# Patient Record
Sex: Male | Born: 1950
Health system: Southern US, Community
[De-identification: ages and names within clinical notes are randomized; demographics above are authoritative.]

## PROBLEM LIST (undated history)

## (undated) DIAGNOSIS — E870 Hyperosmolality and hypernatremia: Secondary | ICD-10-CM

## (undated) DIAGNOSIS — N401 Enlarged prostate with lower urinary tract symptoms: Secondary | ICD-10-CM

## (undated) DIAGNOSIS — E876 Hypokalemia: Secondary | ICD-10-CM

## (undated) DIAGNOSIS — E785 Hyperlipidemia, unspecified: Secondary | ICD-10-CM

## (undated) DIAGNOSIS — N200 Calculus of kidney: Secondary | ICD-10-CM

## (undated) DIAGNOSIS — H3530 Unspecified macular degeneration: Secondary | ICD-10-CM

## (undated) DIAGNOSIS — Z87442 Personal history of urinary calculi: Secondary | ICD-10-CM

## (undated) DIAGNOSIS — Q644 Malformation of urachus: Secondary | ICD-10-CM

## (undated) DIAGNOSIS — B029 Zoster without complications: Secondary | ICD-10-CM

## (undated) DIAGNOSIS — N138 Other obstructive and reflux uropathy: Secondary | ICD-10-CM

## (undated) DIAGNOSIS — E43 Unspecified severe protein-calorie malnutrition: Secondary | ICD-10-CM

## (undated) DIAGNOSIS — F32A Depression, unspecified: Secondary | ICD-10-CM

## (undated) DIAGNOSIS — N1330 Unspecified hydronephrosis: Secondary | ICD-10-CM

## (undated) DIAGNOSIS — D696 Thrombocytopenia, unspecified: Secondary | ICD-10-CM

## (undated) DIAGNOSIS — G039 Meningitis, unspecified: Secondary | ICD-10-CM

## (undated) DIAGNOSIS — F429 Obsessive-compulsive disorder, unspecified: Secondary | ICD-10-CM

## (undated) DIAGNOSIS — E119 Type 2 diabetes mellitus without complications: Secondary | ICD-10-CM

## (undated) HISTORY — DX: Obsessive-compulsive disorder, unspecified: F42.9

## (undated) HISTORY — DX: Zoster without complications: B02.9

## (undated) HISTORY — DX: Calculus of kidney: N20.0

## (undated) HISTORY — DX: Meningitis, unspecified: G03.9

## (undated) HISTORY — PX: KIDNEY STONE SURGERY: SHX686

---

## 2004-01-11 ENCOUNTER — Other Ambulatory Visit: Payer: Self-pay

## 2013-12-01 ENCOUNTER — Observation Stay: Payer: Self-pay | Admitting: Internal Medicine

## 2013-12-01 LAB — BASIC METABOLIC PANEL
ANION GAP: 6 — AB (ref 7–16)
BUN: 16 mg/dL (ref 7–18)
CALCIUM: 8.8 mg/dL (ref 8.5–10.1)
Chloride: 106 mmol/L (ref 98–107)
Co2: 27 mmol/L (ref 21–32)
Creatinine: 1.17 mg/dL (ref 0.60–1.30)
EGFR (African American): >60
Glucose: 146 mg/dL — ABNORMAL HIGH (ref 65–99)
Osmolality: 281 (ref 275–301)
Potassium: 3.7 mmol/L (ref 3.5–5.1)
Sodium: 139 mmol/L (ref 136–145)

## 2013-12-01 LAB — CBC
HCT: 47.6 % (ref 40.0–52.0)
HGB: 16.7 g/dL (ref 13.0–18.0)
MCH: 32 pg (ref 26.0–34.0)
MCHC: 35.2 g/dL (ref 32.0–36.0)
MCV: 91 fL (ref 80–100)
PLATELETS: 138 10*3/uL — AB (ref 150–440)
RBC: 5.23 10*6/uL (ref 4.40–5.90)
RDW: 13.2 % (ref 11.5–14.5)
WBC: 7.9 10*3/uL (ref 3.8–10.6)
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
## 2015-08-04 ENCOUNTER — Ambulatory Visit: Payer: Self-pay | Admitting: Unknown Physician Specialty

## 2015-09-07 ENCOUNTER — Ambulatory Visit (INDEPENDENT_AMBULATORY_CARE_PROVIDER_SITE_OTHER): Payer: No Typology Code available for payment source | Admitting: Unknown Physician Specialty

## 2015-09-07 ENCOUNTER — Encounter: Payer: Self-pay | Admitting: Unknown Physician Specialty

## 2015-09-07 VITALS — BP 135/91 | HR 90 | Temp 97.3°F | Ht 69.4 in | Wt 206.8 lb

## 2015-09-07 DIAGNOSIS — G4700 Insomnia, unspecified: Secondary | ICD-10-CM | POA: Diagnosis not present

## 2015-09-07 MED ORDER — TRAZODONE HCL 50 MG PO TABS: 25.0000 mg | ORAL_TABLET | Freq: Every evening | ORAL | Status: DC | PRN

## 2015-09-07 NOTE — Assessment & Plan Note (Signed)
Pt states his biggest issue is lack of sleep.  Therefore, we will prescribe Trazadone 50 mg to start at 1/2 QHS and may increase to 50 mg.

## 2015-09-07 NOTE — Progress Notes (Signed)
BP 135/91 mmHg  Pulse 90  Temp(Src) 97.3 F (36.3 C)  Ht 5' 9.4" (1.763 m)  Wt 206 lb 12.8 oz (93.804 kg)  BMI 30.18 kg/m2  SpO2 96%   Subjective:    Patient ID: Dillon [name], male    DOB: May 13, 1951, 64 y.o.   MRN: [id number]  HPI: Dillon [name] is a 64 y.o. male  Chief Complaint  Patient presents with  . Establish Care   Pt is here as a "new patient" as he "let my time run out."  He is having some issues with depression and states he has OCD.  He is struggling with grief issues related to his wife recently passing.  Review of previous notes show that he was taking Citalopram daily 20 mgs.  He has taken Clonazepam in the past for anxiety attacks.  He has the occasional anxiety attack. He would like to be prescribed that for occasional use.    Depression screen PHQ 2/9 09/07/2015  Decreased Interest 3  Down, Depressed, Hopeless 2  PHQ - 2 Score 5  Altered sleeping 3  Tired, decreased energy 3  Change in appetite 0  Feeling bad or failure about yourself  2  Trouble concentrating 0  Moving slowly or fidgety/restless 0  Suicidal thoughts 0  PHQ-9 Score 13     Relevant past medical, surgical, family and social history reviewed and updated as indicated. Interim medical history since our last visit reviewed. Allergies and medications reviewed and updated.  Review of Systems  Constitutional: Negative.   HENT: Negative.   Eyes: Negative.   Respiratory: Negative.   Cardiovascular: Negative.   Gastrointestinal: Negative.   Endocrine: Negative.   Genitourinary: Negative.   Skin: Negative.   Allergic/Immunologic: Negative.   Neurological: Negative.   Hematological: Negative.   Psychiatric/Behavioral: Positive for sleep disturbance.    Per HPI unless specifically indicated above     Objective:    BP 135/91 mmHg  Pulse 90  Temp(Src) 97.3 F (36.3 C)  Ht 5' 9.4" (1.763 m)  Wt 206 lb 12.8 oz (93.804 kg)  BMI 30.18 kg/m2  SpO2 96%  Wt Readings from Last 3  Encounters:  09/07/15 206 lb 12.8 oz (93.804 kg)    Physical Exam  Constitutional: He is oriented to person, place, and time. He appears well-developed and well-nourished. No distress.  HENT:  Head: Normocephalic and atraumatic.  Eyes: Conjunctivae and lids are normal. Right eye exhibits no discharge. Left eye exhibits no discharge. No scleral icterus.  Cardiovascular: Normal rate, regular rhythm and normal heart sounds.   Pulmonary/Chest: Effort normal and breath sounds normal. No respiratory distress.  Abdominal: Normal appearance. He exhibits no distension. There is no splenomegaly or hepatomegaly. There is no tenderness.  Musculoskeletal: Normal range of motion.  Neurological: He is alert and oriented to person, place, and time.  Skin: Skin is intact. No rash noted. No pallor.  Psychiatric: He has a normal mood and affect. His behavior is normal. Judgment and thought content normal.  Nursing note and vitals reviewed.    Assessment & Plan:   Problem List Items Addressed This Visit      Unprioritized   Insomnia - Primary    Pt states his biggest issue is lack of sleep.  Therefore, we will prescribe Trazadone 50 mg to start at 1/2 QHS and may increase to 50 mg.          Refusing physical for now   Follow up plan: Return in about 3  months (around 12/08/2015).

## 2015-09-18 ENCOUNTER — Ambulatory Visit: Payer: No Typology Code available for payment source | Admitting: Unknown Physician Specialty

## 2015-10-23 DIAGNOSIS — N200 Calculus of kidney: Secondary | ICD-10-CM | POA: Insufficient documentation

## 2015-10-23 DIAGNOSIS — N23 Unspecified renal colic: Secondary | ICD-10-CM | POA: Insufficient documentation

## 2015-10-23 DIAGNOSIS — N201 Calculus of ureter: Secondary | ICD-10-CM | POA: Insufficient documentation

## 2015-10-23 DIAGNOSIS — Q644 Malformation of urachus: Secondary | ICD-10-CM | POA: Insufficient documentation

## 2015-10-23 DIAGNOSIS — R310 Gross hematuria: Secondary | ICD-10-CM | POA: Insufficient documentation

## 2015-10-25 DIAGNOSIS — N210 Calculus in bladder: Secondary | ICD-10-CM | POA: Insufficient documentation

## 2015-11-13 DIAGNOSIS — R600 Localized edema: Secondary | ICD-10-CM | POA: Insufficient documentation

## 2015-11-24 DIAGNOSIS — N401 Enlarged prostate with lower urinary tract symptoms: Secondary | ICD-10-CM

## 2015-11-24 DIAGNOSIS — N138 Other obstructive and reflux uropathy: Secondary | ICD-10-CM | POA: Insufficient documentation

## 2015-11-24 DIAGNOSIS — R6881 Early satiety: Secondary | ICD-10-CM | POA: Insufficient documentation

## 2015-11-25 ENCOUNTER — Telehealth: Payer: Self-pay | Admitting: Gastroenterology

## 2015-11-25 NOTE — Telephone Encounter (Signed)
Patient needs an appointment with Dr. Servando Snare for early satiety, bowel changes, notes under media. Patient has [employer] Blue value so let patient know he needs to contact insurance company to see if Dr. Servando Snare is under his plan

## 2015-11-29 ENCOUNTER — Emergency Department
Admission: EM | Admit: 2015-11-29 | Discharge: 2015-11-29 | Disposition: A | Discharge status: Home / Self Care | Payer: BLUE CROSS/BLUE SHIELD | Attending: Emergency Medicine | Admitting: Emergency Medicine

## 2015-11-29 ENCOUNTER — Encounter: Payer: Self-pay | Admitting: *Deleted

## 2015-11-29 DIAGNOSIS — R630 Anorexia: Secondary | ICD-10-CM | POA: Insufficient documentation

## 2015-11-29 DIAGNOSIS — R634 Abnormal weight loss: Secondary | ICD-10-CM | POA: Diagnosis not present

## 2015-11-29 DIAGNOSIS — F4320 Adjustment disorder, unspecified: Secondary | ICD-10-CM | POA: Diagnosis not present

## 2015-11-29 DIAGNOSIS — F329 Major depressive disorder, single episode, unspecified: Secondary | ICD-10-CM | POA: Diagnosis not present

## 2015-11-29 DIAGNOSIS — F410 Panic disorder [episodic paroxysmal anxiety] without agoraphobia: Secondary | ICD-10-CM | POA: Insufficient documentation

## 2015-11-29 DIAGNOSIS — F4329 Adjustment disorder with other symptoms: Secondary | ICD-10-CM

## 2015-11-29 DIAGNOSIS — F419 Anxiety disorder, unspecified: Secondary | ICD-10-CM | POA: Diagnosis present

## 2015-11-29 DIAGNOSIS — F4381 Prolonged grief disorder: Secondary | ICD-10-CM

## 2015-11-29 DIAGNOSIS — F429 Obsessive-compulsive disorder, unspecified: Secondary | ICD-10-CM | POA: Diagnosis not present

## 2015-11-29 DIAGNOSIS — F32A Depression, unspecified: Secondary | ICD-10-CM

## 2015-11-29 LAB — GLUCOSE, CAPILLARY: Glucose-Capillary: 110 mg/dL — ABNORMAL HIGH (ref 65–99)

## 2015-11-29 LAB — URINALYSIS COMPLETE WITH MICROSCOPIC (ARMC ONLY)
BILIRUBIN URINE: NEGATIVE
Bacteria, UA: NONE SEEN
GLUCOSE, UA: NEGATIVE mg/dL
LEUKOCYTES UA: NEGATIVE
NITRITE: NEGATIVE
PH: 7 (ref 5.0–8.0)
Protein, ur: NEGATIVE mg/dL
SPECIFIC GRAVITY, URINE: 1.005 (ref 1.005–1.030)
Squamous Epithelial / LPF: NONE SEEN

## 2015-11-29 LAB — COMPREHENSIVE METABOLIC PANEL
ALT: 29 U/L (ref 17–63)
ANION GAP: 10 (ref 5–15)
AST: 26 U/L (ref 15–41)
Albumin: 4.5 g/dL (ref 3.5–5.0)
Alkaline Phosphatase: 83 U/L (ref 38–126)
BILIRUBIN TOTAL: 1.9 mg/dL — AB (ref 0.3–1.2)
BUN: 12 mg/dL (ref 6–20)
CHLORIDE: 107 mmol/L (ref 101–111)
CO2: 24 mmol/L (ref 22–32)
Calcium: 9.9 mg/dL (ref 8.9–10.3)
Creatinine, Ser: 1.09 mg/dL (ref 0.61–1.24)
GFR calc Af Amer: >60 mL/min (ref >60)
Glucose, Bld: 129 mg/dL — ABNORMAL HIGH (ref 65–99)
POTASSIUM: 2.9 mmol/L — AB (ref 3.5–5.1)
Sodium: 141 mmol/L (ref 135–145)
TOTAL PROTEIN: 6.9 g/dL (ref 6.5–8.1)

## 2015-11-29 LAB — CBC
HEMATOCRIT: 49 % (ref 40.0–52.0)
HEMOGLOBIN: 17.3 g/dL (ref 13.0–18.0)
MCH: 31.1 pg (ref 26.0–34.0)
MCHC: 35.4 g/dL (ref 32.0–36.0)
MCV: 87.9 fL (ref 80.0–100.0)
PLATELETS: 174 10*3/uL (ref 150–440)
RBC: 5.58 MIL/uL (ref 4.40–5.90)
RDW: 13.5 % (ref 11.5–14.5)
WBC: 9.8 10*3/uL (ref 3.8–10.6)

## 2015-11-29 LAB — LIPASE, BLOOD: LIPASE: 23 U/L (ref 11–51)

## 2015-11-29 MED ORDER — POTASSIUM CHLORIDE CRYS ER 20 MEQ PO TBCR
40.0000 meq | EXTENDED_RELEASE_TABLET | Freq: Once | ORAL | Status: AC
  Administered 2015-11-29: 40 meq via ORAL

## 2015-11-29 MED ORDER — LORAZEPAM 1 MG PO TABS
1.0000 mg | ORAL_TABLET | Freq: Once | ORAL | Status: AC
  Administered 2015-11-29: 1 mg via ORAL
  Filled 2015-11-29: qty 1

## 2015-11-29 MED ORDER — LORAZEPAM 1 MG PO TABS: 1.0000 mg | ORAL_TABLET | Freq: Three times a day (TID) | ORAL | Status: DC | PRN

## 2015-11-29 MED ORDER — POTASSIUM CHLORIDE CRYS ER 20 MEQ PO TBCR
EXTENDED_RELEASE_TABLET | ORAL | Status: AC
  Administered 2015-11-29: 40 meq via ORAL
  Filled 2015-11-29: qty 2

## 2015-11-29 NOTE — ED Provider Notes (Signed)
[facility] Emergency Department Provider Note  ____________________________________________  Time seen: Approximately 7:30 AM  I have reviewed the triage vital signs and the nursing notes.   HISTORY  Chief Complaint Depression, Grief, Anxiety    HPI Dillon [name] is a 65 y.o. male with a history of generalized anxiety, OCD, and depression presenting with progressively worsening psychiatric symptoms since the death of his wife in 02-27-2023. Patient reports that over the last several months he has had decreased appetite with associated 40 pound weight loss, difficulty with sleep, severe panic attacks, increasing OCD symptoms.  He denies any SI, HI, or hallucinations. He states that he was previously stable on Klonopin, but his primary care physician has recently changed and his new primary care physician does not want to prescribe this medication for him.  After a long discussion, the patient denies any new medical complaints. He has recently been treated for renal stones and all of his symptoms have significantly improved or resolved.   Past Medical History  Diagnosis Date  . Meningitis     spinal    Patient Active Problem List   Diagnosis Date Noted  . Insomnia 09/07/2015    Past Surgical History  Procedure Laterality Date  . Kidney stone surgery      Current Outpatient Rx  Name  Route  Sig  Dispense  Refill  . LORazepam (ATIVAN) 1 MG tablet   Oral   Take 1 tablet (1 mg total) by mouth every 8 (eight) hours as needed for anxiety.   6 tablet   0   . traZODone (DESYREL) 50 MG tablet   Oral   Take 0.5-1 tablets (25-50 mg total) by mouth at bedtime as needed for sleep.   30 tablet   1     Allergies Review of patient's allergies indicates no known allergies.  Family History  Problem Relation Age of Onset  . Cancer Mother     bladder  . Emphysema Father   . Emphysema Sister   . COPD Sister   . Cancer Brother     prostate    Social  History Social History  Substance Use Topics  . Smoking status: Never Smoker   . Smokeless tobacco: Never Used  . Alcohol Use: No    Review of Systems Constitutional: No fever/chills. No lightheadedness or syncope. Positive weight loss. Positive sleep disturbance. Eyes: No visual changes. ENT: No sore throat. Cardiovascular: Denies chest pain, palpitations. Respiratory: Denies shortness of breath.  No cough. Gastrointestinal: No abdominal pain.  No nausea, no vomiting.  No diarrhea.  No constipation. Genitourinary: Negative for dysuria. Musculoskeletal: Negative for back pain. Skin: Negative for rash. Neurological: Negative for headaches, focal weakness or numbness. Psychiatric:Positive feelings of grief and sadness. Positive frequent panic attacks. Positive sleep disturbance. Positive anorexia. 10-point ROS otherwise negative.  ____________________________________________   PHYSICAL EXAM:  VITAL SIGNS: ED Triage Vitals  Enc Vitals Group     BP 11/29/15 0530 134/84 mmHg     Pulse Rate 11/29/15 0530 78     Resp --      Temp --      Temp src --      SpO2 11/29/15 0530 100 %     Weight --      Height --      Head Cir --      Peak Flow --      Pain Score 11/29/15 0459 3     Pain Loc --  Pain Edu? --      Excl. in GC? --     Constitutional: Patient is alert and oriented and answering questions appropriately. He is exhibiting good judgment.  Eyes: Conjunctivae are normal.  EOMI. no scleral icterus. Head: Atraumatic. Nose: No congestion/rhinnorhea. Mouth/Throat: Mucous membranes are moist.  Neck: No stridor.  Supple.   Cardiovascular: Normal rate, regular rhythm. No murmurs, rubs or gallops.  Respiratory: Normal respiratory effort.  No retractions. Lungs CTAB.  No wheezes, rales or ronchi. Gastrointestinal: Soft and nontender. No distention. No peritoneal signs. No CVA tenderness bilaterally. Musculoskeletal: No LE edema.  Neurologic:  Normal speech and language.  No gross focal neurologic deficits are appreciated.  Skin:  Skin is warm, dry and intact. No rash noted. Psychiatric: Mood is depressed and affect is anxious. The patient is exhibiting good insight into his symptoms and the cause of his symptoms, and has good judgment.  ____________________________________________   LABS (all labs ordered are listed, but only abnormal results are displayed)  Labs Reviewed  COMPREHENSIVE METABOLIC PANEL - Abnormal; Notable for the following:    Potassium 2.9 (*)    Glucose, Bld 129 (*)    Total Bilirubin 1.9 (*)    All other components within normal limits  URINALYSIS COMPLETEWITH MICROSCOPIC ([facility] ONLY) - Abnormal; Notable for the following:    Color, Urine YELLOW (*)    APPearance CLEAR (*)    Ketones, ur TRACE (*)    Hgb urine dipstick 3+ (*)    All other components within normal limits  GLUCOSE, CAPILLARY - Abnormal; Notable for the following:    Glucose-Capillary 110 (*)    All other components within normal limits  LIPASE, BLOOD  CBC  CBG MONITORING, ED   ____________________________________________  EKG  ED ECG REPORT I, Rockne Menghini, the attending physician, personally viewed and interpreted this ECG.   Date: 11/29/2015  EKG Time: 502  Rate: 95  Rhythm: normal sinus rhythm  Axis: Normal  Intervals:none  ST&T Change: No ST elevation. No ischemic changes.  ____________________________________________  RADIOLOGY  No results found.  ____________________________________________   PROCEDURES  Procedure(s) performed: None  Critical Care performed: No ____________________________________________   INITIAL IMPRESSION / ASSESSMENT AND PLAN / ED COURSE  Pertinent labs & imaging results that were available during my care of the patient were reviewed by me and considered in my medical decision making (see chart for details).  65 y.o. male with a long history of multiple psychiatric diagnoses who is currently  untreated and exhibiting signs and symptoms consistent with depression, anxiety, and OCD in the setting of the death of his wife 9 months ago. He has no new symptoms today, no symptoms that would warrant inpatient hospitalization for psychiatric care. It is imperative that he become reestablished with a primary care physician as well as a psychiatrist who can help him with his symptoms. He has good family support, and is accompanied by his daughter today who will continue to care for him. I have asked the psychiatric nurse to come speak with the patient about the local resources available to him in [place]. We had a long discussion about return precautions as well as follow-up instructions.  ____________________________________________  FINAL CLINICAL IMPRESSION(S) / ED DIAGNOSES  Final diagnoses:  Depression  Prolonged grief reaction  OCD (obsessive compulsive disorder)  Panic attacks      NEW MEDICATIONS STARTED DURING THIS VISIT:  Discharge Medication List as of 11/29/2015  8:05 AM    START taking these medications  Details  LORazepam (ATIVAN) 1 MG tablet Take 1 tablet (1 mg total) by mouth every 8 (eight) hours as needed for anxiety., Starting 11/29/2015, Until Mon 11/28/16, Print         Rockne Menghini, MD 11/29/15 1318

## 2015-11-29 NOTE — ED Notes (Signed)
Pt. States weakness below the waist.  Pt. States his anxiety is up.  Pt. Daughter confirms pt. Has had high levels of anxiety from the passing of his wife last year.  Pt. Had surgery last month to remove kidney stones from rt. Side.  Pt. And pt. Daughter state pt. Does not currently have PCP.  Pt. States he was taking anxiety medication last year but prescription expired.

## 2015-11-29 NOTE — ED Notes (Signed)
Pt. States he has had decreased appetite in the past year.  Pt. States he has lost about 40lbs in the last year.

## 2015-11-29 NOTE — ED Notes (Signed)
Norman MD at bedside 

## 2015-11-29 NOTE — Discharge Instructions (Signed)
Please make an appointment to establish psychiatric care as outlined in the emergency department. Please also make an appoint with your primary care physician to continue care for your illnesses.  Please return to the emergency department if you develop thoughts of hurting herself or hurting anyone else, hallucinations, or any other symptoms concerning to you.

## 2015-11-29 NOTE — ED Notes (Signed)
Report received from Matt RN  

## 2015-11-29 NOTE — ED Notes (Signed)
Pt presents w/ c/o ongoing weakness for several months and more recent c/o R flank pain, intermittent, x 3-4 hours prior to arrival. Pt's daughter reports pt w/ extremely high levels of anxiety since wife's recent death.

## 2015-11-29 NOTE — ED Notes (Signed)
Informed ED doctor of critical potassium result of 2.9.

## 2015-11-29 NOTE — BH Assessment (Signed)
Discussed patient with ER MD (Dr. Sharma Covert) and patient is able to discharge home when medically cleared. Patient was giving referral information and instructions on how to follow up with Outpatient Treatment (Contacting [employer], [employer] and [employer]) and [employer].   Patient denies SI/HI and AV/H.

## 2015-12-01 ENCOUNTER — Encounter: Payer: Self-pay | Admitting: Unknown Physician Specialty

## 2015-12-01 ENCOUNTER — Ambulatory Visit (INDEPENDENT_AMBULATORY_CARE_PROVIDER_SITE_OTHER): Payer: BLUE CROSS/BLUE SHIELD | Admitting: Unknown Physician Specialty

## 2015-12-01 VITALS — BP 114/78 | HR 62 | Temp 97.7°F | Ht 68.5 in | Wt 182.2 lb

## 2015-12-01 DIAGNOSIS — G4700 Insomnia, unspecified: Secondary | ICD-10-CM | POA: Diagnosis not present

## 2015-12-01 DIAGNOSIS — H9201 Otalgia, right ear: Secondary | ICD-10-CM

## 2015-12-01 DIAGNOSIS — F329 Major depressive disorder, single episode, unspecified: Secondary | ICD-10-CM

## 2015-12-01 DIAGNOSIS — E876 Hypokalemia: Secondary | ICD-10-CM | POA: Diagnosis not present

## 2015-12-01 DIAGNOSIS — F32A Depression, unspecified: Secondary | ICD-10-CM

## 2015-12-01 DIAGNOSIS — H9209 Otalgia, unspecified ear: Secondary | ICD-10-CM | POA: Insufficient documentation

## 2015-12-01 MED ORDER — MIRTAZAPINE 15 MG PO TABS: 15.0000 mg | ORAL_TABLET | Freq: Every day | ORAL | Status: DC

## 2015-12-01 MED ORDER — LORAZEPAM 0.5 MG PO TABS: 0.5000 mg | ORAL_TABLET | Freq: Two times a day (BID) | ORAL | Status: DC | PRN

## 2015-12-01 NOTE — Assessment & Plan Note (Signed)
Reviewed labs from 11/29/15 ER visit. Noted serum potassium at 2.9. Pt given dietary suggestions to increase potassium intake. Will re-check serum potassium at 2 week f/u.

## 2015-12-01 NOTE — Assessment & Plan Note (Signed)
No erythema, tenderness, or effusion noted. If symptoms persists, pt instructed to notify provider at 2 week f/u for re-evaluation.

## 2015-12-01 NOTE — Progress Notes (Signed)
BP 114/78 mmHg  Pulse 62  Temp(Src) 97.7 F (36.5 C)  Ht 5' 8.5" (1.74 m)  Wt 182 lb 3.2 oz (82.645 kg)  BMI 27.30 kg/m2  SpO2 99%   Subjective:    Patient ID: Dillon [name], male    DOB: [birth year], 65 y.o.   MRN: [id number]  HPI: [name] is a 65 y.o. male  Chief Complaint  Patient presents with  . Medication Refill    pt states he needs a refill on lorazepam  . Anxiety  . Ear Pain    pt states he wants to have his right ear looked at  Depression: pt reports he was seen in the ER on 11/29/15, for increased depression from death of wife in 02/19/23, medical complications with kidney stones and 30-40 lb weight loss. Pt reports feeling weak, no energy, and fatigued with loss of appetite. Pt states that he has been having "overwhelming loss and was not prepared". Pt c/o insomnia and states when he at home at night, he has fear of the "darkness". Pt states that he has spends the night pacing. Pt has been taking lorazepam 0.5 mg at night, which has helped him over the last two days. Pt was given a referral for psych f/u ([facility]); pt has not f/u at this time. Pt reports being sent home form work early, yesterday, due his loss of energy and fatigue. Pt denies SI at this time. ER notes reviewed  Depression screen [facility] 2/9 12/01/2015 09/07/2015  Decreased Interest 3 3  Down, Depressed, Hopeless 3 2  PHQ - 2 Score 6 5  Altered sleeping 3 3  Tired, decreased energy 3 3  Change in appetite 3 0  Feeling bad or failure about yourself  3 2  Trouble concentrating 2 0  Moving slowly or fidgety/restless 3 0  Suicidal thoughts 0 0  PHQ-9 Score 23 13   Ear pain:  Pt c/o a popping sensation in his right ear and concerned for ear congestion. Pt denies pain and fevers at home. Pt reports mild changes in hearing d/t congestion. But, denies hearing loss.   Relevant past medical, surgical, family and social history reviewed and updated as indicated. Interim medical history  since our last visit reviewed. Allergies and medications reviewed and updated.  Review of Systems  Constitutional: Positive for fever, activity change, appetite change and fatigue.  HENT: Positive for ear pain. Negative for congestion, ear discharge, hearing loss, rhinorrhea and sinus pressure.   Respiratory: Negative.   Cardiovascular: Negative.   Neurological: Negative.   Psychiatric/Behavioral: Positive for sleep disturbance and decreased concentration. Negative for suicidal ideas, behavioral problems, confusion and agitation. The patient is nervous/anxious.     Per HPI unless specifically indicated above     Objective:    BP 114/78 mmHg  Pulse 62  Temp(Src) 97.7 F (36.5 C)  Ht 5' 8.5" (1.74 m)  Wt 182 lb 3.2 oz (82.645 kg)  BMI 27.30 kg/m2  SpO2 99%  Wt Readings from Last 3 Encounters:  12/01/15 182 lb 3.2 oz (82.645 kg)  09/07/15 206 lb 12.8 oz (93.804 kg)    Physical Exam  Constitutional: He is oriented to person, place, and time. He appears well-developed and well-nourished. No distress.  HENT:  Head: Normocephalic and atraumatic.  Right Ear: Tympanic membrane and ear canal normal. No drainage or tenderness. Tympanic membrane is not erythematous. No decreased hearing is noted.  Left Ear: Tympanic membrane and ear canal normal. No drainage or  tenderness. Tympanic membrane is not erythematous. No decreased hearing is noted.  Eyes: Conjunctivae and lids are normal. Right eye exhibits no discharge. Left eye exhibits no discharge. No scleral icterus.  Neck: Normal range of motion. Neck supple. No JVD present. Carotid bruit is not present.  Cardiovascular: Normal rate, regular rhythm and normal heart sounds.   Pulmonary/Chest: Effort normal and breath sounds normal. No respiratory distress.  Abdominal: Normal appearance. There is no splenomegaly or hepatomegaly.  Musculoskeletal: Normal range of motion.  Neurological: He is alert and oriented to person, place, and time.   Skin: Skin is warm, dry and intact. No rash noted. No pallor.  Psychiatric: He has a normal mood and affect. His behavior is normal. Judgment and thought content normal.    Results for orders placed or performed during the hospital encounter of 11/29/15  Lipase, blood  Result Value Ref Range   Lipase 23 11 - 51 U/L  Comprehensive metabolic panel  Result Value Ref Range   Sodium 141 135 - 145 mmol/L   Potassium 2.9 (LL) 3.5 - 5.1 mmol/L   Chloride 107 101 - 111 mmol/L   CO2 24 22 - 32 mmol/L   Glucose, Bld 129 (H) 65 - 99 mg/dL   BUN 12 6 - 20 mg/dL   Creatinine, Ser 1.61 0.61 - 1.24 mg/dL   Calcium 9.9 8.9 - 09.6 mg/dL   Total Protein 6.9 6.5 - 8.1 g/dL   Albumin 4.5 3.5 - 5.0 g/dL   AST 26 15 - 41 U/L   ALT 29 17 - 63 U/L   Alkaline Phosphatase 83 38 - 126 U/L   Total Bilirubin 1.9 (H) 0.3 - 1.2 mg/dL   GFR calc non Af Amer >60 >60 mL/min   GFR calc Af Amer >60 >60 mL/min   Anion gap 10 5 - 15  CBC  Result Value Ref Range   WBC 9.8 3.8 - 10.6 K/uL   RBC 5.58 4.40 - 5.90 MIL/uL   Hemoglobin 17.3 13.0 - 18.0 g/dL   HCT 04.5 40.9 - 81.1 %   MCV 87.9 80.0 - 100.0 fL   MCH 31.1 26.0 - 34.0 pg   MCHC 35.4 32.0 - 36.0 g/dL   RDW 91.4 78.2 - 95.6 %   Platelets 174 150 - 440 K/uL  Urinalysis complete, with microscopic ([facility] only)  Result Value Ref Range   Color, Urine YELLOW (A) YELLOW   APPearance CLEAR (A) CLEAR   Glucose, UA NEGATIVE NEGATIVE mg/dL   Bilirubin Urine NEGATIVE NEGATIVE   Ketones, ur TRACE (A) NEGATIVE mg/dL   Specific Gravity, Urine 1.005 1.005 - 1.030   Hgb urine dipstick 3+ (A) NEGATIVE   pH 7.0 5.0 - 8.0   Protein, ur NEGATIVE NEGATIVE mg/dL   Nitrite NEGATIVE NEGATIVE   Leukocytes, UA NEGATIVE NEGATIVE   RBC / HPF 6-30 0 - 5 RBC/hpf   WBC, UA 0-5 0 - 5 WBC/hpf   Bacteria, UA NONE SEEN NONE SEEN   Squamous Epithelial / LPF NONE SEEN NONE SEEN   Mucous PRESENT   Glucose, capillary  Result Value Ref Range   Glucose-Capillary 110 (H) 65 - 99 mg/dL       Assessment & Plan:   Problem List Items Addressed This Visit      Unprioritized   Insomnia    Pt is taking Lorazepam at night, with noted improvement. Pt given Remeron for depression and anxiety, as well as insomnia. Pt instructed to f/u in 2 weeks for re-evaluation.  Depression - Primary    Reviewed ER noted from 11/29/15; pt taking Lorazepam. Pt has a PHQ 9 score of 23, today; pt denies SI at this time. Pt given Remeron for increased depression. F/u in 2 weeks for re-evaluation.       Relevant Medications   mirtazapine (REMERON) 15 MG tablet   LORazepam (ATIVAN) 0.5 MG tablet   Ear pain    No erythema, tenderness, or effusion noted. If symptoms persists, pt instructed to notify provider at 2 week f/u for re-evaluation.        Low serum potassium level    Reviewed labs from 11/29/15 ER visit. Noted serum potassium at 2.9. Pt given dietary suggestions to increase potassium intake. Will re-check serum potassium at 2 week f/u.          Written instructions given:  1. Please try to follow-up with [employer] for your depression and anxiety, as recommended.   2. Take REMERON 15 mg tablet by  mouth at bedtime. The medication will help you with your insomnia, depression, anxiety, and increase your appetite.   3. Take LORAZEPAM 0.5 mg tablet by mouth at twice daily, for anxiety. You can take one tablet in the morning and one tablet at night, as needed.   4. Try to increase your intake of food. Bananas and green leafy vegetables with help increase your potassium in your body. At your 2 week follow-up, we can recheck your potassium levels. If you lack an appetite, try drinking an ensure or boost.    5. We would like to see you in 2 weeks for a re-evaluation of your symptoms and how well you are tolerating your medications.   Follow up plan: Return in about 2 weeks (around 12/15/2015).

## 2015-12-01 NOTE — Patient Instructions (Addendum)
Dillon [name] below is written the instructions for medication schedule and depression plan of care.  1. Please try to follow-up with [employer] for your depression and anxiety, as recommended.   2. Take REMERON 15 mg tablet by  mouth at bedtime. The medication will help you with your insomnia, depression, anxiety, and increase your appetite.   3. Take LORAZEPAM 0.5 mg tablet by mouth at twice daily, for anxiety. You can take one tablet in the morning and one tablet at night, as needed. If you lack an appetite, try drinking an ensure or boost.    4. Try to increase your intake of food. Bananas and green leafy vegetables with help increase your potassium in your body. At your 2 week follow-up, we can recheck your potassium levels.  5. We would like to see you in 2 weeks for a re-evaluation of your symptoms and how well you are tolerating your medications.

## 2015-12-01 NOTE — Assessment & Plan Note (Signed)
Pt is taking Lorazepam at night, with noted improvement. Pt given Remeron for depression and anxiety, as well as insomnia. Pt instructed to f/u in 2 weeks for re-evaluation.

## 2015-12-01 NOTE — Assessment & Plan Note (Addendum)
Reviewed ER noted from 11/29/15; pt taking Lorazepam. Pt has a PHQ 9 score of 23, today; pt denies SI at this time. Pt given Remeron for increased depression. F/u in 2 weeks for re-evaluation.

## 2015-12-02 NOTE — Telephone Encounter (Signed)
Left voice message with patients daughter, Tresa Endo, to call [employer] to make sure they will cover Dr. Annabell Sabal services.

## 2015-12-08 ENCOUNTER — Ambulatory Visit: Payer: No Typology Code available for payment source | Admitting: Unknown Physician Specialty

## 2015-12-15 ENCOUNTER — Inpatient Hospital Stay
Admit: 2015-12-15 | Discharge: 2015-12-21 | DRG: 885 | Disposition: A | Discharge status: Home / Self Care | Payer: BLUE CROSS/BLUE SHIELD | Source: Intra-hospital | Attending: Psychiatry | Admitting: Psychiatry

## 2015-12-15 ENCOUNTER — Encounter: Payer: Self-pay | Admitting: Emergency Medicine

## 2015-12-15 ENCOUNTER — Emergency Department
Admission: EM | Admit: 2015-12-15 | Discharge: 2015-12-15 | Disposition: A | Discharge status: Other Acute Inpatient Hospital | Payer: BLUE CROSS/BLUE SHIELD | Attending: Emergency Medicine | Admitting: Emergency Medicine

## 2015-12-15 ENCOUNTER — Ambulatory Visit (INDEPENDENT_AMBULATORY_CARE_PROVIDER_SITE_OTHER): Payer: BLUE CROSS/BLUE SHIELD | Admitting: Unknown Physician Specialty

## 2015-12-15 ENCOUNTER — Encounter: Payer: Self-pay | Admitting: Unknown Physician Specialty

## 2015-12-15 VITALS — BP 114/84 | HR 94 | Temp 98.5°F | Ht 67.7 in | Wt 168.4 lb

## 2015-12-15 DIAGNOSIS — F329 Major depressive disorder, single episode, unspecified: Secondary | ICD-10-CM | POA: Insufficient documentation

## 2015-12-15 DIAGNOSIS — N400 Enlarged prostate without lower urinary tract symptoms: Secondary | ICD-10-CM | POA: Diagnosis present

## 2015-12-15 DIAGNOSIS — F332 Major depressive disorder, recurrent severe without psychotic features: Secondary | ICD-10-CM

## 2015-12-15 DIAGNOSIS — F4321 Adjustment disorder with depressed mood: Secondary | ICD-10-CM | POA: Diagnosis present

## 2015-12-15 DIAGNOSIS — Z79899 Other long term (current) drug therapy: Secondary | ICD-10-CM | POA: Insufficient documentation

## 2015-12-15 DIAGNOSIS — F429 Obsessive-compulsive disorder, unspecified: Secondary | ICD-10-CM | POA: Diagnosis present

## 2015-12-15 DIAGNOSIS — R634 Abnormal weight loss: Secondary | ICD-10-CM | POA: Diagnosis present

## 2015-12-15 DIAGNOSIS — G4700 Insomnia, unspecified: Secondary | ICD-10-CM | POA: Diagnosis present

## 2015-12-15 DIAGNOSIS — F32A Depression, unspecified: Secondary | ICD-10-CM

## 2015-12-15 DIAGNOSIS — Z8661 Personal history of infections of the central nervous system: Secondary | ICD-10-CM

## 2015-12-15 DIAGNOSIS — R45851 Suicidal ideations: Secondary | ICD-10-CM | POA: Diagnosis present

## 2015-12-15 DIAGNOSIS — N401 Enlarged prostate with lower urinary tract symptoms: Secondary | ICD-10-CM | POA: Diagnosis present

## 2015-12-15 DIAGNOSIS — F325 Major depressive disorder, single episode, in full remission: Secondary | ICD-10-CM

## 2015-12-15 DIAGNOSIS — R4789 Other speech disturbances: Secondary | ICD-10-CM | POA: Diagnosis not present

## 2015-12-15 DIAGNOSIS — R627 Adult failure to thrive: Secondary | ICD-10-CM | POA: Diagnosis present

## 2015-12-15 DIAGNOSIS — F322 Major depressive disorder, single episode, severe without psychotic features: Secondary | ICD-10-CM

## 2015-12-15 DIAGNOSIS — N138 Other obstructive and reflux uropathy: Secondary | ICD-10-CM | POA: Diagnosis present

## 2015-12-15 LAB — COMPREHENSIVE METABOLIC PANEL
ALBUMIN: 5 g/dL (ref 3.5–5.0)
ALK PHOS: 109 U/L (ref 38–126)
ALT: 33 U/L (ref 17–63)
ANION GAP: 13 (ref 5–15)
AST: 23 U/L (ref 15–41)
BILIRUBIN TOTAL: 2.1 mg/dL — AB (ref 0.3–1.2)
BUN: 24 mg/dL — AB (ref 6–20)
CO2: 24 mmol/L (ref 22–32)
Calcium: 10.9 mg/dL — ABNORMAL HIGH (ref 8.9–10.3)
Chloride: 104 mmol/L (ref 101–111)
Creatinine, Ser: 1.56 mg/dL — ABNORMAL HIGH (ref 0.61–1.24)
GFR calc Af Amer: 53 mL/min — ABNORMAL LOW (ref >60)
GFR calc non Af Amer: 46 mL/min — ABNORMAL LOW (ref >60)
GLUCOSE: 111 mg/dL — AB (ref 65–99)
POTASSIUM: 4.9 mmol/L (ref 3.5–5.1)
SODIUM: 141 mmol/L (ref 135–145)
Total Protein: 7.6 g/dL (ref 6.5–8.1)

## 2015-12-15 LAB — URINE DRUG SCREEN, QUALITATIVE (ARMC ONLY)
AMPHETAMINES, UR SCREEN: NOT DETECTED
Barbiturates, Ur Screen: NOT DETECTED
Benzodiazepine, Ur Scrn: NOT DETECTED
COCAINE METABOLITE, UR ~~LOC~~: NOT DETECTED
Cannabinoid 50 Ng, Ur ~~LOC~~: NOT DETECTED
MDMA (ECSTASY) UR SCREEN: NOT DETECTED
METHADONE SCREEN, URINE: NOT DETECTED
Opiate, Ur Screen: NOT DETECTED
Phencyclidine (PCP) Ur S: NOT DETECTED
TRICYCLIC, UR SCREEN: NOT DETECTED

## 2015-12-15 LAB — CBC
HEMATOCRIT: 53.3 % — AB (ref 40.0–52.0)
HEMOGLOBIN: 19 g/dL — AB (ref 13.0–18.0)
MCH: 31.2 pg (ref 26.0–34.0)
MCHC: 35.7 g/dL (ref 32.0–36.0)
MCV: 87.6 fL (ref 80.0–100.0)
PLATELETS: 195 10*3/uL (ref 150–440)
RBC: 6.09 MIL/uL — ABNORMAL HIGH (ref 4.40–5.90)
RDW: 13.3 % (ref 11.5–14.5)
WBC: 10.2 10*3/uL (ref 3.8–10.6)

## 2015-12-15 LAB — ETHANOL: Alcohol, Ethyl (B): <5 mg/dL (ref <5)

## 2015-12-15 LAB — SALICYLATE LEVEL: Salicylate Lvl: <4 mg/dL (ref 2.8–30.0)

## 2015-12-15 LAB — ACETAMINOPHEN LEVEL

## 2015-12-15 MED ORDER — MECLIZINE HCL 25 MG PO TABS
ORAL_TABLET | ORAL | Status: AC
  Administered 2015-12-15: 25 mg via ORAL
  Filled 2015-12-15: qty 1

## 2015-12-15 MED ORDER — FLUOXETINE HCL 20 MG PO CAPS
20.0000 mg | ORAL_CAPSULE | Freq: Every day | ORAL | Status: DC
  Administered 2015-12-16: 20 mg via ORAL
  Filled 2015-12-15: qty 1

## 2015-12-15 MED ORDER — LORAZEPAM 1 MG PO TABS
1.0000 mg | ORAL_TABLET | Freq: Once | ORAL | Status: AC
  Administered 2015-12-15: 1 mg via ORAL
  Filled 2015-12-15: qty 1

## 2015-12-15 MED ORDER — MECLIZINE HCL 25 MG PO TABS
25.0000 mg | ORAL_TABLET | Freq: Once | ORAL | Status: AC
  Administered 2015-12-15: 25 mg via ORAL

## 2015-12-15 MED ORDER — ACETAMINOPHEN 325 MG PO TABS: 650.0000 mg | ORAL_TABLET | Freq: Four times a day (QID) | ORAL | Status: DC | PRN

## 2015-12-15 MED ORDER — FLUOXETINE HCL 20 MG PO CAPS: 20.0000 mg | ORAL_CAPSULE | Freq: Every day | ORAL | Status: DC

## 2015-12-15 MED ORDER — TAMSULOSIN HCL 0.4 MG PO CAPS
0.4000 mg | ORAL_CAPSULE | Freq: Every day | ORAL | Status: DC
  Administered 2015-12-15: 0.4 mg via ORAL
  Filled 2015-12-15: qty 1

## 2015-12-15 MED ORDER — MIRTAZAPINE 15 MG PO TABS
15.0000 mg | ORAL_TABLET | Freq: Once | ORAL | Status: DC
  Filled 2015-12-15: qty 1

## 2015-12-15 MED ORDER — TAMSULOSIN HCL 0.4 MG PO CAPS
0.4000 mg | ORAL_CAPSULE | Freq: Every day | ORAL | Status: DC
  Administered 2015-12-16 – 2015-12-20 (×5): 0.4 mg via ORAL
  Filled 2015-12-15 (×5): qty 1

## 2015-12-15 MED ORDER — MAGNESIUM HYDROXIDE 400 MG/5ML PO SUSP: 30.0000 mL | Freq: Every day | ORAL | Status: DC | PRN

## 2015-12-15 MED ORDER — ALUM & MAG HYDROXIDE-SIMETH 200-200-20 MG/5ML PO SUSP: 30.0000 mL | ORAL | Status: DC | PRN

## 2015-12-15 NOTE — ED Notes (Addendum)
Pt. Noted in room. No complaints or concerns voiced. No distress or abnormal behavior noted. Will continue to monitor with security cameras. Q 15 minute rounds continue. 

## 2015-12-15 NOTE — ED Notes (Signed)
Pt is awake sitting on side of bed this evening. Pt mood is sad/anxious and his affect is depressed. Pt complains of constantly being fearful with periods of severe anxiety. Pt is expressing hopelessness and feels all alone in the world since his wife died last 2023-03-15. Writer discussed plan of care including staff resources, inpatient care and after care planning. Pt thinking is somewhat concrete but acknowledges that we are doing all we can to assist him during this crisis. He is open to inpatient admission. Pt endorses passive SI but is able contract for safety at this time.  Evening snack provided and 15 minute checks are ongoing for safety.

## 2015-12-15 NOTE — ED Provider Notes (Signed)
[facility] Emergency Department Provider Note  ____________________________________________  Time seen: Approximately 01-Mar-1229 PM  I have reviewed the triage vital signs and the nursing notes.   HISTORY  Chief Complaint Depression    HPI Dillon [name] is a 65 y.o. male with a history of excessive compulsive disorder who is presenting today with depression from his primary care doctor's office. His wife died this past 03/02/2023 and ever since then he has been feeling tired with a poor appetite and feeling bad about himself. He has trouble constantly on things. No thoughts of suicide or homicide. Sent in by his primary care doctor for further evaluation. Also with dizziness when he gets up to stand. No dizziness while resting.   Past Medical History  Diagnosis Date  . Meningitis     spinal  . OCD (obsessive compulsive disorder)   . Kidney stones     Patient Active Problem List   Diagnosis Date Noted  . Severe depression 12/15/2015  . Severe recurrent major depression without psychotic features (HCC) 12/15/2015  . Grief 12/15/2015  . Prostate enlargement 12/15/2015  . Weight loss 12/15/2015  . Depression 12/01/2015  . Ear pain 12/01/2015  . Low serum potassium level 12/01/2015  . Benign prostatic hyperplasia with urinary obstruction 11/24/2015  . Diastasis recti 11/24/2015  . Early satiety 11/24/2015  . Incomplete bladder emptying 11/24/2015  . Edema leg 11/13/2015  . Bladder retention 11/10/2015  . Bladder calculi 10/25/2015  . Frank hematuria 10/23/2015  . Calculus of kidney 10/23/2015  . Renal colic 10/23/2015  . 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 Calculi, ureter 10/23/2015  . Insomnia 09/07/2015    Past Surgical History  Procedure Laterality Date  . Kidney stone surgery      Current Outpatient Rx  Name  Route  Sig  Dispense  Refill  . LORazepam (ATIVAN) 0.5 MG tablet   Oral   Take 1 tablet (0.5 mg total) by mouth 2 (two)  times daily as needed for anxiety.   30 tablet   1   . LORazepam (ATIVAN) 1 MG tablet   Oral   Take 1 tablet (1 mg total) by mouth every 8 (eight) hours as needed for anxiety.   6 tablet   0   . mirtazapine (REMERON) 15 MG tablet   Oral   Take 1 tablet (15 mg total) by mouth at bedtime.   30 tablet   1   . tamsulosin (FLOMAX) 0.4 MG CAPS capsule   Oral   Take 0.4 mg by mouth 2 (two) times daily.           Allergies Codeine  Family History  Problem Relation Age of Onset  . Cancer Mother     bladder  . Emphysema Father   . Emphysema Sister   . COPD Sister   . Cancer Brother     prostate    Social History Social History  Substance Use Topics  . Smoking status: Never Smoker   . Smokeless tobacco: Never Used  . Alcohol Use: No    Review of Systems Constitutional: No fever/chills Eyes: No visual changes. ENT: No sore throat. Cardiovascular: Denies chest pain. Respiratory: Denies shortness of breath. Gastrointestinal: No abdominal pain.  No nausea, no vomiting.  No diarrhea.  No constipation. Genitourinary: Negative for dysuria. Musculoskeletal: Negative for back pain. Skin: Negative for rash. Neurological: Negative for headaches, focal weakness or numbness.  10-point ROS otherwise negative.  ____________________________________________   PHYSICAL EXAM:  VITAL SIGNS:  ED Triage Vitals  Enc Vitals Group     BP 12/15/15 0951 126/93 mmHg     Pulse Rate 12/15/15 0951 85     Resp 12/15/15 0951 20     Temp --      Temp src --      SpO2 12/15/15 0951 99 %     Weight 12/15/15 0951 180 lb (81.647 kg)     Height 12/15/15 0951  (1.753 m)     Head Cir --      Peak Flow --      Pain Score --      Pain Loc --      Pain Edu? --      Excl. in GC? --     Constitutional: Alert and oriented. Depressed affect. Disheveled. Slow to respond. Eyes: Conjunctivae are normal. PERRL. EOMI. Head: Atraumatic. Nose: No congestion/rhinnorhea. Mouth/Throat: Mucous  membranes are moist.  Oropharynx non-erythematous. Neck: No stridor.   Cardiovascular: Normal rate, regular rhythm. Grossly normal heart sounds.  Good peripheral circulation. Respiratory: Normal respiratory effort.  No retractions. Lungs CTAB. Gastrointestinal: Soft and nontender. No distention. No abdominal bruits. No CVA tenderness. Musculoskeletal: No lower extremity tenderness nor edema.  No joint effusions. Neurologic:  Normal speech and language. No gross focal neurologic deficits are appreciated. No gait instability. Skin:  Skin is warm, dry and intact. No rash noted. Psychiatric: Depressed affect. Barely speaks to me when I ask him questions. Nods head yes and no.   ____________________________________________   LABS (all labs ordered are listed, but only abnormal results are displayed)  Labs Reviewed  COMPREHENSIVE METABOLIC PANEL - Abnormal; Notable for the following:    Glucose, Bld 111 (*)    BUN 24 (*)    Creatinine, Ser 1.56 (*)    Calcium 10.9 (*)    Total Bilirubin 2.1 (*)    GFR calc non Af Amer 46 (*)    GFR calc Af Amer 53 (*)    All other components within normal limits  ACETAMINOPHEN LEVEL - Abnormal; Notable for the following:    Acetaminophen (Tylenol), Serum <10 (*)    All other components within normal limits  CBC - Abnormal; Notable for the following:    RBC 6.09 (*)    Hemoglobin 19.0 (*)    HCT 53.3 (*)    All other components within normal limits  ETHANOL  SALICYLATE LEVEL  URINE DRUG SCREEN, QUALITATIVE ([facility] ONLY)   ____________________________________________  EKG   ____________________________________________  RADIOLOGY   ____________________________________________   PROCEDURES    ____________________________________________   INITIAL IMPRESSION / ASSESSMENT AND PLAN / ED COURSE  Pertinent labs & imaging results that were available during my care of the patient were reviewed by me and considered in my medical decision making  (see chart for details).  Patient with signs of severe depression. Psychiatry to see. ____________________________________________   FINAL CLINICAL IMPRESSION(S) / ED DIAGNOSES  Depression.    Myrna Blazer, MD 12/15/15 [phone]

## 2015-12-15 NOTE — Consult Note (Signed)
[facility] Face-to-Face Psychiatry Consult   Reason for Consult:  Consult for this 65 year old man brought voluntarily to the emergency room because of severe depression Referring Physician:  [place] Patient Identification: Dillon [name] MRN:  [id number] Principal Diagnosis: Severe recurrent major depression without psychotic features [facility]) Diagnosis:   Patient Active Problem List   Diagnosis Date Noted  . Severe depression [F32.9] 12/15/2015  . Severe recurrent major depression without psychotic features ([place]) [F33.2] 12/15/2015  . Grief [F43.21] 12/15/2015  . Prostate enlargement [N40.0] 12/15/2015  . Weight loss [R63.4] 12/15/2015  . OCD (obsessive compulsive disorder) [F42.9] 12/15/2015  . Depression [F32.9] 12/01/2015  . Ear pain [H92.09] 12/01/2015  . Low serum potassium level [E87.6] 12/01/2015  . Benign prostatic hyperplasia with urinary obstruction [N40.1] 11/24/2015  . Diastasis recti [M62.08] 11/24/2015  . Early satiety [R68.81] 11/24/2015  . Incomplete bladder emptying [R33.9] 11/24/2015  . Edema leg [R60.0] 11/13/2015  . Bladder retention [R33.9] 11/10/2015  . Bladder calculi [N21.0] 10/25/2015  . Frank hematuria [R31.0] 10/23/2015  . Calculus of kidney [N20.0] 10/23/2015  . Renal colic [N47] 09/62/8366  . 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 Calculi, ureter [N20.1] 10/23/2015  . Insomnia [G47.00] 09/07/2015    Total Time spent with patient: 1 hour  Subjective:   ABDULLOH [name] is a 65 y.o. male patient admitted with "I'm not feeling good at all".  HPI:  Patient interviewed. Chart reviewed. Referral note from nurse practitioner reviewed as well as current labs and older chart. 65 year old man reports that he is feeling bad pretty much all of the time. His mood feels sad and down. He has no energy no motivation and no interest in doing anything. He sits around his house apparently completely idle. His sleep patterns are chaotic with poor sleep at night  and fatigue during the day. He has not been eating well and has lost weight. Patient denies that he's having thoughts of killing himself but says that he has had thoughts at times about wanting to give up. He has been prescribed some medicine by his outpatient biter, a nurse practitioner, but has not been compliant with treatment and has not gone to see a psychiatrist as she recommended. Patient says that he is crying frequently. Feels lonely and hopeless. All of this dates to when his wife died about a year ago but it is getting progressively worse. His adult children check on him regularly and help him to get food but are getting more worried about him. The patient has given up driving completely because he is afraid of not being able to do it. He says that he has a history of OCD and he is constantly worried about everything. He does report having had at least one episode of visual hallucination seeing snakes inside the house. Patient is not drinking and not abusing any drugs.  Social history: Patient is a widower. Wife died about a year ago. He is retired and not working. Stays around the house doing almost nothing. Has a son and a daughter who check in on him. Daughter brought him here to the hospital.  Medical history: Patient has a history of Harrington Challenger stated enlargement takes Flomax. Otherwise denies any known ongoing medical problems. No history of coronary artery disease diabetes or high blood pressure.  Substance abuse history: Patient denies that he drinks alcohol or abuses any other drugs denies any past history of drug or alcohol abuse.  Past Psychiatric History: Patient has had no prior inpatient treatment. He says  he was diagnosed with obsessive-compulsive disorder by primary care doctors in the past and was treated with medicine but he can't remember what he used to take. Apparently he has recently been prescribed mirtazapine 15 mg at night by his primary care nurse practitioner and also some  Ativan but he says he is only partially compliant with it. He denies that he's ever try to kill himself denies any history of violence.  Risk to Self: Is patient at risk for suicide?: No, but patient needs Medical Clearance Risk to Others:   Prior Inpatient Therapy:   Prior Outpatient Therapy:    Past Medical History:  Past Medical History  Diagnosis Date  . Meningitis     spinal  . OCD (obsessive compulsive disorder)   . Kidney stones     Past Surgical History  Procedure Laterality Date  . Kidney stone surgery     Family History:  Family History  Problem Relation Age of Onset  . Cancer Mother     bladder  . Emphysema Father   . Emphysema Sister   . COPD Sister   . Cancer Brother     prostate   Family Psychiatric  History: Patient reports that his father had mental health problems with nerves as well but he couldn't give [place] any more details than that. He was not aware of a family history of suicide. Social History:  History  Alcohol Use No     History  Drug Use No    Social History   Social History  . Marital Status: Widowed    Spouse Name: N/A  . Number of Children: N/A  . Years of Education: N/A   Social History Main Topics  . Smoking status: Never Smoker   . Smokeless tobacco: Never Used  . Alcohol Use: No  . Drug Use: No  . Sexual Activity: Not Asked   Other Topics Concern  . None   Social History Narrative   Additional Social History:    Allergies:   Allergies  Allergen Reactions  . Codeine Other (See Comments)    Unsure of reaction    Labs:  Results for orders placed or performed during the hospital encounter of 12/15/15 (from the past 48 hour(s))  Comprehensive metabolic panel     Status: Abnormal   Collection Time: 12/15/15  9:52 AM  Result Value Ref Range   Sodium 141 135 - 145 mmol/L   Potassium 4.9 3.5 - 5.1 mmol/L   Chloride 104 101 - 111 mmol/L   CO2 24 22 - 32 mmol/L   Glucose, Bld 111 (H) 65 - 99 mg/dL   BUN 24 (H) 6 - 20  mg/dL   Creatinine, Ser 1.56 (H) 0.61 - 1.24 mg/dL   Calcium 10.9 (H) 8.9 - 10.3 mg/dL   Total Protein 7.6 6.5 - 8.1 g/dL   Albumin 5.0 3.5 - 5.0 g/dL   AST 23 15 - 41 U/L   ALT 33 17 - 63 U/L   Alkaline Phosphatase 109 38 - 126 U/L   Total Bilirubin 2.1 (H) 0.3 - 1.2 mg/dL   GFR calc non Af Amer 46 (L) >60 mL/min   GFR calc Af Amer 53 (L) >60 mL/min    Comment: (NOTE) The eGFR has been calculated using the CKD EPI equation. This calculation has not been validated in all clinical situations. eGFR's persistently <60 mL/min signify possible Chronic Kidney Disease.    Anion gap 13 5 - 15  Ethanol (ETOH)  Status: None   Collection Time: 12/15/15  9:52 AM  Result Value Ref Range   Alcohol, Ethyl (B) <5 <5 mg/dL    Comment:        LOWEST DETECTABLE LIMIT FOR SERUM ALCOHOL IS 5 mg/dL FOR MEDICAL PURPOSES ONLY   Salicylate level     Status: None   Collection Time: 12/15/15  9:52 AM  Result Value Ref Range   Salicylate Lvl <8.3 2.8 - 30.0 mg/dL  Acetaminophen level     Status: Abnormal   Collection Time: 12/15/15  9:52 AM  Result Value Ref Range   Acetaminophen (Tylenol), Serum <10 (L) 10 - 30 ug/mL    Comment:        THERAPEUTIC CONCENTRATIONS VARY SIGNIFICANTLY. A RANGE OF 10-30 ug/mL MAY BE AN EFFECTIVE CONCENTRATION FOR MANY PATIENTS. HOWEVER, SOME ARE BEST TREATED AT CONCENTRATIONS OUTSIDE THIS RANGE. ACETAMINOPHEN CONCENTRATIONS >150 ug/mL AT 4 HOURS AFTER INGESTION AND >50 ug/mL AT 12 HOURS AFTER INGESTION ARE OFTEN ASSOCIATED WITH TOXIC REACTIONS.   CBC     Status: Abnormal   Collection Time: 12/15/15  9:52 AM  Result Value Ref Range   WBC 10.2 3.8 - 10.6 K/uL   RBC 6.09 (H) 4.40 - 5.90 MIL/uL   Hemoglobin 19.0 (H) 13.0 - 18.0 g/dL    Comment: RESULT REPEATED AND VERIFIED   HCT 53.3 (H) 40.0 - 52.0 %   MCV 87.6 80.0 - 100.0 fL   MCH 31.2 26.0 - 34.0 pg   MCHC 35.7 32.0 - 36.0 g/dL   RDW 13.3 11.5 - 14.5 %   Platelets 195 150 - 440 K/uL  Urine Drug  Screen, Qualitative ([facility] only)     Status: None   Collection Time: 12/15/15  9:52 AM  Result Value Ref Range   Tricyclic, Ur Screen NONE DETECTED NONE DETECTED   Amphetamines, Ur Screen NONE DETECTED NONE DETECTED   MDMA (Ecstasy)Ur Screen NONE DETECTED NONE DETECTED   Cocaine Metabolite,Ur [place] NONE DETECTED NONE DETECTED   Opiate, Ur Screen NONE DETECTED NONE DETECTED   Phencyclidine (PCP) Ur S NONE DETECTED NONE DETECTED   Cannabinoid 50 Ng, Ur  NONE DETECTED NONE DETECTED   Barbiturates, Ur Screen NONE DETECTED NONE DETECTED   Benzodiazepine, Ur Scrn NONE DETECTED NONE DETECTED   Methadone Scn, Ur NONE DETECTED NONE DETECTED    Comment: (NOTE) 382  Tricyclics, urine               Cutoff 1000 ng/mL 200  Amphetamines, urine             Cutoff 1000 ng/mL 300  MDMA (Ecstasy), urine           Cutoff 500 ng/mL 400  Cocaine Metabolite, urine       Cutoff 300 ng/mL 500  Opiate, urine                   Cutoff 300 ng/mL 600  Phencyclidine (PCP), urine      Cutoff 25 ng/mL 700  Cannabinoid, urine              Cutoff 50 ng/mL 800  Barbiturates, urine             Cutoff 200 ng/mL 900  Benzodiazepine, urine           Cutoff 200 ng/mL 1000 Methadone, urine                Cutoff 300 ng/mL 1100 1200 The urine drug screen provides only a preliminary,  unconfirmed 1300 analytical test result and should not be used for non-medical 1400 purposes. Clinical consideration and professional judgment should 1500 be applied to any positive drug screen result due to possible 1600 interfering substances. A more specific alternate chemical method 1700 must be used in order to obtain a confirmed analytical result.  1800 Gas chromato graphy / mass spectrometry (GC/MS) is the preferred 1900 confirmatory method.     Current Facility-Administered Medications  Medication Dose Route Frequency Provider Last Rate Last Dose  . [START ON 12/16/2015] FLUoxetine (PROZAC) capsule 20 mg  20 mg Oral Daily John T Clapacs,  MD      . tamsulosin (FLOMAX) capsule 0.4 mg  0.4 mg Oral QPC supper Gonzella Lex, MD       Current Outpatient Prescriptions  Medication Sig Dispense Refill  . LORazepam (ATIVAN) 0.5 MG tablet Take 1 tablet (0.5 mg total) by mouth 2 (two) times daily as needed for anxiety. 30 tablet 1  . LORazepam (ATIVAN) 1 MG tablet Take 1 tablet (1 mg total) by mouth every 8 (eight) hours as needed for anxiety. 6 tablet 0  . mirtazapine (REMERON) 15 MG tablet Take 1 tablet (15 mg total) by mouth at bedtime. 30 tablet 1  . tamsulosin (FLOMAX) 0.4 MG CAPS capsule Take 0.4 mg by mouth 2 (two) times daily.      Musculoskeletal: Strength & Muscle Tone: within normal limits Gait & Station: normal Patient leans: N/A  Psychiatric Specialty Exam: Review of Systems  Constitutional: Positive for malaise/fatigue.  HENT: Negative.   Eyes: Negative.   Respiratory: Negative.   Cardiovascular: Negative.   Gastrointestinal: Negative.   Musculoskeletal: Negative.   Skin: Negative.   Neurological: Negative.   Psychiatric/Behavioral: Positive for depression, hallucinations and memory loss. Negative for suicidal ideas and substance abuse. The patient is nervous/anxious and has insomnia.     Blood pressure 126/93, pulse 85, resp. rate 20, height 5' 9"  (1.753 m), weight 81.647 kg (180 lb), SpO2 99 %.Body mass index is 26.57 kg/(m^2).  General Appearance: Casual  Eye Contact::  Minimal  Speech:  Slow and Slurred  Volume:  Decreased  Mood:  Depressed  Affect:  Depressed  Thought Process:  Linear  Orientation:  Full (Time, Place, and Person)  Thought Content:  Negative  Suicidal Thoughts:  Yes.  without intent/plan  Homicidal Thoughts:  No  Memory:  Immediate;   Good Recent;   Poor Remote;   Fair  Judgement:  Fair  Insight:  Fair  Psychomotor Activity:  Decreased  Concentration:  Fair  Recall:  [employer] of Knowledge:Fair  Language: Fair  Akathisia:  No  Handed:  Right  AIMS (if indicated):      Assets:  Communication Skills Desire for Improvement Financial Resources/Insurance Housing Physical Health Resilience Social Support  ADL's:  Intact  Cognition: WNL  Sleep:      Treatment Plan Summary: Daily contact with patient to assess and evaluate symptoms and progress in treatment, Medication management and Plan Patient with a severe major depression. He had been given instructions for treatment by his outpatient provider but has not been compliant. Although the patient is not threatening suicide he is clearly doing a bad job taking care of himself. Not eating and losing weight. Clearly overwhelmed. Very anxious. Very tearful. Not likely to improve with simple outpatient treatment. I think the wisest thing would be to admit him to the hospital. Patient does have significant risk factors for potential suicide if not improving with his illness.  I am going to go ahead and start him on fluoxetine both for depression and OCD. Continue precautions because of concern about safety. Continue Flomax for history of prostate enlargement. Patient is agreeable to the plan.  Disposition: Recommend psychiatric Inpatient admission when medically cleared. Supportive therapy provided about ongoing stressors.  Alethia Berthold, MD 12/15/2015 12:58 PM

## 2015-12-15 NOTE — ED Notes (Signed)
Pt brought over from main ed, for adm to bhu for depression pt is to be adm as a vol pt to beh med. He had very poor hygine and was assisted to take a shower he is very cooperative but very flat and depressed and tearful, he denies any si/hi and is in no acute distress does c/o of abd cramping because he ate and he says he has not eaten well lately.

## 2015-12-15 NOTE — ED Notes (Signed)
Waiting for room assignment 

## 2015-12-15 NOTE — ED Notes (Signed)
Pt ate poorly for supper

## 2015-12-15 NOTE — ED Notes (Signed)
Pt sent here by PCP for depression and difficulty sleeping. Pt's daughter reports pt's wife died last 03/09/23 and pt has been declining since then.

## 2015-12-15 NOTE — ED Notes (Signed)
Pt walked to ED BHU by tech, Pam.

## 2015-12-15 NOTE — ED Notes (Signed)
Pt. Alert and oriented, warm and dry, in no distress. Pt. Denies SI, HI, and AVH. Pt. Encouraged to let nursing staff know of any concerns or needs.,ttok a shower earlier

## 2015-12-15 NOTE — Progress Notes (Signed)
Remeron 15 mg administered to patient from [facility]. However, I had already discharged patient for readmition to [facility] and am unable to chart against MAR since it now appears to be "read only."

## 2015-12-15 NOTE — BH Assessment (Signed)
Tele Assessment Note   Dillon [name] is an 65 y.o. male. Pt presents voluntarily to [facility] ED. He is cooperative and oriented x 4. Pt says that his wife of 40 yrs died last 15-Feb-2023. He reports he has experienced depressive symptoms since that time. Pt endorses poor appetite (30 lbs lost), insomnia, tearfulness, hopelessness, fatigue and loss of interest in usual activities. Pt endorses severe anxiety. He says he was dx with OCD years ago. Pt reports he often engages in repetitive actions like driving across town to check if he locked his door and frequently checking stovetops. Pt denies SI and HI. No delusions noted. Pt says that he once experienced VH as he saw snakes inside his house. His affect is sad and anxious. He says, "I'm afraid all the time." He states, "It is like I don't have anybody. My children are around me, but it is like I'm by myself." Pt denies SI but states he thought about giving up at times. He reports his two children are supportive and check on him frequently. He reports he didn't finish high school. 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 He reports he is thinking of quitting driving as driving scares him. Pt denies hx of substance use or abuse. Pt reports he thinks his father may have had a mental illness but that father was never formally dx.   Diagnosis: OCD Major Depressive Disorder, Single Episode, Severe without Psychotic Features  Past Medical History:  Past Medical History  Diagnosis Date  . Meningitis     spinal  . OCD (obsessive compulsive disorder)   . Kidney stones     Past Surgical History  Procedure Laterality Date  . Kidney stone surgery      Family History:  Family History  Problem Relation Age of Onset  . Cancer Mother     bladder  . Emphysema Father   . Emphysema Sister   . COPD Sister   . Cancer Brother     prostate    Social History:  reports that he has never smoked. He has never used smokeless  tobacco. He reports that he does not drink alcohol or use illicit drugs.  Additional Social History:  Alcohol / Drug Use Pain Medications: pt denies abuse - see PTA meds list Prescriptions: pt denies abuse - see PTA meds list Over the Counter: pt denies abuse - see PTA meds list History of alcohol / drug use?: No history of alcohol / drug abuse Longest period of sobriety (when/how long): n/a  CIWA: CIWA-Ar BP: (!) 126/93 mmHg Pulse Rate: 85 COWS:    PATIENT STRENGTHS: (choose at least two) Ability for insight Average or above average intelligence Capable of independent living Communication skills Supportive family/friends  Allergies:  Allergies  Allergen Reactions  . Codeine Other (See Comments)    Unsure of reaction    Home Medications:  (Not in a hospital admission)  OB/GYN Status:  No LMP for male patient.  General Assessment Data Location of Assessment: [facility] ED TTS Assessment: In system Is this a Tele or Face-to-Face Assessment?: Tele Assessment Is this an Initial Assessment or a Re-assessment for this encounter?: Initial Assessment Marital status: Widowed Is patient pregnant?: No Pregnancy Status: No Living Arrangements: Alone Can pt return to current living arrangement?: Yes Admission Status: Voluntary Is patient capable of signing voluntary admission?: Yes Referral Source: Self/Family/Friend (BIB daughter) Insurance type: blue cross     Crisis Care Plan Living Arrangements:  Alone Name of Psychiatrist: none Name of Therapist: none  Education Status Is patient currently in school?: No Highest grade of school patient has completed: 11  Risk to self with the past 6 months Suicidal Ideation: No Has patient been a risk to self within the past 6 months prior to admission? : No Suicidal Intent: No Has patient had any suicidal intent within the past 6 months prior to admission? : No Is patient at risk for suicide?: No Suicidal Plan?: No Has patient had  any suicidal plan within the past 6 months prior to admission? : No Access to Means:  (n/a) What has been your use of drugs/alcohol within the last 12 months?: pt denies use Previous Attempts/Gestures: No How many times?: 0 Other Self Harm Risks: none Triggers for Past Attempts:  (n/a) Intentional Self Injurious Behavior: None Family Suicide History: No Recent stressful life event(s): Loss (Comment) (wife of 40 yrs died last 2023/03/13) Persecutory voices/beliefs?: No Depression: Yes Depression Symptoms: Insomnia, Tearfulness, Despondent, Fatigue, Loss of interest in usual pleasures (poor appeitite) Substance abuse history and/or treatment for substance abuse?: No Suicide prevention information given to non-admitted patients: Not applicable  Risk to Others within the past 6 months Homicidal Ideation: No Does patient have any lifetime risk of violence toward others beyond the six months prior to admission? : No Thoughts of Harm to Others: No Current Homicidal Intent: No Current Homicidal Plan: No Access to Homicidal Means: No Identified Victim: none History of harm to others?: No Assessment of Violence: None Noted Violent Behavior Description: pt denies hx violence Does patient have access to weapons?: Yes (Comment) (pt owns several knives) Criminal Charges Pending?: No Does patient have a court date: No Is patient on probation?: No  Psychosis Hallucinations: Visual (pt reports one VH of seeing snakes inside the house) Delusions: None noted  Mental Status Report Appearance/Hygiene: Unremarkable, In scrubs Eye Contact: Good Motor Activity: Freedom of movement Speech: Logical/coherent, Slow Level of Consciousness: Quiet/awake Mood: Anxious, Depressed, Sad, Anhedonia Affect: Appropriate to circumstance, Anxious, Sad, Depressed Anxiety Level: Severe Thought Processes: Relevant, Coherent Judgement: Unimpaired Orientation: Person, Place, Time, Situation Obsessive Compulsive  Thoughts/Behaviors: None  Cognitive Functioning Concentration: Normal Memory: Remote Intact, Recent Intact IQ: Average Insight: Fair Impulse Control: Fair Appetite: Poor Weight Loss: 30 Sleep: Decreased Total Hours of Sleep: 5 Vegetative Symptoms: None  ADLScreening [facility] Assessment Services) Patient's cognitive ability adequate to safely complete daily activities?: Yes Patient able to express need for assistance with ADLs?: Yes Independently performs ADLs?: Yes (appropriate for developmental age)  Prior Inpatient Therapy Prior Inpatient Therapy: No Prior Therapy Dates: na Prior Therapy Facilty/Provider(s): na Reason for Treatment: na  Prior Outpatient Therapy Prior Outpatient Therapy: Yes Does patient have an ACCT team?: No Does patient have [facility] services? : No Does patient have P4[facility] services?: No  ADL Screening (condition at time of admission) Patient's cognitive ability adequate to safely complete daily activities?: Yes Is the patient deaf or have difficulty hearing?: No Does the patient have difficulty seeing, even when wearing glasses/contacts?: No Does the patient have difficulty concentrating, remembering, or making decisions?: Yes Patient able to express need for assistance with ADLs?: Yes Does the patient have difficulty dressing or bathing?: No Independently performs ADLs?: Yes (appropriate for developmental age) Does the patient have difficulty walking or climbing stairs?: No Weakness of Legs: None Weakness of Arms/Hands: None  Home Assistive Devices/Equipment Home Assistive Devices/Equipment: None    Abuse/Neglect Assessment (Assessment to be complete while patient is alone) Physical Abuse: Denies Verbal  Abuse: Denies Sexual Abuse: Denies Exploitation of patient/patient's resources: Denies Self-Neglect: Denies     Merchant navy officer (For Healthcare) Does patient have an advance directive?: No Would patient like information on creating an advanced  directive?: No - patient declined information    Additional Information 1:1 In Past 12 Months?: No CIRT Risk: No Elopement Risk: No Does patient have medical clearance?: Yes     Disposition:  Disposition Initial Assessment Completed for this Encounter: Yes Disposition of Patient: Inpatient treatment program Type of inpatient treatment program: Adult (dr clapacs recommends inpatient treatment when pt med [facility])  Calyb Mcquarrie P 12/15/2015 1:51 PM

## 2015-12-15 NOTE — Assessment & Plan Note (Signed)
Patient not actively suicidal according to his daughter, but significant weight loss and listless appearance is a threat to himself.  He agrees to go with his daughter to the ER for his significant depression.

## 2015-12-15 NOTE — ED Notes (Addendum)
Pt tearful in room; reports room is spinning. MD notified, verbal orders for  PO meclizine given. Pt only ate 25% of lunch tray.

## 2015-12-15 NOTE — Progress Notes (Signed)
BP 114/84 mmHg  Pulse 94  Temp(Src) 98.5 F (36.9 C)  Ht 5' 7.7" (1.72 m)  Wt 168 lb 6.4 oz (76.386 kg)  BMI 25.82 kg/m2  SpO2 98%   Subjective:    Patient ID: Dillon [name], male    DOB: 06-15-1951, 65 y.o.   MRN: [id number]  HPI: Dillon [name] is a 65 y.o. male  Chief Complaint  Patient presents with  . Depression   Pt is here to follow up of his depression.  He is not doing well and haven't gone to [place] or called him.  He is here with his daughter who doesn't feel like he is suicidal or a danger to himself.  He has lost a lot of weight and is continuing to lose weight down another 14 pounds from 2 weeks ago.  He did take the Remeron perscirbed which helped him sleep.    Pt wrote a ? About thoughts of hurting self.  Daughter states he is "absolutely not" suicidal.    Depression screen [facility] 2/9 12/15/2015 12/01/2015 09/07/2015  Decreased Interest Down, Depressed, Hopeless PHQ - 2 Score Altered sleeping - 3 3  Tired, decreased energy Change in appetite 3 3 0  Feeling bad or failure about yourself  Trouble concentrating - 2 0  Moving slowly or fidgety/restless 3 3 0  Suicidal thoughts (No Data) 0 0  PHQ-9 Score - 23 13                                                                                                                                                                                                                    Relevant past medical, surgical, family and social history reviewed and updated as indicated. Interim medical history since our last visit reviewed. Allergies and medications reviewed and updated.  Review of Systems  Per HPI unless specifically indicated above     Objective:    BP 114/84 mmHg  Pulse 94  Temp(Src) 98.5 F (36.9 C)  Ht 5' 7.7" (1.72 m)  Wt 168 lb 6.4 oz (76.386 kg)  BMI 25.82 kg/m2  SpO2 98%  Wt Readings from Last 3 Encounters:  12/15/15 168 lb 6.4 oz (76.386 kg)  12/01/15 182 lb 3.2  oz (82.645 kg)  09/07/15 206 lb 12.8 oz (93.804 kg)    Physical Exam  Constitutional: He is oriented to person, place, and  time. He appears lethargic. He is cooperative. No distress.  HENT:  Head: Normocephalic and atraumatic.  Eyes: Conjunctivae and lids are normal. Right eye exhibits no discharge. Left eye exhibits no discharge. No scleral icterus.  Cardiovascular: Normal rate.   Pulmonary/Chest: Effort normal.  Abdominal: Normal appearance. There is no splenomegaly or hepatomegaly.  Musculoskeletal: Normal range of motion.  Neurological: He is oriented to person, place, and time. He appears lethargic.  Skin: Skin is intact. No rash noted. No pallor.  Psychiatric: He has a normal mood and affect. His behavior is normal. Judgment and thought content normal.    Results for orders placed or performed during the hospital encounter of 11/29/15  Lipase, blood  Result Value Ref Range   Lipase 23 11 - 51 U/L  Comprehensive metabolic panel  Result Value Ref Range   Sodium 141 135 - 145 mmol/L   Potassium 2.9 (LL) 3.5 - 5.1 mmol/L   Chloride 107 101 - 111 mmol/L   CO2 24 22 - 32 mmol/L   Glucose, Bld 129 (H) 65 - 99 mg/dL   BUN 12 6 - 20 mg/dL   Creatinine, Ser 1.61 0.61 - 1.24 mg/dL   Calcium 9.9 8.9 - 09.6 mg/dL   Total Protein 6.9 6.5 - 8.1 g/dL   Albumin 4.5 3.5 - 5.0 g/dL   AST 26 15 - 41 U/L   ALT 29 17 - 63 U/L   Alkaline Phosphatase 83 38 - 126 U/L   Total Bilirubin 1.9 (H) 0.3 - 1.2 mg/dL   GFR calc non Af Amer >60 >60 mL/min   GFR calc Af Amer >60 >60 mL/min   Anion gap 10 5 - 15  CBC  Result Value Ref Range   WBC 9.8 3.8 - 10.6 K/uL   RBC 5.58 4.40 - 5.90 MIL/uL   Hemoglobin 17.3 13.0 - 18.0 g/dL   HCT 04.5 40.9 - 81.1 %   MCV 87.9 80.0 - 100.0 fL   MCH 31.1 26.0 - 34.0 pg   MCHC 35.4 32.0 - 36.0 g/dL   RDW 91.4 78.2 - 95.6 %   Platelets 174 150 - 440 K/uL  Urinalysis complete, with microscopic ([facility] only)  Result Value Ref Range   Color, Urine YELLOW (A)  YELLOW   APPearance CLEAR (A) CLEAR   Glucose, UA NEGATIVE NEGATIVE mg/dL   Bilirubin Urine NEGATIVE NEGATIVE   Ketones, ur TRACE (A) NEGATIVE mg/dL   Specific Gravity, Urine 1.005 1.005 - 1.030   Hgb urine dipstick 3+ (A) NEGATIVE   pH 7.0 5.0 - 8.0   Protein, ur NEGATIVE NEGATIVE mg/dL   Nitrite NEGATIVE NEGATIVE   Leukocytes, UA NEGATIVE NEGATIVE   RBC / HPF 6-30 0 - 5 RBC/hpf   WBC, UA 0-5 0 - 5 WBC/hpf   Bacteria, UA NONE SEEN NONE SEEN   Squamous Epithelial / LPF NONE SEEN NONE SEEN   Mucous PRESENT   Glucose, capillary  Result Value Ref Range   Glucose-Capillary 110 (H) 65 - 99 mg/dL      Assessment & Plan:   Problem List Items Addressed This Visit      Unprioritized   Severe depression - Primary    Patient not actively suicidal according to his daughter, but significant weight loss and listless appearance is a threat to himself.  He agrees to go with his daughter to the ER for his significant depression.            Follow up plan: Return for In the  ER.  Report called to triage.

## 2015-12-16 DIAGNOSIS — F332 Major depressive disorder, recurrent severe without psychotic features: Principal | ICD-10-CM

## 2015-12-16 MED ORDER — MIRTAZAPINE 15 MG PO TABS
15.0000 mg | ORAL_TABLET | Freq: Every day | ORAL | Status: DC
  Administered 2015-12-16 – 2015-12-20 (×5): 15 mg via ORAL
  Filled 2015-12-16 (×6): qty 1

## 2015-12-16 MED ORDER — MEGESTROL ACETATE 400 MG/10ML PO SUSP
400.0000 mg | Freq: Every day | ORAL | Status: DC
  Administered 2015-12-16 – 2015-12-21 (×6): 400 mg via ORAL
  Filled 2015-12-16 (×9): qty 10

## 2015-12-16 MED ORDER — TRAZODONE HCL 100 MG PO TABS
100.0000 mg | ORAL_TABLET | Freq: Every day | ORAL | Status: DC
  Administered 2015-12-16 – 2015-12-18 (×3): 100 mg via ORAL
  Filled 2015-12-16 (×3): qty 1

## 2015-12-16 MED ORDER — FLUVOXAMINE MALEATE 50 MG PO TABS
50.0000 mg | ORAL_TABLET | Freq: Every day | ORAL | Status: DC
  Administered 2015-12-16: 50 mg via ORAL
  Filled 2015-12-16: qty 1

## 2015-12-16 MED ORDER — MEGESTROL ACETATE 40 MG PO TABS
40.0000 mg | ORAL_TABLET | Freq: Every day | ORAL | Status: DC
  Administered 2015-12-16: 40 mg via ORAL

## 2015-12-16 MED ORDER — ENSURE ENLIVE PO LIQD
237.0000 mL | Freq: Three times a day (TID) | ORAL | Status: DC
  Administered 2015-12-16 – 2015-12-21 (×13): 237 mL via ORAL

## 2015-12-16 NOTE — [facility] Suicide Risk Assessment (Signed)
[facility] Admission Suicide Risk Assessment   Nursing information obtained from:  Patient Demographic factors:  Male, Divorced or widowed, Living alone Current Mental Status:  NA (Denies) Loss Factors:  Loss of significant relationship, Financial problems / change in socioeconomic status Historical Factors:  NA Risk Reduction Factors:  Religious beliefs about death, Positive social support  Total Time spent with patient: 1 hour Principal Problem: Severe recurrent major depression without psychotic features (HCC) Diagnosis:   Patient Active Problem List   Diagnosis Date Noted  . Severe recurrent major depression without psychotic features (HCC) [F33.2] 12/15/2015  . Grief [F43.21] 12/15/2015  . Weight loss [R63.4] 12/15/2015  . OCD (obsessive compulsive disorder) [F42.9] 12/15/2015  . Benign prostatic hyperplasia with urinary obstruction [N40.1] 11/24/2015  . 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  Continued Clinical Symptoms:  Alcohol Use Disorder Identification Test Final Score (AUDIT): 0 The "Alcohol Use Disorders Identification Test", Guidelines for Use in Primary Care, Second Edition.  World Science writer [facility]). Score between 0-7:  no or low risk or alcohol related problems. Score between 8-15:  moderate risk of alcohol related problems. Score between 16-19:  high risk of alcohol related problems. Score 20 or above:  warrants further diagnostic evaluation for alcohol dependence and treatment.   CLINICAL FACTORS:   Severe Anxiety and/or Agitation Depression:   Hopelessness Insomnia Severe   Musculoskeletal: Strength & Muscle Tone: within normal limits Gait & Station: normal Patient leans: N/A  Psychiatric Specialty Exam: Review of Systems  Constitutional: Positive for weight loss.  Psychiatric/Behavioral: Positive for depression and suicidal ideas. The patient is nervous/anxious and has insomnia.   All  other systems reviewed and are negative.   Blood pressure 117/82, pulse 88, temperature 97.9 F (36.6 C), temperature source Oral, resp. rate 18, height  (1.753 m), weight 74.844 kg (165 lb), SpO2 99 %.Body mass index is 24.36 kg/(m^2).  General Appearance: Casual  Eye Contact::  Fair  Speech:  Clear and Coherent  Volume:  Decreased  Mood:  Depressed, Hopeless and Worthless  Affect:  Appropriate  Thought Process:  Goal Directed  Orientation:  Full (Time, Place, and Person)  Thought Content:  WDL  Suicidal Thoughts:  Yes.  with intent/plan  Homicidal Thoughts:  No  Memory:  Immediate;   Fair Recent;   Fair Remote;   Fair  Judgement:  Fair  Insight:  Shallow  Psychomotor Activity:  Decreased  Concentration:  Fair  Recall:  [employer] of Knowledge:Fair  Language: Fair  Akathisia:  No  Handed:  Right  AIMS (if indicated):     Assets:  Communication Skills Desire for Improvement Financial Resources/Insurance Housing Physical Health Resilience Social Support  Sleep:  Number of Hours: 4.25  Cognition: WNL  ADL's:  Intact    COGNITIVE FEATURES THAT CONTRIBUTE TO RISK:  None    SUICIDE RISK:   Moderate:  Frequent suicidal ideation with limited intensity, and duration, some specificity in terms of plans, no associated intent, good self-control, limited dysphoria/symptomatology, some risk factors present, and identifiable protective factors, including available and accessible social support.  PLAN OF CARE: Hospital admission, medication management, discharge planning.  Mr. Sposito is a 65 year old male with a history of anxiety and depression admitted for worsening of his symptoms, severe weight loss, and insomnia in the context of recent major loss.  1. Suicidal ideation. The patient is able to contract for safety in the hospital.   2. Mood and anxiety. We'll start Luvox  for depression and OCD type of symptoms. We will discontinue Ativan.  3. Insomnia. We'll offer  trazodone.  4. BPH. Will continue Flomax.  5. Disposition. The patient will be discharged home. He needs a follow-up appointment with a psychiatrist and therapist. He will go to [facility].  I certify that inpatient services furnished can reasonably be expected to improve the patient's condition.   Kristine Linea, MD 12/16/2015, 11:16 AM

## 2015-12-16 NOTE — [facility] Group Notes (Signed)
[facility] LCSW Group Therapy  12/16/2015 3:07 PM  Type of Therapy:  Group Therapy  Participation Level:  Active  Participation Quality:  Appropriate and Attentive  Affect:  Depressed and Tearful  Cognitive:  Alert, Appropriate and Oriented  Insight:  Improving  Engagement in Therapy:  Engaged  Modes of Intervention:  Discussion, Socialization and Support  Summary of Progress/Problems: Patient attended and participated in group appropriately introducing himself and sharing his 'super power' is "being a strong mind and level headed but lost my wife and found that I'm not strong". Patient has a very negative idea of himself but was able to receive feedback from other group members how he has encouraged them in their hardship but patient reports he feels that he has no purpose anymore even with grandchildren.   Lulu Riding, MSW, LCSWA 12/16/2015, 3:07 PM

## 2015-12-16 NOTE — [facility] Group Notes (Signed)
[facility] Group Notes:  (Nursing/MHT/Case Management/Adjunct)  Date:  12/16/2015  Time:  1:40 PM  Type of Therapy:  Group Therapy  Participation Level:  Did Not Attend  Participation Quality:   Summary of Progress/Problems:  Dillon [name] 12/16/2015, 1:40 PM

## 2015-12-16 NOTE — H&P (Signed)
Psychiatric Admission Assessment Adult  Patient Identification: Dillon [name] MRN:  [id number] Date of Evaluation:  12/16/2015 Chief Complaint:  depression Principal Diagnosis: Severe recurrent major depression without psychotic features [facility]) Diagnosis:   Patient Active Problem List   Diagnosis Date Noted  . Severe recurrent major depression without psychotic features ([place]) [F33.2] 12/15/2015  . Grief [F43.21] 12/15/2015  . Weight loss [R63.4] 12/15/2015  . OCD (obsessive compulsive disorder) [F42.9] 12/15/2015  . Benign prostatic hyperplasia with urinary obstruction [N40.1] 11/24/2015  . 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 Dillon [name] is a 65 year old male with a history of depression and anxiety.  Chief complaint. "I don't think I can handle it any longer."  History of present illness. Information was obtained from the patient and the chart. The patient has a long history of anxiety with OCD type of symptoms. He receives treatment for his symptoms periodically from his primary care provider. He became severely depressed and anxious after his wife passed away in 02/25/2015. He lost 35 pounds since. He reports poor sleep, decreased appetite, anhedonia, hopelessness worthlessness, poor energy and lack of interest, social isolation crying spells, worsening of his anxiety including OCD symptoms and suicidal ideation. The patient has been trying to deal with it the best he could but has been struggling recently. He go grief counseling and it was somewhat helpful. He has been getting Remeron and Ativan from his primary care provider with some relief. He did not see psychiatrist in spite of PCP recommendation. His OCD symptoms include excessive worries checking and cleaning. He sometimes has thoughts of not wanting to press on. He denies psychotic symptoms. He denies symptoms suggestive of bipolar disorder. He does not use drugs or  alcohol.  Past psychiatric history. He has never been hospitalized. There were no suicide attempts. He has diagnoses of OCD. He does not remember the names of medications that have been tried.  Family psychiatric history. Father with mental illness mostly anxiety and depression.  Social history. He retired in March of last year. His wife passed away in 02/25/2023. He lives by himself. He has 2 adult children and grandchildren nearby but the patient has very little interest in interacting with his family even though he loves them dearly. He experiences some financial difficulties. He is poorly mostly because he does not cook to save the money on [employer]chand[employer], retail. He has to my private insurance. It causes financial strain.  Total Time spent with patient: 1 hour  Past Psychiatric History: Depression and anxiety.  Is the patient at risk to self? Yes.    Has the patient been a risk to self in the past 6 months? No.  Has the patient been a risk to self within the distant past? No.  Is the patient a risk to others? No.  Has the patient been a risk to others in the past 6 months? No.  Has the patient been a risk to others within the distant past? No.   Prior Inpatient Therapy:   Prior Outpatient Therapy:    Alcohol Screening: 1. How often do you have a drink containing alcohol?: Never 9. Have you or someone else been injured as a result of your drinking?: No 10. Has a relative or friend or a doctor or another health worker been concerned about your drinking or suggested you cut down?: No Alcohol Use Disorder Identification Test Final Score (AUDIT): 0 Brief Intervention: AUDIT score less than 7 or less-screening  does not suggest unhealthy drinking-brief intervention not indicated Substance Abuse History in the last 12 months:  No. Consequences of Substance Abuse: NA Previous Psychotropic Medications: Yes. Psychological Evaluations: No  Past Medical History:  Past Medical History  Diagnosis Date   . Meningitis     spinal  . OCD (obsessive compulsive disorder)   . Kidney stones     Past Surgical History  Procedure Laterality Date  . Kidney stone surgery     Family History:  Family History  Problem Relation Age of Onset  . Cancer Mother     bladder  . Emphysema Father   . Emphysema Sister   . COPD Sister   . Cancer Brother     prostate   Family Psychiatric  History: Father with mental illness. Tobacco Screening: @FLOW ([phone])::1)@ Social History:  History  Alcohol Use No     History  Drug Use No    Additional Social History:                           Allergies:   Allergies  Allergen Reactions  . Codeine Other (See Comments)    Unsure of reaction   Lab Results:  Results for orders placed or performed during the hospital encounter of 12/15/15 (from the past 48 hour(s))  Comprehensive metabolic panel     Status: Abnormal   Collection Time: 12/15/15  9:52 AM  Result Value Ref Range   Sodium 141 135 - 145 mmol/L   Potassium 4.9 3.5 - 5.1 mmol/L   Chloride 104 101 - 111 mmol/L   CO2 24 22 - 32 mmol/L   Glucose, Bld 111 (H) 65 - 99 mg/dL   BUN 24 (H) 6 - 20 mg/dL   Creatinine, Ser 1.56 (H) 0.61 - 1.24 mg/dL   Calcium 10.9 (H) 8.9 - 10.3 mg/dL   Total Protein 7.6 6.5 - 8.1 g/dL   Albumin 5.0 3.5 - 5.0 g/dL   AST 23 15 - 41 U/L   ALT 33 17 - 63 U/L   Alkaline Phosphatase 109 38 - 126 U/L   Total Bilirubin 2.1 (H) 0.3 - 1.2 mg/dL   GFR calc non Af Amer 46 (L) >60 mL/min   GFR calc Af Amer 53 (L) >60 mL/min    Comment: (NOTE) The eGFR has been calculated using the CKD EPI equation. This calculation has not been validated in all clinical situations. eGFR's persistently <60 mL/min signify possible Chronic Kidney Disease.    Anion gap 13 5 - 15  Ethanol (ETOH)     Status: None   Collection Time: 12/15/15  9:52 AM  Result Value Ref Range   Alcohol, Ethyl (B) <5 <5 mg/dL    Comment:        LOWEST DETECTABLE LIMIT FOR SERUM ALCOHOL IS 5  mg/dL FOR MEDICAL PURPOSES ONLY   Salicylate level     Status: None   Collection Time: 12/15/15  9:52 AM  Result Value Ref Range   Salicylate Lvl <6.3 2.8 - 30.0 mg/dL  Acetaminophen level     Status: Abnormal   Collection Time: 12/15/15  9:52 AM  Result Value Ref Range   Acetaminophen (Tylenol), Serum <10 (L) 10 - 30 ug/mL    Comment:        THERAPEUTIC CONCENTRATIONS VARY SIGNIFICANTLY. A RANGE OF 10-30 ug/mL MAY BE AN EFFECTIVE CONCENTRATION FOR MANY PATIENTS. HOWEVER, SOME ARE BEST TREATED AT CONCENTRATIONS OUTSIDE THIS RANGE. ACETAMINOPHEN CONCENTRATIONS >150 ug/mL AT  4 HOURS AFTER INGESTION AND >50 ug/mL AT 12 HOURS AFTER INGESTION ARE OFTEN ASSOCIATED WITH TOXIC REACTIONS.   CBC     Status: Abnormal   Collection Time: 12/15/15  9:52 AM  Result Value Ref Range   WBC 10.2 3.8 - 10.6 K/uL   RBC 6.09 (H) 4.40 - 5.90 MIL/uL   Hemoglobin 19.0 (H) 13.0 - 18.0 g/dL    Comment: RESULT REPEATED AND VERIFIED   HCT 53.3 (H) 40.0 - 52.0 %   MCV 87.6 80.0 - 100.0 fL   MCH 31.2 26.0 - 34.0 pg   MCHC 35.7 32.0 - 36.0 g/dL   RDW 13.3 11.5 - 14.5 %   Platelets 195 150 - 440 K/uL  Urine Drug Screen, Qualitative ([facility] only)     Status: None   Collection Time: 12/15/15  9:52 AM  Result Value Ref Range   Tricyclic, Ur Screen NONE DETECTED NONE DETECTED   Amphetamines, Ur Screen NONE DETECTED NONE DETECTED   MDMA (Ecstasy)Ur Screen NONE DETECTED NONE DETECTED   Cocaine Metabolite,Ur Colo NONE DETECTED NONE DETECTED   Opiate, Ur Screen NONE DETECTED NONE DETECTED   Phencyclidine (PCP) Ur S NONE DETECTED NONE DETECTED   Cannabinoid 50 Ng, Ur Thayer NONE DETECTED NONE DETECTED   Barbiturates, Ur Screen NONE DETECTED NONE DETECTED   Benzodiazepine, Ur Scrn NONE DETECTED NONE DETECTED   Methadone Scn, Ur NONE DETECTED NONE DETECTED    Comment: (NOTE) 366  Tricyclics, urine               Cutoff 1000 ng/mL 200  Amphetamines, urine             Cutoff 1000 ng/mL 300  MDMA (Ecstasy), urine            Cutoff 500 ng/mL 400  Cocaine Metabolite, urine       Cutoff 300 ng/mL 500  Opiate, urine                   Cutoff 300 ng/mL 600  Phencyclidine (PCP), urine      Cutoff 25 ng/mL 700  Cannabinoid, urine              Cutoff 50 ng/mL 800  Barbiturates, urine             Cutoff 200 ng/mL 900  Benzodiazepine, urine           Cutoff 200 ng/mL 1000 Methadone, urine                Cutoff 300 ng/mL 1100 1200 The urine drug screen provides only a preliminary, unconfirmed 1300 analytical test result and should not be used for non-medical 1400 purposes. Clinical consideration and professional judgment should 1500 be applied to any positive drug screen result due to possible 1600 interfering substances. A more specific alternate chemical method 1700 must be used in order to obtain a confirmed analytical result.  1800 Gas chromato graphy / mass spectrometry (GC/MS) is the preferred 1900 confirmatory method.     Blood Alcohol level:  Lab Results  Component Value Date   ETH <5 44/12/4740    Metabolic Disorder Labs:  No results found for: HGBA1C, MPG No results found for: PROLACTIN No results found for: CHOL, TRIG, HDL, CHOLHDL, VLDL, LDLCALC  Current Medications: Current Facility-Administered Medications  Medication Dose Route Frequency Provider Last Rate Last Dose  . acetaminophen (TYLENOL) tablet 650 mg  650 mg Oral Q6H PRN Gonzella Lex, MD      . alum &  mag hydroxide-simeth (MAALOX/MYLANTA) 200-200-20 MG/5ML suspension 30 mL  30 mL Oral Q4H PRN Gonzella Lex, MD      . fluvoxaMINE (LUVOX) tablet 50 mg  50 mg Oral QHS Chukwudi Ewen B Karmine Kauer, MD      . magnesium hydroxide (MILK OF MAGNESIA) suspension 30 mL  30 mL Oral Daily PRN Gonzella Lex, MD      . tamsulosin (FLOMAX) capsule 0.4 mg  0.4 mg Oral QPC supper Gonzella Lex, MD      . traZODone (DESYREL) tablet 100 mg  100 mg Oral QHS Cecily Lawhorne B Tristyn Pharris, MD       PTA Medications: Prescriptions prior to admission  Medication  Sig Dispense Refill Last Dose  . LORazepam (ATIVAN) 0.5 MG tablet Take 1 tablet (0.5 mg total) by mouth 2 (two) times daily as needed for anxiety. 30 tablet 1 PRN  . LORazepam (ATIVAN) 1 MG tablet Take 1 tablet (1 mg total) by mouth every 8 (eight) hours as needed for anxiety. 6 tablet 0 PRN  . mirtazapine (REMERON) 15 MG tablet Take 1 tablet (15 mg total) by mouth at bedtime. 30 tablet 1 Unknown  . tamsulosin (FLOMAX) 0.4 MG CAPS capsule Take 0.4 mg by mouth 2 (two) times daily.   Unknown    Musculoskeletal: Strength & Muscle Tone: within normal limits Gait & Station: normal Patient leans: N/A  Psychiatric Specialty Exam: Physical exam performed in the emergency room and agree with the findings. Physical Exam  Nursing note and vitals reviewed.   Review of Systems  Constitutional: Positive for weight loss.  Psychiatric/Behavioral: Positive for depression. The patient is nervous/anxious and has insomnia.     Blood pressure 117/82, pulse 88, temperature 97.9 F (36.6 C), temperature source Oral, resp. rate 18, height 5' 9"  (1.753 m), weight 74.844 kg (165 lb), SpO2 99 %.Body mass index is 24.36 kg/(m^2).  See SRA.                                                  Sleep:  Number of Hours: 4.25     Treatment Plan Summary: Daily contact with patient to assess and evaluate symptoms and progress in treatment and Medication management   Mr. Hipple is a 65 year old male with a history of anxiety and depression admitted for worsening of his symptoms, severe weight loss, and insomnia in the context of recent major loss.  1. Suicidal ideation. The patient is able to contract for safety in the hospital.   2. Mood and anxiety. We'll start Luvox for depression and OCD type of symptoms. We will discontinue Ativan.  3. Insomnia. We'll offer trazodone.  4. BPH. Will continue Flomax.  5. Disposition. The patient will be discharged home. He needs a follow-up appointment with  a psychiatrist and therapist. He will go to [facility]. Oh okay all K is Observation Level/Precautions:  15 minute checks  Laboratory:  CBC Chemistry Profile UDS UA  Psychotherapy:    Medications:    Consultations:    Discharge Concerns:    Estimated LOS:  Other:     I certify that inpatient services furnished can reasonably be expected to improve the patient's condition.    Orson Slick, MD 3/1/201711:22 AM
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
## 2015-12-16 NOTE — Plan of Care (Signed)
Problem: Alteration in mood; excessive anxiety as evidenced by: Goal: STG-Pt will report an absence of self-harm thoughts/actions (Patient will report an absence of self-harm thoughts or actions)  Outcome: Progressing Pt has reported and absence of self-harm thoughts and actions

## 2015-12-16 NOTE — Progress Notes (Signed)
Recreation Therapy Notes  INPATIENT RECREATION THERAPY ASSESSMENT  Patient Details Name: Dillon [name] MRN: [id number] DOB: [birth year] Today's Date: 12/30/15  Patient Stressors: Death, Other (Comment) (Wife died a year ago; anxiety, OCD, everything he thinks about worries him)  Coping Skills:   Isolate, Avoidance  Personal Challenges: Anger, Concentration, Decision-Making, Problem-Solving, Self-Esteem/Confidence, Social Interaction, Stress Management, Trusting Others  Leisure Interests (2+):   (Nothing anymore)  Awareness of Community Resources:  No  Community Resources:     Current Use:    If no, Barriers?:    Patient Strengths:  "Not really anymore"  Patient Identified Areas of Improvement:  Wishes he could snap his fingers and make it all better  Current Recreation Participation:  Nothing  Patient Goal for Hospitalization:  To get out  [place] of Residence:  [place] of Residence:  [place]   Current SI (including self-harm):  No  Current HI:  No  Consent to Intern Participation: N/A   Jacquelynn Cree, LRT/CTRS 2015-12-30, 1:53 PM

## 2015-12-16 NOTE — Tx Team (Signed)
Initial Interdisciplinary Treatment Plan   PATIENT STRESSORS: Financial difficulties Loss of Wife in April   PATIENT STRENGTHS: Ability for insight Average or above average intelligence Capable of independent living General fund of knowledge Supportive family/friends   PROBLEM LIST: Problem List/Patient Goals Date to be addressed Date deferred Reason deferred Estimated date of resolution  Depression 12/16/2015     Anxiety 12/16/2015     "Improve mood" 12/16/2015     "have more energy" 12/16/2015                                    DISCHARGE CRITERIA:  Improved stabilization in mood, thinking, and/or behavior Motivation to continue treatment in a less acute level of care  PRELIMINARY DISCHARGE PLAN: Attend aftercare/continuing care group Outpatient therapy  PATIENT/FAMIILY INVOLVEMENT: This treatment plan has been presented to and reviewed with the patient, Dillon [name].  The patient and family have been given the opportunity to ask questions and make suggestions.  Franky Macho B Anirudh Baiz 12/16/2015, 1:52 AM

## 2015-12-16 NOTE — Progress Notes (Signed)
D: Pt received from [facility]. Pt has no skins issues observed and no contraband was found on pt. Patient alert and oriented x4. Patient denies SI/HI/AVH. Pt affect is anxious, sad, depressed, and blunted. Pt rated anxiety 0/10 and depression 0/10 "right now", but said that it fluctuates quite heavily. Pt indicated that he went to see his doctor today, and she advised he come to the hospital due to excessive weight loss and not having followed-up with a psychiatrist. Pt endorsed a weight loss of approximately 30lb since December to loss of appetite. Pt unable to determine if loss of appetite is primarily due to previous kidney stones or mental health. Pt endorses feeling worthless, lonely, anxious and depressed much of the day as a consequence of his wife's death in 03-01-15. Pt endorses spending much of the day in bed, having a poor appetite, having no interest in activities, not sleeping well, and having very low energy. 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   A: Performed skin and contraband check with Nadeen Landau. Reviewed admission material with pt. Orient pt to unit. Educated pt on unit policies. Offered active listening and support. Provided therapeutic communication. Offered sandwich tray R: Pt pleasant and cooperative.No medication to give this shift. Pt ate sandwich tray/ Will continue Q15 min. checks. Safety maintained.

## 2015-12-16 NOTE — BHH Group Notes (Signed)
[facility] LCSW Aftercare Discharge Planning Group Note   12/16/2015 3:13 PM  Participation Quality:  Patient attended group and introduced himself sharing his SMART goal is to "feel more better, I'm feeling some better". Patient reports he lost his wife and is having a difficult time grieving his loss.   Mood/Affect:  Depressed  Depression Rating:  2  Anxiety Rating:  3  Thoughts of Suicide:  Negative Will you contract for safety?   Yes  Current AVH:  No  Plan for Discharge/Comments:  Patient has a provider and can discharge home  Transportation Means: patient has transportation available at discharge  Supports: family and a provider  Ernestine Mcmurray, Benita Stabile, MSW, LCSWA

## 2015-12-16 NOTE — Progress Notes (Signed)
Recreation Therapy Notes  Date: 03.01.17 Time: 3:00 pm Location: Community Room  Group Topic: Self-esteem  Goal Area(s) Addresses:  Patients will write at least one positive trait about themselves. Patients will verbalize benefit of having a healthy self-esteem.  Behavioral Response: Did not attend  Intervention: I Am  Activity: Patients were given a worksheet with the letter I on it and instructed to write as many positive traits about themselves inside the letter I.  Education: LRT educated patients on ways they can increase their self-esteem.  Education Outcome: Patient did not attend group.  Clinical Observations/Feedback: Patient did not attend group.  Jacquelynn Cree, LRT/CTRS 12/16/2015 3:55 PM

## 2015-12-17 MED ORDER — FLUVOXAMINE MALEATE 50 MG PO TABS
100.0000 mg | ORAL_TABLET | Freq: Every day | ORAL | Status: DC
  Administered 2015-12-17: 100 mg via ORAL
  Filled 2015-12-17: qty 2

## 2015-12-17 NOTE — Progress Notes (Addendum)
[facility] MD Progress Note  12/17/2015 2:35 PM MYRLE DUES  MRN:  [id number]  Subjective:  Mr. Bunton feels depressed, anxious, and rather hopeless. He slept better last night. He seems to tolerate Luvox well, we'll increase the dose. He is now receiving Ensure. He denies any somatic complaints. He started participating in programming.   Principal Problem: Severe recurrent major depression without psychotic features (HCC) Diagnosis:   Patient Active Problem List   Diagnosis Date Noted  . Severe recurrent major depression without psychotic features (HCC) [F33.2] 12/15/2015  . Grief [F43.21] 12/15/2015  . Weight loss [R63.4] 12/15/2015  . OCD (obsessive compulsive disorder) [F42.9] 12/15/2015  . Benign prostatic hyperplasia with urinary obstruction [N40.1] 11/24/2015  . 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  Past Medical History:  Past Medical History  Diagnosis Date  . Meningitis     spinal  . OCD (obsessive compulsive disorder)   . Kidney stones     Past Surgical History  Procedure Laterality Date  . Kidney stone surgery     Family History:  Family History  Problem Relation Age of Onset  . Cancer Mother     bladder  . Emphysema Father   . Emphysema Sister   . COPD Sister   . Cancer Brother     prostate   Family Psychiatric  History: See H&P. Social History:  History  Alcohol Use No     History  Drug Use No    Social History   Social History  . Marital Status: Widowed    Spouse Name: N/A  . Number of Children: N/A  . Years of Education: N/A   Social History Main Topics  . Smoking status: Never Smoker   . Smokeless tobacco: Never Used  . Alcohol Use: No  . Drug Use: No  . Sexual Activity: Not Asked   Other Topics Concern  . None   Social History Narrative   Additional Social History:                         Sleep: Good  Appetite:   Poor  Current Medications: Current Facility-Administered Medications  Medication Dose Route Frequency Provider Last Rate Last Dose  . acetaminophen (TYLENOL) tablet 650 mg  650 mg Oral Q6H PRN [name], MD      . alum & mag hydroxide-simeth (MAALOX/MYLANTA) 200-200-20 MG/5ML suspension 30 mL  30 mL Oral Q4H PRN [name], MD      . feeding supplement (ENSURE ENLIVE) (ENSURE ENLIVE) liquid 237 mL  237 mL Oral TID BM Jolanta B Pucilowska, MD   237 mL at 12/17/15 1000  . fluvoxaMINE (LUVOX) tablet 100 mg  100 mg Oral QHS Jolanta B Pucilowska, MD      . magnesium hydroxide (MILK OF MAGNESIA) suspension 30 mL  30 mL Oral Daily PRN [name], MD      . megestrol (MEGACE) 400 MG/10ML suspension 400 mg  400 mg Oral Daily Jolanta B Pucilowska, MD   400 mg at 12/17/15 0959  . mirtazapine (REMERON) tablet 15 mg  15 mg Oral QHS Shari Prows, MD   15 mg at 12/16/15 2132  . tamsulosin (FLOMAX) capsule 0.4 mg  0.4 mg Oral QPC supper [name], MD   0.4 mg at 12/16/15 1756  . traZODone (DESYREL) tablet 100 mg  100 mg Oral QHS Jolanta  Holley Raring, MD   100 mg at 12/16/15 2132    Lab Results: No results found for this or any previous visit (from the past 48 hour(s)).  Blood Alcohol level:  Lab Results  Component Value Date   ETH <5 12/15/2015    Physical Findings: AIMS: Facial and Oral Movements Muscles of Facial Expression: None, normal Lips and Perioral Area: None, normal Jaw: None, normal Tongue: None, normal,Extremity Movements Upper (arms, wrists, hands, fingers): None, normal Lower (legs, knees, ankles, toes): None, normal, Trunk Movements Neck, shoulders, hips: None, normal, Overall Severity Severity of abnormal movements (highest score from questions above): None, normal Incapacitation due to abnormal movements: None, normal Patient's awareness of abnormal movements (rate only patient's report): No Awareness, Dental Status Current problems with teeth and/or  dentures?: No Does patient usually wear dentures?: No  CIWA:  CIWA-Ar Total: 0 COWS:  COWS Total Score: 2  Musculoskeletal: Strength & Muscle Tone: within normal limits Gait & Station: normal Patient leans: N/A  Psychiatric Specialty Exam: Review of Systems  Constitutional: Positive for weight loss.  Psychiatric/Behavioral: The patient is nervous/anxious.   All other systems reviewed and are negative.   Blood pressure 114/86, pulse 80, temperature 97 F (36.1 C), temperature source Oral, resp. rate 20, height  (1.753 m), weight 74.844 kg (165 lb), SpO2 99 %.Body mass index is 24.36 kg/(m^2).  General Appearance: Casual  Eye Contact::  Good  Speech:  Clear and Coherent  Volume:  Normal  Mood:  Depressed and Hopeless  Affect:  Congruent  Thought Process:  Goal Directed  Orientation:  Full (Time, Place, and Person)  Thought Content:  WDL  Suicidal Thoughts:  Yes.  with intent/plan  Homicidal Thoughts:  No  Memory:  Immediate;   Fair Recent;   Fair Remote;   Fair  Judgement:  Fair  Insight:  Fair  Psychomotor Activity:  Normal  Concentration:  Fair  Recall:  [employer] of Knowledge:Fair  Language: Fair  Akathisia:  No  Handed:  Right  AIMS (if indicated):     Assets:  Communication Skills Desire for Improvement Financial Resources/Insurance Housing Physical Health Resilience Social Support  ADL's:  Intact  Cognition: WNL  Sleep:  Number of Hours: 8   Treatment Plan Summary: Daily contact with patient to assess and evaluate symptoms and progress in treatment and Medication management   Mr. Tuckey is a 65 year old male with a history of anxiety and depression admitted for worsening of his symptoms, severe weight loss, and insomnia in the context of recent major loss.  1. Suicidal ideation. The patient is able to contract for safety in the hospital.   2. Mood and anxiety. We increased Luvox to 100 mg for depression and OCD type of symptoms. We discontinued  Ativan due to effects on cognition in elderly.  3. Insomnia. We offered trazodone.  4. BPH. We continue Flomax.  5. Failure to thrive. We started Megace and ensure.   6. Disposition. The patient will be discharged home. He needs a follow-up appointment with a psychiatrist and therapist. He will go to [facility].   Kristine Linea, MD 12/17/2015, 2:35 PM

## 2015-12-17 NOTE — BHH Group Notes (Signed)
[facility] LCSW Group Therapy  12/17/2015 12:55 PM  Type of Therapy:  Group Therapy  Participation Level:  Minimal  Participation Quality:  Appropriate and Attentive  Affect:  Depressed  Cognitive:  Alert, Appropriate and Oriented  Insight:  Engaged  Engagement in Therapy:  Improving  Modes of Intervention:  Socialization and Support  Summary of Progress/Problems: Patient attended group and participated minimally introducing himself and sharing his self-care activity is "fishing and being around family". Patient shared that his emotions became out of balance due to losing his wife and is still trying to find something to help him with balance in life.    Lulu Riding, MSW, LCSWA 12/17/2015, 12:55 PM

## 2015-12-17 NOTE — Tx Team (Signed)
Interdisciplinary Treatment Plan Update (Adult)         Date: 12/17/2015   Time Reviewed: 9:30 AM   Progress in Treatment: Improving Attending groups: Yes  Participating in groups: Yes  Taking medication as prescribed: Yes  Tolerating medication: Yes  Family/Significant other contact made: No, CSW still assessing for appropriate contacts Patient understands diagnosis: Yes  Discussing patient identified problems/goals with staff: Yes  Medical problems stabilized or resolved: Yes  Denies suicidal/homicidal ideation: Yes  Issues/concerns per patient self-inventory: Yes  Other:   New problem(s) identified: N/A   Discharge Plan or Barriers: Pt plans to return to [place] to live with his family and follow up with [facility] for outpatient mental health services, medication management and therapy and with [facility] for his medical hospital follow up    Reason for Continuation of Hospitalization:   Depression   Anxiety   Medication Stabilization   Comments: N/A   Estimated length of stay: 3-5 days     Pt is a 65 year old male admitted for SI and worsening symptoms of depression.  History of present illness. Information was obtained from the patient and the chart. The patient has a long history of anxiety with OCD type of symptoms. He receives treatment for his symptoms periodically from his primary care provider. He became severely depressed and anxious after his wife passed away in Feb 15, 2015. He lost 35 pounds since. He reports poor sleep, decreased appetite, anhedonia, hopelessness worthlessness, poor energy and lack of interest, social isolation crying spells, worsening of his anxiety including OCD symptoms and suicidal ideation. The patient has been trying to deal with it the best he could but has been struggling recently. He go grief counseling and it was somewhat helpful. He has been getting Remeron and Ativan from his primary care provider with some relief. He did not  see psychiatrist in spite of PCP recommendation. His OCD symptoms include excessive worries checking and cleaning. He sometimes has thoughts of not wanting to press on. He denies psychotic symptoms. He denies symptoms suggestive of bipolar disorder. He does not use drugs or alcohol.  Past psychiatric history. He has never been hospitalized. There were no suicide attempts. He has diagnoses of OCD. He does not remember the names of medications that have been tried.  Family psychiatric history. Father with mental illness mostly anxiety and depression.  Social history. He retired in March of last year. His wife passed away in 2023/02/15. He lives by himself. He has 2 adult children and grandchildren nearby but the patient has very little interest in interacting with his family even though he loves them dearly. He experiences some financial difficulties. He is poorly mostly because he does not cook to save the money on Merchandiser, retail. He has to my private insurance. It causes financial strain.  Patient lives in [place].  Patient will benefit from crisis stabilization, medication evaluation, group therapy, and psycho education in addition to case management for discharge planning.  Patient and CSW reviewed pt's identified goals and treatment plan. Pt verbalized understanding and agreed to treatment plan.  Patient is a 65 year old male with a history of depression and anxiety.  Pt's chief complaint. "I don't think I can handle it any longer."     Review of initial/current patient goals per problem list:  1. Goal(s): Patient will participate in aftercare plan   Met: Yes  Target date: 3-5 days post admission date   As evidenced by: Patient will participate within aftercare  plan AEB aftercare provider and housing plan at discharge being identified.   2/3: Pt plans to return to [place] to live with his family and follow up with [facility] for outpatient mental health services, medication management and therapy and with  [place] for his medical hospital follow up    2. Goal (s): Patient will exhibit decreased depressive symptoms and suicidal ideations.   Met: No  Target date: 3-5 days post admission date   As evidenced by: Patient will utilize self-rating of depression at 3 or below and demonstrate decreased signs of depression or be deemed stable for discharge by MD.   2/3: Goal progressing.    3. Goal(s): Patient will demonstrate decreased signs and symptoms of anxiety.   Met: No  Target date: 3-5 days post admission date   As evidenced by: Patient will utilize self-rating of anxiety at 3 or below and demonstrated decreased signs of anxiety, or be deemed stable for discharge by MD   2/3: Goal progressing.    4. Goal(s): Patient will demonstrate decreased signs of withdrawal due to substance abuse   Met: Yes  Target date: 3-5 days post admission date   As evidenced by: Patient will produce a CIWA/COWS score of 0, have stable vitals signs, and no symptoms of withdrawal  2/28: Goal progressing. 3/2: Patient produced a CIWA/COWS score of 0, has stable vitals signs, and no symptoms of withdrawal      5. Goal (s): Patient will demonstrate decreased signs of mania  * Met: No * Target date: 3-5 days post admission date  * As evidenced by: Patient demonstrate decreased signs of mania AEB decreased mood instability and demonstration of stable mood   2/3: Goal progressing.     Attendees:  Patient:  Family:  Physician: Dr. Bary Leriche, MD 12/17/2015 9:30 AM  Nursing: , RN  12/17/2015 9:30 AM  Clinical Social Worker: Marylou Flesher, [place]  12/17/2015 9:30 AM  Nursing: Polly Cobia RN 12/17/2015 9:30 AM  Nursing: Nicanor Bake, RN 12/17/2015 9:30 AM  Nursing: , RN     12/17/2015 9:30 AM  Clinical Social Worker: [place], [place] 12/17/2015 9:30 AM  Clinical Social Worker: Carmell Austria, LCSWA 12/17/2015 9:30 AM

## 2015-12-17 NOTE — Plan of Care (Signed)
Problem: Alteration in mood Goal: LTG-Pt's behavior demonstrates decreased signs of depression (Patient's behavior demonstrates decreased signs of depression to the point the patient is safe to return home and continue treatment in an outpatient setting)  Outcome: Not Progressing Affect flat.  States "I know I'm sad and I don't know what to do without my wife"

## 2015-12-17 NOTE — Progress Notes (Signed)
D: Patient stated slept fair last night .Stated appetite is fairand energy level  Is normal. Stated concentration is good . Stated on Depression scale 3, hopeless 3 and anxiety 3 .( low 0-10 high) Denies suicidal  homicidal ideations  .  No auditory hallucinations  No pain concerns . Appropriate ADL'S. Interacting with peers and staff.  A: Encourage patient participation with unit programming . Instruction  Given on  Medication , verbalize understanding. R: Voice no other concerns. Staff continue to monitor

## 2015-12-17 NOTE — Plan of Care (Signed)
Problem: [facility] Participation in Recreation Therapeutic Interventions Goal: STG-Patient will demonstrate improved self esteem by identif STG: Self-Esteem - Within 4 treatment sessions, patient will verbalize at least 5 positive affirmation statements in each of 2 treatment sessions to increase self-esteem post d/c.  Outcome: Progressing Treatment Session 1; Completed 1 out of 2: At approximately 12:15 pm, LRT met with patient in consultation room. Patient verbalized 5 positive affirmation statements. Patient became tearful and reported it felt "alright". LRT encouraged patient to continue saying positive affirmation statements. Intervention Used: I Am statements  Leonette Monarch, LRT/CTRS 03.02.17 2:26 pm Goal: STG-Other Recreation Therapy Goal (Specify) STG: Stress Management - Within 4 treatment sessions, patient will verbalize understanding of the stress management techniques in each of 2 treatment sessions to increase stress management skills post d/c.  Outcome: Progressing Treatment Session 1; Completed 1 out of 2: At approximately 12:15 pm, LRT met with patient in consultation room. LRT educated and provided patient with handouts on stress management techniques. Patient verbalized understanding. LRT encouraged patient to read over and practice the stress management techniques. Intervention Used: Stress Management handouts  Leonette Monarch, LRT/CTRS 03.02.17 2:27 pm

## 2015-12-17 NOTE — [facility] Group Notes (Signed)
[facility] Group Notes:  (Nursing/MHT/Case Management/Adjunct)  Date:  12/17/2015  Time:  8:47 AM  Type of Therapy:  Community Meeting   Participation Level:  Active  Participation Quality:  Appropriate, Attentive and Sharing  Affect:  Depressed and Flat  Cognitive:  Alert, Appropriate and Oriented  Insight:  Good  Engagement in Group:  Engaged  Modes of Intervention:  Discussion and Education  Summary of Progress/Problems:  Dillon [name] Dillon [name] 12/17/2015, 8:47 AM

## 2015-12-17 NOTE — Progress Notes (Signed)
D: Pt in room upon approach. Affect noted to be brighter than yesterday evening. Denies SI/AVH. States that "Today was a better more peaceful day" Appropriate interaction with staff and peers. A: Encouragement and support provided. Q15 minute checks maintained for safety. Medications given as prescribed. R: Pt remains safe on unit. Attended evening group. Compliant with medications. Voiced no additional concerns. Will continue to monitor.

## 2015-12-17 NOTE — Progress Notes (Signed)
D: Pt in room upon approach. Denies SI/AVH. Isolative but appropriate interaction with staff. Denies pain. A: Medications given as prescribed. Q15 minute checks maintained for safety. Encouragement and support provided. R: Pt remains safe on unit. Voices no additional concerns. Will continue to monitor.

## 2015-12-17 NOTE — [facility] Group Notes (Signed)
[facility] Group Notes:  (Nursing/MHT/Case Management/Adjunct)  Date:  12/17/2015  Time:  1:55 PM  Type of Therapy:  Psychoeducational Skills  Participation Level:  Minimal  Participation Quality:  Attentive  Affect:  Flat  Cognitive:  Appropriate  Insight:  Improving  Engagement in Group:  Limited  Modes of Intervention:  Discussion and Education  Summary of Progress/Problems:  Marquette Old 12/17/2015, 1:55 PM

## 2015-12-17 NOTE — Progress Notes (Signed)
Recreation Therapy Notes  Date: 03.02.17 Time: 3:00 pm Location: Community Room  Group Topic: Leisure Education  Goal Area(s) Addresses:  Patient will write down one healthy leisure activity. Patient will verbalize benefit of using leisure as a Associate Professor.  Behavioral Response: Attentive  Intervention: Leisure Time  Activity: Patients were instructed to write one healthy leisure activity. Patients were given a Leisure Time Clock worksheet and instructed to fill it out. Patients were asked to give 10 positive emotions. Patients connected the leisure activities to the positive emotions.  Education: LRT educated patients on what they need to participate in leisure.  Education Outcome: In group clarification offered  Clinical Observations/Feedback: Patient wrote healthy leisure activity. Patient completed Leisure Time Clock worksheet. Patient did not verbalize a positive emotion. Patient did not connect the healthy leisure activities to the positive emotions. Patient did not contribute to group discussion.  Jacquelynn Cree, LRT/CTRS 12/17/2015 4:33 PM

## 2015-12-18 MED ORDER — FLUVOXAMINE MALEATE 50 MG PO TABS
150.0000 mg | ORAL_TABLET | Freq: Every day | ORAL | Status: DC
  Administered 2015-12-18 – 2015-12-20 (×3): 150 mg via ORAL
  Filled 2015-12-18 (×4): qty 3

## 2015-12-18 NOTE — [facility] Group Notes (Signed)
[facility] LCSW Group Therapy  12/18/2015 2:49 PM  Type of Therapy:  Group Therapy  Participation Level:  Active  Participation Quality:  Appropriate and Attentive  Affect:  Appropriate  Cognitive:  Alert, Appropriate and Oriented  Insight:  Improving  Engagement in Therapy:  Engaged  Modes of Intervention:  Discussion, Education, Socialization and Support  Summary of Progress/Problems: Patient attended group and was attentive throughout group session as a video was shown "Happy" by [employer] and [employer] and by [employer] Happy. Patient shared that happiness to him was spending time with family.   Lulu RidingIngle, Adalyne Lovick T, MSW, LCSWA 12/18/2015, 2:49 PM

## 2015-12-18 NOTE — Progress Notes (Signed)
[facility] MD Progress Note  12/18/2015 12:26 PM Dillon [name]  MRN:  [id number]  Subjective: Dillon [name] is a 65 year old male with a history of OCD admitted for worsening of anxiety, depression, failure to thrive and suicidal ideation in the context of major loss. His wife of 40 some years passed away in 03/16/2015. He lives by himself but has supportive family. He was treated with Remeron and Ativan by his PCP. We added Luvox for OCD and trazodone for sleep.  Dillon [name] still complains of depression and severe anxiety. Now he worries about the cost of medications and the fact that he never feels hungry. He is on Megace here and get insure along with his meals. He seems to be adequately. She denies somatic symptoms. He tolerates medications well. He participates well in groups  Principal Problem: Severe recurrent major depression without psychotic features (HCC) Diagnosis:   Patient Active Problem List   Diagnosis Date Noted  . Severe recurrent major depression without psychotic features (HCC) [F33.2] 12/15/2015  . Grief [F43.21] 12/15/2015  . Weight loss [R63.4] 12/15/2015  . OCD (obsessive compulsive disorder) [F42.9] 12/15/2015  . Benign prostatic hyperplasia with urinary obstruction [N40.1] 11/24/2015  . 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  Past Medical History:  Past Medical History  Diagnosis Date  . Meningitis     spinal  . OCD (obsessive compulsive disorder)   . Kidney stones     Past Surgical History  Procedure Laterality Date  . Kidney stone surgery     Family History:  Family History  Problem Relation Age of Onset  . Cancer Mother     bladder  . Emphysema Father   . Emphysema Sister   . COPD Sister   . Cancer Brother     prostate   Family Psychiatric  History: None reported. Social History:  History  Alcohol Use No     History  Drug Use No    Social History    Social History  . Marital Status: Widowed    Spouse Name: N/A  . Number of Children: N/A  . Years of Education: N/A   Social History Main Topics  . Smoking status: Never Smoker   . Smokeless tobacco: Never Used  . Alcohol Use: No  . Drug Use: No  . Sexual Activity: Not Asked   Other Topics Concern  . None   Social History Narrative   Additional Social History:                         Sleep: Fair  Appetite:  Poor  Current Medications: Current Facility-Administered Medications  Medication Dose Route Frequency Provider Last Rate Last Dose  . acetaminophen (TYLENOL) tablet 650 mg  650 mg Oral Q6H PRN [name], MD      . alum & mag hydroxide-simeth (MAALOX/MYLANTA) 200-200-20 MG/5ML suspension 30 mL  30 mL Oral Q4H PRN [name], MD      . feeding supplement (ENSURE ENLIVE) (ENSURE ENLIVE) liquid 237 mL  237 mL Oral TID BM Rorey Bisson B Kayren Holck, MD   237 mL at 12/18/15 1000  . fluvoxaMINE (LUVOX) tablet 100 mg  100 mg Oral QHS Shari Prows, MD   100 mg at 12/17/15 2142/03/15  . magnesium hydroxide (MILK OF MAGNESIA) suspension 30 mL  30 mL Oral Daily PRN [name], MD      .  megestrol (MEGACE) 400 MG/10ML suspension 400 mg  400 mg Oral Daily Shari Prows, MD   400 mg at 12/18/15 0922  . mirtazapine (REMERON) tablet 15 mg  15 mg Oral QHS Shari Prows, MD   15 mg at 12/17/15 2143  . tamsulosin (FLOMAX) capsule 0.4 mg  0.4 mg Oral QPC supper [name], MD   0.4 mg at 12/17/15 1651  . traZODone (DESYREL) tablet 100 mg  100 mg Oral QHS Shari Prows, MD   100 mg at 12/17/15 2143    Lab Results: No results found for this or any previous visit (from the past 48 hour(s)).  Blood Alcohol level:  Lab Results  Component Value Date   ETH <5 12/15/2015    Physical Findings: AIMS: Facial and Oral Movements Muscles of Facial Expression: None, normal Lips and Perioral Area: None, normal Jaw: None, normal Tongue: None,  normal,Extremity Movements Upper (arms, wrists, hands, fingers): None, normal Lower (legs, knees, ankles, toes): None, normal, Trunk Movements Neck, shoulders, hips: None, normal, Overall Severity Severity of abnormal movements (highest score from questions above): None, normal Incapacitation due to abnormal movements: None, normal Patient's awareness of abnormal movements (rate only patient's report): No Awareness, Dental Status Current problems with teeth and/or dentures?: No Does patient usually wear dentures?: No  CIWA:  CIWA-Ar Total: 0 COWS:  COWS Total Score: 2  Musculoskeletal: Strength & Muscle Tone: within normal limits Gait & Station: normal Patient leans: N/A  Psychiatric Specialty Exam: Review of Systems  Constitutional: Positive for weight loss.  Psychiatric/Behavioral: The patient is nervous/anxious.   All other systems reviewed and are negative.   Blood pressure 107/86, pulse 61, temperature 98 F (36.7 C), temperature source Oral, resp. rate 18, height  (1.753 m), weight 74.844 kg (165 lb), SpO2 99 %.Body mass index is 24.36 kg/(m^2).  General Appearance: Casual  Eye Contact::  Good  Speech:  Clear and Coherent  Volume:  Normal  Mood:  Depressed, Hopeless and Worthless  Affect:  Blunt  Thought Process:  Goal Directed  Orientation:  Full (Time, Place, and Person)  Thought Content:  WDL  Suicidal Thoughts:  Yes.  with intent/plan  Homicidal Thoughts:  No  Memory:  Immediate;   Fair Recent;   Fair Remote;   Fair  Judgement:  Fair  Insight:  Fair  Psychomotor Activity:  Normal  Concentration:  Fair  Recall:  [employer] of Knowledge:Fair  Language: Fair  Akathisia:  No  Handed:  Right  AIMS (if indicated):     Assets:  Communication Skills Desire for Improvement Financial Resources/Insurance Housing Physical Health Resilience Social Support  ADL's:  Intact  Cognition: WNL  Sleep:  Number of Hours: 6.45   Treatment Plan Summary: Daily  contact with patient to assess and evaluate symptoms and progress in treatment and Medication management   Dillon [name] is a 65 year old male with a history of anxiety and depression admitted for worsening of his symptoms, severe weight loss, and insomnia in the context of recent major loss.  1. Suicidal ideation. The patient is able to contract for safety in the hospital.   2. Mood and anxiety. We increase Luvox to 150 mg for depression and OCD type of symptoms. We discontinued Ativan due to effects on cognition in elderly.  3. Insomnia. We offered trazodone.  4. BPH. We continue Flomax.  5. Failure to thrive. We started Megace and Ensure. He complains of an early satiety. We'll check weight on Monday.  6. Disposition. The patient will be discharged home. He will follow-up with [facility].  Kristine LineaJolanta Waldron Gerry, MD 12/18/2015, 12:26 PM

## 2015-12-18 NOTE — Progress Notes (Signed)
Recreation Therapy Notes  Date: 03.03.17 Time: 3:00 pm Location: Craft Room  Group Topic: Problem solving, Teamwork, Communication  Goal Area(s) Addresses:  Patient will effectively work with peer towards shared goal. Patient will identify skills used to make activity successful. Patient will identify benefit of use group skills effectively post d/c.  Behavioral Response: Attentive, Interactive  Intervention: [employer] Tower  Activity: Patients were given 15 pipe cleaners and instructed to build a free standing tower. After approximately 3-5 minutes, patients were told to put their dominant hand behind their back. After approximately 3-5 minutes, patients were told to stop talking to each other.   Education: LRT educated patients on healthy support systems.  Education Outcome: In group clarification offered   Clinical Observations/Feedback: Patient worked with team to build tower until a team member ruined the tower. Patient asked to leave to get his clothes. Patient left group at approximately 3:25 pm. Patient returned to group at approximately 3:46 pm. Patient did not contribute to group discussion.  Jacquelynn CreeGreene,Quanesha Klimaszewski M, LRT/CTRS 12/18/2015 4:23 PM

## 2015-12-18 NOTE — [facility] Group Notes (Signed)
[facility] Group Notes:  (Nursing/MHT/Case Management/Adjunct)  Date:  12/18/2015  Time:  1:21 AM  Type of Therapy:  Group Therapy  Participation Level:  Active  Participation Quality:  Appropriate  Affect:  Flat  Cognitive:  Appropriate  Insight:  Appropriate  Engagement in Group:  Engaged  Modes of Intervention:  n/a  Summary of Progress/Problems:  Dillon [name] 12/18/2015, 1:21 AM

## 2015-12-18 NOTE — Progress Notes (Signed)
D: Dillon [name] denied SI/HI/AVH and pain. He interacted appropriate with this Clinical research associatewriter and with a Consulting civil engineerstudent. On his self-inventory sheet, he rated his depression, hopelessness, and anxiety all a two. He reported good sleep, fair appetite, low energy, and good concentration. His only complaint to this writer was that his appetite was not what he hoped it could be. A: Meds given as ordered. Q15 safety checks maintained. Support/encouragement offered. R: Pt remains free from harm and continues with treatment. Will continue to monitor for needs/safety.

## 2015-12-18 NOTE — Progress Notes (Signed)
Patient ID: Dillon PriestRobert H Collymore, male   DOB: 1950-10-19, 65 y.o.   MRN: [id number] CSW spoke to the pt's daughter Anibal HendersonKelley Weckwerth at ph: and pt's daughter stated that the pt had been slowly declining mentally suffering from extreme depression since last April, but that he has never said he was suicidal in the past and before he was admitted.

## 2015-12-18 NOTE — [facility] Suicide Risk Assessment (Signed)
[facility] INPATIENT:  Family/Significant Other Suicide Prevention Education  Suicide Prevention Education:  Education Completed; with the pt's daughter Fenton FoyKelly Vollrath at ph: [phone],  (name of family member/significant other) has been identified by the patient as the family member/significant other with whom the patient will be residing, and identified as the person(s) who will aid the patient in the event of a mental health crisis (suicidal ideations/suicide attempt).  With written consent from the patient, the family member/significant other has been provided the following suicide prevention education, prior to the and/or following the discharge of the patient.  CSW completed SPE with the pt.  The suicide prevention education provided includes the following:  Suicide risk factors  Suicide prevention and interventions  National Suicide Hotline telephone number  [facility] assessment telephone number  [facility] Emergency Assistance 911  [facility] and/or Residential Mobile Crisis Unit telephone number  Request made of family/significant other to:  Remove weapons (e.g., guns, rifles, knives), all items previously/currently identified as safety concern.    Remove drugs/medications (over-the-counter, prescriptions, illicit drugs), all items previously/currently identified as a safety concern.  The family member/significant other verbalizes understanding of the suicide prevention education information provided.  The family member/significant other agrees to remove the items of safety concern listed above.  Dorothe PeaJonathan F Tnia Anglada 12/18/2015, 3:01 PM

## 2015-12-18 NOTE — [facility] Group Notes (Signed)
[facility]   12/18/2015 3:41 PM  Participation Quality:  Patient attended and introduced himself sharing his SMART goal is to "concentrate on getting better, push myself forward, and stay positive".   Mood/Affect:  Appropriate  Depression Rating:  4  Anxiety Rating:  4  Thoughts of Suicide:  No Will you contract for safety?   NA  Current AVH:  No  Plan for Discharge/Comments:  Can discharge home and plans to attend [facility] for follow up  Transportation Means: has family to transport at discharge  Supports: has family , identified outpatient follow up and has a place to live.   Lulu RidingIngle, Etter Royall T, MSW, LCSWA

## 2015-12-18 NOTE — Plan of Care (Signed)
Problem: [facility] Participation in Recreation Therapeutic Interventions Goal: STG-Patient will demonstrate improved self esteem by identif STG: Self-Esteem - Within 4 treatment sessions, patient will verbalize at least 5 positive affirmation statements in each of 2 treatment sessions to increase self-esteem post d/c.  Outcome: Completed/Met Date Met:  12/18/15 Treatment Session 2; Completed 2 out of 2: At approximately 12:45 pm, LRT met with patient in patient room. Patient verbalized 5 positive affirmation statements. Patient reported, "It's starting to come around more." LRT encouraged patient to continue saying positive affirmation statements. Intervention Used: I Am statements  Leonette Monarch, LRT/CTRS 03.03.17 1:50 pm Goal: STG-Other Recreation Therapy Goal (Specify) STG: Stress Management - Within 4 treatment sessions, patient will verbalize understanding of the stress management techniques in each of 2 treatment sessions to increase stress management skills post d/c.  Outcome: Completed/Met Date Met:  12/18/15 Treatment Session 2; Completed 2 out of 2: At approximately 12:45 pm, LRT met with patient in patient room. Patient verbalized understanding and requested for enlarged print of the techniques. Patient encouraged patient to use the techniques and will provided patient with enlarged print handouts. Intervention Used: Stress Management handouts  Leonette Monarch, LRT/CTRS 03.03.17 1:51 pm

## 2015-12-18 NOTE — [facility] Group Notes (Signed)
[facility] Group Notes:  (Nursing/MHT/Case Management/Adjunct)  Date:  12/18/2015  Time:  2:48 PM  Type of Therapy:  Psychoeducational Skills  Participation Level:  Minimal  Participation Quality:  Attentive and Supportive  Affect:  Flat  Cognitive:  Appropriate  Insight:  Appropriate  Engagement in Group:  Limited  Modes of Intervention:  Discussion and Education  Summary of Progress/Problems:  Dillon [name] 12/18/2015, 2:48 PM

## 2015-12-18 NOTE — Plan of Care (Signed)
Problem: Ineffective individual coping Goal: STG: Patient will remain free from self harm Outcome: Progressing Pt denies SI and remains free from self harm.     

## 2015-12-18 NOTE — Progress Notes (Signed)
Initial Nutrition Assessment    INTERVENTION:   Meals and Snacks: Cater to patient preferences, agree with regular diet, may benefit from double portions or snacks between meals Medical Food Supplement Therapy: continue Ensure TID between meals Nutrition related medication management: pt may benefit from addition of MVI  NUTRITION DIAGNOSIS:   Unintentional weight loss related to social / environmental circumstances, poor appetite as evidenced by percent weight loss.  GOAL:   Patient will meet greater than or equal to 90% of their needs  MONITOR:    (Energy Intake, Anthropometrics)  REASON FOR ASSESSMENT:   Malnutrition Screening Tool    ASSESSMENT:    Pt admitted with severe recurrent major depression without psychotic features  Past Medical History  Diagnosis Date  . Meningitis     spinal  . OCD (obsessive compulsive disorder)   . Kidney stones      Diet Order:  Diet regular Room service appropriate?: Yes; Fluid consistency:: Thin   Energy Intake: recorded po intake 76% of meals on average   Recent Labs Lab 12/15/15 0952  NA 141  K 4.9  CL 104  CO2 24  BUN 24*  CREATININE 1.56*  CALCIUM 10.9*  GLUCOSE 111*    Glucose Profile: No results for input(s): GLUCAP in the last 72 hours. Protein Profile:   Recent Labs Lab 12/15/15 0952  ALBUMIN 5.0   Meds: megace, remeron  Height:   Ht Readings from Last 1 Encounters:  12/15/15 5\' 9"  (1.753 m)    Weight: 18.4% wt loss since in 3-4 months per weight encounters  Wt Readings from Last 1 Encounters:  12/15/15 165 lb (74.844 kg)   Wt Readings from Last 10 Encounters:  12/15/15 165 lb (74.844 kg)  12/15/15 180 lb (81.647 kg)  12/15/15 168 lb 6.4 oz (76.386 kg)  12/01/15 182 lb 3.2 oz (82.645 kg)  09/07/15 206 lb 12.8 oz (93.804 kg)   BMI:  Body mass index is 24.36 kg/(m^2).  LOW Care Level  Romelle StarcherCate Maziyah Vessel MS, [place]RD, LDN [phone] Pager  [phone] Weekend/On-Call Pager

## 2015-12-19 MED ORDER — TRAZODONE HCL 50 MG PO TABS
50.0000 mg | ORAL_TABLET | Freq: Every day | ORAL | Status: DC
Start: 2015-12-19 — End: 2015-12-21
  Administered 2015-12-19 – 2015-12-20 (×2): 50 mg via ORAL
  Filled 2015-12-19 (×3): qty 1

## 2015-12-19 NOTE — Plan of Care (Signed)
Problem: Ineffective individual coping Goal: LTG: Patient will report a decrease in negative feelings Outcome: Progressing Pt reported depression 4/10 and indicated that he feel's like he's getting better

## 2015-12-19 NOTE — Progress Notes (Signed)
D: Observed pt in room. Patient alert and oriented x4. Patient denies SI/HI/AVH. Pt affect is depressed. When asked about mood pt stated "it's pretty good...getting better." Pt rated depression 4/10 and anxiety 4/10. Pt told Clinical research associatewriter he has been eating at least 60% of his meals. Pt indicated that he was going to group and learning "to strive to have a better outlook." Pt had no other complaints. A: Offered active listening and support. Provided therapeutic communication. Administered scheduled medications.  R: Pt pleasant and cooperative. Pt medication compliant. Will continue Q15 min. checks. Safety maintained.

## 2015-12-19 NOTE — Progress Notes (Signed)
Pt has been pleasant and  Cooperative . Pt denies SI and A/V hallucinations.. Pt has  been attending all unit activities. Pt's mood and affect has been depressed.

## 2015-12-19 NOTE — [facility] Group Notes (Signed)
[facility] LCSW Group Therapy  12/19/2015 2:24 PM  Type of Therapy: Group Therapy  Participation Level: Minimal  Participation Quality: Attentive  Affect: Flat  Cognitive: Alert  Insight: Limited  Engagement in Therapy: Limited  Modes of Intervention: Discussion, Education, Socialization and Support  Summary of Progress/Problems: Mindfulness: Patient discussed mindfulness and relaxing techniques and why they are beneficial. Pt discussed ways to incorporate mindfulness in their lives. Pt practiced a mindfulness techique and discussed how it made them feel. Pt attended group and stayed the entire time. He sat quietly and participated in activity.   Lakendra Helling L Kelin Nixon MSW, LCSWA  12/19/2015, 2:24 PM  

## 2015-12-19 NOTE — Progress Notes (Signed)
[facility]  12/19/2015 6:00 PM Dillon PriestRobert H [name]  MRN:  [id number]  Subjective: [name] is a 65 year old male with a history of OCD admitted for worsening of anxiety, depression, failure to thrive and suicidal ideation in the context of major loss. He reported that his wife died last year due to kidney disease. He has been feeling depressed but denied having any suicidal ideations. He reported that he was started on Remeron by his primary care physician and was recently started on Luvox. He stated that he does not feel motivated. He feels that he is becoming very tired on the trazodone and we discussed about decreasing the dose and he agreed with the plan.    [name] still complains of depression and severe anxiety. Now he worries about the cost of medications and the fact that he never feels hungry. He is on Megace here and get insure along with his meals. He seems to be adequately.He denies somatic symptoms. He tolerates medications well. He participates well in groups  Principal Problem: Severe recurrent major depression without psychotic features (HCC) Diagnosis:   Patient Active Problem List   Diagnosis Date Noted  . Severe recurrent major depression without psychotic features (HCC) [F33.2] 12/15/2015  . Grief [F43.21] 12/15/2015  . Weight loss [R63.4] 12/15/2015  . OCD (obsessive compulsive disorder) [F42.9] 12/15/2015  . Benign prostatic hyperplasia with urinary obstruction [N40.1] 11/24/2015  . 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  Past Medical History:  Past Medical History  Diagnosis Date  . Meningitis     spinal  . OCD (obsessive compulsive disorder)   . Kidney stones     Past Surgical History  Procedure Laterality Date  . Kidney stone surgery     Family History:  Family History  Problem Relation Age of Onset  . Cancer Mother     bladder  . Emphysema  Father   . Emphysema Sister   . COPD Sister   . Cancer Brother     prostate   Family Psychiatric  History: None reported. Social History:  History  Alcohol Use No     History  Drug Use No    Social History   Social History  . Marital Status: Widowed    Spouse Name: N/A  . Number of Children: N/A  . Years of Education: N/A   Social History Main Topics  . Smoking status: Never Smoker   . Smokeless tobacco: Never Used  . Alcohol Use: No  . Drug Use: No  . Sexual Activity: Not Asked   Other Topics Concern  . None   Social History Narrative   Additional Social History:                         Sleep: Fair  Appetite:  Poor  Current Medications: Current Facility-Administered Medications  Medication Dose Route Frequency Provider Last Rate Last Dose  . acetaminophen (TYLENOL) tablet 650 mg  650 mg Oral Q6H PRN [name], MD      . alum & mag hydroxide-simeth (MAALOX/MYLANTA) 200-200-20 MG/5ML suspension 30 mL  30 mL Oral Q4H PRN [name], MD      . feeding supplement (ENSURE ENLIVE) (ENSURE ENLIVE) liquid 237 mL  237 mL Oral TID BM Jolanta B Pucilowska, MD   237 mL at 12/19/15 1713  . fluvoxaMINE (LUVOX) tablet 150 mg  150 mg  Oral QHS Shari Prows, MD   150 mg at 12/18/15 2155  . magnesium hydroxide (MILK OF MAGNESIA) suspension 30 mL  30 mL Oral Daily PRN [name], MD      . megestrol (MEGACE) 400 MG/10ML suspension 400 mg  400 mg Oral Daily Jolanta B Pucilowska, MD   400 mg at 12/19/15 0841  . mirtazapine (REMERON) tablet 15 mg  15 mg Oral QHS Shari Prows, MD   15 mg at 12/18/15 2155  . tamsulosin (FLOMAX) capsule 0.4 mg  0.4 mg Oral QPC supper [name], MD   0.4 mg at 12/19/15 1714  . traZODone (DESYREL) tablet 50 mg  50 mg Oral QHS Brandy Hale, MD        Lab Results: No results found for this or any previous visit (from the past 48 hour(s)).  Blood Alcohol level:  Lab Results  Component Value Date   ETH <5  12/15/2015    Physical Findings: AIMS: Facial and Oral Movements Muscles of Facial Expression: None, normal Lips and Perioral Area: None, normal Jaw: None, normal Tongue: None, normal,Extremity Movements Upper (arms, wrists, hands, fingers): None, normal Lower (legs, knees, ankles, toes): None, normal, Trunk Movements Neck, shoulders, hips: None, normal, Overall Severity Severity of abnormal movements (highest score from questions above): None, normal Incapacitation due to abnormal movements: None, normal Patient's awareness of abnormal movements (rate only patient's report): No Awareness, Dental Status Current problems with teeth and/or dentures?: No Does patient usually wear dentures?: No  CIWA:  CIWA-Ar Total: 0 COWS:  COWS Total Score: 2  Musculoskeletal: Strength & Muscle Tone: within normal limits Gait & Station: normal Patient leans: N/A  Psychiatric Specialty Exam: Review of Systems  Constitutional: Positive for weight loss.  Psychiatric/Behavioral: The patient is nervous/anxious.   All other systems reviewed and are negative.   Blood pressure 146/83, pulse 69, temperature 97.8 F (36.6 C), temperature source Oral, resp. rate 18, height  (1.753 m), weight 165 lb (74.844 kg), SpO2 99 %.Body mass index is 24.36 kg/(m^2).  General Appearance: Casual  Eye Contact::  Good  Speech:  Clear and Coherent  Volume:  Normal  Mood:  Depressed, Hopeless and Worthless  Affect:  Blunt  Thought Process:  Goal Directed  Orientation:  Full (Time, Place, and Person)  Thought Content:  WDL  Suicidal Thoughts:  Yes.  with intent/plan  Homicidal Thoughts:  No  Memory:  Immediate;   Fair Recent;   Fair Remote;   Fair  Judgement:  Fair  Insight:  Fair  Psychomotor Activity:  Normal  Concentration:  Fair  Recall:  [employer] of Knowledge:Fair  Language: Fair  Akathisia:  No  Handed:  Right  AIMS (if indicated):     Assets:  Communication Skills Desire for  Improvement Financial Resources/Insurance Housing Physical Health Resilience Social Support  ADL's:  Intact  Cognition: WNL  Sleep:  Number of Hours: 5   Treatment Plan Summary: Daily contact with patient to assess and evaluate symptoms and progress in treatment and Medication management   Mr. Revolorio is a 65 year old male with a history of anxiety and depression admitted for worsening of his symptoms, severe weight loss, and insomnia in the context of recent major loss.  1. Suicidal ideation. The patient is able to contract for safety in the hospital.   2. Mood and anxiety. We increase Luvox to 150 mg for depression and OCD type of symptoms.   3. Insomnia. We offered trazodone.  4.  BPH. We continue Flomax.  5. Failure to thrive. We started Megace and Ensure. He complains of an early satiety. We'll check weight on Monday.  6. Disposition. The patient will be discharged home. He will follow-up with [facility].  Brandy Hale, MD 12/19/2015, 6:00 PM

## 2015-12-20 NOTE — Plan of Care (Signed)
Problem: Consults Goal: [facility] General Treatment Patient Education Outcome: Progressing Actively participating in plan of care.      

## 2015-12-20 NOTE — Progress Notes (Signed)
Patient pleasant during shift. Patient sad and tearful when speaking of deceased wife. Patient states that anxiety and depression is getting better. Patient is medication compliant. Patient denies SI/HI/AVH. Safety maintained. Will continue to monitor.

## 2015-12-20 NOTE — [facility] Group Notes (Signed)
[facility] Group Notes:  (Nursing/MHT/Case Management/Adjunct)  Date:  12/20/2015  Time:  9:16 AM  Type of Therapy:  Community Meeting   Participation Level:  Did Not Attend  Dillon [name] 12/20/2015, 9:16 AM 

## 2015-12-20 NOTE — Progress Notes (Addendum)
Patient with sad affect, cooperative behavior with meals, meds and plan of care. No SI/HI at this time. Minimal interaction with peers. Verbalizes needs appropriately with staff. Writer gives patient clothes that night shift searched. No distress, safety maintained. Patient quiet in room, rests during free time. Drinks Supplemental shakes as ordered and safety maintained.

## 2015-12-20 NOTE — [facility] Group Notes (Signed)
[facility] LCSW Group Therapy  12/20/2015 3:07 PM  Type of Therapy:  Group Therapy  Participation Level:  Minimal  Participation Quality:  Attentive  Affect:  Flat  Cognitive:  Alert  Insight:  Limited  Engagement in Therapy:  Limited  Modes of Intervention:  Discussion, Education, Socialization and Support  Summary of Progress/Problems: Self esteem: Patients discussed self esteem and how it impacts them. They discussed what aspects in their lives has influenced their self esteem. They were challenged to identify changes that are needed in order to improve self esteem. Pt attended group and stayed the entire time. He sat quietly and listened to other group members.    Dillon [name] L Dillon [name] MSW, LCSWA  12/20/2015, 3:07 PM  

## 2015-12-20 NOTE — Progress Notes (Signed)
[facility] MD Progress Note  12/20/2015 3:35 PM Dillon [name]  MRN:  [id number]  Subjective: Dillon [name] is a 65 year old male with a history of depression ,OCD admitted for worsening of anxiety,failure to thrive and suicidal ideation in the context of major loss. He reported that he has started improving on the medications. He was noted to be sitting in his room on was reading the book. He reported that her medications are helping him. He is feeling better. He currently denied having any perceptual disturbances. He denied having any suicidal ideations today. He reported that the medications are  helping him. His wife died last year due to kidney disease. He has been feeling depressed but denied having any suicidal ideations.    .He denies somatic symptoms. He tolerates medications well. He participates well in groups  Principal Problem: Severe recurrent major depression without psychotic features (HCC) Diagnosis:   Patient Active Problem List   Diagnosis Date Noted  . Severe recurrent major depression without psychotic features (HCC) [F33.2] 12/15/2015  . Grief [F43.21] 12/15/2015  . Weight loss [R63.4] 12/15/2015  . OCD (obsessive compulsive disorder) [F42.9] 12/15/2015  . Benign prostatic hyperplasia with urinary obstruction [N40.1] 11/24/2015  . 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  Past Medical History:  Past Medical History  Diagnosis Date  . Meningitis     spinal  . OCD (obsessive compulsive disorder)   . Kidney stones     Past Surgical History  Procedure Laterality Date  . Kidney stone surgery     Family History:  Family History  Problem Relation Age of Onset  . Cancer Mother     bladder  . Emphysema Father   . Emphysema Sister   . COPD Sister   . Cancer Brother     prostate   Family Psychiatric  History: None reported. Social History:  History  Alcohol Use No      History  Drug Use No    Social History   Social History  . Marital Status: Widowed    Spouse Name: N/A  . Number of Children: N/A  . Years of Education: N/A   Social History Main Topics  . Smoking status: Never Smoker   . Smokeless tobacco: Never Used  . Alcohol Use: No  . Drug Use: No  . Sexual Activity: Not Asked   Other Topics Concern  . None   Social History Narrative   Additional Social History:                         Sleep: Fair  Appetite:  Poor  Current Medications: Current Facility-Administered Medications  Medication Dose Route Frequency Provider Last Rate Last Dose  . acetaminophen (TYLENOL) tablet 650 mg  650 mg Oral Q6H PRN [name], MD      . alum & mag hydroxide-simeth (MAALOX/MYLANTA) 200-200-20 MG/5ML suspension 30 mL  30 mL Oral Q4H PRN [name], MD      . feeding supplement (ENSURE ENLIVE) (ENSURE ENLIVE) liquid 237 mL  237 mL Oral TID BM Jolanta B Pucilowska, MD   237 mL at 12/20/15 1400  . fluvoxaMINE (LUVOX) tablet 150 mg  150 mg Oral QHS Shari Prows, MD   150 mg at 12/19/15 2213  . magnesium hydroxide (MILK OF MAGNESIA) suspension 30 mL  30 mL Oral Daily PRN [name], MD      .  megestrol (MEGACE) 400 MG/10ML suspension 400 mg  400 mg Oral Daily Jolanta B Pucilowska, MD   400 mg at 12/20/15 0913  . mirtazapine (REMERON) tablet 15 mg  15 mg Oral QHS Shari ProwsJolanta B Pucilowska, MD   15 mg at 12/19/15 2213  . tamsulosin (FLOMAX) capsule 0.4 mg  0.4 mg Oral QPC supper [name], MD   0.4 mg at 12/19/15 1714  . traZODone (DESYREL) tablet 50 mg  50 mg Oral QHS Brandy HaleUzma Sanjiv Castorena, MD   50 mg at 12/19/15 2213    Lab Results: No results found for this or any previous visit (from the past 48 hour(s)).  Blood Alcohol level:  Lab Results  Component Value Date   ETH <5 12/15/2015    Physical Findings: AIMS: Facial and Oral Movements Muscles of Facial Expression: None, normal Lips and Perioral Area: None, normal Jaw:  None, normal Tongue: None, normal,Extremity Movements Upper (arms, wrists, hands, fingers): None, normal Lower (legs, knees, ankles, toes): None, normal, Trunk Movements Neck, shoulders, hips: None, normal, Overall Severity Severity of abnormal movements (highest score from questions above): None, normal Incapacitation due to abnormal movements: None, normal Patient's awareness of abnormal movements (rate only patient's report): No Awareness, Dental Status Current problems with teeth and/or dentures?: No Does patient usually wear dentures?: No  CIWA:  CIWA-Ar Total: 0 COWS:  COWS Total Score: 2  Musculoskeletal: Strength & Muscle Tone: within normal limits Gait & Station: normal Patient leans: N/A  Psychiatric Specialty Exam: Review of Systems  Constitutional: Positive for weight loss.  Psychiatric/Behavioral: Positive for depression. The patient is nervous/anxious.   All other systems reviewed and are negative.   Blood pressure 122/72, pulse 66, temperature 97.7 F (36.5 C), temperature source Oral, resp. rate 18, height 5\' 9"  (1.753 m), weight 165 lb (74.844 kg), SpO2 99 %.Body mass index is 24.36 kg/(m^2).  General Appearance: Casual  Eye Contact::  Good  Speech:  Clear and Coherent  Volume:  Normal  Mood:  Depressed  Affect:  Blunt  Thought Process:  Goal Directed  Orientation:  Full (Time, Place, and Person)  Thought Content:  WDL  Suicidal Thoughts:  Yes.  with intent/plan  Homicidal Thoughts:  No  Memory:  Immediate;   Fair Recent;   Fair Remote;   Fair  Judgement:  Fair  Insight:  Fair  Psychomotor Activity:  Normal  Concentration:  Fair  Recall:  [employer]  Fund of Knowledge:Fair  Language: Fair  Akathisia:  No  Handed:  Right  AIMS (if indicated):     Assets:  Communication Skills Desire for Improvement Financial Resources/Insurance Housing Physical Health Resilience Social Support  ADL's:  Intact  Cognition: WNL  Sleep:  Number of Hours: 6    Treatment Plan Summary: Daily contact with patient to assess and evaluate symptoms and progress in treatment and Medication management   Dillon [name] is a 65 year old male with a history of anxiety and depression admitted for worsening of his symptoms, severe weight loss, and insomnia in the context of recent major loss.  1. Suicidal ideation. The patient is able to contract for safety in the hospital.   2. Mood and anxiety. We increase Luvox to 150 mg for depression and OCD type of symptoms.   3. Insomnia. Trazodone  was decreased to 50 mg and he is tolerating it well.  4. BPH. We continue Flomax.  5. Failure to thrive. We started Megace and Ensure. He complains of an early satiety. We'll check weight on Monday.  6. Disposition. The patient will be discharged home. He will follow-up with [facility].  Brandy Hale, MD 12/20/2015, 3:35 PM

## 2015-12-21 MED ORDER — TAMSULOSIN HCL 0.4 MG PO CAPS: 0.4000 mg | ORAL_CAPSULE | Freq: Two times a day (BID) | ORAL | Status: DC

## 2015-12-21 MED ORDER — FLUVOXAMINE MALEATE 100 MG PO TABS: 200.0000 mg | ORAL_TABLET | Freq: Every day | ORAL | Status: DC

## 2015-12-21 MED ORDER — FLUVOXAMINE MALEATE 50 MG PO TABS: 200.0000 mg | ORAL_TABLET | Freq: Every day | ORAL | Status: DC

## 2015-12-21 MED ORDER — TRAZODONE HCL 50 MG PO TABS: 50.0000 mg | ORAL_TABLET | Freq: Every day | ORAL | Status: DC

## 2015-12-21 NOTE — [facility] Group Notes (Signed)
[facility] LCSW Group Therapy   12/21/2015 1 pm Type of Therapy: Group Therapy   Participation Level: Active   Participation Quality: Attentive, Sharing and Supportive   Affect: Depressed and Flat   Cognitive: Alert and Oriented   Insight: Developing/Improving and Engaged   Engagement in Therapy: Developing/Improving and Engaged   Modes of Intervention: Clarification, Confrontation, Discussion, Education, Exploration,  Limit-setting, Orientation, Problem-solving, Rapport Building, Dance movement psychotherapisteality Testing, Socialization and Support   Summary of Progress/Problems: Pt identified obstacles faced currently and processed barriers involved in overcoming these obstacles. Pt identified steps necessary for overcoming these obstacles and explored motivation (internal and external) for facing these difficulties head on. Pt further identified one area of concern in their lives and chose a goal to focus on for today. Pt shared during group when prompted by the clinician about his plans to use self-care to overcome his obstacles. Pt listed his obstacles are his hospitalization due to grief, the loss of his wife and his inability to cope with his grief.  Pt did actively listened to and showed interest in the sharing of others, as evidenced by verbal affirmations during group sharing.  Pt shared that self-care was something he does not practice enough and shared that he looked forward to minimizing his obstacles by attending group counseling and sharing with others his pain.  Pt shared he is focused on his goal of getting better and dealing with grief in a more effective manner.  Dorothe PeaJonathan F. Angus Amini, LCSWA, LCAS  /17

## 2015-12-21 NOTE — Progress Notes (Signed)
D: Pt denies SI/HI/AVH. Pt is pleasant and cooperative. Patient's affect is flat and sad, pt is isolative, not interacting with peers, minimal interaction with staff, he appears less anxious, and guarded with information.   A: Pt was offered support and encouragement. Pt was given scheduled medications. Pt was encouraged to attend groups. Q 15 minute checks were done for safety.  R:Pt did not attend evening group.  Pt is complaint with medication. Pt has no complaints.Pt receptive to treatment and safety maintained on unit.

## 2015-12-21 NOTE — [facility] Group Notes (Signed)
[facility] Group Notes:  (Nursing/MHT/Case Management/Adjunct)  Date:  12/21/2015  Time:  1:57 AM  Type of Therapy:  Group Therapy  Participation Level:  Active  Participation Quality:  Attentive  Affect:  Flat  Cognitive:  Appropriate  Insight:  Improving and Limited  Engagement in Group:  Developing/Improving  Modes of Intervention:  n/a  Summary of Progress/Problems:  Dillon [name] 12/21/2015, 1:57 AM

## 2015-12-21 NOTE — [facility] Suicide Risk Assessment (Signed)
[facility]   Principal Problem: Severe recurrent major depression without psychotic features [facility](HCC) Discharge Diagnoses:  Patient Active Problem List   Diagnosis Date Noted  . Severe recurrent major depression without psychotic features (HCC) [F33.2] 12/15/2015  . Grief [F43.21] 12/15/2015  . Weight loss [R63.4] 12/15/2015  . OCD (obsessive compulsive disorder) [F42.9] 12/15/2015  . Benign prostatic hyperplasia with urinary obstruction [N40.1] 11/24/2015  . 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   Blood pressure 147/92, pulse 71, temperature 97.8 F (36.6 C), temperature source Oral, resp. rate 18, height 5\' 9"  (1.753 m), weight 74.844 kg (165 lb), SpO2 99 %.Body mass index is 24.36 kg/(m^2).  General Appearance: Casual  Eye Contact::  Good  Speech:  Clear and Coherent409  Volume:  Normal  Mood:  Anxious  Affect:  Appropriate  Thought Process:  Goal Directed  Orientation:  Full (Time, Place, and Person)  Thought Content:  WDL  Suicidal Thoughts:  No  Homicidal Thoughts:  No  Memory:  Immediate;   Fair Recent;   Fair Remote;   Fair  Judgement:  Fair  Insight:  Fair  Psychomotor Activity:  Normal  Concentration:  Fair  Recall:  [employer]Fair  Fund of Knowledge:Fair  Language: Fair  Akathisia:  No  Handed:  Right  AIMS (if indicated):     Assets:  Communication Skills Desire for Improvement Financial Resources/Insurance Housing Physical Health Resilience Social Support  Sleep:  Number of Hours: 8  Cognition: WNL  ADL's:  Intact   Mental Status Per Nursing Assessment::   On Admission:  NA (Denies)  Demographic Factors:  Male, Divorced or widowed, Caucasian and Living alone  Loss  Factors: Loss of significant relationship and Financial problems/change in socioeconomic status  Historical Factors: NA  Risk Reduction Factors:   Sense of responsibility to family and Positive social support  Continued Clinical Symptoms:  Depression:   Severe Obsessive-Compulsive Disorder  Cognitive Features That Contribute To Risk:  None    Suicide Risk:  Minimal: No identifiable suicidal ideation.  Patients presenting with no risk factors but with morbid ruminations; may be classified as minimal risk based on the severity of the depressive symptoms  Follow-up Information    Follow up with [facility].   Why:  Please arrive to the walk-in clinic between the hours of 8am-2:30pm for an assessment for medication management and therapy.  Arrive as early as possible for prompt service.  Please call Unk PintoHarvey Bryant at [phone] for questions and assistance.   Contact information:   [address] [place] [address] Ph: [phone] Fax: [phone]      Follow up with [facility].   Why:  Please arrive for your hospital follow up appointment with Gabriel Cirriheryl Wicker Friday March 10th at 2:30pm   Contact information:   [address] [place], [place] [address] Phone: [phone] Fax: [phone]                     [facility]   Why:  Please arrive to the center between the hours of 8am-5pm sign up for activities, day trips and social events.  You will have a flyer mailed to you that illustrates what is offered  Contact information:   [address] [place], [place] [address] Phone: [phone]       Plan Of Care/Follow-up recommendations:  Activity:  As tolerated. Diet:  Low sodium heart healthy. Other:  Keep follow-up appointments.  Kristine Linea, MD 12/21/2015, 11:13 AM

## 2015-12-21 NOTE — [facility] Group Notes (Signed)
[facility] Dis[facility] Planning Group Note   12/21/2015 9:15 AM  ?  Participation Quality: Alert, Appropriate and Oriented   Mood/Affect: Depressed and Flat   Depression Rating: 2  Anxiety Rating: 2  Thoughts of Suicide: Pt denies SI/HI   Will you contract for safety? Yes   Current AVH: Pt denies   Plan for Discharge/Comments: Pt attended discharge planning group and actively participated in group. CSW provided pt with today's workbook. Pt shared with the group he looks forward to spending time with family and with his extended family and that he enjoys the support of all during his current crisis.  Pt shared he intends to focus more on self-care, as well as his family relationships upon discharge.  Transportation Means: Pt reports access to transportation   Supports: Pt lists his supports as his son, daughter and grandchildren    Dorothe PeaJonathan F. Emmamarie Kluender, MSW, LCSWA, LCAS

## 2015-12-21 NOTE — Progress Notes (Signed)
Patient denies SI/HI, denies A/V hallucinations. Patient verbalizes understanding of discharge instructions, follow up care and prescriptions. Patient given all belongings from  locker. Patient escorted out by staff, transported by family. 

## 2015-12-21 NOTE — [facility] Group Notes (Signed)
[facility] Group Notes:  (Nursing/MHT/Case Management/Adjunct)  Date:  12/21/2015  Time:  12:30 PM  Type of Therapy:  Group Therapy  Participation Level:  Minimal  Participation Quality:  Attentive  Affect:  Resistant  Cognitive:  Appropriate  Insight:  Limited  Engagement in Group:  Supportive  Modes of Intervention:  Support  Summary of Progress/Problems:  Dillon [name] 12/21/2015, 12:30 PM

## 2015-12-21 NOTE — Plan of Care (Signed)
Problem: Alteration in mood; excessive anxiety as evidenced by: Goal: LTG-Patient's behavior demonstrates decreased anxiety (Patient's behavior demonstrates anxiety and he/she is utilizing learned coping skills to deal with anxiety-producing situations)  Outcome: Progressing Patient denies anxiety and appears less anxious.

## 2015-12-21 NOTE — Discharge Summary (Signed)
Physician Discharge Summary Note  Patient:  Dillon PriestRobert H [name] is an 65 y.o., male MRN:  [id number] DOB:  [birth year] Patient phone:  [phone] (home)  Patient address:   [address] [place] [address],  Total Time spent with patient: 30 minutes  Date of Admission:  12/15/2015 Date of Discharge: 12/21/2015  Reason for Admission:  Suicidal ideation.  Identifying data. Dillon [name] is a 65 year old male with a history of depression and anxiety.  Chief complaint. "I don't think I can handle it any longer."  History of present illness. Information was obtained from the patient and the chart. The patient has a long history of anxiety with OCD type of symptoms. He receives treatment for his symptoms periodically from his primary care provider. He became severely depressed and anxious after his wife passed away in April 2016. He lost 35 pounds since. He reports poor sleep, decreased appetite, anhedonia, hopelessness worthlessness, poor energy and lack of interest, social isolation crying spells, worsening of his anxiety including OCD symptoms and suicidal ideation. The patient has been trying to deal with it the best he could but has been struggling recently. He go grief counseling and it was somewhat helpful. He has been getting Remeron and Ativan from his primary care provider with some relief. He did not see psychiatrist in spite of PCP recommendation. His OCD symptoms include excessive worries checking and cleaning. He sometimes has thoughts of not wanting to press on. He denies psychotic symptoms. He denies symptoms suggestive of bipolar disorder. He does not use drugs or alcohol.  Past psychiatric history. He has never been hospitalized. There were no suicide attempts. He has diagnoses of OCD. He does not remember the names of medications that have been tried.  Family psychiatric history. Father with mental illness mostly anxiety and depression.  Social history. He retired in March of last year.  His wife passed away in April. He lives by himself. He has 2 adult children and grandchildren nearby but the patient has very little interest in interacting with his family even though he loves them dearly. He experiences some financial difficulties. He is poorly mostly because he does not cook to save the money on Technical brewerelectrical bill. He has to my private insurance. It causes financial strain.  Principal Problem: Severe recurrent major depression without psychotic features [facility](HCC) Discharge Diagnoses: Patient Active Problem List   Diagnosis Date Noted  . Severe recurrent major depression without psychotic features (HCC) [F33.2] 12/15/2015  . Grief [F43.21] 12/15/2015  . Weight loss [R63.4] 12/15/2015  . OCD (obsessive compulsive disorder) [F42.9] 12/15/2015  . Benign prostatic hyperplasia with urinary obstruction [N40.1] 11/24/2015  . 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  Past Medical History:  Past Medical History  Diagnosis Date  . Meningitis     spinal  . OCD (obsessive compulsive disorder)   . Kidney stones     Past Surgical History  Procedure Laterality Date  . Kidney stone surgery     Family History:  Family History  Problem Relation Age of Onset  . Cancer Mother     bladder  . Emphysema Father   . Emphysema Sister   . COPD Sister   . Cancer Brother     prostate   Family Psychiatric  History: Anxiety and depression. Social History:  History  Alcohol Use No     History  Drug Use No    Social History   Social History  . Marital  Status: Widowed    Spouse Name: N/A  . Number of Children: N/A  . Years of Education: N/A   Social History Main Topics  . Smoking status: Never Smoker   . Smokeless tobacco: Never Used  . Alcohol Use: No  . Drug Use: No  . Sexual Activity: Not Asked   Other Topics Concern  . None   Social History Narrative    Hospital Course:    [name] is a 65 year old  male with a history of anxiety and depression admitted for worsening of his symptoms, severe weight loss, and insomnia in the context of recent major loss.  1. Suicidal ideation. This has resolved. The patient is able to contract for safety. He is forward thinking and more optimistic about the future.   2. Mood and anxiety. We discontinued Remeron and started Luvox for depression and OCD type of symptoms.   3. Insomnia. He responded well to trazodone.   4. BPH. We continued Flomax.  5. Failure to thrive. We offered Megace and Ensure. He complains of an early satiety.  6. Disposition. The patient was discharged home. He will [facility]-up with [facility]. Will [facility] activities participation and grief counseling.   Physical Findings: AIMS: Facial and Oral Movements Muscles of Facial Expression: None, normal Lips and Perioral Area: None, normal Jaw: None, normal Tongue: None, normal,Extremity Movements Upper (arms, wrists, hands, fingers): None, normal Lower (legs, knees, ankles, toes): None, normal, Trunk Movements Neck, shoulders, hips: None, normal, Overall Severity Severity of abnormal movements (highest score from questions above): None, normal Incapacitation due to abnormal movements: None, normal Patient's awareness of abnormal movements (rate only patient's report): No Awareness, Dental Status Current problems with teeth and/or dentures?: No Does patient usually wear dentures?: No  CIWA:  CIWA-Ar Total: 0 COWS:  COWS Total Score: 2  Musculoskeletal: Strength & Muscle Tone: within normal limits Gait & Station: normal Patient leans: N/A  Psychiatric Specialty Exam: Review of Systems  All other systems reviewed and are negative.   Blood pressure 147/92, pulse 71, temperature 97.8 F (36.6 C), temperature source Oral, resp. rate 18, height  (1.753 m), weight 74.844 kg (165 lb), SpO2 99 %.Body mass index is 24.36 kg/(m^2).  See SRA.                                                   Sleep:  Number of Hours: 8   Have you used any form of tobacco in the last 30 days? (Cigarettes, Smokeless Tobacco, Cigars, and/or Pipes): No  Has this patient used any form of tobacco in the last 30 days? (Cigarettes, Smokeless Tobacco, Cigars, and/or Pipes) Yes, No  Blood Alcohol level:  Lab Results  Component Value Date   ETH <5 12/15/2015    Metabolic Disorder Labs:  No results found for: HGBA1C, MPG No results found for: PROLACTIN No results found for: CHOL, TRIG, HDL, CHOLHDL, VLDL, LDLCALC  See Psychiatric Specialty Exam and Suicide Risk Assessment completed by Attending Physician prior to discharge.  Discharge destination:  Home  Is patient on multiple antipsychotic therapies at discharge:  No   Has Patient had three or more failed trials of antipsychotic monotherapy by history:  No  Recommended Plan for Multiple Antipsychotic Therapies: NA  Discharge Instructions    Diet - low sodium heart healthy    Complete by:  As directed      Increase activity slowly    Complete by:  As directed             Medication List    STOP taking these medications        LORazepam 0.5 MG tablet  Commonly known as:  ATIVAN     LORazepam 1 MG tablet  Commonly known as:  ATIVAN     mirtazapine 15 MG tablet  Commonly known as:  REMERON      TAKE these medications      Indication   fluvoxaMINE 100 MG tablet  Commonly known as:  LUVOX  Take 2 tablets (200 mg total) by mouth at bedtime.   Indication:  Obsessive Compulsive Disorder     tamsulosin 0.4 MG Caps capsule  Commonly known as:  FLOMAX  Take 1 capsule (0.4 mg total) by mouth 2 (two) times daily.   Indication:  Enlarged Prostate     traZODone 50 MG tablet  Commonly known as:  DESYREL  Take 1 tablet (50 mg total) by mouth at bedtime.   Indication:  Trouble Sleeping           Follow-up Information    Follow up with [facility].   Why:  Please arrive to the  walk-in clinic between the hours of 8am-2:30pm for an assessment for medication management and therapy.  Arrive as early as possible for prompt service.  Please call Unk Pinto at [phone] for questions and assistance.   Contact information:   [address] [place] [address] Ph: [phone] Fax: [phone]      Follow up with [facility].   Why:  Please arrive for your hospital follow up appointment with Gabriel Cirri Friday March 10th at 2:30pm   Contact information:   [address] [place], [place] [address] Phone: [phone] Fax: [phone]                     Follow up with [facility].   Why:  Please arrive to the center between the hours of 8am-5pm sign up for activities, day trips and social events.  You will have a flyer mailed to you that illustrates what is offered   Contact information:   [address] [place], [place] [address] Phone: [phone]       Follow-up recommendations:  Activity:  As tolerated. Diet:  Low sodium heart healthy. Other:  Keep follow-up appointments.  Comments:    Signed: Kristine Linea, MD 12/21/2015, 11:16 AM

## 2015-12-22 NOTE — Progress Notes (Signed)
Recreation Therapy Notes  INPATIENT RECREATION TR PLAN  Patient Details Name: LUISMANUEL CORMAN MRN: [id number] DOB: 29-Mar-1951 Today's Date: 12/22/2015  Rec Therapy Plan Is patient appropriate for Therapeutic Recreation?: Yes Treatment times per week: At least once a week TR Treatment/Interventions: 1:1 session, Group participation (Comment) (Appropriate participation in daily recreation therapy tx)  Discharge Criteria Pt will be discharged from therapy if:: Treatment goals are met, Discharged Treatment plan/goals/alternatives discussed and agreed upon by:: Patient/family  Discharge Summary Short term goals set: See Care Plan Short term goals met: Complete Progress toward goals comments: One-to-one attended Which groups?: Leisure education, Social skills One-to-one attended: Self-esteem, stress management Reason goals not met: N/A Therapeutic equipment acquired: None Reason patient discharged from therapy: Discharge from hospital Pt/family agrees with progress & goals achieved: Yes Date patient discharged from therapy: 12/22/15   Leonette Monarch, LRT/CTRS 12/22/2015, 11:29 AM

## 2015-12-25 ENCOUNTER — Ambulatory Visit (INDEPENDENT_AMBULATORY_CARE_PROVIDER_SITE_OTHER): Payer: BLUE CROSS/BLUE SHIELD | Admitting: Unknown Physician Specialty

## 2015-12-25 ENCOUNTER — Encounter: Payer: Self-pay | Admitting: Unknown Physician Specialty

## 2015-12-25 VITALS — BP 139/94 | HR 114 | Temp 97.4°F | Ht 67.5 in | Wt 166.0 lb

## 2015-12-25 DIAGNOSIS — F332 Major depressive disorder, recurrent severe without psychotic features: Secondary | ICD-10-CM

## 2015-12-25 NOTE — Progress Notes (Signed)
BP 139/94 mmHg  Pulse 114  Temp(Src) 97.4 F (36.3 C)  Ht 5' 7.5" (1.715 m)  Wt 166 lb (75.297 kg)  BMI 25.60 kg/m2  SpO2 96%   Subjective:    Patient ID: Dillon [name], male    DOB: November 17, 1950, 65 y.o.   MRN: [id number]  HPI: Dillon [name] is a 65 y.o. male  Chief Complaint  Patient presents with  . Hospitalization Follow-up    He was admitted for severe depression. They prescribed him Trazadone and Luvox, but he said they were too strong for him, so he stopped them,   Pt was in the hospital to help manage his depression.  Pt stopped taking his medications because it is "too strong."  Pt is planning on going to [facility] for care but his counselor was sick and states he is to go over on Monday.  He was taking Luvox of 200 mg and Trazadone QHS.  Though upon review it is unclear how he was taking his medication or if he is willing to take his medication.  He has no suicidal ideation at this time.     Relevant past medical, surgical, family and social history reviewed and updated as indicated. Interim medical history since our last visit reviewed. Allergies and medications reviewed and updated.  Review of Systems  Per HPI unless specifically indicated above     Objective:    BP 139/94 mmHg  Pulse 114  Temp(Src) 97.4 F (36.3 C)  Ht 5' 7.5" (1.715 m)  Wt 166 lb (75.297 kg)  BMI 25.60 kg/m2  SpO2 96%  Wt Readings from Last 3 Encounters:  12/25/15 166 lb (75.297 kg)  12/15/15 165 lb (74.844 kg)  12/15/15 180 lb (81.647 kg)    Physical Exam  Constitutional: He is oriented to person, place, and time. He appears well-developed and well-nourished. No distress.  Poor hygeine noted  HENT:  Head: Normocephalic and atraumatic.  Eyes: Conjunctivae and lids are normal. Right eye exhibits no discharge. Left eye exhibits no discharge. No scleral icterus.  Cardiovascular: Normal rate.   Pulmonary/Chest: Effort normal.  Abdominal: Normal appearance. There is no splenomegaly or  hepatomegaly.  Musculoskeletal: Normal range of motion.  Neurological: He is alert and oriented to person, place, and time.  Skin: Skin is intact. No rash noted. No pallor.  Psychiatric: He has a normal mood and affect. His behavior is normal. Judgment and thought content normal.    Results for orders placed or performed during the hospital encounter of 12/15/15  Comprehensive metabolic panel  Result Value Ref Range   Sodium 141 135 - 145 mmol/L   Potassium 4.9 3.5 - 5.1 mmol/L   Chloride 104 101 - 111 mmol/L   CO2 24 22 - 32 mmol/L   Glucose, Bld 111 (H) 65 - 99 mg/dL   BUN 24 (H) 6 - 20 mg/dL   Creatinine, Ser 4.091.56 (H) 0.61 - 1.24 mg/dL   Calcium 81.110.9 (H) 8.9 - 10.3 mg/dL   Total Protein 7.6 6.5 - 8.1 g/dL   Albumin 5.0 3.5 - 5.0 g/dL   AST 23 15 - 41 U/L   ALT 33 17 - 63 U/L   Alkaline Phosphatase 109 38 - 126 U/L   Total Bilirubin 2.1 (H) 0.3 - 1.2 mg/dL   GFR calc non Af Amer 46 (L) >60 mL/min   GFR calc Af Amer 53 (L) >60 mL/min   Anion gap 13 5 - 15  Ethanol (ETOH)  Result Value Ref  Range   Alcohol, Ethyl (B) <5 <5 mg/dL  Salicylate level  Result Value Ref Range   Salicylate Lvl <4.0 2.8 - 30.0 mg/dL  Acetaminophen level  Result Value Ref Range   Acetaminophen (Tylenol), Serum <10 (L) 10 - 30 ug/mL  CBC  Result Value Ref Range   WBC 10.2 3.8 - 10.6 K/uL   RBC 6.09 (H) 4.40 - 5.90 MIL/uL   Hemoglobin 19.0 (H) 13.0 - 18.0 g/dL   HCT 16.1 (H) 09.6 - 04.5 %   MCV 87.6 80.0 - 100.0 fL   MCH 31.2 26.0 - 34.0 pg   MCHC 35.7 32.0 - 36.0 g/dL   RDW 40.9 81.1 - 91.4 %   Platelets 195 150 - 440 K/uL  Urine Drug Screen, Qualitative ([facility] only)  Result Value Ref Range   Tricyclic, Ur Screen NONE DETECTED NONE DETECTED   Amphetamines, Ur Screen NONE DETECTED NONE DETECTED   MDMA (Ecstasy)Ur Screen NONE DETECTED NONE DETECTED   Cocaine Metabolite,Ur No Name NONE DETECTED NONE DETECTED   Opiate, Ur Screen NONE DETECTED NONE DETECTED   Phencyclidine (PCP) Ur S NONE DETECTED  NONE DETECTED   Cannabinoid 50 Ng, Ur [place] NONE DETECTED NONE DETECTED   Barbiturates, Ur Screen NONE DETECTED NONE DETECTED   Benzodiazepine, Ur Scrn NONE DETECTED NONE DETECTED   Methadone Scn, Ur NONE DETECTED NONE DETECTED      Assessment & Plan:   Problem List Items Addressed This Visit      Unprioritized   Severe recurrent major depression without psychotic features (HCC) - Primary    Called his daughter to discuss patient.  She states there are a lot of family resources and much of the family will be available and checking on him.  Encouraged pt to take 100 mg Luvox instead of  and 1/2 of Trazadone.  Take Trazadone at 8p.  Written instructions given.  Discussed with daughter to bring to the ER if a danger to himself.          Number and address again given for [facility].  Pt will be driven to [facility] on Monday  Follow up plan: Return for f/u at [facility].

## 2015-12-25 NOTE — Assessment & Plan Note (Addendum)
Called his daughter to discuss patient.  She states there are a lot of family resources and much of the family will be available and checking on him.  Encouraged pt to take 100 mg Luvox instead of 200mg  and 1/2 of Trazadone.  Take Trazadone at 8p.  Written instructions given.  Discussed with daughter to bring to the ER if a danger to himself.

## 2015-12-25 NOTE — Patient Instructions (Addendum)
Follow up with [facility].    Why: Please arrive to the walk-in clinic between the hours of 8am-2:30pm for an assessment for medication management and therapy. Arrive as early as possible for prompt service. Please call Unk PintoHarvey Bryant at [phone] for questions and assistance.   Contact information:   [address] [place] [address] Ph: [phone] Fax: [phone]       Take 100 mg of Luvox (1 pill rather than 2) and 1/2 of Trazadone.  50 mg for 25 mg.  Take Trazadone at 8p at night and Luvox in the AM

## 2016-01-04 ENCOUNTER — Emergency Department
Admission: EM | Admit: 2016-01-04 | Discharge: 2016-01-04 | Disposition: A | Discharge status: Other Acute Inpatient Hospital | Payer: BLUE CROSS/BLUE SHIELD | Attending: Emergency Medicine | Admitting: Emergency Medicine

## 2016-01-04 ENCOUNTER — Encounter: Payer: Self-pay | Admitting: Psychiatry

## 2016-01-04 ENCOUNTER — Inpatient Hospital Stay
Admission: EM | Admit: 2016-01-04 | Discharge: 2016-01-29 | DRG: 885 | Disposition: A | Discharge status: Home / Self Care | Payer: BLUE CROSS/BLUE SHIELD | Source: Intra-hospital | Attending: Psychiatry | Admitting: Psychiatry

## 2016-01-04 ENCOUNTER — Encounter: Payer: Self-pay | Admitting: Emergency Medicine

## 2016-01-04 ENCOUNTER — Ambulatory Visit: Payer: No Typology Code available for payment source | Admitting: Gastroenterology

## 2016-01-04 DIAGNOSIS — F332 Major depressive disorder, recurrent severe without psychotic features: Secondary | ICD-10-CM

## 2016-01-04 DIAGNOSIS — Z9889 Other specified postprocedural states: Secondary | ICD-10-CM

## 2016-01-04 DIAGNOSIS — Z6821 Body mass index (BMI) 21.0-21.9, adult: Secondary | ICD-10-CM | POA: Diagnosis not present

## 2016-01-04 DIAGNOSIS — F32A Depression, unspecified: Secondary | ICD-10-CM

## 2016-01-04 DIAGNOSIS — Z8042 Family history of malignant neoplasm of prostate: Secondary | ICD-10-CM | POA: Diagnosis not present

## 2016-01-04 DIAGNOSIS — R45851 Suicidal ideations: Secondary | ICD-10-CM

## 2016-01-04 DIAGNOSIS — N401 Enlarged prostate with lower urinary tract symptoms: Secondary | ICD-10-CM | POA: Diagnosis present

## 2016-01-04 DIAGNOSIS — Z79899 Other long term (current) drug therapy: Secondary | ICD-10-CM | POA: Insufficient documentation

## 2016-01-04 DIAGNOSIS — F429 Obsessive-compulsive disorder, unspecified: Secondary | ICD-10-CM | POA: Diagnosis present

## 2016-01-04 DIAGNOSIS — F4321 Adjustment disorder with depressed mood: Secondary | ICD-10-CM | POA: Diagnosis present

## 2016-01-04 DIAGNOSIS — R634 Abnormal weight loss: Secondary | ICD-10-CM | POA: Diagnosis present

## 2016-01-04 DIAGNOSIS — F325 Major depressive disorder, single episode, in full remission: Secondary | ICD-10-CM | POA: Diagnosis present

## 2016-01-04 DIAGNOSIS — Z8661 Personal history of infections of the central nervous system: Secondary | ICD-10-CM | POA: Diagnosis not present

## 2016-01-04 DIAGNOSIS — F329 Major depressive disorder, single episode, unspecified: Secondary | ICD-10-CM | POA: Diagnosis not present

## 2016-01-04 DIAGNOSIS — R200 Anesthesia of skin: Secondary | ICD-10-CM

## 2016-01-04 DIAGNOSIS — N138 Other obstructive and reflux uropathy: Secondary | ICD-10-CM | POA: Diagnosis present

## 2016-01-04 DIAGNOSIS — R630 Anorexia: Secondary | ICD-10-CM | POA: Diagnosis not present

## 2016-01-04 DIAGNOSIS — R109 Unspecified abdominal pain: Secondary | ICD-10-CM

## 2016-01-04 DIAGNOSIS — G3184 Mild cognitive impairment, so stated: Secondary | ICD-10-CM | POA: Diagnosis present

## 2016-01-04 DIAGNOSIS — Z886 Allergy status to analgesic agent status: Secondary | ICD-10-CM | POA: Diagnosis not present

## 2016-01-04 DIAGNOSIS — Z8052 Family history of malignant neoplasm of bladder: Secondary | ICD-10-CM | POA: Diagnosis not present

## 2016-01-04 DIAGNOSIS — E43 Unspecified severe protein-calorie malnutrition: Secondary | ICD-10-CM | POA: Diagnosis present

## 2016-01-04 DIAGNOSIS — Z825 Family history of asthma and other chronic lower respiratory diseases: Secondary | ICD-10-CM | POA: Diagnosis not present

## 2016-01-04 DIAGNOSIS — Z87442 Personal history of urinary calculi: Secondary | ICD-10-CM | POA: Diagnosis not present

## 2016-01-04 DIAGNOSIS — T1491 Suicide attempt: Secondary | ICD-10-CM | POA: Diagnosis present

## 2016-01-04 DIAGNOSIS — K5900 Constipation, unspecified: Secondary | ICD-10-CM | POA: Diagnosis present

## 2016-01-04 DIAGNOSIS — W19XXXA Unspecified fall, initial encounter: Secondary | ICD-10-CM

## 2016-01-04 LAB — COMPREHENSIVE METABOLIC PANEL
ALBUMIN: 4.5 g/dL (ref 3.5–5.0)
ALT: 44 U/L (ref 17–63)
AST: 38 U/L (ref 15–41)
Alkaline Phosphatase: 78 U/L (ref 38–126)
Anion gap: 9 (ref 5–15)
BUN: 25 mg/dL — AB (ref 6–20)
CHLORIDE: 107 mmol/L (ref 101–111)
CO2: 20 mmol/L — AB (ref 22–32)
CREATININE: 1.06 mg/dL (ref 0.61–1.24)
Calcium: 10.2 mg/dL (ref 8.9–10.3)
GFR calc non Af Amer: >60 mL/min (ref >60)
Glucose, Bld: 92 mg/dL (ref 65–99)
Potassium: 3.6 mmol/L (ref 3.5–5.1)
SODIUM: 136 mmol/L (ref 135–145)
Total Bilirubin: 2 mg/dL — ABNORMAL HIGH (ref 0.3–1.2)
Total Protein: 6.9 g/dL (ref 6.5–8.1)

## 2016-01-04 LAB — ACETAMINOPHEN LEVEL: Acetaminophen (Tylenol), Serum: <10 ug/mL — ABNORMAL LOW (ref 10–30)

## 2016-01-04 LAB — CBC
HCT: 53.4 % — ABNORMAL HIGH (ref 40.0–52.0)
HEMOGLOBIN: 18.6 g/dL — AB (ref 13.0–18.0)
MCH: 30.9 pg (ref 26.0–34.0)
MCHC: 34.8 g/dL (ref 32.0–36.0)
MCV: 88.7 fL (ref 80.0–100.0)
Platelets: 192 10*3/uL (ref 150–440)
RBC: 6.02 MIL/uL — AB (ref 4.40–5.90)
RDW: 13.1 % (ref 11.5–14.5)
WBC: 11.2 10*3/uL — ABNORMAL HIGH (ref 3.8–10.6)

## 2016-01-04 LAB — URINE DRUG SCREEN, QUALITATIVE (ARMC ONLY)
Amphetamines, Ur Screen: NOT DETECTED
Barbiturates, Ur Screen: NOT DETECTED
Benzodiazepine, Ur Scrn: NOT DETECTED
CANNABINOID 50 NG, UR ~~LOC~~: NOT DETECTED
COCAINE METABOLITE, UR ~~LOC~~: NOT DETECTED
MDMA (ECSTASY) UR SCREEN: NOT DETECTED
Methadone Scn, Ur: NOT DETECTED
Opiate, Ur Screen: NOT DETECTED
Phencyclidine (PCP) Ur S: NOT DETECTED
TRICYCLIC, UR SCREEN: NOT DETECTED

## 2016-01-04 LAB — SALICYLATE LEVEL

## 2016-01-04 LAB — ETHANOL: Alcohol, Ethyl (B): <5 mg/dL (ref <5)

## 2016-01-04 MED ORDER — MAGNESIUM HYDROXIDE 400 MG/5ML PO SUSP
30.0000 mL | Freq: Every day | ORAL | Status: DC | PRN
  Administered 2016-01-07: 30 mL via ORAL

## 2016-01-04 MED ORDER — ENSURE ENLIVE PO LIQD
237.0000 mL | Freq: Three times a day (TID) | ORAL | Status: DC
  Administered 2016-01-05 – 2016-01-06 (×4): 237 mL via ORAL
  Administered 2016-01-07 (×2): 50 mL via ORAL
  Administered 2016-01-07 – 2016-01-29 (×52): 237 mL via ORAL

## 2016-01-04 MED ORDER — RISPERIDONE 1 MG PO TABS
1.0000 mg | ORAL_TABLET | Freq: Every day | ORAL | Status: DC
Start: 2016-01-04 — End: 2016-01-05
  Administered 2016-01-04: 1 mg via ORAL
  Filled 2016-01-04: qty 1

## 2016-01-04 MED ORDER — TAMSULOSIN HCL 0.4 MG PO CAPS
0.4000 mg | ORAL_CAPSULE | Freq: Every day | ORAL | Status: DC
  Administered 2016-01-05 – 2016-01-28 (×24): 0.4 mg via ORAL
  Filled 2016-01-04 (×24): qty 1

## 2016-01-04 MED ORDER — TRAZODONE HCL 50 MG PO TABS: 50.0000 mg | ORAL_TABLET | Freq: Every evening | ORAL | Status: DC | PRN

## 2016-01-04 MED ORDER — FLUVOXAMINE MALEATE 50 MG PO TABS
100.0000 mg | ORAL_TABLET | Freq: Every day | ORAL | Status: DC
  Administered 2016-01-04 – 2016-01-28 (×25): 100 mg via ORAL
  Filled 2016-01-04 (×25): qty 2

## 2016-01-04 MED ORDER — FLUVOXAMINE MALEATE 50 MG PO TABS: 200.0000 mg | ORAL_TABLET | Freq: Every day | ORAL | Status: DC

## 2016-01-04 MED ORDER — ACETAMINOPHEN 325 MG PO TABS
650.0000 mg | ORAL_TABLET | Freq: Four times a day (QID) | ORAL | Status: DC | PRN
  Filled 2016-01-04: qty 2

## 2016-01-04 MED ORDER — TAMSULOSIN HCL 0.4 MG PO CAPS
0.4000 mg | ORAL_CAPSULE | Freq: Every day | ORAL | Status: DC
  Administered 2016-01-04: 0.4 mg via ORAL
  Filled 2016-01-04: qty 1

## 2016-01-04 MED ORDER — FLUVOXAMINE MALEATE 50 MG PO TABS
200.0000 mg | ORAL_TABLET | Freq: Every day | ORAL | Status: DC
Start: 2016-01-04 — End: 2016-01-04
  Filled 2016-01-04: qty 2

## 2016-01-04 MED ORDER — ALUM & MAG HYDROXIDE-SIMETH 200-200-20 MG/5ML PO SUSP: 30.0000 mL | ORAL | Status: DC | PRN

## 2016-01-04 NOTE — ED Provider Notes (Signed)
[facility] Emergency Department Provider Note  Time seen: 6:34 PM  I have reviewed the triage vital signs and the nursing notes.   HISTORY  Chief Complaint Suicide Attempt    HPI Dillon [name] is a 65 y.o. male with a past medical history of OCD, depression, presents the emergency department depression not wanting to eat or drink. According to the patient, his wife passed away nearly one year ago.He states increased depression since then. He is not eating or drinking for the past week. He denies any active SI or HI but states he just doesn't feel like eating or drinking due to the depression. Denies any medical complaints.     Past Medical History  Diagnosis Date  . Meningitis     spinal  . OCD (obsessive compulsive disorder)   . Kidney stones     Patient Active Problem List   Diagnosis Date Noted  . Suicidal ideation 01/04/2016  . Severe recurrent major depression without psychotic features (HCC) 12/15/2015  . Grief 12/15/2015  . Weight loss 12/15/2015  . OCD (obsessive compulsive disorder) 12/15/2015  . Benign prostatic hyperplasia with urinary obstruction 11/24/2015  . Anomalies of urachus, congenital 10/23/2015    Past Surgical History  Procedure Laterality Date  . Kidney stone surgery      Current Outpatient Rx  Name  Route  Sig  Dispense  Refill  . tamsulosin (FLOMAX) 0.4 MG CAPS capsule   Oral   Take 1 capsule (0.4 mg total) by mouth 2 (two) times daily.   30 capsule   0     Allergies Codeine  Family History  Problem Relation Age of Onset  . Cancer Mother     bladder  . Emphysema Father   . Emphysema Sister   . COPD Sister   . Cancer Brother     prostate    Social History Social History  Substance Use Topics  . Smoking status: Never Smoker   . Smokeless tobacco: Never Used  . Alcohol Use: No    Review of Systems Constitutional: Negative for fever. Cardiovascular: Negative for chest pain. Respiratory:  Negative for shortness of breath. Gastrointestinal: Negative for abdominal pain Musculoskeletal: Negative for back pain. Neurological: Negative for headache 10-point ROS otherwise negative.  ____________________________________________   PHYSICAL EXAM:  VITAL SIGNS: ED Triage Vitals  Enc Vitals Group     BP 01/04/16 1653 134/101 mmHg     Pulse Rate 01/04/16 1653 112     Resp 01/04/16 1653 16     Temp 01/04/16 1653 97.5 F (36.4 C)     Temp Source 01/04/16 1653 Oral     SpO2 01/04/16 1653 98 %     Weight 01/04/16 1653 165 lb (74.844 kg)     Height 01/04/16 1653 5\' 9"  (1.753 m)     Head Cir --      Peak Flow --      Pain Score --      Pain Loc --      Pain Edu? --      Excl. in GC? --     Constitutional: Alert and oriented. Soft-spoken, rarely makes eye contact. Eyes: Normal exam ENT   Head: Normocephalic and atraumatic.   Mouth/Throat: Mucous membranes are moist. Cardiovascular: Normal rate, regular rhythm. No murmur Respiratory: Normal respiratory effort without tachypnea nor retractions. Breath sounds are clear Gastrointestinal: Soft and nontender. No distention.  Musculoskeletal: Nontender with normal range of motion in all extremities.  Neurologic:  Normal speech  and language. No gross focal neurologic deficits Skin:  Skin is warm, dry and intact.  Psychiatric: Flat affect, denies active SI or HI.  ____________________________________________    INITIAL IMPRESSION / ASSESSMENT AND PLAN / ED COURSE  Pertinent labs & imaging results that were available during my care of the patient were reviewed by me and considered in my medical decision making (see chart for details).  Patient presents the emergency department for depression. Patient's wife passed away proximal one year ago and he states his depression has been worsening ever since. Patient denies any active SI or HI.  Labs are largely within normal limits, besides signs of dehydration. We will have  psychiatry see the patient.  Psychiatry was seen and evaluated the patient, they believe the patient needs further treatment and will be admitted to their service.  ____________________________________________   FINAL CLINICAL IMPRESSION(S) / ED DIAGNOSES  Depression   Minna Antis, MD 01/04/16 [phone]

## 2016-01-04 NOTE — ED Notes (Signed)
Pt has 384.00 cash that was given to security to lock up.  Pt witnessed count of money.

## 2016-01-04 NOTE — Consult Note (Signed)
[facility] Consult   Reason for Consult:  Consult for this 65 year old man with a history of OCD and depression who presents to the emergency room Referring Physician:  Paduchowski Patient Identification: Dillon [name] MRN:  [id number] Principal Diagnosis: Severe recurrent major depression without psychotic features [facility]) Diagnosis:   Patient Active Problem List   Diagnosis Date Noted  . Suicidal ideation [R45.851] 01/04/2016  . Severe recurrent major depression without psychotic features ([place]) [F33.2] 12/15/2015  . Grief [F43.21] 12/15/2015  . Weight loss [R63.4] 12/15/2015  . OCD (obsessive compulsive disorder) [F42.9] 12/15/2015  . Benign prostatic hyperplasia with urinary obstruction [N40.1] 11/24/2015  . 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 male patient admitted with "I'm not doing well".  HPI:  65 year old man with a history of depression and OCD who was just discharged from the hospital about 2 weeks ago. He returns to the hospital tonight partially at his own wish and partially at the encouragement of his adult children. Patient tells me that since he left the hospital things of gone right back to how they were before possibly even worse. He has not been eating or drinking normally at all. He sleeps very little at night. He says mostly he is just staring at the wall or pacing back and forth. He is obsessively worrying about things all day long. He has not taken any of the medicine he was prescribed since leaving the hospital. Doesn't have any good reason for that. He has not tried to kill himself but has had thoughts about how he would be better off dead. Asked about hallucinations he says he hears a high-pitched noise but not anything he can understand. Has not been drinking alcohol or using drugs. Has not gone to the doctor or done anything to take care of himself  really. Has continued to lose weight.  Social history: Lives by himself since his wife died. Ever since then he has gone straight down hill. His adult children look in on him regularly and try to keep him fed but he is not cooperative.  Medical history: He has an enlarged prostate. Otherwise in pretty good health.  Substance abuse history: Denies any alcohol or drug use  Past Psychiatric History: Patient has had previous psychiatric hospitalization just this year. Diagnosis of severe major depression recurrent. He denies suicide attempts but has had intrusive suicidal ideation in the past. History of OCD symptoms. As noted above he has not been cooperative with outpatient treatment recently.  Risk to Self: Is patient at risk for suicide?: Yes Risk to Others:   Prior Inpatient Therapy:   Prior Outpatient Therapy:    Past Medical History:  Past Medical History  Diagnosis Date  . Meningitis     spinal  . OCD (obsessive compulsive disorder)   . Kidney stones     Past Surgical History  Procedure Laterality Date  . Kidney stone surgery     Family History:  Family History  Problem Relation Age of Onset  . Cancer Mother     bladder  . Emphysema Father   . Emphysema Sister   . COPD Sister   . Cancer Brother     prostate   Family Psychiatric  History: Positive family history for anxiety and depression Social History:  History  Alcohol Use No     History  Drug Use No    Social  History   Social History  . Marital Status: Widowed    Spouse Name: N/A  . Number of Children: N/A  . Years of Education: N/A   Social History Main Topics  . Smoking status: Never Smoker   . Smokeless tobacco: Never Used  . Alcohol Use: No  . Drug Use: No  . Sexual Activity: Not Asked   Other Topics Concern  . None   Social History Narrative   Additional Social History:    Allergies:   Allergies  Allergen Reactions  . Codeine Other (See Comments)    Unsure of reaction    Labs:   Results for orders placed or performed during the hospital encounter of 01/04/16 (from the past 48 hour(s))  Comprehensive metabolic panel     Status: Abnormal   Collection Time: 01/04/16  4:58 PM  Result Value Ref Range   Sodium 136 135 - 145 mmol/L   Potassium 3.6 3.5 - 5.1 mmol/L   Chloride 107 101 - 111 mmol/L   CO2 20 (L) 22 - 32 mmol/L   Glucose, Bld 92 65 - 99 mg/dL   BUN 25 (H) 6 - 20 mg/dL   Creatinine, Ser 1.06 0.61 - 1.24 mg/dL   Calcium 10.2 8.9 - 10.3 mg/dL   Total Protein 6.9 6.5 - 8.1 g/dL   Albumin 4.5 3.5 - 5.0 g/dL   AST 38 15 - 41 U/L   ALT 44 17 - 63 U/L   Alkaline Phosphatase 78 38 - 126 U/L   Total Bilirubin 2.0 (H) 0.3 - 1.2 mg/dL   GFR calc non Af Amer >60 >60 mL/min   GFR calc Af Amer >60 >60 mL/min    Comment: (NOTE) The eGFR has been calculated using the CKD EPI equation. This calculation has not been validated in all clinical situations. eGFR's persistently <60 mL/min signify possible Chronic Kidney Disease.    Anion gap 9 5 - 15  Ethanol (ETOH)     Status: None   Collection Time: 01/04/16  4:58 PM  Result Value Ref Range   Alcohol, Ethyl (B) <5 <5 mg/dL    Comment:        LOWEST DETECTABLE LIMIT FOR SERUM ALCOHOL IS 5 mg/dL FOR MEDICAL PURPOSES ONLY   Salicylate level     Status: None   Collection Time: 01/04/16  4:58 PM  Result Value Ref Range   Salicylate Lvl <0.6 2.8 - 30.0 mg/dL  Acetaminophen level     Status: Abnormal   Collection Time: 01/04/16  4:58 PM  Result Value Ref Range   Acetaminophen (Tylenol), Serum <10 (L) 10 - 30 ug/mL    Comment:        THERAPEUTIC CONCENTRATIONS VARY SIGNIFICANTLY. A RANGE OF 10-30 ug/mL MAY BE AN EFFECTIVE CONCENTRATION FOR MANY PATIENTS. HOWEVER, SOME ARE BEST TREATED AT CONCENTRATIONS OUTSIDE THIS RANGE. ACETAMINOPHEN CONCENTRATIONS >150 ug/mL AT 4 HOURS AFTER INGESTION AND >50 ug/mL AT 12 HOURS AFTER INGESTION ARE OFTEN ASSOCIATED WITH TOXIC REACTIONS.   CBC     Status: Abnormal    Collection Time: 01/04/16  4:58 PM  Result Value Ref Range   WBC 11.2 (H) 3.8 - 10.6 K/uL   RBC 6.02 (H) 4.40 - 5.90 MIL/uL   Hemoglobin 18.6 (H) 13.0 - 18.0 g/dL   HCT 53.4 (H) 40.0 - 52.0 %   MCV 88.7 80.0 - 100.0 fL   MCH 30.9 26.0 - 34.0 pg   MCHC 34.8 32.0 - 36.0 g/dL   RDW 13.1 11.5 -  14.5 %   Platelets 192 150 - 440 K/uL  Urine Drug Screen, Qualitative ([facility] only)     Status: None   Collection Time: 01/04/16  4:58 PM  Result Value Ref Range   Tricyclic, Ur Screen NONE DETECTED NONE DETECTED   Amphetamines, Ur Screen NONE DETECTED NONE DETECTED   MDMA (Ecstasy)Ur Screen NONE DETECTED NONE DETECTED   Cocaine Metabolite,Ur [place] NONE DETECTED NONE DETECTED   Opiate, Ur Screen NONE DETECTED NONE DETECTED   Phencyclidine (PCP) Ur S NONE DETECTED NONE DETECTED   Cannabinoid 50 Ng, Ur [place] NONE DETECTED NONE DETECTED   Barbiturates, Ur Screen NONE DETECTED NONE DETECTED   Benzodiazepine, Ur Scrn NONE DETECTED NONE DETECTED   Methadone Scn, Ur NONE DETECTED NONE DETECTED    Comment: (NOTE) 998  Tricyclics, urine               Cutoff 1000 ng/mL 200  Amphetamines, urine             Cutoff 1000 ng/mL 300  MDMA (Ecstasy), urine           Cutoff 500 ng/mL 400  Cocaine Metabolite, urine       Cutoff 300 ng/mL 500  Opiate, urine                   Cutoff 300 ng/mL 600  Phencyclidine (PCP), urine      Cutoff 25 ng/mL 700  Cannabinoid, urine              Cutoff 50 ng/mL 800  Barbiturates, urine             Cutoff 200 ng/mL 900  Benzodiazepine, urine           Cutoff 200 ng/mL 1000 Methadone, urine                Cutoff 300 ng/mL 1100 1200 The urine drug screen provides only a preliminary, unconfirmed 1300 analytical test result and should not be used for non-medical 1400 purposes. Clinical consideration and professional judgment should 1500 be applied to any positive drug screen result due to possible 1600 interfering substances. A more specific alternate chemical method 1700 must be used in  order to obtain a confirmed analytical result.  1800 Gas chromato graphy / mass spectrometry (GC/MS) is the preferred 1900 confirmatory method.     Current Facility-Administered Medications  Medication Dose Route Frequency Provider Last Rate Last Dose  . fluvoxaMINE (LUVOX) tablet 200 mg  200 mg Oral QHS Gonzella Lex, MD      . tamsulosin (FLOMAX) capsule 0.4 mg  0.4 mg Oral QPC supper Gonzella Lex, MD      . traZODone (DESYREL) tablet 50 mg  50 mg Oral QHS PRN Gonzella Lex, MD       Current Outpatient Prescriptions  Medication Sig Dispense Refill  . tamsulosin (FLOMAX) 0.4 MG CAPS capsule Take 1 capsule (0.4 mg total) by mouth 2 (two) times daily. 30 capsule 0    Musculoskeletal: Strength & Muscle Tone: decreased and atrophy Gait & Station: unsteady Patient leans: N/A  Psychiatric Specialty Exam: Review of Systems  Constitutional: Negative.   HENT: Negative.   Eyes: Negative.   Respiratory: Negative.   Cardiovascular: Negative.   Gastrointestinal: Negative.   Musculoskeletal: Negative.   Skin: Negative.   Neurological: Negative.   Psychiatric/Behavioral: Positive for depression and suicidal ideas. Negative for hallucinations, memory loss and substance abuse. The patient is nervous/anxious and has insomnia.  Blood pressure 134/101, pulse 112, temperature 97.5 F (36.4 C), temperature source Oral, resp. rate 16, height 5' 9" (1.753 m), weight 74.844 kg (165 lb), SpO2 98 %.Body mass index is 24.36 kg/(m^2).  General Appearance: Disheveled  Eye Sport and exercise psychologist::  Fair  Speech:  Slow and Slurred  Volume:  Decreased  Mood:  Anxious and Depressed  Affect:  Constricted  Thought Process:  Goal Directed  Orientation:  Full (Time, Place, and Person)  Thought Content:  Obsessions and Rumination  Suicidal Thoughts:  Yes.  without intent/plan  Homicidal Thoughts:  No  Memory:  Immediate;   Good Recent;   Fair Remote;   Fair  Judgement:  Fair  Insight:  Fair  Psychomotor  Activity:  Decreased  Concentration:  Fair  Recall:  [employer] of Knowledge:Fair  Language: Fair  Akathisia:  No  Handed:  Right  AIMS (if indicated):     Assets:  Desire for Improvement Housing Resilience Social Support  ADL's:  Impaired  Cognition: WNL  Sleep:      Treatment Plan Summary: Daily contact with patient to assess and evaluate symptoms and progress in treatment, Medication management and Plan Patient was severe major depression bordering on the psychotic and OCD who has not been taking care of himself and continues to decline physically. He will be admitted to the psychiatry ward. Continue the Luvox that was prescribed last hospital treatment as well as when necessary trazodone. I have already brought up the subject of ECT with this patient. If I were not going to be going on vacation next week I would be strongly lobbying for initiating ECT. If he is still sick when I get back I will try to check up on him. Otherwise continue current medication trial. Patient is agreeable to admission to the psychiatry ward.  Disposition: Recommend psychiatric Inpatient admission when medically cleared. Supportive therapy provided about ongoing stressors.  Alethia Berthold, MD 01/04/2016 6:33 PM

## 2016-01-04 NOTE — ED Notes (Signed)
Pt states he came in by ems from home.  Pt reports that he is depressed.  Pt states he is not eating, sleeping, or taking meds .  Pt was in hospital recently for similar sx.  Pt lives alone.  Pt denies SI or HI.  Pt denies drug or etoh use.   Pt alert and calm.

## 2016-01-04 NOTE — ED Notes (Signed)
Dr clapacs in with pt now.  

## 2016-01-04 NOTE — Progress Notes (Signed)
Pt admitted to unit without incident. Pt denies AVH / SI / HI at this time.  Skin assessment was completed and no contraband was found.  Pt has no tattoos, no skin tears, but does have small abrasion on left shin.  Pt scores pain at a zero out of ten.  Pt belongings were locked up.  Pt was cooperative in answering questions, stated that "I'm here because I worry all the time, and I've lost 50-60 lbs in the last year. I haven't eaten in 4 days, and don't have an apatite."

## 2016-01-04 NOTE — BH Assessment (Signed)
Assessment Note  Dillon [name] is an 65 y.o. male. Dillon [name] reports that he has not been feeling good, and was struggling with depression.  He contacted EMS.  He reports that he has lost a lot of weight recently from not eating,  He reports that he is not sleeping much, he states that he worries all the time. He shared that he has been pretty anxious lately. He denied auditory or visual hallucinations.  He denied suicidal and homicidal ideation or intent.  He denied the use of alcohol or drugs.    Diagnosis: Major Depression  Past Medical History:  Past Medical History  Diagnosis Date  . Meningitis     spinal  . OCD (obsessive compulsive disorder)   . Kidney stones     Past Surgical History  Procedure Laterality Date  . Kidney stone surgery      Family History:  Family History  Problem Relation Age of Onset  . Cancer Mother     bladder  . Emphysema Father   . Emphysema Sister   . COPD Sister   . Cancer Brother     prostate    Social History:  reports that he has never smoked. He has never used smokeless tobacco. He reports that he does not drink alcohol or use illicit drugs.  Additional Social History:  Alcohol / Drug Use History of alcohol / drug use?: No history of alcohol / drug abuse  CIWA: CIWA-Ar BP: (!) 145/92 mmHg Pulse Rate: (!) 108 COWS:    Allergies:  Allergies  Allergen Reactions  . Codeine Other (See Comments)    Unsure of reaction    Home Medications:  Medications Prior to Admission  Medication Sig Dispense Refill  . tamsulosin (FLOMAX) 0.4 MG CAPS capsule Take 1 capsule (0.4 mg total) by mouth 2 (two) times daily. 30 capsule 0    OB/GYN Status:  No LMP for male patient.  General Assessment Data [facility] Assessment: In system Is this a Tele or Face-to-Face Assessment?: Face-to-Face Is this an Initial Assessment or a Re-assessment for this encounter?: Initial Assessment Marital status: [place]owed [place]  n[place]a Is patient pregnant?: No Pregnancy Status: No Living Arrangements: Alone Can pt return to current living arrangement?: Yes Admission Status: Voluntary Is patient capable of signing voluntary admission?: Yes Referral Source: Self/Family/Friend Insurance type: Scientist, research (physical sciences)BCBS  Medical Screening Exam [facility] ONLY) Medical Exam completed: Yes  Crisis Care Plan Living Arrangements: Alone Legal Guardian: Other: (Self) Name of Psychiatrist: Denied Name of Therapist: Denied  Education Status Is patient currently in school?: No Current Grade: n/a Highest grade of school patient has completed: 11th grade Name of school: [employer] Contact person: n/a  Risk to self with the past 6 months Suicidal Ideation: No Has patient been a risk to self within the past 6 months prior to admission? : No Suicidal Intent: No Has patient had any suicidal intent within the past 6 months prior to admission? : No Is patient at risk for suicide?: No Suicidal Plan?: No Has patient had any suicidal plan within the past 6 months prior to admission? : No Access to Means: No What has been your use of drugs/alcohol within the last 12 months?: denied Previous Attempts/Gestures: No How many times?: 0 Other Self Harm Risks: denied Triggers for Past Attempts: None known Intentional Self Injurious Behavior: None Family Suicide History: No Recent stressful life event(s): Loss (Comment) (Wife passed away about a year ago) Persecutory voices/beliefs?: No  Depression: Yes Depression Symptoms: Despondent, Insomnia, Loss of interest in usual pleasures Substance abuse history and/or treatment for substance abuse?: No Suicide prevention information given to non-admitted patients: Not applicable  Risk to Others within the past 6 months Homicidal Ideation: No Does patient have any lifetime risk of violence toward others beyond the six months prior to admission? : No Thoughts of Harm to Others: No Current  Homicidal Intent: No Current Homicidal Plan: No Access to Homicidal Means: No Identified Victim: Denied History of harm to others?: No Assessment of Violence: None Noted Violent Behavior Description: Denied Does patient have access to weapons?: No Criminal Charges Pending?: No Does patient have a court date: No Is patient on probation?: No  Psychosis Hallucinations: None noted Delusions: None noted  Mental Status Report Appearance/Hygiene: In scrubs, Unremarkable Eye Contact: Fair Motor Activity: Unremarkable Speech: Logical/coherent Level of Consciousness: Alert Mood: Depressed Affect: Flat Anxiety Level: None Thought Processes: Coherent Judgement: Unimpaired Orientation: Person, Place, Time, Situation Obsessive Compulsive Thoughts/Behaviors: None  Cognitive Functioning Concentration: Fair Memory: Recent Intact IQ: Average Insight: Fair Impulse Control: Fair Appetite: Poor Sleep: Decreased Vegetative Symptoms: None  ADLScreening [facility] Assessment Services) Patient's cognitive ability adequate to safely complete daily activities?: Yes Patient able to express need for assistance with ADLs?: Yes Independently performs ADLs?: Yes (appropriate for developmental age)  Prior Inpatient Therapy Prior Inpatient Therapy: Yes Prior Therapy Dates: March 2017 Prior Therapy Facilty/Provider(s): [facility] Reason for Treatment: Depression  Prior Outpatient Therapy Prior Outpatient Therapy: No Prior Therapy Dates: Scheduled to be see Prior Therapy Facilty/Provider(s): n/a Reason for Treatment: n/a Does patient have an ACCT team?: No Does patient have Intensive In-House Services?  : No Does patient have [facility] services? : No Does patient have P4CC services?: No  ADL Screening (condition at time of admission) Patient's cognitive ability adequate to safely complete daily activities?: Yes Is the patient deaf or have difficulty hearing?: No Does the patient have difficulty seeing,  even when wearing glasses/contacts?: No Does the patient have difficulty concentrating, remembering, or making decisions?: Yes Patient able to express need for assistance with ADLs?: Yes Does the patient have difficulty dressing or bathing?: No Independently performs ADLs?: Yes (appropriate for developmental age) Does the patient have difficulty walking or climbing stairs?: Yes ("balance is off real bad") Weakness of Legs: Both Weakness of Arms/Hands: Both  Home Assistive Devices/Equipment Home Assistive Devices/Equipment: None  Therapy Consults (therapy consults require a physician order) PT Evaluation Needed: Yes (Comment) (pt states losing so much weight has made it hard to balance and lift items) OT Evalulation Needed: Yes (Comment) (pt states since losing weight he has trouble balancing and picking up items) SLP Evaluation Needed: No Abuse/Neglect Assessment (Assessment to be complete while patient is alone) Physical Abuse: Denies Verbal Abuse: Denies Sexual Abuse: Denies Exploitation of patient/patient's resources: Denies Self-Neglect: Denies Values / Beliefs Cultural Requests During Hospitalization: None Spiritual Requests During Hospitalization: None Consults Spiritual Care Consult Needed: No Social Work Consult Needed: No Merchant navy officer (For Healthcare) Does patient have an advance directive?: No Would patient like information on creating an advanced directive?: No - patient declined information Nutrition Screen- [facility] Adult/[facility]/AP Patient's home diet: Regular Has the patient recently lost weight without trying?: Yes, 34 lbs. or greater (50-60 lbs in the last year) Has the patient been eating poorly because of a decreased appetite?: Yes Malnutrition Screening Tool Score: 5  Additional Information 1:1 In Past 12 Months?: No CIRT Risk: No Elopement Risk: No Does patient have medical clearance?: Yes  Disposition:  Disposition Initial Assessment Completed for  this Encounter: Yes Disposition of Patient: Inpatient treatment program Type of inpatient treatment program: Adult  On Site Evaluation by:   Reviewed with Physician:    Justice Deeds 01/04/2016 11:49 PM

## 2016-01-04 NOTE — ED Notes (Signed)
Per EMS report, patient is here for voluntary commitment for depression. Patient told EMS he just didn't want to go on anymore. Patient was seen here for same complaint and was prescribed meds and is non-compliant with taking them. Patient is depressed after wife died. Family told EMS that they bring in food for him and he refuses to eat it.

## 2016-01-04 NOTE — ED Notes (Signed)
Pt reports suicidal thoughts started about a week ago, hasn't eaten much and states he just does not care anymore.  No plan for suicide at this time and denies any HI.

## 2016-01-04 NOTE — H&P (Addendum)
Psychiatric Admission Assessment Adult  Patient Identification: IMER FOXWORTH MRN:  [id number] Date of Evaluation:  01/04/2016 Chief Complaint:  Depression Principal Diagnosis: Severe recurrent major depression without psychotic features [facility]) Diagnosis:   Patient Active Problem List   Diagnosis Date Noted  . Suicidal ideation [R45.851] 01/04/2016  . Severe recurrent major depression without psychotic features ([place]) [F33.2] 12/15/2015  . Grief [F43.21] 12/15/2015  . Weight loss [R63.4] 12/15/2015  . OCD (obsessive compulsive disorder) [F42.9] 12/15/2015  . Benign prostatic hyperplasia with urinary obstruction [N40.1] 11/24/2015  . 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 Mr. Buffin is a 65 year old male with a history of depression and anxiety.  Chief complaint. "I just don't care."  History of present illness. Information was obtained from the patient and the chart. The patient has a long history of anxiety with OCD type of symptoms that went mostly untreated. He became severely depressed and anxious after his wife passed away in 06-Mar-2015. He lost 35 pounds initially. He was hospitalized at [facility] in March and discharged 10 days ago. He has not been taking any medications at home, he has not been eating or drinking fluids. He did not follow-up with his appointments, grief counseling or senior center. He lost 16 pounds in the past 10 days. He lost interest in food and no longer feels hungry. He also has abdominal discomfort. He does not eat because he is afraid it will making sick. He reports poor sleep, decreased appetite, anhedonia, hopelessness worthlessness, poor energy and lack of interest, social isolation, crying spells, worsening of anxiety, and suicidal ideation. The patient has been trying to deal with it the best he could but has been struggling recently. He denies psychotic symptoms. He denies  symptoms suggestive of bipolar disorder. He does not use drugs or alcohol.  Past psychiatric history. He was hospitalized once recently He was tried on Remeron, Luvox and Trazodone. There were no suicide attempts. He has diagnoses of OCD and major depression.   Family psychiatric history. Father with mental illness mostly anxiety and depression.  Social history. He retired in March of last year. His wife passed away in 06-Mar-2023. He lives by himself. He has 2 adult children and grandchildren nearby but the patient has very little interest in interacting with his family even though he loves them dearly. He experiences some financial difficulties. He has not been eating even though his daughter brings him food everyday.  Total Time spent with patient: 1 hour  Past Psychiatric History: Depression, anxiety.  Is the patient at risk to self? Yes.    Has the patient been a risk to self in the past 6 months? Yes.    Has the patient been a risk to self within the distant past? No.  Is the patient a risk to others? No.  Has the patient been a risk to others in the past 6 months? No.  Has the patient been a risk to others within the distant past? No.   Prior Inpatient Therapy:   Prior Outpatient Therapy:    Alcohol Screening:   Substance Abuse History in the last 12 months:  No. Consequences of Substance Abuse: NA Previous Psychotropic Medications: Yes  Psychological Evaluations: Yes  Past Medical History:  Past Medical History  Diagnosis Date  . Meningitis     spinal  . OCD (obsessive compulsive disorder)   . Kidney stones     Past Surgical History  Procedure Laterality Date  . Kidney stone surgery     Family History:  Family History  Problem Relation Age of Onset  . Cancer Mother     bladder  . Emphysema Father   . Emphysema Sister   . COPD Sister   . Cancer Brother     prostate   Family Psychiatric  History: Depression and anxiety. Tobacco Screening:  _0 ([phone])::1)@ Social History:  History  Alcohol Use No     History  Drug Use No    Additional Social History:                           Allergies:   Allergies  Allergen Reactions  . Codeine Other (See Comments)    Unsure of reaction   Lab Results:  Results for orders placed or performed during the hospital encounter of 01/04/16 (from the past 48 hour(s))  Comprehensive metabolic panel     Status: Abnormal   Collection Time: 01/04/16  4:58 PM  Result Value Ref Range   Sodium 136 135 - 145 mmol/L   Potassium 3.6 3.5 - 5.1 mmol/L   Chloride 107 101 - 111 mmol/L   CO2 20 (L) 22 - 32 mmol/L   Glucose, Bld 92 65 - 99 mg/dL   BUN 25 (H) 6 - 20 mg/dL   Creatinine, Ser 1.06 0.61 - 1.24 mg/dL   Calcium 10.2 8.9 - 10.3 mg/dL   Total Protein 6.9 6.5 - 8.1 g/dL   Albumin 4.5 3.5 - 5.0 g/dL   AST 38 15 - 41 U/L   ALT 44 17 - 63 U/L   Alkaline Phosphatase 78 38 - 126 U/L   Total Bilirubin 2.0 (H) 0.3 - 1.2 mg/dL   GFR calc non Af Amer >60 >60 mL/min   GFR calc Af Amer >60 >60 mL/min    Comment: (NOTE) The eGFR has been calculated using the CKD EPI equation. This calculation has not been validated in all clinical situations. eGFR's persistently <60 mL/min signify possible Chronic Kidney Disease.    Anion gap 9 5 - 15  Ethanol (ETOH)     Status: None   Collection Time: 01/04/16  4:58 PM  Result Value Ref Range   Alcohol, Ethyl (B) <5 <5 mg/dL    Comment:        LOWEST DETECTABLE LIMIT FOR SERUM ALCOHOL IS 5 mg/dL FOR MEDICAL PURPOSES ONLY   Salicylate level     Status: None   Collection Time: 01/04/16  4:58 PM  Result Value Ref Range   Salicylate Lvl <6.3 2.8 - 30.0 mg/dL  Acetaminophen level     Status: Abnormal   Collection Time: 01/04/16  4:58 PM  Result Value Ref Range   Acetaminophen (Tylenol), Serum <10 (L) 10 - 30 ug/mL    Comment:        THERAPEUTIC CONCENTRATIONS VARY SIGNIFICANTLY. A RANGE OF 10-30 ug/mL MAY BE AN EFFECTIVE CONCENTRATION  FOR MANY PATIENTS. HOWEVER, SOME ARE BEST TREATED AT CONCENTRATIONS OUTSIDE THIS RANGE. ACETAMINOPHEN CONCENTRATIONS >150 ug/mL AT 4 HOURS AFTER INGESTION AND >50 ug/mL AT 12 HOURS AFTER INGESTION ARE OFTEN ASSOCIATED WITH TOXIC REACTIONS.   CBC     Status: Abnormal   Collection Time: 01/04/16  4:58 PM  Result Value Ref Range   WBC 11.2 (H) 3.8 - 10.6 K/uL   RBC 6.02 (H) 4.40 - 5.90 MIL/uL   Hemoglobin 18.6 (H) 13.0 - 18.0 g/dL   HCT 53.4 (H) 40.0 -  52.0 %   MCV 88.7 80.0 - 100.0 fL   MCH 30.9 26.0 - 34.0 pg   MCHC 34.8 32.0 - 36.0 g/dL   RDW 13.1 11.5 - 14.5 %   Platelets 192 150 - 440 K/uL  Urine Drug Screen, Qualitative ([place] only)     Status: None   Collection Time: 01/04/16  4:58 PM  Result Value Ref Range   Tricyclic, Ur Screen NONE DETECTED NONE DETECTED   Amphetamines, Ur Screen NONE DETECTED NONE DETECTED   MDMA (Ecstasy)Ur Screen NONE DETECTED NONE DETECTED   Cocaine Metabolite,Ur [place] NONE DETECTED NONE DETECTED   Opiate, Ur Screen NONE DETECTED NONE DETECTED   Phencyclidine (PCP) Ur S NONE DETECTED NONE DETECTED   Cannabinoid 50 Ng, Ur [place] NONE DETECTED NONE DETECTED   Barbiturates, Ur Screen NONE DETECTED NONE DETECTED   Benzodiazepine, Ur Scrn NONE DETECTED NONE DETECTED   Methadone Scn, Ur NONE DETECTED NONE DETECTED    Comment: (NOTE) 761  Tricyclics, urine               Cutoff 1000 ng/mL 200  Amphetamines, urine             Cutoff 1000 ng/mL 300  MDMA (Ecstasy), urine           Cutoff 500 ng/mL 400  Cocaine Metabolite, urine       Cutoff 300 ng/mL 500  Opiate, urine                   Cutoff 300 ng/mL 600  Phencyclidine (PCP), urine      Cutoff 25 ng/mL 700  Cannabinoid, urine              Cutoff 50 ng/mL 800  Barbiturates, urine             Cutoff 200 ng/mL 900  Benzodiazepine, urine           Cutoff 200 ng/mL 1000 Methadone, urine                Cutoff 300 ng/mL 1100 1200 The urine drug screen provides only a preliminary, unconfirmed 1300 analytical  test result and should not be used for non-medical 1400 purposes. Clinical consideration and professional judgment should 1500 be applied to any positive drug screen result due to possible 1600 interfering substances. A more specific alternate chemical method 1700 must be used in order to obtain a confirmed analytical result.  1800 Gas chromato graphy / mass spectrometry (GC/MS) is the preferred 1900 confirmatory method.     Blood Alcohol level:  Lab Results  Component Value Date   ETH <5 01/04/2016   ETH <5 95/06/3266    Metabolic Disorder Labs:  No results found for: HGBA1C, MPG No results found for: PROLACTIN No results found for: CHOL, TRIG, HDL, CHOLHDL, VLDL, LDLCALC  Current Medications: Current Facility-Administered Medications  Medication Dose Route Frequency Provider Last Rate Last Dose  . acetaminophen (TYLENOL) tablet 650 mg  650 mg Oral Q6H PRN Gonzella Lex, MD      . alum & mag hydroxide-simeth (MAALOX/MYLANTA) 200-200-20 MG/5ML suspension 30 mL  30 mL Oral Q4H PRN Gonzella Lex, MD      . feeding supplement (ENSURE ENLIVE) (ENSURE ENLIVE) liquid 237 mL  237 mL Oral TID BM Basilia Stuckert B Teal Bontrager, MD      . fluvoxaMINE (LUVOX) tablet 100 mg  100 mg Oral QHS Adryana Mogensen B Zeki Bedrosian, MD      . magnesium hydroxide (MILK OF MAGNESIA) suspension  30 mL  30 mL Oral Daily PRN Gonzella Lex, MD      . risperiDONE (RISPERDAL) tablet 1 mg  1 mg Oral QHS Marin Wisner B Dashonda Bonneau, MD      . Derrill Memo ON 01/05/2016] tamsulosin (FLOMAX) capsule 0.4 mg  0.4 mg Oral QPC supper Gonzella Lex, MD      . traZODone (DESYREL) tablet 50 mg  50 mg Oral QHS PRN Gonzella Lex, MD       PTA Medications: Prescriptions prior to admission  Medication Sig Dispense Refill Last Dose  . tamsulosin (FLOMAX) 0.4 MG CAPS capsule Take 1 capsule (0.4 mg total) by mouth 2 (two) times daily. 30 capsule 0 Taking    Musculoskeletal: Strength & Muscle Tone: within normal limits Gait & Station: normal Patient  leans: N/A  Psychiatric Specialty Exam: I reviewed physical exam performed in the emergency room and agree with her findings. Physical Exam  Nursing note and vitals reviewed.   Review of Systems  Constitutional: Positive for weight loss and malaise/fatigue.  Gastrointestinal: Positive for abdominal pain.  Neurological: Positive for weakness.  Psychiatric/Behavioral: Positive for depression.  All other systems reviewed and are negative.   Blood pressure 145/92, pulse 108, temperature 98 F (36.7 C), temperature source Oral, resp. rate 18, height 5' 9" (1.753 m), weight 68.493 kg (151 lb), SpO2 100 %.Body mass index is 22.29 kg/(m^2).  See SRA.                                                       Treatment Plan Summary: Daily contact with patient to assess and evaluate symptoms and progress in treatment and Medication management   Mr. Maltos is a 65 year old male with a history of anxiety and depression admitted for worsening of depression, malaise and severe weight loss in the context of recent major loss.  1. Suicidal ideation. The patient is able to contract for safety in the hospital.    2. Mood and anxiety. We restarted Luvox for depression and OCD type of symptoms. We added Zyprexa for augmentation.  3. Insomnia. He responded well to trazodone in the past.  4. BPH. We continued Flomax.  5. Failure to thrive. We offered Megace and Ensure. He used to complain of early satiety. He now lost appetite altogether and complains of abdominal discomfort. We will ask medicine for advise..   6. Cognitive decline. We will order psychological testing.   7. Metabolic syndrome monitoring. Lipid profile and TSH are normal. Hemoglobin A1c and prolactin are pending.  8. R/O Vitamin B12 deficiency. Folic acid is normal vitamin B12 pending.  9. Deconditioning. PT consult is appreciated.   10. Disposition. The patient was discharged home. He will follow-up with [facility].    Observation Level/Precautions:  15 minute checks  Laboratory:  CBC Chemistry Profile Folic Acid UDS UA Vitamin B-12  Psychotherapy:    Medications:    Consultations:    Discharge Concerns:    Estimated LOS:  Other:     I certify that inpatient services furnished can reasonably be expected to improve the patient's condition.    Orson Slick, MD 3/20/20178:42 PM

## 2016-01-05 ENCOUNTER — Encounter: Payer: Self-pay | Admitting: Radiology

## 2016-01-05 ENCOUNTER — Inpatient Hospital Stay: Payer: BLUE CROSS/BLUE SHIELD

## 2016-01-05 LAB — LIPID PANEL
CHOL/HDL RATIO: 3.3 ratio
CHOLESTEROL: 140 mg/dL (ref 0–200)
HDL: 43 mg/dL (ref >40)
LDL CALC: 79 mg/dL (ref 0–99)
Triglycerides: 90 mg/dL (ref <150)
VLDL: 18 mg/dL (ref 0–40)

## 2016-01-05 LAB — FOLATE: Folate: 10 ng/mL (ref >5.9)

## 2016-01-05 LAB — HEMOGLOBIN A1C: Hgb A1c MFr Bld: 5 % (ref 4.0–6.0)

## 2016-01-05 LAB — TSH: TSH: 1.17 u[IU]/mL (ref 0.350–4.500)

## 2016-01-05 LAB — GLUCOSE, CAPILLARY: Glucose-Capillary: 117 mg/dL — ABNORMAL HIGH (ref 65–99)

## 2016-01-05 LAB — VITAMIN B12: VITAMIN B 12: 517 pg/mL (ref 180–914)

## 2016-01-05 MED ORDER — OLANZAPINE 5 MG PO TBDP
5.0000 mg | ORAL_TABLET | Freq: Every day | ORAL | Status: DC
  Administered 2016-01-05 – 2016-01-06 (×2): 5 mg via ORAL
  Filled 2016-01-05 (×2): qty 1

## 2016-01-05 MED ORDER — MEGESTROL ACETATE 400 MG/10ML PO SUSP
400.0000 mg | Freq: Two times a day (BID) | ORAL | Status: DC
  Administered 2016-01-05 – 2016-01-29 (×49): 400 mg via ORAL
  Filled 2016-01-05 (×49): qty 10

## 2016-01-05 MED ORDER — IOHEXOL 300 MG/ML  SOLN
75.0000 mL | Freq: Once | INTRAMUSCULAR | Status: AC | PRN
  Administered 2016-01-05: 75 mL via INTRAVENOUS

## 2016-01-05 MED ORDER — IOHEXOL 240 MG/ML SOLN
25.0000 mL | INTRAMUSCULAR | Status: AC
  Administered 2016-01-05 (×2): 25 mL via ORAL

## 2016-01-05 NOTE — Progress Notes (Signed)
Recreation Therapy Notes  Date: 03.21.17 Time: 3:10 pm Location: Craft Room  Group Topic: Goal Setting  Goal Area(s) Addresses:  Patient will write down at least one goal. Patient will write down at least one obstacle.  Behavioral Response: Did not attend  Intervention: Recovery Goal Chart  Activity: Patients were instructed to make a Recovery Goal Chart including goals, obstacles, the date the started working on their goal, and the date they achieved their goal.  Education: LRT educated patients on healthy ways to celebrate reaching their goals.  Education Outcome: Patient did not attend group.  Clinical Observations/Feedback: Patient did not attend group.  Jacquelynn CreeGreene,Kiandre Spagnolo M, LRT/CTRS 01/05/2016 4:11 PM

## 2016-01-05 NOTE — Progress Notes (Signed)
Pt became faint when he came up to get vitals taken. BP 96/73 pulse ox 100% pulse 120. Pt stated he hasn't eaten in 4 days and feels like he would throw up anything he tried to eat. A mostly uneaten sandwich was bedside, pt had refused Ensure earlier. Paged Dr Demetrius CharityP to let her know.

## 2016-01-05 NOTE — Progress Notes (Signed)
MD orders psychological consult, PT consult and internal medicine consult and all consults notified. [facility] Graciela HusbandsKlein (on call 5 pm to 8 am )

## 2016-01-05 NOTE — Progress Notes (Signed)
Dillon [name] from cat scan phoned to arrange cat scan. Patient ate small amount of dinner. Dillon [name] states cat scan will wait for few hours for stomach to empty.

## 2016-01-05 NOTE — Progress Notes (Signed)
Recreation Therapy Notes  At approximately 9:30 am, LRT attempted assessment. Patient refused and requested for LRT to come back tomorrow.  Jacquelynn CreeGreene,Jayanth Szczesniak M, LRT/CTRS 01/05/2016 1:48 PM

## 2016-01-05 NOTE — Tx Team (Signed)
Interdisciplinary Treatment Plan Update (Adult)  Date:  01/05/2016 Time Reviewed:  4:50 PM  Progress in Treatment: Attending groups: No. Participating in groups:  No. Taking medication as prescribed:  Yes. Tolerating medication:  Yes. Family/Significant othe contact made:  Yes, individual(s) contacted:  Daughter, Clent Ridges Patient understands diagnosis:  Yes. Discussing patient identified problems/goals with staff:  Yes. Medical problems stabilized or resolved:  Yes. Denies suicidal/homicidal ideation: Yes. Issues/concerns per patient self-inventory:  Yes. Other:  New problem(s) identified: Yes, Pt has minimal oral intake which is concerning with significant weight loss.  Discharge Plan or Barriers: Home and follow up med management and therapy with [facility] in [place]  Reason for Continuation of Hospitalization: Anxiety Delusions  Depression Other; describe poor oral intake and weight loss  Comments:The patient has a long history of anxiety with OCD type of symptoms that went mostly untreated. He became severely depressed and anxious after his wife passed away in 13-Feb-2015. He lost 35 pounds initially. He was hospitalized at [facility] in March and discharged 10 days ago. He has not been taking any medications at home, he has not been eating or drinking fluids. He did not follow-up with his appointments, grief counseling or senior center. He lost 16 pounds in the past 10 days. He lost interest in food and no longer feels hungry. He also has abdominal discomfort. He does not eat because he is afraid it will making sick. He reports poor sleep, decreased appetite, anhedonia, hopelessness worthlessness, poor energy and lack of interest, social isolation, crying spells, worsening of anxiety, and suicidal ideation. The patient has been trying to deal with it the best he could but has been struggling recently. He denies psychotic symptoms. He denies symptoms suggestive of  bipolar disorder. He does not use drugs or alcohol  Estimated length of stay: 7 days   New goal(s):  Review of initial/current patient goals per problem list:   1.  Goal(s):Pt will participate in aftercare plan.  Met:  No  Target date:discharge  As evidenced CY:ELYH for housing and psychiatric follow up being identified  2.  Goal (s):Pt will exhibit decreased depressive symptoms and suicidal ideation.  Met:  No  Target date:Discharge  As evidenced by:Pt will utilize self-rating of depression at 3 or below and demonstrate decreased signs of depression OR be deemed stable by MD.   Attendees: Patient:   3/21/20174:50 PM  Family:   3/21/20174:50 PM  Physician:  Orson Slick 3/21/20174:50 PM  Nursing:   Delsa Sale, RN 3/21/20174:50 PM  Case Manager:   3/21/20174:50 PM  Counselor:  Dossie Arbour, LCSW 3/21/20174:50 PM  Other:  Everitt Amber, [place] 3/21/20174:50 PM  Other:   3/21/20174:50 PM  Other:   3/21/20174:50 PM  Other:  3/21/20174:50 PM  Other:  3/21/20174:50 PM  Other:  3/21/20174:50 PM  Other:  3/21/20174:50 PM  Other:  3/21/20174:50 PM  Other:  3/21/20174:50 PM  Other:   3/21/20174:50 PM   Scribe for Treatment Team:   August Saucer, 01/05/2016, 4:50 PM, MSW, LCSW

## 2016-01-05 NOTE — [facility] Suicide Risk Assessment (Signed)
[facility] Admission Suicide Risk Assessment   Nursing information obtained from:  Patient Demographic factors:  Male, Divorced or widowed, Living alone Current Mental Status:  NA (denies) Loss Factors:  Financial problems / change in socioeconomic status Historical Factors:  NA Risk Reduction Factors:  Positive social support, Religious beliefs about death  Total Time spent with patient: 1 hour Principal Problem: Severe recurrent major depression without psychotic features ([facility]) Diagnosis:   Patient Active Problem List   Diagnosis Date Noted  . Suicidal ideation [R45.851] 01/04/2016  . Severe recurrent major depression without psychotic features ([facility]) [F33.2] 12/15/2015  . Grief [F43.21] 12/15/2015  . Weight loss [R63.4] 12/15/2015  . OCD (obsessive compulsive disorder) [F42.9] 12/15/2015  . Benign prostatic hyperplasia with urinary obstruction [N40.1] 11/24/2015  . 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  Continued Clinical Symptoms:  Alcohol Use Disorder Identification Test Final Score (AUDIT): 0 The "Alcohol Use Disorders Identification Test", Guidelines for Use in Primary Care, Second Edition.  World Science writer [facility]). Score between 0-7:  no or low risk or alcohol related problems. Score between 8-15:  moderate risk of alcohol related problems. Score between 16-19:  high risk of alcohol related problems. Score 20 or above:  warrants further diagnostic evaluation for alcohol dependence and treatment.   CLINICAL FACTORS:   Severe Anxiety and/or Agitation Depression:   Anhedonia Hopelessness Severe   Musculoskeletal: Strength & Muscle Tone: within normal limits Gait & Station: normal Patient leans: N/A  Psychiatric Specialty Exam: Review of Systems  Constitutional: Positive for malaise/fatigue.  Gastrointestinal: Positive for abdominal pain.  Neurological: Positive for weakness.   Psychiatric/Behavioral: Positive for depression.  All other systems reviewed and are negative.   Blood pressure 96/73, pulse 120, temperature 98.8 F (37.1 C), temperature source Oral, resp. rate 16, height  (1.753 m), weight 68.493 kg (151 lb), SpO2 100 %.Body mass index is 22.29 kg/(m^2).  General Appearance: Fairly Groomed  Patent attorney::  Minimal  Speech:  Clear and Coherent  Volume:  Decreased  Mood:  Depressed, Hopeless and Worthless  Affect:  Blunt  Thought Process:  Goal Directed  Orientation:  Full (Time, Place, and Person)  Thought Content:  Delusions and Paranoid Ideation  Suicidal Thoughts:  Yes.  with intent/plan  Homicidal Thoughts:  No  Memory:  Immediate;   Fair Recent;   Fair Remote;   Fair  Judgement:  Poor  Insight:  Shallow  Psychomotor Activity:  Decreased  Concentration:  Fair  Recall:  [employer] of Knowledge:Fair  Language: Fair  Akathisia:  No  Handed:  Right  AIMS (if indicated):     Assets:  Communication Skills Desire for Improvement Financial Resources/Insurance Housing Social Support  Sleep:  Number of Hours: 7.15  Cognition: WNL  ADL's:  Intact    COGNITIVE FEATURES THAT CONTRIBUTE TO RISK:  None    SUICIDE RISK:   Moderate:  Frequent suicidal ideation with limited intensity, and duration, some specificity in terms of plans, no associated intent, good self-control, limited dysphoria/symptomatology, some risk factors present, and identifiable protective factors, including available and accessible social support.  PLAN OF CARE: Hospital admission, medication management, discharge planning.  Mr. Metzgar is a 65 year old male with a history of anxiety and depression admitted for worsening of depression, malaise and severe weight loss in the context of recent major loss.  1. Suicidal ideation. The patient is able to contract for safety in the hospital.    2. Mood and anxiety.  We restarted Luvox for depression and OCD type of symptoms.  We added Zyprexa for augmentation.  3. Insomnia. He responded well to trazodone in the past.  4. BPH. We continued Flomax.  5. Failure to thrive. We offered Megace and Ensure. He used to complain of early satiety. He now lost appetite altogether and complains of abdominal discomfort. We will ask medicine for advise..   6. Cognitive decline. We will order psychological testing.   7. Metabolic syndrome monitoring. Lipid profile and TSH are normal. Hemoglobin A1c and prolactin are pending.  8. R/O Vitamin B12 deficiency. Folic acid is normal vitamin B12 pending.  9. Disposition. The patient was discharged home. He will follow-up with [facility].    I certify that inpatient services furnished can reasonably be expected to improve the patient's condition.   Kristine LineaJolanta Amyla Heffner, MD 01/05/2016, 1:07 PM

## 2016-01-05 NOTE — Progress Notes (Signed)
Initial Nutrition Assessment   INTERVENTION:   Meals and Snacks: Cater to patient preferences on Regular diet order, pt may benefit from double portions and/or snacks between meals Medical Food Supplement Therapy: agree with Ensure Enlive po TID, each supplement provides 350 kcal and 20 grams of protein   NUTRITION DIAGNOSIS:   Unintentional weight loss related to social / environmental circumstances, poor appetite as evidenced by percent weight loss.  GOAL:   Patient will meet greater than or equal to 90% of their needs  MONITOR:   PO intake, Supplement acceptance, Labs, Weight trends, I & O's  REASON FOR ASSESSMENT:   Malnutrition Screening Tool    ASSESSMENT:    Pt admitted with depression without psyhcotic features.  Past Medical History  Diagnosis Date  . Meningitis     spinal  . OCD (obsessive compulsive disorder)   . Kidney stones      Diet Order:  Diet regular Room service appropriate?: Yes; Fluid consistency:: Thin    Current Nutrition: Recorded po intake 0% of breakfast this am and refused am Ensure. Per Nsg pt refusing meal trays as pt reports he would vomit if he tried.  Food/Nutrition-Related History: Per Nsg chart, pt reports he hasn't eaten in 4 days.Per  Chart review, pt with recent admission at the beginning of the month. At that time, pt eating 76% of meals (documented 12/18/2015).   Scheduled Medications:  . feeding supplement (ENSURE ENLIVE)  237 mL Oral TID BM  . fluvoxaMINE  100 mg Oral QHS  . risperiDONE  1 mg Oral QHS  . tamsulosin  0.4 mg Oral QPC supper     Electrolyte/Renal Profile and Glucose Profile:   Recent Labs Lab 01/04/16 1658  NA 136  K 3.6  CL 107  CO2 20*  BUN 25*  CREATININE 1.06  CALCIUM 10.2  GLUCOSE 92   Protein Profile:  Recent Labs Lab 01/04/16 1658  ALBUMIN 4.5    Gastrointestinal Profile: Last BM:   Nutrition-Focused Physical Exam Findings:  Unable to complete Nutrition-Focused physical exam at  this time.    Weight Change: Per MD note pt endorses 50-60lbs weight loss in one year. RD also notes per MD note pt reported 35lbs weight loss since April 2016 when wife passed away.  (27% weight loss in 5 months per [facility] weight encounters)   Height:   Ht Readings from Last 1 Encounters:  01/04/16 5\' 9"  (1.753 m)    Weight:   Wt Readings from Last 1 Encounters:  01/04/16 151 lb (68.493 kg)   Wt Readings from Last 10 Encounters:  01/04/16 151 lb (68.493 kg)  01/04/16 165 lb (74.844 kg)  12/25/15 166 lb (75.297 kg)  12/15/15 165 lb (74.844 kg)  12/15/15 180 lb (81.647 kg)  12/15/15 168 lb 6.4 oz (76.386 kg)  12/01/15 182 lb 3.2 oz (82.645 kg)  09/07/15 206 lb 12.8 oz (93.804 kg)    BMI:  Body mass index is 22.29 kg/(m^2).  Estimated Nutritional Needs:   Kcal:  1715-2055kcals  Protein:  55-68g protein (0.8-1.0g/kg)  Fluid:  1700-22400mL of fluid  EDUCATION NEEDS:   No education needs identified at this time   MODERATE Care Level  Leda QuailAllyson Denny Lave, RD, LDN Pager [phone] Weekend/On-Call Pager [phone]

## 2016-01-05 NOTE — BHH Group Notes (Signed)
[facility] LCSW Group Therapy   01/05/2016 11am  Type of Therapy: Group Therapy   Participation Level: Did Not Attend. Patient invited to participate but declined.    Karisma Meiser F. Griselda Tosh, MSW, LCSWA, LCAS   

## 2016-01-05 NOTE — Consult Note (Addendum)
[facility] [place] at [facility]   PATIENT NAME: Dillon [name]    MR#:  [id number]  DATE OF BIRTH:  1951/07/31  DATE OF ADMISSION:  01/04/2016  PRIMARY CARE PHYSICIAN: Dillon Cirriheryl Wicker, NP   REQUESTING/REFERRING PHYSICIAN: Dr. Kristine LineaJolanta [name]  CHIEF COMPLAINT:  No chief complaint on file.   HISTORY OF PRESENT ILLNESS:  Dillon [name]  is a 65 y.o. male with a known history of renal stones, depression, obsessive-compulsive disorder has been admitted to behavioral medicine for major depression following his wife's death last April. Since then he has had decreased appetite and hasn't been eating or drinking fluids. He did not follow up with his appointments for grief counseling. He had lost weight initially. But over the last 10 days. He has lost almost 16 pounds. So medical consult was requested to see if there is any other medical reason for her weight loss other than depression. Patient denies any cough, hemoptysis. No history of smoking. Hasn't had a colonoscopy. Has occasional constipation but denies any blood in the stool. Has some vague abdominal discomfort, worse in the right upper quadrant region. He had some urinary issues with hesitancy and was started on Flomax in the past. Denies any active issues at this time. Denies any hematuria. No history of any eating disorders.  PAST MEDICAL HISTORY:   Past Medical History  Diagnosis Date  . Meningitis     spinal  . OCD (obsessive compulsive disorder)   . Kidney stones     PAST SURGICAL HISTOIRY:   Past Surgical History  Procedure Laterality Date  . Kidney stone surgery      SOCIAL HISTORY:   Social History  Substance Use Topics  . Smoking status: Never Smoker   . Smokeless tobacco: Never Used  . Alcohol Use: No    FAMILY HISTORY:   Family History  Problem Relation Age of Onset  . Cancer Mother     bladder  . Emphysema Father   . Emphysema Sister   . COPD Sister   . Cancer Brother    prostate    DRUG ALLERGIES:   Allergies  Allergen Reactions  . Codeine Other (See Comments)    Unsure of reaction    REVIEW OF SYSTEMS:   Review of Systems  Constitutional: Positive for weight loss and malaise/fatigue. Negative for fever and chills.       Decreased appetite  HENT: Negative for ear discharge, ear pain, hearing loss, nosebleeds and tinnitus.   Eyes: Negative for blurred vision, double vision and photophobia.  Respiratory: Negative for cough, hemoptysis, shortness of breath and wheezing.   Cardiovascular: Negative for chest pain, palpitations, orthopnea and leg swelling.  Gastrointestinal: Positive for constipation. Negative for heartburn, nausea, vomiting, abdominal pain, diarrhea and melena.  Genitourinary: Negative for dysuria, urgency, frequency and hematuria.  Musculoskeletal: Negative for myalgias, back pain and neck pain.  Skin: Negative for rash.  Neurological: Negative for dizziness, tingling, sensory change, speech change, focal weakness and headaches.  Endo/Heme/Allergies: Does not bruise/bleed easily.  Psychiatric/Behavioral: Positive for depression. The patient is not nervous/anxious.     MEDICATIONS AT HOME:   Prior to Admission medications   Medication Sig Start Date End Date Taking? Authorizing Provider  tamsulosin (FLOMAX) 0.4 MG CAPS capsule Take 1 capsule (0.4 mg total) by mouth 2 (two) times daily. 12/21/15   Shari ProwsJolanta B Pucilowska, MD      VITAL SIGNS:  Blood pressure 96/73, pulse 120, temperature 98.8 F (37.1 C), temperature source Oral, resp.  rate 16, height  (1.753 m), weight 67.586 kg (149 lb), SpO2 100 %.  PHYSICAL EXAMINATION:   Physical Exam  GENERAL:  65 y.o.-year-old patient lying in the bed with no acute distress. Proper eye contact maintained. EYES: Pupils equal, round, reactive to light and accommodation. No scleral icterus. Extraocular muscles intact.  HEENT: Head atraumatic, normocephalic. Oropharynx and nasopharynx  clear. Dry mucous membranes noted NECK:  Supple, no jugular venous distention. No thyroid enlargement, no tenderness.  LUNGS: Normal breath sounds bilaterally, no wheezing, rales,rhonchi or crepitation. No use of accessory muscles of respiration. Decreased bibasilar breath sounds CARDIOVASCULAR: S1, S2 normal. No murmurs, rubs, or gallops.  ABDOMEN: Soft, discomfort in the right upper quadrant on palpation, no organomegaly. No guarding or rigidity., nondistended. Bowel sounds present. No organomegaly or mass.  EXTREMITIES: No pedal edema, cyanosis, or clubbing.  NEUROLOGIC: Cranial nerves II through XII are intact. Muscle strength 5/5 in all extremities. Sensation intact. Gait not checked.  PSYCHIATRIC: The patient is alert and oriented x 3.  SKIN: No obvious rash, lesion, or ulcer.   LABORATORY PANEL:   CBC  Recent Labs Lab 01/04/16 1658  WBC 11.2*  HGB 18.6*  HCT 53.4*  PLT 192   ------------------------------------------------------------------------------------------------------------------  Chemistries   Recent Labs Lab 01/04/16 1658  NA 136  K 3.6  CL 107  CO2 20*  GLUCOSE 92  BUN 25*  CREATININE 1.06  CALCIUM 10.2  AST 38  ALT 44  ALKPHOS 78  BILITOT 2.0*   ------------------------------------------------------------------------------------------------------------------  Cardiac Enzymes No results for input(s): TROPONINI in the last 168 hours. ------------------------------------------------------------------------------------------------------------------  RADIOLOGY:  No results found.  EKG:   Orders placed or performed during the hospital encounter of 11/29/15  . ED EKG  . ED EKG  . EKG 12-Lead  . EKG 12-Lead  . EKG    IMPRESSION AND PLAN:   Dillon [name]  is a 65 y.o. male with a known history of renal stones, depression, obsessive-compulsive disorder has been admitted to behavioral medicine for major depression. Medical consult requested for  ongoing weight loss.  #1 Anorexia with weight loss-could be related to underlying depression. Patient denies any other symptoms that would point towards a particular organ system. -Will need screening colonoscopy as outpatient. -Due to abdominal discomfort and constipation, ordered a CT of abdomen today. Continue Megace. -Encouraged intake of more fluids by mouth. -If CT abdomen is negative, further workup can be done as outpatient - TSH within normal limits  #2 major depression and OCD- not suicidal - mgmt per psych. - on Luvox, Trazodone and zyprexa Had tried remeron in the past without much use.  #3 BPH- continue Flomax  #4 DVT Prophylaxis- patient is ambulatory   All the records are reviewed and case discussed with Consulting provider. Management plans discussed with the patient, family and they are in agreement.  CODE STATUS: Full Code  TOTAL TIME TAKING CARE OF THIS PATIENT: 50 minutes.    Enid Baas M.D on 01/05/2016 at 7:07 PM  Between 7am to 6pm - Pager - [phone]  After 6pm go to [email or web address] - password [email or web address] [facility]  [place] [facility]  Office  [phone]  CC: Primary care Physician: Dillon Cirri, NP

## 2016-01-05 NOTE — Progress Notes (Signed)
Patient with depressed affect, quiet speech, little eye contact. Patient with minimal interaction with peers, does attend outside therapy group for a little while then asks to return to room. Patient tired, with poor appetite and poor po intake. MD and writer assess and attempt to discuss plan of care with patient. Family calls and social worker aware family would like plan of care update. No SI/HI. Consults ordered and consultants notified. Safety maintained.

## 2016-01-05 NOTE — Tx Team (Signed)
Initial Interdisciplinary Treatment Plan   PATIENT STRESSORS: Financial difficulties Health problems   PATIENT STRENGTHS: Active sense of humor Capable of independent living   PROBLEM LIST: Problem List/Patient Goals Date to be addressed Date deferred Reason deferred Estimated date of resolution  Depression "I have been depressed and have lost weight." 01/04/16     Medication Compliance "I don't know my meds and I need to get back taking them." 01/04/16                                                DISCHARGE CRITERIA:  Ability to meet basic life and health needs Improved stabilization in mood, thinking, and/or behavior  PRELIMINARY DISCHARGE PLAN: Return to previous living arrangement Return to previous work or school arrangements  PATIENT/FAMIILY INVOLVEMENT: This treatment plan has been presented to and reviewed with the patient, Dillon PriestRobert H [name].  The patient and family have been given the opportunity to ask questions and make suggestions.  [name] 01/05/2016, 2:18 AM

## 2016-01-06 LAB — PROLACTIN: Prolactin: 29.7 ng/mL — ABNORMAL HIGH (ref 4.0–15.2)

## 2016-01-06 NOTE — [facility] Group Notes (Signed)
[facility] LCSW Aftercare Discharge Planning Group Note  01/06/2016 9:30 AM  Participation Quality: Did Not Attend. Patient invited to participate but declined.   Jyll Tomaro F. Zamyra Allensworth, MSW, LCSWA, LCAS   

## 2016-01-06 NOTE — Plan of Care (Signed)
Problem: [facility] Patient Education Outcome: Progressing Cooperative with meds, took Megace, drinking nutritional shakes and ate some lunch. Remains depressed.

## 2016-01-06 NOTE — Progress Notes (Signed)
Recreation Therapy Notes  Date: 03.22.17 Time: 1:00 pm Location: Craft Room  Group Topic: Self-esteem  Goal Area(s) Addresses:  Patient will write at least one positive trait about self. Patient will verbalize benefit of having a healthy self-esteem.  Behavioral Response: Did not attend  Intervention: I Am  Activity: Patients were given a worksheet with the letter I on it and instructed to write as many positive traits about themselves inside the letter I.  Education: LRT educated patients on ways they can increase their self-esteem.  Education Outcome: Patient did not attend group.   Clinical Observations/Feedback: Patient did not attend group.  Tashonna Descoteaux M, LRT/CTRS 01/06/2016 2:56 PM 

## 2016-01-06 NOTE — [facility] Group Notes (Signed)
[facility] Group Notes:  (Nursing/MHT/Case Management/Adjunct)  Date:  01/06/2016  Time:  1:13 AM  Type of Therapy:  Group Therapy  Participation Level:  Did Not Attend  P Summary of Progress/Problems:  Veva Holesshley Imani Doralyn Kirkes 01/06/2016, 1:13 AM

## 2016-01-06 NOTE — [facility] Group Notes (Signed)
[facility] Group Notes:  (Nursing/MHT/Case Management/Adjunct)  Date:  01/06/2016  Time:  9:29 PM  Type of Therapy:  Evening Wrap-up Group  Participation Level:  Did Not Attend  Participation Quality:  N/A  Affect:  N/A  Cognitive:  N/A  Insight:  None  Engagement in Group:  Did Not Attend  Modes of Intervention:  Discussion  Summary of Progress/Problems:  Tomasita MorrowChelsea Nanta Kirston Luty 01/06/2016, 9:29 PM

## 2016-01-06 NOTE — BHH Group Notes (Signed)
[facility] LCSW Group Therapy   01/06/2016 1pm  Type of Therapy: Group Therapy   Participation Level: Did Not Attend. Patient invited to participate but declined.    Lakynn Halvorsen F. Whitnie Deleon, MSW, LCSWA, LCAS   

## 2016-01-06 NOTE — Evaluation (Signed)
Physical Therapy Evaluation Patient Details Name: Dillon [name] MRN: [id number] DOB: 03-28-1951 Today's Date: 01/06/2016   History of Present Illness  The patient has a long history of anxiety with OCD type of symptoms that went mostly untreated. He became severely depressed and anxious after his wife passed away in 02-14-2015. He lost 35 pounds initially. He was hospitalized at [facility] in March and discharged 10 days ago. He has not been taking any medications at home, he has not been eating or drinking fluids. He did not follow-up with his appointments, grief counseling or senior center. He lost 16 pounds in the past 10 days. He lost interest in food and no longer feels hungry. He also has abdominal discomfort. He does not eat because he is afraid it will making sick. He reports poor sleep, decreased appetite, anhedonia, hopelessness worthlessness, poor energy and lack of interest, social isolation, crying spells, worsening of anxiety, and suicidal ideation. The patient has been trying to deal with it the best he could but has been struggling recently. He denies psychotic symptoms. He denies symptoms suggestive of bipolar disorder. He does not use drugs or alcohol. Pt was admitted to behavioral medicine unit at [facility] for depressive episode with suicidal ideation. Pt is pleasant upon arrival with physical therapist although feels generally weak and low on energy. He reports full independence with ADLs/IADLs prior to arrival. He typically does not ambulate with an assistive device and denies any falls over the last 12 months.   Clinical Impression  Pt agrees to work with therapist but is exceedingly weak due to poor po intake. Pt appears severely dehydrated with poor skin turgor as seen with tenting of skin on dorsal aspect of hands. Pt independent with bed mobility however with sit to stand he becomes very lightheaded and dizzy complaining of presyncopal symptoms. Orthostatic vitals  obtained and while BP remains completely unchanged from sit to stand (103/75), HR increases from 103 to 140. HR remains elevated at 140 for at least 3 minutes while standing. Pt unsafe to ambulate at this time due to concerns for elevated HR and risk for syncope due to dehydration. Pt with good muscle bulk and appears to likely have good baseline strength but clearly weak on this date due to poor caloric and fluid intake. Recommend HH PT for conditioning, balance, and safety but it will not be of benefit unless pt improves po intake. Pt will benefit from skilled PT services to address deficits in strength, balance, and mobility in order to return to full function at home.     Follow Up Recommendations Home health PT;Other (comment) (For strengthening/balance if pt able to improve po intake)    Equipment Recommendations  Rolling walker with 5" wheels    Recommendations for Other Services       Precautions / Restrictions Precautions Precautions: Fall Restrictions Weight Bearing Restrictions: No      Mobility  Bed Mobility Overal bed mobility: Independent             General bed mobility comments: Pt demonstrates good speed and sequencing when transitioning from sidelying to sitting  Transfers Overall transfer level: Needs assistance Equipment used: Rolling walker (2 wheeled) Transfers: Sit to/from Stand Sit to Stand: Min guard         General transfer comment: Pt able to perform sit to stand from low bed without assistance but is clearly labored while performing. Pt reports feeling lightheaded and dizzy in standing. Stands with crouched posture due  to generalized weakness. Vitals obtained and while BP does not change from sitting to standing HR increases from 103 bpm to 140 bpm.   Ambulation/Gait             General Gait Details: Deferred at this time for safety. Pt with elevated HR with position changes and presyncopal complaints. Overall appears very weak from poor po  fluid and calorie intake  Stairs            Wheelchair Mobility    Modified Rankin (Stroke Patients Only)       Balance Overall balance assessment: Needs assistance Sitting-balance support: No upper extremity supported Sitting balance-Leahy Scale: Good     Standing balance support: No upper extremity supported Standing balance-Leahy Scale: Fair Standing balance comment: Pt with bilateral LE weakness in standing with crouched posture. No overt buckling of LE but pt with poor tolerance to standing.                             Pertinent Vitals/Pain Pain Assessment: No/denies pain    Home Living Family/patient expects to be discharged to:: Private residence Living Arrangements: Alone Available Help at Discharge: Family;Available PRN/intermittently (Brother lives next door, has son and daughter) Type of Home: House Home Access: Stairs to enter Entrance Stairs-Rails: Can reach both Entrance Stairs-Number of Steps: 4 Home Layout: One level Home Equipment: Wheelchair - manual (No walker or cane, no shower chair or grab bars, no BSC)      Prior Function Level of Independence: Independent         Comments: Pt reports full independence with ADLs/IADLs. Chart indicates that family was assisting with bringing meals to patient     Hand Dominance   Dominant Hand: Left    Extremity/Trunk Assessment   Upper Extremity Assessment: Generalized weakness (Poor effort with MMT)           Lower Extremity Assessment: Generalized weakness (Poor effort with MMT)         Communication   Communication: No difficulties  Cognition Arousal/Alertness: Awake/alert Behavior During Therapy: WFL for tasks assessed/performed Overall Cognitive Status: Within Functional Limits for tasks assessed                      General Comments      Exercises        Assessment/Plan    PT Assessment Patient needs continued PT services  PT Diagnosis Difficulty  walking;Generalized weakness   PT Problem List Decreased strength;Decreased balance;Decreased mobility;Decreased safety awareness  PT Treatment Interventions DME instruction;Gait training;Stair training;Therapeutic activities;Functional mobility training;Therapeutic exercise;Balance training;Neuromuscular re-education;Cognitive remediation;Patient/family education   PT Goals (Current goals can be found in the Care Plan section) Acute Rehab PT Goals Patient Stated Goal: None provided by patient PT Goal Formulation: Patient unable to participate in goal setting    Frequency Min 2X/week   Barriers to discharge Decreased caregiver support Pt lives alone    Co-evaluation               End of Session Equipment Utilized During Treatment: Gait belt Activity Tolerance: Treatment limited secondary to medical complications (Comment) Patient left: in bed;with bed alarm set;with call bell/phone within reach Nurse Communication: Mobility status;Other (comment) (Signs of dehydration, unsafe to ambulate)    Functional Assessment Tool Used: clinical judgement Functional Limitation: Mobility: Walking and moving around Mobility: Walking and Moving Around Current Status ([id number]): At least 40 percent but less than 60 percent impaired,  limited or restricted Mobility: Walking and Moving Around Goal Status [phone]): At least 1 percent but less than 20 percent impaired, limited or restricted    Time: 0935-0950 PT Time Calculation (min) (ACUTE ONLY): 15 min   Charges:   PT Evaluation $PT Eval Low Complexity: 1 Procedure     PT G Codes:   PT G-Codes **NOT FOR INPATIENT CLASS** Functional Assessment Tool Used: clinical judgement Functional Limitation: Mobility: Walking and moving around Mobility: Walking and Moving Around Current Status ([id number]): At least 40 percent but less than 60 percent impaired, limited or restricted Mobility: Walking and Moving Around Goal Status [phone]): At least 1 percent but  less than 20 percent impaired, limited or restricted   Lynnea Maizes PT, DPT   Huprich,Jason 01/06/2016, 10:29 AM

## 2016-01-06 NOTE — Plan of Care (Signed)
Problem: Alteration in mood Goal: STG-Patient reports thoughts of self-harm to staff Outcome: Progressing Patient denies SI at this time.      

## 2016-01-06 NOTE — Progress Notes (Signed)
D: Patient appears very flat and depressed. He has not been ambulating due to weakness and has been using the walker. Patient went to CT with not issues. He denies SI/HI/AVH. Denies pain.  A: Medication given with education. Encouragement provided. Patient provided urinal. Bed alarm placed on patient.  R: Patient was compliant with medication. He has remained calm and cooperative. Safety maintained with 15 min checks.

## 2016-01-06 NOTE — Consult Note (Signed)
  Psychological Assessment   Name: Dillon SimmondsRobert [name] Age: [id number] Date of Evaluation: 01-05-16 Test(s) Administered: Dillon DienerBender Gestalt  Trail Making Test [employer]  Reason for Referral: Dillon [name] was referred for a psychological assessment by his physician, Dillon LineaJolanta Pucilowska, MD. He was admitted to Behavioral Medicine for the treatment of depressive symptoms. He was discharged from this unit about ten days ago. At that time his presentation was one of depression, anxiety and OCK-like symptoms. He did not keep any follow-up appointments or take prescribed medications. He has lost 16 lbs. in the last 10 days. He is not eating, has problems sleeping and feels that he would be better off dead. His wife died about a year ago and his condition began to deteriorate after her death. Please see the history and physical for additional background information. An assessment of cognitive functioning was requested.  Validity: Dillon [name] was pleasant and cooperative with the testing process. He attempted all tasks requested of him. He appeared not to put much effort into his performance although he stated he was doing the best he could. This is consistent with his reason for hospitalization, "I just don't care." The present evaluation is considered a valid indication of current functioning.  Trail Making Test - Dillon [name] completed Part A in 61 seconds. The expected time to completion is 27-39 seconds. Dillon [name]'s performance suggests moderate to severe cognitive impairment.  Dillon [name] did not complete Part B. He was unable to verbalize the task and made little effort to define his problem and continue. He merely stopped working when he was unsure how to proceed.  Bender Gestalt - Dillon [name] completed the task in less than 15 minutes. He obtained 4 errors: Simplification, Retrogression, Perseveration and Closure Difficulty. His performance suggests possible cognitive impairment   Diagnostic Impression: Unspecified  Neurocognitive Disorder

## 2016-01-06 NOTE — Progress Notes (Signed)
Patient with depressed affect, withdrawn behavior. Quiet speech, fair eye contact. Minimal interaction with peers. Verbalizes needs appropriately when Clinical research associatewriter initiate interaction. Ambulates with slow and steady gait to dayroom for lunch. Po fluids encouraged and drinks 1/2 bottle (x2 of shake). MD into assess and evaluate. PT into assess and evaluate. Denies SI/HI a this time. Remains fall request with alarm on.

## 2016-01-06 NOTE — Progress Notes (Signed)
[facility] MD Progress Note  01/06/2016 1:32 PM Dillon [name]  MRN:  [id number]  Subjective:  Ms. Bauernfeind still feels depressed, suicidal and despondent. He was assessed by medicine consultant yesterday to rule out any physical reasons for his weight loss and malaise. Medicine consult is greatly appreciated. Abdominal CT scan was positive for kidney and gallbladder stones as well as diverticulosis but no diverticulitis. The patient appears physically weak. He is drinking Ensure and ate a little bit of breakfast and lunch. We encourage fluid intake. Physical therapist was not able to exercise with the patient as he was orthostatic possibly due to dehydration. Medicine consultant did not find it necessary yesterday to offer IV fluids. At the patient accepts medications but feels that they are "too strong". He slept last night for the first time in 10 days. He does not participate in programming. Dr. Delaney Meigs in the emergency room started a conversation about ECT treatment but the patient refuses. We have his family coming today to discuss treatment options.  Principal Problem: Severe recurrent major depression without psychotic features (HCC) Diagnosis:   Patient Active Problem List   Diagnosis Date Noted  . Suicidal ideation [R45.851] 01/04/2016  . Severe recurrent major depression without psychotic features (HCC) [F33.2] 12/15/2015  . Grief [F43.21] 12/15/2015  . Weight loss [R63.4] 12/15/2015  . OCD (obsessive compulsive disorder) [F42.9] 12/15/2015  . Benign prostatic hyperplasia with urinary obstruction [N40.1] 11/24/2015  . 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  Past Medical History:  Past Medical History  Diagnosis Date  . Meningitis     spinal  . OCD (obsessive compulsive disorder)   . Kidney stones     Past Surgical History  Procedure Laterality Date  . Kidney stone surgery      Family History:  Family History  Problem Relation Age of Onset  . Cancer Mother     bladder  . Emphysema Father   . Emphysema Sister   . COPD Sister   . Cancer Brother     prostate   Family Psychiatric  History: See H&P. Social History:  History  Alcohol Use No     History  Drug Use No    Social History   Social History  . Marital Status: Widowed    Spouse Name: N/A  . Number of Children: N/A  . Years of Education: N/A   Social History Main Topics  . Smoking status: Never Smoker   . Smokeless tobacco: Never Used  . Alcohol Use: No  . Drug Use: No  . Sexual Activity: Not Currently   Other Topics Concern  . None   Social History Narrative   Additional Social History:    History of alcohol / drug use?: No history of alcohol / drug abuse                    Sleep: Fair  Appetite:  Poor  Current Medications: Current Facility-Administered Medications  Medication Dose Route Frequency Provider Last Rate Last Dose  . acetaminophen (TYLENOL) tablet 650 mg  650 mg Oral Q6H PRN [name], MD      . alum & mag hydroxide-simeth (MAALOX/MYLANTA) 200-200-20 MG/5ML suspension 30 mL  30 mL Oral Q4H PRN [name], MD      . feeding supplement (ENSURE ENLIVE) (ENSURE ENLIVE) liquid 237 mL  237 mL Oral TID BM Shari Prows, MD   237  mL at 01/06/16 1001  . fluvoxaMINE (LUVOX) tablet 100 mg  100 mg Oral QHS Shari Prows, MD   100 mg at 01/05/16 2201  . magnesium hydroxide (MILK OF MAGNESIA) suspension 30 mL  30 mL Oral Daily PRN [name], MD      . megestrol (MEGACE) 400 MG/10ML suspension 400 mg  400 mg Oral BID Shari Prows, MD   400 mg at 01/06/16 0956  . OLANZapine zydis (ZYPREXA) disintegrating tablet 5 mg  5 mg Oral QHS Evonda Enge B Aloni Chuang, MD   5 mg at 01/05/16 2201  . tamsulosin (FLOMAX) capsule 0.4 mg  0.4 mg Oral QPC supper [name], MD   0.4 mg at 01/05/16 1715  . traZODone (DESYREL) tablet 50 mg  50 mg Oral QHS  PRN [name], MD        Lab Results:  Results for orders placed or performed during the hospital encounter of 01/04/16 (from the past 48 hour(s))  Glucose, capillary     Status: Abnormal   Collection Time: 01/05/16  7:03 AM  Result Value Ref Range   Glucose-Capillary 117 (H) 65 - 99 mg/dL  Lipid panel     Status: None   Collection Time: 01/05/16  7:08 AM  Result Value Ref Range   Cholesterol 140 0 - 200 mg/dL   Triglycerides 90 <161 mg/dL   HDL 43 >09 mg/dL   Total CHOL/HDL Ratio 3.3 RATIO   VLDL 18 0 - 40 mg/dL   LDL Cholesterol 79 0 - 99 mg/dL    Comment:        Total Cholesterol/HDL:CHD Risk Coronary Heart Disease Risk Table                     Men   Women  1/2 Average Risk   3.4   3.3  Average Risk       5.0   4.4  2 X Average Risk   9.6   7.1  3 X Average Risk  23.4   11.0        Use the calculated Patient Ratio above and the CHD Risk Table to determine the patient's CHD Risk.        ATP III CLASSIFICATION (LDL):  <100     mg/dL   Optimal  604-540  mg/dL   Near or Above                    Optimal  130-159  mg/dL   Borderline  981-191  mg/dL   High  >478     mg/dL   Very High   Hemoglobin A1c     Status: None   Collection Time: 01/05/16  7:08 AM  Result Value Ref Range   Hgb A1c MFr Bld 5.0 4.0 - 6.0 %  TSH     Status: None   Collection Time: 01/05/16  7:08 AM  Result Value Ref Range   TSH 1.170 0.350 - 4.500 uIU/mL  Prolactin     Status: Abnormal   Collection Time: 01/05/16  7:08 AM  Result Value Ref Range   Prolactin 29.7 (H) 4.0 - 15.2 ng/mL    Comment: (NOTE) Performed At: [facility] [address] [place], [place] [id number] Mila Homer MD MV:[id number]   Vitamin B12     Status: None   Collection Time: 01/05/16  7:08 AM  Result Value Ref Range   Vitamin B-12 517 180 - 914 pg/mL  Comment: (NOTE) This assay is not validated for testing neonatal or myeloproliferative syndrome specimens for Vitamin B12 levels. Performed at  [facility]   Folate     Status: None   Collection Time: 01/05/16  7:08 AM  Result Value Ref Range   Folate 10.0 >5.9 ng/mL    Blood Alcohol level:  Lab Results  Component Value Date   ETH <5 01/04/2016   ETH <5 12/15/2015    Physical Findings: AIMS: Facial and Oral Movements Muscles of Facial Expression: None, normal Lips and Perioral Area: None, normal Jaw: None, normal Tongue: None, normal,Extremity Movements Upper (arms, wrists, hands, fingers): None, normal Lower (legs, knees, ankles, toes): None, normal, Trunk Movements Neck, shoulders, hips: None, normal, Overall Severity Severity of abnormal movements (highest score from questions above): None, normal Incapacitation due to abnormal movements: None, normal Patient's awareness of abnormal movements (rate only patient's report): No Awareness, Dental Status Current problems with teeth and/or dentures?: Yes Does patient usually wear dentures?: No  CIWA:    COWS:     Musculoskeletal: Strength & Muscle Tone: decreased Gait & Station: unsteady Patient leans: N/A  Psychiatric Specialty Exam: Review of Systems  Constitutional: Positive for weight loss and malaise/fatigue.  Gastrointestinal: Positive for abdominal pain.  Neurological: Positive for weakness.  Psychiatric/Behavioral: Positive for depression and suicidal ideas.  All other systems reviewed and are negative.   Blood pressure 106/75, pulse 78, temperature 97.7 F (36.5 C), temperature source Oral, resp. rate 16, height 5\' 9"  (1.753 m), weight 67.586 kg (149 lb), SpO2 100 %.Body mass index is 21.99 kg/(m^2).  General Appearance: Disheveled  Eye Contact::  Minimal  Speech:  Clear and Coherent  Volume:  Decreased  Mood:  Depressed, Hopeless and Worthless  Affect:  Blunt  Thought Process:  Goal Directed  Orientation:  Full (Time, Place, and Person)  Thought Content:  WDL  Suicidal Thoughts:  Yes.  with intent/plan  Homicidal Thoughts:  No  Memory:   Immediate;   Fair Recent;   Fair Remote;   Fair  Judgement:  Poor  Insight:  Lacking  Psychomotor Activity:  Decreased  Concentration:  Poor  Recall:  Poor  Fund of Knowledge:Fair  Language: Fair  Akathisia:  No  Handed:  Right  AIMS (if indicated):     Assets:  Communication Skills Desire for Improvement Financial Resources/Insurance Housing Resilience Social Support  ADL's:  Intact  Cognition: WNL  Sleep:  Number of Hours: 7   Treatment Plan Summary: Daily contact with patient to assess and evaluate symptoms and progress in treatment and Medication management   [name] is a 65 year old male with a history of anxiety and depression admitted for worsening of depression, malaise and severe weight loss in the context of recent major loss.  1. Suicidal ideation. The patient is able to contract for safety in the hospital.   2. Mood and anxiety. We restarted Luvox for depression and OCD type of symptoms. We added Zyprexa for augmentation.  3. Insomnia. He responded well to trazodone.   4. BPH. We continued Flomax.  5. Failure to thrive. We offered Megace and Ensure. Medicine input is greatly appreciated.  6. Cognitive decline. Psychological testing indicates unspecified neurocognitive disorder. Please see psychological consultation.  7. Metabolic syndrome monitoring. Lipid profile, TSH, and Hemoglobin A1c are normal. Prolactin 29.5.  8. R/O Vitamin B12 deficiency. Folic acid and vitamin B12 are normal.  9. Deconditioning. PT consult is appreciated.   10. Disposition. The patient was discharged home. He will  follow-up with [facility].   Kristine Linea, MD 01/06/2016, 1:32 PM

## 2016-01-06 NOTE — [facility] Group Notes (Signed)
[facility] Group Notes:  (Nursing/MHT/Case Management/Adjunct)  Date:  01/06/2016  Time:  2:15 PM  Type of Therapy:  Psychoeducational Skills  Participation Level:  Did Not Attend   Lynelle SmokeCara Travis [facility] 01/06/2016, 2:15 PM

## 2016-01-06 NOTE — Progress Notes (Signed)
Recreation Therapy Notes  INPATIENT RECREATION THERAPY ASSESSMENT  Patient Details Name: Dillon [name] MRN: [id number] DOB: [birth year] Today's Date: 01/06/2016  Patient Stressors: Other (Comment) (Worries about everything)  Coping Skills:   Isolate, Avoidance, Music, Other (Comment) (Meditate focus)  Personal Challenges: Concentration, Decision-Making, Problem-Solving, Self-Esteem/Confidence, Social Interaction, Stress Management, Time Management  Leisure Interests (2+):  Individual - TV, Individual - Other (Comment) (Spend time with family)  Awareness of Community Resources:  Yes  Community Resources:  Park  Current Use: No  If no, Barriers?: Other (Comment) (Hasn't thought about it)  Patient Strengths:  Honest, wants the best for everyone  Patient Identified Areas of Improvement:  "I'm good"  Current Recreation Participation:  Nothing  Patient Goal for Hospitalization:  "I don't set goals no more"  [place]idence[place]  County of Residence:  [place]   Current SI (including self-harm):  No  Current HI:  No  Consent to Intern Participation: N/A  Patient refused one-to-one treatment sessions. Due to patient's refusal, LRT will not develop a Recreational Therapy Care Plan. If patient's status changes, LRT will develop a Recreational Therapy Care Plan.  Jacquelynn CreeGreene,Charli Halle M, LRT/CTRS 01/06/2016, 3:55 PM

## 2016-01-07 LAB — CBC
HCT: 51.2 % (ref 40.0–52.0)
Hemoglobin: 18.4 g/dL — ABNORMAL HIGH (ref 13.0–18.0)
MCH: 31.7 pg (ref 26.0–34.0)
MCHC: 35.9 g/dL (ref 32.0–36.0)
MCV: 88.2 fL (ref 80.0–100.0)
PLATELETS: 153 10*3/uL (ref 150–440)
RBC: 5.81 MIL/uL (ref 4.40–5.90)
RDW: 13.2 % (ref 11.5–14.5)
WBC: 10.1 10*3/uL (ref 3.8–10.6)

## 2016-01-07 MED ORDER — OLANZAPINE 5 MG PO TBDP
10.0000 mg | ORAL_TABLET | Freq: Every day | ORAL | Status: DC
Start: 2016-01-07 — End: 2016-01-17
  Administered 2016-01-07 – 2016-01-16 (×10): 10 mg via ORAL
  Filled 2016-01-07 (×10): qty 2

## 2016-01-07 NOTE — Progress Notes (Signed)
Patient with depressed affect, quiet speech, withdrawn behavior in bed. Minimal interaction with peers. Ambulates with walker and slow and steady gait. Patient eats 100% of am meal. Takes am megace. No SI/HI/SH at this time. Social worker reports family meeting last night and family may obtain guardianship for for treatment. PO fluids encouraged and safety maintained.

## 2016-01-07 NOTE — Progress Notes (Signed)
[facility]  01/07/2016 1:29 PM Dillon [name]  MRN:  [id number]  Subjective:  Dillon [name] is a 65 year old male with a history of depression and obsessive-compulsive disorder. Since his wife passed away in May 2016, the patient has been increasingly depressed and unable to function.. This is his second hospitalization this month for psychotic depression with paranoia, extremely poor sleep, decreased appetite with 40 pound weight loss, suicidal ideation and inability to care for himself. Following his first hospitalization at the beginning of March, he has not taken any medications or follow our recommendation to obtain grief counseling, go to day program, and see mental health professionals. His family has been trying to take care of the patient but he refuses any help including food. The patient continues to refuse food or drink. He is utterly hopeless and helpless. He continues to be paranoid. He takes medications with much encouragement but shows no improvement so far. Psychological testing indicates mild cognitive impairment. The patient was consulted by medicine service. Any major physical problems were ruled out. We believe that the patient would benefit from ECT treatment but he lacks the capacity to consent. His family wants to step in. They would like to ask the court for temporary guardianship so the patient may obtain life saving ECT treatment.   Dillon [name] remains depressed, suicidal and despondent. He ate 10% of his lunch and dinner yesterday. He has been drinking half a can Ensure. His bottle of Gattorade has been untouched since yesterday. He has not been getting out of bed except for meals. There is no group participation. He is not conversational. I spoke again to Dillon [name] about ECT treatment but he does not show any understanding. I spoke with [name] to make him aware that this patient may need ECT is the family obtains temporary guardianship. The patient feels weak and fatigued.  There are no other somatic complaints  Principal Problem: Severe recurrent major depression without psychotic features (HCC) Diagnosis:   Patient Active Problem List   Diagnosis Date Noted  . Suicidal ideation [R45.851] 01/04/2016  . Severe recurrent major depression without psychotic features (HCC) [F33.2] 12/15/2015  . Grief [F43.21] 12/15/2015  . Weight loss [R63.4] 12/15/2015  . OCD (obsessive compulsive disorder) [F42.9] 12/15/2015  . Benign prostatic hyperplasia with urinary obstruction [N40.1] 11/24/2015  . 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 Meningitis     spinal  . OCD (obsessive compulsive disorder)   . Kidney stones     Past Surgical History  Procedure Laterality Date  . Kidney stone surgery     Family History:  Family History  Problem Relation Age of Onset  . Cancer Mother     bladder  . Emphysema Father   . Emphysema Sister   . COPD Sister   . Cancer Brother     prostate   Family Psychiatric  History: See H&P. Social History:  History  Alcohol Use No     History  Drug Use No    Social History   Social History  . Marital Status: Widowed    Spouse Name: N/A  . Number of Children: N/A  . Years of Education: N/A   Social History Main Topics  . Smoking status: Never Smoker   . Smokeless tobacco: Never Used  . Alcohol Use: No  . Drug  Use: No  . Sexual Activity: Not Currently   Other Topics Concern  . None   Social History Narrative   Additional Social History:    History of alcohol / drug use?: No history of alcohol / drug abuse                    Sleep: Poor  Appetite:  Poor  Current Medications: Current Facility-Administered Medications  Medication Dose Route Frequency Provider Last Rate Last Dose  . acetaminophen (TYLENOL) tablet 650 mg   650 mg Oral Q6H PRN [name], MD      . alum & mag hydroxide-simeth (MAALOX/MYLANTA) 200-200-20 MG/5ML suspension 30 mL  30 mL Oral Q4H PRN [name], MD      . feeding supplement (ENSURE ENLIVE) (ENSURE ENLIVE) liquid 237 mL  237 mL Oral TID BM Love Chowning B Gaetano Romberger, MD   50 mL at 01/07/16 0855  . fluvoxaMINE (LUVOX) tablet 100 mg  100 mg Oral QHS Neveah Bang B Jasper Hanf, MD   100 mg at 01/06/16 2200  . magnesium hydroxide (MILK OF MAGNESIA) suspension 30 mL  30 mL Oral Daily PRN [name], MD      . megestrol (MEGACE) 400 MG/10ML suspension 400 mg  400 mg Oral BID Shari Prows, MD   400 mg at 01/07/16 0855  . OLANZapine zydis (ZYPREXA) disintegrating tablet 5 mg  5 mg Oral QHS Alcides Nutting B Kasidi Shanker, MD   5 mg at 01/06/16 2100  . tamsulosin (FLOMAX) capsule 0.4 mg  0.4 mg Oral QPC supper [name], MD   0.4 mg at 01/06/16 1705  . traZODone (DESYREL) tablet 50 mg  50 mg Oral QHS PRN [name], MD        Lab Results:  Results for orders placed or performed during the hospital encounter of 01/04/16 (from the past 48 hour(s))  CBC     Status: Abnormal   Collection Time: 01/07/16  6:34 AM  Result Value Ref Range   WBC 10.1 3.8 - 10.6 K/uL   RBC 5.81 4.40 - 5.90 MIL/uL   Hemoglobin 18.4 (H) 13.0 - 18.0 g/dL    Comment: RESULT REPEATED AND VERIFIED   HCT 51.2 40.0 - 52.0 %   MCV 88.2 80.0 - 100.0 fL   MCH 31.7 26.0 - 34.0 pg   MCHC 35.9 32.0 - 36.0 g/dL   RDW 16.1 09.6 - 04.5 %   Platelets 153 150 - 440 K/uL    Blood Alcohol level:  Lab Results  Component Value Date   ETH <5 01/04/2016   ETH <5 12/15/2015    Physical Findings: AIMS: Facial and Oral Movements Muscles of Facial Expression: None, normal Lips and Perioral Area: None, normal Jaw: None, normal Tongue: None, normal,Extremity Movements Upper (arms, wrists, hands, fingers): None, normal Lower (legs, knees, ankles, toes): None, normal, Trunk Movements Neck, shoulders, hips: None, normal,  Overall Severity Severity of abnormal movements (highest score from questions above): None, normal Incapacitation due to abnormal movements: None, normal Patient's awareness of abnormal movements (rate only patient's report): No Awareness, Dental Status Current problems with teeth and/or dentures?: Yes Does patient usually wear dentures?: No  CIWA:    COWS:     Musculoskeletal: Strength & Muscle Tone: within normal limits Gait & Station: normal Patient leans: N/A  Psychiatric Specialty Exam: Review of Systems  Constitutional: Positive for malaise/fatigue.  Neurological: Positive for weakness.  Psychiatric/Behavioral: Positive for depression and suicidal ideas.  All other systems  reviewed and are negative.   Blood pressure 112/83, pulse 120, temperature 98.5 F (36.9 C), temperature source Oral, resp. rate 18, height  (1.753 m), weight 67.586 kg (149 lb), SpO2 100 %.Body mass index is 21.99 kg/(m^2).  General Appearance: Disheveled  Eye Contact::  Minimal  Speech:  Slow and Paucity of speech.  Volume:  Normal  Mood:  Depressed, Hopeless and Worthless  Affect:  Blunt  Thought Process:  Poverty of speech.  Orientation:  Other:  To person and place only.  Thought Content:  Delusions and Paranoid Ideation  Suicidal Thoughts:  Yes.  with intent/plan  Homicidal Thoughts:  No  Memory:  Immediate;   Poor Recent;   Poor Remote;   Poor  Judgement:  Poor  Insight:  Lacking  Psychomotor Activity:  Decreased  Concentration:  Poor  Recall:  Poor  Fund of Knowledge:Poor  Language: Poor  Akathisia:  No  Handed:  Right  AIMS (if indicated):     Assets:  Communication Skills Desire for Improvement Financial Resources/Insurance Housing Physical Health Resilience Social Support  ADL's:  Intact  Cognition: WNL  Sleep:  Number of Hours: 8.5   Treatment Plan Summary: Daily contact with patient to assess and evaluate symptoms and progress in treatment and Medication management    Dillon [name] is a 65 year old male with a history of anxiety and depression admitted for worsening of depression, malaise and severe weight loss in the context of recent major loss.  1. Suicidal ideation. The patient is still suicidal but unable to plan or execute any action. He is on 15 min checks.    2. Mood/anxietyand psychosis. We restarted Luvox for depression and OCD and started Zyprexa for psychosis. I will increase Zyprexa to 10 mg nightly.  3. Insomnia. He responded well to trazodone.   4. BPH. We continued Flomax.  5. Failure to thrive. We offered Megace and Ensure. Medicine input is greatly appreciated. CT scan was unremarkable.  6. Cognitive decline. Psychological testing indicates unspecified neurocognitive disorder. Please see psychological consultation.  7. Metabolic syndrome monitoring. Lipid profile, TSH, and Hemoglobin A1c are normal. Prolactin 29.5.  8. R/O Vitamin B12 deficiency. Folic acid and vitamin B12 are normal.  9. Deconditioning. PT consult is appreciated.   10. ECT. The patient does not have capacity to consent. The family is in the process of obtaining temporary guardianship. Dr. Toni Amend is aware of this patient's needs.  11. Disposition. The patient will be discharged with family. He will follow-up with [facility].    Kristine Linea, MD 01/07/2016, 1:29 PM

## 2016-01-07 NOTE — [facility] Group Notes (Signed)
[facility] LCSW Group Therapy   01/07/2016 11am  Type of Therapy: Group Therapy   Participation Level: Did Not Attend. Patient invited to participate but declined.    Swayze Kozuch F. Emari Demmer, MSW, LCSWA, LCAS   

## 2016-01-07 NOTE — Progress Notes (Signed)
D: Pt denies SI/HI/AVH. Pt is pleasant and cooperative, affect is flat and sad, isolates to the room. Pt stayed in the room throughout the shift, he appears less anxious and he is not interacting with peers and staff.   A: Pt was offered support and encouragement. Pt was offered scheduled medications. Pt was encouraged to attend groups. Q 15 minute checks were done for safety.  R: Pt did not attend group. Pt refused medication. Pt has no complaints.Pt receptive to treatment and safety maintained on unit.

## 2016-01-07 NOTE — Progress Notes (Signed)
Patient ID: Dillon PriestRobert H [name], male   DOB: [birth year], 65 y.o.   MRN: [id number]  CSW facilitaed family meeting with pt's daughter and son. They expressed fear of their father passing away due to not eating and drinking. They report he paces constantly at home, refused to eat and drink and expresses paranoid thoughts. CSW discussed guardianship and the process of obtaining it. The family is supportive of guardianship and the psychiatrist recommended treatment. CSW will continue to follow up with family and psychiatrist.   Dillon [name] MSW, [facility]WA  01/07/2016 3:10 PM
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
## 2016-01-07 NOTE — Progress Notes (Signed)
[facility] - [place] at [facility]   PATIENT NAME: Dillon [name]    MR#:  [id number]  DATE OF BIRTH:  [birth year]  SUBJECTIVE:  CHIEF COMPLAINT:  For weight loss.  REVIEW OF SYSTEMS:  CONSTITUTIONAL: No fever, fatigue or weakness. Have weight loss in last few months EYES: No blurred or double vision.  EARS, NOSE, AND THROAT: No tinnitus or ear pain.  RESPIRATORY: No cough, shortness of breath, wheezing or hemoptysis.  CARDIOVASCULAR: No chest pain, orthopnea, edema.  GASTROINTESTINAL: No nausea, vomiting, diarrhea or abdominal pain.  GENITOURINARY: No dysuria, hematuria.  ENDOCRINE: No polyuria, nocturia,  HEMATOLOGY: No anemia, easy bruising or bleeding SKIN: No rash or lesion. MUSCULOSKELETAL: No joint pain or arthritis.   NEUROLOGIC: No tingling, numbness, weakness.  PSYCHIATRY: No anxiety , positive for depression.  Not eating good for last few months.  ROS  DRUG ALLERGIES:   Allergies  Allergen Reactions  . Codeine Other (See Comments)    Unsure of reaction    VITALS:  Blood pressure 112/83, pulse 120, temperature 98.5 F (36.9 C), temperature source Oral, resp. rate 18, height  (1.753 m), weight 67.586 kg (149 lb), SpO2 100 %.  PHYSICAL EXAMINATION:  GENERAL:  65 y.o.-year-old patient lying in the bed with no acute distress.  EYES: Pupils equal, round, reactive to light and accommodation. No scleral icterus. Extraocular muscles intact.  HEENT: Head atraumatic, normocephalic. Oropharynx and nasopharynx clear.  NECK:  Supple, no jugular venous distention. No thyroid enlargement, no tenderness.  LUNGS: Normal breath sounds bilaterally, no wheezing, rales,rhonchi or crepitation. No use of accessory muscles of respiration.  CARDIOVASCULAR: S1, S2 normal. No murmurs, rubs, or gallops.  ABDOMEN: Soft, nontender, nondistended. Bowel sounds present. No organomegaly or mass.  EXTREMITIES: No pedal edema, cyanosis, or clubbing.  NEUROLOGIC:  Cranial nerves II through XII are intact. Muscle strength 5/5 in all extremities. Sensation intact. Gait not checked.  PSYCHIATRIC: The patient is alert and oriented x 3.  SKIN: No obvious rash, lesion, or ulcer. Appears very dry.  Physical Exam LABORATORY PANEL:   CBC  Recent Labs Lab 01/07/16 0634  WBC 10.1  HGB 18.4*  HCT 51.2  PLT 153   ------------------------------------------------------------------------------------------------------------------  Chemistries   Recent Labs Lab 01/04/16 1658  NA 136  K 3.6  CL 107  CO2 20*  GLUCOSE 92  BUN 25*  CREATININE 1.06  CALCIUM 10.2  AST 38  ALT 44  ALKPHOS 78  BILITOT 2.0*   ------------------------------------------------------------------------------------------------------------------  Cardiac Enzymes No results for input(s): TROPONINI in the last 168 hours. ------------------------------------------------------------------------------------------------------------------  RADIOLOGY:  Ct Abdomen Pelvis W Contrast  01/05/2016  CLINICAL DATA:  Weight loss and loss of appetite over the past 2 weeks. Generalized abdominal discomfort. EXAM: CT ABDOMEN AND PELVIS WITH CONTRAST TECHNIQUE: Multidetector CT imaging of the abdomen and pelvis was performed using the standard protocol following bolus administration of intravenous contrast. CONTRAST:  75mL OMNIPAQUE IOHEXOL 300 MG/ML  SOLN COMPARISON:  None available. FINDINGS: Lower chest: The lung bases are clear of acute process. No pleural effusion or pulmonary lesions. The heart is normal in size. No pericardial effusion. The distal esophagus and aorta are unremarkable. Hepatobiliary: Small low-attenuation lesion in segment 5 is likely a benign cyst. No worrisome hepatic lesions or intrahepatic biliary dilatation. The gallbladder demonstrates gallstones but no acute inflammation. No common bile duct dilatation. Pancreas: No mass, inflammation or ductal dilatation. Spleen: Normal  size.  No focal lesions. Adrenals/Urinary Tract: The adrenal glands are normal. The right  kidney is small with cortical thinning and scarring. No ureteral dilatation. There is a small calcification noted at the UVJ which could be a non obstructing calculus. The left kidney is normal.  Small cysts are noted. Stomach/Bowel: The stomach, duodenum, small bowel and colon are unremarkable. No inflammatory changes, mass lesions or obstructive findings. Descending and Sigmoid diverticulosis without findings for acute diverticulitis. The terminal ileum is normal. The appendix is normal. Vascular/Lymphatic: The aorta is normal in caliber. Minimal scattered atherosclerotic calcifications. The branch vessels are patent. The major venous structures are patent. No mesenteric or retroperitoneal mass or adenopathy. Other: The bladder, prostate gland and seminal vesicles are unremarkable. Mild prostate gland enlargement. Musculoskeletal: No significant bony findings. IMPRESSION: 1. Small calculus or calculi in the distal right ureter/UVJ without obstructive findings. 2. Small right kidney. 3. Cholelithiasis. 4. Descending and sigmoid diverticulosis without findings for acute diverticulitis. Electronically Signed   By: Rudie MeyerP.  Gallerani M.D.   On: 01/05/2016 22:01    ASSESSMENT AND PLAN:   Principal Problem:   Severe recurrent major depression without psychotic features (HCC) Active Problems:   Benign prostatic hyperplasia with urinary obstruction   Grief   Weight loss   OCD (obsessive compulsive disorder)   Suicidal ideation  #1 Anorexia with weight loss-could be related to underlying depression. Patient denies any other symptoms that would point towards a particular organ system. -Due to abdominal discomfort and constipation, ordered a CT of abdomen which showed nonobstructing distal ureter stone, gallbladder stone and presence of diverticuli but no diverticulitis. Continue Megace. -Encouraged intake of more fluids by  mouth. Agree with adding dietary supplements like ensure. - further workup can be done as outpatient - TSH within normal limits - need screening colonoscopy as outpatient, not urgent.  #2 major depression and OCD- not suicidal - mgmt per psych. - on Luvox, Trazodone and zyprexa Had tried remeron in the past without much use.  #3 BPH- continue Flomax  #4 DVT Prophylaxis- patient is ambulatory  # 5 high hemoglobin   This could be secondary to his severe dehydration.   I encourage to check it after a few days of proper nutrition.   His hemoglobin in the past was in normal range.   If after few days off code nutrition and oral hydration we find hemoglobin is still more than  17, he may need hematology consult, medical consult might not be that much helpful in that situation.   All the records are reviewed and case discussed with Care Management/Social Workerr. Management plans discussed with the patient, family and they are in agreement.  CODE STATUS: Full.  TOTAL TIME TAKING CARE OF THIS PATIENT: 35 minutes.   As there are no active medical issues, I will sign off currently. Please call us if further need arise.  POSSIBLE D/C IN ? DAYS, DEPENDING ON CLINICAL CONDITION.   Altamese DillingVACHHANI, Seiji Wiswell M.D on 01/07/2016   Between 7am to 6pm - Pager - [phone]  After 6pm go to [email or web address] - password [email or web address]  [facility]io [facility]  Office  [phone]  CC: Primary care physician; Gabriel Cirriheryl Wicker, NP  Note: This dictation was prepared with Dragon dictation along with smaller phrase technology. Any transcriptional errors that result from this process are unintentional.

## 2016-01-07 NOTE — Plan of Care (Signed)
Problem: Consults Goal: Depression Patient Education See Patient Education Module for education specifics.  Outcome: Not Progressing Remains depressed, encouragement and monitoring needed for po fluids, meals, and nutritional shakes.

## 2016-01-07 NOTE — Progress Notes (Signed)
[facility]  01/07/2016 10:53 PM Dillon [name]  MRN:  [id number]  Subjective:    Dillon [name] is a 65 year old male with a history of depression and obsessive-compulsive disorder. Since his wife passed away in May 2016, the patient has been increasingly depressed and unable to function.. This is his second hospitalization this month for psychotic depression with paranoia, extremely poor sleep, decreased appetite with 40 pound weight loss, suicidal ideation and inability to care for himself. Following his first hospitalization at the beginning of March, he has not taken any medications or follow our recommendation to obtain grief counseling, go to day program, and see mental health professionals. His family has been trying to take care of the patient but he refuses any help including food. The patient continues to refuse food or drink. He is utterly hopeless and helpless. He continues to be paranoid. He takes medications with much encouragement but shows no improvement so far. Psychological testing indicates mild cognitive impairment. The patient was consulted by medicine service. Any major physical problems were ruled out. We believe that the patient would benefit from ECT treatment but he lacks the capacity to consent. His family wants to step in. They would like to ask the court for temporary guardianship so the patient may obtain life saving ECT treatment. We restarted Luvox for depression OCD and Zyprexa for psychosis.  Dillon [name] is still very depressed, hopeless, and despondent. He is likely psychotic but denies any auditory or visual hallucinations or paranoia. He stays in bed most of the time. He hardly moves during the interview. He leaves his room for meals only but his appetite remains poor. He eats with much encouragement only. His fluid intake is also poor even though we tried to encourage fluids. He almost finished his doctor or a bottle that he's been drinking since yesterday. He feels  apathetic hopeless and he doesn't care whether he lives or dies. We recommended ECT treatment but the patient refuses. [name] will talk to him again today. He accepts medication without enthusiasm. There are no somatic complaints except for feeling weak and tired.  Principal Problem: Severe recurrent major depression without psychotic features (HCC) Diagnosis:   Patient Active Problem List   Diagnosis Date Noted  . Suicidal ideation [R45.851] 01/04/2016  . Severe recurrent major depression without psychotic features (HCC) [F33.2] 12/15/2015  . Grief [F43.21] 12/15/2015  . Weight loss [R63.4] 12/15/2015  . OCD (obsessive compulsive disorder) [F42.9] 12/15/2015  . Benign prostatic hyperplasia with urinary obstruction [N40.1] 11/24/2015  . 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  Past Medical History:  Past Medical History  Diagnosis Date  . Meningitis     spinal  . OCD (obsessive compulsive disorder)   . Kidney stones     Past Surgical History  Procedure Laterality Date  . Kidney stone surgery     Family History:  Family History  Problem Relation Age of Onset  . Cancer Mother     bladder  . Emphysema Father   . Emphysema Sister   . COPD Sister   . Cancer Brother     prostate   Family Psychiatric  History: See H&P. Social History:  History  Alcohol Use No     History  Drug Use No    Social History   Social History  . Marital Status: Widowed    Spouse Name: N/A  . Number of  Children: N/A  . Years of Education: N/A   Social History Main Topics  . Smoking status: Never Smoker   . Smokeless tobacco: Never Used  . Alcohol Use: No  . Drug Use: No  . Sexual Activity: Not Currently   Other Topics Concern  . None   Social History Narrative   Additional Social History:    History of alcohol / drug use?: No history of alcohol / drug abuse                     Sleep: Poor  Appetite:  Poor  Current Medications: Current Facility-Administered Medications  Medication Dose Route Frequency Provider Last Rate Last Dose  . acetaminophen (TYLENOL) tablet 650 mg  650 mg Oral Q6H PRN [name], MD      . alum & mag hydroxide-simeth (MAALOX/MYLANTA) 200-200-20 MG/5ML suspension 30 mL  30 mL Oral Q4H PRN [name], MD      . feeding supplement (ENSURE ENLIVE) (ENSURE ENLIVE) liquid 237 mL  237 mL Oral TID BM Karla Pavone B Salvador Bigbee, MD   237 mL at 01/07/16 1959  . fluvoxaMINE (LUVOX) tablet 100 mg  100 mg Oral QHS Shari Prows, MD   100 mg at 01/07/16 2205  . magnesium hydroxide (MILK OF MAGNESIA) suspension 30 mL  30 mL Oral Daily PRN [name], MD   30 mL at 01/07/16 1458  . megestrol (MEGACE) 400 MG/10ML suspension 400 mg  400 mg Oral BID Shari Prows, MD   400 mg at 01/07/16 2205  . OLANZapine zydis (ZYPREXA) disintegrating tablet 10 mg  10 mg Oral QHS Shari Prows, MD   10 mg at 01/07/16 2205  . tamsulosin (FLOMAX) capsule 0.4 mg  0.4 mg Oral QPC supper [name], MD   0.4 mg at 01/07/16 1650  . traZODone (DESYREL) tablet 50 mg  50 mg Oral QHS PRN [name], MD        Lab Results:  Results for orders placed or performed during the hospital encounter of 01/04/16 (from the past 48 hour(s))  CBC     Status: Abnormal   Collection Time: 01/07/16  6:34 AM  Result Value Ref Range   WBC 10.1 3.8 - 10.6 K/uL   RBC 5.81 4.40 - 5.90 MIL/uL   Hemoglobin 18.4 (H) 13.0 - 18.0 g/dL    Comment: RESULT REPEATED AND VERIFIED   HCT 51.2 40.0 - 52.0 %   MCV 88.2 80.0 - 100.0 fL   MCH 31.7 26.0 - 34.0 pg   MCHC 35.9 32.0 - 36.0 g/dL   RDW 16.1 09.6 - 04.5 %   Platelets 153 150 - 440 K/uL    Blood Alcohol level:  Lab Results  Component Value Date   ETH <5 01/04/2016   ETH <5 12/15/2015    Physical Findings: AIMS: Facial and Oral Movements Muscles of Facial Expression: None, normal Lips and  Perioral Area: None, normal Jaw: None, normal Tongue: None, normal,Extremity Movements Upper (arms, wrists, hands, fingers): None, normal Lower (legs, knees, ankles, toes): None, normal, Trunk Movements Neck, shoulders, hips: None, normal, Overall Severity Severity of abnormal movements (highest score from questions above): None, normal Incapacitation due to abnormal movements: None, normal Patient's awareness of abnormal movements (rate only patient's report): No Awareness, Dental Status Current problems with teeth and/or dentures?: Yes Does patient usually wear dentures?: No  CIWA:    COWS:     Musculoskeletal: Strength & Muscle Tone: decreased  Gait & Station: unsteady Patient leans: N/A  Psychiatric Specialty Exam: Review of Systems  Constitutional: Positive for weight loss.  Neurological: Positive for weakness.  Psychiatric/Behavioral: Positive for depression and suicidal ideas.  All other systems reviewed and are negative.   Blood pressure 125/78, pulse 115, temperature 98.2 F (36.8 C), temperature source Oral, resp. rate 20, height  (1.753 m), weight 67.586 kg (149 lb), SpO2 100 %.Body mass index is 21.99 kg/(m^2).  General Appearance: Fairly Groomed  Patent attorney::  Minimal  Speech:  Garbled  Volume:  Decreased  Mood:  Anxious and Depressed  Affect:  Blunt  Thought Process:  Goal Directed  Orientation:  Full (Time, Place, and Person)  Thought Content:  Delusions and Paranoid Ideation  Suicidal Thoughts:  Yes.  with intent/plan  Homicidal Thoughts:  No  Memory:  Immediate;   Fair Recent;   Fair Remote;   Fair  Judgement:  Impaired  Insight:  Lacking  Psychomotor Activity:  Decreased  Concentration:  Fair  Recall:  [employer] of Knowledge:Fair  Language: Fair  Akathisia:  No  Handed:  Right  AIMS (if indicated):     Assets:  Architect Housing Physical Health Resilience Social Support  ADL's:  Intact   Cognition: WNL  Sleep:  Number of Hours: 8.5   Treatment Plan Summary: Daily contact with patient to assess and evaluate symptoms and progress in treatment and Medication management   Mr. Kantner is a 65 year old male with a history of anxiety and depression admitted for worsening of depression, malaise and severe weight loss in the context of recent major loss.  1. Suicidal ideation. The patient is still suicidal but unable to plan or execute any action. He is on 15 min checks.    2. Mood/anxietyand psychosis. We restarted Luvox for depression and OCD and started Zyprexa for psychosis.   3. Insomnia. He responded well to trazodone.   4. BPH. We continued Flomax.  5. Failure to thrive. We offered Megace and Ensure. Medicine input is greatly appreciated. Abdomen CT scan was unremarkable.  6. Cognitive decline. Psychological testing indicates unspecified neurocognitive disorder. Please see psychological consultation.  7. Metabolic syndrome monitoring. Lipid profile, TSH, and Hemoglobin A1c are normal. Prolactin 29.5.  8. R/O Vitamin B12 deficiency. Folic acid and vitamin B12 are normal.  9. Deconditioning. PT consult is appreciated.   10. ECT. The patient does not have capacity to consent. The family is in the process of obtaining temporary guardianship. Dr. Toni Amend is aware of this patient's needs.  11. Disposition. The patient will be discharged to home with family. He will follow-up with [facility].     Kristine Linea, MD 01/07/2016, 10:53 PM

## 2016-01-07 NOTE — Plan of Care (Signed)
Problem: Alteration in mood Goal: LTG-Patient reports reduction in suicidal thoughts (Patient reports reduction in suicidal thoughts and is able to verbalize a safety plan for whenever patient is feeling suicidal)  Outcome: Progressing Patient denies SI.      

## 2016-01-07 NOTE — Progress Notes (Signed)
Recreation Therapy Notes  Date: 03.23.17 Time: 3:00 pm Location: Craft Room  Group Topic: Leisure Education  Goal Area(s) Addresses:  Patient will identify activities for each letter of the alphabet. Patient will verbalize ability to integrate positive leisure into life post d/c. Patient will verbalize ability to use leisure as a Associate Professorcoping skill.  Behavioral Response: Did not attend  Intervention: Leisure Alphabet  Activity: Patients were given a Leisure Information systems managerAlphabet worksheet and instructed to write a healthy leisure activity for each letter of the alphabet.  Education: LRT educated patients on what they need to participate in leisure.  Education Outcome: Patient did not attend group.  Clinical Observations/Feedback: Patient did not attend group.  Jacquelynn CreeGreene,Daxten Kovalenko M, LRT/CTRS 01/07/2016 4:14 PM

## 2016-01-07 NOTE — Progress Notes (Signed)
Patient reports feeling tired prior to dinner. Patient supervised on way to dining room and after dinner. Patient appetite slightly improved. Toileted and returns to bed. Safety maintained.

## 2016-01-07 NOTE — [facility] Group Notes (Signed)
[facility] Group Notes:  (Nursing/MHT/Case Management/Adjunct)  Date:  01/07/2016  Time:  1:51 PM  Type of Therapy:  Group Therapy  Participation Level:  Did Not Attend  Dillon [name] De'Chelle Latesia Norrington 01/07/2016, 1:51 PM

## 2016-01-07 NOTE — Tx Team (Signed)
Interdisciplinary Treatment Plan Update (Adult)  Date:  01/07/2016 Time Reviewed:  3:10 PM  Progress in Treatment: Attending groups: No. Participating in groups:  No. Taking medication as prescribed:  Yes. Tolerating medication:  Yes. Family/Significant othe contact made:  Yes, individual(s) contacted:  daughter and son  Patient understands diagnosis:  No. and As evidenced by:  limited insight  Discussing patient identified problems/goals with staff:  Yes. Medical problems stabilized or resolved:  Yes. Denies suicidal/homicidal ideation: Yes. Issues/concerns per patient self-inventory:  Yes. Other:  New problem(s) identified: No, Describe:  NA  Discharge Plan or Barriers: Pt plans to return home and follow up with outpatient.    Reason for Continuation of Hospitalization: Anxiety Depression Medication stabilization Suicidal ideation Other; describe Paranoia, Refusing to eat and drink  Comments: [name] is a 65 year old male with a history of depression and obsessive-compulsive disorder. Since his wife passed away in 04-07-15, the patient has been increasingly depressed and unable to function.. This is his second hospitalization this month for psychotic depression with paranoia, extremely poor sleep, decreased appetite with 40 pound weight loss, suicidal ideation and inability to care for himself. Following his first hospitalization at the beginning of March, he has not taken any medications or follow our recommendation to obtain grief counseling, go to day program, and see mental health professionals. His family has been trying to take care of the patient but he refuses any help including food. The patient continues to refuse food or drink. He is utterly hopeless and helpless. He continues to be paranoid. He takes medications with much encouragement but shows no improvement so far. Psychological testing indicates mild cognitive impairment. The patient was consulted by medicine service.  Any major physical problems were ruled out. We believe that the patient would benefit from ECT treatment but he lacks the capacity to consent. His family wants to step in. They would like to ask the court for temporary guardianship so the patient may obtain life saving ECT treatment.  [name] remains depressed, suicidal and despondent. He ate 10% of his lunch and dinner yesterday. He has been drinking half a can Ensure. His bottle of Gattorade has been untouched since yesterday. He has not been getting out of bed except for meals. There is no group participation. He is not conversational. I spoke again to Mr. Dolinger about ECT treatment but he does not show any understanding. I spoke with Dr. Weber Cooks to make him aware that this patient may need ECT is the family obtains temporary guardianship. The patient feels weak and fatigued. There are no other somatic complaints   Estimated length of stay: 7 days   New goal(s): NA  Review of initial/current patient goals per problem list:   1.  Goal(s): Patient will participate in aftercare plan * Met:  * Target date: at discharge * As evidenced by: Patient will participate within aftercare plan AEB aftercare provider and housing plan at discharge being identified.   2.  Goal (s): Patient will exhibit decreased depressive symptoms and suicidal ideations. * Met:  *  Target date: at discharge * As evidenced by: Patient will utilize self rating of depression at 3 or below and demonstrate decreased signs of depression or be deemed stable for discharge by MD.   3.  Goal(s): Patient will demonstrate decreased signs and symptoms of anxiety. * Met:  * Target date: at discharge * As evidenced by: Patient will utilize self rating of anxiety at 3 or below and demonstrated decreased signs of  anxiety, or be deemed stable for discharge by MD  4.  Goal (s): Patient will demonstrate decreased symptoms of psychosis. * Met: No  *  Target date: at discharge * As  evidenced by: Patient will not endorse signs of psychosis or be deemed stable for discharge by MD.   Attendees: Patient:  Dillon [name] 3/23/20173:10 PM  Family:   3/23/20173:10 PM  Physician:   Dr. Bary Leriche  3/23/20173:10 PM  Nursing:   Carolynn Sayers, RN  3/23/20173:10 PM  Case Manager:   3/23/20173:10 PM  Counselor:   3/23/20173:10 PM  Other:  [place]  3/23/20173:10 PM  Other:  Everitt Amber, [place]  3/23/20173:10 PM  Other:   3/23/20173:10 PM  Other:  3/23/20173:10 PM  Other:  3/23/20173:10 PM  Other:  3/23/20173:10 PM  Other:  3/23/20173:10 PM  Other:  3/23/20173:10 PM  Other:  3/23/20173:10 PM  Other:   3/23/20173:10 PM   Scribe for Treatment Team:   Wray Kearns, MSW, [place]  01/07/2016, 3:10 PM

## 2016-01-08 NOTE — [facility] Group Notes (Signed)
[facility] LCSW Group Therapy   01/08/2016 1pm  Type of Therapy: Group Therapy   Participation Level: Did Not Attend. Patient invited to participate but declined.    Deronte Solis F. Marquarius Lofton, MSW, LCSWA, LCAS   

## 2016-01-08 NOTE — Consult Note (Signed)
  ECT: ECT consult for this 65 year old man with severe major depression. Patient interviewed. Chart reviewed. Spoke with Dr. Demetrius CharityP and Child psychotherapistsocial worker. I had been told that the patient was not in favor of ECT but that the family had expressed interest.  Full history elsewhere. 65 year old man with severe major depression possibly with some psychotic features with active suicidal ideation poor self-care melancholic features. In these termsA good candidate for ECT.  Spoke with the patient today and presented him briefly with a recommendation about electroconvulsive therapy. Asked him if he would be willing to consider treatment. His response was that he didn't know if he would still need it in a week but that if he did he would be willing to at least consider it.  I will be away from the hospital all of next week and will not be coming back until Monday, April 3. The ECT staff are also going to be out of the office all of next week. The earliest we would be able to potentially start ECT would be Wednesday, April 5.  I have discussed the case with the ECT staff. I will follow-up with the patient as soon as I am back from vacation to see whether treatment would still be needed and appropriate and reassess his willingness at that point.

## 2016-01-08 NOTE — Progress Notes (Signed)
D: Patient still appears sad and isolates to room. He denies SI/HI/AVH at this time. He denies pain. He does not attend groups. When encouraged to go and be around peers and he refused.  A: Medication was given with education. Encouragement was provided. Patient was provided a water pitcher to help with dehydration.  R: Patient was compliant with medication. She has remained calm and cooperative. Safety maintained with 15 min checks.

## 2016-01-08 NOTE — [facility] Group Notes (Signed)
[facility] LCSW Aftercare Discharge Planning Group Note  01/08/2016 9:30 AM  Participation Quality: Did Not Attend. Patient invited to participate but declined.   Tunisia Landgrebe F. Keyah Blizard, MSW, LCSWA, LCAS   

## 2016-01-08 NOTE — [facility] Group Notes (Signed)
[facility] Group Notes:  (Nursing/MHT/Case Management/Adjunct)  Date:  01/08/2016  Time:  11:50 AM  Type of Therapy:  Movement Therapy  Participation Level:  Did Not Attend  Dillon [name] 01/08/2016, 11:50 AM

## 2016-01-08 NOTE — Progress Notes (Signed)
D:  Per pt self inventory pt reports sleeping fair, appetite poor, energy level low, ability to pay attention poor, rates depression at a 3 out of 10, hopelessness at a 4 out of 10, anxiety at a 4 out of 10, denies SI/HI/AVH, goal today: "drink plenty of fluids and ensure"     A:  Emotional support provided, Encouraged pt to continue with treatment plan and attend all group activities, q15 min checks maintained for safety.  R:  Pt is not going to groups, pleasant and otherwise cooperative with staff and other patients on the unit, isolates in room.

## 2016-01-09 DIAGNOSIS — R45851 Suicidal ideations: Secondary | ICD-10-CM

## 2016-01-09 DIAGNOSIS — F332 Major depressive disorder, recurrent severe without psychotic features: Principal | ICD-10-CM

## 2016-01-09 NOTE — Plan of Care (Signed)
Problem: Alteration in mood Goal: LTG-Pt's behavior demonstrates decreased signs of depression (Patient's behavior demonstrates decreased signs of depression to the point the patient is safe to return home and continue treatment in an outpatient setting)  Outcome: Not Progressing Pt affect is still depressed. pt is isolative to room. Pt nutritional intake is poor.

## 2016-01-09 NOTE — [facility] Suicide Risk Assessment (Signed)
[facility] INPATIENT:  Family/Significant Other Suicide Prevention Education  Suicide Prevention Education:  Education Completed; Dillon FoyKelly [name] at ph: [phone]; has been identified by the patient as the family member/significant other with whom the patient will be residing, and identified as the person(s) who will aid the patient in the event of a mental health crisis (suicidal ideations/suicide attempt).  With written consent from the patient, the family member/significant other has been provided the following suicide prevention education, prior to the and/or following the discharge of the patient.  The suicide prevention education provided includes the following:  Suicide risk factors  Suicide prevention and interventions  National Suicide Hotline telephone number  [facility] assessment telephone number  [facility] Assistance 911  [facility] and/or [facility] Mobile [facility] telephone number  Request made of family/significant other to:  Remove weapons (e.g., guns, rifles, knives), all items previously/currently identified as safety concern.    Remove drugs/medications (over-the-counter, prescriptions, illicit drugs), all items previously/currently identified as a safety concern.  The family member/significant other verbalizes understanding of the suicide prevention education information provided.  The family member/significant other agrees to remove the items of safety concern listed above.  Dillon [name] MSW, LCSWA  01/09/2016, 11:07 AM

## 2016-01-09 NOTE — Progress Notes (Signed)
Pt has been pleasant and cooperative. Pt continues to be seclusive to his room. Pt continues to eat very little even with lots of encouragement. Pt denies SI and A/V hallucinations. Pt did not attend any unit activities. Pt's mood and affect has been depressed.

## 2016-01-09 NOTE — Progress Notes (Signed)
Patient ID: Dillon [name], male   DOB: December 31, 1950, 65 y.o.   MRN: [id number] [facility] MD Progress Note  01/09/2016 9:50 AM Dillon [name]  MRN:  [id number]  Subjective:    Dillon [name] is a 65 year old male with a history of depression and obsessive-compulsive disorder. Patient was admitted with the severe malnutrition and hopelessness and helplessness. Patient had a 40 pound weight loss which led to this admission. Patient has made very minimal improvement in his depression and continues to need food supplements such as Ensure for his nutrition. Patient has not been able to participate in groups or engage with staff. He has been started on fluoxetine 100 mg and Zyprexa 10 mg which he takes with some encouragement. He has been presenting with delusional thinking and paranoid thinking.  Patient was seen this morning in his room. This is the first visit for this patient with this clinician. He was lying on his side and was cooperative with the a few questions. Stated that he slept okay but wanted to continue to sleep. He reported that he ate some breakfast this morning. However patient  continued to lie on his side and did not make any eye contact with this clinician. He denied any suicidal thoughts. He denied any  Hallucinations. However per the Child psychotherapist. Patient presents well for a few questions and after he converses further a few minutes he has some delusional thinking. Social worker intends to contact his family members to discuss his outpatient mental health care. He is compliant with his medications and denies any side effects.   Principal Problem: Severe recurrent major depression without psychotic features (HCC) Diagnosis:   Patient Active Problem List   Diagnosis Date Noted  . Suicidal ideation [R45.851] 01/04/2016  . Severe recurrent major depression without psychotic features (HCC) [F33.2] 12/15/2015  . Grief [F43.21] 12/15/2015  . Weight loss [R63.4] 12/15/2015  . OCD (obsessive compulsive  disorder) [F42.9] 12/15/2015  . Benign prostatic hyperplasia with urinary obstruction [N40.1] 11/24/2015  . 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  Past Medical History:  Past Medical History  Diagnosis Date  . Meningitis     spinal  . OCD (obsessive compulsive disorder)   . Kidney stones     Past Surgical History  Procedure Laterality Date  . Kidney stone surgery     Family History:  Family History  Problem Relation Age of Onset  . Cancer Mother     bladder  . Emphysema Father   . Emphysema Sister   . COPD Sister   . Cancer Brother     prostate   Family Psychiatric  History: See H&P. Social History:  History  Alcohol Use No     History  Drug Use No    Social History   Social History  . Marital Status: Widowed    Spouse Name: N/A  . Number of Children: N/A  . Years of Education: N/A   Social History Main Topics  . Smoking status: Never Smoker   . Smokeless tobacco: Never Used  . Alcohol Use: No  . Drug Use: No  . Sexual Activity: Not Currently   Other Topics Concern  . None   Social History Narrative   Additional Social History:    History of alcohol / drug use?: No history of alcohol / drug abuse  Sleep: Poor  Appetite:  Poor  Current Medications: Current Facility-Administered Medications  Medication Dose Route Frequency Provider Last Rate Last Dose  . acetaminophen (TYLENOL) tablet 650 mg  650 mg Oral Q6H PRN [name], MD      . alum & mag hydroxide-simeth (MAALOX/MYLANTA) 200-200-20 MG/5ML suspension 30 mL  30 mL Oral Q4H PRN [name], MD      . feeding supplement (ENSURE ENLIVE) (ENSURE ENLIVE) liquid 237 mL  237 mL Oral TID BM Jolanta B Pucilowska, MD   237 mL at 01/09/16 0831  . fluvoxaMINE (LUVOX) tablet 100 mg  100 mg Oral QHS Shari Prows, MD   100 mg at 01/08/16 2127  .  magnesium hydroxide (MILK OF MAGNESIA) suspension 30 mL  30 mL Oral Daily PRN [name], MD   30 mL at 01/07/16 1458  . megestrol (MEGACE) 400 MG/10ML suspension 400 mg  400 mg Oral BID Shari Prows, MD   400 mg at 01/09/16 0832  . OLANZapine zydis (ZYPREXA) disintegrating tablet 10 mg  10 mg Oral QHS Shari Prows, MD   10 mg at 01/08/16 2127  . tamsulosin (FLOMAX) capsule 0.4 mg  0.4 mg Oral QPC supper [name], MD   0.4 mg at 01/08/16 1717  . traZODone (DESYREL) tablet 50 mg  50 mg Oral QHS PRN [name], MD        Lab Results:  No results found for this or any previous visit (from the past 48 hour(s)).  Blood Alcohol level:  Lab Results  Component Value Date   ETH <5 01/04/2016   ETH <5 12/15/2015    Physical Findings: AIMS: Facial and Oral Movements Muscles of Facial Expression: None, normal Lips and Perioral Area: None, normal Jaw: None, normal Tongue: None, normal,Extremity Movements Upper (arms, wrists, hands, fingers): None, normal Lower (legs, knees, ankles, toes): None, normal, Trunk Movements Neck, shoulders, hips: None, normal, Overall Severity Severity of abnormal movements (highest score from questions above): None, normal Incapacitation due to abnormal movements: None, normal Patient's awareness of abnormal movements (rate only patient's report): No Awareness, Dental Status Current problems with teeth and/or dentures?: Yes Does patient usually wear dentures?: No  CIWA:    COWS:     Musculoskeletal: Strength & Muscle Tone: decreased Gait & Station: unsteady Patient leans: N/A  Psychiatric Specialty Exam: Review of Systems  Constitutional: Positive for weight loss.  Neurological: Positive for weakness.  Psychiatric/Behavioral: Positive for depression and suicidal ideas.  All other systems reviewed and are negative.   Blood pressure 104/68, pulse 117, temperature 97.8 F (36.6 C), temperature source Oral, resp. rate 20, height   (1.753 m), weight 149 lb (67.586 kg), SpO2 100 %.Body mass index is 21.99 kg/(m^2).  General Appearance: Fairly Groomed  Patent attorney::  Minimal  Speech:  Garbled  Volume:  Decreased  Mood:  Minimal improvement  Affect:  Blunt  Thought Process:  Goal Directed  Orientation:  Full (Time, Place, and Person)  Thought Content:  Delusions and Paranoid Ideation  Suicidal Thoughts:  Yes.  with intent/plan  Homicidal Thoughts:  No  Memory:  Immediate;   Fair Recent;   Fair Remote;   Fair  Judgement:  Impaired  Insight:  Lacking  Psychomotor Activity:  Decreased  Concentration:  Fair  Recall:  [employer] of Knowledge:Fair  Language: Fair  Akathisia:  No  Handed:  Right  AIMS (if indicated):     Assets:  Psychologist, counselling  Resources/Insurance Housing Physical Health Resilience Social Support  ADL's:  Intact  Cognition: WNL  Sleep:  Number of Hours: 8   Treatment Plan Summary: Daily contact with patient to assess and evaluate symptoms and progress in treatment and Medication management   Dillon [name] is a 65 year old male with a history of anxiety and depression admitted for worsening of depression, malaise and severe weight loss in the context of recent major loss.  1. Suicidal ideation. The patient is still suicidal but unable to plan or execute any action. He is on 15 min checks.    2. Mood/anxietyand psychosis.Continue Luvox for depression and OCD and Zyprexa for psychosis.   3. Insomnia. He responded well to trazodone.   4. BPH. We continued Flomax.  5. Failure to thrive. We offered Megace and Ensure. Medicine input is greatly appreciated. Abdomen CT scan was unremarkable.  6. Cognitive decline. Psychological testing indicates unspecified neurocognitive disorder. Please see psychological consultation.  7. Metabolic syndrome monitoring. Lipid profile, TSH, and Hemoglobin A1c are normal. Prolactin 29.5.  8. R/O Vitamin B12 deficiency. Folic acid and vitamin  B12 are normal.  9. Deconditioning. PT consult is appreciated.   10. ECT. The patient does not have capacity to consent. The family is in the process of obtaining temporary guardianship. [name] is aware of this patient's needs.  11. Disposition. The patient will be discharged to home with family. He will follow-up with [facility].   Social worker has spoken with a family member Tresa EndoKelly who will be taking care of the patient's mental health needs. Social worker went over suicide precautions with this family member in detail.    Dillon [name], Dillon Sigmund, MD 01/09/2016, 9:50 AM

## 2016-01-09 NOTE — [facility] Group Notes (Signed)
[facility] Group Notes:  (Nursing/MHT/Case Management/Adjunct)  Date:  01/09/2016  Time:  2:32 AM  Type of Therapy:  Group Therapy  Participation Level:  Did Not Attend    Chattie Greeson Joy Kel Senn 01/09/2016, 2:32 AM

## 2016-01-09 NOTE — Progress Notes (Signed)
D: Observed pt in room lying down. Patient alert and oriented x4. Patient denies SI/HI/AVH. Pt affect is depressed. Pt indicated his mood is "pretty good." Pt discussed "weight loss" as main reason for admission, and identified "phobias and OCD" as reason pt was not eating well at home. Pt rated depression 2/10 and anxiety 2/10. Pt c/o of general weakness. Pt isolative to room. Pt forwards little.  A: Offered active listening and support. Provided therapeutic communication. Administered scheduled medications. Strongly encouraged pt to drink prescribed ensure, eat snack, and continue liquid intake. Educated pt on using call light for help ambulating. R: Pt drink one ensure and snack after writer had to enter room multiple times throughout night to encourage pt. Pt endorsed not feeling safe ambulating by self, and agreed to use call light when pt needs to ambulate. Pt not attending group. Pt pleasant and cooperative. Pt medication compliant. Will continue Q15 min. checks. Safety maintained.

## 2016-01-09 NOTE — [facility] Group Notes (Signed)
[facility] LCSW Group Therapy  01/09/2016 3:49 PM  Type of Therapy:  Group Therapy  Participation Level:  Did Not Attend  Modes of Intervention:  Discussion, Education, Socialization and Support  Summary of Progress/Problems:Pt will identify unhealthy thoughts and how they impact their emotions and behavior. Pt will be encouraged to discuss these thoughts, emotions and behaviors with the group.   Rashidah [place] L Collins Kerby MSW, LCSWA  01/09/2016, 3:49 PM   

## 2016-01-10 NOTE — [facility] Group Notes (Signed)
[facility] Group Notes:  (Nursing/MHT/Case Management/Adjunct)  Date:  01/10/2016  Time:  1:36 AM  Type of Therapy:  Group Therapy  Participation Level:  Did Not Attend    Summary of Progress/Problems:  Dillon [name] 01/10/2016, 1:36 AM

## 2016-01-10 NOTE — Progress Notes (Signed)
D: Patient denies SI/HI/AVH. Patient affect and mood are depressed.  Patient reports low energy.  His appetite has improved and staff has observed him eating the majority of his meal.  Patient is isolative and remained in his room throughout the shift.  Patient did not attend groups. Patient visible on the milieu. No distress noted. A: Support and encouragement offered. Scheduled medications given to pt. Q 15 min checks continued for patient safety. R: Patient receptive. Patient remains safe on the unit.

## 2016-01-10 NOTE — Progress Notes (Signed)
Patient ID: ISAMU TRAMMEL, male   DOB: 1951-04-14, 65 y.o.   MRN: [id number] [facility] MD Progress Note  01/10/2016 11:57 AM ZACKARIAH VANDERPOL  MRN:  [id number]  Subjective:    Mr. Bradish is a 65 year old male with a history of depression and obsessive-compulsive disorder. Patient was admitted with the severe malnutrition and hopelessness and helplessness. Patient had a 40 pound weight loss which led to this admission. Patient has made very minimal improvement in his depression and continues to need food supplements such as Ensure for his nutrition. Patient has not been able to participate in groups or engage with staff. He has been started on fluoxetine 100 mg and Zyprexa 10 mg which he takes with some encouragement. He has been presenting with delusional thinking and paranoid thinking.  Patient was seen this morning in his room. Patient was observed to be sleeping. He did not rouse with the calling his name. Allowed the patient to sleep and checked with staff about his status. Per staff patient did take his medications this morning. He also ate all his breakfast. Appears that he is slowly improving.  Per staff, "Observed pt in room lying down. Patient alert and oriented x4. Patient denies SI/HI/AVH. Pt affect is depressed. Pt indicated his mood is "pretty good...the same as it's been" Pt not attending groups. Pt asked for snack this evening. Pt rated depression 3/10 and anxiety 3/10. Pt c/o of general weakness. Pt isolative to room. Pt forwards little. Pt indicated that his appetite is improving. Pt fluid intake is improving. " Blood pressure continues to run a little low at 100/55 mmHg.  Principal Problem: Severe recurrent major depression without psychotic features (HCC) Diagnosis:   Patient Active Problem List   Diagnosis Date Noted  . Suicidal ideation [R45.851] 01/04/2016  . Severe recurrent major depression without psychotic features (HCC) [F33.2] 12/15/2015  . Grief [F43.21] 12/15/2015  . Weight  loss [R63.4] 12/15/2015  . OCD (obsessive compulsive disorder) [F42.9] 12/15/2015  . Benign prostatic hyperplasia with urinary obstruction [N40.1] 11/24/2015  . 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  Past Medical History:  Past Medical History  Diagnosis Date  . Meningitis     spinal  . OCD (obsessive compulsive disorder)   . Kidney stones     Past Surgical History  Procedure Laterality Date  . Kidney stone surgery     Family History:  Family History  Problem Relation Age of Onset  . Cancer Mother     bladder  . Emphysema Father   . Emphysema Sister   . COPD Sister   . Cancer Brother     prostate   Family Psychiatric  History: See H&P. Social History:  History  Alcohol Use No     History  Drug Use No    Social History   Social History  . Marital Status: Widowed    Spouse Name: N/A  . Number of Children: N/A  . Years of Education: N/A   Social History Main Topics  . Smoking status: Never Smoker   . Smokeless tobacco: Never Used  . Alcohol Use: No  . Drug Use: No  . Sexual Activity: Not Currently   Other Topics Concern  . None   Social History Narrative   Additional Social History:    History of alcohol / drug use?: No history of alcohol / drug abuse  Sleep: Poor  Appetite:  Poor  Current Medications: Current Facility-Administered Medications  Medication Dose Route Frequency Provider Last Rate Last Dose  . acetaminophen (TYLENOL) tablet 650 mg  650 mg Oral Q6H PRN [name], MD      . alum & mag hydroxide-simeth (MAALOX/MYLANTA) 200-200-20 MG/5ML suspension 30 mL  30 mL Oral Q4H PRN [name], MD      . feeding supplement (ENSURE ENLIVE) (ENSURE ENLIVE) liquid 237 mL  237 mL Oral TID BM Jolanta B Pucilowska, MD   237 mL at 01/09/16 2242  . fluvoxaMINE (LUVOX) tablet 100 mg  100 mg Oral QHS Jolanta  B Pucilowska, MD   100 mg at 01/09/16 2230  . magnesium hydroxide (MILK OF MAGNESIA) suspension 30 mL  30 mL Oral Daily PRN [name], MD   30 mL at 01/07/16 1458  . megestrol (MEGACE) 400 MG/10ML suspension 400 mg  400 mg Oral BID Shari Prows, MD   400 mg at 01/10/16 0912  . OLANZapine zydis (ZYPREXA) disintegrating tablet 10 mg  10 mg Oral QHS Jolanta B Pucilowska, MD   10 mg at 01/09/16 2230  . tamsulosin (FLOMAX) capsule 0.4 mg  0.4 mg Oral QPC supper [name], MD   0.4 mg at 01/09/16 1702  . traZODone (DESYREL) tablet 50 mg  50 mg Oral QHS PRN [name], MD        Lab Results:  No results found for this or any previous visit (from the past 48 hour(s)).  Blood Alcohol level:  Lab Results  Component Value Date   ETH <5 01/04/2016   ETH <5 12/15/2015    Physical Findings: AIMS: Facial and Oral Movements Muscles of Facial Expression: None, normal Lips and Perioral Area: None, normal Jaw: None, normal Tongue: None, normal,Extremity Movements Upper (arms, wrists, hands, fingers): None, normal Lower (legs, knees, ankles, toes): None, normal, Trunk Movements Neck, shoulders, hips: None, normal, Overall Severity Severity of abnormal movements (highest score from questions above): None, normal Incapacitation due to abnormal movements: None, normal Patient's awareness of abnormal movements (rate only patient's report): No Awareness, Dental Status Current problems with teeth and/or dentures?: Yes Does patient usually wear dentures?: No  CIWA:    COWS:     Musculoskeletal: Strength & Muscle Tone: decreased Gait & Station: unsteady Patient leans: N/A  Psychiatric Specialty Exam: Review of Systems  Constitutional: Positive for weight loss.  Neurological: Positive for weakness.  Psychiatric/Behavioral: Positive for depression and suicidal ideas.  All other systems reviewed and are negative.   Blood pressure 100/55, pulse 68, temperature 98 F (36.7 C),  temperature source Oral, resp. rate 20, height  (1.753 m), weight 149 lb (67.586 kg), SpO2 100 %.Body mass index is 21.99 kg/(m^2).  General Appearance: Fairly Groomed  Patent attorney::  Minimal  Speech:  Garbled  Volume:  Decreased  Mood:  Minimal improvement  Affect:  Blunt  Thought Process:  Goal Directed  Orientation:  Full (Time, Place, and Person)  Thought Content:  Delusions and Paranoid Ideation  Suicidal Thoughts:  Yes.  with intent/plan  Homicidal Thoughts:  No  Memory:  Immediate;   Fair Recent;   Fair Remote;   Fair  Judgement:  Impaired  Insight:  Lacking  Psychomotor Activity:  Decreased  Concentration:  Fair  Recall:  [employer] of Knowledge:Fair  Language: Fair  Akathisia:  No  Handed:  Right  AIMS (if indicated):     Assets:  Psychologist, counselling  Resources/Insurance Housing Physical Health Resilience Social Support  ADL's:  Intact  Cognition: WNL  Sleep:  Number of Hours: 8.5   Treatment Plan Summary: Daily contact with patient to assess and evaluate symptoms and progress in treatment and Medication management   [name] is a 65 year old male with a history of anxiety and depression admitted for worsening of depression, malaise and severe weight loss in the context of recent major loss.  1. Suicidal ideation. The patient is still suicidal but unable to plan or execute any action. He is on 15 min checks.    2. Mood/anxietyand psychosis.Continue Luvox for depression and OCD and Zyprexa for psychosis.   3. Insomnia. He responded well to trazodone.   4. BPH. We continued Flomax.  5. Failure to thrive. We offered Megace and Ensure. Medicine input is greatly appreciated. Abdomen CT scan was unremarkable.  6. Cognitive decline. Psychological testing indicates unspecified neurocognitive disorder. Please see psychological consultation.  7. Metabolic syndrome monitoring. Lipid profile, TSH, and Hemoglobin A1c are normal. Prolactin 29.5.  8.  R/O Vitamin B12 deficiency. Folic acid and vitamin B12 are normal.  9. Deconditioning. PT consult is appreciated.   10. ECT. The patient does not have capacity to consent. The family is in the process of obtaining temporary guardianship. [name] is aware of this patient's needs.  11. Disposition. The patient will be discharged to home with family. He will follow-up with [facility].   Social worker has spoken with a family member Tresa EndoKelly who will be taking care of the patient's mental health needs. Social worker went over suicide precautions with this family member in detail.    Patrick NorthAVI, Ozzie Remmers, MD 01/10/2016, 11:57 AM

## 2016-01-10 NOTE — Progress Notes (Signed)
D: Observed pt in room lying down. Patient alert and oriented x4. Patient denies SI/HI/AVH. Pt affect is depressed. Pt indicated his mood is "pretty good...the same as it's been" Pt not attending groups. Pt asked for snack this evening. Pt rated depression 3/10 and anxiety 3/10. Pt c/o of general weakness. Pt isolative to room. Pt forwards little. Pt indicated that his appetite is improving. Pt fluid intake is improving. A: Offered active listening and support. Provided therapeutic communication. Administered scheduled medications. Strongly encouraged pt to drink prescribed ensure, eat snack, and continue liquid intake. Educated pt on using call light for help ambulating. Encouraged pt to attend groups. R: Pt need much less encouragement to drink ensure and eat snack. Pt agreed to use call light when pt needs to ambulate. Pt stated "I would rather not" in regards to attending group, but with encouragement, agreed to attend at least one morning group. Pt pleasant and cooperative. Pt medication compliant. Will continue Q15 min. checks. Safety maintained.

## 2016-01-10 NOTE — [facility] Group Notes (Signed)
[facility] LCSW Group Therapy  01/10/2016 2:08 PM  Type of Therapy:  Group Therapy  Participation Level:  Did Not Attend  Modes of Intervention:  Activity, Discussion, Education, Socialization and Support  Summary of Progress/Problems: Balance in life: Patients will discuss the concept of balance and how it looks and feels to be unbalanced. Pt will identify areas in their life that is unbalanced and ways to become more balanced.    Jessaca Philippi L Monique Gift MSW, LCSWA  01/10/2016, 2:08 PM  

## 2016-01-10 NOTE — Plan of Care (Signed)
Problem: Alteration in mood Goal: STG-Patient reports thoughts of self-harm to staff Outcome: Progressing Pt denies SI

## 2016-01-11 ENCOUNTER — Inpatient Hospital Stay: Payer: BLUE CROSS/BLUE SHIELD

## 2016-01-11 NOTE — [facility] Group Notes (Signed)
[facility] LCSW Group Therapy   01/11/2016 1pm  Type of Therapy: Group Therapy   Participation Level: Did Not Attend. Patient invited to participate but declined.    Dillon [name] F. Dell Briner, MSW, LCSWA, LCAS   

## 2016-01-11 NOTE — Progress Notes (Signed)
D: Pt denies SI/HI/AVH. Pt is pleasant and cooperative, affect is flat and sad denies pian or discomfort. Pt is not interacting with peers; isolates to the room,  he appears less anxious, he drank evening nutrition shake .  A: Pt was offered support and encouragement. Pt was given scheduled medications. Pt was encouraged to attend groups. Q 15 minute checks were done for safety.  R:Pt did not attends group. Pt is taking medication. Pt has no complaints.Pt receptive to treatment and safety maintained on unit.

## 2016-01-11 NOTE — Progress Notes (Signed)
   01/11/16 1657  What Happened  Was fall witnessed? No

## 2016-01-11 NOTE — Plan of Care (Signed)
Problem: Alteration in mood Goal: LTG-Patient reports reduction in suicidal thoughts (Patient reports reduction in suicidal thoughts and is able to verbalize a safety plan for whenever patient is feeling suicidal)  Outcome: Progressing Patient denies SI/HI.      

## 2016-01-11 NOTE — Progress Notes (Signed)
[facility] MD Progress Note  01/11/2016 3:36 PM SHANTA DORVIL  MRN:  [id number]  Subjective:  Mr. Gaudin reports slight improvement. He currently ate his breakfast this morning and has been drinking liquids more. He is still slumped in bed, not responding to my presence, answering questions with monosyllables. He does not participate in programming. He has no somatic complaints. He accepts and tolerates medications well.  Principal Problem: Severe recurrent major depression without psychotic features (HCC) Diagnosis:   Patient Active Problem List   Diagnosis Date Noted  . Suicidal ideation [R45.851] 01/04/2016  . Severe recurrent major depression without psychotic features (HCC) [F33.2] 12/15/2015  . Grief [F43.21] 12/15/2015  . Weight loss [R63.4] 12/15/2015  . OCD (obsessive compulsive disorder) [F42.9] 12/15/2015  . Benign prostatic hyperplasia with urinary obstruction [N40.1] 11/24/2015  . 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  Past Medical History:  Past Medical History  Diagnosis Date  . Meningitis     spinal  . OCD (obsessive compulsive disorder)   . Kidney stones     Past Surgical History  Procedure Laterality Date  . Kidney stone surgery     Family History:  Family History  Problem Relation Age of Onset  . Cancer Mother     bladder  . Emphysema Father   . Emphysema Sister   . COPD Sister   . Cancer Brother     prostate   Family Psychiatric  History: See H&P Social History:  History  Alcohol Use No     History  Drug Use No    Social History   Social History  . Marital Status: Widowed    Spouse Name: N/A  . Number of Children: N/A  . Years of Education: N/A   Social History Main Topics  . Smoking status: Never Smoker   . Smokeless tobacco: Never Used  . Alcohol Use: No  . Drug Use: No  . Sexual Activity: Not Currently   Other Topics Concern  .  None   Social History Narrative   Additional Social History:    History of alcohol / drug use?: No history of alcohol / drug abuse                    Sleep: Fair  Appetite:  Poor  Current Medications: Current Facility-Administered Medications  Medication Dose Route Frequency Provider Last Rate Last Dose  . acetaminophen (TYLENOL) tablet 650 mg  650 mg Oral Q6H PRN [name], MD      . alum & mag hydroxide-simeth (MAALOX/MYLANTA) 200-200-20 MG/5ML suspension 30 mL  30 mL Oral Q4H PRN [name], MD      . feeding supplement (ENSURE ENLIVE) (ENSURE ENLIVE) liquid 237 mL  237 mL Oral TID BM Ellayna Hilligoss B Emryn Flanery, MD   237 mL at 01/11/16 1400  . fluvoxaMINE (LUVOX) tablet 100 mg  100 mg Oral QHS Shari Prows, MD   100 mg at 01/10/16 2205  . magnesium hydroxide (MILK OF MAGNESIA) suspension 30 mL  30 mL Oral Daily PRN [name], MD   30 mL at 01/07/16 1458  . megestrol (MEGACE) 400 MG/10ML suspension 400 mg  400 mg Oral BID Shari Prows, MD   400 mg at 01/11/16 0910  . OLANZapine zydis (ZYPREXA) disintegrating tablet 10 mg  10 mg Oral QHS Shari Prows, MD   10 mg at 01/10/16 2205  .  tamsulosin (FLOMAX) capsule 0.4 mg  0.4 mg Oral QPC supper [name], MD   0.4 mg at 01/10/16 1838  . traZODone (DESYREL) tablet 50 mg  50 mg Oral QHS PRN [name], MD        Lab Results: No results found for this or any previous visit (from the past 48 hour(s)).  Blood Alcohol level:  Lab Results  Component Value Date   ETH <5 01/04/2016   ETH <5 12/15/2015    Physical Findings: AIMS: Facial and Oral Movements Muscles of Facial Expression: None, normal Lips and Perioral Area: None, normal Jaw: None, normal Tongue: None, normal,Extremity Movements Upper (arms, wrists, hands, fingers): None, normal Lower (legs, knees, ankles, toes): None, normal, Trunk Movements Neck, shoulders, hips: None, normal, Overall Severity Severity of abnormal  movements (highest score from questions above): None, normal Incapacitation due to abnormal movements: None, normal Patient's awareness of abnormal movements (rate only patient's report): No Awareness, Dental Status Current problems with teeth and/or dentures?: Yes Does patient usually wear dentures?: No  CIWA:    COWS:     Musculoskeletal: Strength & Muscle Tone: within normal limits Gait & Station: normal Patient leans: N/A  Psychiatric Specialty Exam: Review of Systems  Constitutional: Positive for malaise/fatigue.  Neurological: Positive for weakness.  Psychiatric/Behavioral: Positive for depression and suicidal ideas.  All other systems reviewed and are negative.   Blood pressure 98/65, pulse 76, temperature 97.7 F (36.5 C), temperature source Oral, resp. rate 20, height  (1.753 m), weight 67.586 kg (149 lb), SpO2 100 %.Body mass index is 21.99 kg/(m^2).  General Appearance: Disheveled  Eye Contact::  Minimal  Speech:  Slow  Volume:  Decreased  Mood:  Anxious and Depressed  Affect:  Blunt  Thought Process:  Goal Directed  Orientation:  Full (Time, Place, and Person)  Thought Content:  Delusions and Paranoid Ideation  Suicidal Thoughts:  Yes.  with intent/plan  Homicidal Thoughts:  No  Memory:  Immediate;   Fair Recent;   Fair Remote;   Fair  Judgement:  Fair  Insight:  Lacking  Psychomotor Activity:  Decreased  Concentration:  Fair  Recall:  [employer] of Knowledge:Fair  Language: Fair  Akathisia:  No  Handed:  Right  AIMS (if indicated):     Assets:  Communication Skills Desire for Improvement Financial Resources/Insurance Housing Physical Health Resilience Social Support  ADL's:  Intact  Cognition: WNL  Sleep:  Number of Hours: 8.75   Treatment Plan Summary: Daily contact with patient to assess and evaluate symptoms and progress in treatment and Medication management   Mr. Covault is a 65 year old male with a history of anxiety and depression  admitted for worsening of depression, malaise and severe weight loss in the context of recent major loss.  1. Suicidal ideation. The patient is still suicidal but unable to plan or execute any action. He is on 15 min checks.    2. Mood/anxietyand psychosis.Continue Luvox for depression and OCD and Zyprexa for psychosis.   3. Insomnia. He responded well to trazodone.   4. BPH. We continued Flomax.  5. Failure to thrive. We offered Megace and Ensure. Medicine input is greatly appreciated. Abdomen CT scan was unremarkable.  6. Cognitive decline. Psychological testing indicates unspecified neurocognitive disorder. Please see psychological consultation.  7. Metabolic syndrome monitoring. Lipid profile, TSH, and Hemoglobin A1c are normal. Prolactin 29.5.  8. R/O Vitamin B12 deficiency. Folic acid and vitamin B12 are normal.  9. Deconditioning. PT consult is  appreciated.   10. ECT. The patient does not have capacity to consent. The family is in the process of obtaining temporary guardianship. [name] is aware of this patient's needs.  11. Disposition. The patient will be discharged to home with family. He will follow-up with [facility].   Kristine LineaJolanta Glee Lashomb, MD 01/11/2016, 3:36 PM

## 2016-01-11 NOTE — [facility] Group Notes (Signed)
[facility] Group Notes:  (Nursing/MHT/Case Management/Adjunct)  Date:  01/11/2016  Time:  4:12 AM  Type of Therapy:  Group Therapy  Participation Level:  Did Not Attend    Summary of Progress/Problems:  Dillon [name] 01/11/2016, 4:12 AM

## 2016-01-11 NOTE — [facility] Group Notes (Signed)
[facility] Group Notes:  (Nursing/MHT/Case Management/Adjunct)  Date:  01/11/2016  Time:  3:09 PM  Type of Therapy:  Psychoeducational Skills  Participation Level:  Did Not Attend   Lynelle SmokeCara Travis Eff[facility] HospitalMadoni 01/11/2016, 3:09 PM

## 2016-01-11 NOTE — Plan of Care (Signed)
Problem: Alteration in mood Goal: LTG-Pt's behavior demonstrates decreased signs of depression (Patient's behavior demonstrates decreased signs of depression to the point the patient is safe to return home and continue treatment in an outpatient setting)  Outcome: Not Progressing Still depressed. Flat affect noted.

## 2016-01-11 NOTE — Progress Notes (Signed)
Recreation Therapy Notes  Date: 03.27.17 Time: 3:00 pm Location: Craft Room  Group Topic: Self-expression  Goal Area(s) Addresses:  Patient will write at least one emotion they are experiencing. Patient will verbalize benefit of using art as a means of self-expression.  Behavioral Response: Did not attend  Intervention: Bottled Up  Activity: Patients were instructed to draw a bottle the way they viewed themselves. LRT provided examples. Patients were instructed to write the emotions they were feeling inside the bottle.  Education: LRT educated patients on different forms of self-expression.  Education Outcome: Patient did not attend group.  Clinical Observations/Feedback: Patient did not attend group.   Jacquelynn CreeGreene,Darly Massi M, LRT/CTRS 01/11/2016 3:49 PM

## 2016-01-11 NOTE — Plan of Care (Signed)
Problem: Ineffective individual coping Goal: STG: Pt will be able to identify effective and ineffective STG: Pt will be able to identify effective and ineffective coping patterns  Outcome: Not Progressing Patient remains depressed, isolative to room and has a flat affect.

## 2016-01-11 NOTE — [facility] Group Notes (Signed)
[facility] LCSW Aftercare Discharge Planning Group Note  01/11/2016 9:30 AM  Participation Quality: Did Not Attend. Patient invited to participate but declined.   Joan Avetisyan F. Sultana Tierney, MSW, LCSWA, LCAS   

## 2016-01-11 NOTE — Progress Notes (Signed)
Nutrition Follow-up  INTERVENTION:   Meals and Snacks: Cater to patient preferences Medical Food Supplement Therapy: continue Ensure supplement, added Magic Cup BID  NUTRITION DIAGNOSIS:   Unintentional weight loss related to social / environmental circumstances, poor appetite as evidenced by percent weight loss.  GOAL:   Patient will meet greater than or equal to 90% of their needs  MONITOR:   PO intake, Supplement acceptance, Labs, Weight trends, I & O's  REASON FOR ASSESSMENT:   Malnutrition Screening Tool    ASSESSMENT:     Diet Order:  Diet regular Room service appropriate?: Yes; Fluid consistency:: Thin   Energy Intake: recorded po intake 59% on average, drinking some Ensure  Meds: megace  Height:   Ht Readings from Last 1 Encounters:  01/04/16 5\' 9"  (1.753 m)    Weight:   Wt Readings from Last 1 Encounters:  01/05/16 149 lb (67.586 kg)    Filed Weights   01/04/16 1900 01/05/16 1500  Weight: 151 lb (68.493 kg) 149 lb (67.586 kg)    BMI:  Body mass index is 21.99 kg/(m^2).  Estimated Nutritional Needs:   Kcal:  1715-2055kcals  Protein:  55-68g protein (0.8-1.0g/kg)  Fluid:  1700-223500mL of fluid  EDUCATION NEEDS:   No education needs identified at this time  LOW Care Level  Romelle Starcherate Jeanie Mccard MS, RD, LDN [phone] Pager  [phone] Weekend/On-Call Pager

## 2016-01-11 NOTE — Progress Notes (Signed)
   01/11/16 1657  What Happened  Was fall witnessed? No  Was patient injured? Yes (small laceration under under left eye)  Patient found other (Comment) (sitting on bed)  Found by No one-pt stated they fell  Stated prior activity ambulating-unassisted  Follow Up  MD notified Dr. Jennet MaduroPucilowska  Time MD notified [phone]  Family notified Yes-comment (daughter Tresa EndoKelly)  Time family notified 1726  Additional tests Yes-comment (CT scan of head)  Simple treatment Ice  Adult Fall Risk Assessment  Risk Factor Category (scoring not indicated) Fall has occurred during this admission (document High fall risk)  Patient's Fall Risk High Fall Risk (>13 points)  Adult Fall Risk Interventions  Required Bundle Interventions *See Row Information* High fall risk - low, moderate, and high requirements implemented  Additional Interventions Fall risk signage;Room near nurses station;Specialty bed:  Low bed;Other (Comment) (bed alarm)  Fall with Injury Screening  Risk For Fall Injury- See Row Information  Nurse judgement  Intervention(s) for 2 or more risk criteria identified Low Bed  Vitals  BP 113/81 mmHg  BP Location Left Arm  BP Method Automatic  Patient Position (if appropriate) Sitting  Pulse Rate 94  Pulse Rate Source Dinamap  Oxygen Therapy  SpO2 100 %  O2 Device Room Air

## 2016-01-11 NOTE — Progress Notes (Signed)
Patient maintains a flat depressed affect. Was isolative to his room but was however encouraged to go to the dayroom for his meals, which he did via wheelchair. He also showered with much encouragement. Did not attend any groups. Remains a fall risk, No Falls reported.

## 2016-01-12 NOTE — Plan of Care (Signed)
Problem: Ineffective individual coping Goal: LTG: Patient will report a decrease in negative feelings Outcome: Not Progressing Isolative, not wanting to come out of room , no programing .

## 2016-01-12 NOTE — [facility] Group Notes (Signed)
[facility] Group Notes:  (Nursing/MHT/Case Management/Adjunct)  Date:  01/12/2016  Time:  1:53 PM  Type of Therapy:  Psychoeducational Skills  Participation Level:  Did Not Attend  Dillon [name] 01/12/2016, 1:53 PM

## 2016-01-12 NOTE — Progress Notes (Addendum)
[facility] MD Progress Note  01/12/2016 11:23 AM Dillon [name]  MRN:  [id number]  Subjective:  Dillon [name] still feels depressed, despondent, and suicidal. He fell yesterday on his way to the bathroom while using a walker. Head CT scan and knee x-ray were unremarkable. Today his blood pressure is low and his orthostatic. PT was unable to work with the patient today. He himself reports feeling better but it hardly shows. He is slumped in bed, interested, unable to participate in the interview in a meaningful way. He takes medications as prescribed and tolerating them well. It is unclear at this point if the family is pursuing temporary guardianship to administer ECT treatment that may be life saving.  Principal Problem: Severe recurrent major depression without psychotic features (HCC) Diagnosis:   Patient Active Problem List   Diagnosis Date Noted  . Suicidal ideation [R45.851] 01/04/2016  . Severe recurrent major depression without psychotic features (HCC) [F33.2] 12/15/2015  . Grief [F43.21] 12/15/2015  . Weight loss [R63.4] 12/15/2015  . OCD (obsessive compulsive disorder) [F42.9] 12/15/2015  . Benign prostatic hyperplasia with urinary obstruction [N40.1] 11/24/2015  . 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  Past Medical History:  Past Medical History  Diagnosis Date  . Meningitis     spinal  . OCD (obsessive compulsive disorder)   . Kidney stones     Past Surgical History  Procedure Laterality Date  . Kidney stone surgery     Family History:  Family History  Problem Relation Age of Onset  . Cancer Mother     bladder  . Emphysema Father   . Emphysema Sister   . COPD Sister   . Cancer Brother     prostate   Family Psychiatric  History: See H&P. Social History:  History  Alcohol Use No     History  Drug Use No    Social History   Social History  . Marital  Status: Widowed    Spouse Name: N/A  . Number of Children: N/A  . Years of Education: N/A   Social History Main Topics  . Smoking status: Never Smoker   . Smokeless tobacco: Never Used  . Alcohol Use: No  . Drug Use: No  . Sexual Activity: Not Currently   Other Topics Concern  . None   Social History Narrative   Additional Social History:    History of alcohol / drug use?: No history of alcohol / drug abuse                    Sleep: Fair  Appetite:  Fair  Current Medications: Current Facility-Administered Medications  Medication Dose Route Frequency Provider Last Rate Last Dose  . acetaminophen (TYLENOL) tablet 650 mg  650 mg Oral Q6H PRN [name], MD      . alum & mag hydroxide-simeth (MAALOX/MYLANTA) 200-200-20 MG/5ML suspension 30 mL  30 mL Oral Q4H PRN [name], MD      . feeding supplement (ENSURE ENLIVE) (ENSURE ENLIVE) liquid 237 mL  237 mL Oral TID BM Hashem Goynes B Chiquitta Matty, MD   237 mL at 01/12/16 0931  . fluvoxaMINE (LUVOX) tablet 100 mg  100 mg Oral QHS Shari Prows, MD   100 mg at 01/11/16 2109  . magnesium hydroxide (MILK OF MAGNESIA) suspension 30 mL  30 mL Oral Daily PRN [name], MD   30 mL at  01/07/16 1458  . megestrol (MEGACE) 400 MG/10ML suspension 400 mg  400 mg Oral BID Shari Prows, MD   400 mg at 01/12/16 0931  . OLANZapine zydis (ZYPREXA) disintegrating tablet 10 mg  10 mg Oral QHS Shari Prows, MD   10 mg at 01/11/16 2109  . tamsulosin (FLOMAX) capsule 0.4 mg  0.4 mg Oral QPC supper [name], MD   0.4 mg at 01/11/16 1711  . traZODone (DESYREL) tablet 50 mg  50 mg Oral QHS PRN [name], MD        Lab Results: No results found for this or any previous visit (from the past 48 hour(s)).  Blood Alcohol level:  Lab Results  Component Value Date   ETH <5 01/04/2016   ETH <5 12/15/2015    Physical Findings: AIMS: Facial and Oral Movements Muscles of Facial Expression: None, normal Lips and  Perioral Area: None, normal Jaw: None, normal Tongue: None, normal,Extremity Movements Upper (arms, wrists, hands, fingers): None, normal Lower (legs, knees, ankles, toes): None, normal, Trunk Movements Neck, shoulders, hips: None, normal, Overall Severity Severity of abnormal movements (highest score from questions above): None, normal Incapacitation due to abnormal movements: None, normal Patient's awareness of abnormal movements (rate only patient's report): No Awareness, Dental Status Current problems with teeth and/or dentures?: Yes Does patient usually wear dentures?: No  CIWA:    COWS:     Musculoskeletal: Strength & Muscle Tone: decreased Gait & Station: unsteady Patient leans: N/A  Psychiatric Specialty Exam: Review of Systems  Constitutional: Positive for weight loss and malaise/fatigue.  Musculoskeletal: Positive for falls.  Neurological: Positive for weakness.  Psychiatric/Behavioral: Positive for depression and suicidal ideas. The patient is nervous/anxious.   All other systems reviewed and are negative.   Blood pressure 117/63, pulse 76, temperature 97.7 F (36.5 C), temperature source Oral, resp. rate 20, height  (1.753 m), weight 67.586 kg (149 lb), SpO2 100 %.Body mass index is 21.99 kg/(m^2).  General Appearance: Disheveled  Eye Contact::  Minimal  Speech:  Slow  Volume:  Decreased  Mood:  Depressed, Hopeless and Worthless  Affect:  Blunt  Thought Process:  Goal Directed  Orientation:  Full (Time, Place, and Person)  Thought Content:  Delusions and Paranoid Ideation  Suicidal Thoughts:  Yes.  with intent/plan  Homicidal Thoughts:  No  Memory:  Immediate;   Fair Recent;   Fair Remote;   Fair  Judgement:  Impaired  Insight:  Lacking  Psychomotor Activity:  Decreased  Concentration:  Fair  Recall:  [employer] of Knowledge:Fair  Language: Fair  Akathisia:  No  Handed:  Right  AIMS (if indicated):     Assets:  Medical laboratory scientific officer Housing Social Support  ADL's:  Intact  Cognition: WNL  Sleep:  Number of Hours: 8.5   Treatment Plan Summary: Daily contact with patient to assess and evaluate symptoms and progress in treatment and Medication management   Dillon [name] is a 65 year old male with a history of anxiety and depression admitted for worsening of depression, malaise and severe weight loss in the context of recent major loss.  1. Suicidal ideation. The patient is still suicidal but unable to plan or execute any action. He is on 15 min checks.    2. Mood/anxietyand psychosis.Continue Luvox for depression and OCD and Zyprexa for psychosis.   3. Insomnia. He responded well to trazodone.   4. BPH. We continued Flomax.  5. Failure to thrive. We offered Megace  and Ensure. Medicine input is greatly appreciated. Abdomen CT scan was unremarkable.  6. Cognitive decline. Psychological testing indicates unspecified neurocognitive disorder. Please see psychological consultation.  7. Metabolic syndrome monitoring. Lipid profile, TSH, and Hemoglobin A1c are normal. Prolactin 29.5.  8. R/O Vitamin B12 deficiency. Folic acid and vitamin B12 are normal.  9. Deconditioning. PT consult is appreciated.   10. ECT. The patient does not have capacity to consent. The family is in the process of obtaining temporary guardianship. [name] is aware of this patient's needs.  11. Fall. Head CT scan and left knee x-ray were negative.   12. Disposition. The patient will be discharged to home with family. He will follow-up with [facility].   Kristine LineaJolanta Seven [place], MD 01/12/2016, 11:23 AM

## 2016-01-12 NOTE — Progress Notes (Signed)
Physical Therapy Treatment Patient Details Name: RANDEE UPCHURCH MRN: [id number] DOB: [birth year] Today's Date: 01/12/2016    History of Present Illness The patient has a long history of anxiety with OCD type of symptoms that went mostly untreated. He became severely depressed and anxious after his wife passed away in 2015-01-26. He lost 35 pounds initially. He was hospitalized at [facility] in March and discharged 10 days ago. He has not been taking any medications at home, he has not been eating or drinking fluids. He did not follow-up with his appointments, grief counseling or senior center. He lost 16 pounds in the past 10 days. He lost interest in food and no longer feels hungry. He also has abdominal discomfort. He does not eat because he is afraid it will making sick. He reports poor sleep, decreased appetite, anhedonia, hopelessness worthlessness, poor energy and lack of interest, social isolation, crying spells, worsening of anxiety, and suicidal ideation. The patient has been trying to deal with it the best he could but has been struggling recently. He denies psychotic symptoms. He denies symptoms suggestive of bipolar disorder. He does not use drugs or alcohol. Pt was admitted to behavioral medicine unit at [facility] for depressive episode with suicidal ideation. Pt is pleasant upon arrival with physical therapist although feels generally weak and low on energy. He reports full independence with ADLs/IADLs prior to arrival. He typically does not ambulate with an assistive device and denies any falls over the last 12 months.     PT Comments    Pt is making gradual progress towards goals, however still continues to be limited secondary to poor PO intake and vitals. Orthostatic BP performed with seated BP at 111/73 and standing at 81/49. RN notified. Pt is not safe for further gait training at this time as he becomes dizzy. Pt noted fall yesterday (3.27), however no injury. Pt  reports he was ambulating without assistance during fall. Pt educated on safety with in room mobility and cued to lock WC brakes prior to transfer. Good endurance with there-ex this date. HEP given to patient for continued use. Pt with improvement in functional mobility, only requiring HHA at this time.  Follow Up Recommendations  Home health PT;Other (comment)     Equipment Recommendations  Rolling walker with 5" wheels    Recommendations for Other Services       Precautions / Restrictions Precautions Precautions: Fall Restrictions Weight Bearing Restrictions: No    Mobility  Bed Mobility Overal bed mobility: Independent             General bed mobility comments: Pt demonstrates good speed and sequencing when transitioning from sidelying to sitting  Transfers Overall transfer level: Needs assistance Equipment used: None Transfers: Sit to/from Stand Sit to Stand: Min guard         General transfer comment: safe technique with transfers. Transfer performed from low bed. Once standing, pt able to stand with no LOB and no HHA  Ambulation/Gait Ambulation/Gait assistance: Min guard Ambulation Distance (Feet): 3 Feet Assistive device: None Gait Pattern/deviations: Step-to pattern     General Gait Details: slow step to gait pattern performed with safe technique from bed->WC. Further gait not performed secondary to decreased BP. Pt complains of dizziness with all mobility   Administrator mobility: Yes Wheelchair propulsion: Both upper extremities Distance: 100 Wheelchair Assistance Details (indicate cue type and reason): Pt able  to self propel WC with B UE, slow technique. Pt fatigues with increased distance. Cues given for turns. Pt fatigues with increased distance  Modified Rankin (Stroke Patients Only)       Balance                                    Cognition Arousal/Alertness:  Awake/alert Behavior During Therapy: WFL for tasks assessed/performed Overall Cognitive Status: Within Functional Limits for tasks assessed                      Exercises Other Exercises Other Exercises: Supine ther-ex performed on B LE including SLRs, ankle pumps. SAQ, and hip abd/add. All ther-ex performed x 15 reps with cga and correct technique.    General Comments        Pertinent Vitals/Pain Pain Assessment: Faces Faces Pain Scale: Hurts little more Pain Location: groin Pain Descriptors / Indicators: Aching Pain Intervention(s): Limited activity within patient's tolerance    Home Living                      Prior Function            PT Goals (current goals can now be found in the care plan section) Acute Rehab PT Goals Patient Stated Goal: None provided by patient PT Goal Formulation: Patient unable to participate in goal setting Progress towards PT goals: Progressing toward goals    Frequency  Min 2X/week    PT Plan Current plan remains appropriate    Co-evaluation             End of Session Equipment Utilized During Treatment: Gait belt Activity Tolerance: Patient limited by fatigue Patient left: in bed     Time: [id number] PT Time Calculation (min) (ACUTE ONLY): 23 min  Charges:  $Therapeutic Exercise: 8-22 mins $Wheel Chair Management: 8-22 mins                    G Codes:      Paysley Poplar 01/12/2016, 12:54 PM Elizabeth PalauStephanie Johari Bennetts, PT, DPT [phone]

## 2016-01-12 NOTE — Plan of Care (Signed)
Problem: Ineffective individual coping Goal: LTG: Patient will report a decrease in negative feelings Outcome: Progressing Pt rates depression as a 3 out of 10. Goal: STG: Patient will remain free from self harm Outcome: Progressing Pt remains free from harm.

## 2016-01-12 NOTE — Progress Notes (Signed)
Recreation Therapy Notes  Date: 03.28.17 Time: 3:00 pm Location: Craft Room  Group Topic: Goal Setting  Goal Area(s) Addresses:  Patient will write at least one goal. Patient will write at least one obstacle.  Behavioral Response: Did not attend  Intervention: Recovery Goal Chart  Activity: Patients were instructed to make a Recovery Goal Chart including goals, obstacles, the date they started working on their goals, and the date they achieved their goals.  Education: LRT educated patients on healthy ways to celebrate reaching their goals.  Education Outcome: Patient did not attend group.  Clinical Observations/Feedback: Patient did not attend group.  Jacquelynn CreeGreene,Lindsy Cerullo M, LRT/CTRS 01/12/2016 4:22 PM

## 2016-01-12 NOTE — Progress Notes (Signed)
D: Pt remains isolative in his room this evening. He rates his depression as a 3 out of 10 at this time. Denies SI/HI/AVH. Denies pain. Pt has a wheelchair at his bedside, but remains in his bed throughout the evening. Pt has a bed alarm. A: Emotional support and encouragement provided. Pt educated on fall prevention and encouraged to use call bell before getting up out of the bed. Vital signs taken per post-fall protocol. Pt encouraged to maintain fluid intake. Medications administered with education. q15 minute safety checks maintained. R: Pt remains free from harm. Will continue to monitor.

## 2016-01-12 NOTE — Progress Notes (Signed)
D: Patient stated slept good last night .Stated appetite is good and energy level  Is normal. Stated concentration is good . Stated on Depression scale , hopeless and anxiety .( low 0-10 high) Denies suicidal  homicidal ideations  .  No auditory hallucinations  No pain concerns . Appropriate ADL'S. Interacting with peers and staff. No  Group participation with unit programing Instructed on ECT. A: Encourage patient participation with unit programming . Instruction  Given on  Medication , verbalize understanding. R: Voice no other concerns. Staff continue to monitor

## 2016-01-12 NOTE — [facility] Group Notes (Signed)
[facility] LCSW Group Therapy   01/12/2016 11am  Type of Therapy: Group Therapy   Participation Level: Did Not Attend. Patient invited to participate but declined.    Finian Helvey F. Urbano Milhouse, MSW, LCSWA, LCAS   

## 2016-01-13 ENCOUNTER — Inpatient Hospital Stay: Payer: BLUE CROSS/BLUE SHIELD

## 2016-01-13 LAB — BASIC METABOLIC PANEL
Anion gap: 5 (ref 5–15)
BUN: 24 mg/dL — ABNORMAL HIGH (ref 6–20)
CO2: 25 mmol/L (ref 22–32)
Calcium: 10.1 mg/dL (ref 8.9–10.3)
Chloride: 109 mmol/L (ref 101–111)
Creatinine, Ser: 1.26 mg/dL — ABNORMAL HIGH (ref 0.61–1.24)
GFR calc Af Amer: >60 mL/min (ref >60)
GFR calc non Af Amer: 59 mL/min — ABNORMAL LOW (ref >60)
Glucose, Bld: 140 mg/dL — ABNORMAL HIGH (ref 65–99)
Potassium: 4.2 mmol/L (ref 3.5–5.1)
Sodium: 139 mmol/L (ref 135–145)

## 2016-01-13 LAB — CBC
HCT: 43.5 % (ref 40.0–52.0)
Hemoglobin: 15.4 g/dL (ref 13.0–18.0)
MCH: 31.4 pg (ref 26.0–34.0)
MCHC: 35.5 g/dL (ref 32.0–36.0)
MCV: 88.4 fL (ref 80.0–100.0)
Platelets: 158 10*3/uL (ref 150–440)
RBC: 4.91 MIL/uL (ref 4.40–5.90)
RDW: 13 % (ref 11.5–14.5)
WBC: 9.5 10*3/uL (ref 3.8–10.6)

## 2016-01-13 NOTE — Progress Notes (Signed)
[facility] MD Progress Note  01/13/2016 1:50 PM Dillon [name]  MRN:  [id number]  Subjective:  Dillon [name] appears slightly better today. He was able to sit up in bed during the interview. The used to father came that he was unable to straighten up when his doctors came as he is a sudden gentleman. He was able to have a sensible conversation of ECT treatment with me today. He initially was adamantly against that but it was at the time but he could not comprehend our plan. His children are in great support of treatment. They have been visiting daily and talking to the patient about it. Dr.Clapacs is out of town until Monday. The Dr. Toni Amend as well as his staff is aware of this patient's needs. I will order an EKG lab work and chest x-ray in preparation for treatment. The patient has been eating better but he hardly gets out of bed and has not been able to work effectively with PT. He is still indifferent but denied feeling actively suicidal in the hospital. He feels anxious but reports improvement in psychotic symptoms, he does not feel paranoid as much.  Principal Problem: Severe recurrent major depression without psychotic features (HCC) Diagnosis:   Patient Active Problem List   Diagnosis Date Noted  . Suicidal ideation [R45.851] 01/04/2016  . Severe recurrent major depression without psychotic features (HCC) [F33.2] 12/15/2015  . Grief [F43.21] 12/15/2015  . Weight loss [R63.4] 12/15/2015  . OCD (obsessive compulsive disorder) [F42.9] 12/15/2015  . Benign prostatic hyperplasia with urinary obstruction [N40.1] 11/24/2015  . 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  Past Medical History:  Past Medical History  Diagnosis Date  . Meningitis     spinal  . OCD (obsessive compulsive disorder)   . Kidney stones     Past Surgical History  Procedure Laterality Date  . Kidney stone  surgery     Family History:  Family History  Problem Relation Age of Onset  . Cancer Mother     bladder  . Emphysema Father   . Emphysema Sister   . COPD Sister   . Cancer Brother     prostate   Family Psychiatric  History: None reported.  Social History:  History  Alcohol Use No     History  Drug Use No    Social History   Social History  . Marital Status: Widowed    Spouse Name: N/A  . Number of Children: N/A  . Years of Education: N/A   Social History Main Topics  . Smoking status: Never Smoker   . Smokeless tobacco: Never Used  . Alcohol Use: No  . Drug Use: No  . Sexual Activity: Not Currently   Other Topics Concern  . None   Social History Narrative   Additional Social History:    History of alcohol / drug use?: No history of alcohol / drug abuse                    Sleep: Poor  Appetite:  Poor  Current Medications: Current Facility-Administered Medications  Medication Dose Route Frequency Provider Last Rate Last Dose  . acetaminophen (TYLENOL) tablet 650 mg  650 mg Oral Q6H PRN [name], MD      . alum & mag hydroxide-simeth (MAALOX/MYLANTA) 200-200-20 MG/5ML suspension 30 mL  30 mL Oral Q4H PRN [name], MD      .  feeding supplement (ENSURE ENLIVE) (ENSURE ENLIVE) liquid 237 mL  237 mL Oral TID BM Myrth Dahan B Kami Kube, MD   237 mL at 01/13/16 1024  . fluvoxaMINE (LUVOX) tablet 100 mg  100 mg Oral QHS Shari Prows, MD   100 mg at 01/12/16 2113  . magnesium hydroxide (MILK OF MAGNESIA) suspension 30 mL  30 mL Oral Daily PRN [name], MD   30 mL at 01/07/16 1458  . megestrol (MEGACE) 400 MG/10ML suspension 400 mg  400 mg Oral BID Shari Prows, MD   400 mg at 01/13/16 0956  . OLANZapine zydis (ZYPREXA) disintegrating tablet 10 mg  10 mg Oral QHS Shari Prows, MD   10 mg at 01/12/16 2113  . tamsulosin (FLOMAX) capsule 0.4 mg  0.4 mg Oral QPC supper [name], MD   0.4 mg at 01/12/16 1739  . traZODone  (DESYREL) tablet 50 mg  50 mg Oral QHS PRN [name], MD        Lab Results: No results found for this or any previous visit (from the past 48 hour(s)).  Blood Alcohol level:  Lab Results  Component Value Date   ETH <5 01/04/2016   ETH <5 12/15/2015    Physical Findings: AIMS: Facial and Oral Movements Muscles of Facial Expression: None, normal Lips and Perioral Area: None, normal Jaw: None, normal Tongue: None, normal,Extremity Movements Upper (arms, wrists, hands, fingers): None, normal Lower (legs, knees, ankles, toes): None, normal, Trunk Movements Neck, shoulders, hips: None, normal, Overall Severity Severity of abnormal movements (highest score from questions above): None, normal Incapacitation due to abnormal movements: None, normal Patient's awareness of abnormal movements (rate only patient's report): No Awareness, Dental Status Current problems with teeth and/or dentures?: Yes Does patient usually wear dentures?: No  CIWA:    COWS:     Musculoskeletal: Strength & Muscle Tone: within normal limits Gait & Station: normal Patient leans: N/A  Psychiatric Specialty Exam: Review of Systems  Constitutional: Positive for fever and weight loss.  Neurological: Positive for weakness.  All other systems reviewed and are negative.   Blood pressure 96/47, pulse 60, temperature 97.7 F (36.5 C), temperature source Oral, resp. rate 20, height  (1.753 m), weight 67.586 kg (149 lb), SpO2 99 %.Body mass index is 21.99 kg/(m^2).  General Appearance: Disheveled  Eye Contact::  Minimal  Speech:  Clear and Coherent  Volume:  Decreased  Mood:  Anxious, Depressed, Hopeless and Worthless  Affect:  Blunt  Thought Process:  Goal Directed  Orientation:  Full (Time, Place, and Person)  Thought Content:  Delusions and Paranoid Ideation  Suicidal Thoughts:  Yes.  without intent/plan  Homicidal Thoughts:  No  Memory:  Immediate;   Fair Recent;   Fair Remote;   Fair   Judgement:  Poor  Insight:  Shallow  Psychomotor Activity:  Psychomotor Retardation  Concentration:  Poor  Recall:  Poor  Fund of Knowledge:Poor  Language: Poor  Akathisia:  No  Handed:  Right  AIMS (if indicated):     Assets:  Manufacturing systems engineer Housing Physical Health Resilience Social Support  ADL's:  Intact  Cognition: WNL  Sleep:  Number of Hours: 9   Treatment Plan Summary: Daily contact with patient to assess and evaluate symptoms and progress in treatment and Medication management   Dillon [name] is a 66 year old male with a history of anxiety and depression admitted for worsening of depression, malaise and severe weight loss in the context of recent major  loss.  1. Suicidal ideation. The patient is still suicidal but unable to plan or execute any action. He is on 15 min checks.    2. Mood/anxietyand psychosis.Continue Luvox for depression and OCD and Zyprexa for psychosis.   3. Insomnia. He responded well to trazodone.   4. BPH. We continued Flomax.  5. Failure to thrive. We offered Megace and Ensure. Medicine input is greatly appreciated. Abdomen CT scan was unremarkable.  6. Cognitive decline. Psychological testing indicates unspecified neurocognitive disorder. Please see psychological consultation.  7. Metabolic syndrome monitoring. Lipid profile, TSH, and Hemoglobin A1c are normal. Prolactin 29.5.  8. R/O Vitamin B12 deficiency. Folic acid and vitamin B12 are normal.  9. Deconditioning. PT consult is appreciated.   10. ECT. The patient does not have capacity to consent. The family is in the process of obtaining temporary guardianship. [name] is aware of this patient's needs. I will order necessary lab work.  11. Fall. Head CT scan and left knee x-ray were negative.   12. Disposition. The patient will be discharged to home with family. He will follow-up with [facility].    Kristine LineaJolanta Peggie Hornak, MD 01/13/2016, 1:50 PM

## 2016-01-13 NOTE — Progress Notes (Signed)
Recreation Therapy Notes  Date: 03.29.17 Time: 3:00 pm Location: Craft Room  Group Topic: Self-esteem  Goal Area(s) Addresses:  Patient will be able to identify benefit of self-esteem. Patient will be able to identify ways to increase self-esteem.  Behavioral Response: Did not attend  Intervention: Self-Portrait  Activity: Patients were instructed to draw their self-portrait of how they felt. Afterwards, patients were instructed to write their name and positive trait about themselves. They passed their papers around and wrote positive traits about their peers. Patient read what their peers wrote and drew their self-portrait of how they felt after reading the positive traits.  Education: LRT educated patients on ways they can increase their self-esteem.  Education Outcome: Patient did not attend group.   Clinical Observations/Feedback: Patient did not attend group.  Vivek Grealish M, LRT/CTRS 01/13/2016 3:43 PM 

## 2016-01-13 NOTE — Tx Team (Signed)
Interdisciplinary Treatment Plan Update (Adult)  Date:  01/13/2016 Time Reviewed:  2:36 PM  Progress in Treatment: Attending groups: No. Participating in groups: No. Taking medication as prescribed: Yes. Tolerating medication: Yes. Family/Significant othe contact made: Yes, individual(s) contacted: daughter and son  Patient understands diagnosis: No. and As evidenced by: limited insight  Discussing patient identified problems/goals with staff: Yes. Medical problems stabilized or resolved: Yes. Denies suicidal/homicidal ideation: Yes. Issues/concerns per patient self-inventory: Yes. Other:  New problem(s) identified: No, Describe: NA  Discharge Plan or Barriers: Pt plans to return home and follow up with outpatient.   Reason for Continuation of Hospitalization: Anxiety Depression Medication stabilization Suicidal ideation Other; describe still not eating and drinking appropriately.  Comments: Dillon [name] appears slightly better today. He was able to sit up in bed during the interview. The used to father came that he was unable to straighten up when his doctors came as he is a  gentleman. He was able to have a sensible conversation of ECT treatment with me today. He initially was adamantly against that but it was at the time but he could not comprehend our plan. His children are in great support of treatment. They have been visiting daily and talking to the patient about it. Dr.Clapacs is out of town until Monday. The Dr. Weber Cooks as well as his staff is aware of this patient's needs. I will order an EKG lab work and chest x-ray in preparation for treatment. The patient has been eating better but he hardly gets out of bed and has not been able to work effectively with PT. He is still indifferent but denied feeling actively suicidal in the hospital. He feels anxious but reports improvement in psychotic symptoms, he does not feel paranoid as much.   Estimated length of stay: 7  days   New goal(s): NA  Review of initial/current patient goals per problem list:  1. Goal(s): Patient will participate in aftercare plan  Met:No  Target date: at discharge  As evidenced by: Patient will participate within aftercare plan AEB aftercare provider and housing plan at discharge being identified. 01/13/16-Goal in Progress 2. Goal (s): Patient will exhibit decreased depressive symptoms and suicidal ideations.  Met:No  Target date: at discharge  As evidenced by: Patient will utilize self rating of depression at 3 or below and demonstrate decreased signs of depression or be deemed stable for discharge by MD. 01/13/16-Goal in Progress, eating and drinking more often, still very isolated and inactive on unit 3. Goal(s): Patient will demonstrate decreased signs and symptoms of anxiety.  Met:No  Target date: at discharge  As evidenced by: Patient will utilize self rating of anxiety at 3 or below and demonstrated decreased signs of anxiety, or be deemed stable for discharge by MD 01/13/16-Goal in Progress 4. Goal (s): Patient will demonstrate decreased symptoms of psychosis.  Met:No  Target date: at discharge  As evidenced by: Patient will not endorse signs of psychosis or be deemed stable for discharge by MD 01/13/16-Goal in Progress Attendees: Patient:   3/29/20172:36 PM  Family:   3/29/20172:36 PM  Physician:  Orson Slick 3/29/20172:36 PM  Nursing:   Polly Cobia, RN 3/29/20172:36 PM  Case Manager:   3/29/20172:36 PM  Counselor:  Dossie Arbour, LCSW 3/29/20172:36 PM  Other:  Everitt Amber, [place] 3/29/20172:36 PM  Other:   3/29/20172:36 PM  Other:   3/29/20172:36 PM  Other:  3/29/20172:36 PM  Other:  3/29/20172:36 PM  Other:  3/29/20172:36 PM  Other:  3/29/20172:36 PM  Other:  3/29/20172:36 PM  Other:  3/29/20172:36 PM  Other:   3/29/20172:36 PM   Scribe for Treatment Team:   August Saucer, 01/13/2016, 2:36 PM, MSW, LCSW

## 2016-01-13 NOTE — Progress Notes (Deleted)
Medications administered as ordered by the physician, medications Therapeutic Effects, SEs and Adverse effects discussed, questions encouraged; no PRN given, 15 minute checks maintained for safety, clinical and moral support provided, patient encouraged to continue to express feelings and demonstrate safe care. Patient remain free from harm, will continue to monitor.    

## 2016-01-13 NOTE — [facility] Group Notes (Signed)
[facility] Group Notes:  (Nursing/MHT/Case Management/Adjunct)  Date:  01/13/2016  Time:  9:27 PM  Type of Therapy:  Evening Wrap-up Group  Participation Level:  Did Not Attend  Participation Quality:  N/A  Affect:  N/A  Cognitive:  N/A  Insight:  None  Engagement in Group:  N/A  Modes of Intervention:  N/A  Summary of Progress/Problems:  Dillon MorrowChelsea Nanta [name] 01/13/2016, 9:27 PM

## 2016-01-13 NOTE — Progress Notes (Signed)
Patient able to use call bell for bathroom assist, minimal assist provided.

## 2016-01-13 NOTE — Progress Notes (Signed)
Bed alarm re-attached back on patient, understanding of yellow armband and body alarm verbalized; patient was reminded to call for help before getting out of bed, "I fell yesterday, I will wear the body alarm .Marland Kitchen..." Patient requested for additional snacks and fresh ice water, appetite getting better, continues on Megace suspension 400 mg twice a day; poor appearance with poor hygine and needs encouragement to bathe. Denied SI/HI/SIB, denied AV/H, remains closer to the nurses' station. Will continue with POC.

## 2016-01-13 NOTE — Progress Notes (Signed)
D: Patient remains odorous   With interaction .Marland Kitchen.Limited interaction with staff and peers . Appetite improved this shift . Instructed  Patient  On exercise  From  Physical therapy . No unit programming / group participation . {Periods of napping during shift  Denies suicidal.  Chest  X;ray performed portable .  Remains depressed  With flat affect. A: Encourage patient participation with unit programming . Instruction  Given on  Medication , verbalize understanding. R: Voice no other concerns. Staff continue to monitor

## 2016-01-13 NOTE — Plan of Care (Signed)
Problem: Ineffective individual coping Goal: STG: Patient will remain free from self harm Outcome: Progressing Medications administered as ordered by the physician, medications Therapeutic Effects, SEs and Adverse effects discussed, questions encouraged; no PRN given, 15 minute checks maintained for safety, clinical and moral support provided, patient encouraged to continue to express feelings and demonstrate safe care. Patient remain free from harm, will continue to monitor.         

## 2016-01-13 NOTE — [facility] Group Notes (Signed)
[facility] LCSW Aftercare Discharge Planning Group Note  01/13/2016 9:30 AM  Participation Quality: Did Not Attend. Patient invited to participate but declined.   Odin Mariani F. Stevenson Windmiller, MSW, LCSWA, LCAS   

## 2016-01-13 NOTE — [facility] Group Notes (Signed)
[facility] Group Notes:  (Nursing/MHT/Case Management/Adjunct)  Date:  01/13/2016  Time:  12:59 PM  Type of Therapy:  Group Therapy  Participation Level:  Did Not Attend  Participation Quality: Summary of Progress/Problems:  Dillon NeerJackie L Chantille Navarrete 01/13/2016, 12:59 PM

## 2016-01-13 NOTE — Plan of Care (Signed)
Problem: Alteration in mood Goal: LTG-Patient reports reduction in suicidal thoughts (Patient reports reduction in suicidal thoughts and is able to verbalize a safety plan for whenever patient is feeling suicidal)  Outcome: Progressing Denies suicidal intent.

## 2016-01-13 NOTE — Progress Notes (Signed)
Awake in bed, denied pain, denied SI/HI, denied AV/H, reminded to continue to use call bell for bathroom assist, will continue the exercises as assigned.

## 2016-01-14 NOTE — Tx Team (Signed)
Interdisciplinary Treatment Plan Update (Adult)  Date:  01/14/2016 Time Reviewed:  1:48 PM  Progress in Treatment: Attending groups: No. Participating in groups:  No. Taking medication as prescribed:  Yes. Tolerating medication:  Yes. Family/Significant othe contact made:  Yes, individual(s) contacted:  daughter and son Patient understands diagnosis:  No. and As evidenced by:  limited insight Discussing patient identified problems/goals with staff:  Yes. Medical problems stabilized or resolved:  Yes. Denies suicidal/homicidal ideation: Yes. Issues/concerns per patient self-inventory:  Yes. Other:  New problem(s) identified: No, Describe:  NA  Discharge Plan or Barriers: Pt plans to return home and follow up with outpatient.    Reason for Continuation of Hospitalization: Depression Medication stabilization Suicidal ideation  Comments:Dillon [name] appears slightly better today. He was able to sit up in bed during the interview.  He was able to have a sensible conversation of ECT treatment with me today. He initially was adamantly against that but it was at the time but he could not comprehend our plan. His children are in great support of treatment. They have been visiting daily and talking to the patient about it. Dr.Clapacs is out of town until Monday. The Dr. Weber Cooks as well as his staff is aware of this patient's needs. I will order an EKG lab work and chest x-ray in preparation for treatment. The patient has been eating better but he hardly gets out of bed and has not been able to work effectively with PT. He is still indifferent but denied feeling actively suicidal in the hospital. He feels anxious but reports improvement in psychotic symptoms, he does not feel paranoid as much.   Estimated length of stay: 7 days   New goal(s): NA  Review of initial/current patient goals per problem list:   1.  Goal(s): Patient will participate in aftercare plan * Met: No * Target date: at  discharge * As evidenced by: Patient will participate within aftercare plan AEB aftercare provider and housing plan at discharge being identified.   2.  Goal (s): Patient will exhibit decreased depressive symptoms and suicidal ideations. * Met: No *  Target date: at discharge * As evidenced by: Patient will utilize self rating of depression at 3 or below and demonstrate decreased signs of depression or be deemed stable for discharge by MD.   3.  Goal(s): Patient will demonstrate decreased signs and symptoms of anxiety. * Met: No * Target date: at discharge * As evidenced by: Patient will utilize self rating of anxiety at 3 or below and demonstrated decreased signs of anxiety, or be deemed stable for discharge by MD *  4.  Goal (s): Patient will demonstrate decreased symptoms of psychosis. * Met: No  *  Target date: at discharge * As evidenced by: Patient will not endorse signs of psychosis or be deemed stable for discharge by MD.   Attendees: Patient:  Dillon [name] 3/30/20171:48 PM  Family:   3/30/20171:48 PM  Physician:   Dr. Pucilowska  3/30/20171:48 PM  Nursing:   Meredith Mody, RN  3/30/20171:48 PM  Case Manager:   3/30/20171:48 PM  Counselor:   3/30/20171:48 PM  Other: Everitt Amber, [place]  3/30/20171:48 PM  Other:  [place] 3/30/20171:48 PM  Other:   3/30/20171:48 PM  Other:  3/30/20171:48 PM  Other:  3/30/20171:48 PM  Other:  3/30/20171:48 PM  Other:  3/30/20171:48 PM  Other:  3/30/20171:48 PM  Other:  3/30/20171:48 PM  Other:   3/30/20171:48 PM   Scribe for Treatment Team:  Wray Kearns, MSW, LCSWA  01/14/2016, 1:48 PM

## 2016-01-14 NOTE — Plan of Care (Signed)
Problem: Ineffective individual coping Goal: STG: Patient will remain free from self harm Outcome: Progressing Medications administered as ordered by the physician, medications Therapeutic Effects, SEs and Adverse effects discussed, questions encouraged; no PRN given, 15 minute checks maintained for safety, room closer to the nurses' station for close observation, clinical and moral support provided, patient encouraged to continue to express feelings and demonstrate safe care. Patient remain free from harm, will continue to monitor.

## 2016-01-14 NOTE — Progress Notes (Signed)
D:Patient continue to use wheelchair as means of traveling about unit .Patient stated slept good last night .Stated appetite is fair and energy level  Isfairl. Stated concentration is fair . Stated on Depression scale 3 , hopeless 3 and anxiety 3 .( low 0-10 high) Denies suicidal  homicidal ideations  .  No auditory hallucinations  No pain concerns Limited  ADL'S. Interacting with peers and staff. Aware of ECT Treatment on Monday A: Encourage patient participation with unit programming . Instruction  Given on  Medication , verbalize understanding. R: Voice no other concerns. Staff continue to monitor

## 2016-01-14 NOTE — Progress Notes (Signed)
[facility] MD Progress Note  01/14/2016 3:26 PM Dillon [name]  MRN:  [id number]  Subjective:  Dillon [name] still feels very depressed. He is confined to his bed. He has not been able or willing to work with PT. He's been eating more and there are snacks in his room that are opened now. He is still "behind" on fluid intake but has been doing his best. He now is interested in getting ECT. He is able to have a sensible discussion about it and is ready to sign his consent. This will have to wait for Dr. Toni Amend to come back from vacation. This can be done on Monday. Chest x-ray EKG and labs were done yesterday.  Principal Problem: Severe recurrent major depression without psychotic features (HCC) Diagnosis:   Patient Active Problem List   Diagnosis Date Noted  . Suicidal ideation [R45.851] 01/04/2016  . Severe recurrent major depression without psychotic features (HCC) [F33.2] 12/15/2015  . Grief [F43.21] 12/15/2015  . Weight loss [R63.4] 12/15/2015  . OCD (obsessive compulsive disorder) [F42.9] 12/15/2015  . Benign prostatic hyperplasia with urinary obstruction [N40.1] 11/24/2015  . 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  Past Medical History:  Past Medical History  Diagnosis Date  . Meningitis     spinal  . OCD (obsessive compulsive disorder)   . Kidney stones     Past Surgical History  Procedure Laterality Date  . Kidney stone surgery     Family History:  Family History  Problem Relation Age of Onset  . Cancer Mother     bladder  . Emphysema Father   . Emphysema Sister   . COPD Sister   . Cancer Brother     prostate   Family Psychiatric  History: See H&P. Social History:  History  Alcohol Use No     History  Drug Use No    Social History   Social History  . Marital Status: Widowed    Spouse Name: N/A  . Number of Children: N/A  . Years of Education: N/A    Social History Main Topics  . Smoking status: Never Smoker   . Smokeless tobacco: Never Used  . Alcohol Use: No  . Drug Use: No  . Sexual Activity: Not Currently   Other Topics Concern  . None   Social History Narrative   Additional Social History:    History of alcohol / drug use?: No history of alcohol / drug abuse                    Sleep: Fair  Appetite:  Poor  Current Medications: Current Facility-Administered Medications  Medication Dose Route Frequency Provider Last Rate Last Dose  . acetaminophen (TYLENOL) tablet 650 mg  650 mg Oral Q6H PRN [name], MD      . alum & mag hydroxide-simeth (MAALOX/MYLANTA) 200-200-20 MG/5ML suspension 30 mL  30 mL Oral Q4H PRN [name], MD      . feeding supplement (ENSURE ENLIVE) (ENSURE ENLIVE) liquid 237 mL  237 mL Oral TID BM Honestii Marton B Ankith Edmonston, MD   237 mL at 01/14/16 1335  . fluvoxaMINE (LUVOX) tablet 100 mg  100 mg Oral QHS Shari Prows, MD   100 mg at 01/13/16 2138  . magnesium hydroxide (MILK OF MAGNESIA) suspension 30 mL  30 mL Oral Daily PRN [name], MD   30 mL at 01/07/16  1458  . megestrol (MEGACE) 400 MG/10ML suspension 400 mg  400 mg Oral BID Clovis Fredrickson, MD   400 mg at 01/14/16 0852  . OLANZapine zydis (ZYPREXA) disintegrating tablet 10 mg  10 mg Oral QHS Clovis Fredrickson, MD   10 mg at 01/13/16 2138  . tamsulosin (FLOMAX) capsule 0.4 mg  0.4 mg Oral QPC supper Gonzella Lex, MD   0.4 mg at 01/13/16 1845  . traZODone (DESYREL) tablet 50 mg  50 mg Oral QHS PRN Gonzella Lex, MD        Lab Results:  Results for orders placed or performed during the hospital encounter of 01/04/16 (from the past 48 hour(s))  Basic metabolic panel     Status: Abnormal   Collection Time: 01/13/16  2:21 PM  Result Value Ref Range   Sodium 139 135 - 145 mmol/L   Potassium 4.2 3.5 - 5.1 mmol/L   Chloride 109 101 - 111 mmol/L   CO2 25 22 - 32 mmol/L   Glucose, Bld 140 (H) 65 - 99 mg/dL    BUN 24 (H) 6 - 20 mg/dL   Creatinine, Ser 1.26 (H) 0.61 - 1.24 mg/dL   Calcium 10.1 8.9 - 10.3 mg/dL   GFR calc non Af Amer 59 (L) >60 mL/min   GFR calc Af Amer >60 >60 mL/min    Comment: (NOTE) The eGFR has been calculated using the CKD EPI equation. This calculation has not been validated in all clinical situations. eGFR's persistently <60 mL/min signify possible Chronic Kidney Disease.    Anion gap 5 5 - 15  CBC     Status: None   Collection Time: 01/13/16  2:21 PM  Result Value Ref Range   WBC 9.5 3.8 - 10.6 K/uL   RBC 4.91 4.40 - 5.90 MIL/uL   Hemoglobin 15.4 13.0 - 18.0 g/dL   HCT 43.5 40.0 - 52.0 %   MCV 88.4 80.0 - 100.0 fL   MCH 31.4 26.0 - 34.0 pg   MCHC 35.5 32.0 - 36.0 g/dL   RDW 13.0 11.5 - 14.5 %   Platelets 158 150 - 440 K/uL    Blood Alcohol level:  Lab Results  Component Value Date   ETH <5 01/04/2016   ETH <5 12/15/2015    Physical Findings: AIMS: Facial and Oral Movements Muscles of Facial Expression: None, normal Lips and Perioral Area: None, normal Jaw: None, normal Tongue: None, normal,Extremity Movements Upper (arms, wrists, hands, fingers): None, normal Lower (legs, knees, ankles, toes): None, normal, Trunk Movements Neck, shoulders, hips: None, normal, Overall Severity Severity of abnormal movements (highest score from questions above): None, normal Incapacitation due to abnormal movements: None, normal Patient's awareness of abnormal movements (rate only patient's report): No Awareness, Dental Status Current problems with teeth and/or dentures?: Yes Does patient usually wear dentures?: No  CIWA:    COWS:     Musculoskeletal: Strength & Muscle Tone: decreased Gait & Station: unsteady Patient leans: N/A  Psychiatric Specialty Exam: Review of Systems  Constitutional: Positive for malaise/fatigue.  Neurological: Positive for weakness.  All other systems reviewed and are negative.   Blood pressure 118/77, pulse 61, temperature 98.3 F  (36.8 C), temperature source Oral, resp. rate 20, height _0  (1.753 m), weight 67.586 kg (149 lb), SpO2 99 %.Body mass index is 21.99 kg/(m^2).  General Appearance: Fairly Groomed  Engineer, water::  Minimal  Speech:  Slow  Volume:  Decreased  Mood:  Depressed, Hopeless and Worthless  Affect:  Blunt  Thought Process:  Goal Directed  Orientation:  Full (Time, Place, and Person)  Thought Content:  Delusions and Paranoid Ideation  Suicidal Thoughts:  Yes.  with intent/plan  Homicidal Thoughts:  No  Memory:  Immediate;   Fair Recent;   Fair Remote;   Fair  Judgement:  Fair  Insight:  Fair  Psychomotor Activity:  Psychomotor Retardation  Concentration:  Fair  Recall:  [employer] [place]  Language: Fair  Akathisia:  No  Handed:  Right  AIMS (if indicated):     Assets:  Communication Skills Desire for Improvement Financial Resources/Insurance Housing Resilience Social Support  ADL's:  Intact  Cognition: WNL  Sleep:  Number of Hours: 8.45   Treatment Plan Summary: Daily contact with patient to assess and evaluate symptoms and progress in treatment and Medication management   Dillon [name] is a 65 year old male with a history of anxiety and depression admitted for worsening of depression, malaise and severe weight loss in the context of recent major loss.  1. Suicidal ideation. The patient is still suicidal but unable to plan or execute any action. He is on 15 min checks.    2. Mood/anxietyand psychosis.Continue Luvox for depression and OCD and Zyprexa for psychosis.   3. Insomnia. He responded well to trazodone.   4. BPH. We continued Flomax.  5. Failure to thrive. We offered Megace and Ensure. Medicine input is greatly appreciated. Abdomen CT scan was unremarkable.  6. Cognitive decline. Psychological testing indicates unspecified neurocognitive disorder. Please see psychological consultation.  7. Metabolic syndrome monitoring. Lipid profile, TSH, and Hemoglobin A1c  are normal. Prolactin 29.5.  8. R/O Vitamin B12 deficiency. Folic acid and vitamin B12 are normal.  9. Deconditioning. PT consult is appreciated.   10. ECT. The patient does not have capacity to consent. The family is in the process of obtaining temporary guardianship. Dr. Weber Cooks is aware of this patient's needs. Chest X-ray is unremarkable.   18. Fall. Head CT scan and left knee x-ray were negative.   12. Disposition. The patient will be discharged to home with family. He will follow-up with [facility].    Orson Slick, MD 01/14/2016, 3:26 PM

## 2016-01-14 NOTE — Plan of Care (Signed)
Problem: Ineffective individual coping Goal: STG: Patient will remain free from self harm Outcome: Progressing Unmotivated to engage in rehab exercise during this shift, 15 minute checks maintained for safety, patient was reminded to use call bell to prompt for assist; clinical and moral support provided, patient encouraged to continue to express feelings and demonstrate safe care. Patient remain free from harm, will continue to monitor.

## 2016-01-14 NOTE — [facility] Group Notes (Signed)
[facility] Group Notes:  (Nursing/MHT/Case Management/Adjunct)  Date:  01/14/2016  Time:  2:39 PM  Type of Therapy:  Psychoeducational Skills  Participation Level:  Did Not Attend  Twanna Hymanda C Sydne Krahl 01/14/2016, 2:39 PM

## 2016-01-14 NOTE — Progress Notes (Signed)
Recreation Therapy Notes  Date: 03.30.17 Time: 3:00 pm Location: Craft Room  Group Topic: Leisure Education  Goal Area(s) Addresses:  Patient will identify activities for each letter of the alphabet. Patient will verbalize ability to integrate leisure into life post d/c. Patient will verbalize ability to use leisure as a Associate Professorcoping skill.  Behavioral Response: Did not attend  Intervention: Leisure Alphabet  Activity: Patients were given a leisure alphabet and were instructed to list a healthy leisure activity for each letter of the alphabet.  Education: LRT educated group on how they can integrate leisure in their schedule.  Education Outcome: Patient did not attend group.  Clinical Observations/Feedback: Patient did not attend group.  Jacquelynn CreeGreene,Meshilem Machuca M, LRT/CTRS 01/14/2016 4:15 PM

## 2016-01-14 NOTE — Plan of Care (Signed)
Problem: Ineffective individual coping Goal: STG: Pt will be able to identify effective and ineffective STG: Pt will be able to identify effective and ineffective coping patterns  Outcome: Not Progressing No group participation, encourage leg exercise

## 2016-01-15 NOTE — [facility] Group Notes (Signed)
[facility] LCSW Aftercare Discharge Planning Group Note  01/15/2016 9:30 AM  Participation Quality: Did Not Attend. Patient invited to participate but declined.   Dillon [name], MSW, LCSWA, LCAS   

## 2016-01-15 NOTE — Plan of Care (Signed)
Problem: Alteration in mood Goal: LTG-Pt's behavior demonstrates decreased signs of depression (Patient's behavior demonstrates decreased signs of depression to the point the patient is safe to return home and continue treatment in an outpatient setting)  Outcome: Not Progressing Patient maintains a flat depressed affect. He stays isolated to his room. Requires lots of encouragement.

## 2016-01-15 NOTE — Progress Notes (Signed)
Patient states that his mood is good. He got out of bed for meals only. Encouraged patient to attend groups, he said he'd try but laid in bed. Refilled his water pitcher and encouraged patient to stay hydrated.  Denies SI, AH and VH. Maintains a flat, depressed affect. Continues to utilize a wheelchair for mobility. No falls noted.

## 2016-01-15 NOTE — Progress Notes (Signed)
Recreation Therapy Notes  Date: 03.31.17 Time: 3:00 pm Location: Craft Room  Group Topic: Communication, Problem Solving, Teamwork  Goal Area(s) Addresses:  Patient will effectively work with peer towards shared goal. Patient will identify skills used to make activity successful. Patient will identify benefit of using group skills effectively post d/c.  Behavioral Response: Did not attend  Intervention: [employer] Tower  Activity: Patients were given 15 pipe cleaners and were instructed to build a free standing tower out of all 15 pipe cleaners. After approximately 3-5 minutes, patients were instructed to put their dominant hand behind their back. After another 3-5 minutes, patients were instructed to stop talking to their group.  Education: LRT educated patients on healthy support systems.  Education Outcome: Patient did not attend group.   Clinical Observations/Feedback: Patient did not attend group.  Jacquelynn CreeGreene,Faizan Geraci M, LRT/CTRS 01/15/2016 4:25 PM

## 2016-01-15 NOTE — Progress Notes (Signed)
[facility] MD Progress Note  01/15/2016 5:31 AM Dillon [name]  MRN:  [id number]  Subjective:  Mr. Cybulski is a 65 year old male with a history of OCD anxiety who was admitted to the hospital twice recently for severe, psychotic depression after his wife passed away. He's been in the hospital for almost 2 weeks and has been improving very slowly. We plan to do ECT beginning Monday. Psychosis, poor appetite and psychomotor retardation with the major problems as he refused to eat or drink or work with physical therapy. His appetite is slightly better. He still uses a wheelchair as there is a history of fall in the hospital.  Mr. Pavlik reports progress today. He explained that he is able to eat and drink more. He is still slumped in bed, prostrated, hardly able to answer questions. But he is better groomed and casually dressed today. He still sad and doesn't to live or die.  Principal Problem: Severe recurrent major depression without psychotic features ([place]) Diagnosis:   Patient Active Problem List   Diagnosis Date Noted  . Suicidal ideation [R45.851] 01/04/2016  . Severe recurrent major depression without psychotic features ([place]) [F33.2] 12/15/2015  . Grief [F43.21] 12/15/2015  . Weight loss [R63.4] 12/15/2015  . OCD (obsessive compulsive disorder) [F42.9] 12/15/2015  . Benign prostatic hyperplasia with urinary obstruction [N40.1] 11/24/2015  . 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  Past Medical History:  Past Medical History  Diagnosis Date  . Meningitis     spinal  . OCD (obsessive compulsive disorder)   . Kidney stones     Past Surgical History  Procedure Laterality Date  . Kidney stone surgery     Family History:  Family History  Problem Relation Age of Onset  . Cancer Mother     bladder  . Emphysema Father   . Emphysema Sister   . COPD Sister   . Cancer Brother      prostate   Family Psychiatric  History: none reported. Social History:  History  Alcohol Use No     History  Drug Use No    Social History   Social History  . Marital Status: Widowed    Spouse Name: N/A  . Number of Children: N/A  . Years of Education: N/A   Social History Main Topics  . Smoking status: Never Smoker   . Smokeless tobacco: Never Used  . Alcohol Use: No  . Drug Use: No  . Sexual Activity: Not Currently   Other Topics Concern  . None   Social History Narrative   Additional Social History:    History of alcohol / drug use?: No history of alcohol / drug abuse                    Sleep: Fair  Appetite:  Poor  Current Medications: Current Facility-Administered Medications  Medication Dose Route Frequency Provider Last Rate Last Dose  . acetaminophen (TYLENOL) tablet 650 mg  650 mg Oral Q6H PRN Gonzella Lex, MD      . alum & mag hydroxide-simeth (MAALOX/MYLANTA) 200-200-20 MG/5ML suspension 30 mL  30 mL Oral Q4H PRN Gonzella Lex, MD      . feeding supplement (ENSURE ENLIVE) (ENSURE ENLIVE) liquid 237 mL  237 mL Oral TID BM Jolanta B Pucilowska, MD   237 mL at 01/14/16 2000  . fluvoxaMINE (LUVOX) tablet 100 mg  100 mg Oral  QHS Clovis Fredrickson, MD   100 mg at 01/14/16 2120  . magnesium hydroxide (MILK OF MAGNESIA) suspension 30 mL  30 mL Oral Daily PRN Gonzella Lex, MD   30 mL at 01/07/16 1458  . megestrol (MEGACE) 400 MG/10ML suspension 400 mg  400 mg Oral BID Clovis Fredrickson, MD   400 mg at 01/14/16 2120  . OLANZapine zydis (ZYPREXA) disintegrating tablet 10 mg  10 mg Oral QHS Clovis Fredrickson, MD   10 mg at 01/14/16 2120  . tamsulosin (FLOMAX) capsule 0.4 mg  0.4 mg Oral QPC supper Gonzella Lex, MD   0.4 mg at 01/14/16 1917  . traZODone (DESYREL) tablet 50 mg  50 mg Oral QHS PRN Gonzella Lex, MD        Lab Results:  Results for orders placed or performed during the hospital encounter of 01/04/16 (from the past 48  hour(s))  Basic metabolic panel     Status: Abnormal   Collection Time: 01/13/16  2:21 PM  Result Value Ref Range   Sodium 139 135 - 145 mmol/L   Potassium 4.2 3.5 - 5.1 mmol/L   Chloride 109 101 - 111 mmol/L   CO2 25 22 - 32 mmol/L   Glucose, Bld 140 (H) 65 - 99 mg/dL   BUN 24 (H) 6 - 20 mg/dL   Creatinine, Ser 1.26 (H) 0.61 - 1.24 mg/dL   Calcium 10.1 8.9 - 10.3 mg/dL   GFR calc non Af Amer 59 (L) >60 mL/min   GFR calc Af Amer >60 >60 mL/min    Comment: (NOTE) The eGFR has been calculated using the CKD EPI equation. This calculation has not been validated in all clinical situations. eGFR's persistently <60 mL/min signify possible Chronic Kidney Disease.    Anion gap 5 5 - 15  CBC     Status: None   Collection Time: 01/13/16  2:21 PM  Result Value Ref Range   WBC 9.5 3.8 - 10.6 K/uL   RBC 4.91 4.40 - 5.90 MIL/uL   Hemoglobin 15.4 13.0 - 18.0 g/dL   HCT 43.5 40.0 - 52.0 %   MCV 88.4 80.0 - 100.0 fL   MCH 31.4 26.0 - 34.0 pg   MCHC 35.5 32.0 - 36.0 g/dL   RDW 13.0 11.5 - 14.5 %   Platelets 158 150 - 440 K/uL    Blood Alcohol level:  Lab Results  Component Value Date   ETH <5 01/04/2016   ETH <5 12/15/2015    Physical Findings: AIMS: Facial and Oral Movements Muscles of Facial Expression: None, normal Lips and Perioral Area: None, normal Jaw: None, normal Tongue: None, normal,Extremity Movements Upper (arms, wrists, hands, fingers): None, normal Lower (legs, knees, ankles, toes): None, normal, Trunk Movements Neck, shoulders, hips: None, normal, Overall Severity Severity of abnormal movements (highest score from questions above): None, normal Incapacitation due to abnormal movements: None, normal Patient's awareness of abnormal movements (rate only patient's report): No Awareness, Dental Status Current problems with teeth and/or dentures?: Yes Does patient usually wear dentures?: No  CIWA:    COWS:     Musculoskeletal: Strength & Muscle Tone: decreased Gait  & Station: unsteady Patient leans: N/A  Psychiatric Specialty Exam: Review of Systems  Constitutional: Positive for malaise/fatigue.  Psychiatric/Behavioral: Positive for depression and suicidal ideas.  All other systems reviewed and are negative.   Blood pressure 118/77, pulse 61, temperature 98.3 F (36.8 C), temperature source Oral, resp. rate 20, height 5' 9"  (1.753 m),  weight 67.586 kg (149 lb), SpO2 99 %.Body mass index is 21.99 kg/(m^2).  General Appearance: Fairly Groomed  Engineer, water::  Minimal  Speech:  Slow  Volume:  Decreased  Mood:  Depressed, Hopeless and Worthless  Affect:  Blunt  Thought Process:  Goal Directed  Orientation:  Full (Time, Place, and Person)  Thought Content:  Delusions and Paranoid Ideation  Suicidal Thoughts:  No  Homicidal Thoughts:  No  Memory:  Immediate;   Fair Recent;   Fair Remote;   Fair  Judgement:  Fair  Insight:  Fair  Psychomotor Activity:  Psychomotor Retardation  Concentration:  Fair  Recall:  [employer] [place]  Language: Fair  Akathisia:  No  Handed:  Right  AIMS (if indicated):     Assets:  Communication Skills Desire for Improvement Financial Resources/Insurance Housing Resilience Social Support  ADL's:  Intact  Cognition: WNL  Sleep:  Number of Hours: 8.45   Treatment Plan Summary: Daily contact with patient to assess and evaluate symptoms and progress in treatment and Medication management   Mr. Molner is a 65 year old male with a history of anxiety and depression admitted for worsening of depression, malaise and severe weight loss in the context of recent major loss.  1. Suicidal ideation. The patient is passively suicidal unable to function. He is on 15 min checks.    2. Mood/anxietyand psychosis.Continue Luvox for depression and OCD and Zyprexa for psychosis.   3. Insomnia. He responded well to trazodone.   4. BPH. We continued Flomax.  5. Failure to thrive. We offered Megace and Ensure.  Medicine input is greatly appreciated. Abdomen CT scan was unremarkable.  6. Cognitive decline. Psychological testing indicates unspecified neurocognitive disorder. Please see psychological consultation.  7. Metabolic syndrome monitoring. Lipid profile, TSH, and Hemoglobin A1c are normal. Prolactin 29.5.  8. R/O Vitamin B12 deficiency. Folic acid and vitamin B12 are normal.  9. Deconditioning. PT help is appreciated.   10. ECT. The patient now agrees to ECT treatment. Dr. Weber Cooks is aware of this patient's needs. Chest X-ray is unremarkable. EKG pending.  67. Fall. Head CT scan and left knee x-ray were negative.   12. Disposition. The patient will be discharged to home with family. He will follow-up with [facility].    Orson Slick, MD 01/15/2016, 5:31 AM

## 2016-01-15 NOTE — Progress Notes (Signed)
Patient had snack and bedtime medications in his room, body alarm for falls re-attached to shirt, urinal noted near the bed and at reach; patient was reminded to use call bell to prompt for bathroom assist. Denied pain, denied SI/SIB/HI, denied AV/H. Anticipated NPO on Sunday night for ECT on Monday morning discussed; will continue with POC.

## 2016-01-15 NOTE — [facility] Group Notes (Signed)
[facility] LCSW Group Therapy   01/15/2016 1pm  Type of Therapy: Group Therapy   Participation Level: Did Not Attend. Patient invited to participate but declined.    Danean Marner F. Lenisha Lacap, MSW, LCSWA, LCAS   

## 2016-01-15 NOTE — Progress Notes (Signed)
Physical Therapy Treatment Patient Details Name: Dillon [name] MRN: [id number] DOB: 08/10/1951 Today's Date: 01/15/2016    History of Present Illness The patient has a long history of anxiety with OCD type of symptoms that went mostly untreated. He became severely depressed and anxious after his wife passed away in April 2016. He lost 35 pounds initially. He was hospitalized at [facility] in March and discharged 10 days ago. He has not been taking any medications at home, he has not been eating or drinking fluids. He did not follow-up with his appointments, grief counseling or senior center. He lost 16 pounds in the past 10 days. He lost interest in food and no longer feels hungry. He also has abdominal discomfort. He does not eat because he is afraid it will making sick. He reports poor sleep, decreased appetite, anhedonia, hopelessness worthlessness, poor energy and lack of interest, social isolation, crying spells, worsening of anxiety, and suicidal ideation. The patient has been trying to deal with it the best he could but has been struggling recently. He denies psychotic symptoms. He denies symptoms suggestive of bipolar disorder. He does not use drugs or alcohol. Pt was admitted to behavioral medicine unit at [facility] for depressive episode with suicidal ideation. Pt is pleasant upon arrival with physical therapist although feels generally weak and low on energy. He reports full independence with ADLs/IADLs prior to arrival. He typically does not ambulate with an assistive device and denies any falls over the last 12 months.     PT Comments    Pt is progressing towards goals. PT session limited by patient's fatigue. Pt able to perform bed mobility to EOB independently. Pt able to transfer from sit-to-stand from EOB with CGA for safety. Pt able to ambulate with hand-held assist down hallway approx. 180 ft. Followed behind pt with WC t/o ambulation. Pt no longer orthostatic (BP  116/79 standing per RN), therefore able to better perform mobility. Pt very fatigued after ambulation. Pt performed sitting and supine there-ex with no assist. Pt demonstrated some compensation w/ther-ex as he became fatigued. Pt initially uninterested in performing therapy but able to convince him to participate. Pt still demonstrates deficits in strength, ROM, mobility, and balance.  Pt would benefit from further skilled therapy to address deficits; recommend pt receive home health PT after discharge from acute hospitalization.   Follow Up Recommendations  Home health PT     Equipment Recommendations       Recommendations for Other Services       Precautions / Restrictions Precautions Precautions: Fall Restrictions Weight Bearing Restrictions: No    Mobility  Bed Mobility Overal bed mobility: Independent             General bed mobility comments: Pt demonstrates good speed and sequencing when transitioning from sidelying to sitting  Transfers Overall transfer level: Modified independent Equipment used: None Transfers: Sit to/from Stand Sit to Stand: Min guard         General transfer comment: Pt able to perfrom sit to stand from EOB with CGA for safety. Pt demonstrates improved ability to stand compared to last visit.   Ambulation/Gait Ambulation/Gait assistance: Min guard Ambulation Distance (Feet): 180 Feet Assistive device: None Gait Pattern/deviations: Step-through pattern     General Gait Details: Pt able to ambulate using hand held assist. Pt demonstrated slow gait speed with step-through pattern. Pts trunk flexed slighly during ambulation. Pt stated felt a little dizzy but fine to walk. Followed behind with WC for safety.  Pt expresed feeling very fatigued after ambulation.    Stairs            Wheelchair Mobility    Modified Rankin (Stroke Patients Only)       Balance                                    Cognition  Arousal/Alertness: Awake/alert Behavior During Therapy: WFL for tasks assessed/performed Overall Cognitive Status: Within Functional Limits for tasks assessed                      Exercises Other Exercises Other Exercises: Pt performed seated ther-ex on B LE including marching and LAQ with no assist. Pt demonstrated compensation with posterior trunk lean with R LAQ. Pt performed SLR in supine on B LE w/o assist. All ther-ex performed x10 reps.      General Comments        Pertinent Vitals/Pain Pain Assessment: Faces Faces Pain Scale: Hurts little more Pain Intervention(s): Limited activity within patient's tolerance    Home Living                      Prior Function            PT Goals (current goals can now be found in the care plan section) Acute Rehab PT Goals PT Goal Formulation: Patient unable to participate in goal setting Progress towards PT goals: Progressing toward goals    Frequency  Min 2X/week    PT Plan Current plan remains appropriate    Co-evaluation             End of Session Equipment Utilized During Treatment: Gait belt Activity Tolerance: Patient limited by fatigue Patient left: in bed     Time: 9629-5284 PT Time Calculation (min) (ACUTE ONLY): 13 min  Charges:                       G Codes:      Dorita Fray Jan 20, 2016, 9:45 AM M. Hettie Holstein, SPT

## 2016-01-16 LAB — BASIC METABOLIC PANEL WITH GFR
Anion gap: 8 (ref 5–15)
BUN: 30 mg/dL — ABNORMAL HIGH (ref 6–20)
CO2: 22 mmol/L (ref 22–32)
Calcium: 9.5 mg/dL (ref 8.9–10.3)
Chloride: 109 mmol/L (ref 101–111)
Creatinine, Ser: 1.24 mg/dL (ref 0.61–1.24)
GFR calc Af Amer: >60 mL/min
GFR calc non Af Amer: >60 mL/min
Glucose, Bld: 186 mg/dL — ABNORMAL HIGH (ref 65–99)
Potassium: 4.1 mmol/L (ref 3.5–5.1)
Sodium: 139 mmol/L (ref 135–145)

## 2016-01-16 NOTE — Plan of Care (Signed)
Problem: Alteration in mood Goal: LTG-Patient reports reduction in suicidal thoughts (Patient reports reduction in suicidal thoughts and is able to verbalize a safety plan for whenever patient is feeling suicidal)  Outcome: Progressing Pt denies SI     

## 2016-01-16 NOTE — Progress Notes (Signed)
D:  Patient is alert and oriented on the unit this shift.  Patient did not attend groups today.  Patient denies suicidal ideation, homicidal ideation, auditory or visual hallucinations at the present time.  Patient goes to the dayroom for his meals this shift. A:  Scheduled medications are administered to patient as per MD orders.  Emotional support and encouragement are provided.  Patient is maintained on q.15 minute safety checks.  Patient is informed to notify staff with questions or concerns. R:  No adverse medication reactions are noted.  Patient is cooperative with medication administration and treatment plan today.  Patient is receptive, calm and cooperative on the unit at this time.  Patient does not interact with others on the unit this shift.  Patient contracts for safety at this time.  Patient remains safe at this time.

## 2016-01-16 NOTE — [facility] Group Notes (Signed)
[facility] Group Notes:  (Nursing/MHT/Case Management/Adjunct)  Date:  01/16/2016  Time:  9:18 AM  Type of Therapy:  Community   Participation Level:  Did Not Attend  Kynlei Piontek De'Chelle Leani Myron 01/16/2016, 9:18 AM

## 2016-01-16 NOTE — [facility] Group Notes (Signed)
[facility] LCSW Group Therapy  01/16/2016 3:10 PM  Type of Therapy:  Group Therapy  Participation Level:  Did Not Attend  Modes of Intervention:  Discussion, Education, Socialization and Support  Summary of Progress/Problems: Pt will identify unhealthy thoughts and how they impact their emotions and behavior. Pt will be encouraged to discuss these thoughts, emotions and behaviors with the group.   Dillon [name] L Lorrin Nawrot MSW, LCSWA  01/16/2016, 3:10 PM  

## 2016-01-16 NOTE — Plan of Care (Signed)
Problem: Alteration in mood Goal: STG-Patient is able to discuss feelings and issues (Patient is able to discuss feelings and issues leading to depression)  Outcome: Not Progressing Patient is not willing to discuss his thoughts and feelings in depth currently

## 2016-01-16 NOTE — Progress Notes (Addendum)
[facility] MD Progress Note  01/16/2016 3:16 PM Dillon [name]  MRN:  [id number]  Subjective:    Dillon [name] is still using a wheelchair secondary to feeling lightheaded and weak. He slept fairly well last night and appetite has been slowly increasing. There has been only mild improvement in terms of his mood. He does report depressive symptoms since his wife passed one year ago and says that grief share was not significantly helpful. He has not had any individual therapy or grief counseling other than grief share. He denies any current active or passive suicidal thoughts or psychotic symptoms. The patient has been compliant with medications and no behavioral disturbances. He has not been attending groups and has been fairly isolative. He does still have some lightheadedness and dizziness upon standing. Blood pressure and vital signs have been stable. We'll check orthostatics. He denies any physical adverse side effects associated with the medications. Supportive psychotherapy provided and times spent discussing the stages of grief and times spent in grief counseling. He was encouraged to retry grief share.  Past psychiatric history.  He was hospitalized once recently at [facility] inpatient psychiat[facility]. He was tried on Remeron, Luvox and Trazodone. There were no suicide attempts. He has diagnoses of OCD and major depression. The patient denies that he is followed by a psychiatrist as an outpatient.  Family psychiatric history. Dillon [name] with mental illness mostly anxiety and depression.  Social history. He retired in 01-17-15.Marland Kitchen His wife passed away in 2023-02-17. He lives by himself. He has 2 adult children and grandchildren nearby but the patient has very little interest in interacting with his family even though he loves them dearly. He experiences some financial difficulties. He has not been eating even though his daughter brings him food everyday.  Legal history:  The patient denies  any prior arrest or incarcerations.    Principal Problem: Severe recurrent major depression without psychotic features ([place]) Diagnosis:   Patient Active Problem List   Diagnosis Date Noted  . Suicidal ideation [R45.851] 01/04/2016  . Severe recurrent major depression without psychotic features ([place]) [F33.2] 12/15/2015  . Grief [F43.21] 12/15/2015  . Weight loss [R63.4] 12/15/2015  . OCD (obsessive compulsive disorder) [F42.9] 12/15/2015  . Benign prostatic hyperplasia with urinary obstruction [N40.1] 11/24/2015  . 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  Past Medical History:  Past Medical History  Diagnosis Date  . Meningitis     spinal  . OCD (obsessive compulsive disorder)   . Kidney stones     Past Surgical History  Procedure Laterality Date  . Kidney stone surgery     Family History:  Family History  Problem Relation Age of Onset  . Cancer Mother     bladder  . Emphysema Dillon [name]   . Emphysema Sister   . COPD Sister   . Cancer Brother     prostate    Social History:  History  Alcohol Use No     History  Drug Use No    Social History   Social History  . Marital Status: Widowed    Spouse Name: N/A  . Number of Children: N/A  . Years of Education: N/A   Social History Main Topics  . Smoking status: Never Smoker   . Smokeless tobacco: Never Used  . Alcohol Use: No  . Drug Use: No  . Sexual Activity: Not Currently   Other  Topics Concern  . None   Social History Narrative   Additional Social History:    History of alcohol / drug use?: No history of alcohol / drug abuse                  Sleep: Fair  Appetite:  Improved  Current Medications: Current Facility-Administered Medications  Medication Dose Route Frequency Provider Last Rate Last Dose  . acetaminophen (TYLENOL) tablet 650 mg  650 mg Oral Q6H PRN Gonzella Lex,  MD      . alum & mag hydroxide-simeth (MAALOX/MYLANTA) 200-200-20 MG/5ML suspension 30 mL  30 mL Oral Q4H PRN Gonzella Lex, MD      . feeding supplement (ENSURE ENLIVE) (ENSURE ENLIVE) liquid 237 mL  237 mL Oral TID BM Quinlan Vollmer B Renna Kilmer, MD   237 mL at 01/16/16 1508  . fluvoxaMINE (LUVOX) tablet 100 mg  100 mg Oral QHS Tyashia Morrisette B Kyley Solow, MD   100 mg at 01/15/16 2254  . magnesium hydroxide (MILK OF MAGNESIA) suspension 30 mL  30 mL Oral Daily PRN Gonzella Lex, MD   30 mL at 01/07/16 1458  . megestrol (MEGACE) 400 MG/10ML suspension 400 mg  400 mg Oral BID Clovis Fredrickson, MD   400 mg at 01/16/16 0907  . OLANZapine zydis (ZYPREXA) disintegrating tablet 10 mg  10 mg Oral QHS Clovis Fredrickson, MD   10 mg at 01/15/16 2253  . tamsulosin (FLOMAX) capsule 0.4 mg  0.4 mg Oral QPC supper Gonzella Lex, MD   0.4 mg at 01/15/16 1738  . traZODone (DESYREL) tablet 50 mg  50 mg Oral QHS PRN Gonzella Lex, MD        Lab Results:  Results for orders placed or performed during the hospital encounter of 01/04/16 (from the past 48 hour(s))  Basic metabolic panel     Status: Abnormal   Collection Time: 01/16/16  2:30 PM  Result Value Ref Range   Sodium 139 135 - 145 mmol/L   Potassium 4.1 3.5 - 5.1 mmol/L   Chloride 109 101 - 111 mmol/L   CO2 22 22 - 32 mmol/L   Glucose, Bld 186 (H) 65 - 99 mg/dL   BUN 30 (H) 6 - 20 mg/dL   Creatinine, Ser 1.24 0.61 - 1.24 mg/dL   Calcium 9.5 8.9 - 10.3 mg/dL   GFR calc non Af Amer >60 >60 mL/min   GFR calc Af Amer >60 >60 mL/min    Comment: (NOTE) The eGFR has been calculated using the CKD EPI equation. This calculation has not been validated in all clinical situations. eGFR's persistently <60 mL/min signify possible Chronic Kidney Disease.    Anion gap 8 5 - 15    Blood Alcohol level:  Lab Results  Component Value Date   ETH <5 01/04/2016   ETH <5 12/15/2015    Physical Findings: AIMS: Facial and Oral Movements Muscles of Facial  Expression: None, normal Lips and Perioral Area: None, normal Jaw: None, normal Tongue: None, normal,Extremity Movements Upper (arms, wrists, hands, fingers): None, normal Lower (legs, knees, ankles, toes): None, normal, Trunk Movements Neck, shoulders, hips: None, normal, Overall Severity Severity of abnormal movements (highest score from questions above): None, normal Incapacitation due to abnormal movements: None, normal Patient's awareness of abnormal movements (rate only patient's report): No Awareness, Dental Status Current problems with teeth and/or dentures?: Yes Does patient usually wear dentures?: No   Musculoskeletal: Strength & Muscle Tone: decreased Gait & Station: unsteady Patient  leans: N/A  Psychiatric Specialty Exam: Review of Systems  Constitutional: Positive for weight loss and malaise/fatigue. Negative for fever, chills and diaphoresis.  HENT: Negative for ear discharge, ear pain, hearing loss, nosebleeds and tinnitus.        Lightheadedness and dizziness  Eyes: Negative for blurred vision, double vision, photophobia and pain.  Respiratory: Negative for cough, hemoptysis, sputum production, shortness of breath and wheezing.   Cardiovascular: Negative for chest pain, palpitations, orthopnea and leg swelling.  Gastrointestinal: Negative.  Negative for heartburn, nausea, vomiting, abdominal pain, diarrhea, constipation and blood in stool.  Genitourinary: Negative.  Negative for dysuria, urgency and frequency.  Musculoskeletal: Positive for falls. Negative for myalgias, back pain, joint pain and neck pain.  Skin: Negative for itching and rash.  Neurological: Positive for dizziness and weakness. Negative for tingling, tremors, sensory change, speech change, focal weakness, seizures, loss of consciousness and headaches.  Endo/Heme/Allergies: Negative.  Negative for environmental allergies. Does not bruise/bleed easily.  Psychiatric/Behavioral: Positive for depression and  suicidal ideas.  All other systems reviewed and are negative.   Blood pressure 119/83, pulse 83, temperature 97.9 F (36.6 C), temperature source Oral, resp. rate 20, height 5' 9"  (1.753 m), weight 67.586 kg (149 lb), SpO2 99 %.Body mass index is 21.99 kg/(m^2).  General Appearance: Fairly Groomed  Engineer, water::  Minimal  Speech:  Slow  Volume:  Decreased  Mood:  Depressed, Hopeless and Worthless  Affect:  Blunt  Thought Process:  Goal Directed  Orientation:  Full (Time, Place, and Person)  Thought Content:  Delusions and Paranoid Ideation  Suicidal Thoughts:  No  Homicidal Thoughts:  No  Memory:  Immediate;   Fair Recent;   Fair Remote;   Fair  Judgement:  Fair  Insight:  Fair  Psychomotor Activity:  Psychomotor Retardation  Concentration:  Fair  Recall:  [employer] [place]  Language: Fair  Akathisia:  No  Handed:  Right  AIMS (if indicated):     Assets:  Communication Skills Desire for Improvement Financial Resources/Insurance Housing Resilience Social Support  ADL's:  Intact  Cognition: WNL  Sleep:  Number of Hours: 8.25   Treatment Plan Summary: Daily contact with patient to assess and evaluate symptoms and progress in treatment and Medication management   Mr. Perales is a 65 year old male with a history of anxiety and depression admitted for worsening of depression, malaise and severe weight loss in the context of recent major loss.  1. Major Depression with possible Psychosis: The patient is a longer having any suicidal thoughts. He is however still severely depressed. Appetite is slowly improving. We'll plan to continue on Luvox 100 mg by mouth daily for depression as well as Zyprexa 10 mg by mouth nightly as an adjunct. Consider changing Zyprexa to Abilify given orthostatic hypotension. He is on 15 min checks.    2. Insomnia. He responded well to trazodone.   3. BPH. We continued Flomax.  4. Failure to thrive. We offered Megace and Ensure. Medicine  input is greatly appreciated. Abdomen CT scan was unremarkable. The patient does have orthostatic hypotension. We'll push by mouth fluids and continue to monitor vital signs.  5. Cognitive decline. Psychological testing indicates unspecified neurocognitive disorder. Please see psychological consultation.  6. Metabolic syndrome monitoring. Lipid profile, TSH, and Hemoglobin A1c are normal. Prolactin 29.5.  7. R/O Vitamin B12 deficiency. Folic acid and vitamin B12 are normal.  8. Deconditioning. PT help is appreciated.   9. ECT. The patient now agrees to ECT treatment.  Dr. Weber Cooks is aware of this patient's needs. Chest X-ray is unremarkable. EKG pending.  41. Fall. Head CT scan and left knee x-ray were negative.   11. Disposition. The patient will be discharged to home with family. He will follow-up with [facility].   I certify that the services received since the previous certification/recertification were and continue to be medically necessary as the treatment provided can be reasonably expected to improve the patient's condition; the medical record documents that the services furnished were intensive treatment services or their equivalent services, and this patient continues to need, on a daily basis, active treatment furnished directly by or requiring the supervision of inpatient psychiatric personnel.     Jay Schlichter, MD 01/16/2016, 3:16 PM

## 2016-01-16 NOTE — Progress Notes (Signed)
D: Observed pt in room. Patient alert and oriented x4. Patient denies SI/HI/AVH. Pt affect is flat. Pt isolated to room all evening. When asked about progress on the unit  Pt stated "I've been getting more sleep...appetite is better." Pt also endorsed "trying to be more careful after the fall." Pt rated depression 4/10 and anxiety 4/10, and stated "seems like it's better." Pt interacts minimally and forwards little.  A: Offered active listening and support. Provided therapeutic communication. Administered scheduled medications. Encouraged pt to attend group and discussed the benefits of leaving room and socializing more.  R: Pt stated "I'll give it thought" in regards to attending group, but was very noncommittal. Pt pleasant and cooperative. Pt asked for snack and drank whole ensure. Pt medication compliant. Will continue Q15 min. checks. Safety maintained.

## 2016-01-16 NOTE — Plan of Care (Signed)
Problem: Alteration in mood Goal: LTG-Pt's behavior demonstrates decreased signs of depression (Patient's behavior demonstrates decreased signs of depression to the point the patient is safe to return home and continue treatment in an outpatient setting)  Outcome: Progressing Patient is slightly brighter and conversational currently

## 2016-01-17 MED ORDER — OLANZAPINE 5 MG PO TBDP
5.0000 mg | ORAL_TABLET | Freq: Every day | ORAL | Status: DC
  Administered 2016-01-17 – 2016-01-28 (×12): 5 mg via ORAL
  Filled 2016-01-17 (×12): qty 1

## 2016-01-17 NOTE — Plan of Care (Signed)
Problem: Ineffective individual coping Goal: STG: Patient will remain free from self harm Outcome: Progressing Pt has remained free from self harm     

## 2016-01-17 NOTE — [facility] Group Notes (Signed)
[facility] LCSW Group Therapy  01/17/2016 3:17 PM  Type of Therapy:  Group Therapy  Participation Level:  Did Not Attend  Modes of Intervention:  Discussion, Education, Socialization and Support  Summary of Progress/Problems: Self esteem: Patients discussed self esteem and how it impacts them. They discussed what aspects in their lives has influenced their self esteem. They were challenged to identify changes that are needed in order to improve self esteem.    Roisin Mones L Bruno Leach MSW, LCSWA  01/17/2016, 3:17 PM  

## 2016-01-17 NOTE — Progress Notes (Signed)
D: Observed pt in room. Patient alert and oriented x4. Patient denies SI/HI/AVH. Pt affect is flat. Pt isolated to room all evening, except to come out to eat snack in the day room. When asked about progress pt stated "I'm tickled that my eating's picked up." Pt rated depression 4/10 and anxiety 4/10." Pt interacts minimally and forwards little. Pt reports no changes since the previous night. Pt orthostatic BP was low standing, pt reported feeling slightly light headed. Pt felt better after sitting down A: Offered active listening and support. Provided therapeutic communication. Administered scheduled medications. Encouraged pt to attend group and discussed the benefits of leaving room and socializing more. Encouraged pt to increase fluid intake. R: Pt stated "maybe tomorrow" in regards to attending group, but was very noncommittal. Pt pleasant and cooperative. Pt asked for snack and drank whole ensure. Pt medication compliant. Will continue Q15 min. checks. Safety maintained.

## 2016-01-17 NOTE — Progress Notes (Addendum)
Patient orthostatic vital signs performed as per protocol with results recorded and MD aware. Med changes considered. Safety maintained. Remains on falls protocol, supervision with transfer to and from wheelchair to maintain safety. Patient remains with sad affect, quiet speech, fair eye contact. No SI/HI at this time. Minimal interaction with peers. Verbalizes needs appropriately to staff. Attends outside therapy group for fresh air and sunshine. NPO for ECT tomorrow.

## 2016-01-17 NOTE — [facility] Group Notes (Signed)
[facility] Group Notes:  (Nursing/MHT/Case Management/Adjunct)  Date:  01/17/2016  Time:  2:57 AM  Type of Therapy:  Group Therapy  Participation Level:  Did Not Attend    Summary of Progress/Problems:  Dillon [name] 01/17/2016, 2:57 AM

## 2016-01-17 NOTE — Plan of Care (Signed)
Problem: Consults Goal: Depression Patient Education See Patient Education Module for education specifics.  Outcome: Progressing Remains with sad affect, increased appetite noted, shakes monitored and recorded and encouraged to attend outside therapy group.

## 2016-01-17 NOTE — Progress Notes (Signed)
[facility] MD Progress Note  01/17/2016 11:31 AM Madelaine Etienne  MRN:  [id number]  Subjective:    The patient reports that he is slowly starting to feel a little better in terms of his mood and appetite has definitely increased. He is art he looking forward to lunch even though it is 11:30 AM. The patient says he has not had any recent problems with suicidal thoughts or psychotic symptoms. He does still have high levels of anxiety and says he worries about "everything". He says he specifically worries about something going wrong or something bad happening. He feels a sense of dread and doom. He denies any overt paranoid thoughts however. The patient has been fairly isolative to his room other than for meals. He still is using a wheelchair secondary to difficulty with ambulation and has significant psychomotor retardation. The patient denies any problems with insomnia and slept fairly well last night. Other than the lightheadedness, he denies any other side effects associated with psychotropic medications.  Past psychiatric history.  He was hospitalized once recently at [facility] inpatient psychiatry. He was tried on Remeron, Luvox and Trazodone. There were no suicide attempts. He has diagnoses of OCD and major depression. The patient denies that he is followed by a psychiatrist as an outpatient.  Family psychiatric history. Father with mental illness mostly anxiety and depression.  Social history. He retired in 01-18-2015.Marland Kitchen His wife passed away in Feb 18, 2023. He lives by himself. He has 2 adult children and grandchildren nearby but the patient has very little interest in interacting with his family even though he loves them dearly. He experiences some financial difficulties. He has not been eating even though his daughter brings him food everyday.  Legal history:  The patient denies any prior arrest or incarcerations.    Principal Problem: Severe recurrent major depression without  psychotic features ([place]) Diagnosis:   Patient Active Problem List   Diagnosis Date Noted  . Suicidal ideation [R45.851] 01/04/2016  . Severe recurrent major depression without psychotic features ([place]) [F33.2] 12/15/2015  . Grief [F43.21] 12/15/2015  . Weight loss [R63.4] 12/15/2015  . OCD (obsessive compulsive disorder) [F42.9] 12/15/2015  . Benign prostatic hyperplasia with urinary obstruction [N40.1] 11/24/2015  . 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  Past Medical History:  Past Medical History  Diagnosis Date  . Meningitis     spinal  . OCD (obsessive compulsive disorder)   . Kidney stones     Past Surgical History  Procedure Laterality Date  . Kidney stone surgery     Family History:  Family History  Problem Relation Age of Onset  . Cancer Mother     bladder  . Emphysema Father   . Emphysema Sister   . COPD Sister   . Cancer Brother     prostate    Social History:  History  Alcohol Use No     History  Drug Use No    Social History   Social History  . Marital Status: Widowed    Spouse Name: N/A  . Number of Children: N/A  . Years of Education: N/A   Social History Main Topics  . Smoking status: Never Smoker   . Smokeless tobacco: Never Used  . Alcohol Use: No  . Drug Use: No  . Sexual Activity: Not Currently   Other Topics Concern  . None   Social History Narrative  Additional Social History:    History of alcohol / drug use?: No history of alcohol / drug abuse                  Sleep: Fair  Appetite:  Improved  Current Medications: Current Facility-Administered Medications  Medication Dose Route Frequency Provider Last Rate Last Dose  . acetaminophen (TYLENOL) tablet 650 mg  650 mg Oral Q6H PRN Gonzella Lex, MD      . alum & mag hydroxide-simeth (MAALOX/MYLANTA) 200-200-20 MG/5ML suspension 30 mL  30 mL Oral  Q4H PRN Gonzella Lex, MD      . feeding supplement (ENSURE ENLIVE) (ENSURE ENLIVE) liquid 237 mL  237 mL Oral TID BM Jolanta B Pucilowska, MD   237 mL at 01/17/16 1031  . fluvoxaMINE (LUVOX) tablet 100 mg  100 mg Oral QHS Jolanta B Pucilowska, MD   100 mg at 01/16/16 2244  . magnesium hydroxide (MILK OF MAGNESIA) suspension 30 mL  30 mL Oral Daily PRN Gonzella Lex, MD   30 mL at 01/07/16 1458  . megestrol (MEGACE) 400 MG/10ML suspension 400 mg  400 mg Oral BID Clovis Fredrickson, MD   400 mg at 01/17/16 0922  . OLANZapine zydis (ZYPREXA) disintegrating tablet 5 mg  5 mg Oral QHS Chauncey Mann, MD      . tamsulosin [facility]) capsule 0.4 mg  0.4 mg Oral QPC supper Gonzella Lex, MD   0.4 mg at 01/16/16 1714  . traZODone (DESYREL) tablet 50 mg  50 mg Oral QHS PRN Gonzella Lex, MD        Lab Results:  Results for orders placed or performed during the hospital encounter of 01/04/16 (from the past 48 hour(s))  Basic metabolic panel     Status: Abnormal   Collection Time: 01/16/16  2:30 PM  Result Value Ref Range   Sodium 139 135 - 145 mmol/L   Potassium 4.1 3.5 - 5.1 mmol/L   Chloride 109 101 - 111 mmol/L   CO2 22 22 - 32 mmol/L   Glucose, Bld 186 (H) 65 - 99 mg/dL   BUN 30 (H) 6 - 20 mg/dL   Creatinine, Ser 1.24 0.61 - 1.24 mg/dL   Calcium 9.5 8.9 - 10.3 mg/dL   GFR calc non Af Amer >60 >60 mL/min   GFR calc Af Amer >60 >60 mL/min    Comment: (NOTE) The eGFR has been calculated using the CKD EPI equation. This calculation has not been validated in all clinical situations. eGFR's persistently <60 mL/min signify possible Chronic Kidney Disease.    Anion gap 8 5 - 15    Blood Alcohol level:  Lab Results  Component Value Date   ETH <5 01/04/2016   ETH <5 12/15/2015    Physical Findings: AIMS: Facial and Oral Movements Muscles of Facial Expression: None, normal Lips and Perioral Area: None, normal Jaw: None, normal Tongue: None, normal,Extremity Movements Upper (arms,  wrists, hands, fingers): None, normal Lower (legs, knees, ankles, toes): None, normal, Trunk Movements Neck, shoulders, hips: None, normal, Overall Severity Severity of abnormal movements (highest score from questions above): None, normal Incapacitation due to abnormal movements: None, normal Patient's awareness of abnormal movements (rate only patient's report): No Awareness, Dental Status Current problems with teeth and/or dentures?: Yes Does patient usually wear dentures?: No   Musculoskeletal: Strength & Muscle Tone: decreased Gait & Station: unsteady Patient leans: N/A  Psychiatric Specialty Exam: Review of Systems  Constitutional: Positive for weight loss  and malaise/fatigue. Negative for fever, chills and diaphoresis.  HENT: Negative for ear discharge, ear pain, hearing loss, nosebleeds and tinnitus.        Lightheadedness and dizziness  Eyes: Negative for blurred vision, double vision, photophobia and pain.  Respiratory: Negative for cough, hemoptysis, sputum production, shortness of breath and wheezing.   Cardiovascular: Negative for chest pain, palpitations, orthopnea and leg swelling.  Gastrointestinal: Negative.  Negative for heartburn, nausea, vomiting, abdominal pain, diarrhea, constipation and blood in stool.  Genitourinary: Negative.  Negative for dysuria, urgency and frequency.  Musculoskeletal: Positive for falls. Negative for myalgias, back pain, joint pain and neck pain.  Skin: Negative for itching and rash.  Neurological: Positive for dizziness and weakness. Negative for tingling, tremors, sensory change, speech change, focal weakness, seizures, loss of consciousness and headaches.  Endo/Heme/Allergies: Negative.  Negative for environmental allergies. Does not bruise/bleed easily.  Psychiatric/Behavioral: Positive for depression and suicidal ideas.  All other systems reviewed and are negative.   Blood pressure 119/83, pulse 83, temperature 97.9 F (36.6 C),  temperature source Oral, resp. rate 20, height 5' 9"  (1.753 m), weight 67.586 kg (149 lb), SpO2 99 %.Body mass index is 21.99 kg/(m^2).  General Appearance: Fairly Groomed  Engineer, water::  Minimal  Speech:  Slow  Volume:  Decreased  Mood:  Depressed but a little better  Affect:  Blunt  Thought Process:  Goal Directed  Orientation:  Full (Time, Place, and Person)  Thought Content:  Delusions and Paranoid Ideation  Suicidal Thoughts:  No  Homicidal Thoughts:  No  Memory:  Immediate;   Fair Recent;   Fair Remote;   Fair  Judgement:  Fair  Insight:  Fair  Psychomotor Activity:  Psychomotor Retardation  Concentration:  Fair  Recall:  [employer] of Knowledge:Fair  Language: Fair  Akathisia:  No  Handed:  Right  AIMS (if indicated):     Assets:  Communication Skills Desire for Improvement Financial Resources/Insurance Housing Resilience Social Support  ADL's:  Intact  Cognition: WNL  Sleep:  Number of Hours: 5.75   Treatment Plan Summary: Daily contact with patient to assess and evaluate symptoms and progress in treatment and Medication management   Mr. Chilson is a 65 year old male with a history of anxiety and depression admitted for worsening of depression, malaise and severe weight loss in the context of recent major loss.  1. Major Depression with possible Psychosis: The patient is a longer having any suicidal thoughts. He is however still severely depressed. Appetite is slowly improving. We'll plan to continue on Luvox 100 mg by mouth daily for depression. Due to orthostatic blood pressures, will decrease Zyprexa to 55m po nightly and consider switching Zyprexa  to Abilify given orthostatic hypotension and possibly anticholinergic effects. He is not dehydrated per bloodwork. He is on 15 min checks.    2. Insomnia. He responded well to trazodone.   3. BPH. We continued Flomax.  4. Failure to thrive. We offered Megace and Ensure. Medicine input is greatly appreciated. Abdomen  CT scan was unremarkable. The patient does have orthostatic hypotension. We'll push by mouth fluids and continue to monitor vital signs.  5. Cognitive decline. Psychological testing indicates unspecified neurocognitive disorder. Please see psychological consultation.  6. Metabolic syndrome monitoring. Lipid profile, TSH, and Hemoglobin A1c are normal. Prolactin 29.5.  7. R/O Vitamin B12 deficiency. Folic acid and vitamin B12 are normal.  8. Deconditioning. PT help is appreciated.   9. ECT. The patient now agrees to ECT treatment. Dr. CWeber Cooks  is aware of this patient's needs. Chest X-ray is unremarkable. EKG pending.  81. Fall. Head CT scan and left knee x-ray were negative.   11. Disposition. The patient will be discharged to home with family. He will follow-up with [facility]   Jay Schlichter, MD 01/17/2016, 11:31 AM

## 2016-01-17 NOTE — [facility] Group Notes (Signed)
[facility] Group Notes:  (Nursing/MHT/Case Management/Adjunct)  Date:  01/17/2016  Time:  5:49 PM  Type of Therapy:  Psychoeducational Skills  Participation Level:  Did Not Attend   Lynelle SmokeCara Travis [facility] CenterMadoni 01/17/2016, 5:49 PM

## 2016-01-18 NOTE — Progress Notes (Signed)
Patient states that he feels okay. Noted with a flat, depressed affect. He was out of room for meals and he actually attended one group(coloring and music). He said that the music made him relax. He nevertheless remained isolative to room and he refused further prompts to attend the rest of the groups. Provided patient with fresh ice water but he did not touch it despite much encouragement from writer to drink up. He drank his Ensures. No Falls event noted. Continues to utilize a wheelchair to get around. Denied SI,AH/VH and agreed to verbalize feelings to nursing staff.

## 2016-01-18 NOTE — Progress Notes (Signed)
[name]  01/18/2016 5:07 PM Dillon PriestRobert H [name]  MRN:  [id number]  Subjective:  [name] reports feeling better but it is hard to appreciate any improvement. He gets out of bed for meals only. He reports that her appetite but has not been drinking fluids adequately and is orthostatic. [name] lower Zyprexa from 10 to 5 mg yesterday. [name] is aware of this patient's needs and hopefully he will start ECT on Wednesday.  Principal Problem: Severe recurrent major depression without psychotic features ([facility]) Diagnosis:   Patient Active Problem List   Diagnosis Date Noted  . Suicidal ideation [R45.851] 01/04/2016  . Severe recurrent major depression without psychotic features ([facility]) [F33.2] 12/15/2015  . Grief [F43.21] 12/15/2015  . Weight loss [R63.4] 12/15/2015  . OCD (obsessive compulsive disorder) [F42.9] 12/15/2015  . Benign prostatic hyperplasia with urinary obstruction [N40.1] 11/24/2015  . 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 Meningitis     spinal  . OCD (obsessive compulsive disorder)   . Kidney stones     Past Surgical History  Procedure Laterality Date  . Kidney stone surgery     Family History:  Family History  Problem Relation Age of Onset  . Cancer Mother     bladder  . Emphysema Father   . Emphysema Sister   . COPD Sister   . Cancer Brother     prostate   Family Psychiatric  Historysee H&P.  Social History:  History  Alcohol Use No     History  Drug Use No    Social History   Social History  . Marital Status: Widowed    Spouse Name: Dillon [name]  . Number of Children: Dillon [name]  . Years of Education: Dillon [name]   Social History Main Topics  . Smoking status: Never Smoker   . Smokeless tobacco: Never Used  . Alcohol Use: No  . Drug Use: No  . Sexual Activity: Not Currently    Other Topics Concern  . None   Social History Narrative   Additional Social History:    History of alcohol / drug use?: No history of alcohol / drug abuse                    Sleep: Fair  Appetite:  Poor  Current Medications: Current Facility-Administered Medications  Medication Dose Route Frequency Provider Last Rate Last Dose  . acetaminophen (TYLENOL) tablet 650 mg  650 mg Oral Q6H PRN Dillon AmelJohn T Clapacs, MD      . alum & mag hydroxide-simeth (MAALOX/MYLANTA) 200-200-20 MG/5ML suspension 30 mL  30 mL Oral Q4H PRN Dillon AmelJohn T Clapacs, MD      . feeding supplement (ENSURE ENLIVE) (ENSURE ENLIVE) liquid 237 mL  237 mL Oral TID BM Dillon Passon B [name], MD   237 mL at 01/18/16 1505  . fluvoxaMINE (LUVOX) tablet 100 mg  100 mg Oral QHS Dillon ProwsJolanta B Sharan Mcenaney, MD   100 mg at 01/17/16 2214  . magnesium hydroxide (MILK OF MAGNESIA) suspension 30 mL  30 mL Oral Daily PRN Dillon AmelJohn T Clapacs, MD   30 mL at 01/07/16 1458  . megestrol (MEGACE) 400 MG/10ML suspension 400 mg  400 mg Oral BID Dillon ProwsJolanta B Cope Marte, MD   400 mg at 01/18/16 0918  . OLANZapine zydis (ZYPREXA) disintegrating tablet 5 mg  5 mg Oral QHS Dillon K  Maryruth Bun, MD   5 mg at 01/17/16 2214  . tamsulosin (FLOMAX) capsule 0.4 mg  0.4 mg Oral QPC supper Dillon Amel, MD   0.4 mg at 01/17/16 1616  . traZODone (DESYREL) tablet 50 mg  50 mg Oral QHS PRN Dillon Amel, MD        Lab Results: No results found for this or any previous visit (from the past 48 hour(s)).  Blood Alcohol level:  Lab Results  Component Value Date   ETH <5 01/04/2016   ETH <5 12/15/2015    Physical Findings: AIMS: Facial and Oral Movements Muscles of Facial Expression: None, normal Lips and Perioral Area: None, normal Jaw: None, normal Tongue: None, normal,Extremity Movements Upper (arms, wrists, hands, fingers): None, normal Lower (legs, knees, ankles, toes): None, normal, Trunk Movements Neck, shoulders, hips: None, normal, Overall Severity Severity  of abnormal movements (highest score from questions above): None, normal Incapacitation due to abnormal movements: None, normal Patient's awareness of abnormal movements (rate only patient's report): No Awareness, Dental Status Current problems with teeth and/or dentures?: Yes Does patient usually wear dentures?: No  CIWA:    COWS:     Musculoskeletal: Strength & Muscle Tone: within normal limits Gait & Station: normal Patient leans: Dillon [name]  Psychiatric Specialty Exam: Review of Systems  Constitutional: Positive for malaise/fatigue.  All other systems reviewed and are negative.   Blood pressure 109/64, pulse 63, temperature 97.8 F (36.6 C), temperature source Oral, resp. rate 20, height  (1.753 m), weight 67.586 kg (149 lb), SpO2 99 %.Body mass index is 21.99 kg/(m^2).  General Appearance: Fairly Groomed  Patent attorney::  Minimal  Speech:  Slow  Volume:  Decreased  Mood:  Depressed, Hopeless and Worthless  Affect:  Blunt  Thought Process:  Goal Directed  Orientation:  Full (Time, Place, and Person)  Thought Content:  Delusions and Paranoid Ideation  Suicidal Thoughts:  Yes.  with intent/plan  Homicidal Thoughts:  No  Memory:  Immediate;   Fair Recent;   Fair Remote;   Fair  Judgement:  Fair  Insight:  Fair  Psychomotor Activity:  Psychomotor Retardation  Concentration:  Fair  Recall:  [employer] of Knowledge:Fair  Language: Fair  Akathisia:  No  Handed:  Right  AIMS (if indicated):     Assets:  Communication Skills Desire for Improvement Financial Resources/Insurance Housing Physical Health Resilience Social Support  ADL's:  Intact  Cognition: WNL  Sleep:  Number of Hours: 6.5   Treatment Plan Summary: Daily contact with patient to assess and evaluate symptoms and progress in treatment and Medication management   Dillon [name] is a 65 year old male with a history of anxiety and depression admitted for worsening of depression, malaise and severe weight loss in  the context of recent major loss.  1. Suicidal ideation. The patient is passively suicidal unable to function. He is on 15 min checks.    2. Mood/anxietyand psychosis.Continue Luvox for depression and OCD and Zyprexa for psychosis. This was lowered to 5 mg yesterday due to hypertonia.  3. Insomnia. He responded well to trazodone.   4. BPH. We continued Flomax.  5. Failure to thrive. We offered Megace and Ensure. Medicine input is greatly appreciated. Abdomen CT scan was unremarkable.  6. Cognitive decline. Psychological testing indicates unspecified neurocognitive disorder. Please see psychological consultation.  7. Metabolic syndrome monitoring. Lipid profile, TSH, and Hemoglobin A1c are normal. Prolactin 29.5.  8. R/O Vitamin B12 deficiency. Folic acid and vitamin B12 are normal.  9. Deconditioning. PT help is appreciated.   10. ECT. The patient now agrees to ECT treatment. Dr. Toni Amend is aware of this patient's needs. Chest X-ray and EKG are unremarkable.   11. Fall. Head CT scan and left knee x-ray were negative.   12. Disposition. The patient will be discharged to home with family. He will follow-up with [facility].   Kristine Linea, MD 01/18/2016, 5:07 PM

## 2016-01-18 NOTE — Progress Notes (Signed)
Recreation Therapy Notes  Date: 04.03.17 Time: 9:55 am Location: Craft Room  Group Topic: Coping Skills  Goal Area(s) Addresses:  Patient will verbalize one positive emotion experienced in group. Patient will participate in healthy coping skill.  Behavioral Response: Attentive, Interactive  Intervention: Coloring  Activity: Patients were given coloring sheets and instructed to color. While they were coloring, LRT asked patients to think about the emotions they were experiencing and what they were thinking about.  Education: LRT educated patient on healthy coping skills.  Education Outcome: Acknowledges education/In group clarification offered  Clinical Observations/Feedback: Patient colored coloring sheet. Patient contributed to group discussion by stating what emotion he felt while he was coloring.   Jacquelynn CreeGreene,Claron Rosencrans M, LRT/CTRS 01/18/2016 4:33 PM

## 2016-01-18 NOTE — Progress Notes (Signed)
D: Patient appears sad. He brightens up some on approach. He stays to himself in room. He denies SI/HI/AVH. He also denies pain.  A: Medication given with education. Encouragement provided.   R: Patient has been compliant with medication. He has remained calm and cooperative. Safety maintained with 15 min checks.

## 2016-01-18 NOTE — Plan of Care (Signed)
Problem: Alteration in mood Goal: LTG-Patient reports reduction in suicidal thoughts (Patient reports reduction in suicidal thoughts and is able to verbalize a safety plan for whenever patient is feeling suicidal)  Outcome: Progressing Patient denies SI and contracts for safety.

## 2016-01-19 ENCOUNTER — Other Ambulatory Visit: Payer: Self-pay | Admitting: Psychiatry

## 2016-01-19 NOTE — [facility] Group Notes (Signed)
[facility] LCSW Group Therapy  01/19/2016 4:44 PM  Type of Therapy:  Group Therapy  Participation Level:  Minimal  Participation Quality:  Resistant  Affect:  Depressed and Flat  Cognitive:  Appropriate  Insight:  Improving  Engagement in Therapy:  Limited  Modes of Intervention:  Activity, Clarification, Problem-solving, Socialization and Support  Summary of Progress/Problems:Pt paticipated in group activity Wheel of Fortune activity and participated in group processing around his feelings about diagnosis.  Glennon MacLaws, Lashanda Storlie P, MSW, LCSW 01/19/2016, 4:44 PM

## 2016-01-19 NOTE — Progress Notes (Signed)
Recreation Therapy Notes  Date: 04.04.17 Time: 9:35 am Location: Craft Room  Group Topic: Self-expression  Goal Area(s) Addresses:  Patient will identify one color per emotion listed on wheel. Patient will verbalize one benefit of using art as a means of self-expression. Patient will verbalize one emotion experienced during session. Patient will be educated on other forms of self-expression.  Behavioral Response: Attentive, Interactive  Intervention: Emotion Wheel  Activity: Patients were given an emotion wheel and instructed to pick a color for each emotion listed on the wheel.  Education: LRT educated patients on different forms on self-expression.  Education Outcome: Acknowledges education/In group clarification offered  Clinical Observations/Feedback: Patient completed activity by picking a color for each emotion. Patient contributed to group discussion by stating what colors he picked for certain emotions.   Jacquelynn CreeGreene,Scotlynn Noyes M, LRT/CTRS 01/19/2016 12:14 PM

## 2016-01-19 NOTE — Progress Notes (Signed)
[facility] MD Progress Note  01/19/2016 2:46 PM Dillon [name]  MRN:  [id number]  Subjective:  Dillon [name] appears slightly better today. He attempted to participate in the morning groups. He is still fatigued, frustrated, hopeless, and very depressed. We talk about ECT again today. The patient will likely start ECT treatment tomorrow. He does not appear psychotic any longer. He accepts medications and tolerates them well. His food intake is slightly better but he still has difficulties drinking enough fluids, his blood pressure is low and his orthostatic. He uses a wheelchair to ambulate around the unit.   Principal Problem: Severe recurrent major depression without psychotic features (HCC) Diagnosis:   Patient Active Problem List   Diagnosis Date Noted  . Suicidal ideation [R45.851] 01/04/2016  . Severe recurrent major depression without psychotic features (HCC) [F33.2] 12/15/2015  . Grief [F43.21] 12/15/2015  . Weight loss [R63.4] 12/15/2015  . OCD (obsessive compulsive disorder) [F42.9] 12/15/2015  . Benign prostatic hyperplasia with urinary obstruction [N40.1] 11/24/2015  . 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  Past Medical History:  Past Medical History  Diagnosis Date  . Meningitis     spinal  . OCD (obsessive compulsive disorder)   . Kidney stones     Past Surgical History  Procedure Laterality Date  . Kidney stone surgery     Family History:  Family History  Problem Relation Age of Onset  . Cancer Mother     bladder  . Emphysema Father   . Emphysema Sister   . COPD Sister   . Cancer Brother     prostate   Family Psychiatric  History: None reported.  Social History:  History  Alcohol Use No     History  Drug Use No    Social History   Social History  . Marital Status: Widowed    Spouse Name: N/A  . Number of Children: N/A  . Years of Education: N/A    Social History Main Topics  . Smoking status: Never Smoker   . Smokeless tobacco: Never Used  . Alcohol Use: No  . Drug Use: No  . Sexual Activity: Not Currently   Other Topics Concern  . None   Social History Narrative   Additional Social History:    History of alcohol / drug use?: No history of alcohol / drug abuse                    Sleep: Fair  Appetite:  Poor  Current Medications: Current Facility-Administered Medications  Medication Dose Route Frequency Provider Last Rate Last Dose  . acetaminophen (TYLENOL) tablet 650 mg  650 mg Oral Q6H PRN [name], MD      . alum & mag hydroxide-simeth (MAALOX/MYLANTA) 200-200-20 MG/5ML suspension 30 mL  30 mL Oral Q4H PRN [name], MD      . feeding supplement (ENSURE ENLIVE) (ENSURE ENLIVE) liquid 237 mL  237 mL Oral TID BM Jolanta B Pucilowska, MD   237 mL at 01/19/16 1343  . fluvoxaMINE (LUVOX) tablet 100 mg  100 mg Oral QHS Shari Prows, MD   100 mg at 01/18/16 2149  . magnesium hydroxide (MILK OF MAGNESIA) suspension 30 mL  30 mL Oral Daily PRN [name], MD   30 mL at 01/07/16 1458  . megestrol (MEGACE) 400 MG/10ML suspension 400 mg  400 mg Oral BID Jolanta B Pucilowska,  MD   400 mg at 01/19/16 0826  . OLANZapine zydis (ZYPREXA) disintegrating tablet 5 mg  5 mg Oral QHS Darliss Ridgel, MD   5 mg at 01/18/16 2149  . tamsulosin (FLOMAX) capsule 0.4 mg  0.4 mg Oral QPC supper [name], MD   0.4 mg at 01/18/16 1743  . traZODone (DESYREL) tablet 50 mg  50 mg Oral QHS PRN [name], MD        Lab Results: No results found for this or any previous visit (from the past 48 hour(s)).  Blood Alcohol level:  Lab Results  Component Value Date   ETH <5 01/04/2016   ETH <5 12/15/2015    Physical Findings: AIMS: Facial and Oral Movements Muscles of Facial Expression: None, normal Lips and Perioral Area: None, normal Jaw: None, normal Tongue: None, normal,Extremity Movements Upper (arms,  wrists, hands, fingers): None, normal Lower (legs, knees, ankles, toes): None, normal, Trunk Movements Neck, shoulders, hips: None, normal, Overall Severity Severity of abnormal movements (highest score from questions above): None, normal Incapacitation due to abnormal movements: None, normal Patient's awareness of abnormal movements (rate only patient's report): No Awareness, Dental Status Current problems with teeth and/or dentures?: Yes Does patient usually wear dentures?: No  CIWA:    COWS:     Musculoskeletal: Strength & Muscle Tone: decreased Gait & Station: unsteady Patient leans: N/A  Psychiatric Specialty Exam: Review of Systems  Constitutional: Positive for malaise/fatigue.  Psychiatric/Behavioral: Positive for depression.  All other systems reviewed and are negative.   Blood pressure 109/64, pulse 63, temperature 97.7 F (36.5 C), temperature source Oral, resp. rate 20, height  (1.753 m), weight 67.586 kg (149 lb), SpO2 99 %.Body mass index is 21.99 kg/(m^2).  General Appearance: Fairly Groomed  Patent attorney::  Fair  Speech:  Slow  Volume:  Decreased  Mood:  Anxious, Depressed, Hopeless and Worthless  Affect:  Blunt  Thought Process:  Goal Directed  Orientation:  Full (Time, Place, and Person)  Thought Content:  WDL  Suicidal Thoughts:  Yes.  with intent/plan  Homicidal Thoughts:  No  Memory:  Immediate;   Fair Recent;   Fair Remote;   Fair  Judgement:  Poor  Insight:  Shallow  Psychomotor Activity:  Psychomotor Retardation  Concentration:  Fair  Recall:  Fair  Fund of Knowledge:Fair  Language: Fair  Akathisia:  No  Handed:  Right  AIMS (if indicated):     Assets:  Communication Skills Desire for Improvement Financial Resources/Insurance Housing Physical Health Resilience Social Support  ADL's:  Intact  Cognition: WNL  Sleep:  Number of Hours: 6.5   Treatment Plan Summary: Daily contact with patient to assess and evaluate symptoms and  progress in treatment and Medication management   Dillon [name] is a 65 year old male with a history of anxiety and depression admitted for worsening of depression, malaise and severe weight loss in the context of recent major loss.  1. Suicidal ideation. The patient is passively suicidal unable to function. He is on 15 min checks.    2. Mood/anxietyand psychosis.Continue Luvox for depression and OCD and Zyprexa for psychosis. This was lowered to 5 mg yesterday due to hypertonia.  3. Insomnia. He responded well to trazodone.   4. BPH. We continued Flomax.  5. Failure to thrive. We offered Megace and Ensure. Medicine input is greatly appreciated. Abdomen CT scan was unremarkable.  6. Cognitive decline. Psychological testing indicates unspecified neurocognitive disorder. Please see psychological consultation.  7. Metabolic syndrome  monitoring. Lipid profile, TSH, and Hemoglobin A1c are normal. Prolactin 29.5.  8. R/O Vitamin B12 deficiency. Folic acid and vitamin B12 are normal.  9. Deconditioning. PT help is appreciated.   10. ECT. The patient now agrees to ECT treatment. [name] is aware of this patient's needs. Chest X-ray and EKG are unremarkable.   11. Fall. Head CT scan and left knee x-ray were negative.   12. Disposition. The patient will be discharged to home with family. He will follow-up with [facility].    Kristine LineaJolanta Pucilowska, MD 01/19/2016, 2:46 PM

## 2016-01-19 NOTE — Plan of Care (Signed)
Problem: Ineffective individual coping Goal: LTG: Patient will report a decrease in negative feelings Outcome: Progressing Accepting  ECT process.

## 2016-01-19 NOTE — Progress Notes (Signed)
Nutrition Follow-up     INTERVENTION:   1) Encourage fluids and po intake, encourage Ensure  NUTRITION DIAGNOSIS:   Unintentional weight loss related to social / environmental circumstances, poor appetite as evidenced by percent weight loss.  GOAL:   Patient will meet greater than or equal to 90% of their needs  MONITOR:   PO intake, Supplement acceptance, Labs, Weight trends, I & O's  REASON FOR ASSESSMENT:   Malnutrition Screening Tool    ASSESSMENT:    Pt remains depressed, plan to begin ECT on Wednesday, po intake appears to be improving  Diet Order:  Diet regular Room service appropriate?: Yes; Fluid consistency:: Thin   Energy Intake: recent recorded po intake 80-100% of all meal trays, taking some Ensure as well   Recent Labs Lab 01/13/16 1421 01/16/16 1430  NA 139 139  K 4.2 4.1  CL 109 109  CO2 25 22  BUN 24* 30*  CREATININE 1.26* 1.24  CALCIUM 10.1 9.5  GLUCOSE 140* 186*    Meds: megace  Height:   Ht Readings from Last 1 Encounters:  01/04/16 5\' 9"  (1.753 m)    Weight: no new weight  Wt Readings from Last 1 Encounters:  01/05/16 149 lb (67.586 kg)   Filed Weights   01/04/16 1900 01/05/16 1500  Weight: 151 lb (68.493 kg) 149 lb (67.586 kg)    BMI:  Body mass index is 21.99 kg/(m^2).  Estimated Nutritional Needs:   Kcal:  1715-2055kcals  Protein:  55-68g protein (0.8-1.0g/kg)  Fluid:  1700-223000mL of fluid  EDUCATION NEEDS:   No education needs identified at this time  Romelle StarcherCate Arianna Delsanto MS, RD, LDN [phone] Pager  [phone] Weekend/On-Call Pager

## 2016-01-19 NOTE — Consult Note (Signed)
  Psychiatry: ECT consult. Follow-up for 65 year old man with severe recurrent depression. Spoke with Dr. Demetrius CharityP yesterday and today. Patient has improved somewhat but is still severely depressed. Still not eating or drinking enough. I interviewed the patient today and he says he feels like he is doing a little better. He denies that he's having any suicidal ideation. Denies he's having hallucinations. He shows reasonably good insight and understands that he needs to be eating or drinking more. He also is still weak and having difficulty walking but says that he is feeling overall stronger. Patient's eye contact was better affect was still blunted but improved speech was normal tone. No evidence of current psychotic thinking. Denies suicidal ideation.  Patient with recurrent severe depression including serious suicide attempts. Looking a little better than he was when he first came in. She had been recommended for ECT previously but had on my last encounter with him been unwilling to sign. On reevaluation today the patient appears to understand his illness and understands the nature and rationale for treatment and does have capacity to make decisions.  I described rationale for ECT and the procedure and risks and benefits. Patient asked appropriate questions. He ultimately stated that he would agree to a trial of ECT. We will plan to begin right unilateral treatment starting tomorrow. Orders will be placed. ECT team is aware. Did have an abnormal EKG although to me it did not seem like a problem that should contraindicate treatment. I'm going to order a repeat one tonight to see if it is still the same as it was a few days ago.  Diagnosis major depression severe recurrent with psychotic features now improving.

## 2016-01-19 NOTE — Progress Notes (Signed)
Physical Therapy Treatment Patient Details Name: Dillon [name] MRN: [id number] DOB: June 06, 1951 Today's Date: 01/19/2016    History of Present Illness The patient has a long history of anxiety with OCD type of symptoms that went mostly untreated. He became severely depressed and anxious after his wife passed away in 03/05/15. He lost 35 pounds initially. He was hospitalized at [facility] in March and discharged 10 days ago. He has not been taking any medications at home, he has not been eating or drinking fluids. He did not follow-up with his appointments, grief counseling or senior center. He lost 16 pounds in the past 10 days. He lost interest in food and no longer feels hungry. He also has abdominal discomfort. He does not eat because he is afraid it will making sick. He reports poor sleep, decreased appetite, anhedonia, hopelessness worthlessness, poor energy and lack of interest, social isolation, crying spells, worsening of anxiety, and suicidal ideation. The patient has been trying to deal with it the best he could but has been struggling recently. He denies psychotic symptoms. He denies symptoms suggestive of bipolar disorder. He does not use drugs or alcohol. Pt was admitted to behavioral medicine unit at [facility] for depressive episode with suicidal ideation. Pt is pleasant upon arrival with physical therapist although feels generally weak and low on energy. He reports full independence with ADLs/IADLs prior to arrival. He typically does not ambulate with an assistive device and denies any falls over the last 12 months.     PT Comments    Pt has met PT goals. Pt able to perform bed mobility independently. Pt able to perform sit to stand transfer from EOB independently w/CGA for safety. Pt able to perform supine and seated there-ex independently. Pt provided exercise handout and expressed understanding. Pt motivated to participate in PT and stated will perform there-ex on own.  Session limited today by pts orthostatic hypotension (81/45) with standing. Pt expressed no feelings of dizziness during session. Pt demonstrates significant improvements in strength and mobility. No PT follow up needed after discharge from acute hospitalization. Will DC current orders.  Follow Up Recommendations  No PT follow up     Equipment Recommendations       Recommendations for Other Services       Precautions / Restrictions Precautions Precautions: Fall Restrictions Weight Bearing Restrictions: No    Mobility  Bed Mobility Overal bed mobility: Independent             General bed mobility comments: Pt demonstrates good speed and sequencing when performing bed mobility.  Transfers Overall transfer level: Independent Equipment used: None Transfers: Sit to/from Stand Sit to Stand: Min guard         General transfer comment: Pt able to perform sit to stand from EOB independently w/ min guard for safety. Transfer ability significantly improved compared to previous visits.  Ambulation/Gait Ambulation/Gait assistance: Min guard;Independent Ambulation Distance (Feet): 3 Feet Assistive device: None Gait Pattern/deviations: Step-through pattern     General Gait Details: Pt able to ambulate from EOB to wheelchair w/ CGA for safety. Ambulation limited due to pts orthostatic hypotension.    Stairs            Wheelchair Mobility    Modified Rankin (Stroke Patients Only)       Balance  Cognition Arousal/Alertness: Awake/alert Behavior During Therapy: WFL for tasks assessed/performed Overall Cognitive Status: Within Functional Limits for tasks assessed                      Exercises Other Exercises Other Exercises: Pt performed supine ther-ex on B LE including SLR, SAQ, hip abd, heel slides, and hip add w/pillow with no assist. Pt performed seated ther-ex in WC including marching and LAQ. Pt  provided cues on proper form. All ther-ex performed x10 reps, except LAQ performed x15. Pt expressed understanding of HEP handout provided. Pt expressed exercises felt good to do.      General Comments        Pertinent Vitals/Pain Pain Assessment: No/denies pain    Home Living                      Prior Function            PT Goals (current goals can now be found in the care plan section) Acute Rehab PT Goals Patient Stated Goal: None provided by patient Progress towards PT goals: Goals met/education completed, patient discharged from PT    Frequency  Min 2X/week    PT Plan Current plan remains appropriate    Co-evaluation             End of Session Equipment Utilized During Treatment: Gait belt Activity Tolerance: Patient tolerated treatment well Patient left: in bed     Time: 9728-2060 PT Time Calculation (min) (ACUTE ONLY): 18 min  Charges:                       G Codes:      Sherral Hammers 2016-01-24, 4:49 PM  M. Barnett Abu, SPT

## 2016-01-19 NOTE — Progress Notes (Signed)
D: Patient denies SI/HI/AVH.  Patient affect and mood are depressed.  Patient did not attend evening group. Patient remained in his room throughout the shift.  Patient reports improvement with his appetite. No distress noted. A: Support and encouragement offered. Scheduled medications given to pt. Q 15 min checks continued for patient safety. R: Patient receptive. Patient remains safe on the unit.

## 2016-01-19 NOTE — [facility] Group Notes (Signed)
[facility]  01/18/2016 4:45 PM  Type of Therapy:  Group Therapy  Participation Level:  Did Not Attend  Glennon MacLaws, Zigmond Trela P MSW, LCSW 01/19/2016, 4:45 PM

## 2016-01-19 NOTE — Progress Notes (Signed)
D: Patient seen by [name]  And is aware of   ECT on Wednesday. Patient participatory with   Unit programing  this am shift . Appetite good . No ADL's   Limited interaction with peers. Seen by physical therapy this shift .Affect cheerful  .on approach .D: Patient stated slept fair last night . and energy level fair . Stated concentration improving . Stated on Depression scale 2 , hopeless  2 and anxiety 0 .( low 0-10 high) Denies suicidal  homicidal ideations  .  No auditory hallucinations  No pain concerns . A: Encourage patient participation with unit programming . Instruction  Given on  Medication , verbalize understanding. R: Voice no other concerns. Staff continue to monitor

## 2016-01-20 ENCOUNTER — Inpatient Hospital Stay: Payer: BLUE CROSS/BLUE SHIELD | Admitting: Certified Registered"

## 2016-01-20 ENCOUNTER — Encounter: Payer: Self-pay | Admitting: Anesthesiology

## 2016-01-20 ENCOUNTER — Inpatient Hospital Stay: Payer: BLUE CROSS/BLUE SHIELD

## 2016-01-20 MED ORDER — SUCCINYLCHOLINE CHLORIDE 20 MG/ML IJ SOLN
INTRAMUSCULAR | Status: DC | PRN
  Administered 2016-01-20: 80 mg via INTRAVENOUS

## 2016-01-20 MED ORDER — METHOHEXITAL SODIUM 100 MG/10ML IV SOSY
PREFILLED_SYRINGE | INTRAVENOUS | Status: DC | PRN
  Administered 2016-01-20: 70 mg via INTRAVENOUS

## 2016-01-20 MED ORDER — SODIUM CHLORIDE 0.9 % IV SOLN
INTRAVENOUS | Status: DC | PRN
  Administered 2016-01-20: 13:00:00 via INTRAVENOUS

## 2016-01-20 MED ORDER — SODIUM CHLORIDE 0.9 % IV SOLN
250.0000 mL | Freq: Once | INTRAVENOUS | Status: AC
  Administered 2016-01-20: 500 mL via INTRAVENOUS

## 2016-01-20 MED ORDER — LABETALOL HCL 5 MG/ML IV SOLN
INTRAVENOUS | Status: DC | PRN
  Administered 2016-01-20: 20 mg via INTRAVENOUS

## 2016-01-20 NOTE — Tx Team (Signed)
Interdisciplinary Treatment Plan Update (Adult)  Date:  01/20/2016 Time Reviewed:  3:51 PM  Progress in Treatment: Attending groups: No. Participating in groups:  No. Taking medication as prescribed:  Yes. Tolerating medication:  Yes. Family/Significant othe contact made:  Yes, individual(s) contacted:  daughter and son Patient understands diagnosis:  No. and As evidenced by:  limited insight Discussing patient identified problems/goals with staff:  Yes. Medical problems stabilized or resolved:  Yes. Denies suicidal/homicidal ideation: Yes. Issues/concerns per patient self-inventory:  Yes. Other:  New problem(s) identified: No, Describe:  NA  Discharge Plan or Barriers: Pt plans to return home and follow up with outpatient.    Reason for Continuation of Hospitalization: Depression Medication stabilization Suicidal ideation  Comments:Patient awaiting ECT consult from Dr. Weber Cooks due to continued symptoms.   Estimated length of stay: 7 days   New goal(s): NA  Review of initial/current patient goals per problem list:   1.  Goal(s): Patient will participate in aftercare plan * Met: No * Target date: at discharge * As evidenced by: Patient will participate within aftercare plan AEB aftercare provider and housing plan at discharge being identified. 01/19/16: has a place to go but needs follow up arranged by discharge   2.  Goal (s): Patient will exhibit decreased depressive symptoms and suicidal ideations. * Met: No *  Target date: at discharge * As evidenced by: Patient will utilize self rating of depression at 3 or below and demonstrate decreased signs of depression or be deemed stable for discharge by MD. 01/19/16: Patient remains SI and awaiting ECT consult   3.  Goal(s): Patient will demonstrate decreased signs and symptoms of anxiety. * Met: No * Target date: at discharge * As evidenced by: Patient will utilize self rating of anxiety at 3 or below and demonstrated decreased  signs of anxiety, or be deemed stable for discharge by MD 01/19/16: Patient will continue to be monitored for anxiety sypmptoms.   4.  Goal (s): Patient will demonstrate decreased symptoms of psychosis. * Met: No  *  Target date: at discharge * As evidenced by: Patient will not endorse signs of psychosis or be deemed stable for discharge by MD.  01/19/16: Patient continues to take meds and monitored for med adjustments.   Attendees: Physician:   Orson Slick, MD 4/5/20173:51 PM  Nursing:   Polly Cobia, RN  4/5/20173:51 PM  Other: Everitt Amber, [place]  4/5/20173:51 PM  Other:  Fransisca Connors 4/5/20173:51 PM  Other:  Garald Braver, Psych D. 4/5/20173:51 PM  Other:  4/5/20173:51 PM  Other:  4/5/20173:51 PM  Other:  4/5/20173:51 PM  Other:  4/5/20173:51 PM  Other:  4/5/20173:51 PM  Other:  4/5/20173:51 PM  Other:   4/5/20173:51 PM   Scribe for Treatment Team:   Keene Breath, MSW, LCSW  01/20/2016, 3:51 PM

## 2016-01-20 NOTE — Procedures (Signed)
ECT SERVICES Physician's Interval Evaluation & Treatment Note  Patient Identification: Dillon PriestRobert H [name] MRN:  [id number] Date of Evaluation:  01/20/2016 TX #: 1  MADRS: 29  MMSE: 30  P.E. Findings:  Lungs and heart clear. Gaining a little bit of weight. Still weak with some difficulty ambulating  Psychiatric Interval Note:  Depressed denies active suicidal ideation no current psychosis  Subjective:  Patient is a 65 y.o. male seen for evaluation for Electroconvulsive Therapy. No specific new complaint  Treatment Summary:   [x]   Right Unilateral             []  Bilateral   % Energy : 0.3 ms 60%   Impedance: 1170 ohms  Seizure Energy Index: 8575 V squared  Postictal Suppression Index: 94%  Seizure Concordance Index: 96%  Medications  Pre Shock: Brevital 70 mg succinylcholine 80 mg  Post Shock: None  Seizure Duration: 19 seconds by EMG, 42 seconds by EEG   Comments: Follow-up Friday   Lungs:  [x]   Clear to auscultation               []  Other:   Heart:    [x]   Regular rhythm             []  irregular rhythm    [x]   Previous H&P reviewed, patient examined and there are NO CHANGES                 []   Previous H&P reviewed, patient examined and there are changes noted.   Mordecai RasmussenJohn Jamarquis Crull, MD 4/5/20171:02 PM

## 2016-01-20 NOTE — Progress Notes (Signed)
D: Observed pt on day room in wheelchair. Patient alert and oriented x4. Patient denies SI/HI/AVH. Pt affect is flat. Pt has been much more active this shift and out of room. Pt went to group, came to the med room, and went to snack. Pt usually will stay in room all night.Pt stated "I'm making an effort." Pt rated depression 4/10 and anxiety 3/10. Pt stated "I'm feeling more calm, can focus more, and and trying to have a better attitude." Pt had no other complaints A: Offered active listening and support. Provided therapeutic communication. Administered scheduled medications. Encouraged pt to continue being more active on the unit and to keep attending more groups. R: Pt pleasant and cooperative. Pt medication compliant. Will continue Q15 min. checks. Safety maintained.

## 2016-01-20 NOTE — Plan of Care (Signed)
Problem: Ineffective individual coping Goal: STG-Increase in ability to manage activities of daily living Outcome: Progressing Pt more active on the unit. Managing daily activities better.

## 2016-01-20 NOTE — Anesthesia Preprocedure Evaluation (Signed)
Anesthesia Evaluation  Patient identified by MRN, date of birth, ID band Patient awake    Reviewed: Allergy & Precautions, H&P , NPO status , Patient's Chart, lab work & pertinent test results, reviewed documented beta blocker date and time   History of Anesthesia Complications (+) PROLONGED EMERGENCE and history of anesthetic complications  Airway Mallampati: III  TM Distance: >3 FB Neck ROM: full  Mouth opening: Limited Mouth Opening  Dental no notable dental hx. (+) Poor Dentition, Missing, Chipped   Pulmonary neg pulmonary ROS,    Pulmonary exam normal breath sounds clear to auscultation       Cardiovascular Exercise Tolerance: Good negative cardio ROS Normal cardiovascular exam Rhythm:regular Rate:Normal     Neuro/Psych PSYCHIATRIC DISORDERS (Depression and OCD) negative neurological ROS     GI/Hepatic Neg liver ROS, GERD  ,  Endo/Other  negative endocrine ROS  Renal/GU Renal disease (kidney stones)  negative genitourinary   Musculoskeletal   Abdominal   Peds  Hematology negative hematology ROS (+)   Anesthesia Other Findings Past Medical History:   Meningitis                                                     Comment:spinal   OCD (obsessive compulsive disorder)                          Kidney stones                                                Reproductive/Obstetrics negative OB ROS                             Anesthesia Physical  Anesthesia Plan  ASA: III  Anesthesia Plan: General   Post-op Pain Management:    Induction:   Airway Management Planned:   Additional Equipment:   Intra-op Plan:   Post-operative Plan:   Informed Consent: I have reviewed the patients History and Physical, chart, labs and discussed the procedure including the risks, benefits and alternatives for the proposed anesthesia with the patient or authorized representative who has indicated  his/her understanding and acceptance.   Dental Advisory Given  Plan Discussed with: Anesthesiologist, CRNA and Surgeon  Anesthesia Plan Comments:         Anesthesia Quick Evaluation  

## 2016-01-20 NOTE — Progress Notes (Signed)
Patient returned from treatment  Procedure . Appetite good at lunch. Return to room following  Meal, Interacting with peers and staff D: Patient stated slept good last night .Stated appetite is good and energy level  Is normal. Stated concentration is fair.  Denies suicidal  homicidal ideations  .  No auditory hallucinations  No pain concerns .NO Appropriate ADL'S.  A: Encourage patient participation with unit programming . Instruction  Given on  Medication , verbalize understanding. R: Voice no other concerns. Staff continue to monitor Stated on Depression scale , hopeless and anxiety .( low

## 2016-01-20 NOTE — [facility] Group Notes (Signed)
[facility] LCSW Aftercare Discharge Planning Group Note   01/20/2016 11:18 AM  Participation Quality: Did not attend.   Dracen Reigle L Rossi Silvestro MSW, LCSWA    

## 2016-01-20 NOTE — Plan of Care (Signed)
Problem: Ineffective individual coping Goal: STG: Pt will be able to identify effective and ineffective STG: Pt will be able to identify effective and ineffective coping patterns  Outcome: Progressing              Attending treatment procedure

## 2016-01-20 NOTE — H&P (Signed)
Dillon PriestRobert H [name] is an 65 y.o. male.   Chief Complaint: Patient with major depression recurrent severe currently in the hospital because of her return of depressive episode. ECT for treatment of depression HPI: See note above. He is improving and no longer actively suicidal and is starting to eat better  Past Medical History  Diagnosis Date  . Meningitis     spinal  . OCD (obsessive compulsive disorder)   . Kidney stones     Past Surgical History  Procedure Laterality Date  . Kidney stone surgery      Family History  Problem Relation Age of Onset  . Cancer Mother     bladder  . Emphysema Father   . Emphysema Sister   . COPD Sister   . Cancer Brother     prostate   Social History:  reports that he has never smoked. He has never used smokeless tobacco. He reports that he does not drink alcohol or use illicit drugs.  Allergies:  Allergies  Allergen Reactions  . Codeine Other (See Comments)    Unsure of reaction    Medications Prior to Admission  Medication Sig Dispense Refill  . tamsulosin (FLOMAX) 0.4 MG CAPS capsule Take 1 capsule (0.4 mg total) by mouth 2 (two) times daily. 30 capsule 0    No results found for this or any previous visit (from the past 48 hour(s)). No results found.  Review of Systems  Constitutional: Negative.   HENT: Negative.   Eyes: Negative.   Respiratory: Negative.   Cardiovascular: Negative.   Gastrointestinal: Negative.   Musculoskeletal: Negative.   Skin: Negative.   Neurological: Negative.   Psychiatric/Behavioral: Positive for depression and memory loss. Negative for suicidal ideas, hallucinations and substance abuse. The patient has insomnia. The patient is not nervous/anxious.     Blood pressure 144/77, pulse 51, temperature 97.7 F (36.5 C), temperature source Oral, resp. rate 18, height 5\' 9"  (1.753 m), weight 72.576 kg (160 lb), SpO2 98 %. Physical Exam  Nursing note and vitals reviewed. Constitutional: He appears  well-developed and well-nourished.  HENT:  Head: Normocephalic and atraumatic.  Eyes: Conjunctivae are normal. Pupils are equal, round, and reactive to light.  Neck: Normal range of motion.  Cardiovascular: Normal rate, regular rhythm and normal heart sounds.   Respiratory: Effort normal and breath sounds normal. No respiratory distress.  GI: Soft.  Musculoskeletal: Normal range of motion.  Neurological: He is alert.  Skin: Skin is warm and dry.  Psychiatric: Judgment and thought content normal. His affect is blunt. His speech is delayed. He is slowed. Cognition and memory are normal.     Assessment/Plan Follow-up scheduled for Friday and continue 3 times a week scheduling  Dillon RasmussenJohn Lucius Wise, MD 01/20/2016, 1:00 PM

## 2016-01-20 NOTE — Transfer of Care (Signed)
Immediate Anesthesia Transfer of Care Note  Patient: Dillon [name]  Procedure(s) Performed: * No procedures listed *  Patient Location: PACU  Anesthesia Type:General  Level of Consciousness: awake, alert , oriented and patient cooperative  Airway & Oxygen Therapy: Patient Spontanous Breathing and Patient connected to face mask oxygen  Post-op Assessment: Report given to RN, Post -op Vital signs reviewed and stable and Patient moving all extremities X 4  Post vital signs: Reviewed and stable  Last Vitals:  Filed Vitals:   01/19/16 0700 01/20/16 0822  BP:  144/77  Pulse:  51  Temp: 36.5 C   Resp:  18    Complications: No apparent anesthesia complications

## 2016-01-20 NOTE — Progress Notes (Signed)
[facility]  01/20/2016 5:55 PM Richardo PriestRobert H Smyser  MRN:  [id number]  Subjective: Overall he does appear improved from when he first came into the hospital and is making more of an effort to participate. He is eating better. Still however has a flat dysphoric affect gives up easily and is frequently very frustrated. Patient had agreed to ECT and had his first right unilateral ECT treatment today which went off without complication. Principal Problem: Severe recurrent major depression without psychotic features (HCC) Diagnosis:   Patient Active Problem List   Diagnosis Date Noted  . Suicidal ideation [R45.851] 01/04/2016  . Severe recurrent major depression without psychotic features (HCC) [F33.2] 12/15/2015  . Grief [F43.21] 12/15/2015  . Weight loss [R63.4] 12/15/2015  . OCD (obsessive compulsive disorder) [F42.9] 12/15/2015  . Benign prostatic hyperplasia with urinary obstruction [N40.1] 11/24/2015  . 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  Past Medical History:  Past Medical History  Diagnosis Date  . Meningitis     spinal  . OCD (obsessive compulsive disorder)   . Kidney stones     Past Surgical History  Procedure Laterality Date  . Kidney stone surgery     Family History:  Family History  Problem Relation Age of Onset  . Cancer Mother     bladder  . Emphysema Father   . Emphysema Sister   . COPD Sister   . Cancer Brother     prostate   Family Psychiatric  History: None reported.  Social History:  History  Alcohol Use No     History  Drug Use No    Social History   Social History  . Marital Status: Widowed    Spouse Name: N/A  . Number of Children: N/A  . Years of Education: N/A   Social History Main Topics  . Smoking status: Never Smoker   . Smokeless tobacco: Never Used  . Alcohol Use: No  . Drug Use: No  . Sexual Activity: Not  Currently   Other Topics Concern  . None   Social History Narrative   Additional Social History:    History of alcohol / drug use?: No history of alcohol / drug abuse                    Sleep: Fair  Appetite:  Poor  Current Medications: Current Facility-Administered Medications  Medication Dose Route Frequency Provider Last Rate Last Dose  . acetaminophen (TYLENOL) tablet 650 mg  650 mg Oral Q6H PRN [name], MD      . alum & mag hydroxide-simeth (MAALOX/MYLANTA) 200-200-20 MG/5ML suspension 30 mL  30 mL Oral Q4H PRN [name], MD      . feeding supplement (ENSURE ENLIVE) (ENSURE ENLIVE) liquid 237 mL  237 mL Oral TID BM Jolanta B Pucilowska, MD   237 mL at 01/19/16 1343  . fluvoxaMINE (LUVOX) tablet 100 mg  100 mg Oral QHS Jolanta B Pucilowska, MD   100 mg at 01/19/16 2240  . magnesium hydroxide (MILK OF MAGNESIA) suspension 30 mL  30 mL Oral Daily PRN [name], MD   30 mL at 01/07/16 1458  . megestrol (MEGACE) 400 MG/10ML suspension 400 mg  400 mg Oral BID Shari ProwsJolanta B Pucilowska, MD   400 mg at 01/20/16 1735  . OLANZapine zydis (ZYPREXA) disintegrating tablet 5 mg  5 mg Oral QHS Darliss RidgelAarti K Kapur,  MD   5 mg at 01/19/16 2240  . tamsulosin (FLOMAX) capsule 0.4 mg  0.4 mg Oral QPC supper [name], MD   0.4 mg at 01/20/16 1735  . traZODone (DESYREL) tablet 50 mg  50 mg Oral QHS PRN [name], MD        Lab Results: No results found for this or any previous visit (from the past 48 hour(s)).  Blood Alcohol level:  Lab Results  Component Value Date   ETH <5 01/04/2016   ETH <5 12/15/2015    Physical Findings: AIMS: Facial and Oral Movements Muscles of Facial Expression: None, normal Lips and Perioral Area: None, normal Jaw: None, normal Tongue: None, normal,Extremity Movements Upper (arms, wrists, hands, fingers): None, normal Lower (legs, knees, ankles, toes): None, normal, Trunk Movements Neck, shoulders, hips: None, normal, Overall  Severity Severity of abnormal movements (highest score from questions above): None, normal Incapacitation due to abnormal movements: None, normal Patient's awareness of abnormal movements (rate only patient's report): No Awareness, Dental Status Current problems with teeth and/or dentures?: Yes Does patient usually wear dentures?: No  CIWA:    COWS:     Musculoskeletal: Strength & Muscle Tone: decreased Gait & Station: unsteady Patient leans: N/A  Psychiatric Specialty Exam: Review of Systems  Constitutional: Positive for malaise/fatigue.  Psychiatric/Behavioral: Positive for depression.  All other systems reviewed and are negative.   Blood pressure 105/62, pulse 68, temperature 98 F (36.7 C), temperature source Oral, resp. rate 18, height  (1.753 m), weight 72.576 kg (160 lb), SpO2 99 %.Body mass index is 23.62 kg/(m^2).  General Appearance: Fairly Groomed  Patent attorney::  Fair  Speech:  Slow  Volume:  Decreased  Mood:  Anxious, Depressed, Hopeless and Worthless  Affect:  Blunt  Thought Process:  Goal Directed  Orientation:  Full (Time, Place, and Person)  Thought Content:  WDL  Suicidal Thoughts:  Yes.  with intent/plan  Homicidal Thoughts:  No  Memory:  Immediate;   Fair Recent;   Fair Remote;   Fair  Judgement:  Poor  Insight:  Shallow  Psychomotor Activity:  Psychomotor Retardation  Concentration:  Fair  Recall:  [employer] of Knowledge:Fair  Language: Fair  Akathisia:  No  Handed:  Right  AIMS (if indicated):     Assets:  Communication Skills Desire for Improvement Financial Resources/Insurance Housing Physical Health Resilience Social Support  ADL's:  Intact  Cognition: WNL  Sleep:  Number of Hours: 7   Treatment Plan Summary: Daily contact with patient to assess and evaluate symptoms and progress in treatment and Medication management   Mr. Carrick is a 65 year old male with a history of anxiety and depression admitted for worsening of  depression, malaise and severe weight loss in the context of recent major loss.  1. Suicidal ideation. The patient is passively suicidal unable to function. He is on 15 min checks.    2. Mood/anxietyand psychosis.Continue Luvox for depression and OCD and Zyprexa for psychosis. This was lowered to 5 mg yesterday due to hypertonia.  3. Insomnia. He responded well to trazodone.   4. BPH. We continued Flomax.  5. Failure to thrive. We offered Megace and Ensure. Medicine input is greatly appreciated. Abdomen CT scan was unremarkable.  6. Cognitive decline. Psychological testing indicates unspecified neurocognitive disorder. Please see psychological consultation.  7. Metabolic syndrome monitoring. Lipid profile, TSH, and Hemoglobin A1c are normal. Prolactin 29.5.  8. R/O Vitamin B12 deficiency. Folic acid and vitamin B12 are normal.  9. Deconditioning. PT help is appreciated.   10. ECT.continue 3 times a week ECT next treatment scheduled for Wednesday. Reviewed labs and EKG. No contraindication to continuing. We'll monitor daily. No change to his medicine for now for the depression which seems to be potentially helping. Patient's blood pressures staying on the low side but he is mostly staying in a wheelchair. e.   11. Fall. Head CT scan and left knee x-ray were negative.   12. Disposition. The patient will be discharged to home with family. He will follow-up with [facility].    [name], MD 01/20/2016, 5:55 PM

## 2016-01-20 NOTE — Progress Notes (Signed)
Pt port accessed with 20 G needle per Debbie S.

## 2016-01-20 NOTE — Progress Notes (Signed)
D: Patient aware of ECT  Treatment this am shift . Patient is NPO , Affect cheerful on approach  Noted more intergenic with peer and staff  . A: Patient left for treatment  At 0800 R: Receptive to  intervention

## 2016-01-20 NOTE — [facility] Group Notes (Signed)
[facility] Group Notes:  (Nursing/MHT/Case Management/Adjunct)  Date:  01/20/2016  Time:  3:59 PM  Type of Therapy:  Psychoeducational Skills  Participation Level:  Did Not Attend    Dillon Farberamela M Moses [name] 01/20/2016, 3:59 PM

## 2016-01-20 NOTE — [facility] Group Notes (Signed)
[facility] LCSW Group Therapy  01/20/2016 2:46 PM  Type of Therapy:  Group Therapy  Participation Level:  Did Not Attend  Modes of Intervention:  Activity, Discussion, Education, Socialization and Support  Summary of Progress/Problems: Emotional Regulation: Patients will identify both negative and positive emotions. They will discuss emotions they have difficulty regulating and how they impact their lives. Patients will be asked to identify healthy coping skills to combat unhealthy reactions to negative emotions.     Dillon [name] Dillon [name] MSW, LCSWA  01/20/2016, 2:46 PM   

## 2016-01-20 NOTE — Progress Notes (Signed)
Recreation Therapy Notes  Date: 04.05.17 Time: 9:30 am Location: Craft Room  Group Topic: Self-esteem  Goal Area(s) Addresses:  Patient will write at least one positive trait about self. Patient will verbalize benefit of having healthy self-esteem.  Behavioral Response: Did not attend  Intervention: I Am  Activity: Patients were given a worksheet with the letter I on it and instructed to write as many positive traits inside the letter as they could.  Education: LRT educated patients on ways they can increase their self-esteem.  Education Outcome: Patient did not attend group.  Clinical Observations/Feedback: Patient did not attend group.  Jacquelynn CreeGreene,Alyvia Derk M, LRT/CTRS 01/20/2016 10:04 AM

## 2016-01-20 NOTE — [facility] Group Notes (Signed)
[facility] Group Notes:  (Nursing/MHT/Case Management/Adjunct)  Date:  01/20/2016  Time:  6:22 AM  Type of Therapy:  Group Therapy  Participation Level:  Did Not Attend    Dillon [name] 01/20/2016, 6:22 AM

## 2016-01-21 ENCOUNTER — Other Ambulatory Visit: Payer: Self-pay | Admitting: Psychiatry

## 2016-01-21 NOTE — [facility] Group Notes (Signed)
[facility] Group Notes:  (Nursing/MHT/Case Management/Adjunct)  Date:  01/21/2016  Time:  9:08 PM  Type of Therapy:  Evening Wrap-up Group  Participation Level:  Did Not Attend  Participation Quality:  N/A  Affect:  N/A  Cognitive:  N/A  Insight:  None  Engagement in Group:  N/A  Modes of Intervention:  Discussion  Summary of Progress/Problems:  Tomasita MorrowChelsea Nanta Keidy Thurgood 01/21/2016, 9:08 PM

## 2016-01-21 NOTE — Progress Notes (Signed)
Recreation Therapy Notes  Date: 04.06.17 Time: 9:30 am Location: Craft Room  Group Topic: Leisure Education  Goal Area(s) Addresses:  Patient will identify activities for each letter of the alphabet.  Patient will verbalize one emotion felt while participating in leisure activities.  Behavioral Response: Attentive, Interactive  Intervention: Leisure Alphabet  Activity: Patients were given a leisure Information systems manageralphabet worksheet. LRT and patients went through worksheet together naming healthy leisure activities for each letter of the alphabet.  Education: LRT educated patients on using leisure as a Associate Professorcoping skill.  Education Outcome: Acknowledges education/In group clarification offered  Clinical Observations/Feedback: Patient completed activity by writing down leisure activities. Patient contributed to group by stating some leisure activities he wrote. Patient contributed to group discussion by stating what kind of emotions he felt when he participates in leisure activities and what he is thinking about when he is participating in leisure activities.  Jacquelynn CreeGreene,Munachimso Rigdon M, LRT/CTRS 01/21/2016 10:12 AM

## 2016-01-21 NOTE — [facility] Group Notes (Signed)
[facility] Group Notes:  (Nursing/MHT/Case Management/Adjunct)  Date:  01/21/2016  Time:  3:50 PM  Type of Therapy:  Movement Therapy  Participation Level:  Minimal  Participation Quality:  Attentive  Affect:  Appropriate  Cognitive:  Appropriate  Insight:  Appropriate  Engagement in Group:  Limited  Modes of Intervention:  Activity and Discussion  Summary of Progress/Problems:  Dillon [name] Dillon [name] Dillon [name] 01/21/2016, 3:50 PM

## 2016-01-21 NOTE — [facility] Group Notes (Signed)
[facility] LCSW Group Therapy  01/21/2016 3:47 PM  Type of Therapy:  Group Therapy  Participation Level:  Minimal  Participation Quality:  Attentive   Affect:  Depressed   Cognitive:  Alert   Insight:  Improving   Engagement in Therapy:  Improving   Modes of Intervention:  Discussion, Education, Socialization and Support  Summary of Progress/Problems: Balance in life: Patients will discuss the concept of balance and how it looks and feels to be unbalanced. Pt will identify areas in their life that is unbalanced and ways to become more balanced. Pt attended grop and stayed the entire time. Pt sat quietly and listened to other group members share.     Daisy Floroandace L Donavin Audino MSW, LCSWA  01/21/2016, 3:47 PM

## 2016-01-21 NOTE — Progress Notes (Signed)
D: Patient stated sleptfairlast night .Stated appetite is fair and energy level improving  . Stated concentration is good . Stated on Depression scale  2, hopeless 2 and anxiety 2 .( low 0-10 high) Denies suicidal  homicidal ideations  .  No auditory hallucinations  No pain concerns . Appropriate ADL'S. Interacting with peers and staff. Voice of being positive with his attitude .  And applying himself .  Aware of  ECT tomorrow .   A: Encourage patient participation with unit programming . Instruction  Given on  Medication , verbalize understanding. R: Voice no other concerns. Staff continue to monitor

## 2016-01-21 NOTE — Progress Notes (Signed)
D: Pt denies SI/HI/AVH. Pt is pleasant and cooperative. Patient's affect is flat and sad, he appears less anxious, but,  is not interacting with peers/  A: Pt was offered support and encouragement. Pt was given scheduled medications. Pt was encouraged to attend groups. Q 15 minute checks were done for safety.  R:Pt attends groups and interacts well with peers and staff. Pt is taking medication. Pt has no complaints.Pt receptive to treatment and safety maintained on unit.

## 2016-01-21 NOTE — Tx Team (Signed)
Interdisciplinary Treatment Plan Update (Adult)  Date:  01/21/2016 Time Reviewed:  4:02 PM  Progress in Treatment: Attending groups: Yes. Participating in groups:  Yes. Taking medication as prescribed:  Yes. Tolerating medication:  Yes. Family/Significant othe contact made:  Yes, individual(s) contacted:  daughter and son  Patient understands diagnosis:  Yes. Discussing patient identified problems/goals with staff:  Yes. Medical problems stabilized or resolved:  Yes. Denies suicidal/homicidal ideation: Yes. Issues/concerns per patient self-inventory:  Yes. Other:  New problem(s) identified: No, Describe:  NA  Discharge Plan or Barriers:  Reason for Continuation of Hospitalization: Anxiety Medication stabilization Suicidal ideation Other; describe ECT   Comments: Overall he does appear improved from when he first came into the hospital and is making more of an effort to participate. He is eating better. Still however has a flat dysphoric affect gives up easily and is frequently very frustrated. Patient had agreed to ECT and had his first right unilateral ECT treatment today which went off without complication  Estimated length of stay: 10-14 days   New goal(s): NA  Review of initial/current patient goals per problem list:   1.  Goal(s): Patient will participate in aftercare plan * Met:  * Target date: at discharge * As evidenced by: Patient will participate within aftercare plan AEB aftercare provider and housing plan at discharge being identified.   2.  Goal (s): Patient will exhibit decreased depressive symptoms and suicidal ideations. * Met:  *  Target date: at discharge * As evidenced by: Patient will utilize self rating of depression at 3 or below and demonstrate decreased signs of depression or be deemed stable for discharge by MD.   3.  Goal(s): Patient will demonstrate decreased signs and symptoms of anxiety. * Met:  * Target date: at discharge * As evidenced by:  Patient will utilize self rating of anxiety at 3 or below and demonstrated decreased signs of anxiety, or be deemed stable for discharge by MD  4.  Goal (s): Patient will demonstrate decreased symptoms of psychosis. * Met: No  *  Target date: at discharge * As evidenced by: Patient will not endorse signs of psychosis or be deemed stable for discharge by MD.   Attendees: Patient:  Dillon [name] 4/6/20174:02 PM  Family:   4/6/20174:02 PM  Physician:  Dr. Weber Cooks  4/6/20174:02 PM  Nursing:   Meredith Mody, RN  4/6/20174:02 PM  Case Manager:   4/6/20174:02 PM  Counselor:   4/6/20174:02 PM  Other:  Wray Kearns, [place] 4/6/20174:02 PM  Other:  Everitt Amber, [place]  4/6/20174:02 PM  Other:   4/6/20174:02 PM  Other:  4/6/20174:02 PM  Other:  4/6/20174:02 PM  Other:  4/6/20174:02 PM  Other:  4/6/20174:02 PM  Other:  4/6/20174:02 PM  Other:  4/6/20174:02 PM  Other:   4/6/20174:02 PM   Scribe for Treatment Team:   Wray Kearns, MSW, [place]  01/21/2016, 4:02 PM

## 2016-01-21 NOTE — [facility] Group Notes (Signed)
[facility] Group Notes:  (Nursing/MHT/Case Management/Adjunct)  Date:  01/21/2016  Time:  4:53 AM  Type of Therapy:  Group Therapy  Participation Level:  Active  Participation Quality:  Appropriate  Affect:  Appropriate  Cognitive:  Appropriate  Insight:  Good and Improving  Engagement in Group:  Developing/Improving  Modes of Intervention:  n/a  Summary of Progress/Problems:  Dillon [name] 01/21/2016, 4:53 AM

## 2016-01-21 NOTE — Anesthesia Postprocedure Evaluation (Signed)
Anesthesia Post Note  Patient: Richardo PriestRobert H Kosman  Procedure(s) Performed: * No procedures listed *  Patient location during evaluation: PACU Anesthesia Type: General Level of consciousness: awake and alert Pain management: pain level controlled Vital Signs Assessment: post-procedure vital signs reviewed and stable Respiratory status: spontaneous breathing, nonlabored ventilation, respiratory function stable and patient connected to nasal cannula oxygen Cardiovascular status: blood pressure returned to baseline and stable Postop Assessment: no signs of nausea or vomiting Anesthetic complications: no    Last Vitals:  Filed Vitals:   01/20/16 1700 01/21/16 0656  BP: 105/62 112/68  Pulse: 68 63  Temp: 36.7 C 36.7 C  Resp: 18 18    Last Pain:  Filed Vitals:   01/21/16 1024  PainSc: 0-No pain                 Lenard SimmerAndrew Hobson Lax

## 2016-01-21 NOTE — Progress Notes (Signed)
[facility] MD Progress Note  01/21/2016 4:32 PM Dillon [name]  MRN:  [id number]  Subjective: Follow-up for patient with severe depression now starting ECT in addition to medication. First ECT treatment yesterday was tolerated well. Patient is not aware of any confusion or pain or problems after the treatment. Today he says he is continuing to feel better. He is eating very well. Strength is improving a little bit. He denies feeling depressed and denies any suicidal thoughts. Stable Principal Problem: Severe recurrent major depression without psychotic features (HCC) Diagnosis:   Patient Active Problem List   Diagnosis Date Noted  . Suicidal ideation [R45.851] 01/04/2016  . Severe recurrent major depression without psychotic features (HCC) [F33.2] 12/15/2015  . Grief [F43.21] 12/15/2015  . Weight loss [R63.4] 12/15/2015  . OCD (obsessive compulsive disorder) [F42.9] 12/15/2015  . Benign prostatic hyperplasia with urinary obstruction [N40.1] 11/24/2015  . 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  Past Medical History:  Past Medical History  Diagnosis Date  . Meningitis     spinal  . OCD (obsessive compulsive disorder)   . Kidney stones     Past Surgical History  Procedure Laterality Date  . Kidney stone surgery     Family History:  Family History  Problem Relation Age of Onset  . Cancer Mother     bladder  . Emphysema Father   . Emphysema Sister   . COPD Sister   . Cancer Brother     prostate   Family Psychiatric  History: None reported.  Social History:  History  Alcohol Use No     History  Drug Use No    Social History   Social History  . Marital Status: Widowed    Spouse Name: N/A  . Number of Children: N/A  . Years of Education: N/A   Social History Main Topics  . Smoking status: Never Smoker   . Smokeless tobacco: Never Used  . Alcohol Use: No  .  Drug Use: No  . Sexual Activity: Not Currently   Other Topics Concern  . None   Social History Narrative   Additional Social History:    History of alcohol / drug use?: No history of alcohol / drug abuse                    Sleep: Fair  Appetite:  Poor  Current Medications: Current Facility-Administered Medications  Medication Dose Route Frequency Provider Last Rate Last Dose  . acetaminophen (TYLENOL) tablet 650 mg  650 mg Oral Q6H PRN [name], MD      . alum & mag hydroxide-simeth (MAALOX/MYLANTA) 200-200-20 MG/5ML suspension 30 mL  30 mL Oral Q4H PRN [name], MD      . feeding supplement (ENSURE ENLIVE) (ENSURE ENLIVE) liquid 237 mL  237 mL Oral TID BM Jolanta B Pucilowska, MD   237 mL at 01/21/16 1623  . fluvoxaMINE (LUVOX) tablet 100 mg  100 mg Oral QHS Shari Prows, MD   100 mg at 01/20/16 2152  . magnesium hydroxide (MILK OF MAGNESIA) suspension 30 mL  30 mL Oral Daily PRN [name], MD   30 mL at 01/07/16 1458  . megestrol (MEGACE) 400 MG/10ML suspension 400 mg  400 mg Oral BID Shari Prows, MD   400 mg at 01/21/16 1307  . OLANZapine zydis (ZYPREXA) disintegrating tablet 5 mg  5 mg Oral  QHS Darliss Ridgel, MD   5 mg at 01/20/16 2152  . tamsulosin (FLOMAX) capsule 0.4 mg  0.4 mg Oral QPC supper [name], MD   0.4 mg at 01/20/16 1735  . traZODone (DESYREL) tablet 50 mg  50 mg Oral QHS PRN [name], MD        Lab Results: No results found for this or any previous visit (from the past 48 hour(s)).  Blood Alcohol level:  Lab Results  Component Value Date   ETH <5 01/04/2016   ETH <5 12/15/2015    Physical Findings: AIMS: Facial and Oral Movements Muscles of Facial Expression: None, normal Lips and Perioral Area: None, normal Jaw: None, normal Tongue: None, normal,Extremity Movements Upper (arms, wrists, hands, fingers): None, normal Lower (legs, knees, ankles, toes): None, normal, Trunk Movements Neck, shoulders,  hips: None, normal, Overall Severity Severity of abnormal movements (highest score from questions above): None, normal Incapacitation due to abnormal movements: None, normal Patient's awareness of abnormal movements (rate only patient's report): No Awareness, Dental Status Current problems with teeth and/or dentures?: Yes Does patient usually wear dentures?: No  CIWA:    COWS:     Musculoskeletal: Strength & Muscle Tone: decreased Gait & Station: unsteady Patient leans: N/A  Psychiatric Specialty Exam: Review of Systems  Constitutional: Positive for malaise/fatigue.  Psychiatric/Behavioral: Positive for depression.  All other systems reviewed and are negative.   Blood pressure 112/68, pulse 63, temperature 98 F (36.7 C), temperature source Oral, resp. rate 18, height  (1.753 m), weight 72.576 kg (160 lb), SpO2 99 %.Body mass index is 23.62 kg/(m^2).  General Appearance: Fairly Groomed  Patent attorney::  Fair  Speech:  Slow  Volume:  Decreased  Mood:  Anxious, Depressed, Hopeless and Worthless  Affect:  Blunt  Thought Process:  Goal Directed  Orientation:  Full (Time, Place, and Person)  Thought Content:  WDL  Suicidal Thoughts:  Yes.  with intent/plan  Homicidal Thoughts:  No  Memory:  Immediate;   Fair Recent;   Fair Remote;   Fair  Judgement:  Poor  Insight:  Shallow  Psychomotor Activity:  Psychomotor Retardation  Concentration:  Fair  Recall:  Fair  Fund of Knowledge:Fair  Language: Fair  Akathisia:  No  Handed:  Right  AIMS (if indicated):     Assets:  Communication Skills Desire for Improvement Financial Resources/Insurance Housing Physical Health Resilience Social Support  ADL's:  Intact  Cognition: WNL  Sleep:  Number of Hours: 7.15   Treatment Plan Summary: Daily contact with patient to assess and evaluate symptoms and progress in treatment and Medication management   Dillon [name] is a 65 year old male with a history of anxiety and depression  admitted for worsening of depression, malaise and severe weight loss in the context of recent major loss.  1. Suicidal ideation. The patient is passively suicidal unable to function. He is on 15 min checks.    2. Mood/anxietyand psychosis.Continue Luvox for depression and OCD and Zyprexa for psychosis. This was lowered to 5 mg yesterday due to hypertonia.  3. Insomnia. He responded well to trazodone.   4. BPH. We continued Flomax.  5. Failure to thrive. We offered Megace and Ensure. Medicine input is greatly appreciated. Abdomen CT scan was unremarkable.  6. Cognitive decline. Psychological testing indicates unspecified neurocognitive disorder. Please see psychological consultation.  7. Metabolic syndrome monitoring. Lipid profile, TSH, and Hemoglobin A1c are normal. Prolactin 29.5.  8. R/O Vitamin B12 deficiency. Folic acid and vitamin  B12 are normal.  9. Deconditioning. PT help is appreciated.   10. ECT.continue 3 times a week ECT next treatment scheduled for Wednesday. Reviewed labs and EKG. No contraindication to continuing. We'll monitor daily. No change to his medicine for now for the depression which seems to be potentially helping. Patient's blood pressures staying on the low side but he is mostly staying in a wheelchair. e.   11. Fall. Head CT scan and left knee x-ray were negative.   12. Disposition. The patient will be discharged to home with family. He will follow-up with [facility].   Patient has neck ECT treatment scheduled for tomorrow morning. Reviewed plan with him. No change to another medicine. Continue to try to have physical therapy work with him improving his strength. Possibly looking at a length of stay of 7-10 days or so. We will hope that we can make some arrangements for maintenance ECT once he is ready to leave the hospital. Patient agreeable to plan.   Mordecai RasmussenJohn Lotus Gover, MD 01/21/2016, 4:32 PM

## 2016-01-22 ENCOUNTER — Inpatient Hospital Stay: Payer: BLUE CROSS/BLUE SHIELD

## 2016-01-22 ENCOUNTER — Encounter: Payer: Self-pay | Admitting: *Deleted

## 2016-01-22 ENCOUNTER — Other Ambulatory Visit: Payer: Self-pay | Admitting: Psychiatry

## 2016-01-22 ENCOUNTER — Inpatient Hospital Stay: Payer: BLUE CROSS/BLUE SHIELD | Admitting: Anesthesiology

## 2016-01-22 MED ORDER — METHOHEXITAL SODIUM 100 MG/10ML IV SOSY
PREFILLED_SYRINGE | INTRAVENOUS | Status: DC | PRN
  Administered 2016-01-22: 70 mg via INTRAVENOUS

## 2016-01-22 MED ORDER — SODIUM CHLORIDE 0.9 % IV SOLN
INTRAVENOUS | Status: DC | PRN
  Administered 2016-01-22: 12:00:00 via INTRAVENOUS

## 2016-01-22 MED ORDER — SUCCINYLCHOLINE CHLORIDE 20 MG/ML IJ SOLN
INTRAMUSCULAR | Status: DC | PRN
  Administered 2016-01-22: 80 mg via INTRAVENOUS

## 2016-01-22 MED ORDER — SODIUM CHLORIDE 0.9 % IV SOLN
250.0000 mL | Freq: Once | INTRAVENOUS | Status: AC
  Administered 2016-01-22: 500 mL via INTRAVENOUS

## 2016-01-22 NOTE — Procedures (Signed)
ECT SERVICES Physician's Interval Evaluation & Treatment Note  Patient Identification: Dillon PriestRobert H [name] MRN:  [id number] Date of Evaluation:  01/22/2016 TX #: [id number]  MADRS:   MMSE:   P.E. Findings:  No change to physical exam vital signs stable.  Psychiatric Interval Note:  Mood still depressed denies suicidal ideation affect still blunted  Subjective:  Patient is a 65 y.o. male seen for evaluation for Electroconvulsive Therapy. No specific new complaint  Treatment Summary:   [x]   Right Unilateral             []  Bilateral   % Energy : 0.3 ms 60%   Impedance: 1490 ohms  Seizure Energy Index: 4542 V squared  Postictal Suppression Index: 86%  Seizure Concordance Index: 95%  Medications  Pre Shock: Brevital 70 mg, succinylcholine 80 mg  Post Shock:    Seizure Duration: 29 seconds by EMG, 52 seconds by EEG   Comments: Follow-up Monday and Wednesday next week.   Lungs:  [x]   Clear to auscultation               []  Other:   Heart:    [x]   Regular rhythm             []  irregular rhythm    [x]   Previous H&P reviewed, patient examined and there are NO CHANGES                 []   Previous H&P reviewed, patient examined and there are changes noted.   Dillon RasmussenJohn Johanna Matto, MD 4/7/201711:45 AM

## 2016-01-22 NOTE — Plan of Care (Signed)
Problem: Alteration in mood Goal: LTG-Patient reports reduction in suicidal thoughts (Patient reports reduction in suicidal thoughts and is able to verbalize a safety plan for whenever patient is feeling suicidal)  Outcome: Progressing Patient denies suicidal ideation currently

## 2016-01-22 NOTE — Progress Notes (Signed)
D:  Patient is alert and oriented on the unit this shift.  Patient attended and minimally participated in groups today.  Patient denies suicidal ideation, homicidal ideation, auditory or visual hallucinations at the present time.  Patient underwent ECT today.  Patient ate his lunch and attended afternoon group after returning to the unit. A:  Scheduled medications are administered to patient as per MD orders.  Emotional support and encouragement are provided.  Patient is maintained on q.15 minute safety checks.  Patient is informed to notify staff with questions or concerns. R:  No adverse medication reactions are noted.  Patient is cooperative with medication administration and treatment plan today.  Patient is receptive, calm and cooperative on the unit at this time.  Patient interacts minimally with others on the unit this shift.  Patient contracts for safety at this time.  Patient remains safe at this time.

## 2016-01-22 NOTE — Transfer of Care (Signed)
Immediate Anesthesia Transfer of Care Note  Patient: Dillon [name]  Procedure(s) Performed: ect  Patient Location: PACU  Anesthesia Type:General  Level of Consciousness: awake  Airway & Oxygen Therapy: Patient Spontanous Breathing and Patient connected to face mask oxygen  Post-op Assessment: Report given to RN  Post vital signs: Reviewed  Last Vitals:  Filed Vitals:   01/22/16 0923 01/22/16 1206  BP: 134/81 133/97  Pulse: 49 77  Temp: 36.4 C 36.8 C  Resp: 16 17    Complications: No apparent anesthesia complications

## 2016-01-22 NOTE — Anesthesia Postprocedure Evaluation (Signed)
Anesthesia Post Note  Patient: Dillon PriestRobert H [name]  Procedure(s) Performed: * No procedures listed *  Patient location during evaluation: PACU Anesthesia Type: General Level of consciousness: awake and alert Pain management: pain level controlled Vital Signs Assessment: post-procedure vital signs reviewed and stable Respiratory status: spontaneous breathing and respiratory function stable Cardiovascular status: stable Anesthetic complications: no    Last Vitals:  Filed Vitals:   01/22/16 1226 01/22/16 1236  BP: 121/89 115/85  Pulse: 79 73  Temp:    Resp: 20 20    Last Pain:  Filed Vitals:   01/22/16 1237  PainSc: 0-No pain                 KEPHART,WILLIAM K

## 2016-01-22 NOTE — Progress Notes (Signed)
Recreation Therapy Notes  Date: 04.07.17 Time: 9:30 am Location: Craft Room  Group Topic: Coping Skills  Goal Area(s) Addresses:  Patient will effectively use art as a Associate Professorcoping skill. Patient will be able to identify one emotion experienced during group session. Patient will identify use of art as a coping skill.  Behavioral Response: Did not attend  Intervention: Two Faces of Me  Activity: Patients were given a blank face worksheet and instructed to draw a line down the middle. On one side, patients were instructed to draw or write how they felt when they were admitted to the hospital and draw or write how they want to feel on the other side of the face.  Education: LRT educated patients on healthy coping skills.  Education Outcome: Patient did not attend group.  Clinical Observations/Feedback: Patient did not attend group.  Jacquelynn CreeGreene,Hasset Chaviano M, LRT/CTRS 01/22/2016 12:25 PM

## 2016-01-22 NOTE — H&P (Signed)
Dillon PriestRobert H [name] is an 65 y.o. male.   Chief Complaint: Patient continues to have some depression decreased energy decreased motivation. Denies suicidal ideation. HPI: Severe recurrent major depression currently receiving ECT because of failure of prior medication treatment  Past Medical History  Diagnosis Date  . Meningitis     spinal  . OCD (obsessive compulsive disorder)   . Kidney stones     Past Surgical History  Procedure Laterality Date  . Kidney stone surgery      Family History  Problem Relation Age of Onset  . Cancer Mother     bladder  . Emphysema Father   . Emphysema Sister   . COPD Sister   . Cancer Brother     prostate   Social History:  reports that he has never smoked. He has never used smokeless tobacco. He reports that he does not drink alcohol or use illicit drugs.  Allergies:  Allergies  Allergen Reactions  . Codeine Other (See Comments)    Unsure of reaction    Medications Prior to Admission  Medication Sig Dispense Refill  . tamsulosin (FLOMAX) 0.4 MG CAPS capsule Take 1 capsule (0.4 mg total) by mouth 2 (two) times daily. 30 capsule 0    No results found for this or any previous visit (from the past 48 hour(s)). No results found.  Review of Systems  Constitutional: Negative.   HENT: Negative.   Eyes: Negative.   Respiratory: Negative.   Cardiovascular: Negative.   Gastrointestinal: Negative.   Musculoskeletal: Negative.   Skin: Negative.   Neurological: Negative.   Psychiatric/Behavioral: Positive for depression. Negative for suicidal ideas, hallucinations, memory loss and substance abuse. The patient is not nervous/anxious and does not have insomnia.     Blood pressure 134/81, pulse 49, temperature 97.5 F (36.4 C), temperature source Oral, resp. rate 16, height 5\' 9"  (1.753 m), weight 73.029 kg (161 lb), SpO2 100 %. Physical Exam  Nursing note and vitals reviewed. Constitutional: He appears well-developed and well-nourished.  HENT:   Head: Normocephalic and atraumatic.  Eyes: Conjunctivae are normal. Pupils are equal, round, and reactive to light.  Neck: Normal range of motion.  Cardiovascular: Normal rate, regular rhythm and normal heart sounds.   Respiratory: Effort normal and breath sounds normal. No respiratory distress.  GI: Soft.  Musculoskeletal: Normal range of motion.  Neurological: He is alert.  Skin: Skin is warm and dry.  Psychiatric: Judgment and thought content normal. His speech is delayed. He is slowed. Cognition and memory are normal. He exhibits a depressed mood.     Assessment/Plan Treatment scheduled for Monday and Wednesday next week as well continue current treatment plan no other medicine changes at this time.  Mordecai RasmussenJohn Clapacs, MD 01/22/2016, 11:43 AM

## 2016-01-22 NOTE — Progress Notes (Signed)
[facility] MD Progress Note  01/22/2016 7:02 PM Dillon [name]  MRN:  [id number]  Subjective: Follow-up Friday the seventh. Patient had second ECT treatment today. Tolerated well. He reports that he thinks he is feeling a little bit better. He denies feeling depressed and denies suicidal ideation. He is eating well. Although when I saw him this evening he was in bed he says that he has been trying to get up and go to some groups. He still doesn't have much that he can articulate about what he does at home but certainly doesn't seem nearly as negative. Blood pressure appears to be more stable. His strength is improved and he looks like he is able to stand up without dizziness much more easily. Principal Problem: Severe recurrent major depression without psychotic features (HCC) Diagnosis:   Patient Active Problem List   Diagnosis Date Noted  . Suicidal ideation [R45.851] 01/04/2016  . Severe recurrent major depression without psychotic features (HCC) [F33.2] 12/15/2015  . Grief [F43.21] 12/15/2015  . Weight loss [R63.4] 12/15/2015  . OCD (obsessive compulsive disorder) [F42.9] 12/15/2015  . Benign prostatic hyperplasia with urinary obstruction [N40.1] 11/24/2015  . 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  Past Medical History:  Past Medical History  Diagnosis Date  . Meningitis     spinal  . OCD (obsessive compulsive disorder)   . Kidney stones     Past Surgical History  Procedure Laterality Date  . Kidney stone surgery     Family History:  Family History  Problem Relation Age of Onset  . Cancer Mother     bladder  . Emphysema Father   . Emphysema Sister   . COPD Sister   . Cancer Brother     prostate   Family Psychiatric  History: None reported.  Social History:  History  Alcohol Use No     History  Drug Use No    Social History   Social History  . Marital  Status: Widowed    Spouse Name: N/A  . Number of Children: N/A  . Years of Education: N/A   Social History Main Topics  . Smoking status: Never Smoker   . Smokeless tobacco: Never Used  . Alcohol Use: No  . Drug Use: No  . Sexual Activity: Not Currently   Other Topics Concern  . None   Social History Narrative   Additional Social History:    History of alcohol / drug use?: No history of alcohol / drug abuse                    Sleep: Fair  Appetite:  Poor  Current Medications: Current Facility-Administered Medications  Medication Dose Route Frequency Provider Last Rate Last Dose  . acetaminophen (TYLENOL) tablet 650 mg  650 mg Oral Q6H PRN [name], MD      . alum & mag hydroxide-simeth (MAALOX/MYLANTA) 200-200-20 MG/5ML suspension 30 mL  30 mL Oral Q4H PRN [name], MD      . feeding supplement (ENSURE ENLIVE) (ENSURE ENLIVE) liquid 237 mL  237 mL Oral TID BM Jolanta B Pucilowska, MD   237 mL at 01/22/16 1311  . fluvoxaMINE (LUVOX) tablet 100 mg  100 mg Oral QHS Jolanta B Pucilowska, MD   100 mg at 01/21/16 2249  . magnesium hydroxide (MILK OF MAGNESIA) suspension 30 mL  30 mL Oral Daily PRN John T Clapacs,  MD   30 mL at 01/07/16 1458  . megestrol (MEGACE) 400 MG/10ML suspension 400 mg  400 mg Oral BID Jolanta B Pucilowska, MD   400 mg at 01/22/16 1308  . OLANZapine zydis (ZYPREXA) disintegrating tablet 5 mg  5 mg Oral QHS Darliss RidgelAarti K Kapur, MD   5 mg at 01/21/16 2250  . tamsulosin (FLOMAX) capsule 0.4 mg  0.4 mg Oral QPC supper [name], MD   0.4 mg at 01/22/16 1723  . traZODone (DESYREL) tablet 50 mg  50 mg Oral QHS PRN [name], MD        Lab Results: No results found for this or any previous visit (from the past 48 hour(s)).  Blood Alcohol level:  Lab Results  Component Value Date   ETH <5 01/04/2016   ETH <5 12/15/2015    Physical Findings: AIMS: Facial and Oral Movements Muscles of Facial Expression: None, normal Lips and Perioral  Area: None, normal Jaw: None, normal Tongue: None, normal,Extremity Movements Upper (arms, wrists, hands, fingers): None, normal Lower (legs, knees, ankles, toes): None, normal, Trunk Movements Neck, shoulders, hips: None, normal, Overall Severity Severity of abnormal movements (highest score from questions above): None, normal Incapacitation due to abnormal movements: None, normal Patient's awareness of abnormal movements (rate only patient's report): No Awareness, Dental Status Current problems with teeth and/or dentures?: Yes Does patient usually wear dentures?: No  CIWA:    COWS:     Musculoskeletal: Strength & Muscle Tone: decreased Gait & Station: unsteady Patient leans: N/A  Psychiatric Specialty Exam: Review of Systems  Constitutional: Positive for malaise/fatigue.  Psychiatric/Behavioral: Positive for depression.  All other systems reviewed and are negative.   Blood pressure 115/85, pulse 73, temperature 98.2 F (36.8 C), temperature source Temporal, resp. rate 20, height 5\' 9"  (1.753 m), weight 73.029 kg (161 lb), SpO2 96 %.Body mass index is 23.76 kg/(m^2).  General Appearance: Fairly Groomed  Patent attorneyye Contact::  Fair  Speech:  Slow  Volume:  Decreased  Mood:  Anxious, Depressed, Hopeless and Worthless  Affect:  Blunt  Thought Process:  Goal Directed  Orientation:  Full (Time, Place, and Person)  Thought Content:  WDL  Suicidal Thoughts:  Yes.  with intent/plan  Homicidal Thoughts:  No  Memory:  Immediate;   Fair Recent;   Fair Remote;   Fair  Judgement:  Poor  Insight:  Shallow  Psychomotor Activity:  Psychomotor Retardation  Concentration:  Fair  Recall:  Fair  Fund of Knowledge:Fair  Language: Fair  Akathisia:  No  Handed:  Right  AIMS (if indicated):     Assets:  Communication Skills Desire for Improvement Financial Resources/Insurance Housing Physical Health Resilience Social Support  ADL's:  Intact  Cognition: WNL  Sleep:  Number of Hours: 7.15    Treatment Plan Summary: Daily contact with patient to assess and evaluate symptoms and progress in treatment and Medication management   Dillon [name] is a 65 year old male with a history of anxiety and depression admitted for worsening of depression, malaise and severe weight loss in the context of recent major loss.  1. Suicidal ideation. The patient is passively suicidal unable to function. He is on 15 min checks.    2. Mood/anxietyand psychosis.Continue Luvox for depression and OCD and Zyprexa for psychosis. This was lowered to 5 mg yesterday due to hypertonia.  3. Insomnia. He responded well to trazodone.   4. BPH. We continued Flomax.  5. Failure to thrive. We offered Megace and Ensure. Medicine input is  greatly appreciated. Abdomen CT scan was unremarkable.  6. Cognitive decline. Psychological testing indicates unspecified neurocognitive disorder. Please see psychological consultation.  7. Metabolic syndrome monitoring. Lipid profile, TSH, and Hemoglobin A1c are normal. Prolactin 29.5.  8. R/O Vitamin B12 deficiency. Folic acid and vitamin B12 are normal.  9. Deconditioning. PT help is appreciated.   10. ECT.continue 3 times a week ECT next treatment scheduled for Wednesday. Reviewed labs and EKG. No contraindication to continuing. We'll monitor daily. No change to his medicine for now for the depression which seems to be potentially helping. Patient's blood pressures staying on the low side but he is mostly staying in a wheelchair. e.   11. Fall. Head CT scan and left knee x-ray were negative.   12. Disposition. The patient will be discharged to home with family. He will follow-up with [facility].   ECT today and tolerated well. Anticipate a follow-up treatment Monday and Wednesday next week. No change to medicine for today. Encourage patient to continue attending groups and getting out of his room. Case reviewed with social work and treatment team. Possible length of stay still in the  range of a week or so. [name], MD 01/22/2016, 7:02 PM

## 2016-01-22 NOTE — Anesthesia Preprocedure Evaluation (Signed)
Anesthesia Evaluation  Patient identified by MRN, date of birth, ID band Patient awake    Reviewed: Allergy & Precautions, H&P , NPO status , Patient's Chart, lab work & pertinent test results, reviewed documented beta blocker date and time   History of Anesthesia Complications (+) PROLONGED EMERGENCE and history of anesthetic complications  Airway Mallampati: III  TM Distance: >3 FB Neck ROM: full  Mouth opening: Limited Mouth Opening  Dental no notable dental hx. (+) Poor Dentition, Missing, Chipped   Pulmonary neg pulmonary ROS,    Pulmonary exam normal breath sounds clear to auscultation       Cardiovascular Exercise Tolerance: Good negative cardio ROS Normal cardiovascular exam Rhythm:regular Rate:Normal     Neuro/Psych PSYCHIATRIC DISORDERS (Depression and OCD) negative neurological ROS     GI/Hepatic Neg liver ROS, GERD  ,  Endo/Other  negative endocrine ROS  Renal/GU Renal disease (kidney stones)  negative genitourinary   Musculoskeletal   Abdominal   Peds  Hematology negative hematology ROS (+)   Anesthesia Other Findings Past Medical History:   Meningitis                                                     Comment:spinal   OCD (obsessive compulsive disorder)                          Kidney stones                                                Reproductive/Obstetrics negative OB ROS                             Anesthesia Physical  Anesthesia Plan  ASA: III  Anesthesia Plan: General   Post-op Pain Management:    Induction:   Airway Management Planned:   Additional Equipment:   Intra-op Plan:   Post-operative Plan:   Informed Consent: I have reviewed the patients History and Physical, chart, labs and discussed the procedure including the risks, benefits and alternatives for the proposed anesthesia with the patient or authorized representative who has indicated  his/her understanding and acceptance.   Dental Advisory Given  Plan Discussed with: Anesthesiologist, CRNA and Surgeon  Anesthesia Plan Comments:         Anesthesia Quick Evaluation

## 2016-01-22 NOTE — Progress Notes (Signed)
[facility] MD Progress Note  01/22/2016 7:42 PM MARCELLIUS [name]  MRN:  [id number]  Subjective:  Dillon [name] feels more energetic today. He has no complaints about ECT treatment, no headache. He considers it a pleasant experience and is very optimistic about the results. He reports much better appetite. He has been drinking fluids adequately. His mood is improving, affect is brighter, he no longer is suicidal but still times when he "doesn't care. His family has been very supportive. He accepts medications and tolerates them well. He still uses a wheelchair. He participates in programming.  Principal Problem: Severe recurrent major depression without psychotic features (HCC) Diagnosis:   Patient Active Problem List   Diagnosis Date Noted  . Suicidal ideation [R45.851] 01/04/2016  . Severe recurrent major depression without psychotic features (HCC) [F33.2] 12/15/2015  . Grief [F43.21] 12/15/2015  . Weight loss [R63.4] 12/15/2015  . OCD (obsessive compulsive disorder) [F42.9] 12/15/2015  . Benign prostatic hyperplasia with urinary obstruction [N40.1] 11/24/2015  . 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  Past Medical History:  Past Medical History  Diagnosis Date  . Meningitis     spinal  . OCD (obsessive compulsive disorder)   . Kidney stones     Past Surgical History  Procedure Laterality Date  . Kidney stone surgery     Family History:  Family History  Problem Relation Age of Onset  . Cancer Mother     bladder  . Emphysema Father   . Emphysema Sister   . COPD Sister   . Cancer Brother     prostate   Family Psychiatric  History: None reported. Social History:  History  Alcohol Use No     History  Drug Use No    Social History   Social History  . Marital Status: Widowed    Spouse Name: N/A  . Number of Children: N/A  . Years of Education: N/A   Social  History Main Topics  . Smoking status: Never Smoker   . Smokeless tobacco: Never Used  . Alcohol Use: No  . Drug Use: No  . Sexual Activity: Not Currently   Other Topics Concern  . None   Social History Narrative   Additional Social History:    History of alcohol / drug use?: No history of alcohol / drug abuse                    Sleep: Fair  Appetite:  Fair  Current Medications: Current Facility-Administered Medications  Medication Dose Route Frequency Provider Last Rate Last Dose  . acetaminophen (TYLENOL) tablet 650 mg  650 mg Oral Q6H PRN [name], MD      . alum & mag hydroxide-simeth (MAALOX/MYLANTA) 200-200-20 MG/5ML suspension 30 mL  30 mL Oral Q4H PRN [name], MD      . feeding supplement (ENSURE ENLIVE) (ENSURE ENLIVE) liquid 237 mL  237 mL Oral TID BM Rozanne Heumann B Zailyn Rowser, MD   237 mL at 01/22/16 1311  . fluvoxaMINE (LUVOX) tablet 100 mg  100 mg Oral QHS Nyssa Sayegh B Christin Mccreedy, MD   100 mg at 01/21/16 2249  . magnesium hydroxide (MILK OF MAGNESIA) suspension 30 mL  30 mL Oral Daily PRN [name], MD   30 mL at 01/07/16 1458  . megestrol (MEGACE) 400 MG/10ML suspension 400 mg  400 mg Oral BID Shari Prows, MD   400  mg at 01/22/16 1308  . OLANZapine zydis (ZYPREXA) disintegrating tablet 5 mg  5 mg Oral QHS Darliss RidgelAarti K Kapur, MD   5 mg at 01/21/16 2250  . tamsulosin (FLOMAX) capsule 0.4 mg  0.4 mg Oral QPC supper [name], MD   0.4 mg at 01/22/16 1723  . traZODone (DESYREL) tablet 50 mg  50 mg Oral QHS PRN [name], MD        Lab Results: No results found for this or any previous visit (from the past 48 hour(s)).  Blood Alcohol level:  Lab Results  Component Value Date   ETH <5 01/04/2016   ETH <5 12/15/2015    Physical Findings: AIMS: Facial and Oral Movements Muscles of Facial Expression: None, normal Lips and Perioral Area: None, normal Jaw: None, normal Tongue: None, normal,Extremity Movements Upper (arms, wrists,  hands, fingers): None, normal Lower (legs, knees, ankles, toes): None, normal, Trunk Movements Neck, shoulders, hips: None, normal, Overall Severity Severity of abnormal movements (highest score from questions above): None, normal Incapacitation due to abnormal movements: None, normal Patient's awareness of abnormal movements (rate only patient's report): No Awareness, Dental Status Current problems with teeth and/or dentures?: Yes Does patient usually wear dentures?: No  CIWA:    COWS:     Musculoskeletal: Strength & Muscle Tone: decreased Gait & Station: unsteady Patient leans: N/A  Psychiatric Specialty Exam: Review of Systems  Psychiatric/Behavioral: Positive for depression and suicidal ideas. The patient is nervous/anxious.   All other systems reviewed and are negative.   Blood pressure 115/85, pulse 73, temperature 98.2 F (36.8 C), temperature source Temporal, resp. rate 20, height 5\' 9"  (1.753 m), weight 73.029 kg (161 lb), SpO2 96 %.Body mass index is 23.76 kg/(m^2).  General Appearance: Casual  Eye Contact::  Fair  Speech:  Clear and Coherent  Volume:  Decreased  Mood:  Anxious and Depressed  Affect:  Blunt  Thought Process:  Goal Directed  Orientation:  Full (Time, Place, and Person)  Thought Content:  Delusions and Paranoid Ideation  Suicidal Thoughts:  Yes.  with intent/plan  Homicidal Thoughts:  No  Memory:  Immediate;   Fair Recent;   Fair Remote;   Fair  Judgement:  Fair  Insight:  Shallow  Psychomotor Activity:  Decreased  Concentration:  Fair  Recall:  FiservFair  Fund of Knowledge:Fair  Language: Fair  Akathisia:  No  Handed:  Right  AIMS (if indicated):     Assets:  Communication Skills Desire for Improvement Financial Resources/Insurance Housing Physical Health Resilience Social Support Transportation  ADL's:  Intact  Cognition: WNL  Sleep:  Number of Hours: 7.15   Treatment Plan Summary: Daily contact with patient to assess and evaluate  symptoms and progress in treatment and Medication management   Dillon [name] is a 65 year old male with a history of anxiety and depression admitted for worsening of depression, malaise and severe weight loss in the context of recent major loss.  1. Suicidal ideation. The patient is passively suicidal unable to function. He is on 15 min checks.    2. Mood/anxietyand psychosis.Continue Luvox for depression and OCD and Zyprexa for psychosis. This was lowered to 5 mg yesterday due to hypertonia.  3. Insomnia. He responded well to trazodone.   4. BPH. We continued Flomax.  5. Failure to thrive. We offered Megace and Ensure. Medicine input is greatly appreciated. Abdomen CT scan was unremarkable.  6. Cognitive decline. Psychological testing indicates unspecified neurocognitive disorder. Please see psychological consultation.  7. Metabolic  syndrome monitoring. Lipid profile, TSH, and Hemoglobin A1c are normal. Prolactin 29.5.  8. R/O Vitamin B12 deficiency. Folic acid and vitamin B12 are normal.  9. Deconditioning. PT help is appreciated.   10. ECT.continue 3 times a week ECT next treatment scheduled for Wednesday. Reviewed labs and EKG. No contraindication to continuing. We'll monitor daily. No change to his medicine for now for the depression which seems to be potentially helping. Patient's blood pressures staying on the low side but he is mostly staying in a wheelchair.    11. Fall. Head CT scan and left knee x-ray were negative.   12. Disposition. The patient will be discharged to home with family. He will follow-up with [facility].   No change to medicine for 01/23/2016.  Kristine Linea, MD 01/22/2016, 7:42 PM

## 2016-01-22 NOTE — Plan of Care (Signed)
Problem: Alteration in mood Goal: LTG-Pt's behavior demonstrates decreased signs of depression (Patient's behavior demonstrates decreased signs of depression to the point the patient is safe to return home and continue treatment in an outpatient setting)  Outcome: Progressing Patient reports his mood is slightly better today than it was yesterday

## 2016-01-22 NOTE — [facility] Group Notes (Signed)
[facility] Group Notes:  (Nursing/MHT/Case Management/Adjunct)  Date:  01/22/2016  Time:  3:57 PM  Type of Therapy:  Group Therapy  Participation Level:  Active  Participation Quality:  Appropriate  Affect:  Appropriate  Cognitive:  Appropriate  Insight:  Good  Engagement in Group:  Supportive  Modes of Intervention:  Support  Summary of Progress/Problems:  Mayra NeerJackie L Jordany Russett 01/22/2016, 3:57 PM

## 2016-01-22 NOTE — Plan of Care (Signed)
Problem: Alteration in mood Goal: LTG-Patient reports reduction in suicidal thoughts (Patient reports reduction in suicidal thoughts and is able to verbalize a safety plan for whenever patient is feeling suicidal)  Outcome: Progressing Patient denies SI.      

## 2016-01-22 NOTE — Progress Notes (Signed)
Pt denies SI/HI/AVH. Pt is pleasant and cooperative. Patient's affect is flat and sad, he appears less anxious, but, is not interacting with peers/  A: Pt was offered support and encouragement. Pt was given scheduled medications. Pt was encouraged to attend groups. Q 15 minute checks were done for safety.  R:Pt attends groups and interacts well with peers and staff. Pt is taking medication. Pt has no complaints.Pt receptive to treatment and safety maintained on unit.

## 2016-01-23 NOTE — Progress Notes (Signed)
D: Observed pt in dayroom. Patient alert and oriented x4. Patient denies SI/HI/AVH. Pt affect is flat. When asked about ECT pt stated "I think it's helping." Pt is going to groups, in the dayroom more, and eating snack and full ensures. Pt rated depression 2/10 and anxiety 2/10. Pt talked for while about his family and his past. A: Offered active listening and support. Provided therapeutic communication. Administered scheduled medications. Encouraged pt to share thoughts and feelings with staff. Encouraged pt to continue being active on the unit. R: Pt pleasant and cooperative. Pt medication compliant. Will continue Q15 min. checks. Safety maintained.

## 2016-01-23 NOTE — Plan of Care (Signed)
Problem: Alteration in mood Goal: LTG-Pt's behavior demonstrates decreased signs of depression (Patient's behavior demonstrates decreased signs of depression to the point the patient is safe to return home and continue treatment in an outpatient setting)  Outcome: Progressing Pt more active on the unit, pt more talkative and expressive.

## 2016-01-23 NOTE — Progress Notes (Signed)
Patient with depressed affect, cooperative behavior with meals, meds and plan of care. Uses wheelchair to go to dayroom for meals, then returns to bed to rest. Minimal interaction with peers. Encouraged to attend therapy groups to learn coping skills to manage grief rt loss of wife. No SI/HI at this time. Safety maintained.

## 2016-01-23 NOTE — [facility] Group Notes (Signed)
[facility] LCSW Group Therapy  01/23/2016 1:16 PM  Type of Therapy:  Group Therapy  Participation Level:  Active  Participation Quality:  Attentive  Affect:  Depressed  Cognitive:  Alert  Insight:  Improving  Engagement in Therapy:  Improving  Modes of Intervention:  Discussion, Education, Socialization and Support  Summary of Progress/Problems: Self esteem: Patients discussed self esteem and how it impacts them. They discussed what aspects in their lives has influenced their self esteem. They were challenged to identify changes that are needed in order to improve self esteem.  Delrick attended group and stayed the entire time. He discussed finding people who are positive and who are uplifting.   Reginald Mangels L Dallys Nowakowski MSW, LCSWA  01/23/2016, 1:16 PM

## 2016-01-23 NOTE — Plan of Care (Signed)
Problem: Consults Goal: Depression Patient Education See Patient Education Module for education specifics.  Outcome: Progressing Patient cooperative with meds and ECT plan of care.

## 2016-01-23 NOTE — [facility] Group Notes (Signed)
[facility] Group Notes:  (Nursing/MHT/Case Management/Adjunct)  Date:  01/23/2016  Time:  10:38 AM  Type of Therapy:  goal setting   Participation Level:  Did Not Attend  Dillon [name] 01/23/2016, 10:38 AM 

## 2016-01-24 NOTE — Plan of Care (Signed)
Problem: Alteration in mood Goal: LTG-Pt's behavior demonstrates decreased signs of depression (Patient's behavior demonstrates decreased signs of depression to the point the patient is safe to return home and continue treatment in an outpatient setting)  Outcome: Not Progressing Patient with no SI/HI but does remain with low interest and low energy. Is thinking about maybe taking a shower today.

## 2016-01-24 NOTE — Progress Notes (Signed)
[facility] MD Progress Note  01/24/2016 6:17 AM [name]  MRN:  [id number]  Subjective:  Dillon [name] believes that things are looking up a little bit and is more optimistic about the future. He is looking forward to complete his index series. His children visited and they have been very supportive. In spite of reported improvement, the patient is in bed most of the time. He does get up for meals. Sleep and appetite are fair. He accepts medications and tolerates them well.  Principal Problem: Severe recurrent major depression without psychotic features (HCC) Diagnosis:   Patient Active Problem List   Diagnosis Date Noted  . Suicidal ideation [R45.851] 01/04/2016  . Severe recurrent major depression without psychotic features (HCC) [F33.2] 12/15/2015  . Grief [F43.21] 12/15/2015  . Weight loss [R63.4] 12/15/2015  . OCD (obsessive compulsive disorder) [F42.9] 12/15/2015  . Benign prostatic hyperplasia with urinary obstruction [N40.1] 11/24/2015  . 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  Past Medical History:  Past Medical History  Diagnosis Date  . Meningitis     spinal  . OCD (obsessive compulsive disorder)   . Kidney stones     Past Surgical History  Procedure Laterality Date  . Kidney stone surgery     Family History:  Family History  Problem Relation Age of Onset  . Cancer Mother     bladder  . Emphysema Father   . Emphysema Sister   . COPD Sister   . Cancer Brother     prostate   Family Psychiatric  History: None reported. Social History:  History  Alcohol Use No     History  Drug Use No    Social History   Social History  . Marital Status: Widowed    Spouse Name: N/A  . Number of Children: N/A  . Years of Education: N/A   Social History Main Topics  . Smoking status: Never Smoker   . Smokeless tobacco: Never Used  . Alcohol Use: No  . Drug  Use: No  . Sexual Activity: Not Currently   Other Topics Concern  . None   Social History Narrative   Additional Social History:    History of alcohol / drug use?: No history of alcohol / drug abuse                    Sleep: Fair  Appetite:  Fair  Current Medications: Current Facility-Administered Medications  Medication Dose Route Frequency Provider Last Rate Last Dose  . acetaminophen (TYLENOL) tablet 650 mg  650 mg Oral Q6H PRN [name], MD      . alum & mag hydroxide-simeth (MAALOX/MYLANTA) 200-200-20 MG/5ML suspension 30 mL  30 mL Oral Q4H PRN [name], MD      . feeding supplement (ENSURE ENLIVE) (ENSURE ENLIVE) liquid 237 mL  237 mL Oral TID BM Jolanta B Pucilowska, MD   237 mL at 01/23/16 2107  . fluvoxaMINE (LUVOX) tablet 100 mg  100 mg Oral QHS Shari Prows, MD   100 mg at 01/23/16 2108  . magnesium hydroxide (MILK OF MAGNESIA) suspension 30 mL  30 mL Oral Daily PRN [name], MD   30 mL at 01/07/16 1458  . megestrol (MEGACE) 400 MG/10ML suspension 400 mg  400 mg Oral BID Shari Prows, MD   400 mg at 01/23/16 2108  . OLANZapine zydis (ZYPREXA) disintegrating tablet 5  mg  5 mg Oral QHS Darliss RidgelAarti K Kapur, MD   5 mg at 01/23/16 2108  . tamsulosin (FLOMAX) capsule 0.4 mg  0.4 mg Oral QPC supper [name], MD   0.4 mg at 01/23/16 1632  . traZODone (DESYREL) tablet 50 mg  50 mg Oral QHS PRN [name], MD        Lab Results: No results found for this or any previous visit (from the past 48 hour(s)).  Blood Alcohol level:  Lab Results  Component Value Date   ETH <5 01/04/2016   ETH <5 12/15/2015    Physical Findings: AIMS: Facial and Oral Movements Muscles of Facial Expression: None, normal Lips and Perioral Area: None, normal Jaw: None, normal Tongue: None, normal,Extremity Movements Upper (arms, wrists, hands, fingers): None, normal Lower (legs, knees, ankles, toes): None, normal, Trunk Movements Neck, shoulders, hips:  None, normal, Overall Severity Severity of abnormal movements (highest score from questions above): None, normal Incapacitation due to abnormal movements: None, normal Patient's awareness of abnormal movements (rate only patient's report): No Awareness, Dental Status Current problems with teeth and/or dentures?: Yes Does patient usually wear dentures?: No  CIWA:    COWS:     Musculoskeletal: Strength & Muscle Tone: within normal limits Gait & Station: normal Patient leans: N/A  Psychiatric Specialty Exam: Review of Systems  Psychiatric/Behavioral: Positive for depression. The patient is nervous/anxious.   All other systems reviewed and are negative.   Blood pressure 127/69, pulse 67, temperature 98.2 F (36.8 C), temperature source Oral, resp. rate 20, height 5\' 9"  (1.753 m), weight 73.029 kg (161 lb), SpO2 96 %.Body mass index is 23.76 kg/(m^2).  General Appearance: Casual  Eye Contact::  Minimal  Speech:  Clear and Coherent  Volume:  Normal  Mood:  Depressed  Affect:  Blunt  Thought Process:  Goal Directed  Orientation:  Full (Time, Place, and Person)  Thought Content:  WDL  Suicidal Thoughts:  No  Homicidal Thoughts:  No  Memory:  Immediate;   Fair Recent;   Fair Remote;   Fair  Judgement:  Poor  Insight:  Shallow  Psychomotor Activity:  Psychomotor Retardation  Concentration:  Fair  Recall:  Fair  Fund of Knowledge:Fair  Language: Fair  Akathisia:  No  Handed:  Right  AIMS (if indicated):     Assets:  Communication Skills Desire for Improvement Financial Resources/Insurance Housing Physical Health Resilience Social Support  ADL's:  Intact  Cognition: WNL  Sleep:  Number of Hours: 7.25   Treatment Plan Summary: Daily contact with patient to assess and evaluate symptoms and progress in treatment and Medication management   Dillon [name] is a 65 year old male with a history of anxiety and depression admitted for worsening of depression, malaise and severe  weight loss in the context of recent major loss.  1. Suicidal ideation. The patient is passively suicidal unable to function. He is on 15 min checks.    2. Mood/anxietyand psychosis.Continue Luvox for depression and OCD and Zyprexa for psychosis. This was lowered to 5 mg yesterday due to hypertonia.  3. Insomnia. He responded well to trazodone.   4. BPH. We continued Flomax.  5. Failure to thrive. We offered Megace and Ensure. Medicine input is greatly appreciated. Abdomen CT scan was unremarkable.  6. Cognitive decline. Psychological testing indicates unspecified neurocognitive disorder. Please see psychological consultation.  7. Metabolic syndrome monitoring. Lipid profile, TSH, and Hemoglobin A1c are normal. Prolactin 29.5.  8. R/O Vitamin B12 deficiency. Folic acid and  vitamin B12 are normal.  9. Deconditioning. PT help is appreciated.   10. ECT.continue 3 times a week ECT next treatment scheduled for Wednesday. Reviewed labs and EKG. No contraindication to continuing. We'll monitor daily. No change to his medicine for now for the depression which seems to be potentially helping. Patient's blood pressures staying on the low side but he is mostly staying in a wheelchair.   11. Fall. Head CT scan and left knee x-ray were negative.   12. Disposition. The patient will be discharged to home with family. He will follow-up with [facility].   No change to medicine for 01/24/2016.   Kristine Linea, MD 01/24/2016, 6:17 AM

## 2016-01-24 NOTE — [facility] Group Notes (Signed)
[facility] Group Notes:  (Nursing/MHT/Case Management/Adjunct)  Date:  01/24/2016  Time:  8:58 AM  Type of Therapy:  Goal setting   Participation Level:  Did Not Attend  Dillon [name] 01/24/2016, 8:58 AM

## 2016-01-24 NOTE — Progress Notes (Signed)
Patient with depressed affect, withdrawn behavior. Denies SI/HI, but remains with low interest, low energy. Patient encouraged to shower today and/or wash clothes. Patient states he is thinking about it. Does eat meals in dayroom. Remains fall risk, using wheelchair to transfer to dayroom. Nutritional shakes encouraged and tolerated well. Safety maintained.

## 2016-01-24 NOTE — [facility] Group Notes (Signed)
[facility] LCSW Group Therapy  01/24/2016 4:24 PM   Type of Therapy:  Group Therapy  Participation Level:  Did Not Attend  Modes of Intervention:  Discussion, Education, Socialization and Support  Summary of Progress/Problems:. Todays topic: Grudges  Patients will be encouraged to discuss their thoughts, feelings, and behaviors as to why one holds on to grudges and reasons why people have grudges. Patients will process the impact of grudges on their daily lives and identify thoughts and feelings related to holding grudges. Patients will identify feelings and thoughts related to what life would look like without grudges.   Kaylob Wallen L Sherryll Skoczylas MSW, LCSWA  01/24/2016, 4:25 PM     

## 2016-01-24 NOTE — Progress Notes (Signed)
Patient isolates to room. Patient states that he does not want to be a bother to anyone. Patient compliant with medications. Patient asked for sandwich and was provided with sandwich tray. Patient stated that his appetite is increasing. Safety maintained during shift.

## 2016-01-25 ENCOUNTER — Inpatient Hospital Stay: Payer: BLUE CROSS/BLUE SHIELD

## 2016-01-25 ENCOUNTER — Encounter: Payer: Self-pay | Admitting: *Deleted

## 2016-01-25 ENCOUNTER — Inpatient Hospital Stay: Payer: BLUE CROSS/BLUE SHIELD | Admitting: Anesthesiology

## 2016-01-25 MED ORDER — METHOHEXITAL SODIUM 100 MG/10ML IV SOSY
PREFILLED_SYRINGE | INTRAVENOUS | Status: DC | PRN
  Administered 2016-01-25: 70 mg via INTRAVENOUS

## 2016-01-25 MED ORDER — SODIUM CHLORIDE 0.9 % IV SOLN
INTRAVENOUS | Status: DC | PRN
  Administered 2016-01-25: 12:00:00 via INTRAVENOUS

## 2016-01-25 MED ORDER — SODIUM CHLORIDE 0.9 % IV SOLN
250.0000 mL | Freq: Once | INTRAVENOUS | Status: AC
  Administered 2016-01-25: 500 mL via INTRAVENOUS

## 2016-01-25 MED ORDER — SUCCINYLCHOLINE CHLORIDE 20 MG/ML IJ SOLN
INTRAMUSCULAR | Status: DC | PRN
  Administered 2016-01-25: 80 mg via INTRAVENOUS

## 2016-01-25 MED ORDER — ESMOLOL HCL 100 MG/10ML IV SOLN
INTRAVENOUS | Status: DC | PRN
  Administered 2016-01-25: 40 mg via INTRAVENOUS

## 2016-01-25 MED ORDER — DOCUSATE SODIUM 100 MG PO CAPS
200.0000 mg | ORAL_CAPSULE | Freq: Two times a day (BID) | ORAL | Status: DC
  Administered 2016-01-25 – 2016-01-29 (×8): 200 mg via ORAL
  Filled 2016-01-25 (×9): qty 2

## 2016-01-25 MED ORDER — MAGNESIUM CITRATE PO SOLN
1.0000 | Freq: Once | ORAL | Status: DC
  Filled 2016-01-25: qty 296

## 2016-01-25 NOTE — [facility] Group Notes (Signed)
[facility] Group Notes:  (Nursing/MHT/Case Management/Adjunct)  Date:  01/25/2016  Time:  9:47 PM  Type of Therapy:  Evening Wrap-up Group  Participation Level:  Did Not Attend  Participation Quality:  N/A  Affect:  N/A  Cognitive:  N/A  Insight:  None  Engagement in Group:  N/A  Modes of Intervention:  Discussion  Summary of Progress/Problems:  Tomasita MorrowChelsea Nanta Cherika Jessie 01/25/2016, 9:47 PM

## 2016-01-25 NOTE — Progress Notes (Signed)
D: Observed pt in room sleeping. Patient alert and oriented x4. Patient denies SI/HI/AVH. Pt affect is flat., but brightens at times. Pt stated his mood is "ok...my eating is picking up." Pt did not attend wrap-up group, but affect still appears to be improving. Pt talked again with writer about family at length, pt mentioned that he has been ruminating less on his wife's death. Pt had no complaints. A: Offered active listening and support. Provided therapeutic communication. Administered scheduled medications.  R: Pt pleasant and cooperative. Pt medication compliant. Will continue Q15 min. checks. Safety maintained.

## 2016-01-25 NOTE — [facility] Group Notes (Signed)
[facility] LCSW Group Therapy  01/25/2016 4:00 PM  Type of Therapy:  Group Therapy  Participation Level:  Did Not Attend MAY HAVE BEEN AT ECT DURING GROUP TIME  Helaine Yackel, Cleda DaubSara P, MSW, LCSW 01/25/2016, 4:00 PM

## 2016-01-25 NOTE — Progress Notes (Signed)
[facility] MD Progress Note  01/25/2016 7:16 PM Dillon [name]  MRN:  [id number]  Subjective: ECT once again tolerated well today. He feels like he is having a lot of constipation today however. That's his chief complaint. Mood feels "in the middle". Denies suicidal thoughts.  Principal Problem: Severe recurrent major depression without psychotic features (HCC) Diagnosis:   Patient Active Problem List   Diagnosis Date Noted  . Suicidal ideation [R45.851] 01/04/2016  . Severe recurrent major depression without psychotic features (HCC) [F33.2] 12/15/2015  . Grief [F43.21] 12/15/2015  . Weight loss [R63.4] 12/15/2015  . OCD (obsessive compulsive disorder) [F42.9] 12/15/2015  . Benign prostatic hyperplasia with urinary obstruction [N40.1] 11/24/2015  . 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  Past Medical History:  Past Medical History  Diagnosis Date  . Meningitis     spinal  . OCD (obsessive compulsive disorder)   . Kidney stones     Past Surgical History  Procedure Laterality Date  . Kidney stone surgery     Family History:  Family History  Problem Relation Age of Onset  . Cancer Mother     bladder  . Emphysema Father   . Emphysema Sister   . COPD Sister   . Cancer Brother     prostate   Family Psychiatric  History: None reported. Social History:  History  Alcohol Use No     History  Drug Use No    Social History   Social History  . Marital Status: Widowed    Spouse Name: N/A  . Number of Children: N/A  . Years of Education: N/A   Social History Main Topics  . Smoking status: Never Smoker   . Smokeless tobacco: Never Used  . Alcohol Use: No  . Drug Use: No  . Sexual Activity: Not Currently   Other Topics Concern  . None   Social History Narrative   Additional Social History:    History of alcohol / drug use?: No history of alcohol / drug  abuse                    Sleep: Fair  Appetite:  Fair  Current Medications: Current Facility-Administered Medications  Medication Dose Route Frequency Provider Last Rate Last Dose  . acetaminophen (TYLENOL) tablet 650 mg  650 mg Oral Q6H PRN [name], MD      . alum & mag hydroxide-simeth (MAALOX/MYLANTA) 200-200-20 MG/5ML suspension 30 mL  30 mL Oral Q4H PRN [name], MD      . feeding supplement (ENSURE ENLIVE) (ENSURE ENLIVE) liquid 237 mL  237 mL Oral TID BM Jolanta B Pucilowska, MD   237 mL at 01/25/16 1304  . fluvoxaMINE (LUVOX) tablet 100 mg  100 mg Oral QHS Jolanta B Pucilowska, MD   100 mg at 01/24/16 2244  . magnesium hydroxide (MILK OF MAGNESIA) suspension 30 mL  30 mL Oral Daily PRN [name], MD   30 mL at 01/07/16 1458  . megestrol (MEGACE) 400 MG/10ML suspension 400 mg  400 mg Oral BID Shari Prows, MD   400 mg at 01/25/16 1302  . OLANZapine zydis (ZYPREXA) disintegrating tablet 5 mg  5 mg Oral QHS Darliss Ridgel, MD   5 mg at 01/24/16 2244  . tamsulosin (FLOMAX) capsule 0.4 mg  0.4 mg Oral QPC supper [name], MD   0.4 mg at  01/25/16 1832  . traZODone (DESYREL) tablet 50 mg  50 mg Oral QHS PRN [name], MD        Lab Results: No results found for this or any previous visit (from the past 48 hour(s)).  Blood Alcohol level:  Lab Results  Component Value Date   ETH <5 01/04/2016   ETH <5 12/15/2015    Physical Findings: AIMS: Facial and Oral Movements Muscles of Facial Expression: None, normal Lips and Perioral Area: None, normal Jaw: None, normal Tongue: None, normal,Extremity Movements Upper (arms, wrists, hands, fingers): None, normal Lower (legs, knees, ankles, toes): None, normal, Trunk Movements Neck, shoulders, hips: None, normal, Overall Severity Severity of abnormal movements (highest score from questions above): None, normal Incapacitation due to abnormal movements: None, normal Patient's awareness of abnormal  movements (rate only patient's report): No Awareness, Dental Status Current problems with teeth and/or dentures?: Yes Does patient usually wear dentures?: No  CIWA:    COWS:     Musculoskeletal: Strength & Muscle Tone: within normal limits Gait & Station: normal Patient leans: N/A  Psychiatric Specialty Exam: Review of Systems  Psychiatric/Behavioral: Positive for depression. The patient is nervous/anxious.   All other systems reviewed and are negative.   Blood pressure 121/85, pulse 105, temperature 97.6 F (36.4 C), temperature source Oral, resp. rate 20, height  (1.753 m), weight 73.936 kg (163 lb), SpO2 94 %.Body mass index is 24.06 kg/(m^2).  General Appearance: Casual  Eye Contact::  Minimal  Speech:  Clear and Coherent  Volume:  Normal  Mood:  Depressed  Affect:  Blunt  Thought Process:  Goal Directed  Orientation:  Full (Time, Place, and Person)  Thought Content:  WDL  Suicidal Thoughts:  No  Homicidal Thoughts:  No  Memory:  Immediate;   Fair Recent;   Fair Remote;   Fair  Judgement:  Poor  Insight:  Shallow  Psychomotor Activity:  Psychomotor Retardation  Concentration:  Fair  Recall:  Fair  Fund of Knowledge:Fair  Language: Fair  Akathisia:  No  Handed:  Right  AIMS (if indicated):     Assets:  Communication Skills Desire for Improvement Financial Resources/Insurance Housing Physical Health Resilience Social Support  ADL's:  Intact  Cognition: WNL  Sleep:  Number of Hours: 7.5   Treatment Plan Summary: Daily contact with patient to assess and evaluate symptoms and progress in treatment and Medication management   Dillon [name] is a 65 year old male with a history of anxiety and depression admitted for worsening of depression, malaise and severe weight loss in the context of recent major loss.  1. Suicidal ideation. The patient is passively suicidal unable to function. He is on 15 min checks.    2. Mood/anxietyand psychosis.Continue Luvox for  depression and OCD and Zyprexa for psychosis. This was lowered to 5 mg yesterday due to hypertonia.  3. Insomnia. He responded well to trazodone.   4. BPH. We continued Flomax.  5. Failure to thrive. We offered Megace and Ensure. Medicine input is greatly appreciated. Abdomen CT scan was unremarkable.  6. Cognitive decline. Psychological testing indicates unspecified neurocognitive disorder. Please see psychological consultation.  7. Metabolic syndrome monitoring. Lipid profile, TSH, and Hemoglobin A1c are normal. Prolactin 29.5.  8. R/O Vitamin B12 deficiency. Folic acid and vitamin B12 are normal.  9. Deconditioning. PT help is appreciated.   10. ECT.continue 3 times a week ECT next treatment scheduled for Wednesday. Reviewed labs and EKG. No contraindication to continuing. We'll monitor daily. No change to  his medicine for now for the depression which seems to be potentially helping. Patient's blood pressures staying on the low side but he is mostly staying in a wheelchair.   11. Fall. Head CT scan and left knee x-ray were negative.   12. Disposition. The patient will be discharged to home with family. He will follow-up with [facility].  I'm going to add magnesium citrate orally and then the Colace regularly to help with his constipation. Next ECT will be on Wednesday. Patient encouraged to continue eating and getting out of bed.  Dillon RasmussenJohn Alitza Cowman, MD 01/25/2016, 7:16 PM

## 2016-01-25 NOTE — Progress Notes (Signed)
D:  Per pt self inventory pt reports sleeping fair, appetite good, ability to pay attention poor, rates depression at a 3 out of 10, hopelessness at a 3 out of 10, anxiety at a 3 out of 10, denies SI/HI/AVH, goal today: "focus, eat, meditate", flat during interaction, pt states that he is felling better, and appetite is much improved, pt has not been as isolative as last week, had ECT today and said it went well.    A:  Emotional support provided, Encouraged pt to continue with treatment plan and attend all group activities, q15 min checks maintained for safety.  R:  Pt is receptive, going to some groups, pleasant and cooperative with staff and other patients on the unit.

## 2016-01-25 NOTE — Progress Notes (Signed)
Recreation Therapy Notes  Date: 04.10.17 Time: 1:00 pm Location: Craft Room  Group Topic: Wellness  Goal Area(s) Addresses:  Patient will identify at least one item per dimension of health. Patient will examine areas they are deficient in.  Behavioral Response: Did not attend  Intervention: 6 Dimensions of Health  Activity: Patients were given a definition sheet with the 6 Dimensions of Health on it. Patients were given a worksheet with the 6 Dimensions of Health on it and were encouraged to think of 2-3 things they are currently doing in each dimension.  Education: LRT educated patients on ways they can increase each dimension.  Education Outcome: Patient did not attend group.  Clinical Observations/Feedback: Patient did not attend group.  Jacquelynn CreeGreene,Nial Hawe M, LRT/CTRS 01/25/2016 2:16 PM

## 2016-01-25 NOTE — Procedures (Signed)
ECT SERVICES Physician's Interval Evaluation & Treatment Note  Patient Identification: Dillon [name] MRN:  [id number] Date of Evaluation:  01/25/2016 TX #: 3  MADRS:   MMSE:   P.E. Findings:  No change to basic physical exam vital signs stable.  Psychiatric Interval Note:  Mood is self-reported as better still appears flat  Subjective:  Patient is a 65 y.o. male seen for evaluation for Electroconvulsive Therapy. No new complaint  Treatment Summary:   [x]   Right Unilateral             []  Bilateral   % Energy : 0.3 ms 60%   Impedance: 1050 ohms  Seizure Energy Index: 4674 V squared  Postictal Suppression Index: 79%  Seizure Concordance Index: 98%  Medications  Pre Shock: Brevital 70 mg, succinylcholine 80 mg  Post Shock:    Seizure Duration: 31 seconds by EMG, 46 seconds by EEG   Comments: Follow-up in Wednesday this week   Lungs:  [x]   Clear to auscultation               []  Other:   Heart:    [x]   Regular rhythm             []  irregular rhythm    [x]   Previous H&P reviewed, patient examined and there are NO CHANGES                 []   Previous H&P reviewed, patient examined and there are changes noted.   Dillon RasmussenJohn Clapacs, MD 4/10/201711:47 AM

## 2016-01-25 NOTE — Progress Notes (Signed)
Heart Rate 160 On arrival to PACU Dr. Randa NgoPiscitello made aware.  Esmolol 40mg  IV given by Dr. Demetrius CharityP  HR down to 90's .

## 2016-01-25 NOTE — H&P (Signed)
Dillon [name] is an 65 y.o. male.   Chief Complaint: Patient has reported that he feels his mood is better. No physical complaints. HPI: Affect still looks a little blunt. Patient was severe depression recurrent also OCD but recent suicidal ideation being treated with ECT for depression  Past Medical History  Diagnosis Date  . Meningitis     spinal  . OCD (obsessive compulsive disorder)   . Kidney stones     Past Surgical History  Procedure Laterality Date  . Kidney stone surgery      Family History  Problem Relation Age of Onset  . Cancer Mother     bladder  . Emphysema Father   . Emphysema Sister   . COPD Sister   . Cancer Brother     prostate   Social History:  reports that he has never smoked. He has never used smokeless tobacco. He reports that he does not drink alcohol or use illicit drugs.  Allergies:  Allergies  Allergen Reactions  . Codeine Other (See Comments)    Unsure of reaction    Medications Prior to Admission  Medication Sig Dispense Refill  . fluvoxaMINE (LUVOX) 100 MG tablet Take 200 mg by mouth at bedtime.  0  . tamsulosin (FLOMAX) 0.4 MG CAPS capsule Take 1 capsule (0.4 mg total) by mouth 2 (two) times daily. 30 capsule 0  . traZODone (DESYREL) 50 MG tablet Take 0.5-1 tablets by mouth at bedtime as needed.  1    No results found for this or any previous visit (from the past 48 hour(s)). No results found.  Review of Systems  Constitutional: Negative.   HENT: Negative.   Eyes: Negative.   Respiratory: Negative.   Cardiovascular: Negative.   Gastrointestinal: Negative.   Musculoskeletal: Negative.   Skin: Negative.   Neurological: Negative.   Psychiatric/Behavioral: Negative for depression, suicidal ideas, hallucinations, memory loss and substance abuse. The patient is not nervous/anxious and does not have insomnia.     Blood pressure 127/91, pulse 55, temperature 97.5 F (36.4 C), temperature source Oral, resp. rate 16, height 5\' 9"   (1.753 m), weight 73.936 kg (163 lb), SpO2 100 %. Physical Exam  Nursing note and vitals reviewed. Constitutional: He appears well-developed and well-nourished.  HENT:  Head: Normocephalic and atraumatic.  Eyes: Conjunctivae are normal. Pupils are equal, round, and reactive to light.  Neck: Normal range of motion.  Cardiovascular: Normal rate, regular rhythm and normal heart sounds.   Respiratory: Effort normal and breath sounds normal. No respiratory distress.  GI: Soft.  Musculoskeletal: Normal range of motion.  Neurological: He is alert.  Skin: Skin is warm and dry.  Psychiatric: He has a normal mood and affect. His behavior is normal. Judgment and thought content normal.     Assessment/Plan Continue ECT treatment. Today in Wednesday. Continue regular monitoring of improvement in mood and behavior  Dillon RasmussenJohn Danella Philson, MD 01/25/2016, 11:45 AM

## 2016-01-25 NOTE — Transfer of Care (Signed)
Immediate Anesthesia Transfer of Care Note  Patient: Dillon [name]  Procedure(s) Performed: * No procedures listed *  Patient Location: PACU  Anesthesia Type:General  Level of Consciousness: sedated  Airway & Oxygen Therapy: Patient Spontanous Breathing and Patient connected to face mask oxygen  Post-op Assessment: Report given to RN and Post -op Vital signs reviewed and stable  Post vital signs: Reviewed and stable  Last Vitals:  Filed Vitals:   01/25/16 1206 01/25/16 1213  BP:  123/98  Pulse:  91  Temp: 36.2 C 36.2 C  Resp:  23    Complications: No apparent anesthesia complications

## 2016-01-25 NOTE — [facility] Group Notes (Signed)
[facility] Group Notes:  (Nursing/MHT/Case Management/Adjunct)  Date:  01/25/2016  Time:  2:57 AM  Type of Therapy:  Group Therapy  Participation Level:  Did Not Attend    Summary of Progress/Problems:  Dillon [name] 01/25/2016, 2:57 AM

## 2016-01-25 NOTE — Anesthesia Postprocedure Evaluation (Signed)
Anesthesia Post Note  Patient: Dillon PriestRobert H [name]  Procedure(s) Performed: * No procedures listed *  Patient location during evaluation: PACU Anesthesia Type: General Level of consciousness: awake and alert Pain management: pain level controlled Vital Signs Assessment: post-procedure vital signs reviewed and stable Respiratory status: spontaneous breathing, nonlabored ventilation, respiratory function stable and patient connected to nasal cannula oxygen Cardiovascular status: blood pressure returned to baseline and stable Postop Assessment: no signs of nausea or vomiting Anesthetic complications: no    Last Vitals:  Filed Vitals:   01/25/16 1231 01/25/16 1317  BP:  121/85  Pulse: 89 105  Temp: 36.6 C 36.4 C  Resp: 19 20    Last Pain:  Filed Vitals:   01/25/16 1318  PainSc: 0-No pain                 Cleda MccreedyJoseph K Aashir Umholtz

## 2016-01-25 NOTE — Plan of Care (Signed)
Problem: Alteration in mood Goal: LTG-Patient reports reduction in suicidal thoughts (Patient reports reduction in suicidal thoughts and is able to verbalize a safety plan for whenever patient is feeling suicidal)  Outcome: Progressing Pt denies SI     

## 2016-01-25 NOTE — Anesthesia Preprocedure Evaluation (Signed)
Anesthesia Evaluation  Patient identified by MRN, date of birth, ID band Patient awake    Reviewed: Allergy & Precautions, H&P , NPO status , Patient's Chart, lab work & pertinent test results, reviewed documented beta blocker date and time   History of Anesthesia Complications (+) PROLONGED EMERGENCE and history of anesthetic complications  Airway Mallampati: III  TM Distance: >3 FB Neck ROM: full  Mouth opening: Limited Mouth Opening  Dental no notable dental hx. (+) Poor Dentition, Missing, Chipped   Pulmonary neg pulmonary ROS,    Pulmonary exam normal breath sounds clear to auscultation       Cardiovascular Exercise Tolerance: Good negative cardio ROS Normal cardiovascular exam Rhythm:regular Rate:Normal     Neuro/Psych PSYCHIATRIC DISORDERS (Depression and OCD) Depression negative neurological ROS     GI/Hepatic Neg liver ROS, GERD  ,  Endo/Other  negative endocrine ROS  Renal/GU Renal disease (kidney stones)  negative genitourinary   Musculoskeletal   Abdominal   Peds  Hematology negative hematology ROS (+)   Anesthesia Other Findings Past Medical History:   Meningitis                                                     Comment:spinal   OCD (obsessive compulsive disorder)                          Kidney stones                                               Past Surgical History:   KIDNEY STONE SURGERY                                         BMI    Body Mass Index   24.05 kg/m 2      Reproductive/Obstetrics negative OB ROS                             Anesthesia Physical  Anesthesia Plan  ASA: III  Anesthesia Plan: General   Post-op Pain Management:    Induction:   Airway Management Planned:   Additional Equipment:   Intra-op Plan:   Post-operative Plan:   Informed Consent: I have reviewed the patients History and Physical, chart, labs and discussed the  procedure including the risks, benefits and alternatives for the proposed anesthesia with the patient or authorized representative who has indicated his/her understanding and acceptance.   Dental Advisory Given  Plan Discussed with: Anesthesiologist, CRNA and Surgeon  Anesthesia Plan Comments:         Anesthesia Quick Evaluation

## 2016-01-26 LAB — GLUCOSE, CAPILLARY: GLUCOSE-CAPILLARY: 144 mg/dL — AB (ref 65–99)

## 2016-01-26 NOTE — Progress Notes (Signed)
D: Pt remains isolative in his room this evening except for snacks and medication administration. Affect is flat but he does brighten upon approach. He denies SI/HI/AVH at this time. Denies pain. Pt rates his depression as a 3 out of 10. He talks with Clinical research associatewriter about the grieving process, stating "as time goes on you think about them less", referring to his wife. Pt refuses ordered magnesium citrate, reporting that he had a BM this afternoon. A: Emotional support and encouragement provided. Medications administered with education. q15 minute safety checks maintained. R: Pt remains free from harm. Will continue to monitor.

## 2016-01-26 NOTE — Progress Notes (Signed)
D: Patient stated slept good last night .Stated appetite is good and energy level  Is normal. Stated concentration is good . Stated Depression Denies suicidal  homicidal ideations  .  No auditory hallucinations  No pain concerns . Appropriate ADL'S. Interacting with peers and staff.  Attending unit programming .Aware of ECT treatment in am A: Encourage patient participation with unit programming . Instruction  Given on  Medication , verbalize understanding. R: Voice no other concerns. Staff continue to monitor

## 2016-01-26 NOTE — Plan of Care (Signed)
Problem: Alteration in mood Goal: LTG-Patient reports reduction in suicidal thoughts (Patient reports reduction in suicidal thoughts and is able to verbalize a safety plan for whenever patient is feeling suicidal)  Outcome: Progressing Denies  Suicidal ideations      

## 2016-01-26 NOTE — Plan of Care (Signed)
Problem: Alteration in mood Goal: LTG-Patient reports reduction in suicidal thoughts (Patient reports reduction in suicidal thoughts and is able to verbalize a safety plan for whenever patient is feeling suicidal)  Outcome: Progressing Pt denies SI at this time. Goal: STG-Patient is able to discuss feelings and issues (Patient is able to discuss feelings and issues leading to depression)  Outcome: Progressing Pt discusses his thoughts and feelings about the grieving process with Clinical research associatewriter.

## 2016-01-26 NOTE — Progress Notes (Signed)
[facility] MD Progress Note  01/26/2016 6:23 PM Dillon [name]  MRN:  [id number]  Subjective: Update as of Tuesday the 11th. Patient reports that he is continuing to feel better every day. He demonstrated for me that he could stand up unassisted. He is eating well. Patient actually seem more alert awake and interactive today. Has been going to groups and interacting with peers. Denies any suicidal ideation. Tolerating ECT well and also tolerating medicine. His constipation appears to have resolved after intervention yesterday. Principal Problem: Severe recurrent major depression without psychotic features (HCC) Diagnosis:   Patient Active Problem List   Diagnosis Date Noted  . Suicidal ideation [R45.851] 01/04/2016  . Severe recurrent major depression without psychotic features (HCC) [F33.2] 12/15/2015  . Grief [F43.21] 12/15/2015  . Weight loss [R63.4] 12/15/2015  . OCD (obsessive compulsive disorder) [F42.9] 12/15/2015  . Benign prostatic hyperplasia with urinary obstruction [N40.1] 11/24/2015  . 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  Past Medical History:  Past Medical History  Diagnosis Date  . Meningitis     spinal  . OCD (obsessive compulsive disorder)   . Kidney stones     Past Surgical History  Procedure Laterality Date  . Kidney stone surgery     Family History:  Family History  Problem Relation Age of Onset  . Cancer Mother     bladder  . Emphysema Father   . Emphysema Sister   . COPD Sister   . Cancer Brother     prostate   Family Psychiatric  History: None reported. Social History:  History  Alcohol Use No     History  Drug Use No    Social History   Social History  . Marital Status: Widowed    Spouse Name: N/A  . Number of Children: N/A  . Years of Education: N/A   Social History Main Topics  . Smoking status: Never Smoker   .  Smokeless tobacco: Never Used  . Alcohol Use: No  . Drug Use: No  . Sexual Activity: Not Currently   Other Topics Concern  . None   Social History Narrative   Additional Social History:    History of alcohol / drug use?: No history of alcohol / drug abuse                    Sleep: Fair  Appetite:  Fair  Current Medications: Current Facility-Administered Medications  Medication Dose Route Frequency Provider Last Rate Last Dose  . acetaminophen (TYLENOL) tablet 650 mg  650 mg Oral Q6H PRN [name], MD      . alum & mag hydroxide-simeth (MAALOX/MYLANTA) 200-200-20 MG/5ML suspension 30 mL  30 mL Oral Q4H PRN [name], MD      . docusate sodium (COLACE) capsule 200 mg  200 mg Oral BID [name], MD   200 mg at 01/26/16 0854  . feeding supplement (ENSURE ENLIVE) (ENSURE ENLIVE) liquid 237 mL  237 mL Oral TID BM Jolanta B Pucilowska, MD   237 mL at 01/26/16 1416  . fluvoxaMINE (LUVOX) tablet 100 mg  100 mg Oral QHS Shari Prows, MD   100 mg at 01/25/16 2121  . magnesium citrate solution 1 Bottle  1 Bottle Oral Once [name], MD   1 Bottle at 01/25/16 2043  . magnesium hydroxide (MILK OF MAGNESIA) suspension 30 mL  30 mL Oral  Daily PRN Audery AmelJohn T Marny Smethers, MD   30 mL at 01/07/16 1458  . megestrol (MEGACE) 400 MG/10ML suspension 400 mg  400 mg Oral BID Shari ProwsJolanta B Pucilowska, MD   400 mg at 01/26/16 0854  . OLANZapine zydis (ZYPREXA) disintegrating tablet 5 mg  5 mg Oral QHS Darliss RidgelAarti K Kapur, MD   5 mg at 01/25/16 2121  . tamsulosin (FLOMAX) capsule 0.4 mg  0.4 mg Oral QPC supper Audery AmelJohn T Arlyce Circle, MD   0.4 mg at 01/26/16 1811  . traZODone (DESYREL) tablet 50 mg  50 mg Oral QHS PRN Audery AmelJohn T Terrisha Lopata, MD        Lab Results:  Results for orders placed or performed during the hospital encounter of 01/04/16 (from the past 48 hour(s))  Glucose, capillary     Status: Abnormal   Collection Time: 01/26/16  6:46 AM  Result Value Ref Range   Glucose-Capillary 144 (H) 65 -  99 mg/dL    Blood Alcohol level:  Lab Results  Component Value Date   ETH <5 01/04/2016   ETH <5 12/15/2015    Physical Findings: AIMS: Facial and Oral Movements Muscles of Facial Expression: None, normal Lips and Perioral Area: None, normal Jaw: None, normal Tongue: None, normal,Extremity Movements Upper (arms, wrists, hands, fingers): None, normal Lower (legs, knees, ankles, toes): None, normal, Trunk Movements Neck, shoulders, hips: None, normal, Overall Severity Severity of abnormal movements (highest score from questions above): None, normal Incapacitation due to abnormal movements: None, normal Patient's awareness of abnormal movements (rate only patient's report): No Awareness, Dental Status Current problems with teeth and/or dentures?: Yes Does patient usually wear dentures?: No  CIWA:    COWS:     Musculoskeletal: Strength & Muscle Tone: within normal limits Gait & Station: normal Patient leans: N/A  Psychiatric Specialty Exam: Review of Systems  Psychiatric/Behavioral: Negative for depression. The patient is nervous/anxious.   All other systems reviewed and are negative.   Blood pressure 106/72, pulse 72, temperature 98.3 F (36.8 C), temperature source Oral, resp. rate 18, height 5\' 9"  (1.753 m), weight 73.936 kg (163 lb), SpO2 94 %.Body mass index is 24.06 kg/(m^2).  General Appearance: Casual  Eye Contact::  Minimal  Speech:  Clear and Coherent  Volume:  Normal  Mood:  Depressed  Affect:  Blunt  Thought Process:  Goal Directed  Orientation:  Full (Time, Place, and Person)  Thought Content:  WDL  Suicidal Thoughts:  No  Homicidal Thoughts:  No  Memory:  Immediate;   Fair Recent;   Fair Remote;   Fair  Judgement:  Poor  Insight:  Shallow  Psychomotor Activity:  Psychomotor Retardation  Concentration:  Fair  Recall:  [employer]Fair  Fund of Knowledge:Fair  Language: Fair  Akathisia:  No  Handed:  Right  AIMS (if indicated):     Assets:  Communication  Skills Desire for Improvement Financial Resources/Insurance Housing Physical Health Resilience Social Support  ADL's:  Intact  Cognition: WNL  Sleep:  Number of Hours: 6   Treatment Plan Summary: Daily contact with patient to assess and evaluate symptoms and progress in treatment and Medication management   Dillon [name] is a 65 year old male with a history of anxiety and depression admitted for worsening of depression, malaise and severe weight loss in the context of recent major loss.  1. Suicidal ideation. The patient is passively suicidal unable to function. He is on 15 min checks.    2. Mood/anxietyand psychosis.Continue Luvox for depression and OCD and Zyprexa for psychosis.  This was lowered to 5 mg yesterday due to hypertonia.  3. Insomnia. He responded well to trazodone.   4. BPH. We continued Flomax.  5. Failure to thrive. We offered Megace and Ensure. Medicine input is greatly appreciated. Abdomen CT scan was unremarkable.  6. Cognitive decline. Psychological testing indicates unspecified neurocognitive disorder. Please see psychological consultation.  7. Metabolic syndrome monitoring. Lipid profile, TSH, and Hemoglobin A1c are normal. Prolactin 29.5.  8. R/O Vitamin B12 deficiency. Folic acid and vitamin B12 are normal.  9. Deconditioning. PT help is appreciated.   10. ECT.continue 3 times a week ECT next treatment scheduled for Wednesday. Reviewed labs and EKG. No contraindication to continuing. We'll monitor daily. No change to his medicine for now for the depression which seems to be potentially helping. Patient's blood pressures staying on the low side but he is mostly staying in a wheelchair.   11. Fall. Head CT scan and left knee x-ray were negative.   12. Disposition. The patient will be discharged to home with family. He will follow-up with [facility].  ECT tomorrow. No change to medicine today. Constipation appears to be under good control now with adding Colace.  Supportive counseling to the patient and encouragement. I talked with social work today and we are anticipating a possible discharge within a week and I would like to see if we can arrange for outpatient ECT before then. [name], MD 01/26/2016, 6:23 PM

## 2016-01-27 ENCOUNTER — Encounter: Payer: Self-pay | Admitting: *Deleted

## 2016-01-27 ENCOUNTER — Inpatient Hospital Stay: Payer: BLUE CROSS/BLUE SHIELD | Admitting: *Deleted

## 2016-01-27 ENCOUNTER — Other Ambulatory Visit: Payer: Self-pay | Admitting: Psychiatry

## 2016-01-27 ENCOUNTER — Inpatient Hospital Stay: Payer: BLUE CROSS/BLUE SHIELD

## 2016-01-27 LAB — GLUCOSE, CAPILLARY: Glucose-Capillary: 113 mg/dL — ABNORMAL HIGH (ref 65–99)

## 2016-01-27 MED ORDER — SUCCINYLCHOLINE CHLORIDE 20 MG/ML IJ SOLN
INTRAMUSCULAR | Status: DC | PRN
  Administered 2016-01-27: 80 mg via INTRAVENOUS

## 2016-01-27 MED ORDER — METHOHEXITAL SODIUM 100 MG/10ML IV SOSY
PREFILLED_SYRINGE | INTRAVENOUS | Status: DC | PRN
  Administered 2016-01-27: 70 mg via INTRAVENOUS

## 2016-01-27 MED ORDER — SODIUM CHLORIDE 0.9 % IV SOLN
250.0000 mL | Freq: Once | INTRAVENOUS | Status: AC
  Administered 2016-01-27: 500 mL via INTRAVENOUS

## 2016-01-27 MED ORDER — SODIUM CHLORIDE 0.9 % IV SOLN
INTRAVENOUS | Status: DC | PRN
  Administered 2016-01-27: 12:00:00 via INTRAVENOUS

## 2016-01-27 MED ORDER — LABETALOL HCL 5 MG/ML IV SOLN: 20.0000 mg | Freq: Once | INTRAVENOUS | Status: DC

## 2016-01-27 MED ORDER — METOPROLOL TARTRATE 1 MG/ML IV SOLN
INTRAVENOUS | Status: DC | PRN
  Administered 2016-01-27: 2.5 mg via INTRAVENOUS

## 2016-01-27 MED ORDER — ESMOLOL HCL 100 MG/10ML IV SOLN
INTRAVENOUS | Status: DC | PRN
  Administered 2016-01-27 (×2): 10 mg via INTRAVENOUS

## 2016-01-27 NOTE — [facility] Group Notes (Signed)
[facility] Group Notes:  (Nursing/MHT/Case Management/Adjunct)  Date:  01/27/2016  Time:  9:16 PM  Type of Therapy:  Group Therapy  Participation Level:  Active  Participation Quality:  Appropriate  Affect:  Appropriate  Cognitive:  Appropriate  Insight:  Appropriate  Engagement in Group:  Engaged  Modes of Intervention:  Discussion  Summary of Progress/Problems:  Ethel RanaSherrell M Memorie Yokoyama 01/27/2016, 9:16 PM

## 2016-01-27 NOTE — [facility] Group Notes (Signed)
[facility]  01/27/2016 3:04 PM  Type of Therapy:  Group Therapy  Participation Level:  Did Not Attend   Glennon MacLaws, Andree Heeg P, MSW, LCSW 01/27/2016, 3:04 PM

## 2016-01-27 NOTE — [facility] Group Notes (Signed)
[facility] Group Notes:  (Nursing/MHT/Case Management/Adjunct)  Date:  01/27/2016  Time:  3:47 PM  Type of Therapy:  Psychoeducational Skills  Participation Level:  Did Not Attend  Twanna Hymanda C Sonnie Pawloski 01/27/2016, 3:47 PM

## 2016-01-27 NOTE — H&P (Signed)
Dillon [name] is an 65 y.o. male.   Chief Complaint: No new complaint. Mood gradually improving. Denies suicidal thoughts. HPI: Recurrent severe depression. Partially responsive now receiving ECT.  Past Medical History  Diagnosis Date  . Meningitis     spinal  . OCD (obsessive compulsive disorder)   . Kidney stones     Past Surgical History  Procedure Laterality Date  . Kidney stone surgery      Family History  Problem Relation Age of Onset  . Cancer Mother     bladder  . Emphysema Father   . Emphysema Sister   . COPD Sister   . Cancer Brother     prostate   Social History:  reports that he has never smoked. He has never used smokeless tobacco. He reports that he does not drink alcohol or use illicit drugs.  Allergies:  Allergies  Allergen Reactions  . Codeine Other (See Comments)    Unsure of reaction    Medications Prior to Admission  Medication Sig Dispense Refill  . fluvoxaMINE (LUVOX) 100 MG tablet Take 200 mg by mouth at bedtime.  0  . tamsulosin (FLOMAX) 0.4 MG CAPS capsule Take 1 capsule (0.4 mg total) by mouth 2 (two) times daily. 30 capsule 0  . traZODone (DESYREL) 50 MG tablet Take 0.5-1 tablets by mouth at bedtime as needed.  1    Results for orders placed or performed during the hospital encounter of 01/04/16 (from the past 48 hour(s))  Glucose, capillary     Status: Abnormal   Collection Time: 01/26/16  6:46 AM  Result Value Ref Range   Glucose-Capillary 144 (H) 65 - 99 mg/dL  Glucose, capillary     Status: Abnormal   Collection Time: 01/27/16  6:37 AM  Result Value Ref Range   Glucose-Capillary 113 (H) 65 - 99 mg/dL   No results found.  Review of Systems  Constitutional: Negative.   HENT: Negative.   Eyes: Negative.   Respiratory: Negative.   Cardiovascular: Negative.   Gastrointestinal: Negative.   Musculoskeletal: Negative.   Skin: Negative.   Neurological: Negative.   Psychiatric/Behavioral: Negative for depression, suicidal ideas,  hallucinations, memory loss and substance abuse. The patient is not nervous/anxious and does not have insomnia.     Blood pressure 131/78, pulse 51, temperature 96.5 F (35.8 C), temperature source Oral, resp. rate 18, height 5\' 9"  (1.753 m), weight 73.029 kg (161 lb), SpO2 96 %. Physical Exam  Nursing note and vitals reviewed. Constitutional: He appears well-developed and well-nourished.  HENT:  Head: Normocephalic and atraumatic.  Eyes: Conjunctivae are normal. Pupils are equal, round, and reactive to light.  Neck: Normal range of motion.  Cardiovascular: Normal rate, regular rhythm and normal heart sounds.   Respiratory: Effort normal and breath sounds normal. No respiratory distress.  GI: Soft.  Musculoskeletal: Normal range of motion.  Neurological: He is alert.  Skin: Skin is warm and dry.  Psychiatric: Judgment and thought content normal. His affect is blunt. His speech is delayed. He is slowed. Cognition and memory are normal.     Assessment/Plan Follow-up Monday. We are still considering possible discharge date. Possibility of outpatient treatment seems remote because of lack of transport.  Dillon RasmussenJohn Jamisen Duerson, MD 01/27/2016, 11:40 AM

## 2016-01-27 NOTE — Tx Team (Signed)
Interdisciplinary Treatment Plan Update (Adult)  Date:  01/26/2016 Time Reviewed:  4:28 PM  Progress in Treatment: Attending groups: Yes., intermittently Participating in groups:  Yes. intermittently Taking medication as prescribed:  Yes. Tolerating medication:  Yes. Family/Significant othe contact made:  Yes, individual(s) contacted:  Daughter [name] Patient understands diagnosis:  Yes. Discussing patient identified problems/goals with staff:  Yes. Medical problems stabilized or resolved:  Yes. Denies suicidal/homicidal ideation: Yes. Issues/concerns per patient self-inventory:  No. Other:  New problem(s) identified: No, Describe:    Discharge Plan or Barriers: Pt plans to return home and follow up with outpatient, likely maintenance ECT.   Reason for Continuation of Hospitalization: Depression Medication stabilization Suicidal ideation  Comments:  Pt is continuing to improve, continues ECT treatments, appetite improving, attending groups and more social.  Lighter brighter affect and mood.  Estimated length of stay: 7 days   New goal(s): NA  Review of initial/current patient goals per problem list:  1. Goal(s): Patient will participate in aftercare plan  Met:yes  Target date: at discharge  As evidenced by: Patient will participate within aftercare plan AEB aftercare provider and housing plan at discharge being identified. 01/19/16: has a place to go but needs follow up arranged by discharge  01/27/2016-Met, will likely follow up outpatient ECT 2. Goal (s): Patient will exhibit decreased depressive symptoms and suicidal ideations.  Met:No  Target date: at discharge  As evidenced by: Patient will utilize self rating of depression at 3 or below and demonstrate decreased signs of depression or be deemed stable for discharge by MD. 01/19/16: Patient remains SI and awaiting ECT consult  01/27/2016-continuing ECT treatments 3. Goal(s): Patient will demonstrate  decreased signs and symptoms of anxiety.  Met:No  Target date: at discharge  As evidenced by: Patient will utilize self rating of anxiety at 3 or below and demonstrated decreased signs of anxiety, or be deemed stable for discharge by MD 01/19/16: Patient will continue to be monitored for anxiety sypmptoms.  01/27/2016-Still assessing 4. Goal (s): Patient will demonstrate decreased symptoms of psychosis.  Met:No  Target date: at discharge  As evidenced by: Patient will not endorse signs of psychosis or be deemed stable for discharge by MD. 01/19/16: Patient continues to take meds and monitored for med adjustments.   01/27/2016-still assessing  Attendees: Patient:  Dillon [name] 4/12/20174:28 PM  Family:   4/12/20174:28 PM  Physician:  Dr. Bary Leriche 4/12/20174:28 PM  Nursing:   Polly Cobia 4/12/20174:28 PM  Case Manager:   4/12/20174:28 PM  Counselor:  Dossie Arbour, LCSW 4/12/20174:28 PM  Other:   4/12/20174:28 PM  Other:   4/12/20174:28 PM  Other:   4/12/20174:28 PM  Other:  4/12/20174:28 PM  Other:  4/12/20174:28 PM  Other:  4/12/20174:28 PM  Other:  4/12/20174:28 PM  Other:  4/12/20174:28 PM  Other:  4/12/20174:28 PM  Other:   4/12/20174:28 PM   Scribe for Treatment Team:   August Saucer, 01/27/2016, 4:28 PM, MSW, LCSW

## 2016-01-27 NOTE — Progress Notes (Signed)
D: Pt affect is brighter this evening. He reports that his goal today was "to focus and meditate some." Pt rates his depression as a 2 out of 10 and anxiety 2 out of 10. He denies SI/HI/AVH at this time. Denies pain. Pt is NPO after midnight d/t ECT tomorrow. A: Emotional support and encouragement provided. Medications administered with education. q15 minute safety checks maintained. R: Pt remains free from harm. Will continue to monitor.

## 2016-01-27 NOTE — Plan of Care (Signed)
Problem: Ineffective individual coping Goal: LTG: Patient will report a decrease in negative feelings Outcome: Progressing Pt affect is brighter. He reports a decrease in depression, which he now rates a 2 out of 10. Goal: STG:Pt. will utilize relaxation techniques to reduce stress STG: Patient will utilize relaxation techniques to reduce stress levels  Outcome: Progressing Pt states he is trying to work on meditating when he feels stressed.

## 2016-01-27 NOTE — Progress Notes (Signed)
D: Patient escorted to and from ECT without incidence . Alert on return . Affect cheerful ,  Noted to sing  And make jokes. Patient voice of his Grief with wife and spoke of anniversary date .   noted later in shift to walk short distance  To room next to his.  Patient stated slept fair last night .Stated appetite is good and energy level  lowl. Stated concentration is good . Stated on Depression scale 3, hopeless 3 and anxiety 3 .( low 0-10 high) Denies suicidal  homicidal ideations  .  No auditory hallucinations  No pain concerns . Appropriate ADL'S. Interacting with peers and staff. Voice of  Goal today  Focus and concentrate . A: Encourage patient participation with unit programming . Instruction  Given on  Medication , verbalize understanding. R: Voice no other concerns. Staff continue to monitor

## 2016-01-27 NOTE — [facility] Group Notes (Signed)
[facility] LCSW Group Therapy  01/27/2016 2:41 PM  Type of Therapy:  Group Therapy  Participation Level:  Did Not Attend Likely at ECT during group  Isaic Syler, Cleda DaubSara P, MSW, LCSW 01/27/2016, 2:41 PM

## 2016-01-27 NOTE — Anesthesia Procedure Notes (Signed)
Date/Time: 01/27/2016 11:41 AM Performed by: Junious SilkNOLES, Deondria Puryear Pre-anesthesia Checklist: Patient identified, Emergency Drugs available, Timeout performed, Suction available and Patient being monitored Oxygen Delivery Method: Ambu bag Preoxygenation: Pre-oxygenation with 100% oxygen

## 2016-01-27 NOTE — Anesthesia Postprocedure Evaluation (Signed)
Anesthesia Post Note  Patient: Dillon [name]  Procedure(s) Performed: * No procedures listed *  Patient location during evaluation: PACU Anesthesia Type: General Level of consciousness: awake Pain management: pain level controlled Vital Signs Assessment: post-procedure vital signs reviewed and stable Respiratory status: spontaneous breathing Cardiovascular status: blood pressure returned to baseline Postop Assessment: no headache Anesthetic complications: no    Last Vitals:  Filed Vitals:   01/27/16 1205 01/27/16 1215  BP: 112/92 121/90  Pulse: 73 72  Temp: 36.9 C   Resp: 14 18    Last Pain:  Filed Vitals:   01/27/16 1223  PainSc: 0-No pain                 Chaunta Bejarano M

## 2016-01-27 NOTE — Transfer of Care (Signed)
Immediate Anesthesia Transfer of Care Note  Patient: Dillon [name]  Procedure(s) Performed: ECT  Patient Location: PACU  Anesthesia Type:General  Level of Consciousness: awake, alert , oriented and sedated  Airway & Oxygen Therapy: Patient Spontanous Breathing and Patient connected to face mask oxygen  Post-op Assessment: Report given to RN and Post -op Vital signs reviewed and stable  Post vital signs: Reviewed and stable  Last Vitals:  Filed Vitals:   01/27/16 0904 01/27/16 1205  BP: 131/78 112/92  Pulse: 51 73  Temp: 35.8 C 36.9 C  Resp: 18 14    Complications: No apparent anesthesia complications

## 2016-01-27 NOTE — Anesthesia Preprocedure Evaluation (Signed)
Anesthesia Evaluation  Patient identified by MRN, date of birth, ID band Patient awake    Reviewed: Allergy & Precautions, NPO status , Patient's Chart, lab work & pertinent test results  Airway Mallampati: I  TM Distance: >3 FB Neck ROM: Full    Dental  (+) Teeth Intact   Pulmonary    Pulmonary exam normal        Cardiovascular Exercise Tolerance: Poor hypertension, Pt. on home beta blockers and Pt. on medications Normal cardiovascular exam     Neuro/Psych Depression    GI/Hepatic   Endo/Other    Renal/GU Hx of stones.     Musculoskeletal   Abdominal Normal abdominal exam  (+)   Peds  Hematology   Anesthesia Other Findings   Reproductive/Obstetrics                             Anesthesia Physical Anesthesia Plan  ASA: III  Anesthesia Plan: General   Post-op Pain Management:    Induction: Intravenous  Airway Management Planned: Mask  Additional Equipment:   Intra-op Plan:   Post-operative Plan:   Informed Consent: I have reviewed the patients History and Physical, chart, labs and discussed the procedure including the risks, benefits and alternatives for the proposed anesthesia with the patient or authorized representative who has indicated his/her understanding and acceptance.     Plan Discussed with: CRNA  Anesthesia Plan Comments:         Anesthesia Quick Evaluation

## 2016-01-27 NOTE — Plan of Care (Signed)
Problem: Ineffective individual coping Goal: STG: Pt will be able to identify effective and ineffective STG: Pt will be able to identify effective and ineffective coping patterns  Outcome: Progressing A;ffct cheerful able to apply self in productive maner

## 2016-01-27 NOTE — Procedures (Addendum)
ECT SERVICES Physician's Interval Evaluation & Treatment Note  Patient Identification: Dillon [name] MRN:  [id number] Date of Evaluation:  01/27/2016 TX #: [id number]  MADRS: 20  MMSE: 30  P.E. Findings:  Physical exam stable vital signs stable lungs clear heart normal eating well. Improving.  Psychiatric Interval Note:  Blunted but not subjectively depressed not suicidal  Subjective:  Patient is a 65 y.o. male seen for evaluation for Electroconvulsive Therapy. Still a little bit down and flat and negative  Treatment Summary:   [x]   Right Unilateral             []  Bilateral   % Energy : 0.3 ms 60%   Impedance: 1230 ohms  Seizure Energy Index: 5479 V squared  Postictal Suppression Index: 59%  Seizure Concordance Index: 97%  Medications  Pre Shock: Brevital 70 mg succinylcholine 80 mg  Post Shock:  Esmolol 10 mg twice  Seizure Duration: EMG 22 seconds, EEG 35 seconds   Comments: Follow-up treatment Monday . Patient went up to a tachycardia in the range of 150 and so was treated with esmolol 10 mg twice and now has returned down toward a more normal pulse. Blood pressure up a little bit but calming down. Otherwise appears to be stable.  Lungs:  [x]   Clear to auscultation               []  Other:   Heart:    [x]   Regular rhythm             []  irregular rhythm    [x]   Previous H&P reviewed, patient examined and there are NO CHANGES                 []   Previous H&P reviewed, patient examined and there are changes noted.   Dillon RasmussenJohn Clapacs, MD 4/12/201711:41 AM

## 2016-01-27 NOTE — Progress Notes (Signed)
[facility] MD Progress Note  01/27/2016 8:00 PM [name]  MRN:  [id number]  Subjective: Up-to-date Wednesday the 12th. Patient had ECT this morning and tolerated it well. This evening his mood is continuing to be improved. Affect brighter. Eating well. Unfortunately it sounds like he may not be able to manage outpatient ECT arrangements Principal Problem: Severe recurrent major depression without psychotic features [facility]) Diagnosis:   Patient Active Problem List   Diagnosis Date Noted  . Suicidal ideation [R45.851] 01/04/2016  . Severe recurrent major depression without psychotic features (HCC) [F33.2] 12/15/2015  . Grief [F43.21] 12/15/2015  . Weight loss [R63.4] 12/15/2015  . OCD (obsessive compulsive disorder) [F42.9] 12/15/2015  . Benign prostatic hyperplasia with urinary obstruction [N40.1] 11/24/2015  . 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  Past Medical History:  Past Medical History  Diagnosis Date  . Meningitis     spinal  . OCD (obsessive compulsive disorder)   . Kidney stones     Past Surgical History  Procedure Laterality Date  . Kidney stone surgery     Family History:  Family History  Problem Relation Age of Onset  . Cancer Mother     bladder  . Emphysema Father   . Emphysema Sister   . COPD Sister   . Cancer Brother     prostate   Family Psychiatric  History: None reported. Social History:  History  Alcohol Use No     History  Drug Use No    Social History   Social History  . Marital Status: Widowed    Spouse Name: N/A  . Number of Children: N/A  . Years of Education: N/A   Social History Main Topics  . Smoking status: Never Smoker   . Smokeless tobacco: Never Used  . Alcohol Use: No  . Drug Use: No  . Sexual Activity: Not Currently   Other Topics Concern  . None   Social History Narrative   Additional Social History:     History of alcohol / drug use?: No history of alcohol / drug abuse                    Sleep: Fair  Appetite:  Fair  Current Medications: Current Facility-Administered Medications  Medication Dose Route Frequency Provider Last Rate Last Dose  . acetaminophen (TYLENOL) tablet 650 mg  650 mg Oral Q6H PRN [name], MD      . alum & mag hydroxide-simeth (MAALOX/MYLANTA) 200-200-20 MG/5ML suspension 30 mL  30 mL Oral Q4H PRN [name], MD      . docusate sodium (COLACE) capsule 200 mg  200 mg Oral BID [name], MD   200 mg at 01/27/16 1426  . feeding supplement (ENSURE ENLIVE) (ENSURE ENLIVE) liquid 237 mL  237 mL Oral TID BM Jolanta B Pucilowska, MD   237 mL at 01/27/16 1431  . fluvoxaMINE (LUVOX) tablet 100 mg  100 mg Oral QHS Jolanta B Pucilowska, MD   100 mg at 01/26/16 2116  . labetalol (NORMODYNE,TRANDATE) injection 20 mg  20 mg Intravenous Once [name], MD      . magnesium citrate solution 1 Bottle  1 Bottle Oral Once [name], MD   1 Bottle at 01/25/16 2043  . magnesium hydroxide (MILK OF MAGNESIA) suspension 30 mL  30 mL Oral Daily PRN [name], MD   30  mL at 01/07/16 1458  . megestrol (MEGACE) 400 MG/10ML suspension 400 mg  400 mg Oral BID Jolanta B Pucilowska, MD   400 mg at 01/27/16 1426  . OLANZapine zydis (ZYPREXA) disintegrating tablet 5 mg  5 mg Oral QHS Darliss Ridgel, MD   5 mg at 01/26/16 2116  . tamsulosin (FLOMAX) capsule 0.4 mg  0.4 mg Oral QPC supper [name], MD   0.4 mg at 01/27/16 1754  . traZODone (DESYREL) tablet 50 mg  50 mg Oral QHS PRN [name], MD        Lab Results:  Results for orders placed or performed during the hospital encounter of 01/04/16 (from the past 48 hour(s))  Glucose, capillary     Status: Abnormal   Collection Time: 01/26/16  6:46 AM  Result Value Ref Range   Glucose-Capillary 144 (H) 65 - 99 mg/dL  Glucose, capillary     Status: Abnormal   Collection Time: 01/27/16  6:37 AM  Result  Value Ref Range   Glucose-Capillary 113 (H) 65 - 99 mg/dL    Blood Alcohol level:  Lab Results  Component Value Date   ETH <5 01/04/2016   ETH <5 12/15/2015    Physical Findings: AIMS: Facial and Oral Movements Muscles of Facial Expression: None, normal Lips and Perioral Area: None, normal Jaw: None, normal Tongue: None, normal,Extremity Movements Upper (arms, wrists, hands, fingers): None, normal Lower (legs, knees, ankles, toes): None, normal, Trunk Movements Neck, shoulders, hips: None, normal, Overall Severity Severity of abnormal movements (highest score from questions above): None, normal Incapacitation due to abnormal movements: None, normal Patient's awareness of abnormal movements (rate only patient's report): No Awareness, Dental Status Current problems with teeth and/or dentures?: Yes Does patient usually wear dentures?: No  CIWA:    COWS:     Musculoskeletal: Strength & Muscle Tone: within normal limits Gait & Station: normal Patient leans: N/A  Psychiatric Specialty Exam: Review of Systems  Psychiatric/Behavioral: Negative for depression. The patient is nervous/anxious.   All other systems reviewed and are negative.   Blood pressure 114/73, pulse 98, temperature 97.8 F (36.6 C), temperature source Oral, resp. rate 18, height  (1.753 m), weight 73.029 kg (161 lb), SpO2 95 %.Body mass index is 23.76 kg/(m^2).  General Appearance: Casual  Eye Contact::  Minimal  Speech:  Clear and Coherent  Volume:  Normal  Mood:  Depressed  Affect:  Blunt  Thought Process:  Goal Directed  Orientation:  Full (Time, Place, and Person)  Thought Content:  WDL  Suicidal Thoughts:  No  Homicidal Thoughts:  No  Memory:  Immediate;   Fair Recent;   Fair Remote;   Fair  Judgement:  Poor  Insight:  Shallow  Psychomotor Activity:  Psychomotor Retardation  Concentration:  Fair  Recall:  Fair  Fund of Knowledge:Fair  Language: Fair  Akathisia:  No  Handed:  Right   AIMS (if indicated):     Assets:  Communication Skills Desire for Improvement Financial Resources/Insurance Housing Physical Health Resilience Social Support  ADL's:  Intact  Cognition: WNL  Sleep:  Number of Hours: 7.5   Treatment Plan Summary: Daily contact with patient to assess and evaluate symptoms and progress in treatment and Medication management   Mr. Tucholski is a 65 year old male with a history of anxiety and depression admitted for worsening of depression, malaise and severe weight loss in the context of recent major loss.  1. Suicidal ideation. The patient is passively suicidal unable to  function. He is on 15 min checks.    2. Mood/anxietyand psychosis.Continue Luvox for depression and OCD and Zyprexa for psychosis. This was lowered to 5 mg yesterday due to hypertonia.  3. Insomnia. He responded well to trazodone.   4. BPH. We continued Flomax.  5. Failure to thrive. We offered Megace and Ensure. Medicine input is greatly appreciated. Abdomen CT scan was unremarkable.  6. Cognitive decline. Psychological testing indicates unspecified neurocognitive disorder. Please see psychological consultation.  7. Metabolic syndrome monitoring. Lipid profile, TSH, and Hemoglobin A1c are normal. Prolactin 29.5.  8. R/O Vitamin B12 deficiency. Folic acid and vitamin B12 are normal.  9. Deconditioning. PT help is appreciated.   10. ECT.continue 3 times a week ECT next treatment scheduled for Wednesday. Reviewed labs and EKG. No contraindication to continuing. We'll monitor daily. No change to his medicine for now for the depression which seems to be potentially helping. Patient's blood pressures staying on the low side but he is mostly staying in a wheelchair.   11. Fall. Head CT scan and left knee x-ray were negative.   12. Disposition. The patient will be discharged to home with family. He will follow-up with [facility].  ECT today tolerated well. Clearly benefiting. Tolerating  medicine well. Constipation resolved. He is getting up and moving and walking much better. I still anticipate probably a weeks stay especially because he is not probably going to be able to do outpatient ECT. No other change to medicine today. Dillon RasmussenJohn Clapacs, MD 01/27/2016, 8:00 PM

## 2016-01-27 NOTE — Anesthesia Postprocedure Evaluation (Signed)
Anesthesia Post Note  Patient: Dillon PriestRobert H Romito  Procedure(s) Performed: * No procedures listed *  Patient location during evaluation: PACU Anesthesia Type: General Level of consciousness: awake Pain management: pain level controlled Vital Signs Assessment: post-procedure vital signs reviewed and stable Respiratory status: spontaneous breathing Cardiovascular status: blood pressure returned to baseline Postop Assessment: no headache Anesthetic complications: no    Last Vitals:  Filed Vitals:   01/27/16 0659 01/27/16 0904  BP: 113/81 131/78  Pulse: 66 51  Temp: 36.7 C 35.8 C  Resp: 18 18    Last Pain:  Filed Vitals:   01/27/16 0906  PainSc: 0-No pain                 [name] M

## 2016-01-28 NOTE — Plan of Care (Signed)
Problem: Ineffective individual coping Goal: LTG: Patient will report a decrease in negative feelings Outcome: Progressing Affect is appropriate. Pt rated depression 2/10. Pt laughing and smiling regularly and appropriately.

## 2016-01-28 NOTE — Plan of Care (Signed)
Problem: Ineffective individual coping Goal: STG:Pt. will utilize relaxation techniques to reduce stress STG: Patient will utilize relaxation techniques to reduce stress levels  Outcome: Progressing Instructed on breathing tecnique

## 2016-01-28 NOTE — [facility] Group Notes (Signed)
[facility] LCSW Group Therapy  01/28/2016 9:17 AM  Type of Therapy:  Group Therapy  Participation Level:  Did Not Attend  Modes of Intervention:  Discussion, Education, Socialization and Support  Summary of Progress/Problems: Balance in life: Patients will discuss the concept of balance and how it looks and feels to be unbalanced. Pt will identify areas in their life that is unbalanced and ways to become more balanced.    Sempra EnergyCandace L Ravina [name] MSW, LCSWA  01/28/2016, 9:17 AM

## 2016-01-28 NOTE — Progress Notes (Signed)
[facility] MD Progress Note  01/28/2016 7:43 PM [name]  MRN:  [id number]  Subjective: Update as of Thursday the 13th. Patient reports he is feeling even better. Mood is good. No sign of hopelessness. Totally denies suicidal ideation. He is eating well and cooperative with treatment. Patient feels comfortable with the plan for discharge within the next day or so.  Principal Problem: Severe recurrent major depression without psychotic features ([place]) Diagnosis:   Patient Active Problem List   Diagnosis Date Noted  . Suicidal ideation [R45.851] 01/04/2016  . Severe recurrent major depression without psychotic features ([place]) [F33.2] 12/15/2015  . Grief [F43.21] 12/15/2015  . Weight loss [R63.4] 12/15/2015  . OCD (obsessive compulsive disorder) [F42.9] 12/15/2015  . Benign prostatic hyperplasia with urinary obstruction [N40.1] 11/24/2015  . 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  Past Medical History:  Past Medical History  Diagnosis Date  . Meningitis     spinal  . OCD (obsessive compulsive disorder)   . Kidney stones     Past Surgical History  Procedure Laterality Date  . Kidney stone surgery     Family History:  Family History  Problem Relation Age of Onset  . Cancer Mother     bladder  . Emphysema Father   . Emphysema Sister   . COPD Sister   . Cancer Brother     prostate   Family Psychiatric  History: None reported. Social History:  History  Alcohol Use No     History  Drug Use No    Social History   Social History  . Marital Status: Widowed    Spouse Name: N/A  . Number of Children: N/A  . Years of Education: N/A   Social History Main Topics  . Smoking status: Never Smoker   . Smokeless tobacco: Never Used  . Alcohol Use: No  . Drug Use: No  . Sexual Activity: Not Currently   Other Topics Concern  . None   Social History Narrative    Additional Social History:    History of alcohol / drug use?: No history of alcohol / drug abuse                    Sleep: Fair  Appetite:  Fair  Current Medications: Current Facility-Administered Medications  Medication Dose Route Frequency Provider Last Rate Last Dose  . acetaminophen (TYLENOL) tablet 650 mg  650 mg Oral Q6H PRN Gonzella Lex, MD      . alum & mag hydroxide-simeth (MAALOX/MYLANTA) 200-200-20 MG/5ML suspension 30 mL  30 mL Oral Q4H PRN Gonzella Lex, MD      . docusate sodium (COLACE) capsule 200 mg  200 mg Oral BID Gonzella Lex, MD   200 mg at 01/28/16 0824  . feeding supplement (ENSURE ENLIVE) (ENSURE ENLIVE) liquid 237 mL  237 mL Oral TID BM Jolanta B Pucilowska, MD   237 mL at 01/28/16 1451  . fluvoxaMINE (LUVOX) tablet 100 mg  100 mg Oral QHS Clovis Fredrickson, MD   100 mg at 01/27/16 2209  . labetalol (NORMODYNE,TRANDATE) injection 20 mg  20 mg Intravenous Once Gonzella Lex, MD      . magnesium citrate solution 1 Bottle  1 Bottle Oral Once Gonzella Lex, MD   1 Bottle at 01/25/16 2043  . magnesium hydroxide (MILK OF MAGNESIA) suspension 30 mL  30 mL Oral Daily PRN  Gonzella Lex, MD   30 mL at 01/07/16 1458  . megestrol (MEGACE) 400 MG/10ML suspension 400 mg  400 mg Oral BID Clovis Fredrickson, MD   400 mg at 01/28/16 0823  . OLANZapine zydis (ZYPREXA) disintegrating tablet 5 mg  5 mg Oral QHS Chauncey Mann, MD   5 mg at 01/27/16 2209  . tamsulosin (FLOMAX) capsule 0.4 mg  0.4 mg Oral QPC supper Gonzella Lex, MD   0.4 mg at 01/28/16 1748  . traZODone (DESYREL) tablet 50 mg  50 mg Oral QHS PRN Gonzella Lex, MD        Lab Results:  Results for orders placed or performed during the hospital encounter of 01/04/16 (from the past 48 hour(s))  Glucose, capillary     Status: Abnormal   Collection Time: 01/27/16  6:37 AM  Result Value Ref Range   Glucose-Capillary 113 (H) 65 - 99 mg/dL    Blood Alcohol level:  Lab Results  Component Value  Date   ETH <5 01/04/2016   ETH <5 12/15/2015    Physical Findings: AIMS: Facial and Oral Movements Muscles of Facial Expression: None, normal Lips and Perioral Area: None, normal Jaw: None, normal Tongue: None, normal,Extremity Movements Upper (arms, wrists, hands, fingers): None, normal Lower (legs, knees, ankles, toes): None, normal, Trunk Movements Neck, shoulders, hips: None, normal, Overall Severity Severity of abnormal movements (highest score from questions above): None, normal Incapacitation due to abnormal movements: None, normal Patient's awareness of abnormal movements (rate only patient's report): No Awareness, Dental Status Current problems with teeth and/or dentures?: Yes Does patient usually wear dentures?: No  CIWA:    COWS:     Musculoskeletal: Strength & Muscle Tone: within normal limits Gait & Station: normal Patient leans: N/A  Psychiatric Specialty Exam: Review of Systems  Psychiatric/Behavioral: Negative for depression. The patient is not nervous/anxious.   All other systems reviewed and are negative.   Blood pressure 120/77, pulse 67, temperature 97.9 F (36.6 C), temperature source Oral, resp. rate 20, height 5' 9"  (1.753 m), weight 73.029 kg (161 lb), SpO2 95 %.Body mass index is 23.76 kg/(m^2).  General Appearance: Casual  Eye Contact::  Minimal  Speech:  Clear and Coherent  Volume:  Normal  Mood:  Depressed  Affect:  Blunt  Thought Process:  Goal Directed  Orientation:  Full (Time, Place, and Person)  Thought Content:  WDL  Suicidal Thoughts:  No  Homicidal Thoughts:  No  Memory:  Immediate;   Fair Recent;   Fair Remote;   Fair  Judgement:  Poor  Insight:  Shallow  Psychomotor Activity:  Normal  Concentration:  Fair  Recall:  [place]  Language: Fair  Akathisia:  No  Handed:  Right  AIMS (if indicated):     Assets:  Communication Skills Desire for Improvement Financial Resources/Insurance Housing Physical  Health Resilience Social Support  ADL's:  Intact  Cognition: WNL  Sleep:  Number of Hours: 7.25   Treatment Plan Summary: Daily contact with patient to assess and evaluate symptoms and progress in treatment and Medication management   Mr. Coate is a 65 year old male with a history of anxiety and depression admitted for worsening of depression, malaise and severe weight loss in the context of recent major loss.  1. Suicidal ideation. The patient is passively suicidal unable to function. He is on 15 min checks.    2. Mood/anxietyand psychosis.Continue Luvox for depression and OCD and Zyprexa for psychosis. This was  lowered to 5 mg yesterday due to hypertonia.  3. Insomnia. He responded well to trazodone.   4. BPH. We continued Flomax.  5. Failure to thrive. We offered Megace and Ensure. Medicine input is greatly appreciated. Abdomen CT scan was unremarkable.  6. Cognitive decline. Psychological testing indicates unspecified neurocognitive disorder. Please see psychological consultation.  7. Metabolic syndrome monitoring. Lipid profile, TSH, and Hemoglobin A1c are normal. Prolactin 29.5.  8. R/O Vitamin B12 deficiency. Folic acid and vitamin B12 are normal.  9. Deconditioning. PT help is appreciated.   10. ECT.continue 3 times a week ECT next treatment scheduled for Wednesday. Reviewed labs and EKG. No contraindication to continuing. We'll monitor daily. No change to his medicine for now for the depression which seems to be potentially helping. Patient's blood pressures staying on the low side but he is mostly staying in a wheelchair.   45. Fall. Head CT scan and left knee x-ray were negative.   12. Disposition. Patient has done well with both medication and ECT. Appears to be back to baseline mood. Treatment team met today and discussed the plan with patient. Social work contacted the family and they are agreeable to providing transportation for maintenance ECT. We are planning on  discharge tomorrow and an anticipated follow-up ECT treatment one week from tomorrow. I have agreed to see the patient for medication management outside the hospital. No change to medicine this evening. Alethia Berthold, MD 01/28/2016, 7:43 PM

## 2016-01-28 NOTE — Progress Notes (Addendum)
D: Patient stated slept good last night .Stated appetite  good and energy level  Is normal. Stated concentration is good . Stated on Depression scale 3, hopeless 3 and anxiety 3.( low 0-10 high) Denies suicidal  homicidal ideations  .  No auditory hallucinations  No pain concerns . Appropriate ADL'S. Interacting with peers and staff. Patient aware of  Possibility of going home tomorrow A: Encourage patient participation with unit programming . Instruction  Given on  Medication , verbalize understanding. R: Voice no other concerns. Staff continue to monitor

## 2016-01-28 NOTE — [facility] Group Notes (Signed)
[facility] Group Notes:  (Nursing/MHT/Case Management/Adjunct)  Date:  01/28/2016  Time:  4:47 PM  Type of Therapy:  Psychoeducational Skills  Participation Level:  Active  Participation Quality:  Attentive, Sharing and Supportive  Affect:  Appropriate  Cognitive:  Appropriate  Insight:  Appropriate  Engagement in Group:  Supportive  Modes of Intervention:  Discussion and Education  Summary of Progress/Problems:  Dillon [name] C Dillon [name] 01/28/2016, 4:47 PM

## 2016-01-28 NOTE — Plan of Care (Signed)
Problem: Alteration in mood Goal: LTG-Pt's behavior demonstrates decreased signs of depression (Patient's behavior demonstrates decreased signs of depression to the point the patient is safe to return home and continue treatment in an outpatient setting)  Outcome: Progressing Pt in upbeat mood, making jokes and laughing with RN.

## 2016-01-28 NOTE — Tx Team (Signed)
Interdisciplinary Treatment Plan Update (Adult)  Date:  01/28/2016 Time Reviewed:  2:56 PM  Progress in Treatment: Attending groups: Yes. Participating in groups:  Yes. Taking medication as prescribed:  Yes. Tolerating medication:  Yes. Family/Significant othe contact made:  Yes, individual(s) contacted:  Daughter Patient understands diagnosis:  Yes. Discussing patient identified problems/goals with staff:  Yes. Medical problems stabilized or resolved:  Yes. Denies suicidal/homicidal ideation: Yes. Issues/concerns per patient self-inventory:  Yes. Other:  New problem(s) identified: No, Describe:  NA  Discharge Plan or Barriers: Pt plans to return home and follow up with outpatient.    Reason for Continuation of Hospitalization: Depression Medication stabilization Suicidal ideation  Comments: Pt's mood is continuing to be improved. Affect brighter. Eating well.  Estimated length of stay: Pt will likely d/c tomorrow   New goal(s): NA  Review of initial/current patient goals per problem list:   1.  Goal(s): Patient will participate in aftercare plan * Met:yes  * Target date: at discharge * As evidenced by: Patient will participate within aftercare plan AEB aftercare provider and housing plan at discharge being identified.   2.  Goal (s): Patient will exhibit decreased depressive symptoms and suicidal ideations. * Met: yes *  Target date: at discharge * As evidenced by: Patient will utilize self rating of depression at 3 or below and demonstrate decreased signs of depression or be deemed stable for discharge by MD.   3.  Goal(s): Patient will demonstrate decreased signs and symptoms of anxiety. * Met: yes * Target date: at discharge * As evidenced by: Patient will utilize self rating of anxiety at 3 or below and demonstrated decreased signs of anxiety, or be deemed stable for discharge by MD  4.  Goal (s): Patient will demonstrate decreased symptoms of psychosis. * Met:  Yes  *  Target date: at discharge * As evidenced by: Patient will not endorse signs of psychosis or be deemed stable for discharge by MD.   Attendees: Patient:  Dillon [name] 4/13/20172:56 PM  Family:   4/13/20172:56 PM  Physician:  Dr. Weber Cooks 4/13/20172:56 PM  Nursing:   Silva Bandy, RN  4/13/20172:56 PM  Case Manager:   4/13/20172:56 PM  Counselor:   4/13/20172:56 PM  Other:  Wray Kearns, [place] 4/13/20172:56 PM  Other:   4/13/20172:56 PM  Other:   4/13/20172:56 PM  Other:  4/13/20172:56 PM  Other:  4/13/20172:56 PM  Other:  4/13/20172:56 PM  Other:  4/13/20172:56 PM  Other:  4/13/20172:56 PM  Other:  4/13/20172:56 PM  Other:   4/13/20172:56 PM   Scribe for Treatment Team:   Wray Kearns, MSW, [place]  01/28/2016, 2:56 PM

## 2016-01-28 NOTE — Progress Notes (Signed)
D: Pt is seen in the milieu this evening interacting appropriately with staff and peers. Patient denies SI/HI/AVH at this time, in a jolly mood and jokes freely with RN. Denies pain at this time. A: Emotional support and encouragement provided. Medications and Ensure administered with education. Safety checks maintained. R: Pt remains free from harm. Will continue to monitor.

## 2016-01-28 NOTE — Progress Notes (Signed)
D: Observed pt in dayroom interacting with peers. Patient alert and oriented x4. Patient denies SI/HI/AVH. Pt affect is brighter. Pt joking and smiling regularly and appropriately with staff and peers. Pt active on the milieu and socializing well. Pt discussed being bothered that "I got myself into this mess" but did not linger on it. Pt mentioned thinking of his dead wife and it upsetting him, but he talked about being able to "work thought it." Pt rated depression 2/10 and anxiety 2/10. Pt had no complaints this evening.  A: Offered active listening and support. Provided therapeutic communication. Administered scheduled medications. Encouraged pt to continue actively participating in care.  R: Pt pleasant and cooperative. Pt had some hesitations about taking colace, but Clinical research associatewriter educated pt. Pt medication compliant. Will continue Q15 min. checks. Safety maintained.

## 2016-01-29 MED ORDER — TAMSULOSIN HCL 0.4 MG PO CAPS: 0.4000 mg | ORAL_CAPSULE | Freq: Every day | ORAL | Status: DC

## 2016-01-29 MED ORDER — OLANZAPINE 5 MG PO TBDP: 5.0000 mg | ORAL_TABLET | Freq: Every day | ORAL | Status: DC

## 2016-01-29 MED ORDER — TRAZODONE HCL 50 MG PO TABS: 50.0000 mg | ORAL_TABLET | Freq: Every evening | ORAL | Status: DC | PRN

## 2016-01-29 MED ORDER — DOCUSATE SODIUM 100 MG PO CAPS: 200.0000 mg | ORAL_CAPSULE | Freq: Two times a day (BID) | ORAL | Status: DC

## 2016-01-29 MED ORDER — FLUVOXAMINE MALEATE 100 MG PO TABS: 100.0000 mg | ORAL_TABLET | Freq: Every day | ORAL | Status: DC

## 2016-01-29 NOTE — Discharge Summary (Signed)
Physician Discharge Summary Note  Patient:  Dillon [name] is an 65 y.o., male MRN:  [id number] DOB:  Aug 15, 1951 Patient phone:  [phone] (home)  Patient address:   [place] [address],  Total Time spent with patient: 50  Date of Admission:  01/04/2016 Date of Discharge: 01/29/2016  Reason for Admission:  65 year old male with a history of obsessive-compulsive disorder and recurrent depression who was admitted to the hospital with serious suicidal ideation and possible attempt at self injury. Patient was extremely depressed withdrawn and somewhat confused in his thinking on admission. Recurrent admission after a previous hospitalization under similar circumstances  Principal Problem: Severe recurrent major depression without psychotic features [facility]) Discharge Diagnoses: Patient Active Problem List   Diagnosis Date Noted  . Suicidal ideation [R45.851] 01/04/2016  . Severe recurrent major depression without psychotic features ([place]) [F33.2] 12/15/2015  . Grief [F43.21] 12/15/2015  . Weight loss [R63.4] 12/15/2015  . OCD (obsessive compulsive disorder) [F42.9] 12/15/2015  . Benign prostatic hyperplasia with urinary obstruction [N40.1] 11/24/2015  . 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 History of suicidal behavior. Has had some appropriate medication management in the past but has not been fully compliant.  Past Medical History:  Past Medical History  Diagnosis Date  . Meningitis     spinal  . OCD (obsessive compulsive disorder)   . Kidney stones     Past Surgical History  Procedure Laterality Date  . Kidney stone surgery     Family History:  Family History  Problem Relation Age of Onset  . Cancer Mother     bladder  . Emphysema Father   . Emphysema Sister   . COPD Sister   . Cancer  Brother     prostate   Family Psychiatric  History: Patient has no known family history of mental illness Social History:  History  Alcohol Use No     History  Drug Use No    Social History   Social History  . Marital Status: Widowed    Spouse Name: N/A  . Number of Children: N/A  . Years of Education: N/A   Social History Main Topics  . Smoking status: Never Smoker   . Smokeless tobacco: Never Used  . Alcohol Use: No  . Drug Use: No  . Sexual Activity: Not Currently   Other Topics Concern  . None   Social History Narrative    Hospital Course:  After admission to the hospital he was initially started on medication and has been treated with fluvoxamine and Zyprexa for anxious depressed psychotic symptoms. Patient has been compliant with medication. Initially he was very withdrawn and barely eating. It took quite a while to coach him to start eating a little better and get out of bed. Patient was referred for ECT treatment and was started on treatment a little over a week ago. He has now had 4 ECT treatments and has tolerated them very well. He has shown to need improvement in his symptoms. He is much more physically active getting up and moving around no longer needing a wheelchair going to some groups and eating much better. Patient reports that his mood is no longer feeling depressed and denies any suicidal ideation and denies any psychotic symptoms. He is compliant with medicine. We have been in touch with his daughter  who is agreeable to transport for maintenance ECT treatment after discharge. Plan will be for discharge today and he will come back for a maintenance ECT treatment in 1 week and we'll continue with outpatient medication management.  Physical Findings: AIMS: Facial and Oral Movements Muscles of Facial Expression: None, normal Lips and Perioral Area: None, normal Jaw: None, normal Tongue: None, normal,Extremity Movements Upper (arms, wrists, hands, fingers):  None, normal Lower (legs, knees, ankles, toes): None, normal, Trunk Movements Neck, shoulders, hips: None, normal, Overall Severity Severity of abnormal movements (highest score from questions above): None, normal Incapacitation due to abnormal movements: None, normal Patient's awareness of abnormal movements (rate only patient's report): No Awareness, Dental Status Current problems with teeth and/or dentures?: Yes Does patient usually wear dentures?: No  CIWA:    COWS:     Musculoskeletal: Strength & Muscle Tone: within normal limits Gait & Station: normal Patient leans: N/A  Psychiatric Specialty Exam: Review of Systems  Constitutional: Negative.   HENT: Negative.   Eyes: Negative.   Respiratory: Negative.   Cardiovascular: Negative.   Gastrointestinal: Negative.   Musculoskeletal: Negative.   Skin: Negative.   Neurological: Negative.   Psychiatric/Behavioral: Negative for depression, suicidal ideas, hallucinations, memory loss and substance abuse. The patient is not nervous/anxious and does not have insomnia.     Blood pressure 114/74, pulse 87, temperature 97.7 F (36.5 C), temperature source Oral, resp. rate 18, height 5' 9"  (1.753 m), weight 73.029 kg (161 lb), SpO2 95 %.Body mass index is 23.76 kg/(m^2).  General Appearance: Casual  Eye Contact::  Good  Speech:  Slow  Volume:  Decreased  Mood:  Euthymic  Affect:  Constricted  Thought Process:  Goal Directed  Orientation:  Full (Time, Place, and Person)  Thought Content:  Negative  Suicidal Thoughts:  No  Homicidal Thoughts:  No  Memory:  Immediate;   Good Recent;   Fair Remote;   Fair  Judgement:  Fair  Insight:  Fair  Psychomotor Activity:  Negative  Concentration:  Fair  Recall:  [place]  Language: Fair  Akathisia:  No  Handed:  Right  AIMS (if indicated):     Assets:  Communication Skills Desire for Improvement Financial Resources/Insurance Housing Resilience Social Support   ADL's:  Intact  Cognition: WNL  Sleep:  Number of Hours: 8   Have you used any form of tobacco in the last 30 days? (Cigarettes, Smokeless Tobacco, Cigars, and/or Pipes): No  Has this patient used any form of tobacco in the last 30 days? (Cigarettes, Smokeless Tobacco, Cigars, and/or Pipes) Yes, No  Blood Alcohol level:  Lab Results  Component Value Date   [facility] <5 01/04/2016   ETH <5 41/96/2229    Metabolic Disorder Labs:  Lab Results  Component Value Date   HGBA1C 5.0 01/05/2016   Lab Results  Component Value Date   PROLACTIN 29.7* 01/05/2016   Lab Results  Component Value Date   CHOL 140 01/05/2016   TRIG 90 01/05/2016   HDL 43 01/05/2016   CHOLHDL 3.3 01/05/2016   VLDL 18 01/05/2016   LDLCALC 79 01/05/2016    See Psychiatric Specialty Exam and Suicide Risk Assessment completed by Attending Physician prior to discharge.  Discharge destination:  Other:  Patient will be following up with me in the outpatient clinic and will also have maintenance ECT treatment. Medical follow-up with his usual primary care doctor  Is patient on multiple antipsychotic therapies at discharge:  No   Has Patient  had three or more failed trials of antipsychotic monotherapy by history:  No  Recommended Plan for Multiple Antipsychotic Therapies: NA  Discharge Instructions    Diet - low sodium heart healthy    Complete by:  As directed      Discharge instructions    Complete by:  As directed   Patient is to continue current medications as prescribed. He will follow-up for maintenance ECT treatment one week from today on April 21. He will make arrangements to follow-up with me in my outpatient clinic for medication management.     Increase activity slowly    Complete by:  As directed             Medication List    TAKE these medications      Indication   docusate sodium 100 MG capsule  Commonly known as:  COLACE  Take 2 capsules (200 mg total) by mouth 2 (two) times daily.       fluvoxaMINE 100 MG tablet  Commonly known as:  LUVOX  Take 1 tablet (100 mg total) by mouth at bedtime.      OLANZapine zydis 5 MG disintegrating tablet  Commonly known as:  ZYPREXA  Take 1 tablet (5 mg total) by mouth at bedtime.      tamsulosin 0.4 MG Caps capsule  Commonly known as:  FLOMAX  Take 1 capsule (0.4 mg total) by mouth daily after supper.      traZODone 50 MG tablet  Commonly known as:  DESYREL  Take 1 tablet (50 mg total) by mouth at bedtime as needed for sleep.            Follow-up Information    Follow up with Kathrine Haddock, NP On 02/03/2016.   Specialties:  Nurse Practitioner, Family Medicine   Why:  You have a primary care appointment on Wednesday April 19th at 10:30 am. If you cannot make this appointment, please call at least 24 hours in advance to reschedule.    Contact information:   [address] [place] [address] [phone]       Follow up with Alethia Berthold, MD On 02/05/2016.   Specialty:  Psychiatry   Why:  Your next appointment for maintance ECT is Friday April 21st at [facility] information:   [address] [place] [address] [phone]       Follow-up recommendations:  Activity:  Gradually increase activity but with a strong encouragement to be physically active around the house. Diet:  Regular diet Other:  Maintenance ECT scheduled in one week family is agreeable to transport. Medication outpatient appointment scheduled as well.  Comments:  Patient is agreeable to the plan for discharge. Family is also agreeable to the plan. Full treatment team met yesterday and reviewed plan with no changes as of today.  Signed: Alethia Berthold, MD 01/29/2016, 1:27 PM

## 2016-01-29 NOTE — [facility] Group Notes (Signed)
[facility] LCSW Aftercare Discharge Planning Group Note   01/29/2016 10:46 AM  Participation Quality:  Did not attend   Nanda Bittick L Cald[place] Leap MSW, LCSWA    

## 2016-01-29 NOTE — [facility] Suicide Risk Assessment (Signed)
[facility] Discharge Suicide Risk Assessment   Principal Problem: Severe recurrent major depression without psychotic features [facility]) Discharge Diagnoses:  Patient Active Problem List   Diagnosis Date Noted  . Suicidal ideation [R45.851] 01/04/2016  . Severe recurrent major depression without psychotic features ([facility]) [F33.2] 12/15/2015  . Grief [F43.21] 12/15/2015  . Weight loss [R63.4] 12/15/2015  . OCD (obsessive compulsive disorder) [F42.9] 12/15/2015  . Benign prostatic hyperplasia with urinary obstruction [N40.1] 11/24/2015  . 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   HENT: Negative.   Eyes: Negative.   Respiratory: Negative.   Cardiovascular: Negative.   Gastrointestinal: Negative.   Musculoskeletal: Negative.   Skin: Negative.   Neurological: Negative.   Psychiatric/Behavioral: Negative for depression, suicidal ideas, hallucinations, memory loss and substance abuse. The patient is not nervous/anxious and does not have insomnia.     Blood pressure 114/74, pulse 87, temperature 97.7 F (36.5 C), temperature source Oral, resp. rate 18, height  (1.753 m), weight 73.029 kg (161 lb), SpO2 95 %.Body mass index is 23.76 kg/(m^2).  General Appearance: Casual  Eye Contact::  Good  Speech:  Slow409  Volume:  Decreased  Mood:  Euthymic  Affect:  Congruent  Thought Process:  Goal Directed  Orientation:  Full (Time, Place, and Person)  Thought Content:  Negative  Suicidal Thoughts:  No  Homicidal Thoughts:  No  Memory:  Immediate;   Good Recent;   Fair Remote;   Fair  Judgement:  Fair  Insight:  Fair  Psychomotor Activity:  Decreased  Concentration:  Fair  Recall:  [employer] of Knowledge:Fair  Language: Fair  Akathisia:  No  Handed:  Right   AIMS (if indicated):     Assets:  Desire for Improvement Housing Social Support  Sleep:  Number of Hours: 8  Cognition: WNL  ADL's:  Intact   Mental Status Per Nursing Assessment::   On Admission:  NA (denies)  Demographic Factors:  Male, Divorced or widowed, Caucasian and Living alone  Loss Factors: Loss of significant relationship  Historical Factors: Prior suicide attempts  Risk Reduction Factors:   Religious beliefs about death, Positive social support and Positive therapeutic relationship  Continued Clinical Symptoms:  Obsessive-Compulsive Disorder  Cognitive Features That Contribute To Risk:  None    Suicide Risk:  Mild:  Suicidal ideation of limited frequency, intensity, duration, and specificity.  There are no identifiable plans, no associated intent, mild dysphoria and related symptoms, good self-control (both objective and subjective assessment), few other risk factors, and identifiable protective factors, including available and accessible social support.  Follow-up Information    Follow up with Gabriel Cirri, NP On 02/03/2016.   Specialties:  Nurse Practitioner, Family Medicine   Why:  You have a primary care appointment on Wednesday April 19th at 10:30 am. If you cannot make this appointment, please call at least 24 hours in advance to reschedule.    Contact information:   [address] [place] [address] [phone]       Follow up with [name], MD On 02/05/2016.   Specialty:  Psychiatry   Why:  Your next appointment for maintance ECT is Friday April 21st at [facility] information:   [address] [place] [address] [phone]       Plan  Of Care/Follow-up recommendations:  Activity:  Patient is strongly encouraged to get some physical activity and try and have some normal things that he does during the day rather than to stay in bed. Diet:  Regular diet Other:  He will be following up initially for maintenance ECT and  also with outpatient medication management  Mordecai RasmussenJohn Fabyan Loughmiller, MD 01/29/2016, 1:24 PM

## 2016-01-29 NOTE — [facility] Group Notes (Signed)
[facility] LCSW Group Therapy  01/29/2016 1:54 PM  Type of Therapy:  Group Therapy  Participation Level:  Minimal  Participation Quality:  Attentive  Affect:  Appropriate   Cognitive:  Alert   Insight:  Improving   Engagement in Therapy:  Improving   Modes of Intervention:  Discussion, Education, Socialization and Support  Summary of Progress/Problems: Feelings around Relapse. Group members discussed the meaning of relapse and shared personal stories of relapse, how it affected them and others, and how they perceived themselves during this time. Group members were encouraged to identify triggers, warning signs and coping skills used when facing the possibility of relapse. Social supports were discussed and explored in detail. Pt attended group and stayed the entire time. Pt sat quietly and listened to other group members share.    Sempra EnergyCandace L Bobbye Petti MSW, LCSWA  01/29/2016, 1:54 PM

## 2016-01-29 NOTE — Progress Notes (Signed)
  [facility] :  Will you be returning to the same living situation after discharge:  Yes,  home  At discharge, do you have transportation home?: Yes,  daughter Do you have the ability to pay for your medications: Yes,  insurance   Release of information consent forms completed and in the chart;  Patient's signature needed at discharge.  Patient to Follow up at: Follow-up Information    Follow up with Gabriel Cirriheryl Wicker, NP On 02/03/2016.   Specialties:  Nurse Practitioner, Family Medicine   Why:  You have a primary care appointment on Wednesday April 19th at 10:30 am. If you cannot make this appointment, please call at least 24 hours in advance to reschedule.    Contact information:   [address] [place] [id number] [phone]       Follow up with Mordecai RasmussenJohn Clapacs, MD On 02/05/2016.   Specialty:  Psychiatry   Why:  Your next appointment for maintance ECT is Friday April 21st at 8:00am.    Contact information:   [address] [place] [address] [phone]       Next level of care provider [facility] Link:no  Safety Planning and Suicide Prevention discussed: Yes,  with patient and daughter   Have you used any form of tobacco in the last 30 days? (Cigarettes, Smokeless Tobacco, Cigars, and/or Pipes): No  Has patient been referred to the Quitline?: N/A patient is not a smoker  Patient has been referred for addiction treatment: N/A  Rondall Allegraandace L Cristel Rail MSW, LCSWA  01/29/2016, 12:03 PM

## 2016-01-29 NOTE — [facility] Group Notes (Signed)
[facility] Group Notes:  (Nursing/MHT/Case Management/Adjunct)  Date:  01/29/2016  Time:  2:22 AM  Type of Therapy:  Group Therapy  Participation Level:  Active  Participation Quality:  Appropriate  Affect:  Flat  Cognitive:  Alert  Insight:  Improving  Engagement in Group:  Developing/Improving  Modes of Intervention:  n/a  Summary of Progress/Problems:  Dillon [name] 01/29/2016, 2:22 AM

## 2016-01-29 NOTE — Progress Notes (Signed)
Patient discharged home. DC instructions provided and explained. Medications reviewed. Rx given. All questions answered. Pt stable at discharge. Denies SI, HI, AVH. Belongings returned from locker and 384$ from safe returned.

## 2016-02-03 ENCOUNTER — Ambulatory Visit (INDEPENDENT_AMBULATORY_CARE_PROVIDER_SITE_OTHER): Payer: BLUE CROSS/BLUE SHIELD | Admitting: Unknown Physician Specialty

## 2016-02-03 ENCOUNTER — Telehealth (HOSPITAL_COMMUNITY): Payer: Self-pay | Admitting: *Deleted

## 2016-02-03 ENCOUNTER — Encounter: Payer: Self-pay | Admitting: Unknown Physician Specialty

## 2016-02-03 VITALS — BP 118/81 | HR 85 | Temp 98.6°F | Ht 69.0 in | Wt 171.4 lb

## 2016-02-03 DIAGNOSIS — F332 Major depressive disorder, recurrent severe without psychotic features: Secondary | ICD-10-CM

## 2016-02-03 DIAGNOSIS — Z09 Encounter for follow-up examination after completed treatment for conditions other than malignant neoplasm: Secondary | ICD-10-CM

## 2016-02-03 NOTE — Telephone Encounter (Signed)
Called [employer] for ECT authorization spoke with Lyla SonCarrie who gave 14 sessions starting 4/21-6/1 auth #[id number].

## 2016-02-03 NOTE — Progress Notes (Signed)
BP 118/81 mmHg  Pulse 85  Temp(Src) 98.6 F (37 C)  Ht 5\' 9"  (1.753 m)  Wt 171 lb 6.4 oz (77.747 kg)  BMI 25.30 kg/m2  SpO2 97%   Subjective:    Patient ID: Dillon [name], male    DOB: September 11, 1951, 65 y.o.   MRN: [id number]  HPI: Dillon [name] is a 65 y.o. male  Chief Complaint  Patient presents with  . Hospitalization Follow-up    in hospital for depression   Depression Pt is here with his daughter.to follow-up depression.  He is in a much better mood and on a treatment plan.  Today he is reflective of his previous mood.  Feels he is tolerating his medications.  He is planning to continue seeing Dr. Delaney Meigslaypacs weekly.     Depression screen [facility] 2/[facility] 02/03/2016 12/25/2015 12/15/2015 12/01/2015 09/07/2015  Decreased Interest 0 1 3 3 3   Down, Depressed, Hopeless 1 1 3 3 2   PHQ - 2 Score 1 2 6 6 5   Altered sleeping - 3 - 3 3  Tired, decreased energy - 3 3 3 3   Change in appetite - 1 3 3  0  Feeling bad or failure about yourself  - 1 3 3 2   Trouble concentrating - 0 - 2 0  Moving slowly or fidgety/restless - 1 3 3  0  Suicidal thoughts - 0 (No Data) 0 0  PHQ-9 Score - 11 - 23 13  Difficult doing work/chores - Not difficult at all - - -      Relevant past medical, surgical, family and social history reviewed and updated as indicated. Interim medical history since our last visit reviewed. Allergies and medications reviewed and updated.  Review of Systems  Per HPI unless specifically indicated above     Objective:    BP 118/81 mmHg  Pulse 85  Temp(Src) 98.6 F (37 C)  Ht 5\' 9"  (1.753 m)  Wt 171 lb 6.4 oz (77.747 kg)  BMI 25.30 kg/m2  SpO2 97%  Wt Readings from Last 3 Encounters:  02/03/16 171 lb 6.4 oz (77.747 kg)  01/27/16 161 lb (73.029 kg)  01/04/16 165 lb (74.844 kg)    Physical Exam  Constitutional: He is oriented to person, place, and time. He appears well-developed and well-nourished. No distress.  HENT:  Head: Normocephalic and atraumatic.  Eyes:  Conjunctivae and lids are normal. Right eye exhibits no discharge. Left eye exhibits no discharge. No scleral icterus.  Neck: Normal range of motion. Neck supple. No JVD present. Carotid bruit is not present.  Cardiovascular: Normal rate, regular rhythm and normal heart sounds.   Pulmonary/Chest: Effort normal and breath sounds normal. No respiratory distress.  Abdominal: Normal appearance. There is no splenomegaly or hepatomegaly.  Musculoskeletal: Normal range of motion.  Neurological: He is alert and oriented to person, place, and time.  Skin: Skin is warm, dry and intact. No rash noted. No pallor.  Psychiatric: He has a normal mood and affect. His behavior is normal. Judgment and thought content normal.    Results for orders placed or performed during the hospital encounter of 01/04/16  Lipid panel  Result Value Ref Range   Cholesterol 140 0 - 200 mg/dL   Triglycerides 90 <409<150 mg/dL   HDL 43 >81>40 mg/dL   Total CHOL/HDL Ratio 3.3 RATIO   VLDL 18 0 - 40 mg/dL   LDL Cholesterol 79 0 - 99 mg/dL  Hemoglobin X9JA1c  Result Value Ref Range   Hgb A1c MFr Bld  5.0 4.0 - 6.0 %  TSH  Result Value Ref Range   TSH 1.170 0.350 - 4.500 uIU/mL  Prolactin  Result Value Ref Range   Prolactin 29.7 (H) 4.0 - 15.2 ng/mL  Vitamin B12  Result Value Ref Range   Vitamin B-12 517 180 - 914 pg/mL  Folate  Result Value Ref Range   Folate 10.0 >5.9 ng/mL  Glucose, capillary  Result Value Ref Range   Glucose-Capillary 117 (H) 65 - 99 mg/dL  CBC  Result Value Ref Range   WBC 10.1 3.8 - 10.6 K/uL   RBC 5.81 4.40 - 5.90 MIL/uL   Hemoglobin 18.4 (H) 13.0 - 18.0 g/dL   HCT 82.9 56.2 - 13.0 %   MCV 88.2 80.0 - 100.0 fL   MCH 31.7 26.0 - 34.0 pg   MCHC 35.9 32.0 - 36.0 g/dL   RDW 86.5 78.4 - 69.6 %   Platelets 153 150 - 440 K/uL  Basic metabolic panel  Result Value Ref Range   Sodium 139 135 - 145 mmol/L   Potassium 4.2 3.5 - 5.1 mmol/L   Chloride 109 101 - 111 mmol/L   CO2 25 22 - 32 mmol/L    Glucose, Bld 140 (H) 65 - 99 mg/dL   BUN 24 (H) 6 - 20 mg/dL   Creatinine, Ser 2.95 (H) 0.61 - 1.24 mg/dL   Calcium 28.4 8.9 - 13.2 mg/dL   GFR calc non Af Amer 59 (L) >60 mL/min   GFR calc Af Amer >60 >60 mL/min   Anion gap 5 5 - 15  CBC  Result Value Ref Range   WBC 9.5 3.8 - 10.6 K/uL   RBC 4.91 4.40 - 5.90 MIL/uL   Hemoglobin 15.4 13.0 - 18.0 g/dL   HCT 44.0 10.2 - 72.5 %   MCV 88.4 80.0 - 100.0 fL   MCH 31.4 26.0 - 34.0 pg   MCHC 35.5 32.0 - 36.0 g/dL   RDW 36.6 44.0 - 34.7 %   Platelets 158 150 - 440 K/uL  Basic metabolic panel  Result Value Ref Range   Sodium 139 135 - 145 mmol/L   Potassium 4.1 3.5 - 5.1 mmol/L   Chloride 109 101 - 111 mmol/L   CO2 22 22 - 32 mmol/L   Glucose, Bld 186 (H) 65 - 99 mg/dL   BUN 30 (H) 6 - 20 mg/dL   Creatinine, Ser 4.25 0.61 - 1.24 mg/dL   Calcium 9.5 8.9 - 95.6 mg/dL   GFR calc non Af Amer >60 >60 mL/min   GFR calc Af Amer >60 >60 mL/min   Anion gap 8 5 - 15  Glucose, capillary  Result Value Ref Range   Glucose-Capillary 144 (H) 65 - 99 mg/dL  Glucose, capillary  Result Value Ref Range   Glucose-Capillary 113 (H) 65 - 99 mg/dL      Assessment & Plan:   Problem List Items Addressed This Visit      Unprioritized   Hospital discharge follow-up   Severe recurrent major depression without psychotic features ([facility]CC) - Primary    This is currently stable.  There is a treatment plan in place.  He is planning on taking his medications.            Follow up plan: Return in about 6 months (around 08/04/2016) for physical.

## 2016-02-03 NOTE — Assessment & Plan Note (Signed)
This is currently stable.  There is a treatment plan in place.  He is planning on taking his medications.

## 2016-02-04 ENCOUNTER — Other Ambulatory Visit: Payer: Self-pay | Admitting: Psychiatry

## 2016-02-05 ENCOUNTER — Encounter
Admission: RE | Admit: 2016-02-05 | Discharge: 2016-02-05 | Disposition: A | Discharge status: Home / Self Care | Payer: BLUE CROSS/BLUE SHIELD | Source: Ambulatory Visit | Attending: Psychiatry | Admitting: Psychiatry

## 2016-02-05 NOTE — Discharge Summary (Signed)
1045 Pt arrived and states he is feeling good and does not think he needs procedure today.  Dr Toni Amendlapacs in to talk with pt.  Procedure canceled per Dr Toni Amendlapacs.  Inst pt to call his office for appointment for meds.  Daughter also informed of the appointment with [name] and to let us know if he needs another ECT in future. Pt and daughter verbalized understanding of inst given

## 2016-02-05 NOTE — Progress Notes (Signed)
Patient was called at the number [phone] with no answer. Will attempt again shortly.

## 2016-07-07 ENCOUNTER — Other Ambulatory Visit: Payer: Self-pay | Admitting: Psychiatry

## 2016-07-07 NOTE — Telephone Encounter (Signed)
Patient is not a regular outpatient of mine. I am refilling this prescription but he needs to find an outpatient provider.

## 2016-08-02 ENCOUNTER — Telehealth: Payer: Self-pay | Admitting: Unknown Physician Specialty

## 2016-08-02 NOTE — Telephone Encounter (Signed)
pt called to to cancell his apppointment on the 20th. He stated that he was fine and he didn't wish to reschedule at this time.

## 2016-08-05 ENCOUNTER — Encounter: Payer: BLUE CROSS/BLUE SHIELD | Admitting: Unknown Physician Specialty

## 2016-09-25 IMAGING — CT CT HEAD W/O CM
2 of 4 series · 14 of 30 positions shown, 16 images · non-contrast
Comparison: None.

CLINICAL DATA: Fell getting out of bed. Denies loss of
consciousness. Initial encounter.

EXAM:
CT HEAD WITHOUT CONTRAST
TECHNIQUE: Contiguous axial images were obtained from the base of the skull
through the vertex without intravenous contrast.

[Series 2: head wo · axial · 0.42mm/px · z∈[-95,+17]mm · 6 of 33 slices shown, 8 images]
[im 5/33  brain]
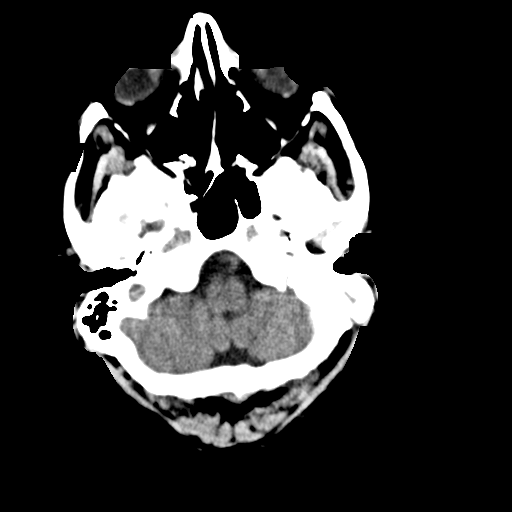
[im 5/33  bone]
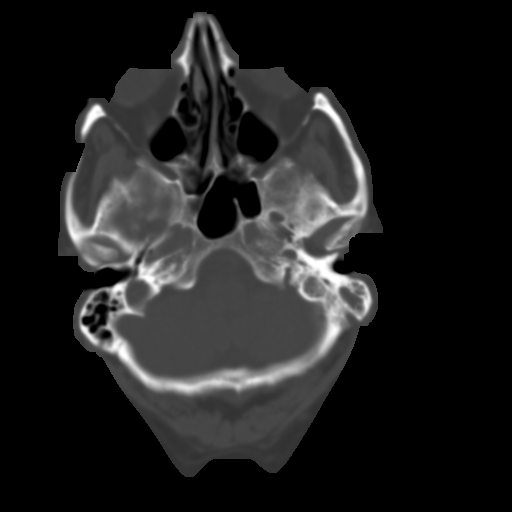
[im 10/33  brain]
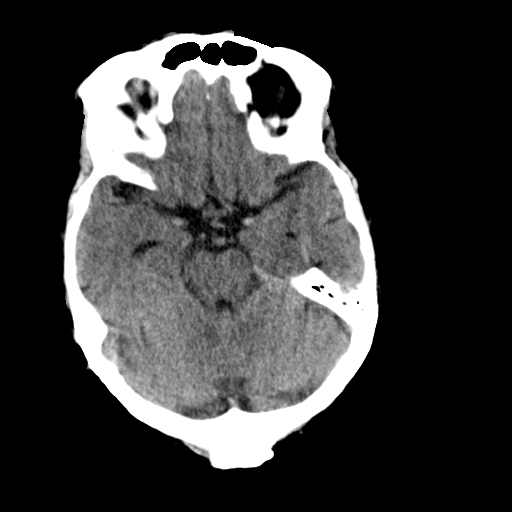
[im 14/33  brain]
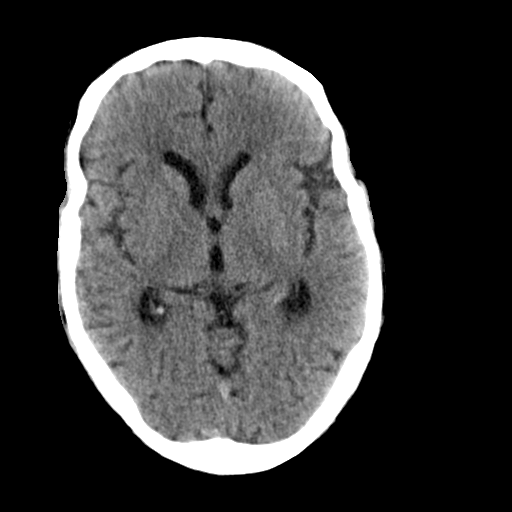
[im 19/33  brain]
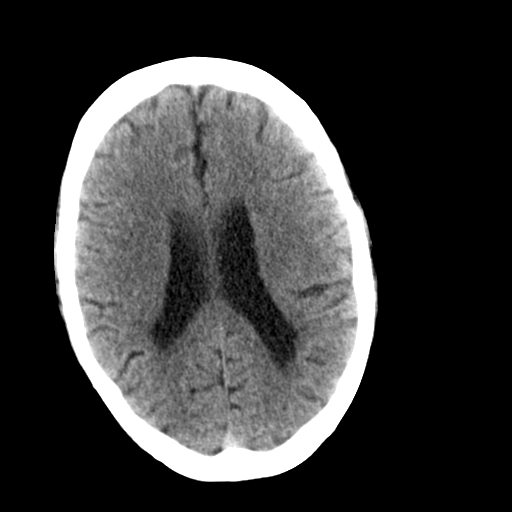
[im 23/33  brain]
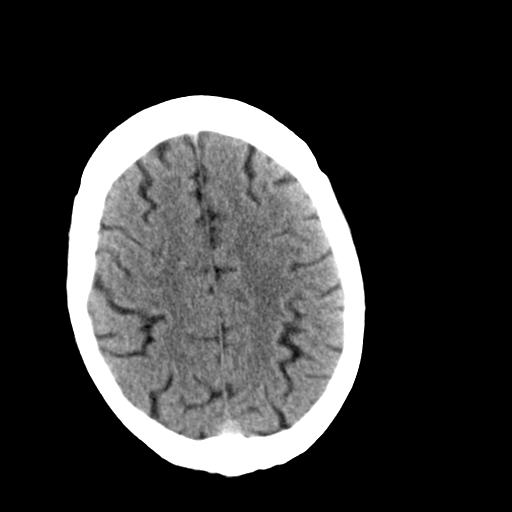
[im 23/33  bone]
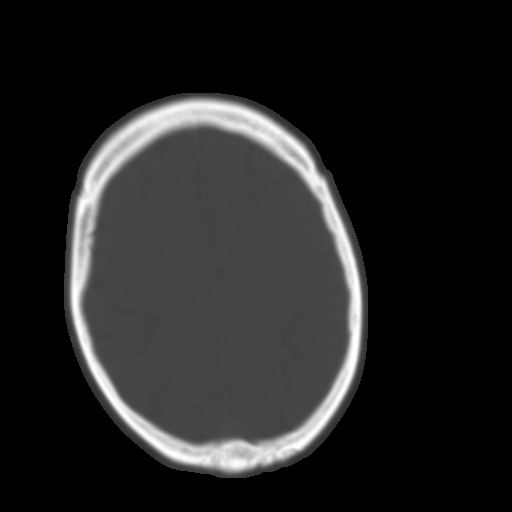
[im 28/33  brain]
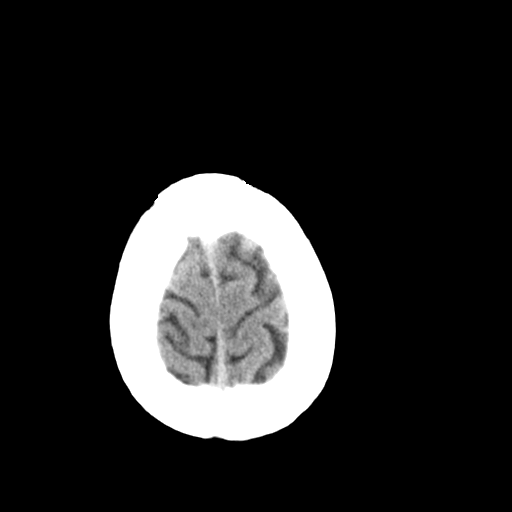

[Series 3: head bone · axial · 0.42mm/px · z∈[-96,+44]mm · 8 of 72 slices shown]
[im 9/72  bone]
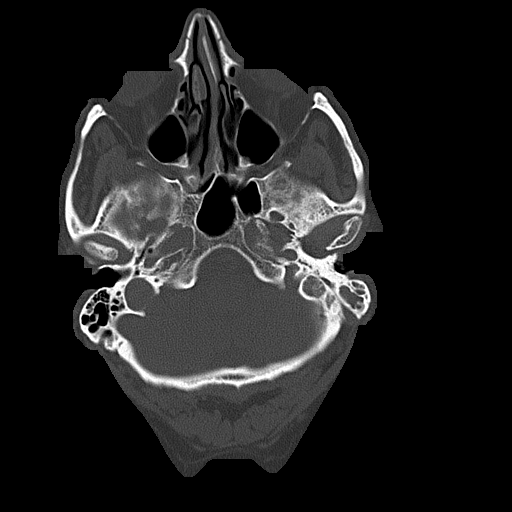
[im 17/72  bone]
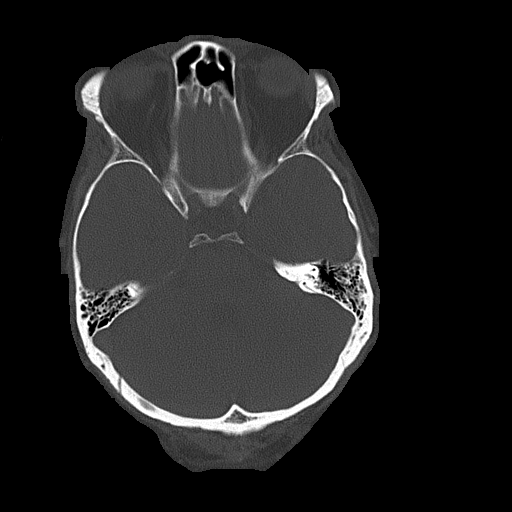
[im 26/72  bone]
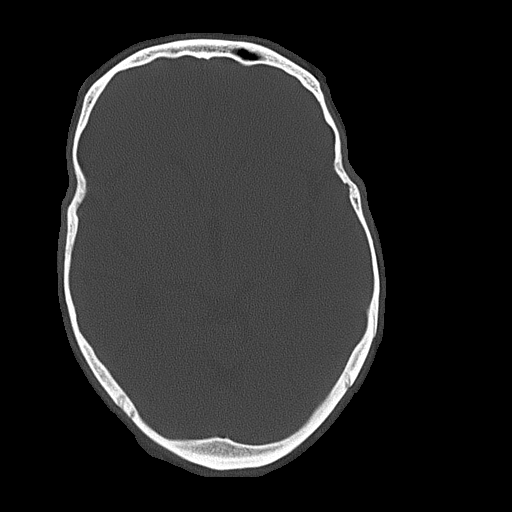
[im 34/72  bone]
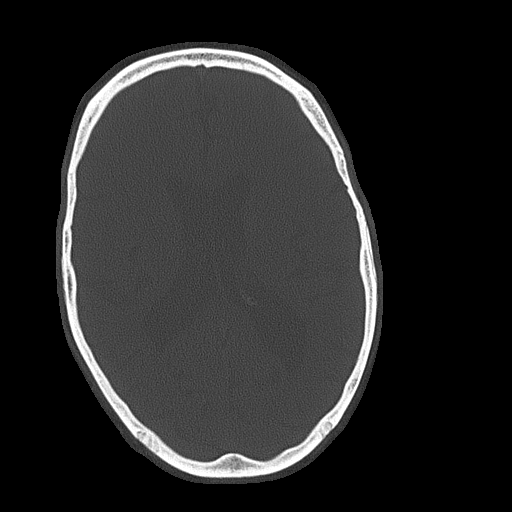
[im 42/72  bone]
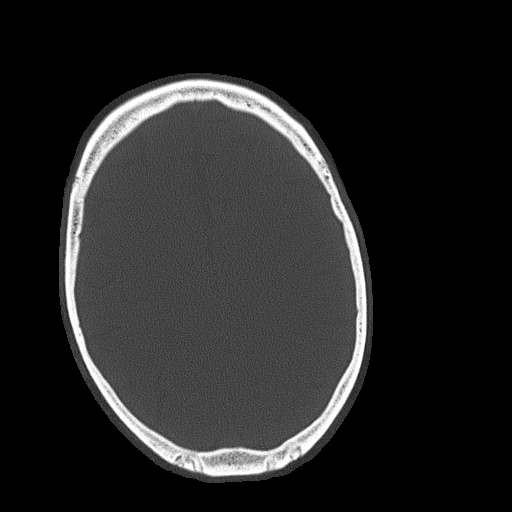
[im 51/72  bone]
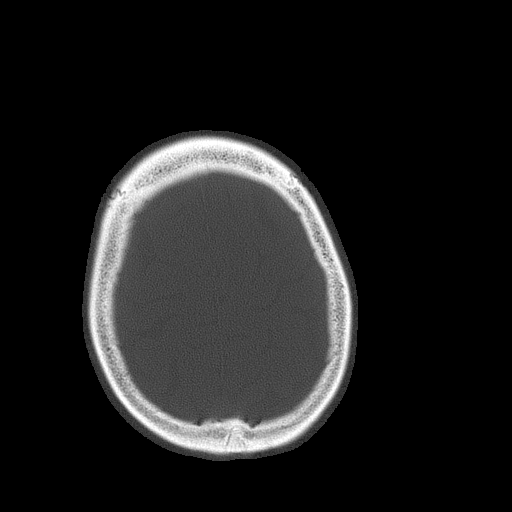
[im 59/72  bone]
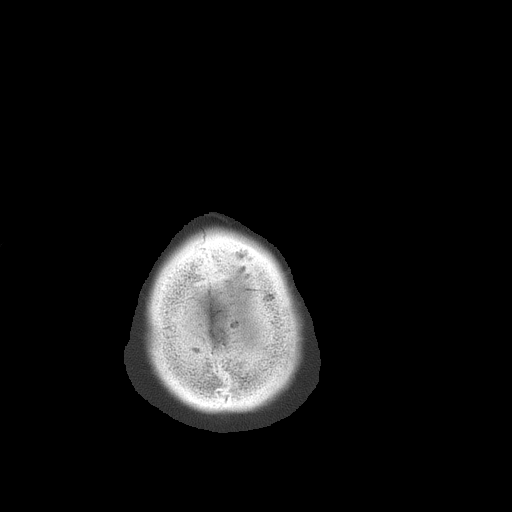
[im 67/72  bone]
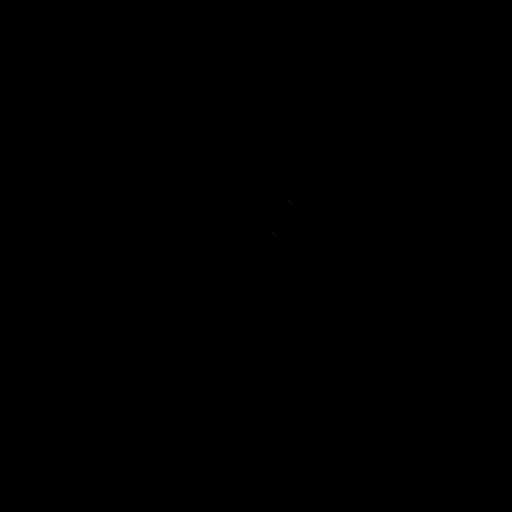

[14 of 30 positions shown; findings below may reference images not displayed]

FINDINGS: No skull fracture or intracranial hemorrhage.

No CT evidence of large acute infarct.

No age advanced atrophy or hydrocephalus.

No intracranial mass lesion noted on this unenhanced exam.

Orbital structures unremarkable.

Partial opacification mastoid air cells greater on left. No
obstructing lesion of the eustachian tube noted. Polypoid
opacification right maxillary sinus.
IMPRESSION: No skull fracture or intracranial hemorrhage.

Partial opacification mastoid air cells greater on left. Polypoid
opacification right maxillary sinus.

## 2017-01-02 ENCOUNTER — Other Ambulatory Visit: Payer: Self-pay | Admitting: Psychiatry

## 2017-02-03 ENCOUNTER — Other Ambulatory Visit: Payer: Self-pay | Admitting: Psychiatry

## 2017-03-15 ENCOUNTER — Inpatient Hospital Stay: Admission: RE | Admit: 2017-03-15 | Payer: Self-pay | Source: Ambulatory Visit

## 2017-03-20 ENCOUNTER — Telehealth: Payer: Self-pay | Admitting: *Deleted

## 2017-03-22 ENCOUNTER — Emergency Department
Admission: EM | Admit: 2017-03-22 | Discharge: 2017-03-22 | Disposition: A | Discharge status: Home / Self Care | Payer: BLUE CROSS/BLUE SHIELD | Attending: Student in an Organized Health Care Education/Training Program | Admitting: Student in an Organized Health Care Education/Training Program

## 2017-03-22 ENCOUNTER — Encounter: Payer: Self-pay | Admitting: *Deleted

## 2017-03-22 ENCOUNTER — Inpatient Hospital Stay
Admission: AD | Admit: 2017-03-22 | Discharge: 2017-04-06 | DRG: 885 | Disposition: A | Discharge status: Home / Self Care | Payer: BLUE CROSS/BLUE SHIELD | Source: Intra-hospital | Attending: Psychiatry | Admitting: Psychiatry

## 2017-03-22 DIAGNOSIS — R627 Adult failure to thrive: Secondary | ICD-10-CM | POA: Insufficient documentation

## 2017-03-22 DIAGNOSIS — F429 Obsessive-compulsive disorder, unspecified: Secondary | ICD-10-CM | POA: Diagnosis present

## 2017-03-22 DIAGNOSIS — F329 Major depressive disorder, single episode, unspecified: Secondary | ICD-10-CM | POA: Insufficient documentation

## 2017-03-22 DIAGNOSIS — N400 Enlarged prostate without lower urinary tract symptoms: Secondary | ICD-10-CM | POA: Diagnosis present

## 2017-03-22 DIAGNOSIS — N138 Other obstructive and reflux uropathy: Secondary | ICD-10-CM | POA: Diagnosis present

## 2017-03-22 DIAGNOSIS — E8729 Other acidosis: Secondary | ICD-10-CM

## 2017-03-22 DIAGNOSIS — G4700 Insomnia, unspecified: Secondary | ICD-10-CM | POA: Diagnosis present

## 2017-03-22 DIAGNOSIS — Z79899 Other long term (current) drug therapy: Secondary | ICD-10-CM | POA: Insufficient documentation

## 2017-03-22 DIAGNOSIS — R45851 Suicidal ideations: Secondary | ICD-10-CM | POA: Diagnosis present

## 2017-03-22 DIAGNOSIS — E872 Acidosis: Secondary | ICD-10-CM

## 2017-03-22 DIAGNOSIS — E876 Hypokalemia: Secondary | ICD-10-CM

## 2017-03-22 DIAGNOSIS — F332 Major depressive disorder, recurrent severe without psychotic features: Secondary | ICD-10-CM | POA: Diagnosis not present

## 2017-03-22 DIAGNOSIS — F32A Depression, unspecified: Secondary | ICD-10-CM

## 2017-03-22 DIAGNOSIS — N401 Enlarged prostate with lower urinary tract symptoms: Secondary | ICD-10-CM

## 2017-03-22 LAB — COMPREHENSIVE METABOLIC PANEL
ALBUMIN: 3.2 g/dL — AB (ref 3.5–5.0)
ALT: 53 U/L (ref 17–63)
ANION GAP: 9 (ref 5–15)
AST: 34 U/L (ref 15–41)
Alkaline Phosphatase: 90 U/L (ref 38–126)
BUN: 14 mg/dL (ref 6–20)
CHLORIDE: 114 mmol/L — AB (ref 101–111)
CO2: 18 mmol/L — AB (ref 22–32)
Calcium: 8.1 mg/dL — ABNORMAL LOW (ref 8.9–10.3)
Creatinine, Ser: 0.95 mg/dL (ref 0.61–1.24)
GFR calc non Af Amer: >60 mL/min (ref >60)
GLUCOSE: 77 mg/dL (ref 65–99)
Potassium: 2.7 mmol/L — CL (ref 3.5–5.1)
SODIUM: 141 mmol/L (ref 135–145)
Total Bilirubin: 2.5 mg/dL — ABNORMAL HIGH (ref 0.3–1.2)
Total Protein: 5.2 g/dL — ABNORMAL LOW (ref 6.5–8.1)

## 2017-03-22 LAB — LACTIC ACID, PLASMA: LACTIC ACID, VENOUS: 1.9 mmol/L (ref 0.5–1.9)

## 2017-03-22 LAB — BASIC METABOLIC PANEL
ANION GAP: 10 (ref 5–15)
BUN: 14 mg/dL (ref 6–20)
CO2: 17 mmol/L — ABNORMAL LOW (ref 22–32)
Calcium: 8.7 mg/dL — ABNORMAL LOW (ref 8.9–10.3)
Chloride: 110 mmol/L (ref 101–111)
Creatinine, Ser: 1.11 mg/dL (ref 0.61–1.24)
GFR calc Af Amer: >60 mL/min (ref >60)
Glucose, Bld: 88 mg/dL (ref 65–99)
POTASSIUM: 3.2 mmol/L — AB (ref 3.5–5.1)
SODIUM: 137 mmol/L (ref 135–145)

## 2017-03-22 LAB — CBC WITH DIFFERENTIAL/PLATELET
BASOS PCT: 1 %
Basophils Absolute: 0.1 10*3/uL (ref 0–0.1)
EOS ABS: 0 10*3/uL (ref 0–0.7)
EOS PCT: 1 %
HCT: 41.5 % (ref 40.0–52.0)
Hemoglobin: 15.1 g/dL (ref 13.0–18.0)
LYMPHS ABS: 2.9 10*3/uL (ref 1.0–3.6)
Lymphocytes Relative: 33 %
MCH: 32 pg (ref 26.0–34.0)
MCHC: 36.3 g/dL — AB (ref 32.0–36.0)
MCV: 88.2 fL (ref 80.0–100.0)
Monocytes Absolute: 0.8 10*3/uL (ref 0.2–1.0)
Monocytes Relative: 9 %
Neutro Abs: 5 10*3/uL (ref 1.4–6.5)
Neutrophils Relative %: 56 %
PLATELETS: 162 10*3/uL (ref 150–440)
RBC: 4.71 MIL/uL (ref 4.40–5.90)
RDW: 14.3 % (ref 11.5–14.5)
WBC: 8.9 10*3/uL (ref 3.8–10.6)

## 2017-03-22 LAB — MAGNESIUM: Magnesium: 1.6 mg/dL — ABNORMAL LOW (ref 1.7–2.4)

## 2017-03-22 MED ORDER — POTASSIUM CHLORIDE CRYS ER 20 MEQ PO TBCR
40.0000 meq | EXTENDED_RELEASE_TABLET | Freq: Once | ORAL | Status: AC
  Administered 2017-03-22: 40 meq via ORAL
  Filled 2017-03-22: qty 2

## 2017-03-22 MED ORDER — TRAZODONE HCL 100 MG PO TABS: 100.0000 mg | ORAL_TABLET | Freq: Every evening | ORAL | Status: DC | PRN

## 2017-03-22 MED ORDER — SODIUM CHLORIDE 0.9 % IV BOLUS (SEPSIS)
1000.0000 mL | Freq: Once | INTRAVENOUS | Status: AC
  Administered 2017-03-22: 1000 mL via INTRAVENOUS

## 2017-03-22 MED ORDER — OLANZAPINE 5 MG PO TABS
5.0000 mg | ORAL_TABLET | Freq: Every day | ORAL | Status: DC
  Administered 2017-03-22 – 2017-04-05 (×15): 5 mg via ORAL
  Filled 2017-03-22 (×15): qty 1

## 2017-03-22 MED ORDER — ALUM & MAG HYDROXIDE-SIMETH 200-200-20 MG/5ML PO SUSP: 30.0000 mL | ORAL | Status: DC | PRN

## 2017-03-22 MED ORDER — FLUVOXAMINE MALEATE 50 MG PO TABS
100.0000 mg | ORAL_TABLET | Freq: Every day | ORAL | Status: DC
  Administered 2017-03-22 – 2017-03-23 (×2): 100 mg via ORAL
  Filled 2017-03-22 (×3): qty 2

## 2017-03-22 MED ORDER — ACETAMINOPHEN 325 MG PO TABS: 650.0000 mg | ORAL_TABLET | Freq: Four times a day (QID) | ORAL | Status: DC | PRN

## 2017-03-22 MED ORDER — MAGNESIUM HYDROXIDE 400 MG/5ML PO SUSP: 30.0000 mL | Freq: Every day | ORAL | Status: DC | PRN

## 2017-03-22 MED ORDER — SODIUM CHLORIDE 0.9 % IV SOLN
250.0000 mL | Freq: Once | INTRAVENOUS | Status: AC
Start: 2017-03-22 — End: 2017-03-24
  Administered 2017-03-24: 11:00:00 via INTRAVENOUS

## 2017-03-22 MED ORDER — TAMSULOSIN HCL 0.4 MG PO CAPS
0.4000 mg | ORAL_CAPSULE | Freq: Two times a day (BID) | ORAL | Status: DC
  Administered 2017-03-23 – 2017-04-06 (×26): 0.4 mg via ORAL
  Filled 2017-03-22 (×26): qty 1

## 2017-03-22 MED ORDER — POTASSIUM CHLORIDE 20 MEQ PO PACK
PACK | ORAL | Status: AC
  Filled 2017-03-22: qty 2

## 2017-03-22 MED ORDER — MAGNESIUM SULFATE 2 GM/50ML IV SOLN
2.0000 g | Freq: Once | INTRAVENOUS | Status: AC
  Administered 2017-03-22: 2 g via INTRAVENOUS
  Filled 2017-03-22: qty 50

## 2017-03-22 NOTE — ED Triage Notes (Addendum)
Pt arrives via EMS from home, son called EMS, pt has had decreased PO intake for multiple days, states hx of depression and states he has not been eating due to being depressed, states he canceled his ECT appt last week, denies any pain at present, denies SI,  awake and alert

## 2017-03-22 NOTE — BH Assessment (Signed)
[name].  Attending Physician will be [name].   Patient [facility] 310[facility] LesterPhyllis.   ER staff is aware of the admission (Dr. Roxan Hockeyobinson, ER MD; Jae DireKate, Patient's Nurse & Clydie BraunKaren, Patient Access).

## 2017-03-22 NOTE — Progress Notes (Signed)
Patient admitted for failure to thrive and is on olanzapine 5 mg and trazodone 50 mg. Patient's EKG on 03/22/17 showed a QTc of 636 msec.  Recommended to hold off on trazodone until QTc can stabilize. MD notified and agrees with plan.  Thomasene Rippleavid Judine Arciniega, PharmD, BCPS Clinical Pharmacist 03/22/2017

## 2017-03-22 NOTE — BH Assessment (Signed)
Assessment Note  Dillon [name] is an 66 y.o. male who presents to the ER via his son, due to him having concerns for his current mental and emotional state. Patient missed his recent ECT appointment and he has stop taking his medications. Since then, the family have witnessed a rapid decline. He has stop eating and drinking water. "He sits in the same chair all day." He's personal hygiene has declined as well. He's not bathing, brushing his teeth nor is he shaving. Per the report of the patient's son, he's been fearful of not having enough money to pay his bills, which isn't true. Son states, he and his sister Jahmal Dunavant), patient's daughter, handle his financial affairs and he have more than enough to take care of his bills. "His house is paid for and he can afford to do whatever he wants. He's just scared, he may break something or he don't have enough money." Thus, patient sit in the dark all day. He recently contacted someone to get a mortgage on the house as a way to receive income but son intervened and stopped it. Son's states, he don't flush the toilet after using it because he believes it's going to "run the water bill up." Patient refuses to turn on the air conditioner because of the same fear. Patient's electric bill was $4. When family turn things on, patient turn them off when they leave.  During the interview, patient was calm, cooperative, and pleasant and spoke softly. When answering questions, son interrupted and stated he was minimizing his symptoms. Patient kept stating he was well and had no concerns.  Per report of the son, patient depression increased, following the death of his wife. Patient was married for approximately forty-four years. She died approximately two years ago. When patient start talking about her, he became tearful and it was difficult for him to talk. Majority of the information for this assessment was provided by the patient's son.  Son Judie Grieve  Stotler-[phone]) Daughter Tresa Endo Asay-[phone])  Diagnosis: Depression  Past Medical History:  Past Medical History:  Diagnosis Date  . Kidney stones   . Meningitis    spinal  . OCD (obsessive compulsive disorder)     Past Surgical History:  Procedure Laterality Date  . KIDNEY STONE SURGERY      Family History:  Family History  Problem Relation Age of Onset  . Cancer Mother        bladder  . Emphysema Father   . Emphysema Sister   . COPD Sister   . Cancer Brother        prostate    Social History:  reports that he has never smoked. He has never used smokeless tobacco. He reports that he does not drink alcohol or use drugs.  Additional Social History:  Alcohol / Drug Use Pain Medications: See PTA Prescriptions: See PTA Over the Counter: See PTA History of alcohol / drug use?: No history of alcohol / drug abuse Longest period of sobriety (when/how long): n/a Negative Consequences of Use:  (n/a) Withdrawal Symptoms:  (n/a)  CIWA: CIWA-Ar BP: 124/87 Pulse Rate: 78 COWS:    Allergies:  Allergies  Allergen Reactions  . Codeine Other (See Comments)    Unsure of reaction    Home Medications:  (Not in a hospital admission)  OB/GYN Status:  No LMP for male patient.  General Assessment Data Location of Assessment: [facility] ED TTS Assessment: In system Is this a Tele or Face-to-Face Assessment?: Face-to-Face Is this an  Initial Assessment or a Re-assessment for this encounter?: Initial Assessment Marital status: Widowed [place] name: n/a Is patient pregnant?: No Pregnancy Status: No Living Arrangements: Alone Can pt return to current living arrangement?: Yes Admission Status: Voluntary Is patient capable of signing voluntary admission?: Yes Referral Source: Self/Family/Friend Insurance type: BCBS  Medical Screening Exam [facility] Walk-in ONLY) Medical Exam completed: Yes  Crisis Care Plan Living Arrangements: Alone Legal Guardian: Other: (Self) Name of  Psychiatrist: Dr. Toni Amend Name of Therapist: n/a  Education Status Is patient currently in school?: No Current Grade: n/a Highest grade of school patient has completed: n/a Name of school: n/a Contact person: n/a  Risk to self with the past 6 months Suicidal Ideation: No Has patient been a risk to self within the past 6 months prior to admission? : No Suicidal Intent: No Has patient had any suicidal intent within the past 6 months prior to admission? : No Is patient at risk for suicide?: No Suicidal Plan?: No Has patient had any suicidal plan within the past 6 months prior to admission? : No Access to Means: No What has been your use of drugs/alcohol within the last 12 months?: Reports of none Previous Attempts/Gestures: No How many times?: 0 Other Self Harm Risks: Failure to thrive Triggers for Past Attempts: Spouse contact, Anniversary Intentional Self Injurious Behavior:  (Self-Care) Family Suicide History: No Recent stressful life event(s): Loss (Comment), Other (Comment) (Wife passed away two years ago) Persecutory voices/beliefs?: No Depression: Yes Depression Symptoms: Despondent, Insomnia, Tearfulness, Isolating, Fatigue, Loss of interest in usual pleasures, Feeling worthless/self pity Substance abuse history and/or treatment for substance abuse?: No Suicide prevention information given to non-admitted patients: Not applicable  Risk to Others within the past 6 months Homicidal Ideation: No Does patient have any lifetime risk of violence toward others beyond the six months prior to admission? : No Thoughts of Harm to Others: No Current Homicidal Intent: No Current Homicidal Plan: No Access to Homicidal Means: No Identified Victim: Reports of none History of harm to others?: No Assessment of Violence: None Noted Violent Behavior Description: Reports of none Does patient have access to weapons?: No Criminal Charges Pending?: No Does patient have a court date: No Is  patient on probation?: No  Psychosis Hallucinations: None noted Delusions: None noted  Mental Status Report Appearance/Hygiene: Unremarkable, In hospital gown Eye Contact: Fair Motor Activity: Unable to assess (Patient was laying in the bed) Speech: Logical/coherent, Unremarkable Level of Consciousness: Alert Mood: Depressed, Anxious, Helpless, Despair, Empty, Sad, Sullen, Pleasant Affect: Anxious, Depressed, Sad Anxiety Level: Minimal Thought Processes: Coherent, Relevant Judgement: Unimpaired Orientation: Person, Place, Time, Situation, Appropriate for developmental age Obsessive Compulsive Thoughts/Behaviors: None  Cognitive Functioning Concentration: Normal Memory: Recent Intact, Remote Intact IQ: Average Insight: Fair Impulse Control: Poor Appetite: Poor Weight Loss: 70 (Within two months) Weight Gain: 0 Sleep: Decreased Total Hours of Sleep: 3 Vegetative Symptoms: Staying in bed, Not bathing, Decreased grooming  ADLScreening [facility] Assessment Services) Patient's cognitive ability adequate to safely complete daily activities?: Yes Patient able to express need for assistance with ADLs?: Yes Independently performs ADLs?: Yes (appropriate for developmental age)  Prior Inpatient Therapy Prior Inpatient Therapy: Yes Prior Therapy Dates: 12/2015 & 11/2015 Prior Therapy Facilty/Provider(s): [facility] Reason for Treatment: Depression  Prior Outpatient Therapy Prior Outpatient Therapy: Yes Prior Therapy Dates: Current Prior Therapy Facilty/Provider(s): [facility]  Reason for Treatment: Depression Does patient have an ACCT team?: No Does patient have Intensive In-House Services?  : No Does patient have [place] services? :  No Does patient have P4CC services?: No  ADL Screening (condition at time of admission) Patient's cognitive ability adequate to safely complete daily activities?: Yes Is the patient deaf or have difficulty hearing?: No Does the  patient have difficulty seeing, even when wearing glasses/contacts?: No Does the patient have difficulty concentrating, remembering, or making decisions?: No Patient able to express need for assistance with ADLs?: Yes Does the patient have difficulty dressing or bathing?: No Independently performs ADLs?: Yes (appropriate for developmental age) Does the patient have difficulty walking or climbing stairs?: No Weakness of Legs: None Weakness of Arms/Hands: None  Home Assistive Devices/Equipment Home Assistive Devices/Equipment: None  Therapy Consults (therapy consults require a physician order) PT Evaluation Needed: No OT Evalulation Needed: No SLP Evaluation Needed: No Abuse/Neglect Assessment (Assessment to be complete while patient is alone) Physical Abuse: Denies Verbal Abuse: Denies Sexual Abuse: Denies Exploitation of patient/patient's resources: Denies Self-Neglect: Denies Values / Beliefs Cultural Requests During Hospitalization: None Spiritual Requests During Hospitalization: None Consults Spiritual Care Consult Needed: No Social Work Consult Needed: No      Additional Information 1:1 In Past 12 Months?: No CIRT Risk: No Elopement Risk: No Does patient have medical clearance?: Yes  Child/Adolescent Assessment Running Away Risk: Denies (Patient is an adult)  Disposition:  Disposition Initial Assessment Completed for this Encounter: Yes Disposition of Patient: Other dispositions (ER MD Ordered Psych Consult)  On Site Evaluation by:   Reviewed with Physician:    Lilyan Gilfordalvin J. Royann Wildasin MS, LCAS, LPC, NCC, CCSI Therapeutic Triage Specialist 03/22/2017 5:21 PM

## 2017-03-22 NOTE — ED Notes (Signed)
TTS at bedside. 

## 2017-03-22 NOTE — ED Notes (Signed)
Pt given a cup of water 

## 2017-03-22 NOTE — ED Notes (Signed)
Son at bedside.

## 2017-03-22 NOTE — ED Provider Notes (Signed)
[facility] Emergency Department Provider Note    None    (approximate)  I have reviewed the triage vital signs and the nursing notes.   HISTORY  Chief Complaint Failure To Thrive and Depression    HPI Dillon [name] is a 66 y.o. male history of OCD presents from home via EMS with report of decreased by mouth intake and failure to thrive. Patient states he also has a history of depression and has not been eating due to generalized anhedonia. Denies any active plan to commit suicide but no lower wants to live. Denies any pain. No nausea or vomiting. Has not been taking his medications. He was was to have our CT done 2 weeks ago but missed viscous he did not want to go.   Past Medical History:  Diagnosis Date  . Kidney stones   . Meningitis    spinal  . OCD (obsessive compulsive disorder)    Family History  Problem Relation Age of Onset  . Cancer Mother        bladder  . Emphysema Father   . Emphysema Sister   . COPD Sister   . Cancer Brother        prostate   Past Surgical History:  Procedure Laterality Date  . KIDNEY STONE SURGERY     Patient Active Problem List   Diagnosis Date Noted  . Hospital discharge follow-up 02/03/2016  . Suicidal ideation 01/04/2016  . Severe recurrent major depression without psychotic features (HCC) 12/15/2015  . Grief 12/15/2015  . Weight loss 12/15/2015  . OCD (obsessive compulsive disorder) 12/15/2015  . Benign prostatic hyperplasia with urinary obstruction 11/24/2015  . 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 Authorizing Provider  fluvoxaMINE (LUVOX) 100 MG tablet TAKE ONE TABLET BY MOUTH AT BEDTIME 07/07/16   Clapacs, Jackquline Denmark, MD  OLANZapine zydis (ZYPREXA) 5 MG disintegrating tablet Take 1 tablet (5 mg total) by mouth at bedtime. 01/29/16   Clapacs, Jackquline Denmark, MD  tamsulosin (FLOMAX) 0.4 MG CAPS capsule Take 1 capsule  (0.4 mg total) by mouth daily after supper. Patient taking differently: Take 0.4 mg by mouth 2 (two) times daily.  01/29/16   Clapacs, Jackquline Denmark, MD  traZODone (DESYREL) 50 MG tablet Take 1 tablet (50 mg total) by mouth at bedtime as needed for sleep. 01/29/16   Clapacs, Jackquline Denmark, MD    Allergies Codeine    Social History Social History  Substance Use Topics  . Smoking status: Never Smoker  . Smokeless tobacco: Never Used  . Alcohol use No    Review of Systems Patient denies headaches, rhinorrhea, blurry vision, numbness, shortness of breath, chest pain, edema, cough, abdominal pain, nausea, vomiting, diarrhea, dysuria, fevers, rashes or hallucinations unless otherwise stated above in HPI. ____________________________________________   PHYSICAL EXAM:  VITAL SIGNS: Vitals:   03/22/17 1439  BP: 134/84  Pulse: 71  Resp: 18  Temp: 97.8 F (36.6 C)    Constitutional: Alert and oriented. Frail appearing but in no acute distress. Eyes: Conjunctivae are normal.  Head: Atraumatic. Nose: No congestion/rhinnorhea. Mouth/Throat: Mucous membranes are moist.   Neck: No stridor. Painless ROM.  Cardiovascular: Normal rate, regular rhythm. Grossly normal heart sounds.  Good peripheral circulation. Respiratory: Normal respiratory effort.  No retractions. Lungs CTAB. Gastrointestinal: Soft and nontender. No distention. No abdominal bruits. No CVA tenderness. Musculoskeletal: No lower extremity tenderness nor edema.  No  joint effusions. Neurologic:  Normal speech and language. No gross focal neurologic deficits are appreciated. No facial droop Skin:  Skin is warm, dry and intact. No rash noted. Psychiatric: withdrawn, blunted affect ____________________________________________   LABS (all labs ordered are listed, but only abnormal results are displayed)  Results for orders placed or performed during the hospital encounter of 03/22/17 (from the past 24 hour(s))  CBC with  Differential/Platelet     Status: Abnormal   Collection Time: 03/22/17  2:44 PM  Result Value Ref Range   WBC 8.9 3.8 - 10.6 K/uL   RBC 4.71 4.40 - 5.90 MIL/uL   Hemoglobin 15.1 13.0 - 18.0 g/dL   HCT 16.1 09.6 - 04.5 %   MCV 88.2 80.0 - 100.0 fL   MCH 32.0 26.0 - 34.0 pg   MCHC 36.3 (H) 32.0 - 36.0 g/dL   RDW 40.9 81.1 - 91.4 %   Platelets 162 150 - 440 K/uL   Neutrophils Relative % 56 %   Neutro Abs 5.0 1.4 - 6.5 K/uL   Lymphocytes Relative 33 %   Lymphs Abs 2.9 1.0 - 3.6 K/uL   Monocytes Relative 9 %   Monocytes Absolute 0.8 0.2 - 1.0 K/uL   Eosinophils Relative 1 %   Eosinophils Absolute 0.0 0 - 0.7 K/uL   Basophils Relative 1 %   Basophils Absolute 0.1 0 - 0.1 K/uL  Comprehensive metabolic panel     Status: Abnormal   Collection Time: 03/22/17  2:44 PM  Result Value Ref Range   Sodium 141 135 - 145 mmol/L   Potassium 2.7 (LL) 3.5 - 5.1 mmol/L   Chloride 114 (H) 101 - 111 mmol/L   CO2 18 (L) 22 - 32 mmol/L   Glucose, Bld 77 65 - 99 mg/dL   BUN 14 6 - 20 mg/dL   Creatinine, Ser 7.82 0.61 - 1.24 mg/dL   Calcium 8.1 (L) 8.9 - 10.3 mg/dL   Total Protein 5.2 (L) 6.5 - 8.1 g/dL   Albumin 3.2 (L) 3.5 - 5.0 g/dL   AST 34 15 - 41 U/L   ALT 53 17 - 63 U/L   Alkaline Phosphatase 90 38 - 126 U/L   Total Bilirubin 2.5 (H) 0.3 - 1.2 mg/dL   GFR calc non Af Amer >60 >60 mL/min   GFR calc Af Amer >60 >60 mL/min   Anion gap 9 5 - 15  Magnesium     Status: Abnormal   Collection Time: 03/22/17  2:44 PM  Result Value Ref Range   Magnesium 1.6 (L) 1.7 - 2.4 mg/dL  Lactic acid, plasma     Status: None   Collection Time: 03/22/17  3:50 PM  Result Value Ref Range   Lactic Acid, Venous 1.9 0.5 - 1.9 mmol/L  Basic metabolic panel     Status: Abnormal   Collection Time: 03/22/17  6:24 PM  Result Value Ref Range   Sodium 137 135 - 145 mmol/L   Potassium 3.2 (L) 3.5 - 5.1 mmol/L   Chloride 110 101 - 111 mmol/L   CO2 17 (L) 22 - 32 mmol/L   Glucose, Bld 88 65 - 99 mg/dL   BUN 14 6 -  20 mg/dL   Creatinine, Ser 9.56 0.61 - 1.24 mg/dL   Calcium 8.7 (L) 8.9 - 10.3 mg/dL   GFR calc non Af Amer >60 >60 mL/min   GFR calc Af Amer >60 >60 mL/min   Anion gap 10 5 - 15   ____________________________________________  EKG My review and personal interpretation at Time: 15:15   Indication: ftt  Rate: 75  Rhythm: sinus Axis: normal Other: prolonged qt, no STEMI ____________________________________________  RADIOLOGY  I personally reviewed all radiographic images ordered to evaluate for the above acute complaints and reviewed radiology reports and findings.  These findings were personally discussed with the patient.  Please see medical record for radiology report.  ____________________________________________   PROCEDURES  Procedure(s) performed:  Procedures    Critical Care performed: no ____________________________________________   INITIAL IMPRESSION / ASSESSMENT AND PLAN / ED COURSE  Pertinent labs & imaging results that were available during my care of the patient were reviewed by me and considered in my medical decision making (see chart for details).  DDX: Psychosis, delirium, medication effect, noncompliance, polysubstance abuse, Si, Hi, depression   Richardo PriestRobert H Ryce is a 66 y.o. who presents to the ED with for evaluation of failure to thrive as well as depression and decreased by mouth intake for the past several days. He is afebrile and otherwise hemodynamically stable. Does appear to be severely depressed. Patient has evidence of hyper-chloremia metabolic acidosis secondary to decreased by mouth intake. We'll give IV fluids. Will replete his potassium and give him IV magnesium. Will consult psychiatry is a do feel that he would benefit from psychiatric admission based on his severity of depression.  Clinical Course as of Mar 22 2334  Wed Mar 22, 2017  1957 Patient eating.  BMP improved.  I expect bicarb to continue to improve with additional PO intake.   PAtient medically clear for psychiatric admission.  [PR]    Clinical Course User Index [PR] Willy Eddyobinson, Tylah Mancillas, MD     ____________________________________________   FINAL CLINICAL IMPRESSION(S) / ED DIAGNOSES  Final diagnoses:  Depression, unspecified depression type  Hyperchloremic metabolic acidosis  Hypokalemia      NEW MEDICATIONS STARTED DURING THIS VISIT:  New Prescriptions   No medications on file     Note:  This document was prepared using Dragon voice recognition software and may include unintentional dictation errors.    Willy Eddyobinson, Verina Galeno, MD 03/22/17 2337

## 2017-03-22 NOTE — ED Notes (Signed)
Pt aware of pending admission, resting in bed

## 2017-03-22 NOTE — ED Notes (Signed)
Dr.Robinson notified of critical K-dur of 2.7

## 2017-03-22 NOTE — Consult Note (Signed)
[facility] Face-to-Face Psychiatry Consult   Reason for Consult:  Severe depression and OCd. Medication noncomplience. Sig weight loss. Not eating well. No energy. Constant worry. Thoughts of suicide.  Referring Physician: Quentin Cornwall Patient Identification: JEREMIAS BROYHILL MRN:  [id number] Principal Diagnosis: Recurrent major depression-severe [facility]) Diagnosis:   Patient Active Problem List   Diagnosis Date Noted  . Recurrent major depression-severe ([place]) [F33.2] 03/22/2017  . Hospital discharge follow-up [Z09] 02/03/2016  . Suicidal ideation [R45.851] 01/04/2016  . Severe recurrent major depression without psychotic features ([place]) [F33.2] 12/15/2015  . Grief [F43.20] 12/15/2015  . Weight loss [R63.4] 12/15/2015  . OCD (obsessive compulsive disorder) [F42.9] 12/15/2015  . Benign prostatic hyperplasia with urinary obstruction [N40.1, N13.8] 11/24/2015  . 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 male patient admitted with :Im not doing well:Marland Kitchen  HPI:  Several months of progressive depression and anxiety. Poor po intake. Refusing medication. Negative thinking. Little activity. Suicidal thoughts  Past Psychiatric History: Hx of severe OCD and depression and positive prior response to ECT. Past hx suicide attempts  Risk to Self:   Risk to Others:   Prior Inpatient Therapy:   Prior Outpatient Therapy:    Past Medical History:  Past Medical History:  Diagnosis Date  . Kidney stones   . Meningitis    spinal  . OCD (obsessive compulsive disorder)     Past Surgical History:  Procedure Laterality Date  . KIDNEY STONE SURGERY     Family History:  Family History  Problem Relation Age of Onset  . Cancer Mother        bladder  . Emphysema Father   . Emphysema Sister   . COPD Sister   . Cancer Brother        prostate   Family Psychiatric  History: depression Social History:  History  Alcohol  Use No     History  Drug Use No    Social History   Social History  . Marital status: Widowed    Spouse name: N/A  . Number of children: N/A  . Years of education: N/A   Social History Main Topics  . Smoking status: Never Smoker  . Smokeless tobacco: Never Used  . Alcohol use No  . Drug use: No  . Sexual activity: Not Currently   Other Topics Concern  . Not on file   Social History Narrative  . No narrative on file   Additional Social History:    Allergies:   Allergies  Allergen Reactions  . Codeine Other (See Comments)    Unsure of reaction    Labs:  Results for orders placed or performed during the hospital encounter of 03/22/17 (from the past 48 hour(s))  CBC with Differential/Platelet     Status: Abnormal   Collection Time: 03/22/17  2:44 PM  Result Value Ref Range   WBC 8.9 3.8 - 10.6 K/uL   RBC 4.71 4.40 - 5.90 MIL/uL   Hemoglobin 15.1 13.0 - 18.0 g/dL    Comment: RESULT REPEATED AND VERIFIED   HCT 41.5 40.0 - 52.0 %   MCV 88.2 80.0 - 100.0 fL   MCH 32.0 26.0 - 34.0 pg   MCHC 36.3 (H) 32.0 - 36.0 g/dL   RDW 14.3 11.5 - 14.5 %   Platelets 162 150 - 440 K/uL   Neutrophils Relative % 56 %   Neutro Abs 5.0 1.4 - 6.5  K/uL   Lymphocytes Relative 33 %   Lymphs Abs 2.9 1.0 - 3.6 K/uL   Monocytes Relative 9 %   Monocytes Absolute 0.8 0.2 - 1.0 K/uL   Eosinophils Relative 1 %   Eosinophils Absolute 0.0 0 - 0.7 K/uL   Basophils Relative 1 %   Basophils Absolute 0.1 0 - 0.1 K/uL  Comprehensive metabolic panel     Status: Abnormal   Collection Time: 03/22/17  2:44 PM  Result Value Ref Range   Sodium 141 135 - 145 mmol/L   Potassium 2.7 (LL) 3.5 - 5.1 mmol/L    Comment: CRITICAL RESULT CALLED TO, READ BACK BY AND VERIFIED WITH OLIVIA BROOMER ON 03/22/17 AT 1532 QSD    Chloride 114 (H) 101 - 111 mmol/L   CO2 18 (L) 22 - 32 mmol/L   Glucose, Bld 77 65 - 99 mg/dL   BUN 14 6 - 20 mg/dL   Creatinine, Ser 0.95 0.61 - 1.24 mg/dL   Calcium 8.1 (L) 8.9 - 10.3  mg/dL   Total Protein 5.2 (L) 6.5 - 8.1 g/dL   Albumin 3.2 (L) 3.5 - 5.0 g/dL   AST 34 15 - 41 U/L   ALT 53 17 - 63 U/L   Alkaline Phosphatase 90 38 - 126 U/L   Total Bilirubin 2.5 (H) 0.3 - 1.2 mg/dL   GFR calc non Af Amer >60 >60 mL/min   GFR calc Af Amer >60 >60 mL/min    Comment: (NOTE) The eGFR has been calculated using the CKD EPI equation. This calculation has not been validated in all clinical situations. eGFR's persistently <60 mL/min signify possible Chronic Kidney Disease.    Anion gap 9 5 - 15  Magnesium     Status: Abnormal   Collection Time: 03/22/17  2:44 PM  Result Value Ref Range   Magnesium 1.6 (L) 1.7 - 2.4 mg/dL  Lactic acid, plasma     Status: None   Collection Time: 03/22/17  3:50 PM  Result Value Ref Range   Lactic Acid, Venous 1.9 0.5 - 1.9 mmol/L  Basic metabolic panel     Status: Abnormal   Collection Time: 03/22/17  6:24 PM  Result Value Ref Range   Sodium 137 135 - 145 mmol/L   Potassium 3.2 (L) 3.5 - 5.1 mmol/L   Chloride 110 101 - 111 mmol/L   CO2 17 (L) 22 - 32 mmol/L   Glucose, Bld 88 65 - 99 mg/dL   BUN 14 6 - 20 mg/dL   Creatinine, Ser 1.11 0.61 - 1.24 mg/dL   Calcium 8.7 (L) 8.9 - 10.3 mg/dL   GFR calc non Af Amer >60 >60 mL/min   GFR calc Af Amer >60 >60 mL/min    Comment: (NOTE) The eGFR has been calculated using the CKD EPI equation. This calculation has not been validated in all clinical situations. eGFR's persistently <60 mL/min signify possible Chronic Kidney Disease.    Anion gap 10 5 - 15    Current Facility-Administered Medications  Medication Dose Route Frequency Provider Last Rate Last Dose  . acetaminophen (TYLENOL) tablet 650 mg  650 mg Oral Q6H PRN Elray Dains T, MD      . alum & mag hydroxide-simeth (MAALOX/MYLANTA) 200-200-20 MG/5ML suspension 30 mL  30 mL Oral Q4H PRN Shaquetta Arcos T, MD      . fluvoxaMINE (LUVOX) tablet 100 mg  100 mg Oral QHS Issac Moure T, MD      . magnesium hydroxide (MILK OF MAGNESIA)  suspension 30 mL  30 mL Oral Daily PRN Luie Laneve T, MD      . OLANZapine (ZYPREXA) tablet 5 mg  5 mg Oral QHS Cassity Christian T, MD      . Derrill Memo ON 03/23/2017] tamsulosin (FLOMAX) capsule 0.4 mg  0.4 mg Oral QPC supper Sascha Palma T, MD      . traZODone (DESYREL) tablet 100 mg  100 mg Oral QHS PRN Marton Malizia, Madie Reno, MD        Musculoskeletal: Strength & Muscle Tone: within normal limits Gait & Station: normal Patient leans: N/A  Psychiatric Specialty Exam: Physical Exam  Nursing note and vitals reviewed. Constitutional: He appears well-developed and well-nourished.  HENT:  Head: Normocephalic and atraumatic.  Eyes: Conjunctivae are normal. Pupils are equal, round, and reactive to light.  Neck: Normal range of motion.  Cardiovascular: Regular rhythm and normal heart sounds.   Respiratory: Effort normal. No respiratory distress.  GI: Soft.  Musculoskeletal: Normal range of motion.  Neurological: He is alert.  Skin: Skin is warm and dry.  Psychiatric: His mood appears anxious. His speech is delayed. He is slowed and withdrawn. He expresses inappropriate judgment. He exhibits a depressed mood. He expresses suicidal ideation. He expresses no suicidal plans. He exhibits abnormal recent memory.    Review of Systems  Constitutional: Positive for weight loss.  HENT: Negative.   Eyes: Negative.   Respiratory: Negative.   Cardiovascular: Negative.   Gastrointestinal: Negative.   Musculoskeletal: Negative.   Skin: Negative.   Neurological: Positive for weakness.  Psychiatric/Behavioral: Positive for depression, memory loss and suicidal ideas. Negative for hallucinations and substance abuse. The patient is nervous/anxious and has insomnia.     There were no vitals taken for this visit.There is no height or weight on file to calculate BMI.  General Appearance: Disheveled  Eye Contact:  Fair  Speech:  Slow  Volume:  Decreased  Mood:  Depressed  Affect:  Blunt and Depressed  Thought  Process:  Linear  Orientation:  Full (Time, Place, and Person)  Thought Content:  Illogical, Rumination and Tangential  Suicidal Thoughts:  Yes.  without intent/plan  Homicidal Thoughts:  No  Memory:  Immediate;   Good Recent;   Fair Remote;   Fair  Judgement:  Impaired  Insight:  Shallow  Psychomotor Activity:  Decreased  Concentration:  Concentration: Fair  Recall:  [employer] of Knowledge:  Fair  Language:  Fair  Akathisia:  No  Handed:  Right  AIMS (if indicated):     Assets:  Financial Resources/Insurance Housing Physical Health Social Support  ADL's:  Impaired  Cognition:  Impaired,  Mild  Sleep:        Treatment Plan Summary: Daily contact with patient to assess and evaluate symptoms and progress in treatment, Medication management and Plan Admit to [place]. Restart medication. 15 min checks. Labs. Begin discussion to ECT  Disposition: Recommend psychiatric Inpatient admission when medically cleared. Supportive therapy provided about ongoing stressors.  Alethia Berthold, MD 03/22/2017 10:39 PM

## 2017-03-23 ENCOUNTER — Other Ambulatory Visit: Payer: Self-pay | Admitting: Psychiatry

## 2017-03-23 LAB — BASIC METABOLIC PANEL
Anion gap: 11 (ref 5–15)
BUN: 11 mg/dL (ref 6–20)
CALCIUM: 9.7 mg/dL (ref 8.9–10.3)
CO2: 23 mmol/L (ref 22–32)
CREATININE: 1.04 mg/dL (ref 0.61–1.24)
Chloride: 107 mmol/L (ref 101–111)
GFR calc Af Amer: >60 mL/min (ref >60)
GLUCOSE: 98 mg/dL (ref 65–99)
Potassium: 3.6 mmol/L (ref 3.5–5.1)
Sodium: 141 mmol/L (ref 135–145)

## 2017-03-23 NOTE — Plan of Care (Signed)
Problem: Activity: Goal: Sleeping patterns will improve Outcome: Progressing Patient slept for Estimated Hours of 4.45; q15 minutes safety round maintained, no injury or falls during this shift.    

## 2017-03-23 NOTE — [facility] Suicide Risk Assessment (Signed)
[facility] Admission Suicide Risk Assessment   Nursing information obtained from:   review of nursing notes. Demographic factors:   white male. Lives alone. Still mourning the loss of his wife. Little activity. Does have family support from his children. Current Mental Status:   patient is awake and alert. Affect and mood are flat. Passive morbid thoughts without any suicidal intent. Low energy. No evidence psychosis. Loss Factors:   patient's wife died over a year ago and he is still in pretty deep grieving over it. Historical Factors:   history of long-standing OCD and more recent severe depression with previous hospitalization Risk Reduction Factors:   history of previous response to medication and ECT treatment. Good physical health. Support from adult children who live close by  Total Time spent with patient: 1 hour Principal Problem: Recurrent major depression-severe ([facility]) Diagnosis:   Patient Active Problem List   Diagnosis Date Noted  . Recurrent major depression-severe ([facility]) [F33.2] 03/22/2017  . Hospital discharge follow-up [Z09] 02/03/2016  . Suicidal ideation [R45.851] 01/04/2016  . Severe recurrent major depression without psychotic features ([facility]) [F33.2] 12/15/2015  . Grief [F43.20] 12/15/2015  . Weight loss [R63.4] 12/15/2015  . OCD (obsessive compulsive disorder) [F42.9] 12/15/2015  . Benign prostatic hyperplasia with urinary obstruction [N40.1, N13.8] 11/24/2015  . 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 Patient has been getting more depressed for months now. His appetite has almost vanished and he has lost weight and is not eating well. Mood and energy level are poor. Feels down and hopeless much of the time. Not however reporting any psychosis. Has had passive suicidal thoughts without any intent or plan of acting on them. No homicidal ideation.  Continued Clinical Symptoms:   Alcohol Use Disorder Identification Test Final Score (AUDIT): 0 The "Alcohol Use Disorders Identification Test", Guidelines for Use in Primary Care, Second Edition.  World Science writer [facility]). Score between 0-7:  no or low risk or alcohol related problems. Score between 8-15:  moderate risk of alcohol related problems. Score between 16-19:  high risk of alcohol related problems. Score 20 or above:  warrants further diagnostic evaluation for alcohol dependence and treatment.   CLINICAL FACTORS:   Depression:   Anhedonia Hopelessness Obsessive-Compulsive Disorder   Musculoskeletal: Strength & Muscle Tone: decreased Gait & Station: normal Patient leans: N/A  Psychiatric Specialty Exam: Physical Exam  Nursing note and vitals reviewed. Constitutional: He appears well-developed.  HENT:  Head: Normocephalic and atraumatic.  Eyes: Conjunctivae are normal. Pupils are equal, round, and reactive to light.  Neck: Normal range of motion.  Cardiovascular: Regular rhythm and normal heart sounds.   Respiratory: Effort normal. No respiratory distress.  GI: Soft.  Musculoskeletal: Normal range of motion.  Neurological: He is alert.  Skin: Skin is warm and dry.  Psychiatric: Judgment normal. His affect is blunt. His speech is delayed. He is slowed and withdrawn. Cognition and memory are normal. He exhibits a depressed mood. He expresses suicidal ideation. He expresses no suicidal plans.    Review of Systems  Constitutional: Positive for malaise/fatigue and weight loss.  HENT: Negative.   Eyes: Negative.   Respiratory: Negative.   Cardiovascular: Negative.   Gastrointestinal: Negative.   Musculoskeletal: Negative.   Skin: Negative.   Neurological: Positive for dizziness and weakness.  Psychiatric/Behavioral: Positive for depression. Negative for hallucinations, memory loss, substance abuse and suicidal ideas. The patient is nervous/anxious and has insomnia.  Blood pressure  128/82, pulse 78, temperature 97.9 F (36.6 C), resp. rate 16, height 5\' 9"  (1.753 m), weight 82.1 kg (181 lb), SpO2 99 %.Body mass index is 26.73 kg/m.  General Appearance: Disheveled  Eye Contact:  Fair  Speech:  Slow  Volume:  Decreased  Mood:  Anxious and Depressed  Affect:  Congruent  Thought Process:  Goal Directed  Orientation:  Full (Time, Place, and Person)  Thought Content:  Logical, Rumination and Tangential  Suicidal Thoughts:  Yes.  without intent/plan  Homicidal Thoughts:  No  Memory:  Immediate;   Good Recent;   Fair Remote;   Fair  Judgement:  Fair  Insight:  Fair  Psychomotor Activity:  Decreased  Concentration:  Concentration: Fair  Recall:  [employer]  Fund of Knowledge:  Fair  Language:  Fair  Akathisia:  No  Handed:  Right  AIMS (if indicated):     Assets:  Communication Skills Desire for Improvement Financial Resources/Insurance Housing Physical Health Resilience Social Support  ADL's:  Intact  Cognition:  Impaired,  Mild  Sleep:  Number of Hours: 4.45      COGNITIVE FEATURES THAT CONTRIBUTE TO RISK:  Polarized thinking    SUICIDE RISK:   Moderate:  Frequent suicidal ideation with limited intensity, and duration, some specificity in terms of plans, no associated intent, good self-control, limited dysphoria/symptomatology, some risk factors present, and identifiable protective factors, including available and accessible social support.  PLAN OF CARE: This is a 66 year old man with severe recurrent depression. Not eating well. Health is suffering from his depression. Has been very resistant to treatment. Patient was admitted to the hospital with the intention of starting ECT treatment and medication treatment to improve symptoms and quality of life. Reassess suicidal ideation continuously before discharge  I certify that inpatient services furnished can reasonably be expected to improve the patient's condition.   Mordecai RasmussenJohn Darrio Bade, MD 03/23/2017, 4:36 PM

## 2017-03-23 NOTE — Progress Notes (Signed)
Pt withdrawn to room with depressed mood. PT denies current SI, HI, a/v hallucinations. Commits to safety on unit. He was compliant with am dose of medications. Pt is calm and cooperative. Will continue to monitor for safety.

## 2017-03-23 NOTE — H&P (Signed)
Psychiatric Admission Assessment Adult  Patient Identification: Dillon [name] MRN:  [id number] Date of Evaluation:  03/23/2017 Chief Complaint:  depression Principal Diagnosis: Recurrent major depression-severe ([place]) Diagnosis:   Patient Active Problem List   Diagnosis Date Noted  . Recurrent major depression-severe ([place]) [F33.2] 03/22/2017  . Hospital discharge follow-up [Z09] 02/03/2016  . Suicidal ideation [R45.851] 01/04/2016  . Severe recurrent major depression without psychotic features ([place]) [F33.2] 12/15/2015  . Grief [F43.20] 12/15/2015  . Weight loss [R63.4] 12/15/2015  . OCD (obsessive compulsive disorder) [F42.9] 12/15/2015  . Benign prostatic hyperplasia with urinary obstruction [N40.1, N13.8] 11/24/2015  . 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 Symptoms worsening for months now. Patient has been off his medicine and resisting all treatment. Patient does have a past history of suicidality. Currently he has lost weight and has evidence of electrolyte abnormalities from his poor nutrition. History of response to ECT positively. History of previous hospitalizations. He is admitted to the hospital through the emergency room for resumption of medication treatment as well as ECT for his symptoms Associated Signs/Symptoms: Depression Symptoms:  depressed mood, anhedonia, insomnia, psychomotor retardation, difficulty concentrating, hopelessness, recurrent thoughts of death, anxiety, loss of energy/fatigue, disturbed sleep, weight loss, decreased appetite, (Hypo) Manic Symptoms:  None Anxiety Symptoms:  Excessive Worry, Psychotic Symptoms:  None PTSD Symptoms: Negative Total Time spent with patient: 1 hour  Past Psychiatric History: Patient has a past history of psychiatric hospitalizations and a past history of suicide attempts. He had responded well to medications  in the past but had clearly gotten much benefit from a previous course of ECT. Patient had not been compliant with recommended maintenance ECT and more recently has not been compliant with recommended medication management. He also has a history of OCD long-standing. It has been much more a problem since he has retired and particularly since his wife passed away.  Is the patient at risk to self? Yes.    Has the patient been a risk to self in the past 6 months? Yes.    Has the patient been a risk to self within the distant past? Yes.    Is the patient a risk to others? No.  Has the patient been a risk to others in the past 6 months? No.  Has the patient been a risk to others within the distant past? No.   Prior Inpatient Therapy:   Prior Outpatient Therapy:    Alcohol Screening: 1. How often do you have a drink containing alcohol?: Never 2. How many drinks containing alcohol do you have on a typical day when you are drinking?: 1 or 2 3. How often do you have six or more drinks on one occasion?: Never Preliminary Score: 0 4. How often during the last year have you found that you were not able to stop drinking once you had started?: Never 5. How often during the last year have you failed to do what was normally expected from you becasue of drinking?: Never 6. How often during the last year have you needed a first drink in the morning to get yourself going after a heavy drinking session?: Never 7. How often during the last year have you had a feeling of guilt of remorse after drinking?: Never 8. How often during the last year have you been unable to remember what happened the night before because you had been drinking?: Never 9. Have you or someone  else been injured as a result of your drinking?: No 10. Has a relative or friend or a doctor or another health worker been concerned about your drinking or suggested you cut down?: No Alcohol Use Disorder Identification Test Final Score (AUDIT):  0 Brief Intervention: AUDIT score less than 7 or less-screening does not suggest unhealthy drinking-brief intervention not indicated Substance Abuse History in the last 12 months:  No. Consequences of Substance Abuse: Negative Previous Psychotropic Medications: Yes  Psychological Evaluations: Yes  Past Medical History:  Past Medical History:  Diagnosis Date  . Kidney stones   . Meningitis    spinal  . OCD (obsessive compulsive disorder)     Past Surgical History:  Procedure Laterality Date  . KIDNEY STONE SURGERY     Family History:  Family History  Problem Relation Age of Onset  . Cancer Mother        bladder  . Emphysema Father   . Emphysema Sister   . COPD Sister   . Cancer Brother        prostate   Family Psychiatric  History: History of anxiety and depression Tobacco Screening: Have you used any form of tobacco in the last 30 days? (Cigarettes, Smokeless Tobacco, Cigars, and/or Pipes): No Social History:  History  Alcohol Use No     History  Drug Use No    Additional Social History: Marital status: Widowed Widowed, when?: 2016 Are you sexually active?: No What is your sexual orientation?: heterosexual Has your sexual activity been affected by drugs, alcohol, medication, or emotional stress?: n/a Does patient have children?: Yes How many children?: 2 How is patient's relationship with their children?: Patient has a good relationship with is children.                          Allergies:   Allergies  Allergen Reactions  . Codeine Other (See Comments)    Unsure of reaction   Lab Results:  Results for orders placed or performed during the hospital encounter of 03/22/17 (from the past 48 hour(s))  CBC with Differential/Platelet     Status: Abnormal   Collection Time: 03/22/17  2:44 PM  Result Value Ref Range   WBC 8.9 3.8 - 10.6 K/uL   RBC 4.71 4.40 - 5.90 MIL/uL   Hemoglobin 15.1 13.0 - 18.0 g/dL    Comment: RESULT REPEATED AND VERIFIED   HCT  41.5 40.0 - 52.0 %   MCV 88.2 80.0 - 100.0 fL   MCH 32.0 26.0 - 34.0 pg   MCHC 36.3 (H) 32.0 - 36.0 g/dL   RDW 14.3 11.5 - 14.5 %   Platelets 162 150 - 440 K/uL   Neutrophils Relative % 56 %   Neutro Abs 5.0 1.4 - 6.5 K/uL   Lymphocytes Relative 33 %   Lymphs Abs 2.9 1.0 - 3.6 K/uL   Monocytes Relative 9 %   Monocytes Absolute 0.8 0.2 - 1.0 K/uL   Eosinophils Relative 1 %   Eosinophils Absolute 0.0 0 - 0.7 K/uL   Basophils Relative 1 %   Basophils Absolute 0.1 0 - 0.1 K/uL  Comprehensive metabolic panel     Status: Abnormal   Collection Time: 03/22/17  2:44 PM  Result Value Ref Range   Sodium 141 135 - 145 mmol/L   Potassium 2.7 (LL) 3.5 - 5.1 mmol/L    Comment: CRITICAL RESULT CALLED TO, READ BACK BY AND VERIFIED WITH OLIVIA BROOMER ON 03/22/17 AT 1532  QSD    Chloride 114 (H) 101 - 111 mmol/L   CO2 18 (L) 22 - 32 mmol/L   Glucose, Bld 77 65 - 99 mg/dL   BUN 14 6 - 20 mg/dL   Creatinine, Ser 0.95 0.61 - 1.24 mg/dL   Calcium 8.1 (L) 8.9 - 10.3 mg/dL   Total Protein 5.2 (L) 6.5 - 8.1 g/dL   Albumin 3.2 (L) 3.5 - 5.0 g/dL   AST 34 15 - 41 U/L   ALT 53 17 - 63 U/L   Alkaline Phosphatase 90 38 - 126 U/L   Total Bilirubin 2.5 (H) 0.3 - 1.2 mg/dL   GFR calc non Af Amer >60 >60 mL/min   GFR calc Af Amer >60 >60 mL/min    Comment: (NOTE) The eGFR has been calculated using the CKD EPI equation. This calculation has not been validated in all clinical situations. eGFR's persistently <60 mL/min signify possible Chronic Kidney Disease.    Anion gap 9 5 - 15  Magnesium     Status: Abnormal   Collection Time: 03/22/17  2:44 PM  Result Value Ref Range   Magnesium 1.6 (L) 1.7 - 2.4 mg/dL  Lactic acid, plasma     Status: None   Collection Time: 03/22/17  3:50 PM  Result Value Ref Range   Lactic Acid, Venous 1.9 0.5 - 1.9 mmol/L  Basic metabolic panel     Status: Abnormal   Collection Time: 03/22/17  6:24 PM  Result Value Ref Range   Sodium 137 135 - 145 mmol/L   Potassium 3.2 (L)  3.5 - 5.1 mmol/L   Chloride 110 101 - 111 mmol/L   CO2 17 (L) 22 - 32 mmol/L   Glucose, Bld 88 65 - 99 mg/dL   BUN 14 6 - 20 mg/dL   Creatinine, Ser 1.11 0.61 - 1.24 mg/dL   Calcium 8.7 (L) 8.9 - 10.3 mg/dL   GFR calc non Af Amer >60 >60 mL/min   GFR calc Af Amer >60 >60 mL/min    Comment: (NOTE) The eGFR has been calculated using the CKD EPI equation. This calculation has not been validated in all clinical situations. eGFR's persistently <60 mL/min signify possible Chronic Kidney Disease.    Anion gap 10 5 - 15    Blood Alcohol level:  Lab Results  Component Value Date   ETH <5 01/04/2016   ETH <5 91/79/1505    Metabolic Disorder Labs:  Lab Results  Component Value Date   HGBA1C 5.0 01/05/2016   Lab Results  Component Value Date   PROLACTIN 29.7 (H) 01/05/2016   Lab Results  Component Value Date   CHOL 140 01/05/2016   TRIG 90 01/05/2016   HDL 43 01/05/2016   CHOLHDL 3.3 01/05/2016   VLDL 18 01/05/2016   LDLCALC 79 01/05/2016    Current Medications: Current Facility-Administered Medications  Medication Dose Route Frequency Provider Last Rate Last Dose  . 0.9 %  sodium chloride infusion  250 mL Intravenous Once Thomas Mabry T, MD      . acetaminophen (TYLENOL) tablet 650 mg  650 mg Oral Q6H PRN Kharter Sestak T, MD      . alum & mag hydroxide-simeth (MAALOX/MYLANTA) 200-200-20 MG/5ML suspension 30 mL  30 mL Oral Q4H PRN Aayushi Solorzano T, MD      . fluvoxaMINE (LUVOX) tablet 100 mg  100 mg Oral QHS Day Deery T, MD   100 mg at 03/22/17 2308  . magnesium hydroxide (MILK OF MAGNESIA) suspension 30  mL  30 mL Oral Daily PRN Graviela Nodal T, MD      . OLANZapine (ZYPREXA) tablet 5 mg  5 mg Oral QHS Sahiti Joswick T, MD   5 mg at 03/22/17 2300  . tamsulosin (FLOMAX) capsule 0.4 mg  0.4 mg Oral BID AC Deasia Chiu T, MD   0.4 mg at 03/23/17 0626   PTA Medications: Prescriptions Prior to Admission  Medication Sig Dispense Refill Last Dose  . fluvoxaMINE (LUVOX) 100  MG tablet TAKE ONE TABLET BY MOUTH AT BEDTIME 30 tablet 0   . OLANZapine zydis (ZYPREXA) 5 MG disintegrating tablet Take 1 tablet (5 mg total) by mouth at bedtime. 30 tablet 1 Taking  . tamsulosin (FLOMAX) 0.4 MG CAPS capsule Take 1 capsule (0.4 mg total) by mouth daily after supper. (Patient taking differently: Take 0.4 mg by mouth 2 (two) times daily. ) 30 capsule 1 Taking  . traZODone (DESYREL) 50 MG tablet Take 1 tablet (50 mg total) by mouth at bedtime as needed for sleep. 30 tablet 1 Taking    Musculoskeletal: Strength & Muscle Tone: within normal limits Gait & Station: normal Patient leans: N/A  Psychiatric Specialty Exam: Physical Exam  Nursing note and vitals reviewed. Constitutional: He appears well-developed.  HENT:  Head: Normocephalic and atraumatic.  Eyes: Conjunctivae are normal. Pupils are equal, round, and reactive to light.  Neck: Normal range of motion.  Cardiovascular: Regular rhythm and normal heart sounds.   Respiratory: Effort normal. No respiratory distress.  GI: Soft.  Musculoskeletal: Normal range of motion.  Neurological: He is alert.  Skin: Skin is warm and dry.  Psychiatric: Judgment normal. His mood appears anxious. His affect is blunt. His speech is delayed. He is slowed. Cognition and memory are normal. He exhibits a depressed mood. He expresses suicidal ideation. He expresses no suicidal plans.    Review of Systems  Constitutional: Positive for malaise/fatigue and weight loss.  HENT: Negative.   Eyes: Negative.   Respiratory: Negative.   Cardiovascular: Negative.   Gastrointestinal: Negative.   Musculoskeletal: Negative.   Skin: Negative.   Neurological: Positive for weakness.  Psychiatric/Behavioral: Positive for depression. Negative for hallucinations, memory loss, substance abuse and suicidal ideas. The patient is nervous/anxious and has insomnia.     Blood pressure 128/82, pulse 78, temperature 97.9 F (36.6 C), resp. rate 16, height 5' 9"   (1.753 m), weight 82.1 kg (181 lb), SpO2 99 %.Body mass index is 26.73 kg/m.  General Appearance: Disheveled  Eye Contact:  Fair  Speech:  Slow  Volume:  Decreased  Mood:  Anxious and Depressed  Affect:  Congruent  Thought Process:  Goal Directed  Orientation:  Full (Time, Place, and Person)  Thought Content:  Illogical and Rumination  Suicidal Thoughts:  Yes.  without intent/plan  Homicidal Thoughts:  No  Memory:  Immediate;   Good Recent;   Fair Remote;   Fair  Judgement:  Fair  Insight:  Fair  Psychomotor Activity:  Decreased  Concentration:  Concentration: Fair  Recall:  [employer] of Knowledge:  Fair  Language:  Fair  Akathisia:  No  Handed:  Right  AIMS (if indicated):     Assets:  Desire for Improvement  ADL's:  Intact  Cognition:  Impaired,  Mild  Sleep:  Number of Hours: 4.45    Treatment Plan Summary: Daily contact with patient to assess and evaluate symptoms and progress in treatment, Medication management and Plan Patient admitted to the psychiatric service. 15 minute checks in place.  As far as labs I am going to recheck his basic metabolic today to make sure his potassium is staying stable. We will get a urine analysis but otherwise I think his labs are okay for proceeding to ECT tomorrow. He is on the schedule. For now continue antidepressants and antipsychotics. Supportive counseling and review of treatment plan. Case reviewed with nursing. Also discussed with them some of the orthostatic dizziness he hasn't night. They are aware of this and are watching out. No other change to medication for today.  Observation Level/Precautions:  15 minute checks  Laboratory:  Chemistry Profile  Psychotherapy:    Medications:    Consultations:    Discharge Concerns:    Estimated LOS:  Other:     Physician Treatment Plan for Primary Diagnosis: Recurrent major depression-severe ([place]) Long Term Goal(s): Improvement in symptoms so as ready for discharge  Short Term Goals:  Compliance with prescribed medications will improve and Ability to identify triggers associated with substance abuse/mental health issues will improve  Physician Treatment Plan for Secondary Diagnosis: Principal Problem:   Recurrent major depression-severe ([place]) Active Problems:   Benign prostatic hyperplasia with urinary obstruction   OCD (obsessive compulsive disorder)  Long Term Goal(s): Improvement in symptoms so as ready for discharge  Short Term Goals: Ability to disclose and discuss suicidal ideas and Ability to demonstrate self-control will improve  I certify that inpatient services furnished can reasonably be expected to improve the patient's condition.    Alethia Berthold, MD 6/7/20184:42 PM

## 2017-03-23 NOTE — Tx Team (Signed)
Initial Treatment Plan 03/23/2017 2:15 AM Dillon Priestobert H Colclasure RUE:[id number]    PATIENT STRESSORS: Loss of wife of 44 years approximately 2 years ago Medication change or noncompliance   PATIENT STRENGTHS: Average or above average intelligence Capable of independent living Supportive family/friends   PATIENT IDENTIFIED PROBLEMS: Mood instability  Treatments and Medication Noncompliance - missed his recent ECT  Obsessive Compulsive Thoughts  Self Neglect               DISCHARGE CRITERIA:  Improved stabilization in mood, thinking, and/or behavior Motivation to continue treatment in a less acute level of care Safe-care adequate arrangements made Verbal commitment to aftercare and medication compliance  PRELIMINARY DISCHARGE PLAN: Outpatient therapy Return to previous living arrangement  PATIENT/FAMILY INVOLVEMENT: This treatment plan has been presented to and reviewed with the patient, Dillon [name].  The patient and family have been given the opportunity to ask questions and make suggestions.  Cleotis NipperAbiodun T Liyat Faulkenberry, RN 03/23/2017, 2:15 AM

## 2017-03-23 NOTE — [facility] Group Notes (Signed)
[facility] LCSW Group Therapy Note  Type of Therapy and Topic:  Group Therapy:  Goals Group: SMART Goals  Participation Level:  Patient attended group on this date and minimally participated in the group discussion.   Description of Group:   The purpose of a daily goals group is to assist and guide patients in setting recovery/wellness-related goals.  The objective is to set goals as they relate to the crisis in which they were admitted. Patients will be using SMART goal modalities to set measurable goals.  Characteristics of realistic goals will be discussed and patients will be assisted in setting and processing how one will reach their goal. Facilitator will also assist patients in applying interventions and coping skills learned in psycho-education groups to the SMART goal and process how one will achieve defined goal.  Therapeutic Goals: -Patients will develop and document one goal related to or their crisis in which brought them into treatment. -Patients will be guided by LCSW using SMART goal setting modality in how to set a measurable, attainable, realistic and time sensitive goal.  -Patients will process barriers in reaching goal. -Patients will process interventions in how to overcome and successful in reaching goal.   Summary of Patient Progress:  Patient Goal: "I want to improve my sleeping habits".    Therapeutic Modalities:   Motivational Interviewing Engineer, manufacturing systemsCognitive Behavioral Therapy Crisis Intervention Model SMART goals setting  Dillon [name] G. Garnette CzechSampson MSW, [facility]WA 03/23/2017 10:48 AM

## 2017-03-23 NOTE — Progress Notes (Signed)
Patient ID: Dillon PriestRobert H [name], male   DOB: October 06, 1951, 66 y.o.   MRN: [id number] [facility]; BIB his son; admitted now for depression, self neglect, obsessive compulsive thoughts, treatments and medication noncompliance. Patient missed his recent ECT appointment and has stop taking his medications. Calm, pleasant but flat, sad, depressed in mood and affect. Patient placed on moderate fall precautions, room closer to the nurses' station for close and frequent monitor. Skin assessment and contraband search completed, gross skin intact with a lot of hair; no contrabands found on patient and in his belongings; home medications sent to pharmacy. Medications given as ordered, unit and room orientation completed.
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
## 2017-03-23 NOTE — Plan of Care (Signed)
Problem: Safety: Goal: Ability to remain free from injury will improve Outcome: Progressing Pt remains safe during hospital stay    

## 2017-03-23 NOTE — Progress Notes (Signed)
Recreation Therapy Notes  Date: 06.07.18 Time: 9:30 am Location: Craft Room  Group Topic: Leisure Education  Goal Area(s) Addresses:  Patient will identify things they are grateful for. Patient will identify how being grateful can influence decision making.  Behavioral Response: Did not attend  Intervention: Grateful Wheel  Activity: Patients were given an I Am Grateful For worksheet and were instructed to write things they are grateful for under each category.   Education: LRT educated patients on leisure.  Education Outcome: Patient did not attend group.  Clinical Observations/Feedback: Patient did not attend group.  Jacquelynn CreeGreene,Elsy Chiang M, LRT/CTRS 03/23/2017 10:05 AM

## 2017-03-23 NOTE — [facility] Group Notes (Signed)
[facility] LCSW Group Therapy  03/23/2017 2:09 PM  Type of Therapy:  Group Therapy  Participation Level:  None  Participation Quality:  Drowsy and Inattentive  Affect:  Lethargic  Cognitive:  Lacking  Insight:  None  Engagement in Therapy:  None  Modes of Intervention:  Activity, Discussion, Education, Problem-solving, Dance movement psychotherapisteality Testing, Socialization and Support  Summary of Progress/Problems: Balance in life: Patients will discuss the concept of balance and how it looks and feels to be unbalanced. Pt will identify areas in their life that is unbalanced and ways to become more balanced. They discussed what aspects in their lives has influenced their self care. Patients also discussed self care in the areas of self regulation/control, hygiene/appearance, sleep/relaxation, healthy leisure, healthy eating habits, exercise, inner peace/spirituality, self improvement, sobriety, and health management. They were challenged to identify changes that are needed in order to improve self care. Patient left group after about 10 minutes stating he was not feeling well. Patient did not return to group.   Dillon [name] G. Garnette CzechSampson MSW, LCSWA 03/23/2017, 2:12 PM

## 2017-03-24 ENCOUNTER — Inpatient Hospital Stay: Payer: BLUE CROSS/BLUE SHIELD | Admitting: Anesthesiology

## 2017-03-24 ENCOUNTER — Encounter: Payer: Self-pay | Admitting: *Deleted

## 2017-03-24 LAB — GLUCOSE, CAPILLARY: GLUCOSE-CAPILLARY: 93 mg/dL (ref 65–99)

## 2017-03-24 MED ORDER — LABETALOL HCL 5 MG/ML IV SOLN
INTRAVENOUS | Status: DC | PRN
  Administered 2017-03-24: 20 mg via INTRAVENOUS

## 2017-03-24 MED ORDER — FLUVOXAMINE MALEATE 50 MG PO TABS
200.0000 mg | ORAL_TABLET | Freq: Every day | ORAL | Status: DC
  Administered 2017-03-24 – 2017-04-05 (×13): 200 mg via ORAL
  Filled 2017-03-24 (×17): qty 4

## 2017-03-24 MED ORDER — SODIUM CHLORIDE 0.9 % IV SOLN
500.0000 mL | Freq: Once | INTRAVENOUS | Status: AC
  Administered 2017-03-24: 1000 mL via INTRAVENOUS

## 2017-03-24 MED ORDER — METHOHEXITAL SODIUM 100 MG/10ML IV SOSY
PREFILLED_SYRINGE | INTRAVENOUS | Status: DC | PRN
  Administered 2017-03-24: 70 mg via INTRAVENOUS

## 2017-03-24 MED ORDER — SUCCINYLCHOLINE CHLORIDE 20 MG/ML IJ SOLN
INTRAMUSCULAR | Status: DC | PRN
  Administered 2017-03-24: 80 mg via INTRAVENOUS

## 2017-03-24 NOTE — Progress Notes (Signed)
Patient back from ECT .Lunch offered,very poor intake.Patient very depressed.States "I don't feel like doing anything".Denies suicidal or homicidal ideations and AV hallucinations.Encouraged fluids.Patient states "I am scared if I will eat I will mess up with my stomach".Support & encouragement given.Did not attend groups.

## 2017-03-24 NOTE — Progress Notes (Addendum)
D: Pt denies SI/HI/AVH. Pt is pleasant and cooperative. Pt stated he was tired, pt was encouraged to stay in the bed, and asked to call for assistance when ambulating, pt was told not to ambulate without staff present. Pt still resistant to eating / drinking even though encouragement provided .   A: Pt was offered support and encouragement. Pt was given scheduled medications. Pt was encourage to attend groups. Q 15 minute checks were done for safety.   R: safety maintained on unit. 

## 2017-03-24 NOTE — Procedures (Signed)
ECT SERVICES Physician's Interval Evaluation & Treatment Note  Patient Identification: Dillon PriestRobert H Wojciak MRN:  [id number] Date of Evaluation:  03/24/2017 TX #: 1  MADRS: 30  MMSE: 29  P.E. Findings:  Patient's heart and lung exam is normal vitals unremarkable no remarkable physical findings.  Psychiatric Interval Note:  Patient is subjectively depressed down passive suicidal thoughts anxious obsessive  Subjective:  Patient is a 66 y.o. male seen for evaluation for Electroconvulsive Therapy. Depressed  Treatment Summary:   [x]   Right Unilateral             []  Bilateral   % Energy : 0.3 ms 70%   Impedance: 1640 ohms  Seizure Energy Index: No reading but it was a clearly powerful seizure  Postictal Suppression Index: No reading but it seemed good to maintain  Seizure Concordance Index: 71%  Medications  Pre Shock: Toradol 30 mg Brevital 70 mg succinylcholine 80 mg  Post Shock:    Seizure Duration: 18 seconds by EMG 67 seconds by EEG   Comments: Follow-up Monday Wednesday and Friday next week   Lungs:  [x]   Clear to auscultation               []  Other:   Heart:    [x]   Regular rhythm             []  irregular rhythm    [x]   Previous H&P reviewed, patient examined and there are NO CHANGES                 []   Previous H&P reviewed, patient examined and there are changes noted.   Mordecai RasmussenJohn Sheriann Newmann, MD 6/8/201811:18 AM

## 2017-03-24 NOTE — Anesthesia Post-op Follow-up Note (Cosign Needed)
Anesthesia QCDR form completed.        

## 2017-03-24 NOTE — Progress Notes (Signed)
[facility] MD Progress Note  03/24/2017 6:42 PM Dillon [name]  MRN:  [id number] Subjective:  66 year old man with recurrent severe depression and obsessive-compulsive disorder. Symptoms have been getting much worse over the last couple months as he has refused medication. He was admitted to the hospital for his refusal of medication, not eating or drinking, passive suicidal thoughts. Patient had ECT today right unilateral treatment tolerated well. He is continuing to be prescribed medication consistent with what he took in the past for OCD and depression. Major acute problem is that he continues to refuse food and drink. He gives the excuse that he just doesn't feel hungry or thirsty. Nursing is very concerned about this. He is getting more dizzy when he gets up and walks. He has been educated about the dangers of orthostasis. I'm concerned that he may be having some obsessive or psychotic sort of thoughts about it. In fact he tells me that he has the idea that if he eats or drinks anything he will immediately have an explosive bowel movement even though there is no evidence for this. Principal Problem: Recurrent major depression-severe ([place]) Diagnosis:   Patient Active Problem List   Diagnosis Date Noted  . Recurrent major depression-severe ([name]) [F33.2] 03/22/2017  . Hospital discharge follow-up [Z09] 02/03/2016  . Suicidal ideation [R45.851] 01/04/2016  . Severe recurrent major depression without psychotic features ([name]) [F33.2] 12/15/2015  . Grief [F43.20] 12/15/2015  . Weight loss [R63.4] 12/15/2015  . OCD (obsessive compulsive disorder) [F42.9] 12/15/2015  . Benign prostatic hyperplasia with urinary obstruction [N40.1, N13.8] 11/24/2015  . 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 Long-standing OCD  Past Medical History:  Past Medical  History:  Diagnosis Date  . Kidney stones   . Meningitis    spinal  . OCD (obsessive compulsive disorder)     Past Surgical History:  Procedure Laterality Date  . KIDNEY STONE SURGERY     Family History:  Family History  Problem Relation Age of Onset  . Cancer Mother        bladder  . Emphysema Father   . Emphysema Sister   . COPD Sister   . Cancer Brother        prostate   Family Psychiatric  History: Positive for anxiety and depression Social History:  History  Alcohol Use No     History  Drug Use No    Social History   Social History  . Marital status: Widowed    Spouse name: N/A  . Number of children: N/A  . Years of education: N/A   Social History Main Topics  . Smoking status: Never Smoker  . Smokeless tobacco: Never Used  . Alcohol use No  . Drug use: No  . Sexual activity: Not Currently   Other Topics Concern  . None   Social History Narrative  . None   Additional Social History:                         Sleep: Fair  Appetite:  Poor  Current Medications: Current Facility-Administered Medications  Medication Dose Route Frequency Provider Last Rate Last Dose  . acetaminophen (TYLENOL) tablet 650 mg  650 mg Oral Q6H PRN Kanye Depree T, MD      . alum & mag hydroxide-simeth (MAALOX/MYLANTA) 200-200-20 MG/5ML suspension 30 mL  30  mL Oral Q4H PRN ,  T, MD      . fluvoxaMINE (LUVOX) tablet 100 mg  100 mg Oral QHS ,  T, MD   100 mg at 03/23/17 2155  . magnesium hydroxide (MILK OF MAGNESIA) suspension 30 mL  30 mL Oral Daily PRN ,  T, MD      . OLANZapine (ZYPREXA) tablet 5 mg  5 mg Oral QHS ,  T, MD   5 mg at 03/23/17 2155  . tamsulosin (FLOMAX) capsule 0.4 mg  0.4 mg Oral BID AC ,  T, MD   0.4 mg at 03/24/17 1656    Lab Results:  Results for orders placed or performed during the hospital encounter of 03/22/17 (from the past 48 hour(s))  Basic metabolic panel     Status: None    Collection Time: 03/23/17  4:45 PM  Result Value Ref Range   Sodium 141 135 - 145 mmol/L   Potassium 3.6 3.5 - 5.1 mmol/L   Chloride 107 101 - 111 mmol/L   CO2 23 22 - 32 mmol/L   Glucose, Bld 98 65 - 99 mg/dL   BUN 11 6 - 20 mg/dL   Creatinine, Ser 1.04 0.61 - 1.24 mg/dL   Calcium 9.7 8.9 - 10.3 mg/dL   GFR calc non Af Amer >60 >60 mL/min   GFR calc Af Amer >60 >60 mL/min    Comment: (NOTE) The eGFR has been calculated using the CKD EPI equation. This calculation has not been validated in all clinical situations. eGFR's persistently <60 mL/min signify possible Chronic Kidney Disease.    Anion gap 11 5 - 15  Glucose, capillary     Status: None   Collection Time: 03/24/17  6:31 AM  Result Value Ref Range   Glucose-Capillary 93 65 - 99 mg/dL   Comment 1 Notify RN     Blood Alcohol level:  Lab Results  Component Value Date   ETH <5 01/04/2016   ETH <5 12/15/2015    Metabolic Disorder Labs: Lab Results  Component Value Date   HGBA1C 5.0 01/05/2016   Lab Results  Component Value Date   PROLACTIN 29.7 (H) 01/05/2016   Lab Results  Component Value Date   CHOL 140 01/05/2016   TRIG 90 01/05/2016   HDL 43 01/05/2016   CHOLHDL 3.3 01/05/2016   VLDL 18 01/05/2016   LDLCALC 79 01/05/2016    Physical Findings: AIMS:  , ,  ,  ,    CIWA:    COWS:     Musculoskeletal: Strength & Muscle Tone: within normal limits Gait & Station: normal Patient leans: N/A  Psychiatric Specialty Exam: Physical Exam  Nursing note and vitals reviewed. Constitutional: He appears well-developed and well-nourished.  HENT:  Head: Normocephalic and atraumatic.  Eyes: Conjunctivae are normal. Pupils are equal, round, and reactive to light.  Neck: Normal range of motion.  Cardiovascular: Regular rhythm and normal heart sounds.   Respiratory: Effort normal. No respiratory distress.  GI: Soft.  Musculoskeletal: Normal range of motion.  Neurological: He is alert.  Skin: Skin is warm and  dry.  Psychiatric: His mood appears anxious. His affect is blunt. His speech is delayed. He is slowed and withdrawn. Cognition and memory are impaired. He expresses impulsivity. He exhibits a depressed mood. He expresses suicidal ideation. He expresses no suicidal plans. He exhibits abnormal recent memory.    Review of Systems  Constitutional: Positive for malaise/fatigue and weight loss.  HENT: Negative.   Eyes: Negative.     Respiratory: Negative.   Cardiovascular: Negative.   Gastrointestinal: Negative.   Musculoskeletal: Negative.   Skin: Negative.   Neurological: Negative.   Psychiatric/Behavioral: Positive for depression, memory loss and suicidal ideas. Negative for hallucinations and substance abuse. The patient is nervous/anxious and has insomnia.     Blood pressure 129/87, pulse 78, temperature 98.4 F (36.9 C), temperature source Oral, resp. rate 18, height 5' 9" (1.753 m), weight 80.7 kg (178 lb), SpO2 96 %.Body mass index is 26.29 kg/m.  General Appearance: Casual  Eye Contact:  Fair  Speech:  Slow  Volume:  Decreased  Mood:  Depressed  Affect:  Congruent  Thought Process:  Disorganized  Orientation:  Full (Time, Place, and Person)  Thought Content:  Illogical and Rumination  Suicidal Thoughts:  Yes.  without intent/plan  Homicidal Thoughts:  No  Memory:  Immediate;   Good Recent;   Fair Remote;   Fair  Judgement:  Impaired  Insight:  Shallow  Psychomotor Activity:  Decreased  Concentration:  Concentration: Fair  Recall:  Fair  Fund of Knowledge:  Fair  Language:  Fair  Akathisia:  No  Handed:  Right  AIMS (if indicated):     Assets:  Desire for Improvement Housing Resilience Social Support  ADL's:  Impaired  Cognition:  Impaired,  Mild  Sleep:  Number of Hours: 7     Treatment Plan Summary: Daily contact with patient to assess and evaluate symptoms and progress in treatment, Medication management and Plan Patient who is continuing to be depressed  obsessed and not eating well. He is agreeable to ECT and we will continue treatment into next week. No change today to medication. He was counseled at length about the dangers of not eating and drinking and I tried to coach him through some ways that he can make sure he is getting oral intake. Nursing is aware of this and will keep an eye on him in case he becomes a falls risk.   , MD 03/24/2017, 6:42 PM 

## 2017-03-24 NOTE — [facility] Group Notes (Signed)
[facility] Group Notes:  (Nursing/MHT/Case Management/Adjunct)  Date:  03/24/2017  Time:  12:34 AM  Type of Therapy:  Group Therapy  Participation Level:  Did Not Attend  Participation QualitySummary of Progress/Problems:  Mayra NeerJackie L Kaitelyn Jamison 03/24/2017, 12:34 AM

## 2017-03-24 NOTE — Progress Notes (Signed)
D: Pt isolative and withdrawn displays a flat affect.  Pt is scheduled for ECT this am.  Pt NPO after mid night. Pt medication compliant.  Denies current s/i, h/i or hallucinations. Appears disheveled in hygiene.  Responses are appropriate but delayed.  A: Pt given HS meds, reminded to be NPO after mid night. Monitored on 15 minute safety checks and has maintained safety on the unit.  R: Appears depressed, scheduled for ECT in am.  Continue tx plan

## 2017-03-24 NOTE — Anesthesia Preprocedure Evaluation (Signed)
Anesthesia Evaluation  Patient identified by MRN, date of birth, ID band Patient awake    Reviewed: Allergy & Precautions, NPO status , Patient's Chart, lab work & pertinent test results  Airway Mallampati: II  TM Distance: >3 FB Neck ROM: Full    Dental  (+) Poor Dentition, Chipped, Loose, Missing   Pulmonary    Pulmonary exam normal        Cardiovascular Exercise Tolerance: Poor hypertension, Pt. on home beta blockers and Pt. on medications Normal cardiovascular exam     Neuro/Psych Depression    GI/Hepatic   Endo/Other    Renal/GU Renal disease (stones)Hx of stones.     Musculoskeletal   Abdominal Normal abdominal exam  (+)   Peds  Hematology   Anesthesia Other Findings   Reproductive/Obstetrics                             Anesthesia Physical  Anesthesia Plan  ASA: III  Anesthesia Plan: General   Post-op Pain Management:    Induction: Intravenous  PONV Risk Score and Plan:   Airway Management Planned: Mask  Additional Equipment:   Intra-op Plan:   Post-operative Plan:   Informed Consent: I have reviewed the patients History and Physical, chart, labs and discussed the procedure including the risks, benefits and alternatives for the proposed anesthesia with the patient or authorized representative who has indicated his/her understanding and acceptance.     Plan Discussed with: CRNA  Anesthesia Plan Comments:         Anesthesia Quick Evaluation

## 2017-03-24 NOTE — Transfer of Care (Signed)
Immediate Anesthesia Transfer of Care Note  Patient: Dillon [name]  Procedure(s) Performed: ECT   Patient Location: PACU  Anesthesia Type:General  Level of Consciousness: sedated  Airway & Oxygen Therapy: Patient connected to face mask oxygen  Post-op Assessment: Post -op Vital signs reviewed and stable  Post vital signs: stable  Last Vitals:  Vitals:   03/24/17 0917 03/24/17 1144  BP: 134/86 102/77  Pulse: (!) 52 91  Resp: 18   Temp: 36.6 C 36.2 C    Last Pain:  Vitals:   03/24/17 1144  TempSrc: Temporal  PainSc:          Complications: No apparent anesthesia complications

## 2017-03-24 NOTE — Progress Notes (Signed)
Pt appeared to sleep most of the night while monitored on 15 minute safety checks.  Pt NPO for ECT this am.

## 2017-03-24 NOTE — Plan of Care (Signed)
Problem: Safety: Goal: Ability to remain free from injury will improve Outcome: Progressing Pt safe on the unit at this time, pt told to call staff if needing to ambulate

## 2017-03-24 NOTE — Anesthesia Postprocedure Evaluation (Signed)
Anesthesia Post Note  Patient: Dillon PriestRobert H Kreitzer  Procedure(s) Performed: * No procedures listed *  Patient location during evaluation: PACU Anesthesia Type: General Level of consciousness: awake and alert Pain management: pain level controlled Vital Signs Assessment: post-procedure vital signs reviewed and stable Respiratory status: spontaneous breathing and respiratory function stable Cardiovascular status: stable Anesthetic complications: no     Last Vitals:  Vitals:   03/24/17 0917 03/24/17 1144  BP: 134/86 102/77  Pulse: (!) 52 89  Resp: 18 (!) 30  Temp: 36.6 C 36.2 C    Last Pain:  Vitals:   03/24/17 1144  TempSrc: Temporal  PainSc:                  KEPHART,WILLIAM K

## 2017-03-24 NOTE — H&P (Signed)
Dillon [name] is an 66 y.o. male.   Chief Complaint: Patient has reported depression and anxiety OCD-like symptoms weight loss lack of motivation passive suicidal thoughts HPI: History of recurrent severe depression on top of OCD refusing medicine refusing food and drink withering away.  Past Medical History:  Diagnosis Date  . Kidney stones   . Meningitis    spinal  . OCD (obsessive compulsive disorder)     Past Surgical History:  Procedure Laterality Date  . KIDNEY STONE SURGERY      Family History  Problem Relation Age of Onset  . Cancer Mother        bladder  . Emphysema Father   . Emphysema Sister   . COPD Sister   . Cancer Brother        prostate   Social History:  reports that he has never smoked. He has never used smokeless tobacco. He reports that he does not drink alcohol or use drugs.  Allergies:  Allergies  Allergen Reactions  . Codeine Other (See Comments)    Unsure of reaction    Medications Prior to Admission  Medication Sig Dispense Refill  . fluvoxaMINE (LUVOX) 100 MG tablet TAKE ONE TABLET BY MOUTH AT BEDTIME 30 tablet 0  . OLANZapine zydis (ZYPREXA) 5 MG disintegrating tablet Take 1 tablet (5 mg total) by mouth at bedtime. 30 tablet 1  . tamsulosin (FLOMAX) 0.4 MG CAPS capsule Take 1 capsule (0.4 mg total) by mouth daily after supper. (Patient taking differently: Take 0.4 mg by mouth 2 (two) times daily. ) 30 capsule 1  . traZODone (DESYREL) 50 MG tablet Take 1 tablet (50 mg total) by mouth at bedtime as needed for sleep. 30 tablet 1    Results for orders placed or performed during the hospital encounter of 03/22/17 (from the past 48 hour(s))  Basic metabolic panel     Status: None   Collection Time: 03/23/17  4:45 PM  Result Value Ref Range   Sodium 141 135 - 145 mmol/L   Potassium 3.6 3.5 - 5.1 mmol/L   Chloride 107 101 - 111 mmol/L   CO2 23 22 - 32 mmol/L   Glucose, Bld 98 65 - 99 mg/dL   BUN 11 6 - 20 mg/dL   Creatinine, Ser 1.04 0.61 -  1.24 mg/dL   Calcium 9.7 8.9 - 10.3 mg/dL   GFR calc non Af Amer >60 >60 mL/min   GFR calc Af Amer >60 >60 mL/min    Comment: (NOTE) The eGFR has been calculated using the CKD EPI equation. This calculation has not been validated in all clinical situations. eGFR's persistently <60 mL/min signify possible Chronic Kidney Disease.    Anion gap 11 5 - 15  Glucose, capillary     Status: None   Collection Time: 03/24/17  6:31 AM  Result Value Ref Range   Glucose-Capillary 93 65 - 99 mg/dL   Comment 1 Notify RN    No results found.  Review of Systems  Constitutional: Positive for malaise/fatigue and weight loss.  HENT: Negative.   Eyes: Negative.   Respiratory: Negative.   Cardiovascular: Negative.   Gastrointestinal: Negative.   Musculoskeletal: Negative.   Skin: Negative.   Neurological: Negative.   Psychiatric/Behavioral: Positive for depression and suicidal ideas. Negative for hallucinations, memory loss and substance abuse. The patient is nervous/anxious and has insomnia.     Blood pressure 134/86, pulse (!) 52, temperature 97.8 F (36.6 C), temperature source Oral, resp. rate 18, height 5'  9" (1.753 m), weight 80.7 kg (178 lb), SpO2 98 %. Physical Exam  Nursing note and vitals reviewed. Constitutional: He appears well-developed.  HENT:  Head: Normocephalic and atraumatic.  Eyes: Conjunctivae are normal. Pupils are equal, round, and reactive to light.  Neck: Normal range of motion.  Cardiovascular: Normal heart sounds.   Respiratory: Effort normal.  GI: Soft.  Musculoskeletal: Normal range of motion.  Neurological: He is alert.  Skin: Skin is warm and dry.  Psychiatric: Judgment normal. His mood appears anxious. His affect is blunt. His speech is delayed. He is slowed. Cognition and memory are impaired. He exhibits a depressed mood. He expresses suicidal ideation. He expresses no suicidal plans.     Assessment/Plan Patient was severe depression who has responded  previously ECT will begin ECT treatment today in the hospital  Alethia Berthold, MD 03/24/2017, 10:30 AM

## 2017-03-25 DIAGNOSIS — F332 Major depressive disorder, recurrent severe without psychotic features: Principal | ICD-10-CM

## 2017-03-25 NOTE — Plan of Care (Signed)
Problem: Coping: Goal: Ability to cope will improve Outcome: Not Progressing Continue to be sad & depressed.

## 2017-03-25 NOTE — Progress Notes (Signed)
[facility] LCSW Group Therapy   03/25/2017  1 PM  Type of Therapy: Group Therapy   Participation Level: Did Not Attend. Patient invited to participate but declined.    Lanah Steines F. Belma Dyches, MSW, LCSWA, LCAS     

## 2017-03-25 NOTE — Progress Notes (Signed)
[facility] MD Progress Note  03/25/2017 1:29 PM Dillon [name]  MRN:  [id number]  Subjective:  03/25/2017. Dillon [name] is very depressed and utterly hopeless. He has no energy to get out of bed. Following his last hospitalization he did well for a while but stopped taking medication. He lost over 30 lbs. He is unable to tolerate Ensure. Accepts medication, reports no side effects. He is unable to ambulate independently.  Per nursing: D: Pt denies SI/HI/AVH. Pt is pleasant and cooperative. Pt stated he was tired, pt was encouraged to stay in the bed, and asked to call for assistance when ambulating, pt was told not to ambulate without staff present. Pt still resistant to eating / drinking even though encouragement provided .   A: Pt was offered support and encouragement. Pt was given scheduled medications. Pt was encourage to attend groups. Q 15 minute checks were done for safety.   R: safety maintained on unit.  Principal Problem: Recurrent major depression-severe ([place]) Diagnosis:   Patient Active Problem List   Diagnosis Date Noted  . Recurrent major depression-severe ([place]) [F33.2] 03/22/2017  . Hospital discharge follow-up [Z09] 02/03/2016  . Suicidal ideation [R45.851] 01/04/2016  . Severe recurrent major depression without psychotic features ([place]) [F33.2] 12/15/2015  . Grief [F43.20] 12/15/2015  . Weight loss [R63.4] 12/15/2015  . OCD (obsessive compulsive disorder) [F42.9] 12/15/2015  . Benign prostatic hyperplasia with urinary obstruction [N40.1, N13.8] 11/24/2015  . 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  Past Medical History:  Past Medical History:  Diagnosis Date  . Kidney stones   . Meningitis    spinal  . OCD (obsessive compulsive disorder)     Past Surgical History:  Procedure Laterality Date  . KIDNEY STONE SURGERY     Family History:  Family History  Problem  Relation Age of Onset  . Cancer Mother        bladder  . Emphysema Father   . Emphysema Sister   . COPD Sister   . Cancer Brother        prostate   Family Psychiatric  History: see H&P. Social History:  History  Alcohol Use No     History  Drug Use No    Social History   Social History  . Marital status: Widowed    Spouse name: N/A  . Number of children: N/A  . Years of education: N/A   Social History Main Topics  . Smoking status: Never Smoker  . Smokeless tobacco: Never Used  . Alcohol use No  . Drug use: No  . Sexual activity: Not Currently   Other Topics Concern  . None   Social History Narrative  . None   Additional Social History:                         Sleep: Fair  Appetite:  Poor  Current Medications: Current Facility-Administered Medications  Medication Dose Route Frequency Provider Last Rate Last Dose  . acetaminophen (TYLENOL) tablet 650 mg  650 mg Oral Q6H PRN Clapacs, John T, MD      . alum & mag hydroxide-simeth (MAALOX/MYLANTA) 200-200-20 MG/5ML suspension 30 mL  30 mL Oral Q4H PRN Clapacs, John T, MD      . fluvoxaMINE (LUVOX) tablet 200 mg  200 mg Oral QHS Iyari Hagner B, MD   200 mg at 03/24/17 2327  . magnesium hydroxide (MILK  OF MAGNESIA) suspension 30 mL  30 mL Oral Daily PRN Clapacs, John T, MD      . OLANZapine (ZYPREXA) tablet 5 mg  5 mg Oral QHS Clapacs, John T, MD   5 mg at 03/24/17 2330  . tamsulosin (FLOMAX) capsule 0.4 mg  0.4 mg Oral BID AC Clapacs, Madie Reno, MD   0.4 mg at 03/25/17 4585    Lab Results:  Results for orders placed or performed during the hospital encounter of 03/22/17 (from the past 48 hour(s))  Basic metabolic panel     Status: None   Collection Time: 03/23/17  4:45 PM  Result Value Ref Range   Sodium 141 135 - 145 mmol/L   Potassium 3.6 3.5 - 5.1 mmol/L   Chloride 107 101 - 111 mmol/L   CO2 23 22 - 32 mmol/L   Glucose, Bld 98 65 - 99 mg/dL   BUN 11 6 - 20 mg/dL   Creatinine, Ser 1.04  0.61 - 1.24 mg/dL   Calcium 9.7 8.9 - 10.3 mg/dL   GFR calc non Af Amer >60 >60 mL/min   GFR calc Af Amer >60 >60 mL/min    Comment: (NOTE) The eGFR has been calculated using the CKD EPI equation. This calculation has not been validated in all clinical situations. eGFR's persistently <60 mL/min signify possible Chronic Kidney Disease.    Anion gap 11 5 - 15  Glucose, capillary     Status: None   Collection Time: 03/24/17  6:31 AM  Result Value Ref Range   Glucose-Capillary 93 65 - 99 mg/dL   Comment 1 Notify RN     Blood Alcohol level:  Lab Results  Component Value Date   ETH <5 01/04/2016   ETH <5 92/92/4462    Metabolic Disorder Labs: Lab Results  Component Value Date   HGBA1C 5.0 01/05/2016   Lab Results  Component Value Date   PROLACTIN 29.7 (H) 01/05/2016   Lab Results  Component Value Date   CHOL 140 01/05/2016   TRIG 90 01/05/2016   HDL 43 01/05/2016   CHOLHDL 3.3 01/05/2016   VLDL 18 01/05/2016   LDLCALC 79 01/05/2016    Physical Findings: AIMS:  , ,  ,  ,    CIWA:    COWS:     Musculoskeletal: Strength & Muscle Tone: within normal limits Gait & Station: normal Patient leans: N/A  Psychiatric Specialty Exam: Physical Exam  Nursing note and vitals reviewed. Psychiatric: His affect is blunt. His speech is delayed. He is slowed and withdrawn. Cognition and memory are normal. He expresses impulsivity. He exhibits a depressed mood. He expresses suicidal ideation. He expresses suicidal plans.    Review of Systems  Constitutional: Positive for malaise/fatigue and weight loss.  Neurological: Positive for weakness.  Psychiatric/Behavioral: Positive for depression and suicidal ideas. The patient is nervous/anxious and has insomnia.   All other systems reviewed and are negative.   Blood pressure 94/60, pulse 87, temperature 98 F (36.7 C), temperature source Oral, resp. rate 18, height _0  (1.753 m), weight 80.7 kg (178 lb), SpO2 96 %.Body mass index  is 26.29 kg/m.  General Appearance: Disheveled  Eye Contact:  Minimal  Speech:  Slow and Slurred  Volume:  Decreased  Mood:  Depressed  Affect:  Blunt  Thought Process:  Goal Directed and Descriptions of Associations: Intact  Orientation:  Full (Time, Place, and Person)  Thought Content:  WDL  Suicidal Thoughts:  Yes.  with intent/plan  Homicidal Thoughts:  No  Memory:  Immediate;   Fair Recent;   Fair Remote;   Fair  Judgement:  Impaired  Insight:  Lacking  Psychomotor Activity:  Psychomotor Retardation  Concentration:  Concentration: Fair and Attention Span: Fair  Recall:  [employer] of Knowledge:  Fair  Language:  Fair  Akathisia:  No  Handed:  Right  AIMS (if indicated):     Assets:  Communication Skills Desire for Improvement Financial Resources/Insurance Housing Resilience Social Support  ADL's:  Intact  Cognition:  WNL  Sleep:  Number of Hours: 8.15     Treatment Plan Summary: Daily contact with patient to assess and evaluate symptoms and progress in treatment and Medication management   Dillon [name] is a 66 year old male with a history of depression and anxiety admitted for ECT.  1. Suicidal ideation. The patient is able to contract for safety in the hospital.  2. Mood and anxiety. We continued Abilify and Luvox along with ECT.  3. BPH. He is on Flomax.  4. Metabolic syndrome monitoring.  5. EKG.  6. Disposition. He will be discharged to home.  03/25/2017. We increased Lovox to 200 mg and ordered PT conusult.  Orson Slick, MD 03/25/2017, 1:29 PM

## 2017-03-25 NOTE — Progress Notes (Signed)
Patient is very depressed and stayed in bed.No appetite and energy level low.Patient states "I don't know why I am doing like this".Patient up for meals in wheelchair with much encouragement.No interaction with peers.Compliant with medications.Support & encouragement given.

## 2017-03-25 NOTE — [facility] Group Notes (Signed)
[facility] Group Notes:  (Nursing/MHT/Case Management/Adjunct)  Date:  03/25/2017  Time:  12:51 AM  Type of Therapy:  Evening Wrap-up Group  Participation Level:  Did Not Attend  Participation Quality:  N/A  Affect:  N/A  Cognitive:  N/A  Insight:  None  Engagement in Group:  Did Not Attend  Modes of Intervention:  Activity  Summary of Progress/Problems:  Tomasita MorrowChelsea Nanta Raydell Maners 03/25/2017, 12:51 AM

## 2017-03-26 NOTE — Progress Notes (Signed)
D: Pt denies SI/HI/AVH. Pt is pleasant and cooperative. Pt stated he was tired, pt was encouraged to stay in the bed, and asked to call for assistance when ambulating, pt was told not to ambulate without staff present. Pt still resistant to eating / drinking even though encouragement provided .   A: Pt was offered support and encouragement. Pt was given scheduled medications. Pt was encourage to attend groups. Q 15 minute checks were done for safety.   R: safety maintained on unit.

## 2017-03-26 NOTE — Plan of Care (Signed)
Problem: Safety: Goal: Periods of time without injury will increase Outcome: Progressing Patient remains without injury by observing patient on falls risk, pt uses wheelchair to mobilize around unit and remains on Q 15 minute checks. Will continue to monitor.

## 2017-03-26 NOTE — Progress Notes (Signed)
Patient was only visible on unit when going for meals and snack and when PT came to assess his ability to use a walker. Patient affect appears to be flat, but brighten upon approach. Patient stated his thoughts was better today. Patient did not attend any group or unit activities thus far in shift. Patient is alert and oriented x 4 and breathing unlabored. Patient is calm and cooperative. Patient did not display any disruptive behavior. Patient remains on Q15 minute checks and falls risk. Will continue to monitor patient and notify MD of any changes.

## 2017-03-26 NOTE — [facility] Group Notes (Signed)
[facility] Group Notes:  (Nursing/MHT/Case Management/Adjunct)  Date:  03/26/2017  Time:  11:10 PM  Type of Therapy:  Psychoeducational Skills  Participation Level:  Did Not Attend  Foy GuadalajaraJasmine R Madissen Wyse 03/26/2017, 11:10 PM

## 2017-03-26 NOTE — Progress Notes (Signed)
[facility]  03/26/2017 7:17 AM Dillon Blacksmithobert Henry Hecht  MRN:  [id number]  Subjective:  03/25/2017. Dillon [name] is very depressed and utterly hopeless. He has no energy to get out of bed. Following his last hospitalization he did well for a while but stopped taking medication. He lost over 30 lbs. He is unable to tolerate Ensure. Accepts medication, reports no side effects. He is unable to ambulate independently.  03/26/2017. Dillon [name] reports feeling slightly better but refuses to get up for lunch. He uses a wheel chair. He is despondent and utterly hopeless. Awaiting PT consult.   Per nursing: D:Pt denies SI/HI/AVH. Pt is pleasant and cooperative. Pt stated he was tired, pt was encouraged to stay in the bed, and asked to call for assistance when ambulating, pt was told not to ambulate without staff present. Pt still resistant to eating / drinking even though encouragement provided .   A:Pt was offered support and encouragement. Pt was given scheduled medications. Pt was encourage to attend groups. Q 15 minute checks were done for safety.   R:safety maintained on unit.  Principal Problem: Recurrent major depression-severe ([facility]) Diagnosis:   Patient Active Problem List   Diagnosis Date Noted  . Recurrent major depression-severe ([facility]) [F33.2] 03/22/2017  . Hospital discharge follow-up [Z09] 02/03/2016  . Suicidal ideation [R45.851] 01/04/2016  . Severe recurrent major depression without psychotic features ([facility]) [F33.2] 12/15/2015  . Grief [F43.20] 12/15/2015  . Weight loss [R63.4] 12/15/2015  . OCD (obsessive compulsive disorder) [F42.9] 12/15/2015  . Benign prostatic hyperplasia with urinary obstruction [N40.1, N13.8] 11/24/2015  . 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  Past Medical History:  Past Medical History:  Diagnosis Date  . Kidney stones   . Meningitis    spinal  . OCD (obsessive compulsive disorder)     Past Surgical History:  Procedure Laterality Date  . KIDNEY STONE SURGERY     Family History:  Family History  Problem Relation Age of Onset  . Cancer Mother        bladder  . Emphysema Father   . Emphysema Sister   . COPD Sister   . Cancer Brother        prostate   Family Psychiatric  History: see H&P. Social History:  History  Alcohol Use No     History  Drug Use No    Social History   Social History  . Marital status: Widowed    Spouse name: N/A  . Number of children: N/A  . Years of education: N/A   Social History Main Topics  . Smoking status: Never Smoker  . Smokeless tobacco: Never Used  . Alcohol use No  . Drug use: No  . Sexual activity: Not Currently   Other Topics Concern  . None   Social History Narrative  . None   Additional Social History:                         Sleep: Fair  Appetite:  Poor  Current Medications: Current Facility-Administered Medications  Medication Dose Route Frequency Provider Last Rate Last Dose  . acetaminophen (TYLENOL) tablet 650 mg  650 mg Oral Q6H PRN Clapacs, John T, MD      . alum & mag hydroxide-simeth (MAALOX/MYLANTA) 200-200-20 MG/5ML suspension 30 mL  30 mL Oral Q4H PRN Clapacs, Jackquline DenmarkJohn T, MD      .  fluvoxaMINE (LUVOX) tablet 200 mg  200 mg Oral QHS Pucilowska, Jolanta B, MD   200 mg at 03/25/17 2143  . magnesium hydroxide (MILK OF MAGNESIA) suspension 30 mL  30 mL Oral Daily PRN Clapacs, John T, MD      . OLANZapine (ZYPREXA) tablet 5 mg  5 mg Oral QHS Clapacs, Jackquline Denmark, MD   5 mg at 03/25/17 2141  . tamsulosin (FLOMAX) capsule 0.4 mg  0.4 mg Oral BID AC Clapacs, John T, MD   0.4 mg at 03/25/17 1645    Lab Results:  No results found for this or any previous visit (from the past 48 hour(s)).  Blood Alcohol level:  Lab Results  Component Value Date   ETH <5 01/04/2016   ETH <5 12/15/2015    Metabolic Disorder Labs: Lab Results  Component Value  Date   HGBA1C 5.0 01/05/2016   Lab Results  Component Value Date   PROLACTIN 29.7 (H) 01/05/2016   Lab Results  Component Value Date   CHOL 140 01/05/2016   TRIG 90 01/05/2016   HDL 43 01/05/2016   CHOLHDL 3.3 01/05/2016   VLDL 18 01/05/2016   LDLCALC 79 01/05/2016    Physical Findings: AIMS:  , ,  ,  ,    CIWA:    COWS:     Musculoskeletal: Strength & Muscle Tone: within normal limits Gait & Station: normal Patient leans: N/A  Psychiatric Specialty Exam: Physical Exam  Nursing note and vitals reviewed. Psychiatric: His affect is blunt. His speech is delayed. He is slowed and withdrawn. Cognition and memory are normal. He expresses impulsivity. He exhibits a depressed mood. He expresses suicidal ideation. He expresses suicidal plans.    Review of Systems  Constitutional: Positive for malaise/fatigue and weight loss.  Neurological: Positive for weakness.  Psychiatric/Behavioral: Positive for depression and suicidal ideas. The patient is nervous/anxious and has insomnia.   All other systems reviewed and are negative.   Blood pressure 121/77, pulse 77, temperature 98 F (36.7 C), temperature source Oral, resp. rate 18, height 5\' 9"  (1.753 m), weight 80.7 kg (178 lb), SpO2 96 %.Body mass index is 26.29 kg/m.  General Appearance: Disheveled  Eye Contact:  Minimal  Speech:  Slow and Slurred  Volume:  Decreased  Mood:  Depressed  Affect:  Blunt  Thought Process:  Goal Directed and Descriptions of Associations: Intact  Orientation:  Full (Time, Place, and Person)  Thought Content:  WDL  Suicidal Thoughts:  Yes.  with intent/plan  Homicidal Thoughts:  No  Memory:  Immediate;   Fair Recent;   Fair Remote;   Fair  Judgement:  Impaired  Insight:  Lacking  Psychomotor Activity:  Psychomotor Retardation  Concentration:  Concentration: Fair and Attention Span: Fair  Recall:  [employer] of Knowledge:  Fair  Language:  Fair  Akathisia:  No  Handed:  Right  AIMS (if  indicated):     Assets:  Communication Skills Desire for Improvement Financial Resources/Insurance Housing Resilience Social Support  ADL's:  Intact  Cognition:  WNL  Sleep:  Number of Hours: 8.5     Treatment Plan Summary: Daily contact with patient to assess and evaluate symptoms and progress in treatment and Medication management   Dillon [name] is a 66 year old male with a history of depression and anxiety admitted for ECT.  1. Suicidal ideation. The patient is able to contract for safety in the hospital.  2. Mood and anxiety. We continued Abilify and Luvox along with ECT.  3. BPH. He is on Flomax.  4. Metabolic syndrome monitoring. Lipid panel, TSH and HgbA1C are pending.  5. EKG. Pending.  6. Deconditioning. PT input is greatly appreciated.   7. Disposition. He will be discharged to home.  03/25/2017. We increased Lovox to 200 mg and ordered PT conusult. 03/26/2017. Ordered labs and EKG.  Kristine Linea, MD 03/26/2017, 7:17 AM

## 2017-03-26 NOTE — Evaluation (Signed)
Physical Therapy Evaluation Patient Details Name: Dillon BlacksmithRobert Henry [name] MRN: [id number] DOB: 1951-03-01 Today's Date: 03/26/2017   History of Present Illness  Pt is a 66 y.o. male presenting to hospital d/t refusal of medications, not eating/drinking, and passive suicidal thoughts.  Pt s/p ECT 03/24/17.  PMH includes h/o recurrent depression and OCD.  Clinical Impression  Prior to hospital admission, pt was independent with functional mobility.  Pt lives alone in 1 level home with steps to enter.  Currently pt is independent with bed mobility and CGA to SBA with transfers and ambulation around nursing station using RW.  Pt requesting to use RW d/t feeling weak and unsteady (pt steady with ambulation using RW). Pt reporting not eating or drinking much and reported some dizziness with ambulation; nursing notified; (dizziness resolved after pt returned to bed).  Pt would benefit from skilled PT to address noted impairments and functional limitations (see below for any additional details).  Upon hospital discharge, recommend pt discharge to home with HHPT.    Follow Up Recommendations Home health PT    Equipment Recommendations  Rolling walker with 5" wheels    Recommendations for Other Services       Precautions / Restrictions Precautions Precautions: Fall Restrictions Weight Bearing Restrictions: No      Mobility  Bed Mobility Overal bed mobility: Independent             General bed mobility comments: Supine to/from sit without difficulties; bed flat  Transfers Overall transfer level: Needs assistance Equipment used: Rolling walker (2 wheeled) Transfers: Sit to/from Stand Sit to Stand: Supervision;Min guard         General transfer comment: mild increased effort to stand from low bed but steady without loss of balance  Ambulation/Gait Ambulation/Gait assistance: Supervision;Min guard Ambulation Distance (Feet): 140 Feet Assistive device: Rolling walker (2 wheeled) Gait  Pattern/deviations: Step-through pattern Gait velocity: mildly decreased   General Gait Details: pt steady ambulating with RW  Stairs            Wheelchair Mobility    Modified Rankin (Stroke Patients Only)       Balance Overall balance assessment: Needs assistance Sitting-balance support: No upper extremity supported;Feet supported Sitting balance-Leahy Scale: Normal Sitting balance - Comments: sitting reaching outside of BOS   Standing balance support: Bilateral upper extremity supported;During functional activity Standing balance-Leahy Scale: Good Standing balance comment: ambulating with use of RW                             Pertinent Vitals/Pain Pain Assessment: No/denies pain  Vitals (HR and O2 on room air) stable and WFL throughout treatment session.    Home Living Family/patient expects to be discharged to:: Private residence Living Arrangements: Alone   Type of Home: House Home Access: Stairs to enter Entrance Stairs-Rails: Doctor, general practiceight;Left Entrance Stairs-Number of Steps: 4 Home Layout: One level Home Equipment: Wheelchair - manual      Prior Function Level of Independence: Independent         Comments: Pt reports independence with ADL's/IADL's.  Pt denies any falls in past 6 months.     Hand Dominance        Extremity/Trunk Assessment   Upper Extremity Assessment Upper Extremity Assessment: Generalized weakness    Lower Extremity Assessment Lower Extremity Assessment: Generalized weakness    Cervical / Trunk Assessment Cervical / Trunk Assessment: Normal  Communication   Communication: No difficulties  Cognition Arousal/Alertness: Awake/alert Behavior During  Therapy: Flat affect Overall Cognitive Status: Within Functional Limits for tasks assessed                                        General Comments General comments (skin integrity, edema, etc.): Pt resting in bed upon PT arrival.  Nursing cleared pt  for participation in physical therapy.  Pt agreeable to PT session.    Exercises     Assessment/Plan    PT Assessment Patient needs continued PT services  PT Problem List Decreased strength;Decreased balance;Decreased mobility;Decreased knowledge of use of DME       PT Treatment Interventions DME instruction;Gait training;Stair training;Functional mobility training;Therapeutic activities;Therapeutic exercise;Balance training;Patient/family education    PT Goals (Current goals can be found in the Care Plan section)  Acute Rehab PT Goals Patient Stated Goal: pt did not state goal when asked PT Goal Formulation: With patient    Frequency Min 2X/week   Barriers to discharge        Co-evaluation               AM-PAC PT "6 Clicks" Daily Activity  Outcome Measure Difficulty turning over in bed (including adjusting bedclothes, sheets and blankets)?: None Difficulty moving from lying on back to sitting on the side of the bed? : None Difficulty sitting down on and standing up from a chair with arms (e.g., wheelchair, bedside commode, etc,.)?: A Little Help needed moving to and from a bed to chair (including a wheelchair)?: A Little Help needed walking in hospital room?: A Little Help needed climbing 3-5 steps with a railing? : A Little 6 Click Score: 20    End of Session Equipment Utilized During Treatment: Gait belt Activity Tolerance: Patient tolerated treatment well Patient left: in bed (bed in lowest position) Nurse Communication: Mobility status;Precautions (RW left in room for pt to use per discussion with nursing (nurse reporting no walkers on unit to use)) PT Visit Diagnosis: Muscle weakness (generalized) (M62.81);Difficulty in walking, not elsewhere classified (R26.2)    Time: 4098-1191 PT Time Calculation (min) (ACUTE ONLY): 10 min   Charges:   PT Evaluation $PT Eval Low Complexity: 1 Procedure     PT G Codes:   PT G-Codes **NOT FOR INPATIENT  CLASS** Functional Assessment Tool Used: AM-PAC 6 Clicks Basic Mobility Functional Limitation: Mobility: Walking and moving around Mobility: Walking and Moving Around Current Status (Y7829): At least 20 percent but less than 40 percent impaired, limited or restricted Mobility: Walking and Moving Around Goal Status [phone]): 0 percent impaired, limited or restricted    Hendricks Limes, PT 03/26/17, 3:26 PM [phone]

## 2017-03-26 NOTE — Plan of Care (Signed)
Problem: Safety: Goal: Ability to remain free from injury will improve Outcome: Progressing Pt has not displayed any SIB while on unit.   

## 2017-03-26 NOTE — [facility] Group Notes (Signed)
[facility] Group Notes: (Clinical Social Work)   03/26/2017      Type of Therapy:  Group Therapy   Participation Level:  Did Not Attend despite MHT prompting   Dillon MantleMareida Grossman-Orr, LCSW 03/26/2017, 2:41 PM

## 2017-03-27 ENCOUNTER — Other Ambulatory Visit: Payer: Self-pay | Admitting: Psychiatry

## 2017-03-27 ENCOUNTER — Inpatient Hospital Stay: Payer: BLUE CROSS/BLUE SHIELD | Admitting: Anesthesiology

## 2017-03-27 LAB — LIPID PANEL
CHOLESTEROL: 137 mg/dL (ref 0–200)
HDL: 30 mg/dL — ABNORMAL LOW (ref >40)
LDL CALC: 84 mg/dL (ref 0–99)
TRIGLYCERIDES: 117 mg/dL (ref <150)
Total CHOL/HDL Ratio: 4.6 RATIO
VLDL: 23 mg/dL (ref 0–40)

## 2017-03-27 LAB — GLUCOSE, CAPILLARY: Glucose-Capillary: 91 mg/dL (ref 65–99)

## 2017-03-27 LAB — TSH: TSH: 0.798 u[IU]/mL (ref 0.350–4.500)

## 2017-03-27 MED ORDER — METHOHEXITAL SODIUM 100 MG/10ML IV SOSY
PREFILLED_SYRINGE | INTRAVENOUS | Status: DC | PRN
Start: 2017-03-27 — End: 2017-03-27
  Administered 2017-03-27: 70 mg via INTRAVENOUS

## 2017-03-27 MED ORDER — SUCCINYLCHOLINE CHLORIDE 20 MG/ML IJ SOLN
INTRAMUSCULAR | Status: DC | PRN
  Administered 2017-03-27: 80 mg via INTRAVENOUS

## 2017-03-27 MED ORDER — LABETALOL HCL 5 MG/ML IV SOLN
INTRAVENOUS | Status: AC
  Filled 2017-03-27: qty 4

## 2017-03-27 MED ORDER — SUCCINYLCHOLINE CHLORIDE 20 MG/ML IJ SOLN
INTRAMUSCULAR | Status: AC
  Filled 2017-03-27: qty 1

## 2017-03-27 MED ORDER — ESMOLOL HCL 100 MG/10ML IV SOLN
INTRAVENOUS | Status: AC
  Filled 2017-03-27: qty 10

## 2017-03-27 MED ORDER — SODIUM CHLORIDE 0.9 % IV SOLN
INTRAVENOUS | Status: DC | PRN
  Administered 2017-03-27: 11:00:00 via INTRAVENOUS

## 2017-03-27 MED ORDER — KETOROLAC TROMETHAMINE 30 MG/ML IJ SOLN
30.0000 mg | Freq: Once | INTRAMUSCULAR | Status: AC
  Administered 2017-03-27: 30 mg via INTRAVENOUS

## 2017-03-27 MED ORDER — ESMOLOL HCL 100 MG/10ML IV SOLN
INTRAVENOUS | Status: DC | PRN
  Administered 2017-03-27: 20 mg via INTRAVENOUS

## 2017-03-27 MED ORDER — SODIUM CHLORIDE 0.9 % IV SOLN
500.0000 mL | Freq: Once | INTRAVENOUS | Status: AC
  Administered 2017-03-27: 09:00:00 via INTRAVENOUS

## 2017-03-27 MED ORDER — LABETALOL HCL 5 MG/ML IV SOLN
INTRAVENOUS | Status: DC | PRN
  Administered 2017-03-27: 20 mg via INTRAVENOUS

## 2017-03-27 NOTE — Progress Notes (Signed)
[facility] MD Progress Note  03/27/2017 9:01 PM Dillon [name]  MRN:  [id number]  Subjective:  03/25/2017. Dillon [name] is very depressed and utterly hopeless. He has no energy to get out of bed. Following his last hospitalization he did well for a while but stopped taking medication. He lost over 30 lbs. He is unable to tolerate Ensure. Accepts medication, reports no side effects. He is unable to ambulate independently.  03/26/2017. Dillon [name] reports feeling slightly better but refuses to get up for lunch. He uses a wheel chair. He is despondent and utterly hopeless. Awaiting PT consult.   Follow-up Monday the 11th. Patient had ECT today. Treatment tolerated well. This afternoon he says he is subjectively feeling a little better but he remains in bed with his affect blunted and withdrawn. Still not eating or drinking adequately.  Per nursing: D:Pt denies SI/HI/AVH. Pt is pleasant and cooperative. Pt stated he was tired, pt was encouraged to stay in the bed, and asked to call for assistance when ambulating, pt was told not to ambulate without staff present. Pt still resistant to eating / drinking even though encouragement provided .   A:Pt was offered support and encouragement. Pt was given scheduled medications. Pt was encourage to attend groups. Q 15 minute checks were done for safety.   R:safety maintained on unit.  Principal Problem: Recurrent major depression-severe (HCC) Diagnosis:   Patient Active Problem List   Diagnosis Date Noted  . Recurrent major depression-severe (HCC) [F33.2] 03/22/2017  . Hospital discharge follow-up [Z09] 02/03/2016  . Suicidal ideation [R45.851] 01/04/2016  . Severe recurrent major depression without psychotic features (HCC) [F33.2] 12/15/2015  . Grief [F43.20] 12/15/2015  . Weight loss [R63.4] 12/15/2015  . OCD (obsessive compulsive disorder) [F42.9] 12/15/2015  . Benign prostatic hyperplasia with urinary obstruction [N40.1, N13.8] 11/24/2015  . 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  Past Medical History:  Past Medical History:  Diagnosis Date  . Kidney stones   . Meningitis    spinal  . OCD (obsessive compulsive disorder)     Past Surgical History:  Procedure Laterality Date  . KIDNEY STONE SURGERY     Family History:  Family History  Problem Relation Age of Onset  . Cancer Mother        bladder  . Emphysema Father   . Emphysema Sister   . COPD Sister   . Cancer Brother        prostate   Family Psychiatric  History: see H&P. Social History:  History  Alcohol Use No     History  Drug Use No    Social History   Social History  . Marital status: Widowed    Spouse name: N/A  . Number of children: N/A  . Years of education: N/A   Social History Main Topics  . Smoking status: Never Smoker  . Smokeless tobacco: Never Used  . Alcohol use No  . Drug use: No  . Sexual activity: Not Currently   Other Topics Concern  . None   Social History Narrative  . None   Additional Social History:                         Sleep: Fair  Appetite:  Poor  Current Medications: Current Facility-Administered Medications  Medication Dose Route Frequency Provider Last Rate Last Dose  . acetaminophen (TYLENOL) tablet 650 mg  650 mg Oral Q6H PRN Abdalla Naramore T, MD      . alum & mag hydroxide-simeth (MAALOX/MYLANTA) 200-200-20 MG/5ML suspension 30 mL  30 mL Oral Q4H PRN Charise Leinbach T, MD      . fluvoxaMINE (LUVOX) tablet 200 mg  200 mg Oral QHS Pucilowska, Jolanta B, MD   200 mg at 03/26/17 2151  . magnesium hydroxide (MILK OF MAGNESIA) suspension 30 mL  30 mL Oral Daily PRN Adenike Shidler T, MD      . OLANZapine (ZYPREXA) tablet 5 mg  5 mg Oral QHS Belford Pascucci, Jackquline DenmarkJohn T, MD   5 mg at 03/26/17 2151  . tamsulosin (FLOMAX) capsule 0.4 mg  0.4 mg Oral BID AC Kadan Millstein, Jackquline DenmarkJohn T, MD   0.4 mg at 03/27/17 1644    Lab Results:   Results for orders placed or performed during the hospital encounter of 03/22/17 (from the past 48 hour(s))  Glucose, capillary     Status: None   Collection Time: 03/27/17  6:46 AM  Result Value Ref Range   Glucose-Capillary 91 65 - 99 mg/dL  Lipid panel     Status: Abnormal   Collection Time: 03/27/17  7:15 AM  Result Value Ref Range   Cholesterol 137 0 - 200 mg/dL   Triglycerides 161117 <096<150 mg/dL   HDL 30 (L) >04>40 mg/dL   Total CHOL/HDL Ratio 4.6 RATIO   VLDL 23 0 - 40 mg/dL   LDL Cholesterol 84 0 - 99 mg/dL    Comment:        Total Cholesterol/HDL:CHD Risk Coronary Heart Disease Risk Table                     Men   Women  1/2 Average Risk   3.4   3.3  Average Risk       5.0   4.4  2 X Average Risk   9.6   7.1  3 X Average Risk  23.4   11.0        Use the calculated Patient Ratio above and the CHD Risk Table to determine the patient's CHD Risk.        ATP III CLASSIFICATION (LDL):  <100     mg/dL   Optimal  540-981100-129  mg/dL   Near or Above                    Optimal  130-159  mg/dL   Borderline  191-478160-189  mg/dL   High  >295>190     mg/dL   Very High   TSH     Status: None   Collection Time: 03/27/17  7:15 AM  Result Value Ref Range   TSH 0.798 0.350 - 4.500 uIU/mL    Comment: Performed by a 3rd Generation assay with a functional sensitivity of <=0.01 uIU/mL.    Blood Alcohol level:  Lab Results  Component Value Date   [facility] 01/[facility]2016   ETH <5 12/15/2015    Metabolic Disorder Labs: Lab Results  Component Value Date   HGBA1C 5.0 01/05/2016   Lab Results  Component Value Date   PROLACTIN 29.7 (H) 01/05/2016   Lab Results  Component Value Date   CHOL 137 03/27/2017   TRIG 117 03/27/2017   HDL 30 (L) 03/27/2017   CHOLHDL 4.6 03/27/2017   VLDL 23 03/27/2017   LDLCALC 84 03/27/2017   LDLCALC 79 01/05/2016    Physical Findings: AIMS: Facial and Oral Movements Muscles of Facial Expression: Minimal  Lips and Perioral Area: None, normal Jaw: None,  normal Tongue: None, normal,Extremity Movements Upper (arms, wrists, hands, fingers): None, normal Lower (legs, knees, ankles, toes): None, normal, Trunk Movements Neck, shoulders, hips: None, normal, Overall Severity Severity of abnormal movements (highest score from questions above): None, normal Incapacitation due to abnormal movements: None, normal Patient's awareness of abnormal movements (rate only patient's report): No Awareness, Dental Status Current problems with teeth and/or dentures?: No Does patient usually wear dentures?: No  CIWA:    COWS:     Musculoskeletal: Strength & Muscle Tone: within normal limits Gait & Station: normal Patient leans: N/A  Psychiatric Specialty Exam: Physical Exam  Nursing note and vitals reviewed. Psychiatric: His affect is blunt. His speech is delayed. He is slowed and withdrawn. Cognition and memory are normal. He expresses impulsivity. He exhibits a depressed mood. He expresses suicidal ideation. He expresses suicidal plans.    Review of Systems  Constitutional: Positive for malaise/fatigue and weight loss.  Neurological: Positive for weakness.  Psychiatric/Behavioral: Positive for depression and suicidal ideas. The patient is nervous/anxious and has insomnia.   All other systems reviewed and are negative.   Blood pressure 107/69, pulse 92, temperature 97.8 F (36.6 C), temperature source Oral, resp. rate 18, height 5\' 9"  (1.753 m), weight 79.4 kg (175 lb), SpO2 92 %.Body mass index is 25.84 kg/m.  General Appearance: Disheveled  Eye Contact:  Minimal  Speech:  Slow and Slurred  Volume:  Decreased  Mood:  Depressed  Affect:  Blunt  Thought Process:  Goal Directed and Descriptions of Associations: Intact  Orientation:  Full (Time, Place, and Person)  Thought Content:  WDL  Suicidal Thoughts:  Yes.  with intent/plan  Homicidal Thoughts:  No  Memory:  Immediate;   Fair Recent;   Fair Remote;   Fair  Judgement:  Impaired  Insight:   Lacking  Psychomotor Activity:  Psychomotor Retardation  Concentration:  Concentration: Fair and Attention Span: Fair  Recall:  [employer] of Knowledge:  Fair  Language:  Fair  Akathisia:  No  Handed:  Right  AIMS (if indicated):     Assets:  Communication Skills Desire for Improvement Financial Resources/Insurance Housing Resilience Social Support  ADL's:  Intact  Cognition:  WNL  Sleep:  Number of Hours: 9.5     Treatment Plan Summary: Daily contact with patient to assess and evaluate symptoms and progress in treatment and Medication management   Dillon [name] is a 66 year old male with a history of depression and anxiety admitted for ECT.  1. Suicidal ideation. The patient is able to contract for safety in the hospital.  2. Mood and anxiety. We continued Abilify and Luvox along with ECT.  3. BPH. He is on Flomax.  4. Metabolic syndrome monitoring. Lipid panel, TSH and HgbA1C are pending.  5. EKG. Pending.  6. Deconditioning. PT input is greatly appreciated.   7. Disposition. He will be discharged to home.  03/25/2017. We increased Lovox to 200 mg and ordered PT conusult. 03/26/2017. Ordered labs and EKG.  Patient will continue on the schedule for ECT next treatment on Wednesday. Encouraged him to please start getting out of bed and going to groups. No change to medicine for today.  [name], MD 03/27/2017, 9:01 PM

## 2017-03-27 NOTE — Transfer of Care (Signed)
Immediate Anesthesia Transfer of Care Note  Patient: Dillon [name]  Procedure(s) Performed: * No procedures listed *  Patient Location: PACU  Anesthesia Type:General  Level of Consciousness: awake, alert  and pateint uncooperative  Airway & Oxygen Therapy: Patient Spontanous Breathing and Patient connected to face mask oxygen  Post-op Assessment: Report given to RN and Post -op Vital signs reviewed and stable  Post vital signs: Reviewed and stable  Last Vitals:  Vitals:   03/27/17 0919 03/27/17 1132  BP: 140/79 (!) 124/94  Pulse: 78 80  Resp: 19 15  Temp: 36.5 C 36.4 C    Last Pain:  Vitals:   03/27/17 1132  TempSrc: Temporal  PainSc: 0-No pain         Complications: No apparent anesthesia complications

## 2017-03-27 NOTE — Progress Notes (Signed)
D: Pt isolative in his room.  Encouraged to drink fluids.  Agrees to safety on the unit.  Displays a flat affect. Responds to questions but minimally spontaneity upon approach.  NPO after midnight for ECT in am. A: Encouraged to participate, encouraged to drink fluids.Monitored on 15 minute safety checks and maintained safety on the unit. R: Depressed, anxious and minimal participation. Monitor safety and cont tx plan.

## 2017-03-27 NOTE — Plan of Care (Signed)
Problem: Coping: Goal: Ability to verbalize feelings will improve Outcome: Progressing Patient verbalized feelings to staff.    

## 2017-03-27 NOTE — [facility] Group Notes (Signed)
[facility] LCSW Group Therapy Note  Date/Time: 03/27/17, 0930  Type of Therapy and Topic:  Group Therapy:  Overcoming Obstacles  Participation Level:  Pt did not attend.  Description of Group:    In this group patients will be encouraged to explore what they see as obstacles to their own wellness and recovery. They will be guided to discuss their thoughts, feelings, and behaviors related to these obstacles. The group will process together ways to cope with barriers, with attention given to specific choices patients can make. Each patient will be challenged to identify changes they are motivated to make in order to overcome their obstacles. This group will be process-oriented, with patients participating in exploration of their own experiences as well as giving and receiving support and challenge from other group members.  Therapeutic Goals: 1. Patient will identify personal and current obstacles as they relate to admission. 2. Patient will identify barriers that currently interfere with their wellness or overcoming obstacles.  3. Patient will identify feelings, thought process and behaviors related to these barriers. 4. Patient will identify two changes they are willing to make to overcome these obstacles:    Summary of Patient Progress      Therapeutic Modalities:   Cognitive Behavioral Therapy Solution Focused Therapy Motivational Interviewing Relapse Prevention Therapy  Greg Jacoria Keiffer, LCSW 

## 2017-03-27 NOTE — Tx Team (Signed)
Interdisciplinary Treatment and Diagnostic Plan Update  03/27/2017 Time of Session: [address] Blacksmith[address] Henry Cadman MRN: [id number]  Principal Diagnosis: Recurrent major depression-severe [facility](HCC)  Secondary Diagnoses: Principal Problem:   Recurrent major depression-severe (HCC) Active Problems:   Benign prostatic hyperplasia with urinary obstruction   OCD (obsessive compulsive disorder)   Current Medications:  Current Facility-Administered Medications  Medication Dose Route Frequency Provider Last Rate Last Dose  . acetaminophen (TYLENOL) tablet 650 mg  650 mg Oral Q6H PRN Clapacs, John T, MD      . alum & mag hydroxide-simeth (MAALOX/MYLANTA) 200-200-20 MG/5ML suspension 30 mL  30 mL Oral Q4H PRN Clapacs, John T, MD      . fluvoxaMINE (LUVOX) tablet 200 mg  200 mg Oral QHS Pucilowska, Jolanta B, MD   200 mg at 03/26/17 2151  . magnesium hydroxide (MILK OF MAGNESIA) suspension 30 mL  30 mL Oral Daily PRN Clapacs, John T, MD      . OLANZapine (ZYPREXA) tablet 5 mg  5 mg Oral QHS Clapacs, Jackquline DenmarkJohn T, MD   5 mg at 03/26/17 2151  . tamsulosin (FLOMAX) capsule 0.4 mg  0.4 mg Oral BID AC Clapacs, John T, MD   0.4 mg at 03/26/17 1630   PTA Medications: Prescriptions Prior to Admission  Medication Sig Dispense Refill Last Dose  . fluvoxaMINE (LUVOX) 100 MG tablet TAKE ONE TABLET BY MOUTH AT BEDTIME 30 tablet 0   . OLANZapine zydis (ZYPREXA) 5 MG disintegrating tablet Take 1 tablet (5 mg total) by mouth at bedtime. 30 tablet 1 Taking  . tamsulosin (FLOMAX) 0.4 MG CAPS capsule Take 1 capsule (0.4 mg total) by mouth daily after supper. (Patient taking differently: Take 0.4 mg by mouth 2 (two) times daily. ) 30 capsule 1 Taking  . traZODone (DESYREL) 50 MG tablet Take 1 tablet (50 mg total) by mouth at bedtime as needed for sleep. 30 tablet 1 Taking    Patient Stressors: Loss of wife of 44 years approximately 2 years ago Medication change or noncompliance  Patient Strengths: Average or above average  intelligence Capable of independent living Supportive family/friends  Treatment Modalities: Medication Management, Group therapy, Case management,  1 to 1 session with clinician, Psychoeducation, Recreational therapy.   Physician Treatment Plan for Primary Diagnosis: Recurrent major depression-severe (HCC) Long Term Goal(s): Improvement in symptoms so as ready for discharge Improvement in symptoms so as ready for discharge   Short Term Goals: Compliance with prescribed medications will improve Ability to identify triggers associated with substance abuse/mental health issues will improve Ability to disclose and discuss suicidal ideas Ability to demonstrate self-control will improve  Medication Management: Evaluate patient's response, side effects, and tolerance of medication regimen.  Therapeutic Interventions: 1 to 1 sessions, Unit Group sessions and Medication administration.  Evaluation of Outcomes: Progressing  Physician Treatment Plan for Secondary Diagnosis: Principal Problem:   Recurrent major depression-severe (HCC) Active Problems:   Benign prostatic hyperplasia with urinary obstruction   OCD (obsessive compulsive disorder)  Long Term Goal(s): Improvement in symptoms so as ready for discharge Improvement in symptoms so as ready for discharge   Short Term Goals: Compliance with prescribed medications will improve Ability to identify triggers associated with substance abuse/mental health issues will improve Ability to disclose and discuss suicidal ideas Ability to demonstrate self-control will improve     Medication Management: Evaluate patient's response, side effects, and tolerance of medication regimen.  Therapeutic Interventions: 1 to 1 sessions, Unit Group sessions and Medication administration.  Evaluation of Outcomes: Progressing  RN Treatment Plan for Primary Diagnosis: Recurrent major depression-severe (HCC) Long Term Goal(s): Knowledge of disease and  therapeutic regimen to maintain health will improve  Short Term Goals: Ability to verbalize feelings will improve, Ability to disclose and discuss suicidal ideas, Ability to identify and develop effective coping behaviors will improve and Compliance with prescribed medications will improve  Medication Management: RN will administer medications as ordered by provider, will assess and evaluate patient's response and provide education to patient for prescribed medication. RN will report any adverse and/or side effects to prescribing provider.  Therapeutic Interventions: 1 on 1 counseling sessions, Psychoeducation, Medication administration, Evaluate responses to treatment, Monitor vital signs and CBGs as ordered, Perform/monitor CIWA, COWS, AIMS and Fall Risk screenings as ordered, Perform wound care treatments as ordered.  Evaluation of Outcomes: Progressing   LCSW Treatment Plan for Primary Diagnosis: Recurrent major depression-severe (HCC) Long Term Goal(s): Safe transition to appropriate next level of care at discharge, Engage patient in therapeutic group addressing interpersonal concerns.  Short Term Goals: Engage patient in aftercare planning with referrals and resources and Increase skills for wellness and recovery  Therapeutic Interventions: Assess for all discharge needs, 1 to 1 time with Social worker, Explore available resources and support systems, Assess for adequacy in community support network, Educate family and significant other(s) on suicide prevention, Complete Psychosocial Assessment, Interpersonal group therapy.  Evaluation of Outcomes: Progressing   Progress in Treatment: Attending groups: No. Participating in groups: No. Taking medication as prescribed: Yes. Toleration medication: Yes. Family/Significant other contact made: No, will contact:  son Patient understands diagnosis: Yes. Discussing patient identified problems/goals with staff: Yes. Medical problems  stabilized or resolved: Yes. Denies suicidal/homicidal ideation: Yes. Issues/concerns per patient self-inventory: No. Other: none  New problem(s) identified: No, Describe:  none  New Short Term/Long Term Goal(s):  Discharge Plan or Barriers: CSW assessing for appropriate plan.  Reason for Continuation of Hospitalization: Other; describe ECT  Estimated Length of Stay: 7 days  Attendees: Patient: Dillon [name] 03/27/2017 3:30 PM  Physician: Dr. Toni Amend, MD 03/27/2017 3:30 PM  Nursing: Leonia Reader, RN 03/27/2017 3:30 PM  RN Care Manager: 03/27/2017 3:30 PM  Social Worker: Daleen Squibb, LCSW 03/27/2017 3:30 PM  Recreational Therapist:  03/27/2017 3:30 PM  Other:  03/27/2017 3:30 PM  Other:  03/27/2017 3:30 PM  Other: 03/27/2017 3:30 PM    Scribe for Treatment Team: Lorri Frederick, LCSW 03/27/2017 3:30 PM

## 2017-03-27 NOTE — Anesthesia Preprocedure Evaluation (Signed)
Anesthesia Evaluation  Patient identified by MRN, date of birth, ID band Patient awake    Reviewed: Allergy & Precautions, NPO status , Patient's Chart, lab work & pertinent test results  History of Anesthesia Complications Negative for: history of anesthetic complications  Airway Mallampati: II  TM Distance: >3 FB Neck ROM: Full    Dental  (+) Poor Dentition, Chipped, Loose, Missing   Pulmonary neg pulmonary ROS, neg sleep apnea, neg COPD,    Pulmonary exam normal breath sounds clear to auscultation- rhonchi (-) wheezing      Cardiovascular Exercise Tolerance: Poor hypertension, Pt. on home beta blockers and Pt. on medications (-) CAD, (-) Past MI and (-) Cardiac Stents Normal cardiovascular exam Rhythm:Regular Rate:Normal - Systolic murmurs and - Diastolic murmurs    Neuro/Psych PSYCHIATRIC DISORDERS Depression negative neurological ROS     GI/Hepatic negative GI ROS, Neg liver ROS,   Endo/Other  negative endocrine ROSneg diabetes  Renal/GU Renal disease (stones)Hx of stones.     Musculoskeletal negative musculoskeletal ROS (+)   Abdominal Normal abdominal exam  (+) - obese,   Peds  Hematology negative hematology ROS (+)   Anesthesia Other Findings Past Medical History: No date: Kidney stones No date: Meningitis     Comment: spinal No date: OCD (obsessive compulsive disorder)   Reproductive/Obstetrics                             Anesthesia Physical  Anesthesia Plan  ASA: III  Anesthesia Plan: General   Post-op Pain Management:    Induction: Intravenous  PONV Risk Score and Plan: 1 and Ondansetron  Airway Management Planned: Mask  Additional Equipment:   Intra-op Plan:   Post-operative Plan:   Informed Consent: I have reviewed the patients History and Physical, chart, labs and discussed the procedure including the risks, benefits and alternatives for the proposed  anesthesia with the patient or authorized representative who has indicated his/her understanding and acceptance.     Plan Discussed with: CRNA and Anesthesiologist  Anesthesia Plan Comments:         Anesthesia Quick Evaluation

## 2017-03-27 NOTE — Anesthesia Procedure Notes (Signed)
Date/Time: 03/27/2017 11:15 AM Performed by: Marlana SalvageJESSUP, Jericka Kadar Pre-anesthesia Checklist: Patient identified, Emergency Drugs available, Suction available, Patient being monitored and Timeout performed Patient Re-evaluated:Patient Re-evaluated prior to inductionOxygen Delivery Method: Circle system utilized Preoxygenation: Pre-oxygenation with 100% oxygen Intubation Type: IV induction Ventilation: Mask ventilation without difficulty Airway Equipment and Method: Bite block Placement Confirmation: positive ETCO2 Dental Injury: Teeth and Oropharynx as per pre-operative assessment

## 2017-03-27 NOTE — Progress Notes (Signed)
Patient tolerated ECT.Patients affect is continues to be sad & depressed.Patient verbalized that he don't feel like doing anything.Stayed in bed.Out of room for meals in wheelchair with much encouragement.Appetite & energy level poor.Denies suicidal or homicidal ideations and AV hallucinations.Support & encouragement given.

## 2017-03-27 NOTE — H&P (Signed)
Dillon BlacksmithRobert Henry [name] is an 66 y.o. male.   Chief Complaint: Patient is flat blunt still not eating HPI: History of recurrent severe depression along with OCD  Past Medical History:  Diagnosis Date  . Kidney stones   . Meningitis    spinal  . OCD (obsessive compulsive disorder)     Past Surgical History:  Procedure Laterality Date  . KIDNEY STONE SURGERY      Family History  Problem Relation Age of Onset  . Cancer Mother        bladder  . Emphysema Father   . Emphysema Sister   . COPD Sister   . Cancer Brother        prostate   Social History:  reports that he has never smoked. He has never used smokeless tobacco. He reports that he does not drink alcohol or use drugs.  Allergies:  Allergies  Allergen Reactions  . Codeine Other (See Comments)    Unsure of reaction    Medications Prior to Admission  Medication Sig Dispense Refill  . fluvoxaMINE (LUVOX) 100 MG tablet TAKE ONE TABLET BY MOUTH AT BEDTIME 30 tablet 0  . OLANZapine zydis (ZYPREXA) 5 MG disintegrating tablet Take 1 tablet (5 mg total) by mouth at bedtime. 30 tablet 1  . tamsulosin (FLOMAX) 0.4 MG CAPS capsule Take 1 capsule (0.4 mg total) by mouth daily after supper. (Patient taking differently: Take 0.4 mg by mouth 2 (two) times daily. ) 30 capsule 1  . traZODone (DESYREL) 50 MG tablet Take 1 tablet (50 mg total) by mouth at bedtime as needed for sleep. 30 tablet 1    Results for orders placed or performed during the hospital encounter of 03/22/17 (from the past 48 hour(s))  Glucose, capillary     Status: None   Collection Time: 03/27/17  6:46 AM  Result Value Ref Range   Glucose-Capillary 91 65 - 99 mg/dL  Lipid panel     Status: Abnormal   Collection Time: 03/27/17  7:15 AM  Result Value Ref Range   Cholesterol 137 0 - 200 mg/dL   Triglycerides 161117 <096<150 mg/dL   HDL 30 (L) >04>40 mg/dL   Total CHOL/HDL Ratio 4.6 RATIO   VLDL 23 0 - 40 mg/dL   LDL Cholesterol 84 0 - 99 mg/dL    Comment:        Total  Cholesterol/HDL:CHD Risk Coronary Heart Disease Risk Table                     Men   Women  1/2 Average Risk   3.4   3.3  Average Risk       5.0   4.4  2 X Average Risk   9.6   7.1  3 X Average Risk  23.4   11.0        Use the calculated Patient Ratio above and the CHD Risk Table to determine the patient's CHD Risk.        ATP III CLASSIFICATION (LDL):  <100     mg/dL   Optimal  540-981100-129  mg/dL   Near or Above                    Optimal  130-159  mg/dL   Borderline  191-478160-189  mg/dL   High  >295>190     mg/dL   Very High   TSH     Status: None   Collection Time: 03/27/17  7:15 AM  Result Value Ref Range   TSH 0.798 0.350 - 4.500 uIU/mL    Comment: Performed by a 3rd Generation assay with a functional sensitivity of <=0.01 uIU/mL.   No results found.  Review of Systems  Constitutional: Positive for malaise/fatigue and weight loss.  HENT: Negative.   Eyes: Negative.   Respiratory: Negative.   Cardiovascular: Negative.   Gastrointestinal: Negative.   Musculoskeletal: Negative.   Skin: Negative.   Neurological: Positive for dizziness and weakness.  Psychiatric/Behavioral: Negative for depression, hallucinations, memory loss, substance abuse and suicidal ideas. The patient is nervous/anxious and has insomnia.     Blood pressure 140/79, pulse 78, temperature 97.7 F (36.5 C), temperature source Oral, resp. rate 19, height 5\' 9"  (1.753 m), weight 79.4 kg (175 lb), SpO2 98 %. Physical Exam  Nursing note and vitals reviewed. Constitutional: He appears well-developed and well-nourished.  HENT:  Head: Normocephalic and atraumatic.  Eyes: Conjunctivae are normal. Pupils are equal, round, and reactive to light.  Neck: Normal range of motion.  Cardiovascular: Regular rhythm and normal heart sounds.   Respiratory: Effort normal. No respiratory distress.  GI: Soft.  Musculoskeletal: Normal range of motion.  Neurological: He is alert.  Skin: Skin is warm and dry.  Psychiatric: His  affect is blunt. His speech is delayed. He is slowed. He expresses inappropriate judgment. He expresses no suicidal ideation. He exhibits abnormal recent memory.     Assessment/Plan Continue ECT treatment as long as we can get some improvement to manage as tolerated. Continue current medicine. Supportive counseling encouragement and therapy on the unit  [name], MD 03/27/2017, 11:07 AM

## 2017-03-27 NOTE — Plan of Care (Signed)
Problem: Coping: Goal: Ability to cope will improve Outcome: Not Progressing Stayed in bed & not willing to participate in group activities.

## 2017-03-27 NOTE — Anesthesia Postprocedure Evaluation (Signed)
Anesthesia Post Note  Patient: Burman BlacksmithRobert Henry Wimberly  Procedure(s) Performed: * No procedures listed *  Patient location during evaluation: PACU Anesthesia Type: General Level of consciousness: awake and alert Pain management: pain level controlled Vital Signs Assessment: post-procedure vital signs reviewed and stable Respiratory status: spontaneous breathing, nonlabored ventilation and respiratory function stable Cardiovascular status: blood pressure returned to baseline and stable Postop Assessment: no signs of nausea or vomiting Anesthetic complications: no     Last Vitals:  Vitals:   03/27/17 1202 03/27/17 1212  BP: 104/78 112/77  Pulse: 79 78  Resp: 16   Temp: 36.2 C     Last Pain:  Vitals:   03/27/17 1202  TempSrc:   PainSc: 0-No pain                 Monish Haliburton

## 2017-03-27 NOTE — Anesthesia Post-op Follow-up Note (Cosign Needed)
Anesthesia QCDR form completed.        

## 2017-03-27 NOTE — Procedures (Signed)
ECT SERVICES Physician's Interval Evaluation & Treatment Note  Patient Identification: Dillon BlacksmithRobert Henry [name] MRN:  [id number] Date of Evaluation:  03/27/2017 TX #: 2  MADRS:   MMSE:   P.E. Findings:  Looks dry. No other acute findings. Heart and lungs normal.  Psychiatric Interval Note:  Mood and affect flat blunt oh energy  Subjective:  Patient is a 66 y.o. male seen for evaluation for Electroconvulsive Therapy. Not hungry not eating no energy  Treatment Summary:   [x]   Right Unilateral             []  Bilateral   % Energy : 0.3 ms 70%   Impedance: 2070 ohms  Seizure Energy Index: 4499 V squared  Postictal Suppression Index: 66%  Seizure Concordance Index: 87%  Medications  Pre Shock: Toradol 30 mg Brevital 70 mg succinylcholine 80 mg  Post Shock:    Seizure Duration: 13 seconds by EMG 47 seconds by EEG   Comments: Follow-up Wednesday continue inpatient for now while he is still not eating and drinking especially   Lungs:  [x]   Clear to auscultation               []  Other:   Heart:    [x]   Regular rhythm             []  irregular rhythm    [x]   Previous H&P reviewed, patient examined and there are NO CHANGES                 []   Previous H&P reviewed, patient examined and there are changes noted.   Mordecai RasmussenJohn Clapacs, MD 6/11/201811:09 AM

## 2017-03-27 NOTE — Progress Notes (Signed)
D: Pt denies SI/HI/AVH, affect is flat but brightens upon approach. Patient had ECT, no memory loss noted, he appears less anxious and he is interacting with peers and staff appropriately.  A: Pt was offered support and encouragement. Pt was given scheduled medications. Pt was encouraged to attend groups. Q 15 minute checks were done for safety.  R:Pt did not attends evening group. Pt is taking medication. Pt has no complaints.Pt receptive to treatment and safety maintained on unit.

## 2017-03-28 LAB — HEMOGLOBIN A1C
Hgb A1c MFr Bld: 4.8 % (ref 4.8–5.6)
Mean Plasma Glucose: 91 mg/dL

## 2017-03-28 LAB — GLUCOSE, CAPILLARY: GLUCOSE-CAPILLARY: 102 mg/dL — AB (ref 65–99)

## 2017-03-28 NOTE — Progress Notes (Signed)
Recreation Therapy Notes  Date: 06.12.18 Time: 9:30 am Location: Craft Room  Group Topic: Self-expression  Goal Area(s) Addresses:  Patient will identify one color per emotion listed on wheel. Patient will verbalize benefit of using art as a means of self-expression. Patient will verbalize one emotion experienced during session. Patient will be educated on other forms of self-expression.  Behavioral Response: Did not attend  Intervention: Emotion Wheel  Activity: Patients were given an Emotion Wheel worksheet and were instructed to pick a color for each emotion listed on the wheel.  Education: LRT educated patients on other forms of self-expression.  Education Outcome: Patient did not attend group.  Clinical Observations/Feedback: Patient did not attend group.  Jacquelynn CreeGreene,Tacori Kvamme M, LRT/CTRS 03/28/2017 10:23 AM

## 2017-03-28 NOTE — Progress Notes (Signed)
Patient is taking little initiative to getting out of bed,going for groups and taking a shower.Continues with a flat affect but interacts with staff more.Denies suicidal or homicidal ideations and AV hallucinations.Compliant with medications.Support & encouragement given.

## 2017-03-28 NOTE — Progress Notes (Signed)
Patient is alert and oriented x 3, affect is flat and sad but brightens upon approach. Patient rated depression a 5/10 and he stated having ECT has really helped him feel better. Patient was offered more support and encouragement. Patient was given scheduled  Medication. Patient was encouraged to attend group. 15 minutes checks were done for safety. Patient did not attend evening group. Patient was complaint with medication. Patient is receptive to treatment and safety maintained on unit.will continue to closely monitor.

## 2017-03-28 NOTE — Progress Notes (Signed)
[facility]  03/28/2017 5:09 PM Burman BlacksmithRobert Henry Parisi  MRN:  [id number]  Subjective:  03/25/2017. [name] is very depressed and utterly hopeless. He has no energy to get out of bed. Following his last hospitalization he did well for a while but stopped taking medication. He lost over 30 lbs. He is unable to tolerate Ensure. Accepts medication, reports no side effects. He is unable to ambulate independently.  03/26/2017. [name] reports feeling slightly better but refuses to get up for lunch. He uses a wheel chair. He is despondent and utterly hopeless. Awaiting PT consult.   Follow-up Monday the 11th. Patient had ECT today. Treatment tolerated well. This afternoon he says he is subjectively feeling a little better but he remains in bed with his affect blunted and withdrawn. Still not eating or drinking adequately.  Follow-up Tuesday the 12th. Patient seen this morning. He was awake and interactive but as usual answers questions as briefly as possible. Minimal physical activity. Still not eating and drinking adequately. Denied suicidal ideation. Admitted to still having some obsessive worry  Per nursing: D:Pt denies SI/HI/AVH. Pt is pleasant and cooperative. Pt stated he was tired, pt was encouraged to stay in the bed, and asked to call for assistance when ambulating, pt was told not to ambulate without staff present. Pt still resistant to eating / drinking even though encouragement provided .   A:Pt was offered support and encouragement. Pt was given scheduled medications. Pt was encourage to attend groups. Q 15 minute checks were done for safety.   R:safety maintained on unit.  Principal Problem: Recurrent major depression-severe (HCC) Diagnosis:   Patient Active Problem List   Diagnosis Date Noted  . Recurrent major depression-severe (HCC) [F33.2] 03/22/2017  . Hospital discharge follow-up [Z09] 02/03/2016  . Suicidal ideation [R45.851] 01/04/2016  . Severe recurrent major  depression without psychotic features (HCC) [F33.2] 12/15/2015  . Grief [F43.20] 12/15/2015  . Weight loss [R63.4] 12/15/2015  . OCD (obsessive compulsive disorder) [F42.9] 12/15/2015  . Benign prostatic hyperplasia with urinary obstruction [N40.1, N13.8] 11/24/2015  . 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  Past Medical History:  Past Medical History:  Diagnosis Date  . Kidney stones   . Meningitis    spinal  . OCD (obsessive compulsive disorder)     Past Surgical History:  Procedure Laterality Date  . KIDNEY STONE SURGERY     Family History:  Family History  Problem Relation Age of Onset  . Cancer Mother        bladder  . Emphysema Father   . Emphysema Sister   . COPD Sister   . Cancer Brother        prostate   Family Psychiatric  History: see H&P. Social History:  History  Alcohol Use No     History  Drug Use No    Social History   Social History  . Marital status: Widowed    Spouse name: N/A  . Number of children: N/A  . Years of education: N/A   Social History Main Topics  . Smoking status: Never Smoker  . Smokeless tobacco: Never Used  . Alcohol use No  . Drug use: No  . Sexual activity: Not Currently   Other Topics Concern  . None   Social History Narrative  . None   Additional Social History:  Sleep: Fair  Appetite:  Poor  Current Medications: Current Facility-Administered Medications  Medication Dose Route Frequency Provider Last Rate Last Dose  . acetaminophen (TYLENOL) tablet 650 mg  650 mg Oral Q6H PRN Clapacs, John T, MD      . alum & mag hydroxide-simeth (MAALOX/MYLANTA) 200-200-20 MG/5ML suspension 30 mL  30 mL Oral Q4H PRN Clapacs, John T, MD      . fluvoxaMINE (LUVOX) tablet 200 mg  200 mg Oral QHS Pucilowska, Jolanta B, MD   200 mg at 03/27/17 2219  . magnesium hydroxide (MILK OF  MAGNESIA) suspension 30 mL  30 mL Oral Daily PRN Clapacs, John T, MD      . OLANZapine (ZYPREXA) tablet 5 mg  5 mg Oral QHS Clapacs, John T, MD   5 mg at 03/27/17 2219  . tamsulosin (FLOMAX) capsule 0.4 mg  0.4 mg Oral BID AC Clapacs, Jackquline Denmark, MD   0.4 mg at 03/28/17 1654    Lab Results:  Results for orders placed or performed during the hospital encounter of 03/22/17 (from the past 48 hour(s))  Glucose, capillary     Status: None   Collection Time: 03/27/17  6:46 AM  Result Value Ref Range   Glucose-Capillary 91 65 - 99 mg/dL  Lipid panel     Status: Abnormal   Collection Time: 03/27/17  7:15 AM  Result Value Ref Range   Cholesterol 137 0 - 200 mg/dL   Triglycerides 161 <096 mg/dL   HDL 30 (L) >04 mg/dL   Total CHOL/HDL Ratio 4.6 RATIO   VLDL 23 0 - 40 mg/dL   LDL Cholesterol 84 0 - 99 mg/dL    Comment:        Total Cholesterol/HDL:CHD Risk Coronary Heart Disease Risk Table                     Men   Women  1/2 Average Risk   3.4   3.3  Average Risk       5.0   4.4  2 X Average Risk   9.6   7.1  3 X Average Risk  23.4   11.0        Use the calculated Patient Ratio above and the CHD Risk Table to determine the patient's CHD Risk.        ATP III CLASSIFICATION (LDL):  <100     mg/dL   Optimal  540-981  mg/dL   Near or Above                    Optimal  130-159  mg/dL   Borderline  191-478  mg/dL   High  >295     mg/dL   Very High   TSH     Status: None   Collection Time: 03/27/17  7:15 AM  Result Value Ref Range   TSH 0.798 0.350 - 4.500 uIU/mL    Comment: Performed by a 3rd Generation assay with a functional sensitivity of <=0.01 uIU/mL.  Hemoglobin A1c     Status: None   Collection Time: 03/27/17  7:15 AM  Result Value Ref Range   Hgb A1c MFr Bld 4.8 4.8 - 5.6 %    Comment: (NOTE)         Pre-diabetes: 5.7 - 6.4         Diabetes: >6.4         Glycemic control for adults with diabetes: <7.0    Mean Plasma Glucose 91 mg/dL  Comment: (NOTE) Performed At: [facility] [address] [place], [place] [id number] Mila Homer MD WU:[id number]   Glucose, capillary     Status: Abnormal   Collection Time: 03/28/17  6:34 AM  Result Value Ref Range   Glucose-Capillary 102 (H) 65 - 99 mg/dL    Blood Alcohol level:  Lab Results  Component Value Date   ETH <5 01/04/2016   ETH <5 12/15/2015    Metabolic Disorder Labs: Lab Results  Component Value Date   HGBA1C 4.8 03/27/2017   MPG 91 03/27/2017   Lab Results  Component Value Date   PROLACTIN 29.7 (H) 01/05/2016   Lab Results  Component Value Date   CHOL 137 03/27/2017   TRIG 117 03/27/2017   HDL 30 (L) 03/27/2017   CHOLHDL 4.6 03/27/2017   VLDL 23 03/27/2017   LDLCALC 84 03/27/2017   LDLCALC 79 01/05/2016    Physical Findings: AIMS: Facial and Oral Movements Muscles of Facial Expression: Minimal Lips and Perioral Area: None, normal Jaw: None, normal Tongue: None, normal,Extremity Movements Upper (arms, wrists, hands, fingers): None, normal Lower (legs, knees, ankles, toes): None, normal, Trunk Movements Neck, shoulders, hips: None, normal, Overall Severity Severity of abnormal movements (highest score from questions above): None, normal Incapacitation due to abnormal movements: None, normal Patient's awareness of abnormal movements (rate only patient's report): No Awareness, Dental Status Current problems with teeth and/or dentures?: No Does patient usually wear dentures?: No  CIWA:    COWS:     Musculoskeletal: Strength & Muscle Tone: within normal limits Gait & Station: normal Patient leans: N/A  Psychiatric Specialty Exam: Physical Exam  Nursing note and vitals reviewed. Psychiatric: His affect is blunt. His speech is delayed. He is slowed and withdrawn. Cognition and memory are normal. He expresses impulsivity. He exhibits a depressed mood. He expresses suicidal ideation. He expresses suicidal plans.    Review of Systems  Constitutional: Positive for  malaise/fatigue and weight loss.  Neurological: Positive for weakness.  Psychiatric/Behavioral: Positive for depression and suicidal ideas. The patient is nervous/anxious and has insomnia.   All other systems reviewed and are negative.   Blood pressure 130/73, pulse 77, temperature 97.8 F (36.6 C), temperature source Oral, resp. rate 18, height 5\' 9"  (1.753 m), weight 79.4 kg (175 lb), SpO2 92 %.Body mass index is 25.84 kg/m.  General Appearance: Disheveled  Eye Contact:  Minimal  Speech:  Slow and Slurred  Volume:  Decreased  Mood:  Depressed  Affect:  Blunt  Thought Process:  Goal Directed and Descriptions of Associations: Intact  Orientation:  Full (Time, Place, and Person)  Thought Content:  WDL  Suicidal Thoughts:  Yes.  with intent/plan  Homicidal Thoughts:  No  Memory:  Immediate;   Fair Recent;   Fair Remote;   Fair  Judgement:  Impaired  Insight:  Lacking  Psychomotor Activity:  Psychomotor Retardation  Concentration:  Concentration: Fair and Attention Span: Fair  Recall:  [employer] of Knowledge:  Fair  Language:  Fair  Akathisia:  No  Handed:  Right  AIMS (if indicated):     Assets:  Communication Skills Desire for Improvement Financial Resources/Insurance Housing Resilience Social Support  ADL's:  Intact  Cognition:  WNL  Sleep:  Number of Hours: 7.75     Treatment Plan Summary: Daily contact with patient to assess and evaluate symptoms and progress in treatment and Medication management   Mr. Jeffus is a 66 year old male with a history of depression and anxiety admitted  for ECT.  1. Suicidal ideation. The patient is able to contract for safety in the hospital.  2. Mood and anxiety. We continued Abilify and Luvox along with ECT.  3. BPH. He is on Flomax.  4. Metabolic syndrome monitoring. Lipid panel, TSH and HgbA1C are pending.  5. EKG. Pending.  6. Deconditioning. PT input is greatly appreciated.   7. Disposition. He will be discharged to  home.  03/25/2017. We increased Lovox to 200 mg and ordered PT conusult. 03/26/2017. Ordered labs and EKG.  Patient with severe depression and OCD. Continue current medicine and continue ECT next treatment scheduled tomorrow. Encourage patient to try to force himself to eat and drink more.  [name], MD 03/28/2017, 5:09 PM

## 2017-03-28 NOTE — [facility] Group Notes (Signed)
[facility] LCSW Group Therapy [facility] LCSW Group Therapy Note  Date/Time:03/28/2017, 1pm  Type of Therapy/Topic:  Group Therapy:  Feelings about Diagnosis  Participation Level:  Minimal   Mood: describes mood as okay   Description of Group:    This group will allow patients to explore their thoughts and feelings about diagnoses they have received. Patients will be guided to explore their level of understanding and acceptance of these diagnoses. Facilitator will encourage patients to process their thoughts and feelings about the reactions of others to their diagnosis, and will guide patients in identifying ways to discuss their diagnosis with significant others in their lives. This group will be process-oriented, with patients participating in exploration of their own experiences as well as giving and receiving support and challenge from other group members.   Therapeutic Goals: 1. Patient will demonstrate understanding of diagnosis as evidence by identifying two or more symptoms of the disorder:  2. Patient will be able to express two feelings regarding the diagnosis 3. Patient will demonstrate ability to communicate their needs through discussion and/or role plays  Summary of Patient Progress:  Pt attended and participated and able to meet the above therapeutic goals.  Continues to have flat affect and have minimal conversation.   Therapeutic Modalities:   Cognitive Behavioral Therapy Brief Therapy Feelings Identification     Dillon [name] 03/28/2017, 5:15 PM

## 2017-03-28 NOTE — Plan of Care (Signed)
Problem: Coping: Goal: Ability to verbalize frustrations and anger appropriately will improve Outcome: Progressing Patient verbalized frustration and anger to staff.    

## 2017-03-28 NOTE — Progress Notes (Signed)
Physical Therapy Treatment Patient Details Name: Dillon [name] MRN: [id number] DOB: [birth year] Today's Date: 03/28/2017    History of Present Illness Pt is a 66 y.o. male presenting to hospital d/t refusal of medications, not eating/drinking, and passive suicidal thoughts.  Pt s/p ECT 03/24/17.  PMH includes h/o recurrent depression and OCD.    PT Comments    Pt in bed stating "I'm trying to take a nap" but agreed to session with min encouragement.  Pt stated he prefers to use wheelchair as he feels safer in it.  Agreed to gait.  He was able to ambulate 220' with walker and supervision/min guard.  Overall did well but was fatigued at end of session.  He declined further activity and returned to supine.  Pt encouraged to ambulate with nursing staff to/from activities with nursing to increase strength and maintain mobility.   Follow Up Recommendations  Home health PT     Equipment Recommendations  Rolling walker with 5" wheels    Recommendations for Other Services       Precautions / Restrictions Precautions Precautions: Fall Restrictions Weight Bearing Restrictions: No    Mobility  Bed Mobility Overal bed mobility: Independent             General bed mobility comments: Supine to/from sit without difficulties; bed flat  Transfers Overall transfer level: Needs assistance Equipment used: Rolling walker (2 wheeled) Transfers: Sit to/from Stand Sit to Stand: Supervision;Min guard         General transfer comment: mild increased effort to stand from low bed but steady without loss of balance  Ambulation/Gait Ambulation/Gait assistance: Min guard;Supervision Ambulation Distance (Feet): 220 Feet Assistive device: Rolling walker (2 wheeled) Gait Pattern/deviations: Step-through pattern Gait velocity: mildly decreased   General Gait Details: pt steady ambulating with RW   Stairs            Wheelchair Mobility    Modified Rankin (Stroke Patients Only)        Balance Overall balance assessment: Needs assistance Sitting-balance support: No upper extremity supported;Feet supported Sitting balance-Leahy Scale: Normal     Standing balance support: Bilateral upper extremity supported;During functional activity Standing balance-Leahy Scale: Good Standing balance comment: ambulating with use of RW                            Cognition Arousal/Alertness: Awake/alert Behavior During Therapy: Flat affect Overall Cognitive Status: Within Functional Limits for tasks assessed                                        Exercises      General Comments        Pertinent Vitals/Pain Pain Assessment: No/denies pain    Home Living                      Prior Function            PT Goals (current goals can now be found in the care plan section) Progress towards PT goals: Progressing toward goals    Frequency    Min 2X/week      PT Plan Current plan remains appropriate    Co-evaluation              AM-PAC PT "6 Clicks" Daily Activity  Outcome Measure  Difficulty turning over in bed (including adjusting bedclothes,  sheets and blankets)?: None Difficulty moving from lying on back to sitting on the side of the bed? : None Difficulty sitting down on and standing up from a chair with arms (e.g., wheelchair, bedside commode, etc,.)?: A Little Help needed moving to and from a bed to chair (including a wheelchair)?: A Little Help needed walking in hospital room?: A Little Help needed climbing 3-5 steps with a railing? : A Little 6 Click Score: 20    End of Session Equipment Utilized During Treatment: Gait belt Activity Tolerance: Patient tolerated treatment well Patient left: in bed         Time: 0910-0923 PT Time Calculation (min) (ACUTE ONLY): 13 min  Charges:  $Gait Training: 8-22 mins                    G Codes:       Dillon [name], PTA 03/28/17, 10:38 AM

## 2017-03-28 NOTE — Progress Notes (Signed)
Patient ID: Dillon [name], male   DOB: 10/30/1950, 66 y.o.   MRN: [id number]  CSW attempted phone call to Pt's daughter Dillon [name] at [phone] and left voicemail.  Also attempted call to Pt's son, Dillon [name] at [phone], a Child answered and then hung up, unable to leave message.  Will ask CSW to follow up tomorrow.  [name]  Slater Mcmanaman, LCSW

## 2017-03-28 NOTE — [facility] Group Notes (Signed)
[facility] Group Notes:  (Nursing/MHT/Case Management/Adjunct)  Date:  03/28/2017  Time:  1:36 PM  Type of Therapy:  Psychoeducational Skills  Participation Level:  Did Not Attend  Mahogany Torrance Travis Zakiyah Diop 03/28/2017, 1:36 PM 

## 2017-03-29 ENCOUNTER — Inpatient Hospital Stay: Payer: BLUE CROSS/BLUE SHIELD | Admitting: Anesthesiology

## 2017-03-29 ENCOUNTER — Other Ambulatory Visit: Payer: Self-pay | Admitting: Psychiatry

## 2017-03-29 ENCOUNTER — Inpatient Hospital Stay (HOSPITAL_COMMUNITY)
Admission: AD | Admit: 2017-03-29 | Discharge: 2017-03-29 | Disposition: A | Discharge status: Home / Self Care | Payer: BLUE CROSS/BLUE SHIELD | Source: Intra-hospital | Attending: Psychiatry | Admitting: Psychiatry

## 2017-03-29 DIAGNOSIS — F332 Major depressive disorder, recurrent severe without psychotic features: Secondary | ICD-10-CM

## 2017-03-29 LAB — GLUCOSE, CAPILLARY: Glucose-Capillary: 101 mg/dL — ABNORMAL HIGH (ref 65–99)

## 2017-03-29 MED ORDER — SUCCINYLCHOLINE CHLORIDE 20 MG/ML IJ SOLN
INTRAMUSCULAR | Status: AC
  Filled 2017-03-29: qty 1

## 2017-03-29 MED ORDER — LABETALOL HCL 5 MG/ML IV SOLN
INTRAVENOUS | Status: AC
  Filled 2017-03-29: qty 4

## 2017-03-29 MED ORDER — SODIUM CHLORIDE 0.9 % IV SOLN
500.0000 mL | Freq: Once | INTRAVENOUS | Status: AC
  Administered 2017-03-29: 500 mL via INTRAVENOUS

## 2017-03-29 MED ORDER — KETOROLAC TROMETHAMINE 30 MG/ML IJ SOLN
30.0000 mg | Freq: Once | INTRAMUSCULAR | Status: AC
  Administered 2017-03-29: 30 mg via INTRAVENOUS

## 2017-03-29 MED ORDER — KETOROLAC TROMETHAMINE 30 MG/ML IJ SOLN
INTRAMUSCULAR | Status: AC
  Filled 2017-03-29: qty 1

## 2017-03-29 MED ORDER — ESMOLOL HCL 100 MG/10ML IV SOLN
INTRAVENOUS | Status: DC | PRN
  Administered 2017-03-29: 10 mg via INTRAVENOUS

## 2017-03-29 MED ORDER — LABETALOL HCL 5 MG/ML IV SOLN
INTRAVENOUS | Status: DC | PRN
  Administered 2017-03-29: 20 mg via INTRAVENOUS

## 2017-03-29 MED ORDER — METHOHEXITAL SODIUM 100 MG/10ML IV SOSY
PREFILLED_SYRINGE | INTRAVENOUS | Status: DC | PRN
  Administered 2017-03-29: 70 mg via INTRAVENOUS

## 2017-03-29 MED ORDER — SUCCINYLCHOLINE CHLORIDE 200 MG/10ML IV SOSY
PREFILLED_SYRINGE | INTRAVENOUS | Status: DC | PRN
  Administered 2017-03-29: 80 mg via INTRAVENOUS

## 2017-03-29 MED ORDER — ESMOLOL HCL 100 MG/10ML IV SOLN
INTRAVENOUS | Status: AC
  Filled 2017-03-29: qty 10

## 2017-03-29 MED ORDER — SODIUM CHLORIDE 0.9 % IV SOLN
INTRAVENOUS | Status: DC | PRN
  Administered 2017-03-29: 11:00:00 via INTRAVENOUS

## 2017-03-29 NOTE — Procedures (Signed)
ECT SERVICES Physician's Interval Evaluation & Treatment Note  Patient Identification: Dillon BlacksmithRobert Henry [name] MRN:  [id number] Date of Evaluation:  03/29/2017 TX #: 3  MADRS:   MMSE:   P.E. Findings:  Still has some orthostasis. Lungs and heart clear vitals otherwise unremarkable  Psychiatric Interval Note:  Minimal complaints but he is also very flat and withdrawn  Subjective:  Patient is a 66 y.o. male seen for evaluation for Electroconvulsive Therapy. Anxious depressed low energy  Treatment Summary:   [x]   Right Unilateral             []  Bilateral   % Energy : 0.3 ms 70%   Impedance: 1160 ohms  Seizure Energy Index: 4436 V squared  Postictal Suppression Index: 95%  Seizure Concordance Index: 84%  Medications  Pre Shock: Labetalol 20 mg Toradol 30 mg Brevital 70 mg succinylcholine 80 mg  Post Shock:    Seizure Duration: 17 seconds by EMG 42 seconds by EEG   Comments: Follow-up Friday   Lungs:  [x]   Clear to auscultation               []  Other:   Heart:    [x]   Regular rhythm             []  irregular rhythm    [x]   Previous H&P reviewed, patient examined and there are NO CHANGES                 []   Previous H&P reviewed, patient examined and there are changes noted.   Dillon RasmussenJohn Ermal Haberer, MD 6/13/201811:04 AM

## 2017-03-29 NOTE — Transfer of Care (Signed)
Immediate Anesthesia Transfer of Care Note  Patient: Dillon [name]  Procedure(s) Performed: ECT  Patient Location: PACU  Anesthesia Type:General  Level of Consciousness: sedated  Airway & Oxygen Therapy: Patient Spontanous Breathing and Patient connected to face mask oxygen  Post-op Assessment: Report given to RN and Post -op Vital signs reviewed and stable  Post vital signs: Reviewed and stable  Last Vitals:  Vitals:   03/29/17 0908 03/29/17 1126  BP: 131/83 (!) (P) 121/92  Pulse: 62 73  Resp: 16 16  Temp: 36.4 C 36.7 C    Last Pain:  Vitals:   03/29/17 1126  TempSrc:   PainSc: (P) 0-No pain         Complications: No apparent anesthesia complications

## 2017-03-29 NOTE — Plan of Care (Signed)
Problem: Health Behavior/Discharge Planning: Goal: Compliance with therapeutic regimen will improve Outcome: Progressing Compliant with medication and treatment plan

## 2017-03-29 NOTE — Progress Notes (Signed)
[facility] MD Progress Note  03/29/2017 7:52 PM Dillon [name]  MRN:  [id number]  Subjective:  03/25/2017. Dillon [name] is very depressed and utterly hopeless. He has no energy to get out of bed. Following his last hospitalization he did well for a while but stopped taking medication. He lost over 30 lbs. He is unable to tolerate Ensure. Accepts medication, reports no side effects. He is unable to ambulate independently.  03/26/2017. Dillon [name] reports feeling slightly better but refuses to get up for lunch. He uses a wheel chair. He is despondent and utterly hopeless. Awaiting PT consult.   Follow-up Monday the 11th. Dillon [name] had ECT today. Treatment tolerated well. This afternoon he says he is subjectively feeling a little better but he remains in bed with his affect blunted and withdrawn. Still not eating or drinking adequately.  Follow-up Tuesday the 12th. Dillon [name] seen this morning. He was awake and interactive but as usual answers questions as briefly as possible. Minimal physical activity. Still not eating and drinking adequately. Denied suicidal ideation. Admitted to still having some obsessive worry  Follow-up Wednesday the 13th. Dillon [name] had ECT again today which once again was tolerated well. He has no new complaints but his mental status remains about the same. Spends most of his time in bed. Not eating and drinking very well. I did notice that this evening he was up and was visiting appropriately with his son. Not reporting any suicidal ideation but still appears very slow and limited in his thoughts and ability  Per nursing: D:Pt denies SI/HI/AVH. Pt is pleasant and cooperative. Pt stated he was tired, pt was encouraged to stay in the bed, and asked to call for assistance when ambulating, pt was told not to ambulate without staff present. Pt still resistant to eating / drinking even though encouragement provided .   A:Pt was offered support and encouragement. Pt was given scheduled medications.  Pt was encourage to attend groups. Q 15 minute checks were done for safety.   R:safety maintained on unit.  Principal Problem: Recurrent major depression-severe (HCC) Diagnosis:   Dillon [name] Active Problem List   Diagnosis Date Noted  . Recurrent major depression-severe (HCC) [F33.2] 03/22/2017  . Hospital discharge follow-up [Z09] 02/03/2016  . Suicidal ideation [R45.851] 01/04/2016  . Severe recurrent major depression without psychotic features (HCC) [F33.2] 12/15/2015  . Grief [F43.20] 12/15/2015  . Weight loss [R63.4] 12/15/2015  . OCD (obsessive compulsive disorder) [F42.9] 12/15/2015  . Benign prostatic hyperplasia with urinary obstruction [N40.1, N13.8] 11/24/2015  . 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  Past Medical History:  Past Medical History:  Diagnosis Date  . Kidney stones   . Meningitis    spinal  . OCD (obsessive compulsive disorder)     Past Surgical History:  Procedure Laterality Date  . KIDNEY STONE SURGERY     Family History:  Family History  Problem Relation Age of Onset  . Cancer Mother        bladder  . Emphysema Father   . Emphysema Sister   . COPD Sister   . Cancer Brother        prostate   Family Psychiatric  History: see H&P. Social History:  History  Alcohol Use No     History  Drug Use No    Social History   Social History  . Marital status: Widowed    Spouse name: N/A  . Number of  children: N/A  . Years of education: N/A   Social History Main Topics  . Smoking status: Never Smoker  . Smokeless tobacco: Never Used  . Alcohol use No  . Drug use: No  . Sexual activity: Not Currently   Other Topics Concern  . None   Social History Narrative  . None   Additional Social History:                         Sleep: Fair  Appetite:  Poor  Current Medications: Current Facility-Administered Medications   Medication Dose Route Frequency Provider Last Rate Last Dose  . acetaminophen (TYLENOL) tablet 650 mg  650 mg Oral Q6H PRN Paysen Goza T, MD      . alum & mag hydroxide-simeth (MAALOX/MYLANTA) 200-200-20 MG/5ML suspension 30 mL  30 mL Oral Q4H PRN Jackson Fetters T, MD      . fluvoxaMINE (LUVOX) tablet 200 mg  200 mg Oral QHS Pucilowska, Jolanta B, MD   200 mg at 03/28/17 2255  . ketorolac (TORADOL) 30 MG/ML injection           . magnesium hydroxide (MILK OF MAGNESIA) suspension 30 mL  30 mL Oral Daily PRN Ione Sandusky T, MD      . OLANZapine (ZYPREXA) tablet 5 mg  5 mg Oral QHS Brynlyn Dade T, MD   5 mg at 03/28/17 2255  . tamsulosin (FLOMAX) capsule 0.4 mg  0.4 mg Oral BID AC Rhianna Raulerson T, MD   0.4 mg at 03/29/17 1815    Lab Results:  Results for orders placed or performed during the hospital encounter of 03/22/17 (from the past 48 hour(s))  Glucose, capillary     Status: Abnormal   Collection Time: 03/28/17  6:34 AM  Result Value Ref Range   Glucose-Capillary 102 (H) 65 - 99 mg/dL  Glucose, capillary     Status: Abnormal   Collection Time: 03/29/17  6:52 AM  Result Value Ref Range   Glucose-Capillary 101 (H) 65 - 99 mg/dL    Blood Alcohol level:  Lab Results  Component Value Date   ETH <5 01/04/2016   ETH <5 12/15/2015    Metabolic Disorder Labs: Lab Results  Component Value Date   HGBA1C 4.8 03/27/2017   MPG 91 03/27/2017   Lab Results  Component Value Date   PROLACTIN 29.7 (H) 01/05/2016   Lab Results  Component Value Date   CHOL 137 03/27/2017   TRIG 117 03/27/2017   HDL 30 (L) 03/27/2017   CHOLHDL 4.6 03/27/2017   VLDL 23 03/27/2017   LDLCALC 84 03/27/2017   LDLCALC 79 01/05/2016    Physical Findings: AIMS: Facial and Oral Movements Muscles of Facial Expression: Minimal Lips and Perioral Area: None, normal Jaw: None, normal Tongue: None, normal,Extremity Movements Upper (arms, wrists, hands, fingers): None, normal Lower (legs, knees, ankles,  toes): None, normal, Trunk Movements Neck, shoulders, hips: None, normal, Overall Severity Severity of abnormal movements (highest score from questions above): None, normal Incapacitation due to abnormal movements: None, normal Dillon [name]'s awareness of abnormal movements (rate only Dillon [name]'s report): No Awareness, Dental Status Current problems with teeth and/or dentures?: No Does Dillon [name] usually wear dentures?: No  CIWA:    COWS:     Musculoskeletal: Strength & Muscle Tone: within normal limits Gait & Station: normal Dillon [name] leans: N/A  Psychiatric Specialty Exam: Physical Exam  Nursing note and vitals reviewed. Psychiatric: His affect is blunt. His speech is delayed. He is slowed  and withdrawn. Cognition and memory are normal. He expresses impulsivity. He exhibits a depressed mood. He expresses suicidal ideation. He expresses suicidal plans.    Review of Systems  Constitutional: Positive for malaise/fatigue and weight loss.  Neurological: Positive for weakness.  Psychiatric/Behavioral: Positive for depression and suicidal ideas. The Dillon [name] is nervous/anxious and has insomnia.   All other systems reviewed and are negative.   Blood pressure 107/61, pulse 98, temperature 98.4 F (36.9 C), resp. rate 16, height 5\' 9"  (1.753 m), weight 79.4 kg (175 lb), SpO2 92 %.Body mass index is 25.84 kg/m.  General Appearance: Disheveled  Eye Contact:  Minimal  Speech:  Slow and Slurred  Volume:  Decreased  Mood:  Depressed  Affect:  Blunt  Thought Process:  Goal Directed and Descriptions of Associations: Intact  Orientation:  Full (Time, Place, and Person)  Thought Content:  WDL  Suicidal Thoughts:  Yes.  with intent/plan  Homicidal Thoughts:  No  Memory:  Immediate;   Fair Recent;   Fair Remote;   Fair  Judgement:  Impaired  Insight:  Lacking  Psychomotor Activity:  Psychomotor Retardation  Concentration:  Concentration: Fair and Attention Span: Fair  Recall:  [employer] of  Knowledge:  Fair  Language:  Fair  Akathisia:  No  Handed:  Right  AIMS (if indicated):     Assets:  Communication Skills Desire for Improvement Financial Resources/Insurance Housing Resilience Social Support  ADL's:  Intact  Cognition:  WNL  Sleep:  Number of Hours: 6.75     Treatment Plan Summary: Daily contact with Dillon [name] to assess and evaluate symptoms and progress in treatment and Medication management   Mr. Grater is a 66 year old male with a history of depression and anxiety admitted for ECT.  1. Suicidal ideation. The Dillon [name] is able to contract for safety in the hospital.  2. Mood and anxiety. We continued Abilify and Luvox along with ECT.  3. BPH. He is on Flomax.  4. Metabolic syndrome monitoring. Lipid panel, TSH and HgbA1C are pending.  5. EKG. Pending.  6. Deconditioning. PT input is greatly appreciated.   7. Disposition. He will be discharged to home.  03/25/2017. We increased Lovox to 200 mg and ordered PT conusult. 03/26/2017. Ordered labs and EKG.  Gradual improvement but still not at his baseline. Continue current medication. Continue ECT next treatment scheduled for Friday. Continue encouragement to daily group participation.  [name], MD 03/29/2017, 7:52 PM

## 2017-03-29 NOTE — Anesthesia Procedure Notes (Signed)
Date/Time: 03/29/2017 11:13 AM Performed by: Lily KocherPERALTA, Dillon Juba Pre-anesthesia Checklist: Patient identified, Emergency Drugs available, Suction available and Patient being monitored Patient Re-evaluated:Patient Re-evaluated prior to inductionOxygen Delivery Method: Circle system utilized Preoxygenation: Pre-oxygenation with 100% oxygen Intubation Type: IV induction Ventilation: Mask ventilation without difficulty and Mask ventilation throughout procedure Airway Equipment and Method: Bite block Placement Confirmation: positive ETCO2 Dental Injury: Teeth and Oropharynx as per pre-operative assessment

## 2017-03-29 NOTE — Plan of Care (Signed)
Problem: Coping: Goal: Ability to verbalize frustrations and anger appropriately will improve Outcome: Progressing Patient verbalized feelings to staff.    

## 2017-03-29 NOTE — [facility] Group Notes (Signed)
[facility] LCSW Group Therapy  03/29/2017 1:47 PM  Type of Therapy:  Group Therapy  Participation Level:  Minimal  Participation Quality:  Attentive  Affect:  Appropriate  Cognitive:  Alert  Insight:  Limited  Engagement in Therapy:  Improving  Modes of Intervention:  Activity, Discussion, Education, Problem-solving, Reality Testing, Socialization and Support  Summary of Progress/Problems: Emotional Regulation: Patients will identify both negative and positive emotions. They will discuss emotions they have difficulty regulating and how they impact their lives. Patients will be asked to identify healthy coping skills to combat unhealthy reactions to negative emotions.   Dillon [name] MSW, LCSWA 03/29/2017, 1:49 PM

## 2017-03-29 NOTE — Anesthesia Postprocedure Evaluation (Signed)
Anesthesia Post Note  Patient: Dillon BlacksmithRobert Henry [name]  Procedure(s) Performed: * No procedures listed *  Patient location during evaluation: PACU Anesthesia Type: General Level of consciousness: awake Pain management: pain level controlled Vital Signs Assessment: post-procedure vital signs reviewed and stable Respiratory status: spontaneous breathing Cardiovascular status: stable Anesthetic complications: no     Last Vitals:  Vitals:   03/29/17 1126 03/29/17 1142  BP: (!) 121/92 113/77  Pulse: 67 62  Resp:    Temp: 36.7 C     Last Pain:  Vitals:   03/29/17 1126  TempSrc:   PainSc: 0-No pain                 VAN STAVEREN,Roi Jafari

## 2017-03-29 NOTE — Anesthesia Post-op Follow-up Note (Cosign Needed)
Anesthesia QCDR form completed.        

## 2017-03-29 NOTE — Anesthesia Preprocedure Evaluation (Signed)
Anesthesia Evaluation  Patient identified by MRN, date of birth, ID band Patient awake    Reviewed: Allergy & Precautions, NPO status , Patient's Chart, lab work & pertinent test results  Airway Mallampati: III       Dental  (+) Teeth Intact   Pulmonary neg pulmonary ROS,    breath sounds clear to auscultation       Cardiovascular Exercise Tolerance: Good  Rhythm:Regular     Neuro/Psych Depression    GI/Hepatic negative GI ROS, Neg liver ROS,   Endo/Other  negative endocrine ROS  Renal/GU      Musculoskeletal negative musculoskeletal ROS (+)   Abdominal Normal abdominal exam  (+)   Peds negative pediatric ROS (+)  Hematology negative hematology ROS (+)   Anesthesia Other Findings   Reproductive/Obstetrics                             Anesthesia Physical Anesthesia Plan  ASA: II  Anesthesia Plan: General   Post-op Pain Management:    Induction: Intravenous  PONV Risk Score and Plan: 0  Airway Management Planned: Mask and Natural Airway  Additional Equipment:   Intra-op Plan:   Post-operative Plan:   Informed Consent: I have reviewed the patients History and Physical, chart, labs and discussed the procedure including the risks, benefits and alternatives for the proposed anesthesia with the patient or authorized representative who has indicated his/her understanding and acceptance.     Plan Discussed with: CRNA  Anesthesia Plan Comments:         Anesthesia Quick Evaluation

## 2017-03-29 NOTE — Progress Notes (Signed)
Recreation Therapy Notes  Date: 06.13.18 Time: 9:30 am Location: Craft Room  Group Topic: Self-esteem  Goal Area(s) Addresses:  Patient will write at least one positive trait about self. Patient will verbalize benefit of having a healthy self-esteem.  Behavioral Response: Did not attend  Intervention: I Am  Activity: Patients were given a worksheet with the letter I on it and were instructed to write as many positive traits inside the letter.  Education: LRT educated patients on ways to increase their self-esteem.  Education Outcome: Patient did not attend group.  Clinical Observations/Feedback: Patient did not attend group.  Jacquelynn CreeGreene,Adilee Lemme M, LRT/CTRS 03/29/2017 10:09 AM

## 2017-03-29 NOTE — H&P (Signed)
Dillon [name] is an 66 y.o. male.   Chief Complaint: Patient has no specific complaints but is presented to the hospital with lack of appetite dizziness poor by mouth intake poor energy depressed mood and hopelessness and anxiety HPI: History of recurrent depression and OCD with a history of noncompliance with treatment  Past Medical History:  Diagnosis Date  . Kidney stones   . Meningitis    spinal  . OCD (obsessive compulsive disorder)     Past Surgical History:  Procedure Laterality Date  . KIDNEY STONE SURGERY      Family History  Problem Relation Age of Onset  . Cancer Mother        bladder  . Emphysema Father   . Emphysema Sister   . COPD Sister   . Cancer Brother        prostate   Social History:  reports that he has never smoked. He has never used smokeless tobacco. He reports that he does not drink alcohol or use drugs.  Allergies:  Allergies  Allergen Reactions  . Codeine Other (See Comments)    Unsure of reaction     (Not in a hospital admission)  Results for orders placed or performed during the hospital encounter of 03/22/17 (from the past 48 hour(s))  Glucose, capillary     Status: Abnormal   Collection Time: 03/28/17  6:34 AM  Result Value Ref Range   Glucose-Capillary 102 (H) 65 - 99 mg/dL  Glucose, capillary     Status: Abnormal   Collection Time: 03/29/17  6:52 AM  Result Value Ref Range   Glucose-Capillary 101 (H) 65 - 99 mg/dL   No results found.  Review of Systems  Constitutional: Positive for weight loss.  HENT: Negative.   Eyes: Negative.   Respiratory: Negative.   Cardiovascular: Negative.   Gastrointestinal: Negative.   Musculoskeletal: Negative.   Skin: Negative.   Neurological: Positive for dizziness and weakness.  Psychiatric/Behavioral: Positive for depression and memory loss. Negative for hallucinations, substance abuse and suicidal ideas. The patient is not nervous/anxious and does not have insomnia.     Blood  pressure 131/83, pulse 62, temperature 97.6 F (36.4 C), temperature source Oral, resp. rate 16, height 5\' 9"  (1.753 m), weight 80.3 kg (177 lb), SpO2 98 %. Physical Exam  Nursing note and vitals reviewed. Constitutional: He appears well-developed and well-nourished.  HENT:  Head: Normocephalic and atraumatic.  Eyes: Conjunctivae are normal. Pupils are equal, round, and reactive to light.  Neck: Normal range of motion.  Cardiovascular: Regular rhythm and normal heart sounds.   Respiratory: Effort normal. No respiratory distress.  GI: Soft.  Musculoskeletal: Normal range of motion.  Neurological: He is alert.  Skin: Skin is warm and dry.  Psychiatric: Judgment normal. His mood appears anxious. His affect is blunt. His speech is delayed. He is slowed. He exhibits a depressed mood. He expresses no suicidal ideation. He exhibits abnormal recent memory.     Assessment/Plan ECT today continue Friday continue hospital level care because of his low activity and low intake  Dillon RasmussenJohn Wilmar Prabhakar, MD 03/29/2017, 11:02 AM

## 2017-03-29 NOTE — Progress Notes (Signed)
Pt returns from ECT, assisted to bed.  No acute distress.  Food and fluids given.  VS obtained, see flowsheets.  Morning med administered.  Pt reports he groggy.  Calm, cooperative and pleasant, although is requesting to rest to unable to fully assess at this time.  Son visited for visitation.  Pt requested that his wallet be given to son so son could pay patients bills with debit card while admitted.  Wallet retrieved and given to son, documented on belongings sheet.

## 2017-03-30 ENCOUNTER — Other Ambulatory Visit: Payer: Self-pay | Admitting: Psychiatry

## 2017-03-30 ENCOUNTER — Telehealth (HOSPITAL_COMMUNITY): Payer: Self-pay | Admitting: *Deleted

## 2017-03-30 LAB — GLUCOSE, CAPILLARY: GLUCOSE-CAPILLARY: 107 mg/dL — AB (ref 65–99)

## 2017-03-30 NOTE — Progress Notes (Signed)
[facility]  03/30/2017 9:23 PM Dillon BlacksmithRobert Henry [name]  MRN:  [id number]  Subjective:  03/25/2017. Dillon [name] is very depressed and utterly hopeless. He has no energy to get out of bed. Following his last hospitalization he did well for a while but stopped taking medication. He lost over 30 lbs. He is unable to tolerate Ensure. Accepts medication, reports no side effects. He is unable to ambulate independently.  03/26/2017. Dillon [name] reports feeling slightly better but refuses to get up for lunch. He uses a wheel chair. He is despondent and utterly hopeless. Awaiting PT consult.   Follow-up Monday the 11th. Patient had ECT today. Treatment tolerated well. This afternoon he says he is subjectively feeling a little better but he remains in bed with his affect blunted and withdrawn. Still not eating or drinking adequately.  Follow-up Tuesday the 12th. Patient seen this morning. He was awake and interactive but as usual answers questions as briefly as possible. Minimal physical activity. Still not eating and drinking adequately. Denied suicidal ideation. Admitted to still having some obsessive worry  Follow-up Wednesday the 13th. Patient had ECT again today which once again was tolerated well. He has no new complaints but his mental status remains about the same. Spends most of his time in bed. Not eating and drinking very well. I did notice that this evening he was up and was visiting appropriately with his son. Not reporting any suicidal ideation but still appears very slow and limited in his thoughts and ability  Follow-up for Thursday the 14th. Patient still spends most of his time in bed although he has attended some groups and is eating and drinking better. He is able to smile and make light conversation better. He acknowledges that he is still having obsessive thoughts and worries. Denies suicidal thoughts.  Per nursing: D:Pt denies SI/HI/AVH. Pt is pleasant and cooperative. Pt stated he was  tired, pt was encouraged to stay in the bed, and asked to call for assistance when ambulating, pt was told not to ambulate without staff present. Pt still resistant to eating / drinking even though encouragement provided .   A:Pt was offered support and encouragement. Pt was given scheduled medications. Pt was encourage to attend groups. Q 15 minute checks were done for safety.   R:safety maintained on unit.  Principal Problem: Recurrent major depression-severe (HCC) Diagnosis:   Patient Active Problem List   Diagnosis Date Noted  . Recurrent major depression-severe (HCC) [F33.2] 03/22/2017  . Hospital discharge follow-up [Z09] 02/03/2016  . Suicidal ideation [R45.851] 01/04/2016  . Severe recurrent major depression without psychotic features (HCC) [F33.2] 12/15/2015  . Grief [F43.20] 12/15/2015  . Weight loss [R63.4] 12/15/2015  . OCD (obsessive compulsive disorder) [F42.9] 12/15/2015  . Benign prostatic hyperplasia with urinary obstruction [N40.1, N13.8] 11/24/2015  . 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  Past Medical History:  Past Medical History:  Diagnosis Date  . Kidney stones   . Meningitis    spinal  . OCD (obsessive compulsive disorder)     Past Surgical History:  Procedure Laterality Date  . KIDNEY STONE SURGERY     Family History:  Family History  Problem Relation Age of Onset  . Cancer Mother        bladder  . Emphysema Father   . Emphysema Sister   . COPD Sister   . Cancer Brother  prostate   Family Psychiatric  History: see H&P. Social History:  History  Alcohol Use No     History  Drug Use No    Social History   Social History  . Marital status: Widowed    Spouse name: N/A  . Number of children: N/A  . Years of education: N/A   Social History Main Topics  . Smoking status: Never Smoker  . Smokeless tobacco: Never Used  .  Alcohol use No  . Drug use: No  . Sexual activity: Not Currently   Other Topics Concern  . None   Social History Narrative  . None   Additional Social History:                         Sleep: Fair  Appetite:  Poor  Current Medications: Current Facility-Administered Medications  Medication Dose Route Frequency Provider Last Rate Last Dose  . acetaminophen (TYLENOL) tablet 650 mg  650 mg Oral Q6H PRN Butch Otterson T, MD      . alum & mag hydroxide-simeth (MAALOX/MYLANTA) 200-200-20 MG/5ML suspension 30 mL  30 mL Oral Q4H PRN Gabreal Worton T, MD      . fluvoxaMINE (LUVOX) tablet 200 mg  200 mg Oral QHS Pucilowska, Jolanta B, MD   200 mg at 03/29/17 2205  . magnesium hydroxide (MILK OF MAGNESIA) suspension 30 mL  30 mL Oral Daily PRN Teven Mittman T, MD      . OLANZapine (ZYPREXA) tablet 5 mg  5 mg Oral QHS Sebert Stollings T, MD   5 mg at 03/29/17 2206  . tamsulosin (FLOMAX) capsule 0.4 mg  0.4 mg Oral BID AC Kallan Merrick T, MD   0.4 mg at 03/30/17 1700    Lab Results:  Results for orders placed or performed during the hospital encounter of 03/22/17 (from the past 48 hour(s))  Glucose, capillary     Status: Abnormal   Collection Time: 03/29/17  6:52 AM  Result Value Ref Range   Glucose-Capillary 101 (H) 65 - 99 mg/dL    Blood Alcohol level:  Lab Results  Component Value Date   ETH <5 01/04/2016   ETH <5 12/15/2015    Metabolic Disorder Labs: Lab Results  Component Value Date   HGBA1C 4.8 03/27/2017   MPG 91 03/27/2017   Lab Results  Component Value Date   PROLACTIN 29.7 (H) 01/05/2016   Lab Results  Component Value Date   CHOL 137 03/27/2017   TRIG 117 03/27/2017   HDL 30 (L) 03/27/2017   CHOLHDL 4.6 03/27/2017   VLDL 23 03/27/2017   LDLCALC 84 03/27/2017   LDLCALC 79 01/05/2016    Physical Findings: AIMS: Facial and Oral Movements Muscles of Facial Expression: Minimal Lips and Perioral Area: None, normal Jaw: None, normal Tongue: None,  normal,Extremity Movements Upper (arms, wrists, hands, fingers): None, normal Lower (legs, knees, ankles, toes): None, normal, Trunk Movements Neck, shoulders, hips: None, normal, Overall Severity Severity of abnormal movements (highest score from questions above): None, normal Incapacitation due to abnormal movements: None, normal Patient's awareness of abnormal movements (rate only patient's report): No Awareness, Dental Status Current problems with teeth and/or dentures?: No Does patient usually wear dentures?: No  CIWA:    COWS:     Musculoskeletal: Strength & Muscle Tone: within normal limits Gait & Station: normal Patient leans: N/A  Psychiatric Specialty Exam: Physical Exam  Nursing note and vitals reviewed. Psychiatric: His affect is blunt. His speech is delayed.  He is slowed and withdrawn. Cognition and memory are normal. He expresses impulsivity. He exhibits a depressed mood. He expresses suicidal ideation. He expresses suicidal plans.    Review of Systems  Constitutional: Positive for malaise/fatigue and weight loss.  Neurological: Positive for weakness.  Psychiatric/Behavioral: Positive for depression and suicidal ideas. The patient is nervous/anxious and has insomnia.   All other systems reviewed and are negative.   Blood pressure 108/72, pulse 89, temperature 98.4 F (36.9 C), resp. rate 16, height 5\' 9"  (1.753 m), weight 79.4 kg (175 lb), SpO2 92 %.Body mass index is 25.84 kg/m.  General Appearance: Disheveled  Eye Contact:  Minimal  Speech:  Slow and Slurred  Volume:  Decreased  Mood:  Depressed  Affect:  Blunt  Thought Process:  Goal Directed and Descriptions of Associations: Intact  Orientation:  Full (Time, Place, and Person)  Thought Content:  WDL  Suicidal Thoughts:  Yes.  with intent/plan  Homicidal Thoughts:  No  Memory:  Immediate;   Fair Recent;   Fair Remote;   Fair  Judgement:  Impaired  Insight:  Lacking  Psychomotor Activity:  Psychomotor  Retardation  Concentration:  Concentration: Fair and Attention Span: Fair  Recall:  [employer] of Knowledge:  Fair  Language:  Fair  Akathisia:  No  Handed:  Right  AIMS (if indicated):     Assets:  Communication Skills Desire for Improvement Financial Resources/Insurance Housing Resilience Social Support  ADL's:  Intact  Cognition:  WNL  Sleep:  Number of Hours: 8     Treatment Plan Summary: Daily contact with patient to assess and evaluate symptoms and progress in treatment and Medication management   Dillon [name] is a 66 year old male with a history of depression and anxiety admitted for ECT.  1. Suicidal ideation. The patient is able to contract for safety in the hospital.  2. Mood and anxiety. We continued Abilify and Luvox along with ECT.  3. BPH. He is on Flomax.  4. Metabolic syndrome monitoring. Lipid panel, TSH and HgbA1C are pending.  5. EKG. Pending.  6. Deconditioning. PT input is greatly appreciated.   7. Disposition. He will be discharged to home.  03/25/2017. We increased Lovox to 200 mg and ordered PT conusult. 03/26/2017. Ordered labs and EKG.  Tolerating ECT with gradual improvement. Still unable to take care of himself completely without support. Still somewhat cognitively impaired and slowed. Cooperative however with treatment. Plan is for continued medication management at this level with ECT scheduled again for tomorrow. Encourage patient to continue participation. Situation reviewed with nursing and social work. [name], MD 03/30/2017, 9:23 PM

## 2017-03-30 NOTE — [facility] Group Notes (Signed)
[facility] LCSW Group Therapy  03/30/2017 1:44 PM  Type of Therapy:  Group Therapy  Participation Level:  Active  Participation Quality:  Attentive  Affect:  Flat  Cognitive:  Lacking  Insight:  Improving  Engagement in Therapy:  Improving  Modes of Intervention:  Activity, Discussion, Education, Problem-solving, Reality Testing, Socialization and Support  Summary of Progress/Problems: Balance in life: Patients will discuss the concept of balance and how it looks and feels to be unbalanced. Pt will identify areas in their life that is unbalanced and ways to become more balanced. They discussed what aspects in their lives has influenced their self care. Patients also discussed self care in the areas of self regulation/control, hygiene/appearance, sleep/relaxation, healthy leisure, healthy eating habits, exercise, inner peace/spirituality, self improvement, sobriety, and health management. They were challenged to identify changes that are needed in order to improve self care.   Dillon Mckendry G. Garnette CzechSampson MSW, LCSWA 03/30/2017, 1:45 PM

## 2017-03-30 NOTE — [facility] Group Notes (Signed)
Goals Group Date/Time: 03/30/2017 9:00 AM Type of Therapy and Topic: Group Therapy: Goals Group: SMART Goals   Participation Level: Moderate  Description of Group:    The purpose of a daily goals group is to assist and guide patients in setting recovery/wellness-related goals. The objective is to set goals as they relate to the crisis in which they were admitted. Patients will be using SMART goal modalities to set measurable goals. Characteristics of realistic goals will be discussed and patients will be assisted in setting and processing how one will reach their goal. Facilitator will also assist patients in applying interventions and coping skills learned in psycho-education groups to the SMART goal and process how one will achieve defined goal.   Therapeutic Goals:   -Patients will develop and document one goal related to or their crisis in which brought them into treatment.  -Patients will be guided by LCSW using SMART goal setting modality in how to set a measurable, attainable, realistic and time sensitive goal.  -Patients will process barriers in reaching goal.  -Patients will process interventions in how to overcome and successful in reaching goal.   Patient's Goal:Pt had difficulty formulating a goal.  Only able to say he wants to "focus more."   Therapeutic Modalities:  Motivational Interviewing  Cognitive Behavioral Therapy  Crisis Intervention Model  SMART goals setting   Daleen SquibbGreg Raea Magallon, LCSW

## 2017-03-30 NOTE — Progress Notes (Signed)
Recreation Therapy Notes  Date: 06.14.18 Time: 9:30 am Location: Craft Room  Group Topic: Leisure Education  Goal Area(s) Addresses:  Patient will identify things they are grateful for. Patient will identify how being grateful can influence decision making.  Behavioral Response: Attentive, Interactive  Intervention: Grateful Wheel  Activity: Patients were given an I Am Grateful For worksheet and were instructed to write things they are grateful for under each category.   Education: LRT educated patients on leisure.  Education Outcome: In group clarification offered  Clinical Observations/Feedback: Patient underlined some words and wrote on his worksheet. Patient contributed to group discussion by stating why we do not think about things we are grateful for often.  Jacquelynn CreeGreene,Jhene Westmoreland M, LRT/CTRS 03/30/2017 10:15 AM

## 2017-03-30 NOTE — [facility] Suicide Risk Assessment (Signed)
[facility] INPATIENT:  Family/Significant Other Suicide Prevention Education  Suicide Prevention Education:  Contact Attempts:Bryan Yetta BarreJones (son of patient) & Fenton FoyKelly Duval (daughter of patient), (name of family member/significant other) has been identified by the patient as the family member/significant other with whom the patient will be residing, and identified as the person(s) who will aid the patient in the event of a mental health crisis.  With written consent from the patient, two attempts were made to provide suicide prevention education, prior to and/or following the patient's discharge.  We were unsuccessful in providing suicide prevention education.  A suicide education pamphlet was given to the patient to share with family/significant other.  Dianna LimboBryan Zammit [phone]) Date and time of first attempt: 03/30/2017 / 2:19pm; No answer, CSW  left HIPAA compliant voicemail requesting returned call.  Fenton FoyKelly Musial [phone]) Date and time of first attempt: 03/30/2017 / 2:17pm; No answer, CSW  left HIPAA compliant voicemail requesting returned call.   Kailen Hinkle G. Garnette CzechSampson MSW, LCSWA 03/30/2017, 2:28 PM

## 2017-03-30 NOTE — Progress Notes (Signed)
Patient is quiet and reserved.  Denies SI/HI/AVH.  When asked about depression states "I feel okay"  Medication and group compliant.  Up to dayroom for meals otherwise stays in room in bed.  Support offered.  Safety maintained.

## 2017-03-30 NOTE — Progress Notes (Signed)
D: Pt  Passive SI denies  HI/AVH. Pt is pleasant and cooperative. Pt remained in room majority of the evening.   A: Pt was offered support and encouragement. Pt was given scheduled medications. Pt was encourage to attend groups. Q 15 minute checks were done for safety.   R:Pt attends groups and interacts well with peers and staff. Pt is taking medication. Pt has no complaints.Pt receptive to treatment and safety maintained on unit.

## 2017-03-30 NOTE — Plan of Care (Signed)
Problem: Activity: Goal: Interest or engagement in leisure activities will improve Outcome: Progressing Patient up for meals and attending groups otherwise stays in room to self.  No interaction noted with peers.

## 2017-03-30 NOTE — Plan of Care (Signed)
Problem: Safety: Goal: Periods of time without injury will increase Outcome: Progressing Pt safe on the unit at this time   

## 2017-03-30 NOTE — [facility] Group Notes (Signed)
[facility] Group Notes:  (Nursing/MHT/Case Management/Adjunct)  Date:  03/30/2017  Time:  10:00 PM  Type of Therapy:  Psychoeducational Skills  Participation Level:  Did Not Attend  Foy GuadalajaraJasmine R Jalayne Ganesh 03/30/2017, 10:00 PM

## 2017-03-31 ENCOUNTER — Telehealth (HOSPITAL_COMMUNITY): Payer: Self-pay | Admitting: *Deleted

## 2017-03-31 ENCOUNTER — Inpatient Hospital Stay: Payer: BLUE CROSS/BLUE SHIELD | Admitting: Anesthesiology

## 2017-03-31 ENCOUNTER — Inpatient Hospital Stay (HOSPITAL_COMMUNITY)
Admission: AD | Admit: 2017-03-31 | Discharge: 2017-03-31 | Disposition: A | Discharge status: Home / Self Care | Payer: BLUE CROSS/BLUE SHIELD | Source: Intra-hospital | Attending: Psychiatry | Admitting: Psychiatry

## 2017-03-31 DIAGNOSIS — F332 Major depressive disorder, recurrent severe without psychotic features: Secondary | ICD-10-CM

## 2017-03-31 LAB — GLUCOSE, CAPILLARY: GLUCOSE-CAPILLARY: 106 mg/dL — AB (ref 65–99)

## 2017-03-31 MED ORDER — LABETALOL HCL 5 MG/ML IV SOLN
INTRAVENOUS | Status: AC
  Filled 2017-03-31: qty 4

## 2017-03-31 MED ORDER — KETOROLAC TROMETHAMINE 30 MG/ML IJ SOLN
INTRAMUSCULAR | Status: AC
  Filled 2017-03-31: qty 1

## 2017-03-31 MED ORDER — KETOROLAC TROMETHAMINE 30 MG/ML IJ SOLN
30.0000 mg | Freq: Once | INTRAMUSCULAR | Status: AC
  Administered 2017-03-31: 30 mg via INTRAVENOUS

## 2017-03-31 MED ORDER — SODIUM CHLORIDE 0.9 % IV SOLN
INTRAVENOUS | Status: DC | PRN
  Administered 2017-03-31: 11:00:00 via INTRAVENOUS

## 2017-03-31 MED ORDER — LABETALOL HCL 5 MG/ML IV SOLN
INTRAVENOUS | Status: DC | PRN
  Administered 2017-03-31: 20 mg via INTRAVENOUS

## 2017-03-31 MED ORDER — SODIUM CHLORIDE 0.9 % IV SOLN
500.0000 mL | Freq: Once | INTRAVENOUS | Status: AC
  Administered 2017-03-31: 500 mL via INTRAVENOUS

## 2017-03-31 MED ORDER — SUCCINYLCHOLINE CHLORIDE 200 MG/10ML IV SOSY
PREFILLED_SYRINGE | INTRAVENOUS | Status: DC | PRN
  Administered 2017-03-31: 80 mg via INTRAVENOUS

## 2017-03-31 MED ORDER — METHOHEXITAL SODIUM 100 MG/10ML IV SOSY
PREFILLED_SYRINGE | INTRAVENOUS | Status: DC | PRN
  Administered 2017-03-31: 70 mg via INTRAVENOUS

## 2017-03-31 MED ORDER — FENTANYL CITRATE (PF) 100 MCG/2ML IJ SOLN: 25.0000 ug | INTRAMUSCULAR | Status: DC | PRN

## 2017-03-31 MED ORDER — ONDANSETRON HCL 4 MG/2ML IJ SOLN: 4.0000 mg | Freq: Once | INTRAMUSCULAR | Status: DC | PRN

## 2017-03-31 NOTE — Telephone Encounter (Signed)
Called for prior authorization of ECT. Gave additional information and received 8 sessions from 6/13-7/20/18 per Baxter KailAlina Taylor ID #[id number].

## 2017-03-31 NOTE — Anesthesia Post-op Follow-up Note (Cosign Needed)
Anesthesia QCDR form completed.        

## 2017-03-31 NOTE — Progress Notes (Signed)
Recreation Therapy Notes  Date: 06.15.18 Time: 9:30 am Location: Craft Room  Group Topic: Coping Skills  Goal Area(s) Addresses:  Patient will verbalize one emotion experienced in group. Patient will verbalize benefit of using art as a healthy coping skill.  Behavioral Response: Did not attend  Intervention: Coloring  Activity: Patients were given coloring sheets to color and were instructed to think about what emotions they were feeling and what their minds were focused on.  Education: LRT educated patients on healthy coping skills.  Education Outcome: Patient did not attend group.  Clinical Observations/Feedback: Patient did not attend group.  Breella Vanostrand M, LRT/CTRS 03/31/2017 10:23 AM 

## 2017-03-31 NOTE — Procedures (Signed)
ECT SERVICES Physician's Interval Evaluation & Treatment Note  Patient Identification: Dillon [name] MRN:  [id number] Date of Evaluation:  03/31/2017 TX #: 4  MADRS:   MMSE:   P.E. Findings:  No change to physical exam heart and lungs normal  Psychiatric Interval Note:  Mood slightly better affect better energy a little better less anxious  Subjective:  Patient is a 66 y.o. male seen for evaluation for Electroconvulsive Therapy. No specific complaints  Treatment Summary:   [x]   Right Unilateral             []  Bilateral   % Energy : 0.3 ms 70%   Impedance: 1880 ohms  Seizure Energy Index: 4772 V squared  Postictal Suppression Index: 37%  Seizure Concordance Index: No reading  Medications  Pre Shock: Labetalol 20 mg Toradol 30 mg Brevital 70 mg succinylcholine 80 mg  Post Shock:    Seizure Duration: 18 seconds by EMG 44 seconds by EEG   Comments: Follow-up next treatment Monday continue current medicine. Patient still not well enough for discharge   Lungs:  [x]   Clear to auscultation               []  Other:   Heart:    [x]   Regular rhythm             []  irregular rhythm    [x]   Previous H&P reviewed, patient examined and there are NO CHANGES                 []   Previous H&P reviewed, patient examined and there are changes noted.   Mordecai RasmussenJohn Clapacs, MD 6/15/201811:36 AM

## 2017-03-31 NOTE — [facility] Group Notes (Signed)
[facility] LCSW Group Therapy Note  Date/Time: 03/31/17, 1300  Type of Therapy and Topic:  Group Therapy:  Feelings around Relapse and Recovery  Participation Level:  Active   Mood:pleasant  Description of Group:    Patients in this group will discuss emotions they experience before and after a relapse. They will process how experiencing these feelings, or avoidance of experiencing them, relates to having a relapse. Facilitator will guide patients to explore emotions they have related to recovery. Patients will be encouraged to process which emotions are more powerful. They will be guided to discuss the emotional reaction significant others in their lives may have to patients' relapse or recovery. Patients will be assisted in exploring ways to respond to the emotions of others without this contributing to a relapse.  Therapeutic Goals: 1. Patient will identify two or more emotions that lead to relapse for them:  2. Patient will identify two emotions that result when they relapse:  3. Patient will identify two emotions related to recovery:  4. Patient will demonstrate ability to communicate their needs through discussion and/or role plays.   Summary of Patient Progress: Pt continues to report trouble with memory.  Pt did share that he does not have issues with drugs/alcohol/relapse.  Pt shared about the loss of his wife and how he tried to deal with those feelings but again had trouble with recall.     Therapeutic Modalities:   Cognitive Behavioral Therapy Solution-Focused Therapy Assertiveness Training Relapse Prevention Therapy  Daleen SquibbGreg Kirbi Farrugia, LCSW

## 2017-03-31 NOTE — Progress Notes (Signed)
PT Cancellation Note  Patient Details Name: Dillon [name] MRN: [id number] DOB: [birth year]   Cancelled Treatment:    Reason Eval/Treat Not Completed: Other (comment).  Pt s/p ECT today and nursing reporting pt resting in bed and with c/o being dizzy earlier.  Per discussion with nursing, will hold PT at this time and re-attempt PT treatment at a later date/time.  [name], PT 03/31/17, 4:00 PM [phone]

## 2017-03-31 NOTE — Progress Notes (Signed)
[facility]  03/31/2017 2:46 PM Burman BlacksmithRobert Henry Sacco  MRN:  [id number]  Subjective:  03/25/2017. [name] is very depressed and utterly hopeless. He has no energy to get out of bed. Following his last hospitalization he did well for a while but stopped taking medication. He lost over 30 lbs. He is unable to tolerate Ensure. Accepts medication, reports no side effects. He is unable to ambulate independently.  03/26/2017. [name] reports feeling slightly better but refuses to get up for lunch. He uses a wheel chair. He is despondent and utterly hopeless. Awaiting PT consult.   Follow-up Monday the 11th. Patient had ECT today. Treatment tolerated well. This afternoon he says he is subjectively feeling a little better but he remains in bed with his affect blunted and withdrawn. Still not eating or drinking adequately.  Follow-up Tuesday the 12th. Patient seen this morning. He was awake and interactive but as usual answers questions as briefly as possible. Minimal physical activity. Still not eating and drinking adequately. Denied suicidal ideation. Admitted to still having some obsessive worry  Follow-up Wednesday the 13th. Patient had ECT again today which once again was tolerated well. He has no new complaints but his mental status remains about the same. Spends most of his time in bed. Not eating and drinking very well. I did notice that this evening he was up and was visiting appropriately with his son. Not reporting any suicidal ideation but still appears very slow and limited in his thoughts and ability  Follow-up for Thursday the 14th. Patient still spends most of his time in bed although he has attended some groups and is eating and drinking better. He is able to smile and make light conversation better. He acknowledges that he is still having obsessive thoughts and worries. Denies suicidal thoughts.  Follow-up Friday the 15th. Patient had ECT today once again well tolerated without  complications other than a small bite on his lip. Patient reports that his mood continues to improve. Energy level is still low and he also has noticed some short-term memory loss but is not showing signs of delirium.  Per nursing: D:Pt denies SI/HI/AVH. Pt is pleasant and cooperative. Pt stated he was tired, pt was encouraged to stay in the bed, and asked to call for assistance when ambulating, pt was told not to ambulate without staff present. Pt still resistant to eating / drinking even though encouragement provided .   A:Pt was offered support and encouragement. Pt was given scheduled medications. Pt was encourage to attend groups. Q 15 minute checks were done for safety.   R:safety maintained on unit.  Principal Problem: Recurrent major depression-severe (HCC) Diagnosis:   Patient Active Problem List   Diagnosis Date Noted  . Recurrent major depression-severe (HCC) [F33.2] 03/22/2017  . Hospital discharge follow-up [Z09] 02/03/2016  . Suicidal ideation [R45.851] 01/04/2016  . Severe recurrent major depression without psychotic features (HCC) [F33.2] 12/15/2015  . Grief [F43.20] 12/15/2015  . Weight loss [R63.4] 12/15/2015  . OCD (obsessive compulsive disorder) [F42.9] 12/15/2015  . Benign prostatic hyperplasia with urinary obstruction [N40.1, N13.8] 11/24/2015  . 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  Past Medical History:  Past Medical History:  Diagnosis Date  . Kidney stones   . Meningitis    spinal  . OCD (obsessive compulsive disorder)     Past Surgical History:  Procedure Laterality Date  . KIDNEY STONE SURGERY  Family History:  Family History  Problem Relation Age of Onset  . Cancer Mother        bladder  . Emphysema Father   . Emphysema Sister   . COPD Sister   . Cancer Brother        prostate   Family Psychiatric  History: see H&P. Social  History:  History  Alcohol Use No     History  Drug Use No    Social History   Social History  . Marital status: Widowed    Spouse name: N/A  . Number of children: N/A  . Years of education: N/A   Social History Main Topics  . Smoking status: Never Smoker  . Smokeless tobacco: Never Used  . Alcohol use No  . Drug use: No  . Sexual activity: Not Currently   Other Topics Concern  . None   Social History Narrative  . None   Additional Social History:                         Sleep: Fair  Appetite:  Poor  Current Medications: Current Facility-Administered Medications  Medication Dose Route Frequency Provider Last Rate Last Dose  . acetaminophen (TYLENOL) tablet 650 mg  650 mg Oral Q6H PRN Nuria Phebus T, MD      . alum & mag hydroxide-simeth (MAALOX/MYLANTA) 200-200-20 MG/5ML suspension 30 mL  30 mL Oral Q4H PRN Camie Hauss T, MD      . fluvoxaMINE (LUVOX) tablet 200 mg  200 mg Oral QHS Pucilowska, Jolanta B, MD   200 mg at 03/30/17 2117  . ketorolac (TORADOL) 30 MG/ML injection           . magnesium hydroxide (MILK OF MAGNESIA) suspension 30 mL  30 mL Oral Daily PRN Kielyn Kardell T, MD      . OLANZapine (ZYPREXA) tablet 5 mg  5 mg Oral QHS Eros Montour, Jackquline Denmark, MD   5 mg at 03/30/17 2117  . tamsulosin (FLOMAX) capsule 0.4 mg  0.4 mg Oral BID AC Malcolm Hetz T, MD   0.4 mg at 03/31/17 1302   Facility-Administered Medications Ordered in Other Encounters  Medication Dose Route Frequency Provider Last Rate Last Dose  . fentaNYL (SUBLIMAZE) injection 25 mcg  25 mcg Intravenous Q5 min PRN Yves Dill, MD      . ondansetron [facility]) injection 4 mg  4 mg Intravenous Once PRN Yves Dill, MD        Lab Results:  Results for orders placed or performed during the hospital encounter of 03/22/17 (from the past 48 hour(s))  Glucose, capillary     Status: Abnormal   Collection Time: 03/31/17  6:45 AM  Result Value Ref Range   Glucose-Capillary 106 (H) 65 - 99 mg/dL    Comment 1 Notify RN     Blood Alcohol level:  Lab Results  Component Value Date   ETH <5 01/04/2016   ETH <5 12/15/2015    Metabolic Disorder Labs: Lab Results  Component Value Date   HGBA1C 4.8 03/27/2017   MPG 91 03/27/2017   Lab Results  Component Value Date   PROLACTIN 29.7 (H) 01/05/2016   Lab Results  Component Value Date   CHOL 137 03/27/2017   TRIG 117 03/27/2017   HDL 30 (L) 03/27/2017   CHOLHDL 4.6 03/27/2017   VLDL 23 03/27/2017   LDLCALC 84 03/27/2017   LDLCALC 79 01/05/2016    Physical Findings: AIMS: Facial and Oral Movements  Muscles of Facial Expression: Minimal Lips and Perioral Area: None, normal Jaw: None, normal Tongue: None, normal,Extremity Movements Upper (arms, wrists, hands, fingers): None, normal Lower (legs, knees, ankles, toes): None, normal, Trunk Movements Neck, shoulders, hips: None, normal, Overall Severity Severity of abnormal movements (highest score from questions above): None, normal Incapacitation due to abnormal movements: None, normal Patient's awareness of abnormal movements (rate only patient's report): No Awareness, Dental Status Current problems with teeth and/or dentures?: No Does patient usually wear dentures?: No  CIWA:    COWS:     Musculoskeletal: Strength & Muscle Tone: within normal limits Gait & Station: normal Patient leans: N/A  Psychiatric Specialty Exam: Physical Exam  Nursing note and vitals reviewed. Psychiatric: His affect is blunt. His speech is delayed. He is slowed and withdrawn. Cognition and memory are normal. He expresses impulsivity. He exhibits a depressed mood. He expresses suicidal ideation. He expresses suicidal plans.    Review of Systems  Constitutional: Positive for malaise/fatigue and weight loss.  Neurological: Positive for weakness.  Psychiatric/Behavioral: Positive for depression and suicidal ideas. The patient is nervous/anxious and has insomnia.   All other systems reviewed  and are negative.   Blood pressure 111/69, pulse 97, temperature 98 F (36.7 C), temperature source Oral, resp. rate 17, height 5\' 9"  (1.753 m), weight 79.4 kg (175 lb), SpO2 100 %.Body mass index is 25.84 kg/m.  General Appearance: Disheveled  Eye Contact:  Minimal  Speech:  Slow and Slurred  Volume:  Decreased  Mood:  Depressed  Affect:  Blunt  Thought Process:  Goal Directed and Descriptions of Associations: Intact  Orientation:  Full (Time, Place, and Person)  Thought Content:  WDL  Suicidal Thoughts:  Yes.  with intent/plan  Homicidal Thoughts:  No  Memory:  Immediate;   Fair Recent;   Fair Remote;   Fair  Judgement:  Impaired  Insight:  Lacking  Psychomotor Activity:  Psychomotor Retardation  Concentration:  Concentration: Fair and Attention Span: Fair  Recall:  [employer] of Knowledge:  Fair  Language:  Fair  Akathisia:  No  Handed:  Right  AIMS (if indicated):     Assets:  Communication Skills Desire for Improvement Financial Resources/Insurance Housing Resilience Social Support  ADL's:  Intact  Cognition:  WNL  Sleep:  Number of Hours: 8     Treatment Plan Summary: Daily contact with patient to assess and evaluate symptoms and progress in treatment and Medication management   Mr. Akhtar is a 66 year old male with a history of depression and anxiety admitted for ECT.  1. Suicidal ideation. The patient is able to contract for safety in the hospital.  2. Mood and anxiety. We continued Abilify and Luvox along with ECT.  3. BPH. He is on Flomax.  4. Metabolic syndrome monitoring. Lipid panel, TSH and HgbA1C are pending.  5. EKG. Pending.  6. Deconditioning. PT input is greatly appreciated.   7. Disposition. He will be discharged to home.  03/25/2017. We increased Lovox to 200 mg and ordered PT conusult. 03/26/2017. Ordered labs and EKG.  Case reviewed was treatment team. Updated treatment team plan. Patient will continue with ECT as well as his current  medication with encouragement to be more physically active he didn't drink better and to improve his strength. Goal is to try to get him back to a physical and mental condition near his baseline before discharge. Estimated length of stay still probably in the neighborhood of a week. [name], MD 03/31/2017, 2:46 PM

## 2017-03-31 NOTE — Progress Notes (Signed)
Pt reported " I am feeling better". Pt continues to be isolative to room resting in bed. Pt reports feeling tired. Pt tolerated ECT well this morning. Pt denies SI, HI, a/v hallucinations. He commits to safety on unit. Will continue to monitor for safety.

## 2017-03-31 NOTE — Anesthesia Preprocedure Evaluation (Signed)
Anesthesia Evaluation  Patient identified by MRN, date of birth, ID band Patient awake    Reviewed: Allergy & Precautions, NPO status , Patient's Chart, lab work & pertinent test results  Airway Mallampati: III       Dental  (+) Teeth Intact   Pulmonary neg pulmonary ROS,    breath sounds clear to auscultation       Cardiovascular Exercise Tolerance: Good  Rhythm:Regular     Neuro/Psych PSYCHIATRIC DISORDERS Depression    GI/Hepatic negative GI ROS, Neg liver ROS,   Endo/Other  negative endocrine ROS  Renal/GU stones     Musculoskeletal negative musculoskeletal ROS (+)   Abdominal Normal abdominal exam  (+)   Peds negative pediatric ROS (+)  Hematology negative hematology ROS (+)   Anesthesia Other Findings   Reproductive/Obstetrics                             Anesthesia Physical  Anesthesia Plan  ASA: II  Anesthesia Plan: General   Post-op Pain Management:    Induction: Intravenous  PONV Risk Score and Plan: 0  Airway Management Planned: Mask and Natural Airway  Additional Equipment:   Intra-op Plan:   Post-operative Plan:   Informed Consent: I have reviewed the patients History and Physical, chart, labs and discussed the procedure including the risks, benefits and alternatives for the proposed anesthesia with the patient or authorized representative who has indicated his/her understanding and acceptance.     Plan Discussed with: CRNA  Anesthesia Plan Comments:         Anesthesia Quick Evaluation

## 2017-03-31 NOTE — H&P (Signed)
Dillon [name] is an 66 y.o. male.   Chief Complaint: No new complaint. Still low energy not eating very much but getting better mood improving. HPI: History of recurrent severe depression and OCD  Past Medical History:  Diagnosis Date  . Kidney stones   . Meningitis    spinal  . OCD (obsessive compulsive disorder)     Past Surgical History:  Procedure Laterality Date  . KIDNEY STONE SURGERY      Family History  Problem Relation Age of Onset  . Cancer Mother        bladder  . Emphysema Father   . Emphysema Sister   . COPD Sister   . Cancer Brother        prostate   Social History:  reports that he has never smoked. He has never used smokeless tobacco. He reports that he does not drink alcohol or use drugs.  Allergies:  Allergies  Allergen Reactions  . Codeine Other (See Comments)    Unsure of reaction     (Not in a hospital admission)  Results for orders placed or performed during the hospital encounter of 03/29/17 (from the past 48 hour(s))  Glucose, capillary     Status: Abnormal   Collection Time: 03/29/17  8:45 PM  Result Value Ref Range   Glucose-Capillary 107 (H) 65 - 99 mg/dL   Comment 1 Notify RN    No results found.  Review of Systems  Constitutional: Negative.   HENT: Negative.   Eyes: Negative.   Respiratory: Negative.   Cardiovascular: Negative.   Gastrointestinal: Negative.   Musculoskeletal: Negative.   Skin: Negative.   Neurological: Negative.   Psychiatric/Behavioral: Positive for depression and memory loss. Negative for hallucinations, substance abuse and suicidal ideas. The patient is nervous/anxious. The patient does not have insomnia.     Blood pressure 128/80, pulse 77, temperature 98.7 F (37.1 C), temperature source Oral, resp. rate 16, height 5\' 9"  (1.753 m), weight 80.7 kg (178 lb), SpO2 95 %. Physical Exam  Nursing note and vitals reviewed. Constitutional: He appears well-developed and well-nourished.  HENT:  Head:  Normocephalic and atraumatic.  Eyes: Conjunctivae are normal. Pupils are equal, round, and reactive to light.  Neck: Normal range of motion.  Cardiovascular: Regular rhythm and normal heart sounds.   Respiratory: Effort normal and breath sounds normal. No respiratory distress.  GI: Soft.  Musculoskeletal: Normal range of motion.  Neurological: He is alert.  Skin: Skin is warm and dry.  Psychiatric: Judgment normal. His affect is blunt. His speech is delayed. He is slowed. Thought content is not paranoid. He expresses no homicidal and no suicidal ideation. He exhibits abnormal recent memory.     Assessment/Plan Treatment today follow-up 3 times a week next week continue current medicine continue encouragement of energy and activity to demonstrate ability at self-care  Mordecai RasmussenJohn Clapacs, MD 03/31/2017, 11:35 AM

## 2017-03-31 NOTE — Anesthesia Procedure Notes (Signed)
Performed by: Emersyn Kotarski Pre-anesthesia Checklist: Patient identified, Emergency Drugs available, Suction available and Patient being monitored Patient Re-evaluated:Patient Re-evaluated prior to inductionOxygen Delivery Method: Circle system utilized Preoxygenation: Pre-oxygenation with 100% oxygen Intubation Type: IV induction Ventilation: Mask ventilation without difficulty and Mask ventilation throughout procedure Airway Equipment and Method: Bite block Placement Confirmation: positive ETCO2 Dental Injury: Teeth and Oropharynx as per pre-operative assessment        

## 2017-03-31 NOTE — [facility] Group Notes (Signed)
[facility] Group Notes:  (Nursing/MHT/Case Management/Adjunct)  Date:  03/31/2017  Time:  3:56 PM  Type of Therapy:  Psychoeducational Skills  Participation Level:  Did Not Attend    Dillon [name] 03/31/2017, 3:56 PM

## 2017-03-31 NOTE — Anesthesia Postprocedure Evaluation (Signed)
Anesthesia Post Note  Patient: Dillon [name]  Procedure(s) Performed: * No procedures listed *  Patient location during evaluation: PACU Anesthesia Type: General Level of consciousness: awake and alert and oriented Pain management: pain level controlled Vital Signs Assessment: post-procedure vital signs reviewed and stable Respiratory status: spontaneous breathing Cardiovascular status: blood pressure returned to baseline Anesthetic complications: no     Last Vitals:  Vitals:   03/31/17 1215 03/31/17 1230  BP: 119/74 120/83  Pulse: (!) 56 (!) 58  Resp: 16 16  Temp:  36.4 C    Last Pain:  Vitals:   03/31/17 1230  TempSrc:   PainSc: 0-No pain                 Zurisadai Helminiak

## 2017-03-31 NOTE — Transfer of Care (Addendum)
Immediate Anesthesia Transfer of Care Note  Patient: Dillon [name]  Procedure(s) Performed: ECT  Patient Location: PACU  Anesthesia Type:General  Level of Consciousness: sedated  Airway & Oxygen Therapy: Patient Spontanous Breathing  Post-op Assessment: Report given to RN  Post vital signs: Reviewed and stable  Last Vitals:  Vitals:   03/31/17 0851 03/31/17 1156  BP: 128/80 (P) 108/60  Pulse: 77 (P) 62  Resp: 16 (P) 16  Temp: 37.1 C (P) 36.3 C    Last Pain:  Vitals:   03/31/17 0851  TempSrc: Oral         Complications: No apparent anesthesia complications

## 2017-03-31 NOTE — Tx Team (Signed)
Interdisciplinary Treatment and Diagnostic Plan Update  03/31/2017 Time of Session: 1:45pm.  Dillon [name] MRN: [id number]  Principal Diagnosis: Recurrent major depression-severe [facility])  Secondary Diagnoses: Principal Problem:   Recurrent major depression-severe (HCC) Active Problems:   Benign prostatic hyperplasia with urinary obstruction   OCD (obsessive compulsive disorder)   Current Medications:  Current Facility-Administered Medications  Medication Dose Route Frequency Provider Last Rate Last Dose  . acetaminophen (TYLENOL) tablet 650 mg  650 mg Oral Q6H PRN Clapacs, John T, MD      . alum & mag hydroxide-simeth (MAALOX/MYLANTA) 200-200-20 MG/5ML suspension 30 mL  30 mL Oral Q4H PRN Clapacs, John T, MD      . fluvoxaMINE (LUVOX) tablet 200 mg  200 mg Oral QHS Pucilowska, Jolanta B, MD   200 mg at 03/30/17 2117  . ketorolac (TORADOL) 30 MG/ML injection           . magnesium hydroxide (MILK OF MAGNESIA) suspension 30 mL  30 mL Oral Daily PRN Clapacs, John T, MD      . OLANZapine (ZYPREXA) tablet 5 mg  5 mg Oral QHS Clapacs, Jackquline Denmark, MD   5 mg at 03/30/17 2117  . tamsulosin (FLOMAX) capsule 0.4 mg  0.4 mg Oral BID AC Clapacs, John T, MD   0.4 mg at 03/31/17 1302   Facility-Administered Medications Ordered in Other Encounters  Medication Dose Route Frequency Provider Last Rate Last Dose  . fentaNYL (SUBLIMAZE) injection 25 mcg  25 mcg Intravenous Q5 min PRN Yves Dill, MD      . ondansetron [facility]) injection 4 mg  4 mg Intravenous Once PRN Yves Dill, MD       PTA Medications: Prescriptions Prior to Admission  Medication Sig Dispense Refill Last Dose  . fluvoxaMINE (LUVOX) 100 MG tablet TAKE ONE TABLET BY MOUTH AT BEDTIME 30 tablet 0   . OLANZapine zydis (ZYPREXA) 5 MG disintegrating tablet Take 1 tablet (5 mg total) by mouth at bedtime. 30 tablet 1 Taking  . tamsulosin (FLOMAX) 0.4 MG CAPS capsule Take 1 capsule (0.4 mg total) by mouth daily after supper. (Patient  taking differently: Take 0.4 mg by mouth 2 (two) times daily. ) 30 capsule 1 Taking  . traZODone (DESYREL) 50 MG tablet Take 1 tablet (50 mg total) by mouth at bedtime as needed for sleep. 30 tablet 1 Taking    Patient Stressors: Loss of wife of 44 years approximately 2 years ago Medication change or noncompliance  Patient Strengths: Average or above average intelligence Capable of independent living Supportive family/friends  Treatment Modalities: Medication Management, Group therapy, Case management,  1 to 1 session with clinician, Psychoeducation, Recreational therapy.   Physician Treatment Plan for Primary Diagnosis: Recurrent major depression-severe (HCC) Long Term Goal(s): Improvement in symptoms so as ready for discharge Improvement in symptoms so as ready for discharge   Short Term Goals: Compliance with prescribed medications will improve Ability to identify triggers associated with substance abuse/mental health issues will improve Ability to disclose and discuss suicidal ideas Ability to demonstrate self-control will improve  Medication Management: Evaluate patient's response, side effects, and tolerance of medication regimen.  Therapeutic Interventions: 1 to 1 sessions, Unit Group sessions and Medication administration.  Evaluation of Outcomes: Progressing  Physician Treatment Plan for Secondary Diagnosis: Principal Problem:   Recurrent major depression-severe (HCC) Active Problems:   Benign prostatic hyperplasia with urinary obstruction   OCD (obsessive compulsive disorder)  Long Term Goal(s): Improvement in symptoms so as ready for discharge Improvement in symptoms  so as ready for discharge   Short Term Goals: Compliance with prescribed medications will improve Ability to identify triggers associated with substance abuse/mental health issues will improve Ability to disclose and discuss suicidal ideas Ability to demonstrate self-control will improve      Medication Management: Evaluate patient's response, side effects, and tolerance of medication regimen.  Therapeutic Interventions: 1 to 1 sessions, Unit Group sessions and Medication administration.  Evaluation of Outcomes: Progressing   RN Treatment Plan for Primary Diagnosis: Recurrent major depression-severe (HCC) Long Term Goal(s): Knowledge of disease and therapeutic regimen to maintain health will improve  Short Term Goals: Ability to verbalize feelings will improve, Ability to disclose and discuss suicidal ideas, Ability to identify and develop effective coping behaviors will improve and Compliance with prescribed medications will improve  Medication Management: RN will administer medications as ordered by provider, will assess and evaluate patient's response and provide education to patient for prescribed medication. RN will report any adverse and/or side effects to prescribing provider.  Therapeutic Interventions: 1 on 1 counseling sessions, Psychoeducation, Medication administration, Evaluate responses to treatment, Monitor vital signs and CBGs as ordered, Perform/monitor CIWA, COWS, AIMS and Fall Risk screenings as ordered, Perform wound care treatments as ordered.  Evaluation of Outcomes: Progressing   LCSW Treatment Plan for Primary Diagnosis: Recurrent major depression-severe (HCC) Long Term Goal(s): Safe transition to appropriate next level of care at discharge, Engage patient in therapeutic group addressing interpersonal concerns.  Short Term Goals: Engage patient in aftercare planning with referrals and resources and Increase skills for wellness and recovery  Therapeutic Interventions: Assess for all discharge needs, 1 to 1 time with Social worker, Explore available resources and support systems, Assess for adequacy in community support network, Educate family and significant other(s) on suicide prevention, Complete Psychosocial Assessment, Interpersonal group  therapy.  Evaluation of Outcomes: Progressing   Progress in Treatment: Attending groups: Yes. Participating in groups: Yes. Taking medication as prescribed: Yes. Toleration medication: Yes. Family/Significant other contact made: No, will contact:  either son or daughter Patient understands diagnosis: Yes. Discussing patient identified problems/goals with staff: Yes. Medical problems stabilized or resolved: Yes. Denies suicidal/homicidal ideation: Yes. Issues/concerns per patient self-inventory: No. Other: n/a  New problem(s) identified: None identified at this time.   New Short Term/Long Term Goal(s): None identified at this time.   Discharge Plan or Barriers: CSW still assessing appropriate discharge plan.   Reason for Continuation of Hospitalization: Anxiety Depression Medical Issues Medication stabilization Other; describe Patient is still not eating or sleeping regularly.   Estimated Length of Stay: 7 days.   Attendees: Patient: 03/31/2017 3:02 PM  Physician: Audery AmelJohn T. Clapacs.  03/31/2017 3:02 PM  Nursing: Leonia ReaderPhyllis Cobb, RN 03/31/2017 3:02 PM  RN Care Manager: 03/31/2017 3:02 PM  Social Worker: Fredrich BirksAmaris G. Garnette CzechSampson MSW, LCSWA 03/31/2017 3:02 PM  Recreational Therapist:  03/31/2017 3:02 PM  Other:  03/31/2017 3:02 PM  Other:  03/31/2017 3:02 PM  Other: 03/31/2017 3:02 PM    Scribe for Treatment Team: Arelia LongestAmaris G Sumit Branham, LCSWA 03/31/2017 3:09 PM

## 2017-04-01 NOTE — Plan of Care (Signed)
Problem: Safety: Goal: Ability to identify and utilize support systems that promote safety will improve Outcome: Progressing Patient reports "some confusion" on approach.  He identified that he "could not recall where I left my wallet".  Chart check revealed that Patient gave his wallet to his son on admission and this was shared with him.  Patient stated he could not "recall the phone number for accessing the bank to confirm my monthly deposits".  He was encouraged to discuss this with SW or family members.  He denied pain or muscle soreness and stated "being forgetful is my only concern".

## 2017-04-01 NOTE — Progress Notes (Signed)
[facility] MD Progress Note  04/01/2017 12:55 PM Dillon [name]  MRN:  [id number]  Subjective:  03/25/2017. Dillon [name] is very depressed and utterly hopeless. He has no energy to get out of bed. Following his last hospitalization he did well for a while but stopped taking medication. He lost over 30 lbs. He is unable to tolerate Ensure. Accepts medication, reports no side effects. He is unable to ambulate independently.  03/26/2017. Dillon [name] reports feeling slightly better but refuses to get up for lunch. He uses a wheel chair. He is despondent and utterly hopeless. Awaiting PT consult.   Follow-up Monday the 11th. Patient had ECT today. Treatment tolerated well. This afternoon he says he is subjectively feeling a little better but he remains in bed with his affect blunted and withdrawn. Still not eating or drinking adequately.  Follow-up Tuesday the 12th. Patient seen this morning. He was awake and interactive but as usual answers questions as briefly as possible. Minimal physical activity. Still not eating and drinking adequately. Denied suicidal ideation. Admitted to still having some obsessive worry  Follow-up Wednesday the 13th. Patient had ECT again today which once again was tolerated well. He has no new complaints but his mental status remains about the same. Spends most of his time in bed. Not eating and drinking very well. I did notice that this evening he was up and was visiting appropriately with his son. Not reporting any suicidal ideation but still appears very slow and limited in his thoughts and ability  Follow-up for Thursday the 14th. Patient still spends most of his time in bed although he has attended some groups and is eating and drinking better. He is able to smile and make light conversation better. He acknowledges that he is still having obsessive thoughts and worries. Denies suicidal thoughts.  Follow-up Friday the 15th. Patient had ECT today once again well tolerated without  complications other than a small bite on his lip. Patient reports that his mood continues to improve. Energy level is still low and he also has noticed some short-term memory loss but is not showing signs of delirium.   04/01/17  The patient had ECT yesterday and was having some problems with short-term memory after the ECT. Otherwise, he tolerated the procedure fairly well. He does admit to depressive symptoms related to being widowed and his wife passing 2 years ago. He was married for over 44 years. He has been fairly isolative to his room and does not interact a lot with other patients says that his mood is slowly improving. He denies any current auditory or visual hallucinations but does report some psychosis prior to admission. He denies any current active or passive suicidal thoughts despite the depression. The patient is ambulating with a walker. He was unable to participate with PT yesterday secondary to the ECT procedure. He denies any somatic complaints. He slept over 9 hours last night. Vital signs are stable. Appetite is fairly good. He was encouraged to try and interact more with staff and peers Supportive psychotherapy provided and Times spent discussing grief and loss. Times spent discussing ways to honor his wife's memory.   Principal Problem: Recurrent major depression-severe (HCC) Diagnosis:   Patient Active Problem List   Diagnosis Date Noted  . Recurrent major depression-severe (HCC) [F33.2] 03/22/2017  . Hospital discharge follow-up [Z09] 02/03/2016  . Suicidal ideation [R45.851] 01/04/2016  . Severe recurrent major depression without psychotic features (HCC) [F33.2] 12/15/2015  . Grief [F43.20] 12/15/2015  . Weight loss [  R63.4] 12/15/2015  . OCD (obsessive compulsive disorder) [F42.9] 12/15/2015  . Benign prostatic hyperplasia with urinary obstruction [N40.1, N13.8] 11/24/2015  . 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  Past Medical History:  Past Medical History:  Diagnosis Date  . Kidney stones   . Meningitis    spinal  . OCD (obsessive compulsive disorder)     Past Surgical History:  Procedure Laterality Date  . KIDNEY STONE SURGERY     Family History:  Family History  Problem Relation Age of Onset  . Cancer Mother        bladder  . Emphysema Father   . Emphysema Sister   . COPD Sister   . Cancer Brother        prostate   Family Psychiatric  History: see H&P. Social History:  History  Alcohol Use No     History  Drug Use No    Social History   Social History  . Marital status: Widowed    Spouse name: N/A  . Number of children: N/A  . Years of education: N/A   Social History Main Topics  . Smoking status: Never Smoker  . Smokeless tobacco: Never Used  . Alcohol use No  . Drug use: No  . Sexual activity: Not Currently   Other Topics Concern  . None   Social History Narrative  . None      Sleep: Good  Appetite:  Fair  Current Medications: Current Facility-Administered Medications  Medication Dose Route Frequency Provider Last Rate Last Dose  . acetaminophen (TYLENOL) tablet 650 mg  650 mg Oral Q6H PRN Clapacs, John T, MD      . alum & mag hydroxide-simeth (MAALOX/MYLANTA) 200-200-20 MG/5ML suspension 30 mL  30 mL Oral Q4H PRN Clapacs, John T, MD      . fluvoxaMINE (LUVOX) tablet 200 mg  200 mg Oral QHS Pucilowska, Jolanta B, MD   200 mg at 03/31/17 2123  . magnesium hydroxide (MILK OF MAGNESIA) suspension 30 mL  30 mL Oral Daily PRN Clapacs, John T, MD      . OLANZapine (ZYPREXA) tablet 5 mg  5 mg Oral QHS Clapacs, Jackquline Denmark, MD   5 mg at 03/31/17 2123  . tamsulosin (FLOMAX) capsule 0.4 mg  0.4 mg Oral BID AC Clapacs, John T, MD   0.4 mg at 04/01/17 1610    Lab Results:  Results for orders placed or performed during the hospital encounter of 03/22/17 (from the past 48 hour(s))  Glucose, capillary      Status: Abnormal   Collection Time: 03/31/17  6:45 AM  Result Value Ref Range   Glucose-Capillary 106 (H) 65 - 99 mg/dL   Comment 1 Notify RN     Blood Alcohol level:  Lab Results  Component Value Date   ETH <5 01/04/2016   ETH <5 12/15/2015    Metabolic Disorder Labs: Lab Results  Component Value Date   HGBA1C 4.8 03/27/2017   MPG 91 03/27/2017   Lab Results  Component Value Date   PROLACTIN 29.7 (H) 01/05/2016   Lab Results  Component Value Date   CHOL 137 03/27/2017   TRIG 117 03/27/2017   HDL 30 (L) 03/27/2017   CHOLHDL 4.6 03/27/2017   VLDL 23 03/27/2017   LDLCALC 84 03/27/2017   LDLCALC 79 01/05/2016    Physical Findings: AIMS: Facial and Oral Movements Muscles of Facial Expression: Minimal Lips  and Perioral Area: None, normal Jaw: None, normal Tongue: None, normal,Extremity Movements Upper (arms, wrists, hands, fingers): None, normal Lower (legs, knees, ankles, toes): None, normal, Trunk Movements Neck, shoulders, hips: None, normal, Overall Severity Severity of abnormal movements (highest score from questions above): None, normal Incapacitation due to abnormal movements: None, normal Patient's awareness of abnormal movements (rate only patient's report): No Awareness, Dental Status Current problems with teeth and/or dentures?: No Does patient usually wear dentures?: No  CIWA:    COWS:     Musculoskeletal: Strength & Muscle Tone: within normal limits Gait & Station: unsteady, Uses a walker Patient leans: N/A  Psychiatric Specialty Exam: Physical Exam  Nursing note and vitals reviewed. Psychiatric: His affect is blunt. His speech is delayed. He is slowed and withdrawn. Cognition and memory are normal. He expresses impulsivity. He exhibits a depressed mood. He expresses suicidal ideation. He expresses suicidal plans.    Review of Systems  Constitutional: Positive for malaise/fatigue and weight loss.  HENT: Negative.  Negative for hearing loss and  tinnitus.   Eyes: Negative.  Negative for blurred vision and double vision.  Respiratory: Negative.  Negative for cough and shortness of breath.   Cardiovascular: Negative.  Negative for chest pain and palpitations.  Gastrointestinal: Negative.  Negative for heartburn, nausea and vomiting.  Genitourinary: Negative.  Negative for dysuria and urgency.  Musculoskeletal: Negative.  Negative for back pain, joint pain, myalgias and neck pain.  Skin: Negative.  Negative for rash.  Neurological: Positive for weakness. Negative for dizziness, tremors, speech change, seizures, loss of consciousness and headaches.       Difficulty ambulating and unstable gait. He is using a walker. He is also having some problems with short-term memory surrounding ECT.  Endo/Heme/Allergies: Negative.   Psychiatric/Behavioral: Positive for depression and suicidal ideas. The patient is nervous/anxious and has insomnia.     Blood pressure 122/74, pulse 82, temperature 97.9 F (36.6 C), temperature source Oral, resp. rate 17, height 5\' 9"  (1.753 m), weight 79.4 kg (175 lb), SpO2 100 %.Body mass index is 25.84 kg/m.  General Appearance: Disheveled  Eye Contact:  Minimal  Speech:  Slow and Slurred  Volume:  Decreased  Mood:  Depressed  Affect:  Blunt  Thought Process:  Goal Directed and Descriptions of Associations: Intact  Orientation:  Full (Time, Place, and Person)  Thought Content:  WDL  Suicidal Thoughts:  No  Homicidal Thoughts:  No  Memory:  Immediate;   Fair Recent;   Fair Remote;   Fair  Judgement:  Impaired  Insight:  Lacking  Psychomotor Activity:  Psychomotor Retardation  Concentration:  Concentration: Fair and Attention Span: Fair  Recall:  [employer]Fair  Fund of Knowledge:  Fair  Language:  Fair  Akathisia:  No  Handed:  Right  AIMS (if indicated):     Assets:  Communication Skills Desire for Improvement Financial Resources/Insurance Housing Resilience Social Support  ADL's:  Intact  Cognition:   WNL  Sleep:  Number of Hours: 9     Treatment Plan Summary: Daily contact with patient to assess and evaluate symptoms and progress in treatment and Medication management   Dillon [name] is a 66 year old male with a history of depression and anxiety admitted for ECT.  1. Suicidal ideation. The patient is able to contract for safety in the hospital.  2. Mood and anxiety. We continued Zyprexa 5 mg by mouth nightly and Luvox 200 mg by mouth daily for depression and anxiety. The patient will continue with ECT treatment on  Monday.   3. BPH. He is on Flomax.  4. Metabolic syndrome monitoring total cholesterol was 137 and hemoglobin A1c was 4.8   5. Completed prior to ECT. No QTC prolongation.   6. Deconditioning. PT input is greatly appreciated. Currently, the patient is ambulating with a walker.  7. Disposition. He will be discharged to home.  03/25/2017. We increased Lovox to 200 mg and ordered PT conusult. 03/26/2017. Ordered labs and EKG.  Case reviewed was treatment team. Updated treatment team plan. Patient will continue with ECT as well as his current medication with encouragement to be more physically active he didn't drink better and to improve his strength. Goal is to try to get him back to a physical and mental condition near his baseline before discharge. Estimated length of stay still probably in the neighborhood of a week.  Levora Angel, MD 04/01/2017, 12:55 PM

## 2017-04-01 NOTE — [facility] Group Notes (Signed)
[facility] Group Notes:  (Nursing/MHT/Case Management/Adjunct)  Date:  04/01/2017  Time:  5:53 AM  Type of Therapy:  Psychoeducational Skills  Participation Level:  Did Not Attend  Chancy MilroyLaquanda Y Godfrey Tritschler 04/01/2017, 5:53 AM

## 2017-04-01 NOTE — [facility] Group Notes (Signed)
[facility] LCSW Group Therapy  04/01/2017 2:29 PM  Type of Therapy:  Group Therapy  Participation Level:  Minimal  Participation Quality:  Attentive  Affect:  Appropriate  Cognitive:  Alert  Insight:  Improving  Engagement in Therapy:  Engaged  Modes of Intervention:  Clarification, Discussion, Exploration, Reality Testing, Socialization and Support  Summary of Progress/Problems: Coping Skills: Patients defined and discussed healthy coping skills. Patients identified healthy coping skills they would like to try during hospitalization and after discharge. CSW offered insight to varying coping skills that may have been new to patients such as practicing mindfulness.  Coretta Leisey G. Garnette CzechSampson MSW, LCSWA 04/01/2017, 2:29 PM

## 2017-04-01 NOTE — Progress Notes (Signed)
RN and staff encouraged pt to participate in unit therapy.Pt was isolative withdrawn to bed this morning. RN told pt to get up and participate in group therapy. Pt denies SI, HI, a/v hallucinations. Pt reports mood is better. Pt commits to safety.Will continue to monitor for safety.

## 2017-04-01 NOTE — Plan of Care (Signed)
Problem: Activity: Goal: Interest or engagement in activities will improve Outcome: Not Progressing Pt is withdrawn/isolative to room.

## 2017-04-02 NOTE — [facility] Suicide Risk Assessment (Signed)
[facility] INPATIENT:  Family/Significant Other Suicide Prevention Education  Suicide Prevention Education:  Education Completed: Tresa EndoKelly Wey(daughter [phone]), has been identified by the patient as the family member/significant other with whom the patient will be residing, and identified as the person(s) who will aid the patient in the event of a mental health crisis (suicidal ideations/suicide attempt).  With written consent from the patient, the family member/significant other has been provided the following suicide prevention education, prior to the and/or following the discharge of the patient.  The suicide prevention education provided includes the following:  Suicide risk factors  Suicide prevention and interventions  National Suicide Hotline telephone number  [facility] assessment telephone number  [facility] Assistance 911  [facility] and/or Residential Mobile Crisis Unit telephone number  Request made of family/significant other to:  Remove weapons (e.g., guns, rifles, knives), all items previously/currently identified as safety concern.    Remove drugs/medications (over-the-counter, prescriptions, illicit drugs), all items previously/currently identified as a safety concern.  The family member/significant other verbalizes understanding of the suicide prevention education information provided.  The family member/significant other agrees to remove the items of safety concern listed above.  Abagale Boulos G. Garnette CzechSampson MSW, LCSWA 04/02/2017, 10:12 AM

## 2017-04-02 NOTE — [facility] Group Notes (Signed)
[facility] Group Notes:  (Nursing/MHT/Case Management/Adjunct)  Date:  04/02/2017  Time:  10:58 PM  Type of Therapy:  Evening Wrap-up Group  Participation Level:  Did Not Attend  Participation Quality:  N/A  Affect:  N/A  Cognitive:  N/A  Insight:  None  Engagement in Group:  Did Not Attend  Modes of Intervention:  Activity and Discussion  Summary of Progress/Problems:  Tomasita MorrowChelsea Nanta Davin Archuletta 04/02/2017, 10:58 PM

## 2017-04-02 NOTE — Progress Notes (Signed)
[facility] MD Progress Note  04/02/2017 3:04 PM Bronco Mcgrory  MRN:  [id number]  Subjective:  03/25/2017. Mr. Burruss is very depressed and utterly hopeless. He has no energy to get out of bed. Following his last hospitalization he did well for a while but stopped taking medication. He lost over 30 lbs. He is unable to tolerate Ensure. Accepts medication, reports no side effects. He is unable to ambulate independently.  03/26/2017. Mr. Dubey reports feeling slightly better but refuses to get up for lunch. He uses a wheel chair. He is despondent and utterly hopeless. Awaiting PT consult.   Follow-up Monday the 11th. Patient had ECT today. Treatment tolerated well. This afternoon he says he is subjectively feeling a little better but he remains in bed with his affect blunted and withdrawn. Still not eating or drinking adequately.  Follow-up Tuesday the 12th. Patient seen this morning. He was awake and interactive but as usual answers questions as briefly as possible. Minimal physical activity. Still not eating and drinking adequately. Denied suicidal ideation. Admitted to still having some obsessive worry  Follow-up Wednesday the 13th. Patient had ECT again today which once again was tolerated well. He has no new complaints but his mental status remains about the same. Spends most of his time in bed. Not eating and drinking very well. I did notice that this evening he was up and was visiting appropriately with his son. Not reporting any suicidal ideation but still appears very slow and limited in his thoughts and ability  Follow-up for Thursday the 14th. Patient still spends most of his time in bed although he has attended some groups and is eating and drinking better. He is able to smile and make light conversation better. He acknowledges that he is still having obsessive thoughts and worries. Denies suicidal thoughts.  Follow-up Friday the 15th. Patient had ECT today once again well tolerated without  complications other than a small bite on his lip. Patient reports that his mood continues to improve. Energy level is still low and he also has noticed some short-term memory loss but is not showing signs of delirium.   04/01/17 The patient had ECT yesterday and was having some problems with short-term memory after the ECT. Otherwise, he tolerated the procedure fairly well. He does admit to depressive symptoms related to being widowed and his wife passing 2 years ago. He was married for over 44 years. He has been fairly isolative to his room and does not interact a lot with other patients says that his mood is slowly improving. He denies any current auditory or visual hallucinations but does report some psychosis prior to admission. He denies any current active or passive suicidal thoughts despite the depression. The patient is ambulating with a walker. He was unable to participate with PT yesterday secondary to the ECT procedure. He denies any somatic complaints. He slept over 9 hours last night. Vital signs are stable. Appetite is fairly good. He was encouraged to try and interact more with staff and peers  Supportive psychotherapy provided and time spent discussing grief and loss. Times spent discussing ways to honor his wife's memory.  04/03/17 The patient continues to be agitated and withdrawn to his room but was more interactive with this Clinical research associate today. He was talking more spontaneously and speech was not is delayed. He has had some short-term memory problems and ECT but otherwise no side effects. He did sleep well last night, over 8 hours. Appetite is fairly good and he does  eat his meals. He has not been attending any groups. So far, he feels like his mood is slowly improving. He denies any current active or passive suicidal thoughts. The patient denies any current auditory or visual hallucinations. No paranoid thoughts or delusions. He denies any somatic complaints.   Supportive psychotherapy  provided and time spent discussing ways to honor his wife's memory.   Principal Problem: Recurrent major depression-severe (HCC) Diagnosis:   Patient Active Problem List   Diagnosis Date Noted  . Recurrent major depression-severe (HCC) [F33.2] 03/22/2017  . Hospital discharge follow-up [Z09] 02/03/2016  . Suicidal ideation [R45.851] 01/04/2016  . Severe recurrent major depression without psychotic features (HCC) [F33.2] 12/15/2015  . Grief [F43.20] 12/15/2015  . Weight loss [R63.4] 12/15/2015  . OCD (obsessive compulsive disorder) [F42.9] 12/15/2015  . Benign prostatic hyperplasia with urinary obstruction [N40.1, N13.8] 11/24/2015  . 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  Past Medical History:  Past Medical History:  Diagnosis Date  . Kidney stones   . Meningitis    spinal  . OCD (obsessive compulsive disorder)     Past Surgical History:  Procedure Laterality Date  . KIDNEY STONE SURGERY     Family History:  Family History  Problem Relation Age of Onset  . Cancer Mother        bladder  . Emphysema Father   . Emphysema Sister   . COPD Sister   . Cancer Brother        prostate   Family Psychiatric  History: see H&P. Social History:  History  Alcohol Use No     History  Drug Use No    Social History   Social History  . Marital status: Widowed    Spouse name: N/A  . Number of children: N/A  . Years of education: N/A   Social History Main Topics  . Smoking status: Never Smoker  . Smokeless tobacco: Never Used  . Alcohol use No  . Drug use: No  . Sexual activity: Not Currently   Other Topics Concern  . None   Social History Narrative  . None      Sleep: Good  Appetite:  Fair  Current Medications: Current Facility-Administered Medications  Medication Dose Route Frequency Provider Last Rate Last Dose  . acetaminophen (TYLENOL) tablet 650  mg  650 mg Oral Q6H PRN Clapacs, John T, MD      . alum & mag hydroxide-simeth (MAALOX/MYLANTA) 200-200-20 MG/5ML suspension 30 mL  30 mL Oral Q4H PRN Clapacs, John T, MD      . fluvoxaMINE (LUVOX) tablet 200 mg  200 mg Oral QHS Pucilowska, Jolanta B, MD   200 mg at 04/01/17 2209  . magnesium hydroxide (MILK OF MAGNESIA) suspension 30 mL  30 mL Oral Daily PRN Clapacs, John T, MD      . OLANZapine (ZYPREXA) tablet 5 mg  5 mg Oral QHS Clapacs, Jackquline Denmark, MD   5 mg at 04/01/17 2209  . tamsulosin (FLOMAX) capsule 0.4 mg  0.4 mg Oral BID AC Clapacs, John T, MD   0.4 mg at 04/02/17 0815    Lab Results:  No results found for this or any previous visit (from the past 48 hour(s)).  Blood Alcohol level:  Lab Results  Component Value Date   [facility] <5 01/04/2016   ETH <5 12/15/2015    Metabolic Disorder Labs: Lab Results  Component Value Date  HGBA1C 4.8 03/27/2017   MPG 91 03/27/2017   Lab Results  Component Value Date   PROLACTIN 29.7 (H) 01/05/2016   Lab Results  Component Value Date   CHOL 137 03/27/2017   TRIG 117 03/27/2017   HDL 30 (L) 03/27/2017   CHOLHDL 4.6 03/27/2017   VLDL 23 03/27/2017   LDLCALC 84 03/27/2017   LDLCALC 79 01/05/2016    Physical Findings: AIMS: Facial and Oral Movements Muscles of Facial Expression: None, normal Lips and Perioral Area: Minimal Jaw: None, normal Tongue: None, normal,Extremity Movements Upper (arms, wrists, hands, fingers): None, normal Lower (legs, knees, ankles, toes): None, normal, Trunk Movements Neck, shoulders, hips: None, normal, Overall Severity Severity of abnormal movements (highest score from questions above): None, normal Incapacitation due to abnormal movements: None, normal Patient's awareness of abnormal movements (rate only patient's report): No Awareness, Dental Status Current problems with teeth and/or dentures?: No Does patient usually wear dentures?: No  CIWA:    COWS:     Musculoskeletal: Strength & Muscle Tone:  within normal limits Gait & Station: unsteady, Uses a walker Patient leans: N/A  Psychiatric Specialty Exam: Physical Exam  Nursing note and vitals reviewed. Psychiatric: Cognition and memory are normal.    Review of Systems  Constitutional: Positive for malaise/fatigue and weight loss.  HENT: Negative.  Negative for hearing loss and tinnitus.   Eyes: Negative.  Negative for blurred vision and double vision.  Respiratory: Negative.  Negative for cough and shortness of breath.   Cardiovascular: Negative.  Negative for chest pain and palpitations.  Gastrointestinal: Negative.  Negative for heartburn, nausea and vomiting.  Genitourinary: Negative.  Negative for dysuria and urgency.  Musculoskeletal: Negative.  Negative for back pain, joint pain, myalgias and neck pain.  Skin: Negative.  Negative for rash.  Neurological: Positive for weakness. Negative for dizziness, tremors, speech change, seizures, loss of consciousness and headaches.       Difficulty ambulating and unstable gait. He is using a walker. He is also having some problems with short-term memory surrounding ECT.  Endo/Heme/Allergies: Negative.   Psychiatric/Behavioral: Positive for depression and suicidal ideas. The patient is nervous/anxious and has insomnia.     Blood pressure 114/86, pulse 78, temperature 98.6 F (37 C), temperature source Oral, resp. rate 18, height 5\' 9"  (1.753 m), weight 79.4 kg (175 lb), SpO2 100 %.Body mass index is 25.84 kg/m.  General Appearance: Disheveled  Eye Contact:  Minimal  Speech:  Slow and Slurred  Volume:  Decreased  Mood:  Depressed  Affect:  More interactive than yesterday  Thought Process:  Goal Directed and Descriptions of Associations: Intact  Orientation:  Full (Time, Place, and Person)  Thought Content:  WDL  Suicidal Thoughts:  No  Homicidal Thoughts:  No  Memory:  Immediate;   Fair Recent;   Fair Remote;   Fair  Judgement:  Impaired  Insight:  Lacking  Psychomotor  Activity:  Psychomotor Retardation  Concentration:  Concentration: Fair and Attention Span: Fair  Recall:  [employer]Fair  Fund of Knowledge:  Fair  Language:  Fair  Akathisia:  No  Handed:  Right  AIMS (if indicated):     Assets:  Communication Skills Desire for Improvement Financial Resources/Insurance Housing Resilience Social Support  ADL's:  Intact  Cognition:  WNL  Sleep:  Number of Hours: 8     Treatment Plan Summary: Daily contact with patient to assess and evaluate symptoms and progress in treatment and Medication management   [name] is a 66 year old male with  a history of depression and anxiety admitted for ECT.  1. Suicidal ideation. The patient is able to contract for safety in the hospital.  2. Mood and anxiety. We continued Zyprexa 5 mg by mouth nightly and Luvox 200 mg by mouth daily for depression and anxiety. The patient will continue with ECT treatment on Monday.   3. BPH. He is on Flomax.  4. Metabolic syndrome monitoring total cholesterol was 137 and hemoglobin A1c was 4.8   5. Completed prior to ECT. No QTC prolongation.   6. Deconditioning. PT input is greatly appreciated. Currently, the patient is ambulating with a walker.  7. Disposition. He will be discharged to home.  03/25/2017. We increased Lovox to 200 mg and ordered PT conusult. 03/26/2017. Ordered labs and EKG.  Case reviewed was treatment team. Updated treatment team plan. Patient will continue with ECT as well as his current medication with encouragement to be more physically active he didn't drink better and to improve his strength. Goal is to try to get him back to a physical and mental condition near his baseline before discharge. Estimated length of stay still probably in the neighborhood of a week.  Levora Angel, MD 04/02/2017, 3:04 PM

## 2017-04-02 NOTE — Plan of Care (Signed)
Problem: Safety: Goal: Ability to remain free from injury will improve Outcome: Progressing Pt remains free from injury commits to safety.    

## 2017-04-02 NOTE — [facility] Group Notes (Signed)
[facility] LCSW Group Therapy  04/02/2017 2:04 PM  Type of Therapy:  Group Therapy  Participation Level:  Active  Participation Quality:  Appropriate and Sharing  Affect:  Appropriate  Cognitive:  Alert  Insight:  Improving  Engagement in Therapy:  Improving  Modes of Intervention:  Discussion, Education, Problem-solving, Reality Testing, Socialization and Support  Summary of Progress/Problems: Goal Setting: The objective is to set goals as they relate to the crisis in which they were admitted. Patients will be using SMART goal modalities to set measurable goals. Characteristics of realistic goals will be discussed and patients will be assisted in setting and processing how one will reach their goal. Facilitator will also assist patients in applying interventions and coping skills learned in psycho-education groups to the SMART goal and process how one will achieve defined goal. Patient was much more talkative in group today and stated he was looking forward to his children visiting him today.   Yuliya Nova G. Garnette CzechSampson MSW, LCSWA 04/02/2017, 2:04 PM

## 2017-04-02 NOTE — Progress Notes (Signed)
Pt appeared to sleep about 8 hours while monitored on 15 minute safety checks.   

## 2017-04-02 NOTE — Progress Notes (Signed)
Pt reports mood is better. Pt denies SI, HI, a/v hallucinations. He can be withdrawn to room at times. Pt is medication, meal, and group compliant. Will continue to monitor.

## 2017-04-02 NOTE — Progress Notes (Signed)
D: Pt isolative with a flat affect.  More interactive upon approach but still withdrawn.  Medication compliant.  Did go outside with peers and got up for med's.  Steady gait and appears less weak then previous days.  Denies current s/i, h/i or hallucinations. A: Pt encouraged to participate in unit activities but given + feed back for improvements visible.  Monitored on 15 minute safety checks and maintained safety. R: Pt appears  To have slightly more energy and steadier gait.  Monitor safety and cont tx plan.

## 2017-04-02 NOTE — [facility] Suicide Risk Assessment (Signed)
[facility] INPATIENT:  Family/Significant Other Suicide Prevention Education  Suicide Prevention Education:  Education Completed;Dillon [name](son [phone]), has been identified by the patient as the family member/significant other with whom the patient will be residing, and identified as the person(s) who will aid the patient in the event of a mental health crisis (suicidal ideations/suicide attempt).  With written consent from the patient, the family member/significant other has been provided the following suicide prevention education, prior to the and/or following the discharge of the patient.  The suicide prevention education provided includes the following:  Suicide risk factors  Suicide prevention and interventions  National Suicide Hotline telephone number  [facility] assessment telephone number  [facility] Emergency Assistance 911  [facility] and/or Residential Mobile Crisis Unit telephone number  Request made of family/significant other to:  Remove weapons (e.g., guns, rifles, knives), all items previously/currently identified as safety concern.    Remove drugs/medications (over-the-counter, prescriptions, illicit drugs), all items previously/currently identified as a safety concern.  The family member/significant other verbalizes understanding of the suicide prevention education information provided.  The family member/significant other agrees to remove the items of safety concern listed above.  Dillon [name] G. Garnette CzechSampson MSW, LCSWA 04/02/2017, 10:36 AM

## 2017-04-02 NOTE — [facility] Group Notes (Signed)
[facility] Group Notes:  (Nursing/MHT/Case Management/Adjunct)  Date:  04/02/2017  Time:  3:31 AM  Type of Therapy:  Group Therapy  Participation Level:  Active  Participation Quality:  Appropriate  Affect:  Appropriate  Cognitive:  Appropriate  Insight:  Appropriate  Engagement in Group:  Engaged  Modes of Intervention:  Discussion  Summary of Progress/Problems:  Burt EkJanice Marie Amie Cowens 04/02/2017, 3:31 AM

## 2017-04-03 ENCOUNTER — Inpatient Hospital Stay: Payer: BLUE CROSS/BLUE SHIELD

## 2017-04-03 ENCOUNTER — Other Ambulatory Visit: Payer: Self-pay | Admitting: Psychiatry

## 2017-04-03 ENCOUNTER — Inpatient Hospital Stay: Payer: BLUE CROSS/BLUE SHIELD | Admitting: Certified Registered Nurse Anesthetist

## 2017-04-03 LAB — GLUCOSE, CAPILLARY: Glucose-Capillary: 114 mg/dL — ABNORMAL HIGH (ref 65–99)

## 2017-04-03 MED ORDER — SODIUM CHLORIDE 0.9 % IV SOLN
INTRAVENOUS | Status: DC | PRN
  Administered 2017-04-03: 11:00:00 via INTRAVENOUS

## 2017-04-03 MED ORDER — SUCCINYLCHOLINE CHLORIDE 20 MG/ML IJ SOLN
INTRAMUSCULAR | Status: AC
  Filled 2017-04-03: qty 1

## 2017-04-03 MED ORDER — SODIUM CHLORIDE 0.9 % IV SOLN
500.0000 mL | Freq: Once | INTRAVENOUS | Status: AC
  Administered 2017-04-03: 09:00:00 via INTRAVENOUS

## 2017-04-03 MED ORDER — SUCCINYLCHOLINE CHLORIDE 20 MG/ML IJ SOLN
INTRAMUSCULAR | Status: DC | PRN
  Administered 2017-04-03: 80 mg via INTRAVENOUS

## 2017-04-03 MED ORDER — METHOHEXITAL SODIUM 100 MG/10ML IV SOSY
PREFILLED_SYRINGE | INTRAVENOUS | Status: DC | PRN
Start: 2017-04-03 — End: 2017-04-03
  Administered 2017-04-03: 70 mg via INTRAVENOUS

## 2017-04-03 MED ORDER — LABETALOL HCL 5 MG/ML IV SOLN
INTRAVENOUS | Status: AC
  Filled 2017-04-03: qty 4

## 2017-04-03 MED ORDER — LABETALOL HCL 5 MG/ML IV SOLN
INTRAVENOUS | Status: DC | PRN
  Administered 2017-04-03: 20 mg via INTRAVENOUS

## 2017-04-03 MED ORDER — KETOROLAC TROMETHAMINE 30 MG/ML IJ SOLN
INTRAMUSCULAR | Status: AC
  Filled 2017-04-03: qty 1

## 2017-04-03 MED ORDER — KETOROLAC TROMETHAMINE 30 MG/ML IJ SOLN
30.0000 mg | Freq: Once | INTRAMUSCULAR | Status: AC
  Administered 2017-04-03: 30 mg via INTRAVENOUS

## 2017-04-03 NOTE — Transfer of Care (Signed)
Immediate Anesthesia Transfer of Care Note  Patient: Dillon [name]  Procedure(s) Performed: * No procedures listed *  Patient Location: PACU  Anesthesia Type:General  Level of Consciousness: drowsy and patient cooperative  Airway & Oxygen Therapy: Patient Spontanous Breathing and Patient connected to face mask oxygen  Post-op Assessment: Report given to RN and Post -op Vital signs reviewed and stable  Post vital signs: Reviewed and stable  Last Vitals:  Vitals:   04/03/17 0844 04/03/17 1055  BP: 118/77   Pulse: 60   Resp: 16   Temp: 36.8 C (P) 36.5 C    Last Pain:  Vitals:   04/03/17 0900  TempSrc:   PainSc: 0-No pain         Complications: No apparent anesthesia complications

## 2017-04-03 NOTE — Anesthesia Preprocedure Evaluation (Signed)
Anesthesia Evaluation  Patient identified by MRN, date of birth, ID band Patient awake    Reviewed: Allergy & Precautions, NPO status , Patient's Chart, lab work & pertinent test results  Airway Mallampati: III       Dental  (+) Teeth Intact   Pulmonary neg pulmonary ROS,    breath sounds clear to auscultation       Cardiovascular Exercise Tolerance: Good  Rhythm:Regular Rate:Normal     Neuro/Psych Depression negative neurological ROS     GI/Hepatic negative GI ROS, Neg liver ROS,   Endo/Other  negative endocrine ROS  Renal/GU      Musculoskeletal   Abdominal Normal abdominal exam  (+)   Peds  Hematology negative hematology ROS (+)   Anesthesia Other Findings   Reproductive/Obstetrics                             Anesthesia Physical Anesthesia Plan  ASA: II  Anesthesia Plan: General   Post-op Pain Management:    Induction: Intravenous  PONV Risk Score and Plan: 0  Airway Management Planned: Mask and Natural Airway  Additional Equipment:   Intra-op Plan:   Post-operative Plan:   Informed Consent: I have reviewed the patients History and Physical, chart, labs and discussed the procedure including the risks, benefits and alternatives for the proposed anesthesia with the patient or authorized representative who has indicated his/her understanding and acceptance.     Plan Discussed with: CRNA  Anesthesia Plan Comments:         Anesthesia Quick Evaluation

## 2017-04-03 NOTE — Anesthesia Procedure Notes (Signed)
Procedure Name: MAC Date/Time: 04/03/2017 10:44 AM Performed by: Darlyne Russian Pre-anesthesia Checklist: Patient identified, Timeout performed, Emergency Drugs available, Suction available and Patient being monitored Patient Re-evaluated:Patient Re-evaluated prior to inductionOxygen Delivery Method: Circle system utilized Preoxygenation: Pre-oxygenation with 100% oxygen Intubation Type: IV induction Ventilation: Mask ventilation without difficulty and Mask ventilation throughout procedure Airway Equipment and Method: Bite block Placement Confirmation: positive ETCO2 Dental Injury: Teeth and Oropharynx as per pre-operative assessment

## 2017-04-03 NOTE — Anesthesia Postprocedure Evaluation (Signed)
Anesthesia Post Note  Patient: Dillon BlacksmithRobert Henry [name]  Procedure(s) Performed: * No procedures listed *  Patient location during evaluation: PACU Anesthesia Type: General Level of consciousness: awake Pain management: pain level controlled Vital Signs Assessment: post-procedure vital signs reviewed and stable Respiratory status: spontaneous breathing Cardiovascular status: stable Anesthetic complications: no     Last Vitals:  Vitals:   04/03/17 1115 04/03/17 1125  BP: 114/79 122/77  Pulse: 65 65  Resp: (!) 24 19  Temp:  36.7 C    Last Pain:  Vitals:   04/03/17 1125  TempSrc:   PainSc: 0-No pain                 VAN STAVEREN,Azalea Cedar

## 2017-04-03 NOTE — Progress Notes (Signed)
Patient reports anxiety due to anticipation of ECT therapy today. Patient alert and oriented x 4. Verbalized that his goal to work on today is ways of coping with his depression and attend group sessions after his procedure, "If I feel up to it." Will continue to monitor patient while on the unit.

## 2017-04-03 NOTE — H&P (Signed)
Dillon [name] is an 66 y.o. male.   Chief Complaint: No chief complaint mood is improving HPI: Patient with major depression and OCD with severe weight loss and lack of energy and motivation coming into the hospital  Past Medical History:  Diagnosis Date  . Kidney stones   . Meningitis    spinal  . OCD (obsessive compulsive disorder)     Past Surgical History:  Procedure Laterality Date  . KIDNEY STONE SURGERY      Family History  Problem Relation Age of Onset  . Cancer Mother        bladder  . Emphysema Father   . Emphysema Sister   . COPD Sister   . Cancer Brother        prostate   Social History:  reports that he has never smoked. He has never used smokeless tobacco. He reports that he does not drink alcohol or use drugs.  Allergies:  Allergies  Allergen Reactions  . Codeine Other (See Comments)    Unsure of reaction    Medications Prior to Admission  Medication Sig Dispense Refill  . fluvoxaMINE (LUVOX) 100 MG tablet TAKE ONE TABLET BY MOUTH AT BEDTIME 30 tablet 0  . OLANZapine zydis (ZYPREXA) 5 MG disintegrating tablet Take 1 tablet (5 mg total) by mouth at bedtime. 30 tablet 1  . tamsulosin (FLOMAX) 0.4 MG CAPS capsule Take 1 capsule (0.4 mg total) by mouth daily after supper. (Patient taking differently: Take 0.4 mg by mouth 2 (two) times daily. ) 30 capsule 1  . traZODone (DESYREL) 50 MG tablet Take 1 tablet (50 mg total) by mouth at bedtime as needed for sleep. 30 tablet 1    Results for orders placed or performed during the hospital encounter of 03/22/17 (from the past 48 hour(s))  Glucose, capillary     Status: Abnormal   Collection Time: 04/03/17  6:35 AM  Result Value Ref Range   Glucose-Capillary 114 (H) 65 - 99 mg/dL   No results found.  Review of Systems  Constitutional: Negative.   HENT: Negative.   Eyes: Negative.   Respiratory: Negative.   Cardiovascular: Negative.   Gastrointestinal: Negative.   Musculoskeletal: Negative.   Skin:  Negative.   Neurological: Negative.   Psychiatric/Behavioral: Negative.     Blood pressure 118/77, pulse 60, temperature 98.2 F (36.8 C), temperature source Oral, resp. rate 16, height 5\' 9"  (1.753 m), weight 83 kg (183 lb), SpO2 98 %. Physical Exam  Nursing note and vitals reviewed. Constitutional: He appears well-developed and well-nourished.  HENT:  Head: Normocephalic and atraumatic.  Eyes: Conjunctivae are normal. Pupils are equal, round, and reactive to light.  Neck: Normal range of motion.  Cardiovascular: Regular rhythm and normal heart sounds.   Respiratory: Effort normal. No respiratory distress.  GI: Soft.  Musculoskeletal: Normal range of motion.  Neurological: He is alert.  Skin: Skin is warm and dry.  Psychiatric: His behavior is normal. Judgment and thought content normal. His affect is blunt. His speech is delayed.     Assessment/Plan Showing benefit with ECT today treatment number file work with treatment team on the possibility of discharged and  Dillon RasmussenJohn Clapacs, MD 04/03/2017, 10:36 AM

## 2017-04-03 NOTE — [facility] Group Notes (Signed)
[facility] LCSW Group Therapy Note  Date/Time: 04/03/17, 1300  Type of Therapy and Topic:  Group Therapy:  Overcoming Obstacles  Participation Level:  active  Description of Group:    In this group patients will be encouraged to explore what they see as obstacles to their own wellness and recovery. They will be guided to discuss their thoughts, feelings, and behaviors related to these obstacles. The group will process together ways to cope with barriers, with attention given to specific choices patients can make. Each patient will be challenged to identify changes they are motivated to make in order to overcome their obstacles. This group will be process-oriented, with patients participating in exploration of their own experiences as well as giving and receiving support and challenge from other group members.  Therapeutic Goals: 1. Patient will identify personal and current obstacles as they relate to admission. 2. Patient will identify barriers that currently interfere with their wellness or overcoming obstacles.  3. Patient will identify feelings, thought process and behaviors related to these barriers. 4. Patient will identify two changes they are willing to make to overcome these obstacles:    Summary of Patient Progress: Pt identified memory loss and his wife's death as obstacles that he is facing.  Pt shared with the group about how he was able to get through the loss of his wife, who he was married to for 46 years.        Therapeutic Modalities:   Cognitive Behavioral Therapy Solution Focused Therapy Motivational Interviewing Relapse Prevention Therapy  Daleen SquibbGreg Haasini Patnaude, LCSW

## 2017-04-03 NOTE — Progress Notes (Signed)
PT Cancellation Note  Patient Details Name: Dillon [name] MRN: [id number] DOB: 1951-03-23   Cancelled Treatment:    Reason Eval/Treat Not Completed: Other (comment).  Per notes, pt s/p ECT this morning.  Will re-attempt PT treatment at a later date/time.  Hendricks LimesEmily Jnaya Butrick, PT 04/03/17, 11:41 AM [phone]

## 2017-04-03 NOTE — Anesthesia Post-op Follow-up Note (Cosign Needed)
Anesthesia QCDR form completed.        

## 2017-04-03 NOTE — Progress Notes (Signed)
Recreation Therapy Notes  Date: 06.18.18 Time: 9:30 am Location: Craft Room  Group Topic: Self-expression  Goal Area(s) Addresses:  Patient will effectively use art as a means of self-expression. Patient will recognize positive benefit of self-expression. Patient will be able to identify one emotion experienced during group session. Patient will identify use of art as a coping skill.  Behavioral Response: Did not attend  Intervention: Two Faces of Me  Activity: Patients were given a blank face worksheet and were instructed to draw a line down the middle. On one side, patients were instructed to draw or write how they felt when they were admitted to the hospital and on the other side they were instructed to draw or write how they wanted to feel when they are d/c.  Education: LRT educated patients on other forms of self-expression.  Education Outcome: Patient did not attend group.  Clinical Observations/Feedback: Patient did not attend group.  Lakeasha Petion M, LRT/CTRS 04/03/2017 10:05 AM 

## 2017-04-03 NOTE — Plan of Care (Signed)
Problem: Activity: Goal: Interest or engagement in leisure activities will improve Outcome: Progressing Patient was active with the group and spent time outside as well as watching television later.  He had good eye contact on approach and engaged minimally with Clinical research associatewriter.  He is aware that ECT is tomorrow and is NPO after midnight.

## 2017-04-03 NOTE — Procedures (Signed)
ECT SERVICES Physician's Interval Evaluation & Treatment Note  Patient Identification: Dillon BlacksmithRobert Henry Hyson MRN:  [id number] Date of Evaluation:  04/03/2017 TX #: 5  MADRS:   MMSE:   P.E. Findings:  Heart and lungs normal vitals normal no change to physical exam  Psychiatric Interval Note:  Mood is reported as being better affect seems brighter energy level improved  Subjective:  Patient is a 66 y.o. male seen for evaluation for Electroconvulsive Therapy. No specific new complaint  Treatment Summary:   [x]   Right Unilateral             []  Bilateral   % Energy : 0.3 ms 70%   Impedance: 1010 ohms  Seizure Energy Index: 3490 V squared  Postictal Suppression Index: 20% (I suspect this is not really correctly because the computer did not read the correct ending time on (  Seizure Concordance Index: 89%  Medications  Pre Shock: Labetalol 20 mg Toradol 30 mg Brevital 70 mg succinylcholine 80 mg  Post Shock:    Seizure Duration: 15 seconds by EMG, I read 52 seconds by EEG the computer read 37 seconds   Comments: Next treatment Wednesday and continue Friday although treatment team is going to work with his family on the possibility of switching to outpatient if they are available for transport   Lungs:  [x]   Clear to auscultation               []  Other:   Heart:    [x]   Regular rhythm             []  irregular rhythm    [x]   Previous H&P reviewed, patient examined and there are NO CHANGES                 []   Previous H&P reviewed, patient examined and there are changes noted.   Mordecai RasmussenJohn Clapacs, MD 6/18/201810:37 AM

## 2017-04-03 NOTE — Plan of Care (Signed)
Problem: Safety: Goal: Ability to remain free from injury will improve Outcome: Progressing Client continues to remain free from injury.

## 2017-04-04 NOTE — Progress Notes (Signed)
   04/04/17 1400  Clinical Encounter Type  Visited With Patient;Other (Comment)  Visit Type Psychological support;Spiritual support;Social support;Behavioral Health;Other (Comment) (Group)   Patient joined the small group that Chaplain was leading. Patient was quite and reserved. Patient participated in the activities with the group.

## 2017-04-04 NOTE — [facility] Group Notes (Signed)
[facility] Group Notes:  (Nursing/MHT/Case Management/Adjunct)  Date:  04/04/2017  Time:  9:44 PM  Type of Therapy:  Group Therapy  Participation Level:  Active  Participation Quality:  Appropriate  Affect:  Appropriate  Cognitive:  Appropriate  Insight:  Appropriate  Engagement in Group:  Engaged  Modes of Intervention:  Discussion  Summary of Progress/Problems:  Burt EkJanice Marie Mone Commisso 04/04/2017, 9:44 PM

## 2017-04-04 NOTE — Progress Notes (Signed)
DAR NOTE: Patient is alert and oriented to person, place and time.  Ambulatory on the unit without difficulty.  Patient presents with flat affect and depressed mood.  Denies SI, auditory and visual hallucinations.  Medication given as prescribed.  Routine safety checks maintained.  Support and encouragement offered as needed.  Patient is safe on the unit.  Offered no complaint.

## 2017-04-04 NOTE — [facility] Group Notes (Signed)
[facility] LCSW Group Therapy Note  Date/Time:04/04/2017, 9:30am  Type of Therapy/Topic:  Group Therapy:  Feelings about Diagnosis  Participation Level:  Active   Mood: reports better mood   Description of Group:    This group will allow patients to explore their thoughts and feelings about diagnoses they have received. Patients will be guided to explore their level of understanding and acceptance of these diagnoses. Facilitator will encourage patients to process their thoughts and feelings about the reactions of others to their diagnosis, and will guide patients in identifying ways to discuss their diagnosis with significant others in their lives. This group will be process-oriented, with patients participating in exploration of their own experiences as well as giving and receiving support and challenge from other group members.   Therapeutic Goals: 1. Patient will demonstrate understanding of diagnosis as evidence by identifying two or more symptoms of the disorder:  2. Patient will be able to express two feelings regarding the diagnosis 3. Patient will demonstrate ability to communicate their needs through discussion and/or role plays  Summary of Patient Progress:  Pt able to meet therapeutic goals above. Pt shares feeling frustrated that he seems to come back for the same reasons over and over.  Wishing   Therapeutic Modalities:   Cognitive Behavioral Therapy Brief Therapy Feelings Identification    Glennon MacSara P Kitara Hebb, LCSW 04/04/2017, 4:44 PM

## 2017-04-04 NOTE — [facility] Group Notes (Signed)
Goals Group Date/Time: 04/04/2017 9:00 AM Type of Therapy and Topic: Group Therapy: Goals Group: SMART Goals   Participation Level: Moderate  Description of Group:    The purpose of a daily goals group is to assist and guide patients in setting recovery/wellness-related goals. The objective is to set goals as they relate to the crisis in which they were admitted. Patients will be using SMART goal modalities to set measurable goals. Characteristics of realistic goals will be discussed and patients will be assisted in setting and processing how one will reach their goal. Facilitator will also assist patients in applying interventions and coping skills learned in psycho-education groups to the SMART goal and process how one will achieve defined goal.   Therapeutic Goals:   -Patients will develop and document one goal related to or their crisis in which brought them into treatment.  -Patients will be guided by LCSW using SMART goal setting modality in how to set a measurable, attainable, realistic and time sensitive goal.  -Patients will process barriers in reaching goal.  -Patients will process interventions in how to overcome and successful in reaching goal.   Patient's Goal: prepare for discharge soon, continue to work towards going home   Therapeutic Modalities:  Motivational Interviewing  Engineer, manufacturing systemsCognitive Behavioral Therapy  Crisis Intervention Model  SMART goals setting  Glennon MacSara P Shalondra Wunschel,  LCSW 04/04/2017 ..........4:43 PM

## 2017-04-04 NOTE — Progress Notes (Signed)
Patient alert and oriented, ambulates ad lib without the use of his rolling walker, gait is steady. Stays in room majority of shift lying in bed with eyes closed. Compliant with medications and meals.Presents with a flat, sad affect, denies SI/HI, AVH. Patient safe on unit with q 15 minutes checks.

## 2017-04-04 NOTE — [facility] Group Notes (Signed)
[facility] Group Notes:  (Nursing/MHT/Case Management/Adjunct)  Date:  04/04/2017  Time:  1:48 PM  Type of Therapy:  Psychoeducational Skills  Participation Level:  Active  Participation Quality:  Appropriate, Attentive and Sharing  Affect:  Appropriate  Cognitive:  Alert and Appropriate  Insight:  Appropriate  Engagement in Group:  Engaged  Modes of Intervention:  Discussion, Education and Support  Summary of Progress/Problems:  Lynelle SmokeCara Travis Shanta Dorvil 04/04/2017, 1:48 PM

## 2017-04-04 NOTE — Progress Notes (Signed)
Physical Therapy Treatment Patient Details Name: Dillon [name] MRN: [id number] DOB: 31-Aug-1951 Today's Date: 04/04/2017    History of Present Illness Pt is a 66 y.o. male presenting to hospital d/t refusal of medications, not eating/drinking, and passive suicidal thoughts.  Pt s/p multiple ECT treatments during hospitalization.  PMH includes h/o recurrent depression and OCD.    PT Comments    Pt ambulating in hallways (no assistive device) independently and no loss of balance noted with ambulation or higher level balance activities.  Pt declining home exercise program and any further PT needs.  Pt appears to be back to baseline level in terms of functional of mobility.  No further PT needs identified during hospitalization or upon discharge.  Will discharge pt from PT in house.  Please re-consult PT if pt's status changes and acute PT needs are identified.    Follow Up Recommendations  No PT follow up     Equipment Recommendations  None recommended by PT    Recommendations for Other Services       Precautions / Restrictions Precautions Precautions: Fall Restrictions Weight Bearing Restrictions: No    Mobility  Bed Mobility Overal bed mobility: Independent             General bed mobility comments: Supine to/from sit without difficulties; bed flat  Transfers Overall transfer level: Independent Equipment used: None Transfers: Sit to/from Stand Sit to Stand: Independent         General transfer comment: no difficulties noted; steady  Ambulation/Gait Ambulation/Gait assistance: Independent Ambulation Distance (Feet): 200 Feet Assistive device: None Gait Pattern/deviations: WFL(Within Functional Limits)   Gait velocity interpretation: at or above normal speed for age/gender General Gait Details: steady without any loss of balance   Stairs            Wheelchair Mobility    Modified Rankin (Stroke Patients Only)       Balance Overall balance  assessment: Independent Sitting-balance support: No upper extremity supported;Feet supported Sitting balance-Leahy Scale: Normal Sitting balance - Comments: sitting reaching outside of BOS   Standing balance support: No upper extremity supported;During functional activity Standing balance-Leahy Scale: Normal Standing balance comment: ambulating with head turns R/L/up/down, increasing/decreasing speed, and turning and stopping without any difficulties (steady without any loss of balance)                            Cognition Arousal/Alertness: Awake/alert Behavior During Therapy:  (Interactive and occasionally smiling) Overall Cognitive Status: Within Functional Limits for tasks assessed                                        Exercises      General Comments General comments (skin integrity, edema, etc.): Pt resting in bed upon PT arrival.  Pt agreeable to PT session.      Pertinent Vitals/Pain Pain Assessment: No/denies pain    Home Living                      Prior Function            PT Goals (current goals can now be found in the care plan section) Acute Rehab PT Goals Patient Stated Goal: to go home PT Goal Formulation: With patient Time For Goal Achievement: 04/18/17 Potential to Achieve Goals: Good Progress towards PT goals: Goals met/education completed, patient  discharged from PT    Frequency    Other (Comment) (Discharge from PT in house)      PT Plan Discharge plan needs to be updated;Frequency needs to be updated;Equipment recommendations need to be updated    Co-evaluation              AM-PAC PT "6 Clicks" Daily Activity  Outcome Measure  Difficulty turning over in bed (including adjusting bedclothes, sheets and blankets)?: None Difficulty moving from lying on back to sitting on the side of the bed? : None Difficulty sitting down on and standing up from a chair with arms (e.g., wheelchair, bedside commode,  etc,.)?: None Help needed moving to and from a bed to chair (including a wheelchair)?: None Help needed walking in hospital room?: None Help needed climbing 3-5 steps with a railing? : None 6 Click Score: 24    End of Session Equipment Utilized During Treatment: Gait belt Activity Tolerance: Patient tolerated treatment well Patient left: in bed   PT Visit Diagnosis: Muscle weakness (generalized) (M62.81);Difficulty in walking, not elsewhere classified (R26.2)     Time: 1020-1030 PT Time Calculation (min) (ACUTE ONLY): 10 min  Charges:  $Therapeutic Activity: 8-22 mins                    G CodesLeitha Bleak, PT 04/04/17, 3:06 PM [phone]

## 2017-04-04 NOTE — [facility] Group Notes (Signed)
[facility] Group Notes:  (Nursing/MHT/Case Management/Adjunct)  Date:  04/04/2017  Time:  12:36 AM  Type of Therapy:  Group Therapy  Participation Level:  Active  Participation Quality:  Appropriate  Affect:  Appropriate  Cognitive:  Appropriate  Insight:  Good  Engagement in Group:  Supportive  Modes of Intervention:  Support  Summary of Progress/Problems:  Dillon NeerJackie L Gordy [name] 04/04/2017, 12:36 AM

## 2017-04-04 NOTE — Progress Notes (Signed)
[facility] MD Progress Note  04/04/2017 7:17 PM Dillon [name]  MRN:  [id number]  Subjective:  03/25/2017. Dillon [name] is very depressed and utterly hopeless. He has no energy to get out of bed. Following his last hospitalization he did well for a while but stopped taking medication. He lost over 30 lbs. He is unable to tolerate Ensure. Accepts medication, reports no side effects. He is unable to ambulate independently.  03/26/2017. Dillon [name] reports feeling slightly better but refuses to get up for lunch. He uses a wheel chair. He is despondent and utterly hopeless. Awaiting PT consult.   Follow-up Monday the 11th. Patient had ECT today. Treatment tolerated well. This afternoon he says he is subjectively feeling a little better but he remains in bed with his affect blunted and withdrawn. Still not eating or drinking adequately.  Follow-up Tuesday the 12th. Patient seen this morning. He was awake and interactive but as usual answers questions as briefly as possible. Minimal physical activity. Still not eating and drinking adequately. Denied suicidal ideation. Admitted to still having some obsessive worry  Follow-up Wednesday the 13th. Patient had ECT again today which once again was tolerated well. He has no new complaints but his mental status remains about the same. Spends most of his time in bed. Not eating and drinking very well. I did notice that this evening he was up and was visiting appropriately with his son. Not reporting any suicidal ideation but still appears very slow and limited in his thoughts and ability  Follow-up for Thursday the 14th. Patient still spends most of his time in bed although he has attended some groups and is eating and drinking better. He is able to smile and make light conversation better. He acknowledges that he is still having obsessive thoughts and worries. Denies suicidal thoughts.  Follow-up Friday the 15th. Patient had ECT today once again well tolerated without  complications other than a small bite on his lip. Patient reports that his mood continues to improve. Energy level is still low and he also has noticed some short-term memory loss but is not showing signs of delirium.   04/01/17 The patient had ECT yesterday and was having some problems with short-term memory after the ECT. Otherwise, he tolerated the procedure fairly well. He does admit to depressive symptoms related to being widowed and his wife passing 2 years ago. He was married for over 44 years. He has been fairly isolative to his room and does not interact a lot with other patients says that his mood is slowly improving. He denies any current auditory or visual hallucinations but does report some psychosis prior to admission. He denies any current active or passive suicidal thoughts despite the depression. The patient is ambulating with a walker. He was unable to participate with PT yesterday secondary to the ECT procedure. He denies any somatic complaints. He slept over 9 hours last night. Vital signs are stable. Appetite is fairly good. He was encouraged to try and interact more with staff and peers  Supportive psychotherapy provided and time spent discussing grief and loss. Times spent discussing ways to honor his wife's memory.  04/03/17 The patient continues to be agitated and withdrawn to his room but was more interactive with this Clinical research associate today. He was talking more spontaneously and speech was not is delayed. He has had some short-term memory problems and ECT but otherwise no side effects. He did sleep well last night, over 8 hours. Appetite is fairly good and he does  eat his meals. He has not been attending any groups. So far, he feels like his mood is slowly improving. He denies any current active or passive suicidal thoughts. The patient denies any current auditory or visual hallucinations. No paranoid thoughts or delusions. He denies any somatic complaints.   Supportive psychotherapy  provided and time spent discussing ways to honor his wife's memory.  June 19. Follow-up note. Patient states his mood is getting better. Still has obsessive worries. He has been attending some groups however and is able to interact with other people better. Denies any current suicidal thoughts. Has some memory impairment but overall tolerating ECT well.  Principal Problem: Recurrent major depression-severe (HCC) Diagnosis:   Patient Active Problem List   Diagnosis Date Noted  . Recurrent major depression-severe (HCC) [F33.2] 03/22/2017  . Hospital discharge follow-up [Z09] 02/03/2016  . Suicidal ideation [R45.851] 01/04/2016  . Severe recurrent major depression without psychotic features (HCC) [F33.2] 12/15/2015  . Grief [F43.20] 12/15/2015  . Weight loss [R63.4] 12/15/2015  . OCD (obsessive compulsive disorder) [F42.9] 12/15/2015  . Benign prostatic hyperplasia with urinary obstruction [N40.1, N13.8] 11/24/2015  . 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  Past Medical History:  Past Medical History:  Diagnosis Date  . Kidney stones   . Meningitis    spinal  . OCD (obsessive compulsive disorder)     Past Surgical History:  Procedure Laterality Date  . KIDNEY STONE SURGERY     Family History:  Family History  Problem Relation Age of Onset  . Cancer Mother        bladder  . Emphysema Father   . Emphysema Sister   . COPD Sister   . Cancer Brother        prostate   Family Psychiatric  History: see H&P. Social History:  History  Alcohol Use No     History  Drug Use No    Social History   Social History  . Marital status: Widowed    Spouse name: N/A  . Number of children: N/A  . Years of education: N/A   Social History Main Topics  . Smoking status: Never Smoker  . Smokeless tobacco: Never Used  . Alcohol use No  . Drug use: No  . Sexual activity: Not  Currently   Other Topics Concern  . None   Social History Narrative  . None      Sleep: Good  Appetite:  Fair  Current Medications: Current Facility-Administered Medications  Medication Dose Route Frequency Provider Last Rate Last Dose  . acetaminophen (TYLENOL) tablet 650 mg  650 mg Oral Q6H PRN Clapacs, John T, MD      . alum & mag hydroxide-simeth (MAALOX/MYLANTA) 200-200-20 MG/5ML suspension 30 mL  30 mL Oral Q4H PRN Clapacs, John T, MD      . fluvoxaMINE (LUVOX) tablet 200 mg  200 mg Oral QHS Pucilowska, Jolanta B, MD   200 mg at 04/03/17 2131  . magnesium hydroxide (MILK OF MAGNESIA) suspension 30 mL  30 mL Oral Daily PRN Clapacs, John T, MD      . OLANZapine (ZYPREXA) tablet 5 mg  5 mg Oral QHS Clapacs, John T, MD   5 mg at 04/03/17 2130  . tamsulosin (FLOMAX) capsule 0.4 mg  0.4 mg Oral BID AC Clapacs, John T, MD   0.4 mg at 04/04/17 1700    Lab Results:  Results for orders placed  or performed during the hospital encounter of 03/22/17 (from the past 48 hour(s))  Glucose, capillary     Status: Abnormal   Collection Time: 04/03/17  6:35 AM  Result Value Ref Range   Glucose-Capillary 114 (H) 65 - 99 mg/dL    Blood Alcohol level:  Lab Results  Component Value Date   ETH <5 01/04/2016   ETH <5 12/15/2015    Metabolic Disorder Labs: Lab Results  Component Value Date   HGBA1C 4.8 03/27/2017   MPG 91 03/27/2017   Lab Results  Component Value Date   PROLACTIN 29.7 (H) 01/05/2016   Lab Results  Component Value Date   CHOL 137 03/27/2017   TRIG 117 03/27/2017   HDL 30 (L) 03/27/2017   CHOLHDL 4.6 03/27/2017   VLDL 23 03/27/2017   LDLCALC 84 03/27/2017   LDLCALC 79 01/05/2016    Physical Findings: AIMS: Facial and Oral Movements Muscles of Facial Expression: None, normal Lips and Perioral Area: Minimal Jaw: None, normal Tongue: None, normal,Extremity Movements Upper (arms, wrists, hands, fingers): None, normal Lower (legs, knees, ankles, toes): None,  normal, Trunk Movements Neck, shoulders, hips: None, normal, Overall Severity Severity of abnormal movements (highest score from questions above): None, normal Incapacitation due to abnormal movements: None, normal Patient's awareness of abnormal movements (rate only patient's report): No Awareness, Dental Status Current problems with teeth and/or dentures?: No Does patient usually wear dentures?: No  CIWA:    COWS:     Musculoskeletal: Strength & Muscle Tone: within normal limits Gait & Station: unsteady, Uses a walker Patient leans: N/A  Psychiatric Specialty Exam: Physical Exam  Nursing note and vitals reviewed. Psychiatric: Cognition and memory are normal.    Review of Systems  Constitutional: Positive for malaise/fatigue and weight loss.  HENT: Negative.  Negative for hearing loss and tinnitus.   Eyes: Negative.  Negative for blurred vision and double vision.  Respiratory: Negative.  Negative for cough and shortness of breath.   Cardiovascular: Negative.  Negative for chest pain and palpitations.  Gastrointestinal: Negative.  Negative for heartburn, nausea and vomiting.  Genitourinary: Negative.  Negative for dysuria and urgency.  Musculoskeletal: Negative.  Negative for back pain, joint pain, myalgias and neck pain.  Skin: Negative.  Negative for rash.  Neurological: Positive for weakness. Negative for dizziness, tremors, speech change, seizures, loss of consciousness and headaches.       Difficulty ambulating and unstable gait. He is using a walker. He is also having some problems with short-term memory surrounding ECT.  Endo/Heme/Allergies: Negative.   Psychiatric/Behavioral: Positive for depression and suicidal ideas. The patient is nervous/anxious and has insomnia.     Blood pressure 108/72, pulse 62, temperature 97.8 F (36.6 C), temperature source Oral, resp. rate 16, height 5\' 9"  (1.753 m), weight 83 kg (183 lb), SpO2 96 %.Body mass index is 27.02 kg/m.  General  Appearance: Disheveled  Eye Contact:  Minimal  Speech:  Slow and Slurred  Volume:  Decreased  Mood:  Depressed  Affect:  More interactive than yesterday  Thought Process:  Goal Directed and Descriptions of Associations: Intact  Orientation:  Full (Time, Place, and Person)  Thought Content:  WDL  Suicidal Thoughts:  No  Homicidal Thoughts:  No  Memory:  Immediate;   Fair Recent;   Fair Remote;   Fair  Judgement:  Impaired  Insight:  Lacking  Psychomotor Activity:  Psychomotor Retardation  Concentration:  Concentration: Fair and Attention Span: Fair  Recall:  [employer] of Knowledge:  Fair  Language:  Fair  Akathisia:  No  Handed:  Right  AIMS (if indicated):     Assets:  Communication Skills Desire for Improvement Financial Resources/Insurance Housing Resilience Social Support  ADL's:  Intact  Cognition:  WNL  Sleep:  Number of Hours: 7.15     Treatment Plan Summary: Daily contact with patient to assess and evaluate symptoms and progress in treatment and Medication management   [name] is a 66 year old male with a history of depression and anxiety admitted for ECT.  1. Suicidal ideation. The patient is able to contract for safety in the hospital.  2. Mood and anxiety. We continued Zyprexa 5 mg by mouth nightly and Luvox 200 mg by mouth daily for depression and anxiety. The patient will continue with ECT treatment on Monday.   3. BPH. He is on Flomax.  4. Metabolic syndrome monitoring total cholesterol was 137 and hemoglobin A1c was 4.8   5. Completed prior to ECT. No QTC prolongation.   6. Deconditioning. PT input is greatly appreciated. Currently, the patient is ambulating with a walker.  7. Disposition. He will be discharged to home.  03/25/2017. We increased Lovox to 200 mg and ordered PT conusult. 03/26/2017. Ordered labs and EKG.  Patient will continue with ECT treatment although it is possible that we may be able to look at discharge within the next 2 or  3 days. No change to medicine. Dillon RasmussenJohn Clapacs, MD 04/04/2017, 7:17 PM

## 2017-04-04 NOTE — Plan of Care (Signed)
Problem: Safety: Goal: Ability to remain free from injury will improve Outcome: Progressing Patient remains free from injury.    

## 2017-04-05 ENCOUNTER — Inpatient Hospital Stay: Payer: BLUE CROSS/BLUE SHIELD | Admitting: Anesthesiology

## 2017-04-05 ENCOUNTER — Encounter: Payer: Self-pay | Admitting: *Deleted

## 2017-04-05 ENCOUNTER — Other Ambulatory Visit: Payer: Self-pay | Admitting: Psychiatry

## 2017-04-05 LAB — GLUCOSE, CAPILLARY: GLUCOSE-CAPILLARY: 116 mg/dL — AB (ref 65–99)

## 2017-04-05 MED ORDER — LABETALOL HCL 5 MG/ML IV SOLN
INTRAVENOUS | Status: AC
  Filled 2017-04-05: qty 4

## 2017-04-05 MED ORDER — LABETALOL HCL 5 MG/ML IV SOLN
INTRAVENOUS | Status: DC | PRN
  Administered 2017-04-05: 20 mg via INTRAVENOUS

## 2017-04-05 MED ORDER — KETOROLAC TROMETHAMINE 30 MG/ML IJ SOLN
30.0000 mg | Freq: Once | INTRAMUSCULAR | Status: AC
  Administered 2017-04-05: 30 mg via INTRAVENOUS

## 2017-04-05 MED ORDER — SUCCINYLCHOLINE CHLORIDE 200 MG/10ML IV SOSY
PREFILLED_SYRINGE | INTRAVENOUS | Status: DC | PRN
  Administered 2017-04-05: 80 mg via INTRAVENOUS

## 2017-04-05 MED ORDER — METHOHEXITAL SODIUM 100 MG/10ML IV SOSY
PREFILLED_SYRINGE | INTRAVENOUS | Status: DC | PRN
  Administered 2017-04-05: 70 mg via INTRAVENOUS

## 2017-04-05 MED ORDER — SUCCINYLCHOLINE CHLORIDE 20 MG/ML IJ SOLN
INTRAMUSCULAR | Status: AC
  Filled 2017-04-05: qty 1

## 2017-04-05 MED ORDER — SODIUM CHLORIDE 0.9 % IV SOLN
500.0000 mL | Freq: Once | INTRAVENOUS | Status: AC
  Administered 2017-04-05: 1000 mL via INTRAVENOUS

## 2017-04-05 MED ORDER — KETOROLAC TROMETHAMINE 30 MG/ML IJ SOLN
INTRAMUSCULAR | Status: AC
  Administered 2017-04-05: 30 mg via INTRAVENOUS
  Filled 2017-04-05: qty 1

## 2017-04-05 MED ORDER — SODIUM CHLORIDE 0.9 % IV SOLN
INTRAVENOUS | Status: DC | PRN
  Administered 2017-04-05: 10:00:00 via INTRAVENOUS

## 2017-04-05 NOTE — Progress Notes (Signed)
[facility] MD Progress Note  04/05/2017 7:43 PM Dillon [name]  MRN:  [id number]  Subjective:  03/25/2017. Dillon [name] is very depressed and utterly hopeless. He has no energy to get out of bed. Following his last hospitalization he did well for a while but stopped taking medication. He lost over 30 lbs. He is unable to tolerate Ensure. Accepts medication, reports no side effects. He is unable to ambulate independently.  03/26/2017. Dillon [name] reports feeling slightly better but refuses to get up for lunch. He uses a wheel chair. He is despondent and utterly hopeless. Awaiting PT consult.   Follow-up Monday the 11th. Patient had ECT today. Treatment tolerated well. This afternoon he says he is subjectively feeling a little better but he remains in bed with his affect blunted and withdrawn. Still not eating or drinking adequately.  Follow-up Tuesday the 12th. Patient seen this morning. He was awake and interactive but as usual answers questions as briefly as possible. Minimal physical activity. Still not eating and drinking adequately. Denied suicidal ideation. Admitted to still having some obsessive worry  Follow-up Wednesday the 13th. Patient had ECT again today which once again was tolerated well. He has no new complaints but his mental status remains about the same. Spends most of his time in bed. Not eating and drinking very well. I did notice that this evening he was up and was visiting appropriately with his son. Not reporting any suicidal ideation but still appears very slow and limited in his thoughts and ability  Follow-up for Thursday the 14th. Patient still spends most of his time in bed although he has attended some groups and is eating and drinking better. He is able to smile and make light conversation better. He acknowledges that he is still having obsessive thoughts and worries. Denies suicidal thoughts.  Follow-up Friday the 15th. Patient had ECT today once again well tolerated without  complications other than a small bite on his lip. Patient reports that his mood continues to improve. Energy level is still low and he also has noticed some short-term memory loss but is not showing signs of delirium.   04/01/17 The patient had ECT yesterday and was having some problems with short-term memory after the ECT. Otherwise, he tolerated the procedure fairly well. He does admit to depressive symptoms related to being widowed and his wife passing 2 years ago. He was married for over 44 years. He has been fairly isolative to his room and does not interact a lot with other patients says that his mood is slowly improving. He denies any current auditory or visual hallucinations but does report some psychosis prior to admission. He denies any current active or passive suicidal thoughts despite the depression. The patient is ambulating with a walker. He was unable to participate with PT yesterday secondary to the ECT procedure. He denies any somatic complaints. He slept over 9 hours last night. Vital signs are stable. Appetite is fairly good. He was encouraged to try and interact more with staff and peers  Supportive psychotherapy provided and time spent discussing grief and loss. Times spent discussing ways to honor his wife's memory.  04/03/17 The patient continues to be agitated and withdrawn to his room but was more interactive with this Clinical research associate today. He was talking more spontaneously and speech was not is delayed. He has had some short-term memory problems and ECT but otherwise no side effects. He did sleep well last night, over 8 hours. Appetite is fairly good and he does  eat his meals. He has not been attending any groups. So far, he feels like his mood is slowly improving. He denies any current active or passive suicidal thoughts. The patient denies any current auditory or visual hallucinations. No paranoid thoughts or delusions. He denies any somatic complaints.   Supportive psychotherapy  provided and time spent discussing ways to honor his wife's memory.  Follow-up for that 20th. Patient had ECT today tolerated well. Mood continues to be reported as improved. He is up out of bed much more regularly. Denies any feeling of depression. Still has some obsessive worry. Eating better. No suicidality.  June 19. Follow-up note. Patient states his mood is getting better. Still has obsessive worries. He has been attending some groups however and is able to interact with other people better. Denies any current suicidal thoughts. Has some memory impairment but overall tolerating ECT well.  Principal Problem: Recurrent major depression-severe (HCC) Diagnosis:   Patient Active Problem List   Diagnosis Date Noted  . Recurrent major depression-severe (HCC) [F33.2] 03/22/2017  . Hospital discharge follow-up [Z09] 02/03/2016  . Suicidal ideation [R45.851] 01/04/2016  . Severe recurrent major depression without psychotic features (HCC) [F33.2] 12/15/2015  . Grief [F43.20] 12/15/2015  . Weight loss [R63.4] 12/15/2015  . OCD (obsessive compulsive disorder) [F42.9] 12/15/2015  . Benign prostatic hyperplasia with urinary obstruction [N40.1, N13.8] 11/24/2015  . 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  Past Medical History:  Past Medical History:  Diagnosis Date  . Kidney stones   . Meningitis    spinal  . OCD (obsessive compulsive disorder)     Past Surgical History:  Procedure Laterality Date  . KIDNEY STONE SURGERY     Family History:  Family History  Problem Relation Age of Onset  . Cancer Mother        bladder  . Emphysema Father   . Emphysema Sister   . COPD Sister   . Cancer Brother        prostate   Family Psychiatric  History: see H&P. Social History:  History  Alcohol Use No     History  Drug Use No    Social History   Social History  . Marital status:  Widowed    Spouse name: N/A  . Number of children: N/A  . Years of education: N/A   Social History Main Topics  . Smoking status: Never Smoker  . Smokeless tobacco: Never Used  . Alcohol use No  . Drug use: No  . Sexual activity: Not Currently   Other Topics Concern  . None   Social History Narrative  . None      Sleep: Good  Appetite:  Fair  Current Medications: Current Facility-Administered Medications  Medication Dose Route Frequency Provider Last Rate Last Dose  . acetaminophen (TYLENOL) tablet 650 mg  650 mg Oral Q6H PRN Taylan Mayhan T, MD      . alum & mag hydroxide-simeth (MAALOX/MYLANTA) 200-200-20 MG/5ML suspension 30 mL  30 mL Oral Q4H PRN Delaina Fetsch T, MD      . fluvoxaMINE (LUVOX) tablet 200 mg  200 mg Oral QHS Pucilowska, Jolanta B, MD   200 mg at 04/04/17 2212  . magnesium hydroxide (MILK OF MAGNESIA) suspension 30 mL  30 mL Oral Daily PRN Cade Dashner T, MD      . OLANZapine (ZYPREXA) tablet 5 mg  5 mg Oral QHS Robynn Marcel, Jackquline Denmark, MD  5 mg at 04/04/17 2214  . tamsulosin (FLOMAX) capsule 0.4 mg  0.4 mg Oral BID AC Samanvitha Germany, Jackquline DenmarkJohn T, MD   0.4 mg at 04/05/17 1650    Lab Results:  Results for orders placed or performed during the hospital encounter of 03/22/17 (from the past 48 hour(s))  Glucose, capillary     Status: Abnormal   Collection Time: 04/05/17  6:30 AM  Result Value Ref Range   Glucose-Capillary 116 (H) 65 - 99 mg/dL   Comment 1 Notify RN    Comment 2 Document in Chart     Blood Alcohol level:  Lab Results  Component Value Date   ETH <5 01/04/2016   ETH <5 12/15/2015    Metabolic Disorder Labs: Lab Results  Component Value Date   HGBA1C 4.8 03/27/2017   MPG 91 03/27/2017   Lab Results  Component Value Date   PROLACTIN 29.7 (H) 01/05/2016   Lab Results  Component Value Date   CHOL 137 03/27/2017   TRIG 117 03/27/2017   HDL 30 (L) 03/27/2017   CHOLHDL 4.6 03/27/2017   VLDL 23 03/27/2017   LDLCALC 84 03/27/2017   LDLCALC 79  01/05/2016    Physical Findings: AIMS: Facial and Oral Movements Muscles of Facial Expression: None, normal Lips and Perioral Area: Minimal Jaw: None, normal Tongue: None, normal,Extremity Movements Upper (arms, wrists, hands, fingers): None, normal Lower (legs, knees, ankles, toes): None, normal, Trunk Movements Neck, shoulders, hips: None, normal, Overall Severity Severity of abnormal movements (highest score from questions above): None, normal Incapacitation due to abnormal movements: None, normal Patient's awareness of abnormal movements (rate only patient's report): No Awareness, Dental Status Current problems with teeth and/or dentures?: No Does patient usually wear dentures?: No  CIWA:    COWS:     Musculoskeletal: Strength & Muscle Tone: within normal limits Gait & Station: unsteady, Uses a walker Patient leans: N/A  Psychiatric Specialty Exam: Physical Exam  Nursing note and vitals reviewed. Psychiatric: Cognition and memory are normal.    Review of Systems  Constitutional: Positive for malaise/fatigue and weight loss.  HENT: Negative.  Negative for hearing loss and tinnitus.   Eyes: Negative.  Negative for blurred vision and double vision.  Respiratory: Negative.  Negative for cough and shortness of breath.   Cardiovascular: Negative.  Negative for chest pain and palpitations.  Gastrointestinal: Negative.  Negative for heartburn, nausea and vomiting.  Genitourinary: Negative.  Negative for dysuria and urgency.  Musculoskeletal: Negative.  Negative for back pain, joint pain, myalgias and neck pain.  Skin: Negative.  Negative for rash.  Neurological: Positive for weakness. Negative for dizziness, tremors, speech change, seizures, loss of consciousness and headaches.       Difficulty ambulating and unstable gait. He is using a walker. He is also having some problems with short-term memory surrounding ECT.  Endo/Heme/Allergies: Negative.   Psychiatric/Behavioral:  Positive for depression and suicidal ideas. The patient is nervous/anxious and has insomnia.     Blood pressure 121/83, pulse 63, temperature 97.8 F (36.6 C), resp. rate 18, height 5\' 9"  (1.753 m), weight 187 lb (84.8 kg), SpO2 95 %.Body mass index is 27.62 kg/m.  General Appearance: Disheveled  Eye Contact:  Minimal  Speech:  Slow and Slurred  Volume:  Decreased  Mood:  Depressed  Affect:  More interactive than yesterday  Thought Process:  Goal Directed and Descriptions of Associations: Intact  Orientation:  Full (Time, Place, and Person)  Thought Content:  WDL  Suicidal Thoughts:  No  Homicidal Thoughts:  No  Memory:  Immediate;   Fair Recent;   Fair Remote;   Fair  Judgement:  Impaired  Insight:  Lacking  Psychomotor Activity:  Psychomotor Retardation  Concentration:  Concentration: Fair and Attention Span: Fair  Recall:  [employer] of Knowledge:  Fair  Language:  Fair  Akathisia:  No  Handed:  Right  AIMS (if indicated):     Assets:  Communication Skills Desire for Improvement Financial Resources/Insurance Housing Resilience Social Support  ADL's:  Intact  Cognition:  WNL  Sleep:  Number of Hours: 7.15     Treatment Plan Summary: Daily contact with patient to assess and evaluate symptoms and progress in treatment and Medication management   Mr. Spivack is a 66 year old male with a history of depression and anxiety admitted for ECT.  1. Suicidal ideation. The patient is able to contract for safety in the hospital.  2. Mood and anxiety. We continued Zyprexa 5 mg by mouth nightly and Luvox 200 mg by mouth daily for depression and anxiety. The patient will continue with ECT treatment on Monday.   3. BPH. He is on Flomax.  4. Metabolic syndrome monitoring total cholesterol was 137 and hemoglobin A1c was 4.8   5. Completed prior to ECT. No QTC prolongation.   6. Deconditioning. PT input is greatly appreciated. Currently, the patient is ambulating with a  walker.  7. Disposition. He will be discharged to home.  03/25/2017. We increased Lovox to 200 mg and ordered PT conusult. 03/26/2017. Ordered labs and EKG.  After 6 treatments I think we have probably achieved an improved baseline. I am anticipating discharge tomorrow after which we would like to have outpatient ECT with a return on Monday, July 2. Patient tentatively agreeable. Case reviewed with social work and nursing. [name], MD 04/05/2017, 7:43 PM

## 2017-04-05 NOTE — Anesthesia Post-op Follow-up Note (Cosign Needed)
Anesthesia QCDR form completed.        

## 2017-04-05 NOTE — Anesthesia Preprocedure Evaluation (Signed)
Anesthesia Evaluation  Patient identified by MRN, date of birth, ID band Patient awake    Reviewed: Allergy & Precautions, NPO status , Patient's Chart, lab work & pertinent test results  Airway Mallampati: II       Dental  (+) Teeth Intact   Pulmonary neg pulmonary ROS,     + decreased breath sounds      Cardiovascular Exercise Tolerance: Good  Rhythm:Regular     Neuro/Psych Depression negative neurological ROS     GI/Hepatic negative GI ROS, Neg liver ROS,   Endo/Other  negative endocrine ROS  Renal/GU   negative genitourinary   Musculoskeletal   Abdominal Normal abdominal exam  (+)   Peds negative pediatric ROS (+)  Hematology negative hematology ROS (+)   Anesthesia Other Findings   Reproductive/Obstetrics                             Anesthesia Physical Anesthesia Plan  ASA: II  Anesthesia Plan: General   Post-op Pain Management:    Induction: Intravenous  PONV Risk Score and Plan: 0  Airway Management Planned: Mask and Natural Airway  Additional Equipment:   Intra-op Plan:   Post-operative Plan:   Informed Consent: I have reviewed the patients History and Physical, chart, labs and discussed the procedure including the risks, benefits and alternatives for the proposed anesthesia with the patient or authorized representative who has indicated his/her understanding and acceptance.     Plan Discussed with: CRNA  Anesthesia Plan Comments:         Anesthesia Quick Evaluation

## 2017-04-05 NOTE — [facility] Group Notes (Signed)
  [facility] LCSW Group Therapy Note  Date/Time:04/05/2017  1pm  Type of Therapy/Topic:  Group Therapy:  Emotion Regulation  Participation Level:  Active   Mood: Reports better mood  Description of Group:    The purpose of this group is to assist patients in learning to regulate negative emotions and experience positive emotions. Patients will be guided to discuss ways in which they have been vulnerable to their negative emotions. These vulnerabilities will be juxtaposed with experiences of positive emotions or situations, and patients challenged to use positive emotions to combat negative ones. Special emphasis will be placed on coping with negative emotions in conflict situations, and patients will process healthy conflict resolution skills.  Therapeutic Goals: 1. Patient will identify two positive emotions or experiences to reflect on in order to balance out negative emotions:  2. Patient will label two or more emotions that they find the most difficult to experience:  3. Patient will be able to demonstrate positive conflict resolution skills through discussion or role plays:   Summary of Patient Progress:  Pt able to meet therapeutic goals listed above, much more animated affect, talking to other patients and even making some attempts at being humorous. Verbalizing some frustration with short term memory loss of his daughter's telephone number. CSW provided reassurance that it would likely come back to him     Therapeutic Modalities:   Cognitive Behavioral Therapy Feelings Identification Dialectical Behavioral Therapy    Glennon MacSara P Kitt Ledet, LCSW 04/05/2017, 2:50 PM

## 2017-04-05 NOTE — Plan of Care (Signed)
Problem: Safety: Goal: Ability to remain free from injury will improve Outcome: Progressing No injury reported or observed   

## 2017-04-05 NOTE — Progress Notes (Signed)
Patient ID: Dillon [name], male   DOB: 1950-12-28, 66 y.o.   MRN: [id number] CSW left voicemail for Pt's daughter [name] [phone], notifying her of Pt's discharge tomorrow and to ask if Pt would have transportation to next Maintenance ECT on 04/17/17 ([name] will be out of town all next week and this is first date he will resume ECT).  Also called to notify her of Pt[facility].  [name], LCSW

## 2017-04-05 NOTE — Anesthesia Procedure Notes (Signed)
Date/Time: 04/05/2017 10:42 AM Performed by: Lily KocherPERALTA, Hendel Gatliff Pre-anesthesia Checklist: Patient identified, Emergency Drugs available, Suction available and Patient being monitored Patient Re-evaluated:Patient Re-evaluated prior to inductionOxygen Delivery Method: Circle system utilized Preoxygenation: Pre-oxygenation with 100% oxygen Intubation Type: IV induction Ventilation: Mask ventilation without difficulty and Mask ventilation throughout procedure Airway Equipment and Method: Bite block Placement Confirmation: positive ETCO2 Dental Injury: Teeth and Oropharynx as per pre-operative assessment

## 2017-04-05 NOTE — Tx Team (Signed)
Interdisciplinary Treatment and Diagnostic Plan Update  04/05/2017 Time of Session: 2:00pm Dillon BlacksmithRobert Henry [name] MRN: [id number]  Principal Diagnosis: Recurrent major depression-severe [facility](HCC)  Secondary Diagnoses: Principal Problem:   Recurrent major depression-severe (HCC) Active Problems:   Benign prostatic hyperplasia with urinary obstruction   OCD (obsessive compulsive disorder)   Current Medications:  Current Facility-Administered Medications  Medication Dose Route Frequency Provider Last Rate Last Dose  . acetaminophen (TYLENOL) tablet 650 mg  650 mg Oral Q6H PRN Clapacs, John T, MD      . alum & mag hydroxide-simeth (MAALOX/MYLANTA) 200-200-20 MG/5ML suspension 30 mL  30 mL Oral Q4H PRN Clapacs, John T, MD      . fluvoxaMINE (LUVOX) tablet 200 mg  200 mg Oral QHS Pucilowska, Jolanta B, MD   200 mg at 04/04/17 2212  . magnesium hydroxide (MILK OF MAGNESIA) suspension 30 mL  30 mL Oral Daily PRN Clapacs, John T, MD      . OLANZapine (ZYPREXA) tablet 5 mg  5 mg Oral QHS Clapacs, Jackquline DenmarkJohn T, MD   5 mg at 04/04/17 2214  . tamsulosin (FLOMAX) capsule 0.4 mg  0.4 mg Oral BID AC Clapacs, John T, MD   0.4 mg at 04/04/17 1700   PTA Medications: Prescriptions Prior to Admission  Medication Sig Dispense Refill Last Dose  . fluvoxaMINE (LUVOX) 100 MG tablet TAKE ONE TABLET BY MOUTH AT BEDTIME 30 tablet 0   . OLANZapine zydis (ZYPREXA) 5 MG disintegrating tablet Take 1 tablet (5 mg total) by mouth at bedtime. 30 tablet 1 Taking  . tamsulosin (FLOMAX) 0.4 MG CAPS capsule Take 1 capsule (0.4 mg total) by mouth daily after supper. (Patient taking differently: Take 0.4 mg by mouth 2 (two) times daily. ) 30 capsule 1 Taking  . traZODone (DESYREL) 50 MG tablet Take 1 tablet (50 mg total) by mouth at bedtime as needed for sleep. 30 tablet 1 Taking   Patient Stressors: Loss of wife of 44 years approximately 2 years ago Medication change or noncompliance  Patient Strengths: Average or above average  intelligence Capable of independent living Supportive family/friends  Treatment Modalities: Medication Management, Group therapy, Case management,  1 to 1 session with clinician, Psychoeducation, Recreational therapy.   Physician Treatment Plan for Primary Diagnosis: Recurrent major depression-severe (HCC) Long Term Goal(s): Improvement in symptoms so as ready for discharge Improvement in symptoms so as ready for discharge   Short Term Goals: Compliance with prescribed medications will improve Ability to identify triggers associated with substance abuse/mental health issues will improve Ability to disclose and discuss suicidal ideas Ability to demonstrate self-control will improve  Medication Management: Evaluate patient's response, side effects, and tolerance of medication regimen.  Therapeutic Interventions: 1 to 1 sessions, Unit Group sessions and Medication administration.  Evaluation of Outcomes: Progressing  Physician Treatment Plan for Secondary Diagnosis: Principal Problem:   Recurrent major depression-severe (HCC) Active Problems:   Benign prostatic hyperplasia with urinary obstruction   OCD (obsessive compulsive disorder)  Long Term Goal(s): Improvement in symptoms so as ready for discharge Improvement in symptoms so as ready for discharge   Short Term Goals: Compliance with prescribed medications will improve Ability to identify triggers associated with substance abuse/mental health issues will improve Ability to disclose and discuss suicidal ideas Ability to demonstrate self-control will improve     Medication Management: Evaluate patient's response, side effects, and tolerance of medication regimen.  Therapeutic Interventions: 1 to 1 sessions, Unit Group sessions and Medication administration.  Evaluation of Outcomes: Progressing  RN Treatment Plan for Primary Diagnosis: Recurrent major depression-severe (HCC) Long Term Goal(s): Knowledge of  disease and therapeutic regimen to maintain health will improve  Short Term Goals: Ability to verbalize feelings will improve, Ability to disclose and discuss suicidal ideas, Ability to identify and develop effective coping behaviors will improve and Compliance with prescribed medications will improve  Medication Management: RN will administer medications as ordered by provider, will assess and evaluate patient's response and provide education to patient for prescribed medication. RN will report any adverse and/or side effects to prescribing provider.  Therapeutic Interventions: 1 on 1 counseling sessions, Psychoeducation, Medication administration, Evaluate responses to treatment, Monitor vital signs and CBGs as ordered, Perform/monitor CIWA, COWS, AIMS and Fall Risk screenings as ordered, Perform wound care treatments as ordered.  Evaluation of Outcomes: Progressing   LCSW Treatment Plan for Primary Diagnosis: Recurrent major depression-severe (HCC) Long Term Goal(s): Safe transition to appropriate next level of care at discharge, Engage patient in therapeutic group addressing interpersonal concerns.  Short Term Goals: Engage patient in aftercare planning with referrals and resources and Increase skills for wellness and recovery  Therapeutic Interventions: Assess for all discharge needs, 1 to 1 time with Social worker, Explore available resources and support systems, Assess for adequacy in community support network, Educate family and significant other(s) on suicide prevention, Complete Psychosocial Assessment, Interpersonal group therapy.  Evaluation of Outcomes: Progressing   Progress in Treatment: Attending groups: Yes. Participating in groups: Yes. Taking medication as prescribed: Yes. Toleration medication: Yes. Family/Significant other contact made: No, will contact:  either son or daughter Patient understands diagnosis: Yes. Discussing patient identified problems/goals  with staff: Yes. Medical problems stabilized or resolved: Yes. Denies suicidal/homicidal ideation: Yes. Issues/concerns per patient self-inventory: No. Other: n/a  New problem(s) identified: None identified at this time.   New Short Term/Long Term Goal(s): None identified at this time.   Discharge Plan or Barriers: Pt will discharge home.  Follow up with maintenance ECT and Medication management and counseling at [facility]  Reason for Continuation of Hospitalization: Anxiety Depression Last ECT treatment today Coordinating aftercare plan Assessing for any unwanted side-effects from ECT    Estimated Length of Stay: 1 day  Attendees: Patient: 04/05/2017 2:26 PM  Physician: Toni Amend 04/05/2017 2:26 PM  Nursing: Shelia Media RN 04/05/2017 2:26 PM  RN Care Manager: 04/05/2017 2:26 PM  Social Worker: Jake Shark, LCSW 04/05/2017 2:26 PM  Recreational Therapist:  04/05/2017 2:26 PM  Other:  04/05/2017 2:26 PM  Other:  04/05/2017 2:26 PM  Other: 04/05/2017 2:26 PM    Scribe for Treatment Team: Glennon Mac, LCSW 04/05/2017 2:26 PM

## 2017-04-05 NOTE — [facility] Group Notes (Signed)
[facility] Group Notes:  (Nursing/MHT/Case Management/Adjunct)  Date:  04/05/2017  Time:  9:33 PM  Type of Therapy:  Group Therapy  Participation Level:  Active  Participation Quality:  Appropriate  Affect:  Appropriate  Cognitive:  Appropriate  Insight:  Appropriate  Engagement in Group:  Engaged  Modes of Intervention:  Discussion  Summary of Progress/Problems:  Burt EkJanice Marie Tyreke Kaeser 04/05/2017, 9:33 PM

## 2017-04-05 NOTE — Procedures (Signed)
ECT SERVICES Physician's Interval Evaluation & Treatment Note  Patient Identification: Dillon BlacksmithRobert Henry [name] MRN:  [id number] Date of Evaluation:  04/05/2017 TX #: [id number]  MADRS:   MMSE:   P.E. Findings:  No change to physical exam vitals were unremarkable heart and lungs normal  Psychiatric Interval Note:  Mood is improved. Still some obsessive anxiety but improved. Functioning better.  Subjective:  Patient is a 66 y.o. male seen for evaluation for Electroconvulsive Therapy. No specific new complaint  Treatment Summary:   [x]   Right Unilateral             []  Bilateral   % Energy : 0.3 ms 70%   Impedance: 1800 ohms  Seizure Energy Index: Computer didn't have a reading but it looks pretty good  Postictal Suppression Index: Also no reading  Seizure Concordance Index: Red is 23% which I think is probably not correct  Medications  Pre Shock: Labetalol 20 mg Toradol 30 mg Brevital 70 mg succinylcholine 80 mg  Post Shock:    Seizure Duration: 22 seconds by EMG 47 seconds by EEG   Comments: I'm anticipating that were probably going to stop the index course today and then plan to see him back for maintenance if possible on July 2   Lungs:  [x]   Clear to auscultation               []  Other:   Heart:    [x]   Regular rhythm             []  irregular rhythm    [x]   Previous H&P reviewed, patient examined and there are NO CHANGES                 []   Previous H&P reviewed, patient examined and there are changes noted.   Mordecai RasmussenJohn Stevey Stapleton, MD 6/20/201810:35 AM

## 2017-04-05 NOTE — Plan of Care (Signed)
Problem: Coping: Goal: Ability to verbalize frustrations and anger appropriately will improve Outcome: Progressing Patient  Verbalized frustration to staff.

## 2017-04-05 NOTE — H&P (Signed)
Dillon [name] is an 66 y.o. male.   Chief Complaint: Patient has no new complaint. Mood is improved. Chronic anxiety but better. Energy level improved. HPI: History of recurrent depression and OCD response to medicine and ECT  Past Medical History:  Diagnosis Date  . Kidney stones   . Meningitis    spinal  . OCD (obsessive compulsive disorder)     Past Surgical History:  Procedure Laterality Date  . KIDNEY STONE SURGERY      Family History  Problem Relation Age of Onset  . Cancer Mother        bladder  . Emphysema Father   . Emphysema Sister   . COPD Sister   . Cancer Brother        prostate   Social History:  reports that he has never smoked. He has never used smokeless tobacco. He reports that he does not drink alcohol or use drugs.  Allergies:  Allergies  Allergen Reactions  . Codeine Other (See Comments)    Unsure of reaction    Medications Prior to Admission  Medication Sig Dispense Refill  . fluvoxaMINE (LUVOX) 100 MG tablet TAKE ONE TABLET BY MOUTH AT BEDTIME 30 tablet 0  . OLANZapine zydis (ZYPREXA) 5 MG disintegrating tablet Take 1 tablet (5 mg total) by mouth at bedtime. 30 tablet 1  . tamsulosin (FLOMAX) 0.4 MG CAPS capsule Take 1 capsule (0.4 mg total) by mouth daily after supper. (Patient taking differently: Take 0.4 mg by mouth 2 (two) times daily. ) 30 capsule 1  . traZODone (DESYREL) 50 MG tablet Take 1 tablet (50 mg total) by mouth at bedtime as needed for sleep. 30 tablet 1    Results for orders placed or performed during the hospital encounter of 03/22/17 (from the past 48 hour(s))  Glucose, capillary     Status: Abnormal   Collection Time: 04/05/17  6:30 AM  Result Value Ref Range   Glucose-Capillary 116 (H) 65 - 99 mg/dL   Comment 1 Notify RN    Comment 2 Document in Chart    No results found.  Review of Systems  Constitutional: Negative.   HENT: Negative.   Eyes: Negative.   Respiratory: Negative.   Cardiovascular: Negative.    Gastrointestinal: Negative.   Musculoskeletal: Negative.   Skin: Negative.   Neurological: Negative.   Psychiatric/Behavioral: Positive for memory loss. Negative for depression and suicidal ideas. The patient is nervous/anxious.     Blood pressure 140/83, pulse 66, temperature 97.8 F (36.6 C), temperature source Oral, resp. rate 18, height 5\' 9"  (1.753 m), weight 187 lb (84.8 kg), SpO2 96 %. Physical Exam  Nursing note and vitals reviewed. Constitutional: He appears well-developed and well-nourished.  HENT:  Head: Normocephalic and atraumatic.  Eyes: Conjunctivae are normal. Pupils are equal, round, and reactive to light.  Neck: Normal range of motion.  Cardiovascular: Regular rhythm and normal heart sounds.   Respiratory: Effort normal. No respiratory distress.  GI: Soft.  Musculoskeletal: Normal range of motion.  Neurological: He is alert.  Skin: Skin is warm and dry.  Psychiatric: Judgment normal. His affect is blunt. His speech is delayed. He is slowed. Thought content is not paranoid. He expresses no homicidal and no suicidal ideation. He exhibits abnormal recent memory.     Assessment/Plan Treatment today and then he will likely be ready for discharge within the next day or so with follow-up hopefully to be arranged as an outpatient  Mordecai RasmussenJohn Dystany Duffy, MD 04/05/2017, 10:34 AM

## 2017-04-05 NOTE — Progress Notes (Signed)
Pt had ECT. Alert and orient x4 post ECT. No complaints or concerns. Ate lunch with no complaints. Pt quiet, isolative to self and room.  Encouragement and support offered. Safety checks maintained. Pt receptive and remains safe on unit with q 15 min checks.

## 2017-04-05 NOTE — Transfer of Care (Addendum)
Immediate Anesthesia Transfer of Care Note  Patient: Dillon [name]  Procedure(s) Performed: ECT  Patient Location: PACU  Anesthesia Type:General  Level of Consciousness: sedated  Airway & Oxygen Therapy: Patient Spontanous Breathing  Post-op Assessment: Report given to RN and Post -op Vital signs reviewed and stable  Post vital signs: Reviewed and stable  Last Vitals:  Vitals:   04/05/17 1115 04/05/17 1128  BP: 126/89 121/83  Pulse: 68 63  Resp: (!) 23 18  Temp:  36.6 C    Last Pain:  Vitals:   04/05/17 1128  TempSrc:   PainSc: 0-No pain         Complications: No apparent anesthesia complications

## 2017-04-05 NOTE — Anesthesia Postprocedure Evaluation (Signed)
Anesthesia Post Note  Patient: Dillon [name]  Procedure(s) Performed: * No procedures listed *  Patient location during evaluation: PACU Anesthesia Type: General Level of consciousness: awake Pain management: pain level controlled Vital Signs Assessment: post-procedure vital signs reviewed and stable Respiratory status: spontaneous breathing Cardiovascular status: stable Anesthetic complications: no     Last Vitals:  Vitals:   04/05/17 1115 04/05/17 1128  BP: 126/89 121/83  Pulse: 68 63  Resp: (!) 23 18  Temp:  36.6 C    Last Pain:  Vitals:   04/05/17 1128  TempSrc:   PainSc: 0-No pain                 VAN STAVEREN,Tynleigh Birt

## 2017-04-05 NOTE — Progress Notes (Signed)
Pt given discharge sheet and  went over information with pt  for outpt ECT treatment.  Appointment date given to pt and informed pt if unable  to make appointment to please call [phone].  Pt verbalized understanding of instructions given.

## 2017-04-06 MED ORDER — FLUVOXAMINE MALEATE 100 MG PO TABS: 200.0000 mg | ORAL_TABLET | Freq: Every day | ORAL | 1 refills | Status: DC

## 2017-04-06 MED ORDER — OLANZAPINE 5 MG PO TABS: 5.0000 mg | ORAL_TABLET | Freq: Every day | ORAL | 1 refills | Status: DC

## 2017-04-06 MED ORDER — TAMSULOSIN HCL 0.4 MG PO CAPS: 0.4000 mg | ORAL_CAPSULE | Freq: Two times a day (BID) | ORAL | 1 refills | Status: DC

## 2017-04-06 NOTE — [facility] Group Notes (Signed)
[facility] LCSW Group Therapy  04/06/2017 10:37 AM  Type of Therapy:  Group Therapy  Participation Level:  Patient did not attend group. CSW invited patient to group.   Summary of Progress/Problems: Balance in life: Patients will discuss the concept of balance and how it looks and feels to be unbalanced. Pt will identify areas in their life that is unbalanced and ways to become more balanced. They discussed what aspects in their lives has influenced their self care. Patients also discussed self care in the areas of self regulation/control, hygiene/appearance, sleep/relaxation, healthy leisure, healthy eating habits, exercise, inner peace/spirituality, self improvement, sobriety, and health management. They were challenged to identify changes that are needed in order to improve self care.  Burley Kopka G. Garnette CzechSampson MSW, LCSWA 04/06/2017, 10:37 AM

## 2017-04-06 NOTE — Discharge Summary (Signed)
Physician Discharge Summary Note  Patient:  Dillon [name] is an 66 y.o., male MRN:  [id number] DOB:  March 27, 1951 Patient phone:  [phone] (home)  Patient address:   [address] [place] [address],  Total Time spent with patient: 1 hour  Date of Admission:  03/22/2017 Date of Discharge: 04/06/2017  Reason for Admission:  Admitted because of worsening major depression accompanied by OCD resulting in weight loss, malnutrition, poor self-care. Had not been taking medicine or taking care of himself at home. Consideration for return to ECT treatment  Principal Problem: Recurrent major depression-severe [facility]) Discharge Diagnoses: Patient Active Problem List   Diagnosis Date Noted  . Recurrent major depression-severe (HCC) [F33.2] 03/22/2017  . Hospital discharge follow-up [Z09] 02/03/2016  . Suicidal ideation [R45.851] 01/04/2016  . Severe recurrent major depression without psychotic features (HCC) [F33.2] 12/15/2015  . Grief [F43.20] 12/15/2015  . Weight loss [R63.4] 12/15/2015  . OCD (obsessive compulsive disorder) [F42.9] 12/15/2015  . Benign prostatic hyperplasia with urinary obstruction [N40.1, N13.8] 11/24/2015  . 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 No history of suicide attempts positive past history of suicidal thinking positive past history of good response to ECT  Past Medical History:  Past Medical History:  Diagnosis Date  . Kidney stones   . Meningitis    spinal  . OCD (obsessive compulsive disorder)     Past Surgical History:  Procedure Laterality Date  . KIDNEY STONE SURGERY     Family History:  Family History  Problem Relation Age of Onset  . Cancer Mother        bladder  . Emphysema Father   . Emphysema Sister   . COPD Sister   . Cancer Brother        prostate   Family Psychiatric  History: Depression Social History:  History  Alcohol  Use No     History  Drug Use No    Social History   Social History  . Marital status: Widowed    Spouse name: N/A  . Number of children: N/A  . Years of education: N/A   Social History Main Topics  . Smoking status: Never Smoker  . Smokeless tobacco: Never Used  . Alcohol use No  . Drug use: No  . Sexual activity: Not Currently   Other Topics Concern  . None   Social History Narrative  . None    Hospital Course:  Patient was admitted to the psychiatric service and gave consent to begin ECT. Continued medication management primarily with Luvox and olanzapine. Patient received 6 ECT treatments right unilateral all tolerated well. Side effects of minor memory impairment. Patient showed significant improvement with subjective improvement in mood and objective improvement in energy activity affect and oral intake. Patient is denying any suicidal thoughts. Not showing signs of psychosis. Cooperative with treatment. Patient seems to have achieved a return to a normal baseline. He will be discharged from the hospital and continued on his current medicine with referral to outpatient treatment in the community. We would also like to see him back for maintenance ECT beginning on June 2.  Physical Findings: AIMS: Facial and Oral Movements Muscles of Facial Expression: Minimal Lips and Perioral Area: Minimal Jaw: Minimal Tongue: None, normal,Extremity Movements Upper (arms, wrists, hands, fingers): None, normal Lower (legs, knees, ankles, toes): None, normal, Trunk Movements Neck, shoulders, hips: None, normal, Overall Severity Severity of abnormal movements (  highest score from questions above): None, normal Incapacitation due to abnormal movements: None, normal Patient's awareness of abnormal movements (rate only patient's report): No Awareness, Dental Status Current problems with teeth and/or dentures?: No Does patient usually wear dentures?: No  CIWA:    COWS:      Musculoskeletal: Strength & Muscle Tone: within normal limits Gait & Station: normal Patient leans: N/A  Psychiatric Specialty Exam: Physical Exam  Nursing note and vitals reviewed. Constitutional: He appears well-developed and well-nourished.  HENT:  Head: Normocephalic and atraumatic.  Eyes: Conjunctivae are normal. Pupils are equal, round, and reactive to light.  Neck: Normal range of motion.  Cardiovascular: Regular rhythm and normal heart sounds.   Respiratory: Effort normal and breath sounds normal.  GI: Soft.  Musculoskeletal: Normal range of motion.  Neurological: He is alert.  Skin: Skin is warm and dry.  Psychiatric: His speech is not slurred. He is slowed. Thought content is not paranoid. He does not express impulsivity. He does not exhibit a depressed mood. He expresses no homicidal and no suicidal ideation. He exhibits abnormal recent memory.    Review of Systems  Constitutional: Negative.   HENT: Negative.   Eyes: Negative.   Respiratory: Negative.   Cardiovascular: Negative.   Gastrointestinal: Negative.   Musculoskeletal: Negative.   Skin: Negative.   Neurological: Negative.   Psychiatric/Behavioral: Positive for memory loss. Negative for depression, hallucinations, substance abuse and suicidal ideas. The patient is not nervous/anxious and does not have insomnia.     Blood pressure 117/75, pulse 81, temperature 98 F (36.7 C), temperature source Oral, resp. rate 18, height 5\' 9"  (1.753 m), weight 187 lb (84.8 kg), SpO2 95 %.Body mass index is 27.62 kg/m.  General Appearance: Casual  Eye Contact:  Good  Speech:  Slow  Volume:  Normal  Mood:  Euthymic  Affect:  Constricted  Thought Process:  Goal Directed  Orientation:  Full (Time, Place, and Person)  Thought Content:  Logical  Suicidal Thoughts:  No  Homicidal Thoughts:  No  Memory:  Immediate;   Fair Recent;   Fair Remote;   Fair  Judgement:  Fair  Insight:  Fair  Psychomotor Activity:  Normal   Concentration:  Concentration: Fair  Recall:  Fair  Fund of Knowledge:  Fair  Language:  Fair  Akathisia:  No  Handed:  Right  AIMS (if indicated):     Assets:  Desire for Improvement Housing Physical Health Social Support  ADL's:  Intact  Cognition:  Impaired,  Mild  Sleep:  Number of Hours: 7.5     Have you used any form of tobacco in the last 30 days? (Cigarettes, Smokeless Tobacco, Cigars, and/or Pipes): No  Has this patient used any form of tobacco in the last 30 days? (Cigarettes, Smokeless Tobacco, Cigars, and/or Pipes) Yes, No  Blood Alcohol level:  Lab Results  Component Value Date   [facility] <5 01/04/2016   ETH <5 12/15/2015    Metabolic Disorder Labs:  Lab Results  Component Value Date   HGBA1C 4.8 03/27/2017   MPG 91 03/27/2017   Lab Results  Component Value Date   PROLACTIN 29.7 (H) 01/05/2016   Lab Results  Component Value Date   CHOL 137 03/27/2017   TRIG 117 03/27/2017   HDL 30 (L) 03/27/2017   CHOLHDL 4.6 03/27/2017   VLDL 23 03/27/2017   LDLCALC 84 03/27/2017   LDLCALC 79 01/05/2016    See Psychiatric Specialty Exam and Suicide Risk Assessment completed by Attending Physician  prior to discharge.  Discharge destination:  Home  Is patient on multiple antipsychotic therapies at discharge:  No   Has Patient had three or more failed trials of antipsychotic monotherapy by history:  No  Recommended Plan for Multiple Antipsychotic Therapies: NA  Discharge Instructions    Diet - low sodium heart healthy    Complete by:  As directed    Increase activity slowly    Complete by:  As directed      Allergies as of 04/06/2017      Reactions   Codeine Other (See Comments)   Unsure of reaction      Medication List    STOP taking these medications   OLANZapine zydis 5 MG disintegrating tablet Commonly known as:  ZYPREXA Replaced by:  OLANZapine 5 MG tablet   traZODone 50 MG tablet Commonly known as:  DESYREL     TAKE these medications      Indication  fluvoxaMINE 100 MG tablet Commonly known as:  LUVOX Take 2 tablets (200 mg total) by mouth at bedtime. What changed:  See the new instructions.  Indication:  Depression, Obsessive Compulsive Disorder   OLANZapine 5 MG tablet Commonly known as:  ZYPREXA Take 1 tablet (5 mg total) by mouth at bedtime. Replaces:  OLANZapine zydis 5 MG disintegrating tablet  Indication:  Major Depressive Disorder, Obsessive Compulsive Disorder   tamsulosin 0.4 MG Caps capsule Commonly known as:  FLOMAX Take 1 capsule (0.4 mg total) by mouth 2 (two) times daily before a meal. What changed:  when to take this  Indication:  Benign Enlargement of Prostate      Follow-up Information    [facility] Go on 04/10/2017.   Why:  8:00am, Please take your insurance information and identification.  Please call to reschedule appointment if you are unable to make it as currently scheduled. Contact information: [address] [place] [address] [phone]        Dr. Abbe Amsterdamlapacs-Outpatient Maintainance ECT. Go on 04/17/2017.   Why:  ECT staff will be in touch with you to notify you of what time you need to arrive for procedure. Contact information: Outpatient Surgery [address] [place] [address] [phone]          Follow-up recommendations:  Activity:  Activity as tolerated Diet:   regular diet Other:  Follow-up with outpatient medication management and follow-up with maintenance ECT treatment with appointment on June 2 which is a Monday  Comments:  Patient counseled about discharge plan and agrees  Signed: Mordecai RasmussenJohn Ailen Strauch, MD 04/06/2017, 12:32 PM

## 2017-04-06 NOTE — BHH Suicide Risk Assessment (Signed)
BActivity:    Diet:    Other:   HH Discharge Suicide Risk Assessment   Principal Problem: Recurrent major depression-severe [facility](HCC) Discharge Diagnoses:  Patient Active Problem List   Diagnosis Date Noted  . Recurrent major depression-severe (HCC) [F33.2] 03/22/2017  . Hospital discharge follow-up [Z09] 02/03/2016  . Suicidal ideation [R45.851] 01/04/2016  . Severe recurrent major depression without psychotic features (HCC) [F33.2] 12/15/2015  . Grief [F43.20] 12/15/2015  . Weight loss [R63.4] 12/15/2015  . OCD (obsessive compulsive disorder) [F42.9] 12/15/2015  . Benign prostatic hyperplasia with urinary obstruction [N40.1, N13.8] 11/24/2015  . 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   HENT: Negative.   Eyes: Negative.   Respiratory: Negative.   Cardiovascular: Negative.   Gastrointestinal: Negative.   Musculoskeletal: Negative.   Skin: Negative.   Neurological: Negative.   Psychiatric/Behavioral: Positive for memory loss. Negative for depression, hallucinations, substance abuse and suicidal ideas. The patient is not nervous/anxious and does not have insomnia.     Blood pressure 117/75, pulse 81, temperature 98 F (36.7 C), temperature source Oral, resp. rate 18, height 5\' 9"  (1.753 m), weight 187 lb (84.8 kg), SpO2 95 %.Body mass index is 27.62 kg/m.  General Appearance: Casual  Eye Contact::  Good  Speech:  Normal Rate409  Volume:  Normal  Mood:  Euthymic  Affect:  Constricted  Thought Process:  Goal Directed  Orientation:  Full (Time, Place, and Person)  Thought Content:  Logical  Suicidal Thoughts:  No  Homicidal Thoughts:  No  Memory:  Immediate;   Good Recent;   Fair Remote;   Fair  Judgement:  Fair  Insight:  Fair   Psychomotor Activity:  Decreased  Concentration:  Fair  Recall:  FiservFair  Fund of Knowledge:Fair  Language: Fair  Akathisia:  No  Handed:  Right  AIMS (if indicated):     Assets:  Desire for Improvement Housing Physical Health Resilience Social Support  Sleep:  Number of Hours: 7.5  Cognition: WNL  ADL's:  Intact   Mental Status Per Nursing Assessment::   On Admission:     Demographic Factors:  Male, Divorced or widowed, Caucasian, Living alone and Unemployed  Loss Factors: Loss of significant relationship  Historical Factors: NA  Risk Reduction Factors:   Sense of responsibility to family, Religious beliefs about death, Positive social support and Positive therapeutic relationship  Continued Clinical Symptoms:  Depression:   Insomnia  Cognitive Features That Contribute To Risk:  Loss of executive function    Suicide Risk:  Mild:  Suicidal ideation of limited frequency, intensity, duration, and specificity.  There are no identifiable plans, no associated intent, mild dysphoria and related symptoms, good self-control (both objective and subjective assessment), few other risk factors, and identifiable protective factors, including available and accessible social support.  Follow-up Information    [facility] Go on 04/10/2017.   Why:  8:00am, Please take your insurance information and identification.  Please call to reschedule appointment if you are unable to make it as currently scheduled. Contact information: [address] [place] [address] [phone]        Dr. Abbe Amsterdamlapacs-Outpatient Maintainance ECT. Go on 04/17/2017.   Why:  ECT staff will be in touch with you to notify you of what time you need to arrive for  procedure. Contact information: Outpatient Surgery [address] [place] [address] [phone]          Plan Of CarActivity as toleratedollow-up recommendations:  Activity:  Regular diet and activity as  tolerated Diet:  Regular diet Other:  Activity as tolerated. Patient is to follow-up with outpatient provider in the community and to come back and see [place] for maintenance ECT  [name], MD 04/06/2017, 12:27 PM

## 2017-04-06 NOTE — Progress Notes (Signed)
Patient discharged from the unit on above date/time. Affect appropriate for situation, alert and responsive with a steady gait. Patient's personal items, Rx's, home medications and discharge instructions [facility] transition record, Suicide Risk Assessment and AVS) given to patient upon discharge. Patient verbalized understanding of the instructions provided by this Clinical research associatewriter. No complaints of voiced.

## 2017-04-06 NOTE — Progress Notes (Signed)
  [facility] Discharge [facility] :  Will you be returning to the same living situation after discharge:  Yes,  home with support from children.  At discharge, do you have transportation home?: Yes,  family Do you have the ability to pay for your medications: Yes,  patient has insurance  Release of information consent forms completed and in the chart;  Patient's signature needed at discharge.  Patient to Follow up at: Follow-up Information    [facility] Go on 04/10/2017.   Why:  8:00am, Please take your insurance information and identification.  Please call to reschedule appointment if you are unable to make it as currently scheduled. Contact information: [address] [place] [address] [phone]        Dr. Abbe Amsterdamlapacs-[facility]patient [facility]aint[facility]ance ECT. Go on 04/17/2017.   Why:  ECT staff will be in touch with you to notify you of what time you need to arrive for procedure. Contact information: Outpatient Surgery [address] [place] [address] [phone]          Next level of care provider has [facility] to [facility] Link:no  Safety Planning and Suicide Prevention discussed: Yes,  with patient and his children.   Have you used any form of tobacco in the last 30 days? (Cigarettes, Smokeless Tobacco, Cigars, and/or Pipes): No  Has patient been referred to the Quitline?: N/A patient is not a smoker  Patient has been referred for addiction treatment: Yes  Estefan Pattison G. Garnette CzechSampson MSW, LCSWA 04/06/2017, 3:05 PM

## 2017-04-06 NOTE — Progress Notes (Signed)
Pt appeared to sleep about 8.5 hours while monitored on 15 minute safety checks. 

## 2017-04-06 NOTE — [facility]HH Group Notes (Signed)
[facility] LCSW Group Therapy Note  Type of Therapy and Topic:  Group Therapy:  Goals Group: SMART Goals  Participation Level:  Patient attended group on this date and minimally participated in the group discussion.   Description of Group:   The purpose of a daily goals group is to assist and guide patients in setting recovery/wellness-related goals.  The objective is to set goals as they relate to the crisis in which they were admitted. Patients will be using SMART goal modalities to set measurable goals.  Characteristics of realistic goals will be discussed and patients will be assisted in setting and processing how one will reach their goal. Facilitator will also assist patients in applying interventions and coping skills learned in psycho-education groups to the SMART goal and process how one will achieve defined goal.  Therapeutic Goals: -Patients will develop and document one goal related to or their crisis in which brought them into treatment. -Patients will be guided by LCSW using SMART goal setting modality in how to set a measurable, attainable, realistic and time sensitive goal.  -Patients will process barriers in reaching goal. -Patients will process interventions in how to overcome and successful in reaching goal.   Summary of Patient Progress:  Patient Goal: "I want to work on staying focused". CSW challenged pt to be more specific and to discuss what his schedule will be when he returns home. CSW discussed with patient the importance of taking medications as prescribed and attending all follow-up appointments. Pt stated he will communicate with his children more if he starts to become depressed or anxious again.    Therapeutic Modalities:   Motivational Interviewing Engineer, manufacturing systemsCognitive Behavioral Therapy Crisis Intervention Model SMART goals setting  Kimla Furth G. Garnette CzechSampson MSW, [facility] AllenCSWA 04/06/2017 10:37 AM

## 2017-04-06 NOTE — Progress Notes (Signed)
D: Pt slightly more spontaneous in conversation.  Up to med room to get med's and asks for reminders about effect of medication as well as asked staff if they needed help getting water for med pass.  Pt reports he is somewhat improved in mood.  Denies s/i, h/i or hallucinations. A: Pt given positive feedback for improvement.  Monitored on 15 minute safety checks and maintained safety.   R: Pt displays an improved mood, more spontaneous.  More active in milieu Cont tx plan..Marland Kitchen

## 2017-04-06 NOTE — Progress Notes (Signed)
Patient up ad lib with a steady gait, observed in the dayroom with minimal interaction with peers. Patient reports that his appetite was good in the past 24 hours, energy level normal, denies depression. Rated his hopelessness and anxiety at a 3. His goal for today was to focus more. Milieu remains safe, q 15 minutes checks performed. Will continue to monitor.

## 2017-04-06 NOTE — Plan of Care (Signed)
Problem: Safety: Goal: Ability to remain free from injury will improve Outcome: Progressing Patient remains free from injury on the unit.   

## 2017-04-07 ENCOUNTER — Telehealth: Payer: Self-pay | Admitting: *Deleted

## 2017-04-16 ENCOUNTER — Other Ambulatory Visit: Payer: Self-pay | Admitting: Psychiatry

## 2017-04-17 ENCOUNTER — Ambulatory Visit: Payer: Self-pay | Admitting: Anesthesiology

## 2017-04-17 ENCOUNTER — Other Ambulatory Visit: Payer: Self-pay | Admitting: Psychiatry

## 2017-04-17 ENCOUNTER — Encounter
Admission: RE | Admit: 2017-04-17 | Discharge: 2017-04-17 | Disposition: A | Discharge status: Home / Self Care | Payer: BLUE CROSS/BLUE SHIELD | Source: Ambulatory Visit | Attending: Psychiatry | Admitting: Psychiatry

## 2017-04-17 ENCOUNTER — Encounter: Payer: Self-pay | Admitting: Anesthesiology

## 2017-04-17 DIAGNOSIS — F329 Major depressive disorder, single episode, unspecified: Secondary | ICD-10-CM | POA: Diagnosis not present

## 2017-04-17 DIAGNOSIS — F332 Major depressive disorder, recurrent severe without psychotic features: Secondary | ICD-10-CM

## 2017-04-17 DIAGNOSIS — F429 Obsessive-compulsive disorder, unspecified: Secondary | ICD-10-CM | POA: Insufficient documentation

## 2017-04-17 MED ORDER — FENTANYL CITRATE (PF) 100 MCG/2ML IJ SOLN: 25.0000 ug | INTRAMUSCULAR | Status: DC | PRN

## 2017-04-17 MED ORDER — KETOROLAC TROMETHAMINE 30 MG/ML IJ SOLN
30.0000 mg | Freq: Once | INTRAMUSCULAR | Status: AC
  Administered 2017-04-17: 30 mg via INTRAVENOUS

## 2017-04-17 MED ORDER — ESMOLOL HCL 100 MG/10ML IV SOLN
INTRAVENOUS | Status: AC
  Filled 2017-04-17: qty 10

## 2017-04-17 MED ORDER — LABETALOL HCL 5 MG/ML IV SOLN
INTRAVENOUS | Status: AC
Start: 2017-04-17 — End: 2017-04-17
  Filled 2017-04-17: qty 4

## 2017-04-17 MED ORDER — SODIUM CHLORIDE 0.9 % IV SOLN
500.0000 mL | Freq: Once | INTRAVENOUS | Status: AC
  Administered 2017-04-17: 500 mL via INTRAVENOUS

## 2017-04-17 MED ORDER — LABETALOL HCL 5 MG/ML IV SOLN
INTRAVENOUS | Status: DC | PRN
  Administered 2017-04-17: 20 mg via INTRAVENOUS

## 2017-04-17 MED ORDER — SUCCINYLCHOLINE CHLORIDE 20 MG/ML IJ SOLN
INTRAMUSCULAR | Status: DC | PRN
  Administered 2017-04-17: 80 mg via INTRAVENOUS

## 2017-04-17 MED ORDER — METHOHEXITAL SODIUM 100 MG/10ML IV SOSY
PREFILLED_SYRINGE | INTRAVENOUS | Status: DC | PRN
  Administered 2017-04-17: 70 mg via INTRAVENOUS

## 2017-04-17 MED ORDER — ESMOLOL HCL 100 MG/10ML IV SOLN
INTRAVENOUS | Status: DC | PRN
  Administered 2017-04-17: 20 mg via INTRAVENOUS

## 2017-04-17 MED ORDER — KETOROLAC TROMETHAMINE 30 MG/ML IJ SOLN
INTRAMUSCULAR | Status: AC
  Filled 2017-04-17: qty 1

## 2017-04-17 MED ORDER — ONDANSETRON HCL 4 MG/2ML IJ SOLN
4.0000 mg | Freq: Once | INTRAMUSCULAR | Status: DC | PRN
Start: 2017-04-17 — End: 2017-04-18

## 2017-04-17 MED ORDER — SODIUM CHLORIDE 0.9 % IV SOLN
INTRAVENOUS | Status: DC | PRN
  Administered 2017-04-17: 11:00:00 via INTRAVENOUS

## 2017-04-17 NOTE — Procedures (Signed)
ECT SERVICES Physician's Interval Evaluation & Treatment Note  Patient Identification: Burman BlacksmithRobert Henry Macconnell MRN:  [id number] Date of Evaluation:  04/17/2017 TX #: 7  MADRS: 28  MMSE: 30  P.E. Findings:  No change to physical exam heart and lungs normal vitals normal.  Psychiatric Interval Note:  Mood is good upbeat energy is much better eating well. No suicidal thoughts no sign of psychosis. Still has OCD at baseline  Subjective:  Patient is a 66 y.o. male seen for evaluation for Electroconvulsive Therapy. Feeling pretty good wants to work on returning to driving and normal routine  Treatment Summary:   [x]   Right Unilateral             []  Bilateral   % Energy : 0.3 ms 70%   Impedance: 640 ohms  Seizure Energy Index: No reading  Postictal Suppression Index: No reading  Seizure Concordance Index: 87%  Medications  Pre Shock: Labetalol 20 mg Toradol 30 mg Brevital 70 mg succinylcholine 80 mg  Post Shock:    Seizure Duration: 20 seconds by EMG 97 seconds by EEG   Comments: Because of some interference computer did not have a very good reading of the seizure but there was clearly a extended seizure with good concordance and high energy   Lungs:  [x]   Clear to auscultation               []  Other:   Heart:    [x]   Regular rhythm             []  irregular rhythm    [x]   Previous H&P reviewed, patient examined and there are NO CHANGES                 []   Previous H&P reviewed, patient examined and there are changes noted.   Mordecai RasmussenJohn Montel Vanderhoof, MD 7/2/201810:33 AM

## 2017-04-17 NOTE — Anesthesia Postprocedure Evaluation (Signed)
Anesthesia Post Note  Patient: Dillon [name]  Procedure(s) Performed: * No procedures listed *  Patient location during evaluation: PACU Anesthesia Type: General Level of consciousness: awake and alert Pain management: pain level controlled Vital Signs Assessment: post-procedure vital signs reviewed and stable Respiratory status: spontaneous breathing, nonlabored ventilation, respiratory function stable and patient connected to nasal cannula oxygen Cardiovascular status: blood pressure returned to baseline and stable Postop Assessment: no signs of nausea or vomiting Anesthetic complications: no     Last Vitals:  Vitals:   04/17/17 1115 04/17/17 1129  BP: 102/74 109/69  Pulse: 64 62  Resp: 17 16  Temp:      Last Pain:  Vitals:   04/17/17 1129  TempSrc:   PainSc: 0-No pain                 Andria Head S

## 2017-04-17 NOTE — H&P (Signed)
Dillon BlacksmithRobert Henry [name] is an 66 y.o. male.   Chief Complaint: No new complaints. Feeling good. Mood upbeat. OCD present but not profound HPI: History of depression and OCD responded well to medicine and treatment  Past Medical History:  Diagnosis Date  . Kidney stones   . Meningitis    spinal  . OCD (obsessive compulsive disorder)     Past Surgical History:  Procedure Laterality Date  . KIDNEY STONE SURGERY      Family History  Problem Relation Age of Onset  . Cancer Mother        bladder  . Emphysema Father   . Emphysema Sister   . COPD Sister   . Cancer Brother        prostate   Social History:  reports that he has never smoked. He has never used smokeless tobacco. He reports that he does not drink alcohol or use drugs.  Allergies:  Allergies  Allergen Reactions  . Codeine Other (See Comments)    Unsure of reaction     (Not in a hospital admission)  No results found for this or any previous visit (from the past 48 hour(s)). No results found.  Review of Systems  Constitutional: Negative.   HENT: Negative.   Eyes: Negative.   Respiratory: Negative.   Cardiovascular: Negative.   Gastrointestinal: Negative.   Musculoskeletal: Negative.   Skin: Negative.   Neurological: Negative.   Psychiatric/Behavioral: Negative.     Blood pressure (!) 136/91, pulse 85, temperature 97.8 F (36.6 C), temperature source Oral, resp. rate 18, height 5\' 9"  (1.753 m), weight 188 lb (85.3 kg), SpO2 97 %. Physical Exam  Nursing note and vitals reviewed. Constitutional: He appears well-developed and well-nourished.  HENT:  Head: Normocephalic and atraumatic.  Eyes: Conjunctivae are normal. Pupils are equal, round, and reactive to light.  Neck: Normal range of motion.  Cardiovascular: Regular rhythm and normal heart sounds.   Respiratory: Effort normal. No respiratory distress.  GI: Soft. He exhibits no distension.  Musculoskeletal: Normal range of motion.  Neurological: He is  alert.  Skin: Skin is warm and dry.  Psychiatric: He has a normal mood and affect. His behavior is normal. Judgment and thought content normal.     Assessment/Plan We are going to see him back in about 5 weeks and meanwhile he will continue his current medication. Reviewed with him behavior plans for returning to driving.  Mordecai RasmussenJohn Clapacs, MD 04/17/2017, 10:31 AM

## 2017-04-17 NOTE — Anesthesia Preprocedure Evaluation (Signed)
Anesthesia Evaluation  Patient identified by MRN, date of birth, ID band Patient awake    Reviewed: Allergy & Precautions, NPO status , Patient's Chart, lab work & pertinent test results, reviewed documented beta blocker date and time   Airway Mallampati: II  TM Distance: >3 FB     Dental  (+) Chipped   Pulmonary           Cardiovascular      Neuro/Psych PSYCHIATRIC DISORDERS Depression    GI/Hepatic   Endo/Other    Renal/GU Renal disease     Musculoskeletal   Abdominal   Peds  Hematology   Anesthesia Other Findings   Reproductive/Obstetrics                             Anesthesia Physical Anesthesia Plan  ASA: III  Anesthesia Plan: General   Post-op Pain Management:    Induction: Intravenous  PONV Risk Score and Plan:   Airway Management Planned:   Additional Equipment:   Intra-op Plan:   Post-operative Plan:   Informed Consent: I have reviewed the patients History and Physical, chart, labs and discussed the procedure including the risks, benefits and alternatives for the proposed anesthesia with the patient or authorized representative who has indicated his/her understanding and acceptance.     Plan Discussed with: CRNA  Anesthesia Plan Comments:         Anesthesia Quick Evaluation

## 2017-04-17 NOTE — Anesthesia Post-op Follow-up Note (Cosign Needed)
Anesthesia QCDR form completed.        

## 2017-04-17 NOTE — Discharge Instructions (Signed)
1)  The drugs that you have been given will stay in your system until tomorrow so for the       next 24 hours you should not:  A. Drive an automobile  B. Make any legal decisions  C. Drink any alcoholic beverages  2)  You may resume your regular meals upon return home.  3)  A responsible adult must take you home.  Someone should stay with you for a few          hours, then be available by phone for the remainder of the treatment day.  4)  You May experience any of the following symptoms:  Headache, Nausea and a dry mouth (due to the medications you were given),  temporary memory loss and some confusion, or sore muscles (a warm bath  should help this).  If you you experience any of these symptoms let us know on                your return visit.  5)  Report any of the following: any acute discomfort, severe headache, or temperature        greater than 100.5 F.   Also report any unusual redness, swelling, drainage, or pain         at your IV site.    You may report Symptoms to:  ECT PROGRAM- [place] [facility]          Phone: [phone], ECT Department           or Dr. Shary Keylapac's office [phone]  6)  Your next ECT Treatment is Day Monday  Date May 22, 2017  We will call 2 days prior to your scheduled appointment for arrival times.  7)  Nothing to eat or drink after midnight the night before your procedure.  8)  Take .   With a sip of water the morning of your procedure.  9)  Other Instructions: Call [phone] to cancel the morning of your procedure due         to illness or emergency.  10) We will call within 72 hours to assess how you are feeling.

## 2017-04-17 NOTE — Transfer of Care (Signed)
Immediate Anesthesia Transfer of Care Note  Patient: Dillon [name]  Procedure(s) Performed: ect  Patient Location: PACU  Anesthesia Type:General  Level of Consciousness: awake and alert   Airway & Oxygen Therapy: Patient Spontanous Breathing and Patient connected to face mask oxygen  Post-op Assessment: Report given to RN and Post -op Vital signs reviewed and stable  Post vital signs: Reviewed  Last Vitals:  Vitals:   04/17/17 0854 04/17/17 1054  BP: (!) 136/91 113/68  Pulse: 85 81  Resp: 18 16  Temp: 36.6 C 36.1 C    Last Pain:  Vitals:   04/17/17 0854  TempSrc: Oral         Complications: No apparent anesthesia complications

## 2017-05-09 ENCOUNTER — Telehealth (HOSPITAL_COMMUNITY): Payer: Self-pay | Admitting: *Deleted

## 2017-05-09 NOTE — Telephone Encounter (Signed)
[phone] to extend days for ECT authorization. Spoke with Priscille KluverKim E. She extended the previous 8 sessions to end on 06/16/17 giving 5 more sessions. Authorization #[id number].

## 2017-05-19 ENCOUNTER — Telehealth: Payer: Self-pay | Admitting: *Deleted

## 2017-05-22 ENCOUNTER — Other Ambulatory Visit: Payer: Self-pay | Admitting: Psychiatry

## 2017-05-22 ENCOUNTER — Encounter: Payer: Self-pay | Admitting: Anesthesiology

## 2017-05-22 ENCOUNTER — Telehealth: Payer: Self-pay | Admitting: *Deleted

## 2017-05-22 ENCOUNTER — Inpatient Hospital Stay: Admission: RE | Admit: 2017-05-22 | Payer: Self-pay | Source: Ambulatory Visit

## 2017-06-03 ENCOUNTER — Other Ambulatory Visit: Payer: Self-pay | Admitting: Psychiatry

## 2017-10-18 ENCOUNTER — Other Ambulatory Visit: Payer: Self-pay | Admitting: Psychiatry

## 2019-09-20 ENCOUNTER — Ambulatory Visit: Payer: Self-pay | Admitting: *Deleted

## 2019-09-20 NOTE — Telephone Encounter (Signed)
Pt called with complaints of left eye swelling( and redness x 2 days; the area is near his eye brow and between his nose; he says that it feels "like it's aggravated"; the pt says for the past few days he has been having congestion in his head, and he has dental issues; recommendations made per nurse triage protocol; he verbalized understanding; the pt was seen by Kathrine Haddock, [facility]; he can be contacted at [phone]; will route to office for scheduling.  Reason for Disposition . [1] MILD eyelid swelling (puffiness) AND [2] sinus pain or pressure  Answer Assessment - Initial Assessment Questions 1. ONSET: "When did the swelling start?" (e.g., minutes, hours, days)     09/17/2019 2. LOCATION: "What part of the eyelids is swollen?"    Near eye brow and nose 3. SEVERITY: "How swollen is it?"    mild 4. ITCHING: "Is there any itching?" If so, ask: "How much?"   (Scale 1-10; mild, moderate or severe)     no 5. PAIN: "Is the swelling painful to touch?" If so, ask: "How painful is it?"   (Scale 1-10; mild, moderate or severe)   no 6. FEVER: "Do you have a fever?" If so, ask: "What is it, how was it measured, and when did it start?"     no 7. CAUSE: "What do you think is causing the swelling?"   Not sure 8. RECURRENT SYMPTOM: "Have you had eyelid swelling before?" If so, ask: "When was the last time?" "What happened that time?"     no 9. OTHER SYMPTOMS: "Do you have any other symptoms?" (e.g., blurred vision, eye discharge, rash, runny nose)     no 10. PREGNANCY: "Is there any chance you are pregnant?" "When was your last menstrual period?"      n/a  Protocols used: EYE - [facility]

## 2019-09-20 NOTE — Telephone Encounter (Signed)
Scheduled np appt

## 2019-09-20 NOTE — Telephone Encounter (Signed)
Contacted pt; he was given information per [facility]; the pt states he would like to reestablish with that practice; attempted to contact [facility] x 3 without success; will route to office for final disposition.

## 2019-10-20 ENCOUNTER — Encounter: Payer: Self-pay | Admitting: Nurse Practitioner

## 2019-10-22 ENCOUNTER — Other Ambulatory Visit: Payer: Self-pay

## 2019-10-22 ENCOUNTER — Encounter: Payer: Self-pay | Admitting: Nurse Practitioner

## 2019-10-22 ENCOUNTER — Ambulatory Visit (INDEPENDENT_AMBULATORY_CARE_PROVIDER_SITE_OTHER): Payer: Medicare HMO | Admitting: Nurse Practitioner

## 2019-10-22 DIAGNOSIS — Z7689 Persons encountering health services in other specified circumstances: Secondary | ICD-10-CM

## 2019-10-22 DIAGNOSIS — F332 Major depressive disorder, recurrent severe without psychotic features: Secondary | ICD-10-CM

## 2019-10-22 DIAGNOSIS — Z0000 Encounter for general adult medical examination without abnormal findings: Secondary | ICD-10-CM

## 2019-10-22 DIAGNOSIS — F422 Mixed obsessional thoughts and acts: Secondary | ICD-10-CM | POA: Diagnosis not present

## 2019-10-22 NOTE — Assessment & Plan Note (Signed)
No current medications or psychiatry care.  Denies concerns or need for medication initiation.   

## 2019-10-22 NOTE — Progress Notes (Signed)
New Patient Office Visit  Subjective:  Patient ID: Dillon [name], male    DOB: 1951/04/08  Age: 69 y.o. MRN: [id number]  CC:  Chief Complaint  Patient presents with  . Establish Care    no concerns    . This visit was completed via telephone due to the restrictions of the COVID-19 pandemic. All issues as above were discussed and addressed but no physical exam was performed. If it was felt that the patient should be evaluated in the office, they were directed there. The patient verbally consented to this visit. Patient was unable to complete an audio/visual visit due to Lack of equipment. Due to the catastrophic nature of the COVID-19 pandemic, this visit was done through audio contact only. . Location of the patient: home . Location of the provider: work . Those involved with this call:  . Provider: Aura Dials, DNP . CMA: Wilhemena Durie, CMA . Front Desk/Registration: Harriet Pho  . Time spent on call: 30 minutes on the phone discussing health concerns. 10 minutes total spent in review of patient's record and preparation of their chart.  . I verified patient identity using two factors (patient name and date of birth). Patient consents verbally to being seen via telemedicine visit today.    HPI Dillon [name] presents for new patient visit to establish care.  Introduced to Publishing rights manager role and practice setting.  All questions answered.  Last saw provider at [facility] 3 years ago, does not go to doctor unless has to.  Has not had physical in "maybe 4-5 years".  He does not want to come into office for physical, does not want to come unless he has too.  Reports, "I am doing fine and I am not trying to be stubborn or butt heads, but I do not like coming in unless I need to".  Had at length discussion with patient about team approach and benefit of preventative care visits.  DEPRESSION Had period of OCD years ago when he lost his wife, did go to hospital during that  time period.  This was back in June 2018.  Does not know when he pulled out of it, but he "is a whole lot better now" and has not taken medication in over a year.  Denies SI/HI.   Mood status: controlled Satisfied with current treatment?: yes Symptom severity: none  Duration of current treatment : years Side effects: no Psychotherapy/counseling: none, [facility] Previous psychiatric medications: Luvox and Olanzapine Depressed mood: no Anxious mood: no Anhedonia: no Significant weight loss or gain: no Insomnia: no none Fatigue: no Feelings of worthlessness or guilt: no Impaired concentration/indecisiveness: no Suicidal ideations: no Hopelessness: no Crying spells: no Depression screen [facility] 2/9 10/22/2019 02/03/2016 12/25/2015 12/15/2015 12/01/2015  Decreased Interest 0 0 1 3 3   Down, Depressed, Hopeless 0 1 1 3 3   PHQ - 2 Score 0 1 2 6 6   Altered sleeping - - 3 - 3  Tired, decreased energy - - 3 3 3   Change in appetite - - 1 3 3   Feeling bad or failure about yourself  - - 1 3 3   Trouble concentrating - - 0 - 2  Moving slowly or fidgety/restless - - 1 3 3   Suicidal thoughts - - 0 (No Data) 0  PHQ-9 Score - - 11 - 23  Difficult doing work/chores - - Not difficult at all - -    Past Medical History:  Diagnosis Date  . Kidney stone   .  Meningitis    spinal  . OCD (obsessive compulsive disorder)   . Shingles     Past Surgical History:  Procedure Laterality Date  . KIDNEY STONE SURGERY      Family History  Problem Relation Age of Onset  . Cancer Mother        bladder  . Emphysema Father   . Emphysema Sister   . COPD Sister   . Cancer Brother        prostate    Social History   Socioeconomic History  . Marital status: Widowed    Spouse name: Not on file  . Number of children: Not on file  . Years of education: Not on file  . Highest education level: Not on file  Occupational History  . Not on file  Tobacco Use  . Smoking status: Never Smoker  .  Smokeless tobacco: Never Used  Substance and Sexual Activity  . Alcohol use: No    Alcohol/week: 0.0 standard drinks  . Drug use: No  . Sexual activity: Not Currently  Other Topics Concern  . Not on file  Social History Narrative  . Not on file   Social Determinants of Health   Financial Resource Strain:   . Difficulty of Paying Living Expenses: Not on file  Food Insecurity:   . Worried About Programme researcher, broadcasting/film/video in the Last Year: Not on file  . Ran Out of Food in the Last Year: Not on file  Transportation Needs:   . Lack of Transportation (Medical): Not on file  . Lack of Transportation (Non-Medical): Not on file  Physical Activity:   . Days of Exercise per Week: Not on file  . Minutes of Exercise per Session: Not on file  Stress:   . Feeling of Stress : Not on file  Social Connections:   . Frequency of Communication with Friends and Family: Not on file  . Frequency of Social Gatherings with Friends and Family: Not on file  . Attends Religious Services: Not on file  . Active Member of Clubs or Organizations: Not on file  . Attends Banker Meetings: Not on file  . Marital Status: Not on file  Intimate Partner Violence:   . Fear of Current or Ex-Partner: Not on file  . Emotionally Abused: Not on file  . Physically Abused: Not on file  . Sexually Abused: Not on file    ROS Review of Systems  Constitutional: Negative for activity change, diaphoresis, fatigue and fever.  Respiratory: Negative for cough, chest tightness, shortness of breath and wheezing.   Cardiovascular: Negative for chest pain, palpitations and leg swelling.  Gastrointestinal: Negative for abdominal distention, abdominal pain, constipation, diarrhea, nausea and vomiting.  Neurological: Negative for dizziness, syncope, weakness, light-headedness, numbness and headaches.  Psychiatric/Behavioral: Negative for decreased concentration, self-injury, sleep disturbance and suicidal ideas. The patient  is not nervous/anxious.     Objective:   Today's Vitals: There were no vitals taken for this visit.  Physical Exam   Unable to perform due to telephone visit only, no visual access by patient.  Assessment & Plan:   Problem List Items Addressed This Visit      Other   Severe recurrent major depression without psychotic features (HCC)    Chronic, improved with no medication in over a year.  Denies SI/HI.  Continue to monitor and initiate medication as needed.  Previously on Luvox and Olanzapine.  Does not wish to return to office unless acute issue, refuses physical.  OCD (obsessive compulsive disorder)    No current medications or psychiatry care.  Denies concerns or need for medication initiation.        Preventative health care    Refuses physical or preventative health care, including labs at this time.  Recommend patient schedule appointment for 6 months for follow-up visit and possible labs at that time if he agrees.  Had at length discussion on team approach, with patient being most important member of team and benefit of labs + preventative care.       Other Visit Diagnoses    Encounter to establish care    -  Primary      Outpatient Encounter Medications as of 10/22/2019  Medication Sig  . [DISCONTINUED] fluvoxaMINE (LUVOX) 100 MG tablet TAKE TWO TABLETS BY MOUTH AT BEDTIME  . [DISCONTINUED] OLANZapine (ZYPREXA) 5 MG tablet TAKE ONE TABLET BY MOUTH AT BEDTIME  . [DISCONTINUED] tamsulosin (FLOMAX) 0.4 MG CAPS capsule Take 1 capsule (0.4 mg total) by mouth 2 (two) times daily before a meal.   No facility-administered encounter medications on file as of 10/22/2019.   I discussed the assessment and treatment plan with the patient. The patient was provided an opportunity to ask questions and all were answered. The patient agreed with the plan and demonstrated an understanding of the instructions.   The patient was advised to call back or seek an in-person evaluation if  the symptoms worsen or if the condition fails to improve as anticipated.   I provided 30 minutes of time during this encounter.  Follow-up: Return in about 6 months (around 04/20/2020) for Follow-up.   Venita Lick, NP

## 2019-10-22 NOTE — Assessment & Plan Note (Signed)
Chronic, improved with no medication in over a year.  Denies SI/HI.  Continue to monitor and initiate medication as needed.  Previously on Luvox and Olanzapine.  Does not wish to return to office unless acute issue, refuses physical. 

## 2019-10-22 NOTE — Patient Instructions (Signed)
Healthy Eating Following a healthy eating pattern may help you to achieve and maintain a healthy body weight, reduce the risk of chronic disease, and live a long and productive life. It is important to follow a healthy eating pattern at an appropriate calorie level for your body. Your nutritional needs should be met primarily through food by choosing a variety of nutrient-rich foods. What are tips for following this plan? Reading food labels  Read labels and choose the following: ? Reduced or low sodium. ? Juices with 100% fruit juice. ? Foods with low saturated fats and high polyunsaturated and monounsaturated fats. ? Foods with whole grains, such as whole wheat, cracked wheat, brown rice, and wild rice. ? Whole grains that are fortified with folic acid. This is recommended for women who are pregnant or who want to become pregnant.  Read labels and avoid the following: ? Foods with a lot of added sugars. These include foods that contain brown sugar, corn sweetener, corn syrup, dextrose, fructose, glucose, high-fructose corn syrup, honey, invert sugar, lactose, malt syrup, maltose, molasses, raw sugar, sucrose, trehalose, or turbinado sugar.  Do not eat more than the following amounts of added sugar per day:  6 teaspoons (25 g) for women.  9 teaspoons (38 g) for men. ? Foods that contain processed or refined starches and grains. ? Refined grain products, such as white flour, degermed cornmeal, white bread, and white rice. Shopping  Choose nutrient-rich snacks, such as vegetables, whole fruits, and nuts. Avoid high-calorie and high-sugar snacks, such as potato chips, fruit snacks, and candy.  Use oil-based dressings and spreads on foods instead of solid fats such as butter, stick margarine, or cream cheese.  Limit pre-made sauces, mixes, and "instant" products such as flavored rice, instant noodles, and ready-made pasta.  Try more plant-protein sources, such as tofu, tempeh, black beans,  edamame, lentils, nuts, and seeds.  Explore eating plans such as the Mediterranean diet or vegetarian diet. Cooking  Use oil to saut or stir-fry foods instead of solid fats such as butter, stick margarine, or lard.  Try baking, boiling, grilling, or broiling instead of frying.  Remove the fatty part of meats before cooking.  Steam vegetables in water or broth. Meal planning   At meals, imagine dividing your plate into fourths: ? One-half of your plate is fruits and vegetables. ? One-fourth of your plate is whole grains. ? One-fourth of your plate is protein, especially lean meats, poultry, eggs, tofu, beans, or nuts.  Include low-fat dairy as part of your daily diet. Lifestyle  Choose healthy options in all settings, including home, work, school, restaurants, or stores.  Prepare your food safely: ? Wash your hands after handling raw meats. ? Keep food preparation surfaces clean by regularly washing with hot, soapy water. ? Keep raw meats separate from ready-to-eat foods, such as fruits and vegetables. ? Cook seafood, meat, poultry, and eggs to the recommended internal temperature. ? Store foods at safe temperatures. In general:  Keep cold foods at 59F (4.4C) or below.  Keep hot foods at 159F (60C) or above.  Keep your freezer at [facility] (-17.8C) or below.  Foods are no longer safe to eat when they have been between the temperatures of 40-159F (4.4-60C) for more than 2 hours. What foods should I eat? Fruits Aim to eat 2 cup-equivalents of fresh, canned (in natural juice), or frozen fruits each day. Examples of 1 cup-equivalent of fruit include 1 small apple, 8 large strawberries, 1 cup canned fruit,  cup  dried fruit, or 1 cup 100% juice. Vegetables Aim to eat 2-3 cup-equivalents of fresh and frozen vegetables each day, including different varieties and colors. Examples of 1 cup-equivalent of vegetables include 2 medium carrots, 2 cups raw, leafy greens, 1 cup chopped  vegetable (raw or cooked), or 1 medium baked potato. Grains Aim to eat 6 ounce-equivalents of whole grains each day. Examples of 1 ounce-equivalent of grains include 1 slice of bread, 1 cup ready-to-eat cereal, 3 cups popcorn, or  cup cooked rice, pasta, or cereal. Meats and other proteins Aim to eat 5-6 ounce-equivalents of protein each day. Examples of 1 ounce-equivalent of protein include 1 egg, 1/2 cup nuts or seeds, or 1 tablespoon (16 g) peanut butter. A cut of meat or fish that is the size of a deck of cards is about 3-4 ounce-equivalents.  Of the protein you eat each week, try to have at least 8 ounces come from seafood. This includes salmon, trout, herring, and anchovies. Dairy Aim to eat 3 cup-equivalents of fat-free or low-fat dairy each day. Examples of 1 cup-equivalent of dairy include 1 cup (240 mL) milk, 8 ounces (250 g) yogurt, 1 ounces (44 g) natural cheese, or 1 cup (240 mL) fortified soy milk. Fats and oils  Aim for about 5 teaspoons (21 g) per day. Choose monounsaturated fats, such as canola and olive oils, avocados, peanut butter, and most nuts, or polyunsaturated fats, such as sunflower, corn, and soybean oils, walnuts, pine nuts, sesame seeds, sunflower seeds, and flaxseed. Beverages  Aim for six 8-oz glasses of water per day. Limit coffee to three to five 8-oz cups per day.  Limit caffeinated beverages that have added calories, such as soda and energy drinks.  Limit alcohol intake to no more than 1 drink a day for nonpregnant women and 2 drinks a day for men. One drink equals 12 oz of beer (355 mL), 5 oz of wine (148 mL), or 1 oz of hard liquor (44 mL). Seasoning and other foods  Avoid adding excess amounts of salt to your foods. Try flavoring foods with herbs and spices instead of salt.  Avoid adding sugar to foods.  Try using oil-based dressings, sauces, and spreads instead of solid fats. This information is based on general [place] nutrition guidelines. For more  information, visit [employer]. Exact amounts may vary based on your nutrition needs. Summary  A healthy eating plan may help you to maintain a healthy weight, reduce the risk of chronic diseases, and stay active throughout your life.  Plan your meals. Make sure you eat the right portions of a variety of nutrient-rich foods.  Try baking, boiling, grilling, or broiling instead of frying.  Choose healthy options in all settings, including home, work, school, restaurants, or stores. This information is not intended to replace advice given to you by your health care provider. Make sure you discuss any questions you have with your health care provider. Document Revised: 01/15/2018 Document Reviewed: 01/15/2018 Elsevier Patient Education  [place].

## 2019-10-22 NOTE — Assessment & Plan Note (Signed)
Refuses physical or preventative health care, including labs at this time.  Recommend patient schedule appointment for 6 months for follow-up visit and possible labs at that time if he agrees.  Had at length discussion on team approach, with patient being most important member of team and benefit of labs + preventative care.

## 2020-01-01 ENCOUNTER — Ambulatory Visit: Payer: Medicare HMO

## 2020-01-23 ENCOUNTER — Ambulatory Visit (INDEPENDENT_AMBULATORY_CARE_PROVIDER_SITE_OTHER): Payer: Medicare HMO

## 2020-01-23 DIAGNOSIS — Z0000 Encounter for general adult medical examination without abnormal findings: Secondary | ICD-10-CM | POA: Diagnosis not present

## 2020-01-23 NOTE — Progress Notes (Signed)
Subjective:   Dillon [name] is a 69 y.o. male who presents for an Initial Medicare Annual Wellness Visit.  This visit is being conducted via phone call  - after an attmept to do on video chat - due to the COVID-19 pandemic. This patient has given me verbal consent via phone to conduct this visit, patient states they are participating from their home address. Some vital signs may be absent or patient reported.   Patient identification: identified by name, DOB, and current address.    Review of Systems   Cardiac Risk Factors include: advanced age (>66men, >9 women);hypertension;dyslipidemia;male gender    Objective:    There were no vitals filed for this visit. There is no height or weight on file to calculate BMI.  Advanced Directives 01/23/2020 01/04/2016 12/15/2015  Does Patient Have a Medical Advance Directive? No No No  Would patient like information on creating a medical advance directive? - - No - patient declined information  Some encounter information is confidential and restricted. Go to Review Flowsheets activity to see all data.    Current Medications (verified) No outpatient encounter medications on file as of 01/23/2020.   No facility-administered encounter medications on file as of 01/23/2020.    Allergies (verified) Codeine   History: Past Medical History:  Diagnosis Date  . Kidney stone   . Meningitis    spinal  . OCD (obsessive compulsive disorder)   . Shingles    Past Surgical History:  Procedure Laterality Date  . KIDNEY STONE SURGERY     Family History  Problem Relation Age of Onset  . Cancer Mother        bladder  . Emphysema Father   . Emphysema Sister   . COPD Sister   . Cancer Brother        prostate   Social History   Socioeconomic History  . Marital status: Widowed    Spouse name: Not on file  . Number of children: Not on file  . Years of education: Not on file  . Highest education level: Not on file  Occupational History  .  Not on file  Tobacco Use  . Smoking status: Never Smoker  . Smokeless tobacco: Never Used  Substance and Sexual Activity  . Alcohol use: No    Alcohol/week: 0.0 standard drinks  . Drug use: No  . Sexual activity: Not Currently  Other Topics Concern  . Not on file  Social History Narrative  . Not on file   Social Determinants of Health   Financial Resource Strain:   . Difficulty of Paying Living Expenses:   Food Insecurity:   . Worried About Programme researcher, broadcasting/film/video in the Last Year:   . Barista in the Last Year:   Transportation Needs:   . Freight forwarder (Medical):   Marland Kitchen Lack of Transportation (Non-Medical):   Physical Activity:   . Days of Exercise per Week:   . Minutes of Exercise per Session:   Stress:   . Feeling of Stress :   Social Connections:   . Frequency of Communication with Friends and Family:   . Frequency of Social Gatherings with Friends and Family:   . Attends Religious Services:   . Active Member of Clubs or Organizations:   . Attends Banker Meetings:   Marland Kitchen Marital Status:    Tobacco Counseling Counseling given: Not Answered   Clinical Intake:  Pre-visit preparation completed: Yes  Pain : No/denies pain  Nutritional Risks: None Diabetes: No  How often do you need to have someone help you when you read instructions, pamphlets, or other written materials from your doctor or pharmacy?: 1 - Never  Interpreter Needed?: No  Information entered by :: Keelee Yankey,LPN  Activities of Daily Living In your present state of health, do you have any difficulty performing the following activities: 01/23/2020 10/22/2019  Hearing? Y N  Comment no hearing aids, tinnitus -  Vision? N N  Comment reading glasses -  Difficulty concentrating or making decisions? N N  Walking or climbing stairs? N N  Dressing or bathing? N N  Doing errands, shopping? N N  Preparing Food and eating ? N -  Using the Toilet? N -  In the past six months,  have you accidently leaked urine? N -  Do you have problems with loss of bowel control? N -  Managing your Medications? N -  Managing your Finances? N -  Housekeeping or managing your Housekeeping? N -  Some recent data might be hidden     Immunizations and Health Maintenance  There is no immunization history on file for this patient. Health Maintenance Due  Topic Date Due  . PNA vac Low Risk Adult (1 of 2 - PCV13) Never done    Patient Care Team: Marjie Skiff, NP as PCP - General (Nurse Practitioner)  Indicate any recent Medical Services you may have received from other than [facility] providers in the past year (date may be approximate).    Assessment:   This is a routine wellness examination for Dillon [name].  Hearing/Vision screen  Hearing Screening   125Hz  250Hz  500Hz  1000Hz  2000Hz  3000Hz  4000Hz  6000Hz  8000Hz   Right ear:           Left ear:           Vision Screening Comments: No eye dr   Dietary issues and exercise activities discussed: Current Exercise Habits: The patient has a physically strenuous job, but has no regular exercise apart from work., Exercise limited by: None identified  Goals Addressed   None    Depression Screen PHQ 2/9 Scores 01/23/2020 10/22/2019 02/03/2016 12/25/2015  PHQ - 2 Score 0 0 1 2  PHQ- 9 Score - - - 11    Fall Risk Fall Risk  01/23/2020 10/22/2019  Falls in the past year? 0 0  Number falls in past yr: 0 0  Injury with Fall? 0 0  Follow up - Falls evaluation completed    FALL RISK PREVENTION PERTAINING TO THE HOME:  Any stairs in or around the home? Yes  few steps at front porch  If so, are there any without handrails? No   Home free of loose throw rugs in walkways, pet beds, electrical cords, etc? Yes  Adequate lighting in your home to reduce risk of falls? Yes   ASSISTIVE DEVICES UTILIZED TO PREVENT FALLS:  Life alert? No  Use of a cane, walker or w/c? No  Grab bars in the bathroom? No  Shower chair or bench in shower? No  Elevated  toilet seat or a handicapped toilet? No    TIMED UP AND GO:  Unable to perform    Cognitive Function:        Screening Tests Health Maintenance  Topic Date Due  . PNA vac Low Risk Adult (1 of 2 - PCV13) Never done  . COLONOSCOPY  01/22/2021 (Originally 01/02/2003)  . TETANUS/TDAP  01/22/2021 (Originally 01/02/1972)  . INFLUENZA VACCINE  05/17/2020  .  Hepatitis C Screening  Addressed    Qualifies for Shingles Vaccine? Yes  Zostavax completed n/a. Due for Shingrix. Education has been provided regarding the importance of this vaccine. Pt has been advised to call insurance company to determine out of pocket expense. Advised may also receive vaccine at local pharmacy or Health Dept. Verbalized acceptance and understanding.  Tdap: Discussed need for TD/TDAP vaccine, patient verbalized understanding that this is not covered as a preventative with there insurance and to call the office if he develops any new skin injuries, ie: cuts, scrapes, bug bites, or open wounds.  Flu Vaccine: declined   Pneumococcal Vaccine: Due for Pneumococcal vaccine.will think about getting It at next visit.   Covid-19 Vaccine: Information provided  Cancer Screenings:  Colorectal Screening: declined   Lung Cancer Screening: (Low Dose CT Chest recommended if Age 65-80 years, 30 pack-year currently smoking OR have quit w/in 15years.) does not qualify.    Additional Screening:  Hepatitis C Screening: does qualify; Completed 2017  Vision Screening: Recommended annual ophthalmology exams for early detection of glaucoma and other disorders of the eye. Is the patient up to date with their annual eye exam?  no   Dental Screening: Recommended annual dental exams for proper oral hygiene  Community Resource Referral:  CRR required this visit?  No        Plan:  I have personally reviewed and addressed the Medicare Annual Wellness questionnaire and have noted the following in the patient's chart:   A. Medical and social history B. Use of alcohol, tobacco or illicit drugs  C. Current medications and supplements D. Functional ability and status E.  Nutritional status F.  Physical activity G. Advance directives H. List of other physicians I.  Hospitalizations, surgeries, and ER visits in previous 12 months J.  [place] such as hearing and vision if needed, cognitive and depression L. Referrals and appointments   In addition, I have reviewed and discussed with patient certain preventive protocols, quality metrics, and best practice recommendations. A written personalized care plan for preventive services as well as general preventive health recommendations were provided to patient.   Signed,    Bevelyn Ngo, LPN   0/10/270  Nurse Health Advisor   Nurse Notes: none

## 2020-01-23 NOTE — Patient Instructions (Signed)
[name] , Thank you for taking time to come for your Medicare Wellness Visit. I appreciate your ongoing commitment to your health goals. Please review the following plan we discussed and let me know if I can assist you in the future.   Screening recommendations/referrals: Colonoscopy:declined Recommended yearly ophthalmology/optometry visit for glaucoma screening and checkup Recommended yearly dental visit for hygiene and checkup  Vaccinations: Influenza vaccine: due 06/2019 Pneumococcal vaccine: due now  Tdap vaccine: due now  Shingles vaccine: shingrix eligible    Covid-19: We are recommending the vaccine to everyone who has not had an allergic reaction to any of the components of the vaccine. If you have specific questions about the vaccine, please bring them up with your health care provider to discuss them.   We will likely not be getting the vaccine in the office for the first rounds of vaccinations. The way they are releasing the vaccines is going to be through the health systems (like [facility], [facility], [facility], [facility]), through your county health department, or through the pharmacies.   The [facility] Department is giving vaccines to those 65+ and Health Care Workers Teachers and Child Care providers start 12/11/19, Essential workers start 3/10 and those with co-morbidities start 01/08/20 Call [phone] to schedule  If you are 65+ you can get a vaccine through [facility] by signing up for an appointment.  You can sign up by going to: [email or web address].  You can get more information by going to: [email or web address]  [employer] next door is giving the [employer]- you can call [phone] or stop by there to schedule.  Advanced directives: Advance directive discussed with you today. Once this is complete please bring a copy in to our office so we can scan it into your chart.  Conditions/risks identified: none   Next appointment: Follow up  in one year for your annual wellness visit.   Preventive Care 18 Years and Older, Male Preventive care refers to lifestyle choices and visits with your health care provider that can promote health and wellness. What does preventive care include?  A yearly physical exam. This is also called an annual well check.  Dental exams once or twice a year.  Routine eye exams. Ask your health care provider how often you should have your eyes checked.  Personal lifestyle choices, including:  Daily care of your teeth and gums.  Regular physical activity.  Eating a healthy diet.  Avoiding tobacco and drug use.  Limiting alcohol use.  Practicing safe sex.  Taking low doses of aspirin every day.  Taking vitamin and mineral supplements as recommended by your health care provider. What happens during an annual well check? The services and screenings done by your health care provider during your annual well check will depend on your age, overall health, lifestyle risk factors, and family history of disease. Counseling  Your health care provider may ask you questions about your:  Alcohol use.  Tobacco use.  Drug use.  Emotional well-being.  Home and relationship well-being.  Sexual activity.  Eating habits.  History of falls.  Memory and ability to understand (cognition).  Work and work Astronomer. Screening  You may have the following tests or measurements:  Height, weight, and BMI.  Blood pressure.  Lipid and cholesterol levels. These may be checked every 5 years, or more frequently if you are over 53 years old.  Skin check.  Lung cancer screening. You may have this screening every year starting at age 56 if you  have a 30-pack-year history of smoking and currently smoke or have quit within the past 15 years.  Fecal occult blood test (FOBT) of the stool. You may have this test every year starting at age 46.  Flexible sigmoidoscopy or colonoscopy. You may have a  sigmoidoscopy every 5 years or a colonoscopy every 10 years starting at age 27.  Prostate cancer screening. Recommendations will vary depending on your family history and other risks.  Hepatitis C blood test.  Hepatitis B blood test.  Sexually transmitted disease (STD) testing.  Diabetes screening. This is done by checking your blood sugar (glucose) after you have not eaten for a while (fasting). You may have this done every 1-3 years.  Abdominal aortic aneurysm (AAA) screening. You may need this if you are a current or former smoker.  Osteoporosis. You may be screened starting at age 43 if you are at high risk. Talk with your health care provider about your test results, treatment options, and if necessary, the need for more tests. Vaccines  Your health care provider may recommend certain vaccines, such as:  Influenza vaccine. This is recommended every year.  Tetanus, diphtheria, and acellular pertussis (Tdap, Td) vaccine. You may need a Td booster every 10 years.  Zoster vaccine. You may need this after age 20.  Pneumococcal 13-valent conjugate (PCV13) vaccine. One dose is recommended after age 69.  Pneumococcal polysaccharide (PPSV23) vaccine. One dose is recommended after age 41. Talk to your health care provider about which screenings and vaccines you need and how often you need them. This information is not intended to replace advice given to you by your health care provider. Make sure you discuss any questions you have with your health care provider. Document Released: 10/30/2015 Document Revised: 06/22/2016 Document Reviewed: 08/04/2015 Elsevier Interactive Patient Education  2017 [place] Prevention in the Home Falls can cause injuries. They can happen to people of all ages. There are many things you can do to make your home safe and to help prevent falls. What can I do on the outside of my home?  Regularly fix the edges of walkways and driveways and fix any  cracks.  Remove anything that might make you trip as you walk through a door, such as a raised step or threshold.  Trim any bushes or trees on the path to your home.  Use bright outdoor lighting.  Clear any walking paths of anything that might make someone trip, such as rocks or tools.  Regularly check to see if handrails are loose or broken. Make sure that both sides of any steps have handrails.  Any raised decks and porches should have guardrails on the edges.  Have any leaves, snow, or ice cleared regularly.  Use sand or salt on walking paths during winter.  Clean up any spills in your garage right away. This includes oil or grease spills. What can I do in the bathroom?  Use night lights.  Install grab bars by the toilet and in the tub and shower. Do not use towel bars as grab bars.  Use non-skid mats or decals in the tub or shower.  If you need to sit down in the shower, use a plastic, non-slip stool.  Keep the floor dry. Clean up any water that spills on the floor as soon as it happens.  Remove soap buildup in the tub or shower regularly.  Attach bath mats securely with double-sided non-slip rug tape.  Do not have throw rugs and other  things on the floor that can make you trip. What can I do in the bedroom?  Use night lights.  Make sure that you have a light by your bed that is easy to reach.  Do not use any sheets or blankets that are too big for your bed. They should not hang down onto the floor.  Have a firm chair that has side arms. You can use this for support while you get dressed.  Do not have throw rugs and other things on the floor that can make you trip. What can I do in the kitchen?  Clean up any spills right away.  Avoid walking on wet floors.  Keep items that you use a lot in easy-to-reach places.  If you need to reach something above you, use a strong step stool that has a grab bar.  Keep electrical cords out of the way.  Do not use floor  polish or wax that makes floors slippery. If you must use wax, use non-skid floor wax.  Do not have throw rugs and other things on the floor that can make you trip. What can I do with my stairs?  Do not leave any items on the stairs.  Make sure that there are handrails on both sides of the stairs and use them. Fix handrails that are broken or loose. Make sure that handrails are as long as the stairways.  Check any carpeting to make sure that it is firmly attached to the stairs. Fix any carpet that is loose or worn.  Avoid having throw rugs at the top or bottom of the stairs. If you do have throw rugs, attach them to the floor with carpet tape.  Make sure that you have a light switch at the top of the stairs and the bottom of the stairs. If you do not have them, ask someone to add them for you. What else can I do to help prevent falls?  Wear shoes that:  Do not have high heels.  Have rubber bottoms.  Are comfortable and fit you well.  Are closed at the toe. Do not wear sandals.  If you use a stepladder:  Make sure that it is fully opened. Do not climb a closed stepladder.  Make sure that both sides of the stepladder are locked into place.  Ask someone to hold it for you, if possible.  Clearly mark and make sure that you can see:  Any grab bars or handrails.  First and last steps.  Where the edge of each step is.  Use tools that help you move around (mobility aids) if they are needed. These include:  Canes.  Walkers.  Scooters.  Crutches.  Turn on the lights when you go into a dark area. Replace any light bulbs as soon as they burn out.  Set up your furniture so you have a clear path. Avoid moving your furniture around.  If any of your floors are uneven, fix them.  If there are any pets around you, be aware of where they are.  Review your medicines with your doctor. Some medicines can make you feel dizzy. This can increase your chance of falling. Ask your  doctor what other things that you can do to help prevent falls. This information is not intended to replace advice given to you by your health care provider. Make sure you discuss any questions you have with your health care provider. Document Released: 07/30/2009 Document Revised: 03/10/2016 Document Reviewed: 11/07/2014 Elsevier Interactive Patient  Education  2017 [employer].

## 2020-04-22 ENCOUNTER — Other Ambulatory Visit: Payer: Self-pay

## 2020-04-22 ENCOUNTER — Ambulatory Visit (INDEPENDENT_AMBULATORY_CARE_PROVIDER_SITE_OTHER): Payer: Medicare HMO | Admitting: Nurse Practitioner

## 2020-04-22 ENCOUNTER — Encounter: Payer: Self-pay | Admitting: Nurse Practitioner

## 2020-04-22 VITALS — BP 136/82 | HR 93 | Temp 98.2°F | Ht 68.4 in | Wt 225.8 lb

## 2020-04-22 DIAGNOSIS — R030 Elevated blood-pressure reading, without diagnosis of hypertension: Secondary | ICD-10-CM | POA: Insufficient documentation

## 2020-04-22 DIAGNOSIS — F325 Major depressive disorder, single episode, in full remission: Secondary | ICD-10-CM

## 2020-04-22 DIAGNOSIS — F422 Mixed obsessional thoughts and acts: Secondary | ICD-10-CM

## 2020-04-22 DIAGNOSIS — Z6833 Body mass index (BMI) 33.0-33.9, adult: Secondary | ICD-10-CM | POA: Diagnosis not present

## 2020-04-22 DIAGNOSIS — E6609 Other obesity due to excess calories: Secondary | ICD-10-CM | POA: Insufficient documentation

## 2020-04-22 DIAGNOSIS — E669 Obesity, unspecified: Secondary | ICD-10-CM | POA: Insufficient documentation

## 2020-04-22 DIAGNOSIS — Z0000 Encounter for general adult medical examination without abnormal findings: Secondary | ICD-10-CM

## 2020-04-22 NOTE — Assessment & Plan Note (Signed)
Recommended eating smaller high protein, low fat meals more frequently and exercising 30 mins a day 5 times a week with a goal of 10-15lb weight loss in the next 3 months. Patient voiced their understanding and motivation to adhere to these recommendations.  

## 2020-04-22 NOTE — Patient Instructions (Signed)
DASH Eating Plan DASH stands for "Dietary Approaches to Stop Hypertension." The DASH eating plan is a healthy eating plan that has been shown to reduce high blood pressure (hypertension). It may also reduce your risk for type 2 diabetes, heart disease, and stroke. The DASH eating plan may also help with weight loss. What are tips for following this plan?  General guidelines  Avoid eating more than 2,300 mg (milligrams) of salt (sodium) a day. If you have hypertension, you may need to reduce your sodium intake to 1,500 mg a day.  Limit alcohol intake to no more than 1 drink a day for nonpregnant women and 2 drinks a day for men. One drink equals 12 oz of beer, 5 oz of wine, or 1 oz of hard liquor.  Work with your health care provider to maintain a healthy body weight or to lose weight. Ask what an ideal weight is for you.  Get at least 30 minutes of exercise that causes your heart to beat faster (aerobic exercise) most days of the week. Activities may include walking, swimming, or biking.  Work with your health care provider or diet and nutrition specialist (dietitian) to adjust your eating plan to your individual calorie needs. Reading food labels   Check food labels for the amount of sodium per serving. Choose foods with less than 5 percent of the Daily Value of sodium. Generally, foods with less than 300 mg of sodium per serving fit into this eating plan.  To find whole grains, look for the word "whole" as the first word in the ingredient list. Shopping  Buy products labeled as "low-sodium" or "no salt added."  Buy fresh foods. Avoid canned foods and premade or frozen meals. Cooking  Avoid adding salt when cooking. Use salt-free seasonings or herbs instead of table salt or sea salt. Check with your health care provider or pharmacist before using salt substitutes.  Do not fry foods. Cook foods using healthy methods such as baking, boiling, grilling, and broiling instead.  Cook with  heart-healthy oils, such as olive, canola, soybean, or sunflower oil. Meal planning  Eat a balanced diet that includes: ? 5 or more servings of fruits and vegetables each day. At each meal, try to fill half of your plate with fruits and vegetables. ? Up to 6-8 servings of whole grains each day. ? Less than 6 oz of lean meat, poultry, or fish each day. A 3-oz serving of meat is about the same size as a deck of cards. One egg equals 1 oz. ? 2 servings of low-fat dairy each day. ? A serving of nuts, seeds, or beans 5 times each week. ? Heart-healthy fats. Healthy fats called Omega-3 fatty acids are found in foods such as flaxseeds and coldwater fish, like sardines, salmon, and mackerel.  Limit how much you eat of the following: ? Canned or prepackaged foods. ? Food that is high in trans fat, such as fried foods. ? Food that is high in saturated fat, such as fatty meat. ? Sweets, desserts, sugary drinks, and other foods with added sugar. ? Full-fat dairy products.  Do not salt foods before eating.  Try to eat at least 2 vegetarian meals each week.  Eat more home-cooked food and less restaurant, buffet, and fast food.  When eating at a restaurant, ask that your food be prepared with less salt or no salt, if possible. What foods are recommended? The items listed may not be a complete list. Talk with your dietitian about   what dietary choices are best for you. Grains Whole-grain or whole-wheat bread. Whole-grain or whole-wheat pasta. Brown rice. Oatmeal. Quinoa. Bulgur. Whole-grain and low-sodium cereals. Pita bread. Low-fat, low-sodium crackers. Whole-wheat flour tortillas. Vegetables Fresh or frozen vegetables (raw, steamed, roasted, or grilled). Low-sodium or reduced-sodium tomato and vegetable juice. Low-sodium or reduced-sodium tomato sauce and tomato paste. Low-sodium or reduced-sodium canned vegetables. Fruits All fresh, dried, or frozen fruit. Canned fruit in natural juice (without  added sugar). Meat and other protein foods Skinless chicken or turkey. Ground chicken or turkey. Pork with fat trimmed off. Fish and seafood. Egg whites. Dried beans, peas, or lentils. Unsalted nuts, nut butters, and seeds. Unsalted canned beans. Lean cuts of beef with fat trimmed off. Low-sodium, lean deli meat. Dairy Low-fat (1%) or fat-free (skim) milk. Fat-free, low-fat, or reduced-fat cheeses. Nonfat, low-sodium ricotta or cottage cheese. Low-fat or nonfat yogurt. Low-fat, low-sodium cheese. Fats and oils Soft margarine without trans fats. Vegetable oil. Low-fat, reduced-fat, or light mayonnaise and salad dressings (reduced-sodium). Canola, safflower, olive, soybean, and sunflower oils. Avocado. Seasoning and other foods Herbs. Spices. Seasoning mixes without salt. Unsalted popcorn and pretzels. Fat-free sweets. What foods are not recommended? The items listed may not be a complete list. Talk with your dietitian about what dietary choices are best for you. Grains Baked goods made with fat, such as croissants, muffins, or some breads. Dry pasta or rice meal packs. Vegetables Creamed or fried vegetables. Vegetables in a cheese sauce. Regular canned vegetables (not low-sodium or reduced-sodium). Regular canned tomato sauce and paste (not low-sodium or reduced-sodium). Regular tomato and vegetable juice (not low-sodium or reduced-sodium). Pickles. Olives. Fruits Canned fruit in a light or heavy syrup. Fried fruit. Fruit in cream or butter sauce. Meat and other protein foods Fatty cuts of meat. Ribs. Fried meat. Bacon. Sausage. Bologna and other processed lunch meats. Salami. Fatback. Hotdogs. Bratwurst. Salted nuts and seeds. Canned beans with added salt. Canned or smoked fish. Whole eggs or egg yolks. Chicken or turkey with skin. Dairy Whole or 2% milk, cream, and half-and-half. Whole or full-fat cream cheese. Whole-fat or sweetened yogurt. Full-fat cheese. Nondairy creamers. Whipped toppings.  Processed cheese and cheese spreads. Fats and oils Butter. Stick margarine. Lard. Shortening. Ghee. Bacon fat. Tropical oils, such as coconut, palm kernel, or palm oil. Seasoning and other foods Salted popcorn and pretzels. Onion salt, garlic salt, seasoned salt, table salt, and sea salt. Worcestershire sauce. Tartar sauce. Barbecue sauce. Teriyaki sauce. Soy sauce, including reduced-sodium. Steak sauce. Canned and packaged gravies. Fish sauce. Oyster sauce. Cocktail sauce. Horseradish that you find on the shelf. Ketchup. Mustard. Meat flavorings and tenderizers. Bouillon cubes. Hot sauce and Tabasco sauce. Premade or packaged marinades. Premade or packaged taco seasonings. Relishes. Regular salad dressings. Where to find more information:  National Heart, Lung, and Blood Institute: www.nhlbi.nih.gov  American Heart Association: www.heart.org Summary  The DASH eating plan is a healthy eating plan that has been shown to reduce high blood pressure (hypertension). It may also reduce your risk for type 2 diabetes, heart disease, and stroke.  With the DASH eating plan, you should limit salt (sodium) intake to 2,300 mg a day. If you have hypertension, you may need to reduce your sodium intake to 1,500 mg a day.  When on the DASH eating plan, aim to eat more fresh fruits and vegetables, whole grains, lean proteins, low-fat dairy, and heart-healthy fats.  Work with your health care provider or diet and nutrition specialist (dietitian) to adjust your eating plan to your   individual calorie needs. This information is not intended to replace advice given to you by your health care provider. Make sure you discuss any questions you have with your health care provider. Document Revised: 09/15/2017 Document Reviewed: 09/26/2016 Elsevier Patient Education  2020 [employer].  

## 2020-04-22 NOTE — Assessment & Plan Note (Signed)
Chronic, improved with no medication in over a year.  Denies SI/HI.  Continue to monitor and initiate medication as needed.  Previously on Luvox and Olanzapine.  Does not wish to return to office unless acute issue, refuses physical.

## 2020-04-22 NOTE — Progress Notes (Signed)
BP 136/82 (BP Location: Left Arm)   Pulse 93   Temp 98.2 F (36.8 C) (Oral)   Ht 5' 8.4" (1.737 m)   Wt 225 lb 12.8 oz (102.4 kg)   SpO2 98%   BMI 33.93 kg/m    Subjective:    Patient ID: Dillon [name], male    DOB: Mar 16, 1951, 69 y.o.   MRN: [id number]  HPI: Dillon [name] is a 69 y.o. male  Chief Complaint  Patient presents with  . Follow-up   He does not like coming to office for physicals, does not want to come unless he has too.  Reported last visit,  "I am doing fine and I am not trying to be stubborn or butt heads, but I do not like coming in unless I need to".  Had at length discussion with patient about team approach and benefit of preventative care visits.  He continues to refuse labs or physical -- but agrees to ponder this.  DEPRESSION Had period of OCD years ago when he lost his wife, did go to hospital during that time period.  This was back in June 2018.  Is "a lot better these days". Denies SI/HI.   Mood status: controlled Symptom severity: none  Duration of current treatment : years Side effects: no Psychotherapy/counseling: none, [facility] Previous psychiatric medications: Luvox and Olanzapine Depressed mood: no Anxious mood: no Anhedonia: no Significant weight loss or gain: no Insomnia: no none Fatigue: no Feelings of worthlessness or guilt: no Impaired concentration/indecisiveness: no Suicidal ideations: no Hopelessness: no Crying spells: no Depression screen [facility] 2/9 04/22/2020 04/22/2020 01/23/2020 10/22/2019 02/03/2016  Decreased Interest 0 0 0 0 0  Down, Depressed, Hopeless 0 0 0 0 1  PHQ - 2 Score 0 0 0 0 1  Altered sleeping 0 1 - - -  Tired, decreased energy 0 1 - - -  Change in appetite 0 0 - - -  Feeling bad or failure about yourself  0 0 - - -  Trouble concentrating 0 0 - - -  Moving slowly or fidgety/restless 0 0 - - -  Suicidal thoughts 0 0 - - -  PHQ-9 Score 0 2 - - -  Difficult doing work/chores Not difficult at all Not  difficult at all - - -    Relevant past medical, surgical, family and social history reviewed and updated as indicated. Interim medical history since our last visit reviewed. Allergies and medications reviewed and updated.  Review of Systems  Constitutional: Negative for activity change, diaphoresis, fatigue and fever.  Respiratory: Negative for cough, chest tightness, shortness of breath and wheezing.   Cardiovascular: Negative for chest pain, palpitations and leg swelling.  Gastrointestinal: Negative.   Neurological: Negative.   Psychiatric/Behavioral: Negative for decreased concentration, self-injury, sleep disturbance and suicidal ideas. The patient is not nervous/anxious.     Per HPI unless specifically indicated above     Objective:    BP 136/82 (BP Location: Left Arm)   Pulse 93   Temp 98.2 F (36.8 C) (Oral)   Ht 5' 8.4" (1.737 m)   Wt 225 lb 12.8 oz (102.4 kg)   SpO2 98%   BMI 33.93 kg/m   Wt Readings from Last 3 Encounters:  04/22/20 225 lb 12.8 oz (102.4 kg)  03/22/17 180 lb (81.6 kg)  02/03/16 171 lb 6.4 oz (77.7 kg)    Physical Exam Vitals and nursing note reviewed.  Constitutional:      General: He is  awake. He is not in acute distress.    Appearance: He is well-developed and well-groomed. He is obese. He is not ill-appearing.  HENT:     Head: Normocephalic and atraumatic.     Right Ear: Hearing normal. No drainage.     Left Ear: Hearing normal. No drainage.  Eyes:     General: Lids are normal.        Right eye: No discharge.        Left eye: No discharge.     Conjunctiva/sclera: Conjunctivae normal.     Pupils: Pupils are equal, round, and reactive to light.  Neck:     Thyroid: No thyromegaly.     Vascular: No carotid bruit.     Trachea: Trachea normal.  Cardiovascular:     Rate and Rhythm: Normal rate and regular rhythm.     Heart sounds: Normal heart sounds, S1 normal and S2 normal. No murmur heard.  No gallop.   Pulmonary:     Effort:  Pulmonary effort is normal. No accessory muscle usage or respiratory distress.     Breath sounds: Normal breath sounds.  Abdominal:     General: Bowel sounds are normal.     Palpations: Abdomen is soft.  Musculoskeletal:        General: Normal range of motion.     Cervical back: Normal range of motion and neck supple.     Right lower leg: No edema.     Left lower leg: No edema.  Skin:    General: Skin is warm and dry.     Capillary Refill: Capillary refill takes less than 2 seconds.  Neurological:     Mental Status: He is alert and oriented to person, place, and time.     Deep Tendon Reflexes: Reflexes are normal and symmetric.  Psychiatric:        Attention and Perception: Attention normal.        Mood and Affect: Mood normal.        Speech: Speech normal.        Behavior: Behavior normal. Behavior is cooperative.        Thought Content: Thought content normal.     Results for orders placed or performed during the hospital encounter of 03/22/17  CBC with Differential/Platelet  Result Value Ref Range   WBC 8.9 3.8 - 10.6 K/uL   RBC 4.71 4.40 - 5.90 MIL/uL   Hemoglobin 15.1 13.0 - 18.0 g/dL   HCT 42.6 40 - 52 %   MCV 88.2 80.0 - 100.0 fL   MCH 32.0 26.0 - 34.0 pg   MCHC 36.3 (H) 32.0 - 36.0 g/dL   RDW 83.4 19.6 - 22.2 %   Platelets 162 150 - 440 K/uL   Neutrophils Relative % 56 %   Neutro Abs 5.0 1.4 - 6.5 K/uL   Lymphocytes Relative 33 %   Lymphs Abs 2.9 1.0 - 3.6 K/uL   Monocytes Relative 9 %   Monocytes Absolute 0.8 0 - 1 K/uL   Eosinophils Relative 1 %   Eosinophils Absolute 0.0 0 - 0 K/uL   Basophils Relative 1 %   Basophils Absolute 0.1 0 - 0 K/uL  Comprehensive metabolic panel  Result Value Ref Range   Sodium 141 135 - 145 mmol/L   Potassium 2.7 (LL) 3.5 - 5.1 mmol/L   Chloride 114 (H) 101 - 111 mmol/L   CO2 18 (L) 22 - 32 mmol/L   Glucose, Bld 77 65 - 99 mg/dL  BUN 14 6 - 20 mg/dL   Creatinine, Ser 1.47 0.61 - 1.24 mg/dL   Calcium 8.1 (L) 8.9 - 10.3  mg/dL   Total Protein 5.2 (L) 6.5 - 8.1 g/dL   Albumin 3.2 (L) 3.5 - 5.0 g/dL   AST 34 15 - 41 U/L   ALT 53 17 - 63 U/L   Alkaline Phosphatase 90 38 - 126 U/L   Total Bilirubin 2.5 (H) 0.3 - 1.2 mg/dL   GFR calc non Af Amer >60 >60 mL/min   GFR calc Af Amer >60 >60 mL/min   Anion gap 9 5 - 15  Magnesium  Result Value Ref Range   Magnesium 1.6 (L) 1.7 - 2.4 mg/dL  Lactic acid, plasma  Result Value Ref Range   Lactic Acid, Venous 1.9 0.5 - 1.9 mmol/L  Basic metabolic panel  Result Value Ref Range   Sodium 137 135 - 145 mmol/L   Potassium 3.2 (L) 3.5 - 5.1 mmol/L   Chloride 110 101 - 111 mmol/L   CO2 17 (L) 22 - 32 mmol/L   Glucose, Bld 88 65 - 99 mg/dL   BUN 14 6 - 20 mg/dL   Creatinine, Ser 8.29 0.61 - 1.24 mg/dL   Calcium 8.7 (L) 8.9 - 10.3 mg/dL   GFR calc non Af Amer >60 >60 mL/min   GFR calc Af Amer >60 >60 mL/min   Anion gap 10 5 - 15      Assessment & Plan:   Problem List Items Addressed This Visit      Other   Depression, major, in remission (HCC) - Primary    Chronic, improved with no medication in over a year.  Denies SI/HI.  Continue to monitor and initiate medication as needed.  Previously on Luvox and Olanzapine.  Does not wish to return to office unless acute issue, refuses physical.      OCD (obsessive compulsive disorder)    No current medications or psychiatry care.  Denies concerns or need for medication initiation.        Preventative health care    Refuses physical or preventative health care, including labs at this time.  Had at length discussion on team approach, with patient being most important member of team and benefit of labs + preventative care.      Elevated BP without diagnosis of hypertension    Elevated on initial BP readings, but improved on repeat.  Suspect some white coat hypertension due to patient having anxiety in office setting.  Recommend he monitor BP at home regularly and focus on DASH diet.  He refuses labs today, although  highly recommended.        Obesity    Recommended eating smaller high protein, low fat meals more frequently and exercising 30 mins a day 5 times a week with a goal of 10-15lb weight loss in the next 3 months. Patient voiced their understanding and motivation to adhere to these recommendations.           Follow up plan: Return if symptoms worsen or fail to improve.

## 2020-04-22 NOTE — Assessment & Plan Note (Signed)
Elevated on initial BP readings, but improved on repeat.  Suspect some white coat hypertension due to patient having anxiety in office setting.  Recommend he monitor BP at home regularly and focus on DASH diet.  He refuses labs today, although highly recommended.

## 2020-04-22 NOTE — Assessment & Plan Note (Signed)
No current medications or psychiatry care.  Denies concerns or need for medication initiation.

## 2020-04-22 NOTE — Assessment & Plan Note (Addendum)
Refuses physical or preventative health care, including labs at this time.  Had at length discussion on team approach, with patient being most important member of team and benefit of labs + preventative care.

## 2021-01-27 ENCOUNTER — Ambulatory Visit: Payer: Medicare HMO

## 2021-01-29 ENCOUNTER — Ambulatory Visit: Payer: Medicare HMO

## 2021-02-05 ENCOUNTER — Ambulatory Visit: Payer: Medicare HMO

## 2021-02-08 ENCOUNTER — Ambulatory Visit (INDEPENDENT_AMBULATORY_CARE_PROVIDER_SITE_OTHER): Payer: Medicare HMO

## 2021-02-08 VITALS — Ht 69.0 in | Wt 220.0 lb

## 2021-02-08 DIAGNOSIS — Z0000 Encounter for general adult medical examination without abnormal findings: Secondary | ICD-10-CM | POA: Diagnosis not present

## 2021-02-08 NOTE — Progress Notes (Signed)
I connected with [name] today by telephone and verified that I am speaking with the correct person using two identifiers. Location patient: home Location provider: work Persons participating in the virtual visit: Gwenlyn Saranobert Tan, Jeanell Mangan LPN.   I discussed the limitations, risks, security and privacy concerns of performing an evaluation and management service by telephone and the availability of in person appointments. I also discussed with the patient that there may be a patient responsible charge related to this service. The patient expressed understanding and verbally consented to this telephonic visit.    Interactive audio and video telecommunications were attempted between this provider and patient, however failed, due to patient having technical difficulties OR patient did not have access to video capability.  We continued and completed visit with audio only.     Vital signs may be patient reported or missing.  Subjective:   Dillon [name] is a 70 y.o. male who presents for Medicare Annual/Subsequent preventive examination.  Review of Systems     Cardiac Risk Factors include: advanced age (>6455men, 74>65 women);male gender;sedentary lifestyle     Objective:    Today's Vitals   02/08/21 1117  Weight: 220 lb (99.8 kg)  Height: 5\' 9"  (1.753 m)   Body mass index is 32.49 kg/m.  Advanced Directives 02/08/2021 01/23/2020 01/04/2016 12/15/2015  Does Patient Have a Medical Advance Directive? No No No No  Would patient like information on creating a medical advance directive? - - - No - patient declined information  Some encounter information is confidential and restricted. Go to Review Flowsheets activity to see all data.    Current Medications (verified) No outpatient encounter medications on file as of 02/08/2021.   No facility-administered encounter medications on file as of 02/08/2021.    Allergies (verified) Codeine   History: Past Medical History:  Diagnosis  Date  . Kidney stone   . Meningitis    spinal  . OCD (obsessive compulsive disorder)   . Shingles    Past Surgical History:  Procedure Laterality Date  . KIDNEY STONE SURGERY     Family History  Problem Relation Age of Onset  . Cancer Mother        bladder  . Emphysema Father   . Emphysema Sister   . COPD Sister   . Cancer Brother        prostate   Social History   Socioeconomic History  . Marital status: Widowed    Spouse name: Not on file  . Number of children: Not on file  . Years of education: Not on file  . Highest education level: Not on file  Occupational History  . Occupation: retired  Tobacco Use  . Smoking status: Never Smoker  . Smokeless tobacco: Never Used  Vaping Use  . Vaping Use: Never used  Substance and Sexual Activity  . Alcohol use: No    Alcohol/week: 0.0 standard drinks  . Drug use: No  . Sexual activity: Not Currently  Other Topics Concern  . Not on file  Social History Narrative  . Not on file   Social Determinants of Health   Financial Resource Strain: Low Risk   . Difficulty of Paying Living Expenses: Not hard at all  Food Insecurity: No Food Insecurity  . Worried About Programme researcher, broadcasting/film/videounning Out of Food in the Last Year: Never true  . Ran Out of Food in the Last Year: Never true  Transportation Needs: No Transportation Needs  . Lack of Transportation (Medical): No  . Lack of  Transportation (Non-Medical): No  Physical Activity: Inactive  . Days of Exercise per Week: 0 days  . Minutes of Exercise per Session: 0 min  Stress: No Stress Concern Present  . Feeling of Stress : Not at all  Social Connections: Not on file    Tobacco Counseling Counseling given: Not Answered   Clinical Intake:  Pre-visit preparation completed: Yes  Pain : No/denies pain     Nutritional Status: BMI > 30  Obese Nutritional Risks: None Diabetes: No  How often do you need to have someone help you when you read instructions, pamphlets, or other written  materials from your doctor or pharmacy?: 1 - Never What is the last grade level you completed in school?: 11th grade  Diabetic? no  Interpreter Needed?: No  Information entered by :: NAllen LPN   Activities of Daily Living In your present state of health, do you have any difficulty performing the following activities: 02/08/2021  Hearing? Y  Comment tinnitus in right ear  Vision? N  Difficulty concentrating or making decisions? N  Walking or climbing stairs? N  Dressing or bathing? N  Doing errands, shopping? N  Preparing Food and eating ? N  Using the Toilet? N  In the past six months, have you accidently leaked urine? Y  Do you have problems with loss of bowel control? N  Managing your Medications? N  Managing your Finances? N  Housekeeping or managing your Housekeeping? N  Some recent data might be hidden    Patient Care Team: Marjie Skiff, NP as PCP - General (Nurse Practitioner)  Indicate any recent Medical Services you may have received from other than [facility] providers in the past year (date may be approximate).     Assessment:   This is a routine wellness examination for Dillon [name].  Hearing/Vision screen No exam data present  Dietary issues and exercise activities discussed: Current Exercise Habits: The patient does not participate in regular exercise at present  Goals    . Patient Stated     02/08/2021, wants to walk more      Depression Screen PHQ 2/9 Scores 02/08/2021 04/22/2020 04/22/2020 01/23/2020 10/22/2019 02/03/2016 12/25/2015  PHQ - 2 Score 0 0 0 0 0 1 2  PHQ- 9 Score - 0 2 - - - 11    Fall Risk Fall Risk  02/08/2021 01/23/2020 10/22/2019  Falls in the past year? 0 0 0  Number falls in past yr: - 0 0  Injury with Fall? - 0 0  Risk for fall due to : No Fall Risks - -  Follow up Falls evaluation completed;Education provided;Falls prevention discussed - Falls evaluation completed    FALL RISK PREVENTION PERTAINING TO THE HOME:  Any stairs in or around the  home? Yes  If so, are there any without handrails? No  Home free of loose throw rugs in walkways, pet beds, electrical cords, etc? Yes  Adequate lighting in your home to reduce risk of falls? Yes   ASSISTIVE DEVICES UTILIZED TO PREVENT FALLS:  Life alert? No  Use of a cane, walker or w/c? No  Grab bars in the bathroom? No  Shower chair or bench in shower? Yes  Elevated toilet seat or a handicapped toilet? No   TIMED UP AND GO:  Was the test performed? No .   Cognitive Function:     6CIT Screen 02/08/2021  What Year? 0 points  What month? 0 points  What time? 0 points  Count back from 20  0 points  Months in reverse 0 points  Repeat phrase 4 points  Total Score 4    Immunizations  There is no immunization history on file for this patient.  TDAP status: Due, Education has been provided regarding the importance of this vaccine. Advised may receive this vaccine at local pharmacy or Health Dept. Aware to provide a copy of the vaccination record if obtained from local pharmacy or Health Dept. Verbalized acceptance and understanding.  Flu Vaccine status: Declined, Education has been provided regarding the importance of this vaccine but patient still declined. Advised may receive this vaccine at local pharmacy or Health Dept. Aware to provide a copy of the vaccination record if obtained from local pharmacy or Health Dept. Verbalized acceptance and understanding.  Pneumococcal vaccine status: Declined,  Education has been provided regarding the importance of this vaccine but patient still declined. Advised may receive this vaccine at local pharmacy or Health Dept. Aware to provide a copy of the vaccination record if obtained from local pharmacy or Health Dept. Verbalized acceptance and understanding.   Covid-19 vaccine status: Declined, Education has been provided regarding the importance of this vaccine but patient still declined. Advised may receive this vaccine at local pharmacy or  Health Dept.or vaccine clinic. Aware to provide a copy of the vaccination record if obtained from local pharmacy or Health Dept. Verbalized acceptance and understanding.  Qualifies for Shingles Vaccine? Yes   Zostavax completed No   Shingrix Completed?: No.    Education has been provided regarding the importance of this vaccine. Patient has been advised to call insurance company to determine out of pocket expense if they have not yet received this vaccine. Advised may also receive vaccine at local pharmacy or Health Dept. Verbalized acceptance and understanding.  Screening Tests Health Maintenance  Topic Date Due  . COVID-19 Vaccine (1) Never done  . TETANUS/TDAP  Never done  . COLONOSCOPY (Pts 45-7yrs Insurance coverage will need to be confirmed)  Never done  . PNA vac Low Risk Adult (1 of 2 - PCV13) 04/22/2021 (Originally 01/01/2018)  . INFLUENZA VACCINE  05/17/2021  . Hepatitis C Screening  Addressed  . HPV VACCINES  Aged Out    Health Maintenance  Health Maintenance Due  Topic Date Due  . COVID-19 Vaccine (1) Never done  . TETANUS/TDAP  Never done  . COLONOSCOPY (Pts 45-39yrs Insurance coverage will need to be confirmed)  Never done    Colorectal cancer screening: decline  Lung Cancer Screening: (Low Dose CT Chest recommended if Age 56-80 years, 30 pack-year currently smoking OR have quit w/in 15years.) does not qualify.   Lung Cancer Screening Referral: no  Additional Screening:  Hepatitis C Screening: does qualify; Completed 11/11/2015  Vision Screening: Recommended annual ophthalmology exams for early detection of glaucoma and other disorders of the eye. Is the patient up to date with their annual eye exam?  No  Who is the provider or what is the name of the office in which the patient attends annual eye exams? none If pt is not established with a provider, would they like to be referred to a provider to establish care? No .   Dental Screening: Recommended annual  dental exams for proper oral hygiene  Community Resource Referral / Chronic Care Management: CRR required this visit?  No   CCM required this visit?  No      Plan:     I have personally reviewed and noted the following in the patient's chart:   .  Medical and social history . Use of alcohol, tobacco or illicit drugs  . Current medications and supplements . Functional ability and status . Nutritional status . Physical activity . Advanced directives . List of other physicians . Hospitalizations, surgeries, and ER visits in previous 12 months . Vitals . Screenings to include cognitive, depression, and falls . Referrals and appointments  In addition, I have reviewed and discussed with patient certain preventive protocols, quality metrics, and best practice recommendations. A written personalized care plan for preventive services as well as general preventive health recommendations were provided to patient.     Barb Merino, LPN   [id number]   Nurse Notes:

## 2021-02-08 NOTE — Patient Instructions (Signed)
Dillon [name] , Thank you for taking time to come for your Medicare Wellness Visit. I appreciate your ongoing commitment to your health goals. Please review the following plan we discussed and let me know if I can assist you in the future.   Screening recommendations/referrals: Colonoscopy: decline Recommended yearly ophthalmology/optometry visit for glaucoma screening and checkup Recommended yearly dental visit for hygiene and checkup  Vaccinations: Influenza vaccine: decline Pneumococcal vaccine: decline Tdap vaccine: decline Shingles vaccine: decline   Covid-19:  decline  Advanced directives: Advance directive discussed with you today.   Conditions/risks identified: none  Next appointment: Follow up in one year for your annual wellness visit.   Preventive Care 68 Years and Older, Male Preventive care refers to lifestyle choices and visits with your health care provider that can promote health and wellness. What does preventive care include?  A yearly physical exam. This is also called an annual well check.  Dental exams once or twice a year.  Routine eye exams. Ask your health care provider how often you should have your eyes checked.  Personal lifestyle choices, including:  Daily care of your teeth and gums.  Regular physical activity.  Eating a healthy diet.  Avoiding tobacco and drug use.  Limiting alcohol use.  Practicing safe sex.  Taking low doses of aspirin every day.  Taking vitamin and mineral supplements as recommended by your health care provider. What happens during an annual well check? The services and screenings done by your health care provider during your annual well check will depend on your age, overall health, lifestyle risk factors, and family history of disease. Counseling  Your health care provider may ask you questions about your:  Alcohol use.  Tobacco use.  Drug use.  Emotional well-being.  Home and relationship  well-being.  Sexual activity.  Eating habits.  History of falls.  Memory and ability to understand (cognition).  Work and work Astronomer. Screening  You may have the following tests or measurements:  Height, weight, and BMI.  Blood pressure.  Lipid and cholesterol levels. These may be checked every 5 years, or more frequently if you are over 43 years old.  Skin check.  Lung cancer screening. You may have this screening every year starting at age 68 if you have a 30-pack-year history of smoking and currently smoke or have quit within the past 15 years.  Fecal occult blood test (FOBT) of the stool. You may have this test every year starting at age 68.  Flexible sigmoidoscopy or colonoscopy. You may have a sigmoidoscopy every 5 years or a colonoscopy every 10 years starting at age 68.  Prostate cancer screening. Recommendations will vary depending on your family history and other risks.  Hepatitis C blood test.  Hepatitis B blood test.  Sexually transmitted disease (STD) testing.  Diabetes screening. This is done by checking your blood sugar (glucose) after you have not eaten for a while (fasting). You may have this done every 1-3 years.  Abdominal aortic aneurysm (AAA) screening. You may need this if you are a current or former smoker.  Osteoporosis. You may be screened starting at age 31 if you are at high risk. Talk with your health care provider about your test results, treatment options, and if necessary, the need for more tests. Vaccines  Your health care provider may recommend certain vaccines, such as:  Influenza vaccine. This is recommended every year.  Tetanus, diphtheria, and acellular pertussis (Tdap, Td) vaccine. You may need a Td booster every 10  years.  Zoster vaccine. You may need this after age 68.  Pneumococcal 13-valent conjugate (PCV13) vaccine. One dose is recommended after age 68.  Pneumococcal polysaccharide (PPSV23) vaccine. One dose is  recommended after age 68. Talk to your health care provider about which screenings and vaccines you need and how often you need them. This information is not intended to replace advice given to you by your health care provider. Make sure you discuss any questions you have with your health care provider. Document Released: 10/30/2015 Document Revised: 06/22/2016 Document Reviewed: 08/04/2015 Elsevier Interactive Patient Education  2017 [place] Prevention in the Home Falls can cause injuries. They can happen to people of all ages. There are many things you can do to make your home safe and to help prevent falls. What can I do on the outside of my home?  Regularly fix the edges of walkways and driveways and fix any cracks.  Remove anything that might make you trip as you walk through a door, such as a raised step or threshold.  Trim any bushes or trees on the path to your home.  Use bright outdoor lighting.  Clear any walking paths of anything that might make someone trip, such as rocks or tools.  Regularly check to see if handrails are loose or broken. Make sure that both sides of any steps have handrails.  Any raised decks and porches should have guardrails on the edges.  Have any leaves, snow, or ice cleared regularly.  Use sand or salt on walking paths during winter.  Clean up any spills in your garage right away. This includes oil or grease spills. What can I do in the bathroom?  Use night lights.  Install grab bars by the toilet and in the tub and shower. Do not use towel bars as grab bars.  Use non-skid mats or decals in the tub or shower.  If you need to sit down in the shower, use a plastic, non-slip stool.  Keep the floor dry. Clean up any water that spills on the floor as soon as it happens.  Remove soap buildup in the tub or shower regularly.  Attach bath mats securely with double-sided non-slip rug tape.  Do not have throw rugs and other things on  the floor that can make you trip. What can I do in the bedroom?  Use night lights.  Make sure that you have a light by your bed that is easy to reach.  Do not use any sheets or blankets that are too big for your bed. They should not hang down onto the floor.  Have a firm chair that has side arms. You can use this for support while you get dressed.  Do not have throw rugs and other things on the floor that can make you trip. What can I do in the kitchen?  Clean up any spills right away.  Avoid walking on wet floors.  Keep items that you use a lot in easy-to-reach places.  If you need to reach something above you, use a strong step stool that has a grab bar.  Keep electrical cords out of the way.  Do not use floor polish or wax that makes floors slippery. If you must use wax, use non-skid floor wax.  Do not have throw rugs and other things on the floor that can make you trip. What can I do with my stairs?  Do not leave any items on the stairs.  Make sure that  there are handrails on both sides of the stairs and use them. Fix handrails that are broken or loose. Make sure that handrails are as long as the stairways.  Check any carpeting to make sure that it is firmly attached to the stairs. Fix any carpet that is loose or worn.  Avoid having throw rugs at the top or bottom of the stairs. If you do have throw rugs, attach them to the floor with carpet tape.  Make sure that you have a light switch at the top of the stairs and the bottom of the stairs. If you do not have them, ask someone to add them for you. What else can I do to help prevent falls?  Wear shoes that:  Do not have high heels.  Have rubber bottoms.  Are comfortable and fit you well.  Are closed at the toe. Do not wear sandals.  If you use a stepladder:  Make sure that it is fully opened. Do not climb a closed stepladder.  Make sure that both sides of the stepladder are locked into place.  Ask someone to  hold it for you, if possible.  Clearly mark and make sure that you can see:  Any grab bars or handrails.  First and last steps.  Where the edge of each step is.  Use tools that help you move around (mobility aids) if they are needed. These include:  Canes.  Walkers.  Scooters.  Crutches.  Turn on the lights when you go into a dark area. Replace any light bulbs as soon as they burn out.  Set up your furniture so you have a clear path. Avoid moving your furniture around.  If any of your floors are uneven, fix them.  If there are any pets around you, be aware of where they are.  Review your medicines with your doctor. Some medicines can make you feel dizzy. This can increase your chance of falling. Ask your doctor what other things that you can do to help prevent falls. This information is not intended to replace advice given to you by your health care provider. Make sure you discuss any questions you have with your health care provider. Document Released: 07/30/2009 Document Revised: 03/10/2016 Document Reviewed: 11/07/2014 Elsevier Interactive Patient Education  2017 [employer].

## 2021-09-28 ENCOUNTER — Encounter: Payer: Self-pay | Admitting: Nurse Practitioner

## 2021-09-28 ENCOUNTER — Other Ambulatory Visit: Payer: Self-pay

## 2021-09-28 ENCOUNTER — Ambulatory Visit (INDEPENDENT_AMBULATORY_CARE_PROVIDER_SITE_OTHER): Payer: Medicare HMO | Admitting: Nurse Practitioner

## 2021-09-28 VITALS — BP 160/110 | HR 108 | Temp 97.9°F | Wt 204.0 lb

## 2021-09-28 DIAGNOSIS — Z1159 Encounter for screening for other viral diseases: Secondary | ICD-10-CM

## 2021-09-28 DIAGNOSIS — Z114 Encounter for screening for human immunodeficiency virus [HIV]: Secondary | ICD-10-CM

## 2021-09-28 DIAGNOSIS — R030 Elevated blood-pressure reading, without diagnosis of hypertension: Secondary | ICD-10-CM

## 2021-09-28 DIAGNOSIS — R350 Frequency of micturition: Secondary | ICD-10-CM

## 2021-09-28 DIAGNOSIS — Z13220 Encounter for screening for lipoid disorders: Secondary | ICD-10-CM

## 2021-09-28 DIAGNOSIS — F422 Mixed obsessional thoughts and acts: Secondary | ICD-10-CM | POA: Diagnosis not present

## 2021-09-28 DIAGNOSIS — E1165 Type 2 diabetes mellitus with hyperglycemia: Secondary | ICD-10-CM | POA: Diagnosis not present

## 2021-09-28 DIAGNOSIS — R5383 Other fatigue: Secondary | ICD-10-CM | POA: Diagnosis not present

## 2021-09-28 DIAGNOSIS — Z136 Encounter for screening for cardiovascular disorders: Secondary | ICD-10-CM

## 2021-09-28 DIAGNOSIS — Z8639 Personal history of other endocrine, nutritional and metabolic disease: Secondary | ICD-10-CM

## 2021-09-28 DIAGNOSIS — Z6830 Body mass index (BMI) 30.0-30.9, adult: Secondary | ICD-10-CM

## 2021-09-28 DIAGNOSIS — R634 Abnormal weight loss: Secondary | ICD-10-CM

## 2021-09-28 DIAGNOSIS — E6609 Other obesity due to excess calories: Secondary | ICD-10-CM

## 2021-09-28 LAB — URINALYSIS, ROUTINE W REFLEX MICROSCOPIC
Bilirubin, UA: NEGATIVE
Ketones, UA: NEGATIVE
Leukocytes,UA: NEGATIVE
Nitrite, UA: NEGATIVE
Specific Gravity, UA: 1.02 (ref 1.005–1.030)
Urobilinogen, Ur: 0.2 mg/dL (ref 0.2–1.0)
pH, UA: 5 (ref 5.0–7.5)

## 2021-09-28 LAB — MICROSCOPIC EXAMINATION
Bacteria, UA: NONE SEEN
Epithelial Cells (non renal): NONE SEEN /hpf (ref 0–10)
WBC, UA: NONE SEEN /hpf (ref 0–5)

## 2021-09-28 LAB — MICROALBUMIN, URINE WAIVED
Creatinine, Urine Waived: 50 mg/dL (ref 10–300)
Microalb, Ur Waived: 80 mg/L — ABNORMAL HIGH (ref 0–19)

## 2021-09-28 LAB — BAYER DCA HB A1C WAIVED: HB A1C (BAYER DCA - WAIVED): 9.9 % — ABNORMAL HIGH (ref 4.8–5.6)

## 2021-09-28 MED ORDER — ONETOUCH ULTRA VI STRP: ORAL_STRIP | 12 refills | Status: DC

## 2021-09-28 MED ORDER — METFORMIN HCL 500 MG PO TABS: 500.0000 mg | ORAL_TABLET | Freq: Two times a day (BID) | ORAL | 0 refills | Status: DC

## 2021-09-28 MED ORDER — ONETOUCH ULTRASOFT LANCETS MISC: 12 refills | Status: DC

## 2021-09-28 MED ORDER — ONETOUCH ULTRA 2 W/DEVICE KIT: 1.0000 | PACK | Freq: Every day | 0 refills | Status: DC

## 2021-09-28 NOTE — Assessment & Plan Note (Signed)
A1C today in office 9.9%. Discussed starting metformin 500mg  BID and lisinopril 5mg  daily after getting CMP back tomorrow. Discussed diet and exercise. Discussed possible complications of diabetes. Glucometer and supplies ordered, instructed him to start checking blood sugars daily. Will add on statin next visit. Still need to educate on foot care and eye exams. Follow up in 1 month.

## 2021-09-28 NOTE — Assessment & Plan Note (Signed)
BMI 30 today. Has lost 25 pounds unintentionally in the last year. Check CMP, CBC, TSH. Discussed limiting carbs and added sugars.

## 2021-09-28 NOTE — Patient Instructions (Addendum)
Start metformin 1 tablet twice a day with food Will start lisinopril 1 tablet daily I am also order blood glucose monitoring supplies  Check your blood sugar once a day, in the morning before you eat

## 2021-09-28 NOTE — Assessment & Plan Note (Signed)
Blood pressure elevated today, even on recheck. Discussed starting lisinopril with new onset diabetes. Will wait for CMP to result first before starting medication as he has not had labs done in 6 years. Encouraged him to check his blood pressure at home.

## 2021-09-28 NOTE — Assessment & Plan Note (Signed)
He states that his anxiety and OCD symptoms are slightly improving. He was on medication in the past, however he does not feel like he needs medication at this time. Follow up with any concerns.

## 2021-09-28 NOTE — Progress Notes (Signed)
Established Patient Office Visit  Subjective:  Patient ID: Dillon [name], male    DOB: 1950/12/13  Age: 70 y.o. MRN: [id number]  CC:  Chief Complaint  Patient presents with   Urinary Frequency    Patient states he recently noticed he has been urinating a lot, wakes up about 3-5 times a night.   Weight Loss    Patient states in about 6 months he has lot about 20 pounds    Fatigue    Patient states he feels more weak lately    Anxiety    Patient states he occasionally feels like he is more aggravated than usual, feels like things have to be perfect. Patient states he does not like to be enclosed.       HPI Dillon [name] presents for urinary frequency, weight loss, fatigue, and anxiety.   Has lost approximately 25 pounds over the last year. This is associated with fatigue. He denies changes in his appetite, fevers, and night sweats.   He has also started noticing urinary frequency. Last night he went to the bathroom 4 times overnight. Denies dysuria, hematuria, fevers, nausea/vomiting. He also endorses excessive thirst.    ANXIETY/STRESS  Duration:better Anxious mood: yes  Excessive worrying: yes Irritability: no  Sweating: no Nausea: no Palpitations:no Hyperventilation: no Panic attacks: yes - at times, in specific situations Agoraphobia: no  Obscessions/compulsions: yes Depressed mood: no Depression screen [facility] 2/9 09/28/2021 02/08/2021 04/22/2020 04/22/2020 01/23/2020  Decreased Interest 0 0 0 0 0  Down, Depressed, Hopeless 2 0 0 0 0  PHQ - 2 Score 2 0 0 0 0  Altered sleeping 0 - 0 1 -  Tired, decreased energy 0 - 0 1 -  Change in appetite 0 - 0 0 -  Feeling bad or failure about yourself  0 - 0 0 -  Trouble concentrating 0 - 0 0 -  Moving slowly or fidgety/restless 0 - 0 0 -  Suicidal thoughts 0 - 0 0 -  PHQ-9 Score 2 - 0 2 -  Difficult doing work/chores Not difficult at all - Not difficult at all Not difficult at all -   GAD 7 : Generalized Anxiety Score  09/28/2021 04/22/2020 12/01/2015  Nervous, Anxious, on Edge 2 0 3  Control/stop worrying 3 0 3  Worry too much - different things 3 0 3  Trouble relaxing 1 0 3  Restless 2 0 3  Easily annoyed or irritable 2 0 3  Afraid - awful might happen 3 0 3  Total GAD 7 Score 16 0 21  Anxiety Difficulty Not difficult at all Not difficult at all Very difficult    Anhedonia: no Weight changes: yes Insomnia: no   Hypersomnia: no Fatigue/loss of energy: yes Feelings of worthlessness: no Feelings of guilt: no Impaired concentration/indecisiveness: yes Suicidal ideations: no  Crying spells: no Recent Stressors/Life Changes: no   Relationship problems: no   Family stress: no     Financial stress: no    Job stress: no    Recent death/loss: no   Past Medical History:  Diagnosis Date   Kidney stone    Meningitis    spinal   OCD (obsessive compulsive disorder)    Shingles     Past Surgical History:  Procedure Laterality Date   KIDNEY STONE SURGERY      Family History  Problem Relation Age of Onset   Cancer Mother        bladder   Emphysema Father  Emphysema Sister    COPD Sister    Cancer Brother        prostate    Social History   Socioeconomic History   Marital status: Widowed    Spouse name: Not on file   Number of children: Not on file   Years of education: Not on file   Highest education level: Not on file  Occupational History   Occupation: retired  Tobacco Use   Smoking status: Never   Smokeless tobacco: Never  Vaping Use   Vaping Use: Never used  Substance and Sexual Activity   Alcohol use: No    Alcohol/week: 0.0 standard drinks   Drug use: No   Sexual activity: Not Currently  Other Topics Concern   Not on file  Social History Narrative   Not on file   Social Determinants of Health   Financial Resource Strain: Low Risk    Difficulty of Paying Living Expenses: Not hard at all  Food Insecurity: No Food Insecurity   Worried About Charity fundraiser  in the Last Year: Never true   [place] in the Last Year: Never true  Transportation Needs: No Transportation Needs   Lack of Transportation (Medical): No   Lack of Transportation (Non-Medical): No  Physical Activity: Inactive   Days of Exercise per Week: 0 days   Minutes of Exercise per Session: 0 min  Stress: No Stress Concern Present   Feeling of Stress : Not at all  Social Connections: Not on file  Intimate Partner Violence: Not on file    No outpatient medications prior to visit.   No facility-administered medications prior to visit.    Allergies  Allergen Reactions   Codeine Other (See Comments)    Unsure of reaction    ROS Review of Systems  Constitutional:  Positive for fatigue and unexpected weight change. Negative for appetite change.  HENT: Negative.    Respiratory: Negative.    Cardiovascular: Negative.   Gastrointestinal: Negative.   Endocrine: Positive for polydipsia and polyuria. Negative for polyphagia.  Genitourinary:  Positive for frequency. Negative for dysuria.  Skin: Negative.   Neurological: Negative.   Psychiatric/Behavioral:  The patient is nervous/anxious.      Objective:    Physical Exam Vitals and nursing note reviewed.  Constitutional:      Appearance: Normal appearance.  HENT:     Head: Normocephalic.  Eyes:     Conjunctiva/sclera: Conjunctivae normal.  Cardiovascular:     Rate and Rhythm: Normal rate and regular rhythm.     Pulses: Normal pulses.     Heart sounds: Normal heart sounds.  Pulmonary:     Effort: Pulmonary effort is normal.     Breath sounds: Normal breath sounds.  Abdominal:     Palpations: Abdomen is soft.     Tenderness: There is no abdominal tenderness.  Musculoskeletal:     Cervical back: Normal range of motion.     Right lower leg: No edema.     Left lower leg: No edema.  Skin:    General: Skin is warm and dry.  Neurological:     General: No focal deficit present.     Mental Status: He is alert  and oriented to person, place, and time.  Psychiatric:        Mood and Affect: Mood normal.        Behavior: Behavior normal.        Thought Content: Thought content normal.  Judgment: Judgment normal.    BP (!) 160/110   Pulse (!) 108   Temp 97.9 F (36.6 C)   Wt 204 lb (92.5 kg)   SpO2 95%   BMI 30.13 kg/m  Wt Readings from Last 3 Encounters:  09/28/21 204 lb (92.5 kg)  02/08/21 220 lb (99.8 kg)  04/22/20 225 lb 12.8 oz (102.4 kg)     Health Maintenance Due  Topic Date Due   COVID-19 Vaccine (1) Never done   Pneumonia Vaccine 13+ Years old (1 - PCV) Never done   FOOT EXAM  Never done   OPHTHALMOLOGY EXAM  Never done   Zoster Vaccines- Shingrix (1 of 2) Never done    There are no preventive care reminders to display for this patient.  Lab Results  Component Value Date   TSH 0.798 03/27/2017   Lab Results  Component Value Date   WBC 8.9 03/22/2017   HGB 15.1 03/22/2017   HCT 41.5 03/22/2017   MCV 88.2 03/22/2017   PLT 162 03/22/2017   Lab Results  Component Value Date   NA 141 03/23/2017   K 3.6 03/23/2017   CO2 23 03/23/2017   GLUCOSE 98 03/23/2017   BUN 11 03/23/2017   CREATININE 1.04 03/23/2017   BILITOT 2.5 (H) 03/22/2017   ALKPHOS 90 03/22/2017   AST 34 03/22/2017   ALT 53 03/22/2017   PROT 5.2 (L) 03/22/2017   ALBUMIN 3.2 (L) 03/22/2017   CALCIUM 9.7 03/23/2017   ANIONGAP 11 03/23/2017   Lab Results  Component Value Date   CHOL 137 03/27/2017   Lab Results  Component Value Date   HDL 30 (L) 03/27/2017   Lab Results  Component Value Date   LDLCALC 84 03/27/2017   Lab Results  Component Value Date   TRIG 117 03/27/2017   Lab Results  Component Value Date   CHOLHDL 4.6 03/27/2017   Lab Results  Component Value Date   HGBA1C 9.9 (H) 09/28/2021      Assessment & Plan:   Problem List Items Addressed This Visit       Endocrine   Type 2 diabetes mellitus with hyperglycemia, without long-term current use of insulin  (HCC)    A1C today in office 9.9%. Discussed starting metformin 561m BID and lisinopril 538mdaily after getting CMP back tomorrow. Discussed diet and exercise. Discussed possible complications of diabetes. Glucometer and supplies ordered, instructed him to start checking blood sugars daily. Will add on statin next visit. Still need to educate on foot care and eye exams. Follow up in 1 month.       Relevant Medications   metFORMIN (GLUCOPHAGE) 500 MG tablet   Blood Glucose Monitoring Suppl (ONE TOUCH ULTRA 2) w/Device KIT   Lancets (ONETOUCH ULTRASOFT) lancets   glucose blood (ONETOUCH ULTRA) test strip   Other Relevant Orders   Microalbumin, Urine Waived (Completed)     Other   OCD (obsessive compulsive disorder)    He states that his anxiety and OCD symptoms are slightly improving. He was on medication in the past, however he does not feel like he needs medication at this time. Follow up with any concerns.       Elevated BP without diagnosis of hypertension    Blood pressure elevated today, even on recheck. Discussed starting lisinopril with new onset diabetes. Will wait for CMP to result first before starting medication as he has not had labs done in 6 years. Encouraged him to check his blood pressure at home.  Obesity    BMI 30 today. Has lost 25 pounds unintentionally in the last year. Check CMP, CBC, TSH. Discussed limiting carbs and added sugars.       Relevant Medications   metFORMIN (GLUCOPHAGE) 500 MG tablet   Other Visit Diagnoses     Frequent urination    -  Primary   U/A negative for UTI, however does show 3+ glucose   Relevant Orders   Bayer DCA Hb A1c Waived (Completed)   PSA   Urinalysis, Routine w reflex microscopic (Completed)   Weight loss       Recent weight loss of 25 pounds over the last year. Denies blood in stool. Most likely r/t new onset diabetes.   Relevant Orders   CBC with Differential/Platelet   Comprehensive metabolic panel   Thyroid Panel  With TSH   Iron, TIBC and Ferritin Panel   Fatigue, unspecified type       Check CMP, CBC, TSH, A1C, and iron panel and treat based on results   Relevant Orders   CBC with Differential/Platelet   Comprehensive metabolic panel   Thyroid Panel With TSH   Iron, TIBC and Ferritin Panel   Screening for HIV (human immunodeficiency virus)       Check HIV today   Relevant Orders   HIV Antibody (routine testing w rflx)   Encounter for hepatitis C screening test for low risk patient       Check hepatitis C today   Relevant Orders   Hepatitis C antibody   Encounter for lipid screening for cardiovascular disease       Check lipid panel today. Discussed starting statin due to new onset diabetes   Relevant Orders   Lipid Panel w/o Chol/HDL Ratio   History of vitamin D deficiency       Check vitamin D level today and treat as needed   Relevant Orders   Vitamin D (25 hydroxy)   H/O non anemic vitamin B12 deficiency       Check vitamin B12 today and treat as needed   Relevant Orders   Vitamin B12       Meds ordered this encounter  Medications   metFORMIN (GLUCOPHAGE) 500 MG tablet    Sig: Take 1 tablet (500 mg total) by mouth 2 (two) times daily with a meal.    Dispense:  180 tablet    Refill:  0   Blood Glucose Monitoring Suppl (ONE TOUCH ULTRA 2) w/Device KIT    Sig: 1 each by Does not apply route daily.    Dispense:  1 kit    Refill:  0   Lancets (ONETOUCH ULTRASOFT) lancets    Sig: Use as instructed    Dispense:  100 each    Refill:  12   glucose blood (ONETOUCH ULTRA) test strip    Sig: Use as instructed    Dispense:  100 each    Refill:  12     Follow-up: Return in about 4 weeks (around 10/26/2021) for Diabetes.    Charyl Dancer, NP

## 2021-09-29 LAB — LIPID PANEL W/O CHOL/HDL RATIO
Cholesterol, Total: 238 mg/dL — ABNORMAL HIGH (ref 100–199)
HDL: 51 mg/dL (ref >39)
LDL Chol Calc (NIH): 159 mg/dL — ABNORMAL HIGH (ref 0–99)
Triglycerides: 156 mg/dL — ABNORMAL HIGH (ref 0–149)
VLDL Cholesterol Cal: 28 mg/dL (ref 5–40)

## 2021-09-29 LAB — CBC WITH DIFFERENTIAL/PLATELET
Basophils Absolute: 0 10*3/uL (ref 0.0–0.2)
Basos: 1 %
EOS (ABSOLUTE): 0.1 10*3/uL (ref 0.0–0.4)
Eos: 1 %
Hematocrit: 56.8 % — ABNORMAL HIGH (ref 37.5–51.0)
Hemoglobin: 19.9 g/dL — ABNORMAL HIGH (ref 13.0–17.7)
Immature Grans (Abs): 0 10*3/uL (ref 0.0–0.1)
Immature Granulocytes: 0 %
Lymphocytes Absolute: 2.7 10*3/uL (ref 0.7–3.1)
Lymphs: 33 %
MCH: 31.3 pg (ref 26.6–33.0)
MCHC: 35 g/dL (ref 31.5–35.7)
MCV: 89 fL (ref 79–97)
Monocytes Absolute: 0.6 10*3/uL (ref 0.1–0.9)
Monocytes: 7 %
Neutrophils Absolute: 4.7 10*3/uL (ref 1.4–7.0)
Neutrophils: 58 %
Platelets: 150 10*3/uL (ref 150–450)
RBC: 6.36 x10E6/uL — ABNORMAL HIGH (ref 4.14–5.80)
RDW: 12 % (ref 11.6–15.4)
WBC: 8.2 10*3/uL (ref 3.4–10.8)

## 2021-09-29 LAB — COMPREHENSIVE METABOLIC PANEL
ALT: 64 IU/L — ABNORMAL HIGH (ref 0–44)
AST: 35 IU/L (ref 0–40)
Albumin/Globulin Ratio: 2.4 — ABNORMAL HIGH (ref 1.2–2.2)
Albumin: 4.8 g/dL (ref 3.8–4.8)
Alkaline Phosphatase: 167 IU/L — ABNORMAL HIGH (ref 44–121)
BUN/Creatinine Ratio: 14 (ref 10–24)
BUN: 13 mg/dL (ref 8–27)
Bilirubin Total: 0.8 mg/dL (ref 0.0–1.2)
CO2: 21 mmol/L (ref 20–29)
Calcium: 10.3 mg/dL — ABNORMAL HIGH (ref 8.6–10.2)
Chloride: 101 mmol/L (ref 96–106)
Creatinine, Ser: 0.94 mg/dL (ref 0.76–1.27)
Globulin, Total: 2 g/dL (ref 1.5–4.5)
Glucose: 339 mg/dL — ABNORMAL HIGH (ref 70–99)
Potassium: 4.2 mmol/L (ref 3.5–5.2)
Sodium: 140 mmol/L (ref 134–144)
Total Protein: 6.8 g/dL (ref 6.0–8.5)
eGFR: 88 mL/min/{1.73_m2} (ref >59)

## 2021-09-29 LAB — IRON,TIBC AND FERRITIN PANEL
Ferritin: 598 ng/mL — ABNORMAL HIGH (ref 30–400)
Iron Saturation: 37 % (ref 15–55)
Iron: 117 ug/dL (ref 38–169)
Total Iron Binding Capacity: 313 ug/dL (ref 250–450)
UIBC: 196 ug/dL (ref 111–343)

## 2021-09-29 LAB — THYROID PANEL WITH TSH
Free Thyroxine Index: 2.5 (ref 1.2–4.9)
T3 Uptake Ratio: 24 % (ref 24–39)
T4, Total: 10.5 ug/dL (ref 4.5–12.0)
TSH: 1.15 u[IU]/mL (ref 0.450–4.500)

## 2021-09-29 LAB — PSA: Prostate Specific Ag, Serum: 3.1 ng/mL (ref 0.0–4.0)

## 2021-09-29 LAB — VITAMIN B12: Vitamin B-12: 239 pg/mL (ref 232–1245)

## 2021-09-29 LAB — HEPATITIS C ANTIBODY: Hep C Virus Ab: <0.1 s/co ratio (ref 0.0–0.9)

## 2021-09-29 LAB — HIV ANTIBODY (ROUTINE TESTING W REFLEX): HIV Screen 4th Generation wRfx: NONREACTIVE

## 2021-09-29 LAB — VITAMIN D 25 HYDROXY (VIT D DEFICIENCY, FRACTURES): Vit D, 25-Hydroxy: 7.5 ng/mL — ABNORMAL LOW (ref 30.0–100.0)

## 2021-09-29 MED ORDER — LISINOPRIL 5 MG PO TABS: 5.0000 mg | ORAL_TABLET | Freq: Every day | ORAL | 0 refills | Status: DC

## 2021-09-29 MED ORDER — VITAMIN D (ERGOCALCIFEROL) 1.25 MG (50000 UNIT) PO CAPS: 50000.0000 [IU] | ORAL_CAPSULE | ORAL | 0 refills | Status: DC

## 2021-09-29 NOTE — Addendum Note (Signed)
Addended by: Rodman Pickle A on: 09/29/2021 08:57 AM   Modules accepted: Orders

## 2021-10-31 DIAGNOSIS — E119 Type 2 diabetes mellitus without complications: Secondary | ICD-10-CM | POA: Insufficient documentation

## 2021-10-31 DIAGNOSIS — E1169 Type 2 diabetes mellitus with other specified complication: Secondary | ICD-10-CM | POA: Insufficient documentation

## 2021-10-31 DIAGNOSIS — E1129 Type 2 diabetes mellitus with other diabetic kidney complication: Secondary | ICD-10-CM | POA: Insufficient documentation

## 2021-10-31 DIAGNOSIS — R809 Proteinuria, unspecified: Secondary | ICD-10-CM | POA: Insufficient documentation

## 2021-11-02 ENCOUNTER — Other Ambulatory Visit: Payer: Self-pay

## 2021-11-02 ENCOUNTER — Ambulatory Visit (INDEPENDENT_AMBULATORY_CARE_PROVIDER_SITE_OTHER): Payer: Medicare HMO | Admitting: Nurse Practitioner

## 2021-11-02 ENCOUNTER — Encounter: Payer: Self-pay | Admitting: Nurse Practitioner

## 2021-11-02 VITALS — BP 130/83 | HR 96 | Temp 98.5°F | Ht 69.0 in | Wt 209.0 lb

## 2021-11-02 DIAGNOSIS — E1165 Type 2 diabetes mellitus with hyperglycemia: Secondary | ICD-10-CM

## 2021-11-02 MED ORDER — LISINOPRIL 5 MG PO TABS: 5.0000 mg | ORAL_TABLET | Freq: Every day | ORAL | 4 refills | Status: DC

## 2021-11-02 MED ORDER — METFORMIN HCL 500 MG PO TABS: 1000.0000 mg | ORAL_TABLET | Freq: Two times a day (BID) | ORAL | 4 refills | Status: DC

## 2021-11-02 NOTE — Patient Instructions (Signed)

## 2021-11-02 NOTE — Progress Notes (Signed)
BP 130/83    Pulse 96    Temp 98.5 F (36.9 C) (Oral)    Ht _0  (1.753 m)    Wt 209 lb (94.8 kg)    SpO2 97%    BMI 30.86 kg/m    Subjective:    Patient ID: Dillon [name], male    DOB: [birth year], 71 y.o.   MRN: [id number]  HPI: Dillon [name] is a 71 y.o. male  Chief Complaint  Patient presents with   Diabetes    Patient states he has been watching his diet and what he eats and taking his medication as instructed. Patient states this morning when he checked it. He states it was under 200 and states it is heading in the right direction. Patient denies having a recent Diabetic Eye Exam.    DIABETES A1c in December upon initial visit was 9.9% and was started on Metformin + Lisinopril.  He is also taking Vitamin D for low levels on labs -- weekly dosing.  Reports he is tolerating Metformin and Lisinopril well without ADR.   Hypoglycemic episodes:no Polydipsia/polyuria:  improving -- less urination Visual disturbance: no Chest pain: no Paresthesias: no Glucose Monitoring: yes  Accucheck frequency: Daily  Fasting glucose: initially 347 when first checking, last 4 times is <200  Post prandial:  Evening:  Before meals: Taking Insulin?: no  Long acting insulin:  Short acting insulin: Blood Pressure Monitoring: not checking Retinal Examination: Not up to Date Foot Exam: Up to Date Diabetic Education: Not Completed Pneumovax: Not up to Date Influenza: Not up to Date Aspirin: no   Relevant past medical, surgical, family and social history reviewed and updated as indicated. Interim medical history since our last visit reviewed. Allergies and medications reviewed and updated.  Review of Systems  Constitutional:  Negative for activity change, diaphoresis, fatigue and fever.  Respiratory:  Negative for cough, chest tightness, shortness of breath and wheezing.   Cardiovascular:  Negative for chest pain, palpitations and leg swelling.  Gastrointestinal: Negative.    Neurological: Negative.   Psychiatric/Behavioral:  Negative for decreased concentration, self-injury, sleep disturbance and suicidal ideas. The patient is not nervous/anxious.    Per HPI unless specifically indicated above     Objective:    BP 130/83    Pulse 96    Temp 98.5 F (36.9 C) (Oral)    Ht _1  (1.753 m)    Wt 209 lb (94.8 kg)    SpO2 97%    BMI 30.86 kg/m   Wt Readings from Last 3 Encounters:  11/02/21 209 lb (94.8 kg)  09/28/21 204 lb (92.5 kg)  02/08/21 220 lb (99.8 kg)    Physical Exam Vitals and nursing note reviewed.  Constitutional:      General: He is awake. He is not in acute distress.    Appearance: He is well-developed and well-groomed. He is obese. He is not ill-appearing.  HENT:     Head: Normocephalic and atraumatic.     Right Ear: Hearing normal. No drainage.     Left Ear: Hearing normal. No drainage.  Eyes:     General: Lids are normal.        Right eye: No discharge.        Left eye: No discharge.     Conjunctiva/sclera: Conjunctivae normal.     Pupils: Pupils are equal, round, and reactive to light.  Neck:     Thyroid: No thyromegaly.     Vascular: No carotid bruit.  Trachea: Trachea normal.  Cardiovascular:     Rate and Rhythm: Normal rate and regular rhythm.     Heart sounds: Normal heart sounds, S1 normal and S2 normal. No murmur heard.   No gallop.  Pulmonary:     Effort: Pulmonary effort is normal. No accessory muscle usage or respiratory distress.     Breath sounds: Normal breath sounds.  Abdominal:     General: Bowel sounds are normal.     Palpations: Abdomen is soft.  Musculoskeletal:        General: Normal range of motion.     Cervical back: Normal range of motion and neck supple.     Right lower leg: No edema.     Left lower leg: No edema.  Skin:    General: Skin is warm and dry.     Capillary Refill: Capillary refill takes less than 2 seconds.  Neurological:     Mental Status: He is alert and oriented to person, place,  and time.     Deep Tendon Reflexes: Reflexes are normal and symmetric.  Psychiatric:        Attention and Perception: Attention normal.        Mood and Affect: Mood normal.        Speech: Speech normal.        Behavior: Behavior normal. Behavior is cooperative.        Thought Content: Thought content normal.   Diabetic Foot Exam - Simple   Simple Foot Form Visual Inspection No deformities, no ulcerations, no other skin breakdown bilaterally: Yes Sensation Testing Intact to touch and monofilament testing bilaterally: Yes Pulse Check Posterior Tibialis and Dorsalis pulse intact bilaterally: Yes Comments     Results for orders placed or performed in visit on 09/28/21  Microscopic Examination   BLD  Result Value Ref Range   WBC, UA None seen 0 - 5 /hpf   RBC 11-30 (A) 0 - 2 /hpf   Epithelial Cells (non renal) None seen 0 - 10 /hpf   Mucus, UA Present (A) Not Estab.   Bacteria, UA None seen None seen/Few  Bayer DCA Hb A1c Waived  Result Value Ref Range   HB A1C (BAYER DCA - WAIVED) 9.9 (H) 4.8 - 5.6 %  CBC with Differential/Platelet  Result Value Ref Range   WBC 8.2 3.4 - 10.8 x10E3/uL   RBC 6.36 (H) 4.14 - 5.80 x10E6/uL   Hemoglobin 19.9 (H) 13.0 - 17.7 g/dL   Hematocrit 56.8 (H) 37.5 - 51.0 %   MCV 89 79 - 97 fL   MCH 31.3 26.6 - 33.0 pg   MCHC 35.0 31.5 - 35.7 g/dL   RDW 12.0 11.6 - 15.4 %   Platelets 150 150 - 450 x10E3/uL   Neutrophils 58 Not Estab. %   Lymphs 33 Not Estab. %   Monocytes 7 Not Estab. %   Eos 1 Not Estab. %   Basos 1 Not Estab. %   Neutrophils Absolute 4.7 1.4 - 7.0 x10E3/uL   Lymphocytes Absolute 2.7 0.7 - 3.1 x10E3/uL   Monocytes Absolute 0.6 0.1 - 0.9 x10E3/uL   EOS (ABSOLUTE) 0.1 0.0 - 0.4 x10E3/uL   Basophils Absolute 0.0 0.0 - 0.2 x10E3/uL   Immature Granulocytes 0 Not Estab. %   Immature Grans (Abs) 0.0 0.0 - 0.1 x10E3/uL  Comprehensive metabolic panel  Result Value Ref Range   Glucose 339 (H) 70 - 99 mg/dL   BUN 13 8 - 27 mg/dL    Creatinine, Ser  0.94 0.76 - 1.27 mg/dL   eGFR 88 >59 mL/min/1.73   BUN/Creatinine Ratio 14 10 - 24   Sodium 140 134 - 144 mmol/L   Potassium 4.2 3.5 - 5.2 mmol/L   Chloride 101 96 - 106 mmol/L   CO2 21 20 - 29 mmol/L   Calcium 10.3 (H) 8.6 - 10.2 mg/dL   Total Protein 6.8 6.0 - 8.5 g/dL   Albumin 4.8 3.8 - 4.8 g/dL   Globulin, Total 2.0 1.5 - 4.5 g/dL   Albumin/Globulin Ratio 2.4 (H) 1.2 - 2.2   Bilirubin Total 0.8 0.0 - 1.2 mg/dL   Alkaline Phosphatase 167 (H) 44 - 121 IU/L   AST 35 0 - 40 IU/L   ALT 64 (H) 0 - 44 IU/L  Hepatitis C antibody  Result Value Ref Range   Hep C Virus Ab <0.1 0.0 - 0.9 s/co ratio  HIV Antibody (routine testing w rflx)  Result Value Ref Range   HIV Screen 4th Generation wRfx Non Reactive Non Reactive  Lipid Panel w/o Chol/HDL Ratio  Result Value Ref Range   Cholesterol, Total 238 (H) 100 - 199 mg/dL   Triglycerides 156 (H) 0 - 149 mg/dL   HDL 51 >39 mg/dL   VLDL Cholesterol Cal 28 5 - 40 mg/dL   LDL Chol Calc (NIH) 159 (H) 0 - 99 mg/dL  PSA  Result Value Ref Range   Prostate Specific Ag, Serum 3.1 0.0 - 4.0 ng/mL  Thyroid Panel With TSH  Result Value Ref Range   TSH 1.150 0.450 - 4.500 uIU/mL   T4, Total 10.5 4.5 - 12.0 ug/dL   T3 Uptake Ratio 24 24 - 39 %   Free Thyroxine Index 2.5 1.2 - 4.9  Urinalysis, Routine w reflex microscopic  Result Value Ref Range   Specific Gravity, UA 1.020 1.005 - 1.030   pH, UA 5.0 5.0 - 7.5   Color, UA Yellow Yellow   Appearance Ur Clear Clear   Leukocytes,UA Negative Negative   Protein,UA 1+ (A) Negative/Trace   Glucose, UA 3+ (A) Negative   Ketones, UA Negative Negative   RBC, UA 3+ (A) Negative   Bilirubin, UA Negative Negative   Urobilinogen, Ur 0.2 0.2 - 1.0 mg/dL   Nitrite, UA Negative Negative   Microscopic Examination See below:   Iron, TIBC and Ferritin Panel  Result Value Ref Range   Total Iron Binding Capacity 313 250 - 450 ug/dL   UIBC 196 111 - 343 ug/dL   Iron 117 38 - 169 ug/dL   Iron  Saturation 37 15 - 55 %   Ferritin 598 (H) 30 - 400 ng/mL  Vitamin D (25 hydroxy)  Result Value Ref Range   Vit D, 25-Hydroxy 7.5 (L) 30.0 - 100.0 ng/mL  Vitamin B12  Result Value Ref Range   Vitamin B-12 239 232 - 1,245 pg/mL  Microalbumin, Urine Waived  Result Value Ref Range   Microalb, Ur Waived 80 (H) 0 - 19 mg/L   Creatinine, Urine Waived 50 10 - 300 mg/dL   Microalb/Creat Ratio 30-300 (H) <30 mg/g      Assessment & Plan:   Problem List Items Addressed This Visit       Endocrine   Type 2 diabetes mellitus with hyperglycemia, without long-term current use of insulin ([place]) - Primary    New diagnosis in December 2022 -- A1c 9.9% at the time.  BS at home is trending down, but not to goal.  Recommend we increase Metformin to  1000 MG BID, educated him on this and sent in new prescription.  Continue to check BS at home daily and document.  Focus on diabetic diet.  Return in 2 months for A1c recheck.      Relevant Medications   metFORMIN (GLUCOPHAGE) 500 MG tablet   lisinopril (ZESTRIL) 5 MG tablet   Other Relevant Orders   Ambulatory referral to Ophthalmology     Follow up plan: Return in about 2 months (around 12/31/2021) for T2DM, HTN/HLD, VIT D.

## 2021-11-02 NOTE — Assessment & Plan Note (Signed)
New diagnosis in December 2022 -- A1c 9.9% at the time.  BS at home is trending down, but not to goal.  Recommend we increase Metformin to 1000 MG BID, educated him on this and sent in new prescription.  Continue to check BS at home daily and document.  Focus on diabetic diet.  Return in 2 months for A1c recheck.

## 2021-11-22 DIAGNOSIS — H353131 Nonexudative age-related macular degeneration, bilateral, early dry stage: Secondary | ICD-10-CM | POA: Diagnosis not present

## 2021-11-22 DIAGNOSIS — H5203 Hypermetropia, bilateral: Secondary | ICD-10-CM | POA: Diagnosis not present

## 2021-11-22 LAB — HM DIABETES EYE EXAM

## 2021-12-26 NOTE — Patient Instructions (Signed)

## 2021-12-31 ENCOUNTER — Encounter: Payer: Self-pay | Admitting: Nurse Practitioner

## 2021-12-31 ENCOUNTER — Other Ambulatory Visit: Payer: Self-pay

## 2021-12-31 ENCOUNTER — Telehealth: Payer: Self-pay | Admitting: Nurse Practitioner

## 2021-12-31 ENCOUNTER — Ambulatory Visit (INDEPENDENT_AMBULATORY_CARE_PROVIDER_SITE_OTHER): Payer: Medicare HMO | Admitting: Nurse Practitioner

## 2021-12-31 VITALS — BP 132/87 | HR 86 | Temp 98.0°F | Ht 69.0 in | Wt 216.0 lb

## 2021-12-31 DIAGNOSIS — E559 Vitamin D deficiency, unspecified: Secondary | ICD-10-CM | POA: Diagnosis not present

## 2021-12-31 DIAGNOSIS — E538 Deficiency of other specified B group vitamins: Secondary | ICD-10-CM | POA: Diagnosis not present

## 2021-12-31 DIAGNOSIS — E1169 Type 2 diabetes mellitus with other specified complication: Secondary | ICD-10-CM | POA: Diagnosis not present

## 2021-12-31 DIAGNOSIS — E6609 Other obesity due to excess calories: Secondary | ICD-10-CM | POA: Diagnosis not present

## 2021-12-31 DIAGNOSIS — E1129 Type 2 diabetes mellitus with other diabetic kidney complication: Secondary | ICD-10-CM | POA: Diagnosis not present

## 2021-12-31 DIAGNOSIS — E785 Hyperlipidemia, unspecified: Secondary | ICD-10-CM

## 2021-12-31 DIAGNOSIS — Z6830 Body mass index (BMI) 30.0-30.9, adult: Secondary | ICD-10-CM

## 2021-12-31 DIAGNOSIS — R030 Elevated blood-pressure reading, without diagnosis of hypertension: Secondary | ICD-10-CM

## 2021-12-31 DIAGNOSIS — R809 Proteinuria, unspecified: Secondary | ICD-10-CM | POA: Diagnosis not present

## 2021-12-31 DIAGNOSIS — E1165 Type 2 diabetes mellitus with hyperglycemia: Secondary | ICD-10-CM

## 2021-12-31 DIAGNOSIS — F325 Major depressive disorder, single episode, in full remission: Secondary | ICD-10-CM | POA: Diagnosis not present

## 2021-12-31 MED ORDER — ONETOUCH ULTRA VI STRP: ORAL_STRIP | 12 refills | Status: DC

## 2021-12-31 MED ORDER — LISINOPRIL 5 MG PO TABS: 5.0000 mg | ORAL_TABLET | Freq: Every day | ORAL | 4 refills | Status: DC

## 2021-12-31 MED ORDER — ROSUVASTATIN CALCIUM 10 MG PO TABS
10.0000 mg | ORAL_TABLET | Freq: Every day | ORAL | 4 refills | Status: DC
Start: 2021-12-31 — End: 2022-10-11

## 2021-12-31 NOTE — Telephone Encounter (Signed)
Sent to PCP via refill request ?

## 2021-12-31 NOTE — Assessment & Plan Note (Signed)
Chronic, stable without medication.  Denies SI/HI. ?

## 2021-12-31 NOTE — Assessment & Plan Note (Signed)
Noted past labs and is taking supplement, recheck today. ?

## 2021-12-31 NOTE — Telephone Encounter (Signed)
Copied from CRM [id number]. Topic: General - Other ?>> Dec 31, 2021  2:51 PM Jaquita Rector A wrote: ?Reason for CRM: [facility] called in to get directions for Rx that came in today for test strips. Please call or send new Rx with directions on how patient will be testing ?

## 2021-12-31 NOTE — Assessment & Plan Note (Signed)
Ongoing with LDL 154 recent check, educated him on statin and diabetes.  He is in agreeance to start statin today.  Will start Rosuvastatin 10 MG and monitor, adjust as needed.  Lipid panel today. ?

## 2021-12-31 NOTE — Assessment & Plan Note (Signed)
Refer to T2DM with hyperglycemia plan of care.  Continue Lisinopril for kidney protection. ?

## 2021-12-31 NOTE — Assessment & Plan Note (Signed)
New diagnosis in December 2022 -- A1c 9.9% at the time -- will recheck A1c and urine ALB today.  BS at home is trending down, but not to goal.  Recommend we continue Metformin 1000 MG BID, educated him on this.  Continue to check BS at home daily and document.  Focus on diabetic diet.  Return in 3 months for A1c recheck, sooner if medication changes made. ?

## 2021-12-31 NOTE — Telephone Encounter (Signed)
Please include directions for use and resend  ?

## 2021-12-31 NOTE — Assessment & Plan Note (Signed)
BMI 31.90.  Recommended eating smaller high protein, low fat meals more frequently and exercising 30 mins a day 5 times a week with a goal of 10-15lb weight loss in the next 3 months. Patient voiced their understanding and motivation to adhere to these recommendations. ? ?

## 2021-12-31 NOTE — Progress Notes (Signed)
? ?BP 132/87   Pulse 86   Temp 98 ?F (36.7 ?C) (Oral)   Ht 5\' 9"  (1.753 m)   Wt 216 lb (98 kg)   SpO2 98%   BMI 31.90 kg/m?   ? ?Subjective:  ? ? Patient ID: Dillon [name], male    DOB: 04-Aug-1951, 71 y.o.   MRN: [id number] ? ?HPI: ?Dillon [name] is a 72 y.o. male ? ?Chief Complaint  ?Patient presents with  ? Diabetes  ? Hyperlipidemia  ? Hypertension  ? vitamin D  ? Medication Refill  ?  Patient is requesting a refill on his test strips and his blood pressure medication Lisinopril at today's visit.   ? ?DIABETES ?Initial diagnosis: A1c in December was 9.9% and was started on Metformin + Lisinopril.  He is also taking Vitamin D for low levels on labs -- weekly dosing.  Reports he is tolerating Metformin and Lisinopril well without ADR.   ?Hypoglycemic episodes:no ?Polydipsia/polyuria: none ?Visual disturbance: no ?Chest pain: no ?Paresthesias: no ?Glucose Monitoring: yes ? Accucheck frequency: Daily ? Fasting glucose: 150 to 170 ? Post prandial: ? Evening: ? Before meals: ?Taking Insulin?: no ? Long acting insulin: ? Short acting insulin: ?Blood Pressure Monitoring: not checking ?Retinal Examination: Up To Date -- macular degeneration ?Foot Exam: Up to Date ?Diabetic Education: Not Completed ?Pneumovax: Not up to Date ?Influenza: Not up to Date ?Aspirin: no  ? ?HYPERTENSION / HYPERLIPIDEMIA ?Currently taking Lisinopril 5 MG daily.  No statin.   ?Satisfied with current treatment? yes ?Duration of hypertension: chronic ?BP monitoring frequency: not checking ?BP range:  ?BP medication side effects: no ?Past BP meds:  ?Duration of hyperlipidemia: chronic ?Aspirin: no ?Recent stressors: no ?Recurrent headaches: no ?Visual changes: no ?Palpitations: no ?Dyspnea: no ?Chest pain: no ?Lower extremity edema: no ?Dizzy/lightheaded: no  ? ?Depression screen [facility] 2/9 12/31/2021 11/02/2021 09/28/2021 02/08/2021 04/22/2020  ?Decreased Interest 0 0 0 0 0  ?Down, Depressed, Hopeless 1 0 2 0 0  ?PHQ - 2 Score 1 0 2 0 0   ?Altered sleeping 2 0 0 - 0  ?Tired, decreased energy 0 1 0 - 0  ?Change in appetite 0 0 0 - 0  ?Feeling bad or failure about yourself  0 0 0 - 0  ?Trouble concentrating 0 0 0 - 0  ?Moving slowly or fidgety/restless 0 0 0 - 0  ?Suicidal thoughts 0 0 0 - 0  ?PHQ-9 Score 3 1 2  - 0  ?Difficult doing work/chores Not difficult at all - Not difficult at all - Not difficult at all  ?Some recent data might be hidden  ?  ? ?Relevant past medical, surgical, family and social history reviewed and updated as indicated. Interim medical history since our last visit reviewed. ?Allergies and medications reviewed and updated. ? ?Review of Systems  ?Constitutional:  Negative for activity change, diaphoresis, fatigue and fever.  ?Respiratory:  Negative for cough, chest tightness, shortness of breath and wheezing.   ?Cardiovascular:  Negative for chest pain, palpitations and leg swelling.  ?Gastrointestinal: Negative.   ?Neurological: Negative.   ?Psychiatric/Behavioral:  Negative for decreased concentration, self-injury, sleep disturbance and suicidal ideas. The patient is not nervous/anxious.   ? ?Per HPI unless specifically indicated above ? ?   ?Objective:  ?  ?BP 132/87   Pulse 86   Temp 98 ?F (36.7 ?C) (Oral)   Ht 5\' 9"  (1.753 m)   Wt 216 lb (98 kg)   SpO2 98%   BMI 31.90 kg/m?   ?  Wt Readings from Last 3 Encounters:  ?12/31/21 216 lb (98 kg)  ?11/02/21 209 lb (94.8 kg)  ?09/28/21 204 lb (92.5 kg)  ?  ?Physical Exam ?Vitals and nursing note reviewed.  ?Constitutional:   ?   General: He is awake. He is not in acute distress. ?   Appearance: He is well-developed and well-groomed. He is obese. He is not ill-appearing.  ?HENT:  ?   Head: Normocephalic and atraumatic.  ?   Right Ear: Hearing normal. No drainage.  ?   Left Ear: Hearing normal. No drainage.  ?Eyes:  ?   General: Lids are normal.     ?   Right eye: No discharge.     ?   Left eye: No discharge.  ?   Conjunctiva/sclera: Conjunctivae normal.  ?   Pupils: Pupils are  equal, round, and reactive to light.  ?Neck:  ?   Thyroid: No thyromegaly.  ?   Vascular: No carotid bruit.  ?   Trachea: Trachea normal.  ?Cardiovascular:  ?   Rate and Rhythm: Normal rate and regular rhythm.  ?   Heart sounds: Normal heart sounds, S1 normal and S2 normal. No murmur heard. ?  No gallop.  ?Pulmonary:  ?   Effort: Pulmonary effort is normal. No accessory muscle usage or respiratory distress.  ?   Breath sounds: Normal breath sounds.  ?Abdominal:  ?   General: Bowel sounds are normal.  ?   Palpations: Abdomen is soft.  ?Musculoskeletal:     ?   General: Normal range of motion.  ?   Cervical back: Normal range of motion and neck supple.  ?   Right lower leg: No edema.  ?   Left lower leg: No edema.  ?Skin: ?   General: Skin is warm and dry.  ?   Capillary Refill: Capillary refill takes less than 2 seconds.  ?Neurological:  ?   Mental Status: He is alert and oriented to person, place, and time.  ?   Deep Tendon Reflexes: Reflexes are normal and symmetric.  ?Psychiatric:     ?   Attention and Perception: Attention normal.     ?   Mood and Affect: Mood normal.     ?   Speech: Speech normal.     ?   Behavior: Behavior normal. Behavior is cooperative.     ?   Thought Content: Thought content normal.  ? ?Results for orders placed or performed in visit on 12/29/21  ?HM DIABETES EYE EXAM  ?Result Value Ref Range  ? HM Diabetic Eye Exam No Retinopathy No Retinopathy  ? ?   ?Assessment & Plan:  ? ?Problem List Items Addressed This Visit   ? ?  ? Endocrine  ? Hyperlipidemia associated with type 2 diabetes mellitus (HCC)  ?  Ongoing with LDL 154 recent check, educated him on statin and diabetes.  He is in agreeance to start statin today.  Will start Rosuvastatin 10 MG and monitor, adjust as needed.  Lipid panel today. ?  ?  ? Relevant Medications  ? lisinopril (ZESTRIL) 5 MG tablet  ? rosuvastatin (CRESTOR) 10 MG tablet  ? Other Relevant Orders  ? Comprehensive metabolic panel  ? Lipid Panel w/o Chol/HDL Ratio  ?  Type 2 diabetes mellitus with hyperglycemia, without long-term current use of insulin (HCC)  ?  New diagnosis in December 2022 -- A1c 9.9% at the time -- will recheck A1c and urine ALB today.  BS at home is  trending down, but not to goal.  Recommend we continue Metformin 1000 MG BID, educated him on this.  Continue to check BS at home daily and document.  Focus on diabetic diet.  Return in 3 months for A1c recheck, sooner if medication changes made. ?  ?  ? Relevant Medications  ? lisinopril (ZESTRIL) 5 MG tablet  ? glucose blood (ONETOUCH ULTRA) test strip  ? rosuvastatin (CRESTOR) 10 MG tablet  ? Other Relevant Orders  ? Comprehensive metabolic panel  ? HgB A1c  ? Urine Microalbumin w/creat. ratio  ? Type 2 diabetes mellitus with proteinuria (HCC) - Primary  ?  Refer to T2DM with hyperglycemia plan of care.  Continue Lisinopril for kidney protection. ?  ?  ? Relevant Medications  ? lisinopril (ZESTRIL) 5 MG tablet  ? rosuvastatin (CRESTOR) 10 MG tablet  ? Other Relevant Orders  ? Comprehensive metabolic panel  ? HgB A1c  ? Urine Microalbumin w/creat. ratio  ?  ? Other  ? Depression, major, single episode, complete remission (HCC)  ?  Chronic, stable without medication.  Denies SI/HI. ?  ?  ? Obesity  ?  BMI 31.90.  Recommended eating smaller high protein, low fat meals more frequently and exercising 30 mins a day 5 times a week with a goal of 10-15lb weight loss in the next 3 months. Patient voiced their understanding and motivation to adhere to these recommendations. ? ?  ?  ? Vitamin D deficiency  ?  Noted past labs and is taking supplement, recheck today. ?  ?  ? Relevant Orders  ? VITAMIN D 25 Hydroxy (Vit-D Deficiency, Fractures)  ? ?Other Visit Diagnoses   ? ? Vitamin B12 deficiency      ? History of low levels reported, check today with Metformin on board and start supplement as needed.  ? Relevant Orders  ? Vitamin B12  ? ?  ?  ? ?Follow up plan: ?Return in about 3 months (around 04/02/2022) for T2DM,  HTN/HLD.  ?

## 2022-01-01 ENCOUNTER — Encounter: Payer: Self-pay | Admitting: Nurse Practitioner

## 2022-01-01 ENCOUNTER — Other Ambulatory Visit: Payer: Self-pay | Admitting: Nurse Practitioner

## 2022-01-01 DIAGNOSIS — E538 Deficiency of other specified B group vitamins: Secondary | ICD-10-CM | POA: Insufficient documentation

## 2022-01-01 LAB — LIPID PANEL W/O CHOL/HDL RATIO
Cholesterol, Total: 184 mg/dL (ref 100–199)
HDL: 39 mg/dL — ABNORMAL LOW (ref >39)
LDL Chol Calc (NIH): 108 mg/dL — ABNORMAL HIGH (ref 0–99)
Triglycerides: 212 mg/dL — ABNORMAL HIGH (ref 0–149)
VLDL Cholesterol Cal: 37 mg/dL (ref 5–40)

## 2022-01-01 LAB — COMPREHENSIVE METABOLIC PANEL
ALT: 29 IU/L (ref 0–44)
AST: 19 IU/L (ref 0–40)
Albumin/Globulin Ratio: 2.3 — ABNORMAL HIGH (ref 1.2–2.2)
Albumin: 4.2 g/dL (ref 3.8–4.8)
Alkaline Phosphatase: 120 IU/L (ref 44–121)
BUN/Creatinine Ratio: 13 (ref 10–24)
BUN: 11 mg/dL (ref 8–27)
Bilirubin Total: 0.6 mg/dL (ref 0.0–1.2)
CO2: 24 mmol/L (ref 20–29)
Calcium: 9.8 mg/dL (ref 8.6–10.2)
Chloride: 104 mmol/L (ref 96–106)
Creatinine, Ser: 0.85 mg/dL (ref 0.76–1.27)
Globulin, Total: 1.8 g/dL (ref 1.5–4.5)
Glucose: 103 mg/dL — ABNORMAL HIGH (ref 70–99)
Potassium: 3.9 mmol/L (ref 3.5–5.2)
Sodium: 145 mmol/L — ABNORMAL HIGH (ref 134–144)
Total Protein: 6 g/dL (ref 6.0–8.5)
eGFR: 95 mL/min/{1.73_m2} (ref >59)

## 2022-01-01 LAB — VITAMIN B12: Vitamin B-12: 204 pg/mL — ABNORMAL LOW (ref 232–1245)

## 2022-01-01 LAB — VITAMIN D 25 HYDROXY (VIT D DEFICIENCY, FRACTURES): Vit D, 25-Hydroxy: 27.6 ng/mL — ABNORMAL LOW (ref 30.0–100.0)

## 2022-01-01 LAB — MICROALBUMIN / CREATININE URINE RATIO
Creatinine, Urine: 76.3 mg/dL
Microalb/Creat Ratio: 114 mg/g creat — ABNORMAL HIGH (ref 0–29)
Microalbumin, Urine: 87.3 ug/mL

## 2022-01-01 LAB — HEMOGLOBIN A1C
Est. average glucose Bld gHb Est-mCnc: 143 mg/dL
Hgb A1c MFr Bld: 6.6 % — ABNORMAL HIGH (ref 4.8–5.6)

## 2022-01-01 MED ORDER — VITAMIN B-12 1000 MCG PO TABS: 1000.0000 ug | ORAL_TABLET | Freq: Every day | ORAL | 4 refills | Status: DC

## 2022-01-01 NOTE — Progress Notes (Signed)
Good morning crew, please let Dillon [name] know labs have returned: ?- A1c, diabetes testing, has MUCH improved = was 9.9% and now with treatment 6.6%, this means we are at goal.  Continue Metformin daily.  GREAT JOB!! ?- Kidney function, creatinine and eGFR, remains normal, as is liver function, AST and ALT.  Sodium, salt, level mildly elevated I recommend cut back on salt intake. ?- Cholesterol levels remain elevated, recommend you start the Rosuvastatin I sent in for you to lower these levels as we discussed. ?- Vitamin D level remains on low side but is improving, continue weekly supplement. ?- Vitamin B12 level is on low side.  I will send in supplement for you to take for this as it is very important for nervous system, including brain, health.  Any questions? ?Keep being amazing!!  Thank you for allowing me to participate in your care.  I appreciate you. ?Kindest regards, ?Jerrilyn Messinger ?

## 2022-01-03 ENCOUNTER — Telehealth: Payer: Self-pay | Admitting: *Deleted

## 2022-01-03 ENCOUNTER — Telehealth: Payer: Self-pay | Admitting: Nurse Practitioner

## 2022-01-03 MED ORDER — ACCU-CHEK GUIDE ME W/DEVICE KIT: PACK | 0 refills | Status: DC

## 2022-01-03 MED ORDER — ACCU-CHEK GUIDE VI STRP: ORAL_STRIP | 12 refills | Status: DC

## 2022-01-03 NOTE — Telephone Encounter (Signed)
Pt called or lab results, results and provider comments given. Questions answered, pt to pick up meds from pharm. Verbalized understanding. ?

## 2022-01-03 NOTE — Telephone Encounter (Signed)
Pt called in states was told that the glucose blood (ONETOUCH ULTRA) test strip is fading away , so he is looking for another alternative. Please call back. ?

## 2022-01-03 NOTE — Telephone Encounter (Signed)
Spoke with pharmacy and she informed me patient that his insurance is requiring a prior authorization, but she says if the provider could change the prescription over to Accu-Chek Guide as that is what his insurance covers. Pharmacist says that if provider writes a new Rx, then the patient would need a new script for the Accu-Chek meter and strips over to the pharmacy. Please advise? ?

## 2022-01-05 ENCOUNTER — Ambulatory Visit: Payer: Self-pay | Admitting: *Deleted

## 2022-01-05 ENCOUNTER — Other Ambulatory Visit: Payer: Self-pay

## 2022-01-05 MED ORDER — ACCU-CHEK FASTCLIX LANCETS MISC: 5 refills | Status: DC

## 2022-01-05 NOTE — Telephone Encounter (Signed)
[facility] pharmacy called to clarify order for one touch test strips. Order needs to match kit device ordered or insurance will not cover. Please order generic test strips and pharmacy will be able to fill for insurance to pay. Requesting a call back for verbal order.  ?

## 2022-01-05 NOTE — Telephone Encounter (Signed)
Spoke to [employer], only thing missing is lancets. Sent refill request to provider.  ?

## 2022-02-10 ENCOUNTER — Ambulatory Visit (INDEPENDENT_AMBULATORY_CARE_PROVIDER_SITE_OTHER): Payer: Medicare HMO | Admitting: *Deleted

## 2022-02-10 DIAGNOSIS — Z0000 Encounter for general adult medical examination without abnormal findings: Secondary | ICD-10-CM | POA: Diagnosis not present

## 2022-02-10 NOTE — Patient Instructions (Signed)
[name] , ?Thank you for taking time to come for your Medicare Wellness Visit. I appreciate your ongoing commitment to your health goals. Please review the following plan we discussed and let me know if I can assist you in the future.  ? ?Screening recommendations/referrals: ?Colonoscopy: declined ?Recommended yearly ophthalmology/optometry visit for glaucoma screening and checkup ?Recommended yearly dental visit for hygiene and checkup ? ?Vaccinations: ?Influenza vaccine: declined ?Pneumococcal vaccine: declined ?Tdap vaccine: declined ?Shingles vaccine: declined   ? ?Advanced directives: Education provided ? ?Preventive Care 6465 Years and Older, Male ?Preventive care refers to lifestyle choices and visits with your health care provider that can promote health and wellness. ?What does preventive care include? ?A yearly physical exam. This is also called an annual well check. ?Dental exams once or twice a year. ?Routine eye exams. Ask your health care provider how often you should have your eyes checked. ?Personal lifestyle choices, including: ?Daily care of your teeth and gums. ?Regular physical activity. ?Eating a healthy diet. ?Avoiding tobacco and drug use. ?Limiting alcohol use. ?Practicing safe sex. ?Taking low doses of aspirin every day. ?Taking vitamin and mineral supplements as recommended by your health care provider. ?What happens during an annual well check? ?The services and screenings done by your health care provider during your annual well check will depend on your age, overall health, lifestyle risk factors, and family history of disease. ?Counseling  ?Your health care provider may ask you questions about your: ?Alcohol use. ?Tobacco use. ?Drug use. ?Emotional well-being. ?Home and relationship well-being. ?Sexual activity. ?Eating habits. ?History of falls. ?Memory and ability to understand (cognition). ?Work and work Astronomerenvironment. ?Screening  ?You may have the following tests or  measurements: ?Height, weight, and BMI. ?Blood pressure. ?Lipid and cholesterol levels. These may be checked every 5 years, or more frequently if you are over 69 years old. ?Skin check. ?Lung cancer screening. You may have this screening every year starting at age 69 if you have a 30-pack-year history of smoking and currently smoke or have quit within the past 15 years. ?Fecal occult blood test (FOBT) of the stool. You may have this test every year starting at age 69. ?Flexible sigmoidoscopy or colonoscopy. You may have a sigmoidoscopy every 5 years or a colonoscopy every 10 years starting at age 69. ?Prostate cancer screening. Recommendations will vary depending on your family history and other risks. ?Hepatitis C blood test. ?Hepatitis B blood test. ?Sexually transmitted disease (STD) testing. ?Diabetes screening. This is done by checking your blood sugar (glucose) after you have not eaten for a while (fasting). You may have this done every 1-3 years. ?Abdominal aortic aneurysm (AAA) screening. You may need this if you are a current or former smoker. ?Osteoporosis. You may be screened starting at age 69 if you are at high risk. ?Talk with your health care provider about your test results, treatment options, and if necessary, the need for more tests. ?Vaccines  ?Your health care provider may recommend certain vaccines, such as: ?Influenza vaccine. This is recommended every year. ?Tetanus, diphtheria, and acellular pertussis (Tdap, Td) vaccine. You may need a Td booster every 10 years. ?Zoster vaccine. You may need this after age 69. ?Pneumococcal 13-valent conjugate (PCV13) vaccine. One dose is recommended after age 69. ?Pneumococcal polysaccharide (PPSV23) vaccine. One dose is recommended after age 69. ?Talk to your health care provider about which screenings and vaccines you need and how often you need them. ?This information is not intended to replace advice given to you by  your health care provider. Make sure  you discuss any questions you have with your health care provider. ?Document Released: 10/30/2015 Document Revised: 06/22/2016 Document Reviewed: 08/04/2015 ?Elsevier Interactive Patient Education ? 2017 [employer] ? ?Fall Prevention in the Home ?Falls can cause injuries. They can happen to people of all ages. There are many things you can do to make your home safe and to help prevent falls. ?What can I do on the outside of my home? ?Regularly fix the edges of walkways and driveways and fix any cracks. ?Remove anything that might make you trip as you walk through a door, such as a raised step or threshold. ?Trim any bushes or trees on the path to your home. ?Use bright outdoor lighting. ?Clear any walking paths of anything that might make someone trip, such as rocks or tools. ?Regularly check to see if handrails are loose or broken. Make sure that both sides of any steps have handrails. ?Any raised decks and porches should have guardrails on the edges. ?Have any leaves, snow, or ice cleared regularly. ?Use sand or salt on walking paths during winter. ?Clean up any spills in your garage right away. This includes oil or grease spills. ?What can I do in the bathroom? ?Use night lights. ?Install grab bars by the toilet and in the tub and shower. Do not use towel bars as grab bars. ?Use non-skid mats or decals in the tub or shower. ?If you need to sit down in the shower, use a plastic, non-slip stool. ?Keep the floor dry. Clean up any water that spills on the floor as soon as it happens. ?Remove soap buildup in the tub or shower regularly. ?Attach bath mats securely with double-sided non-slip rug tape. ?Do not have throw rugs and other things on the floor that can make you trip. ?What can I do in the bedroom? ?Use night lights. ?Make sure that you have a light by your bed that is easy to reach. ?Do not use any sheets or blankets that are too big for your bed. They should not hang down onto the floor. ?Have a firm  chair that has side arms. You can use this for support while you get dressed. ?Do not have throw rugs and other things on the floor that can make you trip. ?What can I do in the kitchen? ?Clean up any spills right away. ?Avoid walking on wet floors. ?Keep items that you use a lot in easy-to-reach places. ?If you need to reach something above you, use a strong step stool that has a grab bar. ?Keep electrical cords out of the way. ?Do not use floor polish or wax that makes floors slippery. If you must use wax, use non-skid floor wax. ?Do not have throw rugs and other things on the floor that can make you trip. ?What can I do with my stairs? ?Do not leave any items on the stairs. ?Make sure that there are handrails on both sides of the stairs and use them. Fix handrails that are broken or loose. Make sure that handrails are as long as the stairways. ?Check any carpeting to make sure that it is firmly attached to the stairs. Fix any carpet that is loose or worn. ?Avoid having throw rugs at the top or bottom of the stairs. If you do have throw rugs, attach them to the floor with carpet tape. ?Make sure that you have a light switch at the top of the stairs and the bottom of the stairs. If you  do not have them, ask someone to add them for you. ?What else can I do to help prevent falls? ?Wear shoes that: ?Do not have high heels. ?Have rubber bottoms. ?Are comfortable and fit you well. ?Are closed at the toe. Do not wear sandals. ?If you use a stepladder: ?Make sure that it is fully opened. Do not climb a closed stepladder. ?Make sure that both sides of the stepladder are locked into place. ?Ask someone to hold it for you, if possible. ?Clearly mark and make sure that you can see: ?Any grab bars or handrails. ?First and last steps. ?Where the edge of each step is. ?Use tools that help you move around (mobility aids) if they are needed. These include: ?Canes. ?Walkers. ?Scooters. ?Crutches. ?Turn on the lights when you go  into a dark area. Replace any light bulbs as soon as they burn out. ?Set up your furniture so you have a clear path. Avoid moving your furniture around. ?If any of your floors are uneven, fix them. ?If there are any pets aroun

## 2022-02-10 NOTE — Progress Notes (Signed)
? ?Subjective:  ? Dillon [name] is a 71 y.o. male who presents for Medicare Annual/Subsequent preventive examination. ? ?I connected with  [name] on 02/10/22 by a telephone enabled telemedicine application and verified that I am speaking with the correct person using two identifiers. ?  ?I discussed the limitations of evaluation and management by telemedicine. The patient expressed understanding and agreed to proceed. ? ?Patient location: home ? ?Provider location: TELE-HEALTH NOT IN OFFICE ? ? ? ?Review of Systems    ? ?Cardiac Risk Factors include: advanced age (>37mn, >>26women);diabetes mellitus;obesity (BMI >30kg/m2);male gender ? ?   ?Objective:  ?  ?Today's Vitals  ? ?There is no height or weight on file to calculate BMI. ? ? ?  02/08/2021  ? 11:21 AM 01/23/2020  ?  9:18 AM 04/06/2017  ?  1:44 PM 03/22/2017  ? 11:00 PM 01/29/2016  ?  2:24 PM 01/04/2016  ?  9:11 PM 01/04/2016  ?  5:06 PM  ?Advanced Directives  ?Does Patient Have a Medical Advance Directive? No No     No  ?Would patient like information on creating a medical advance directive?         ?  ? Information is confidential and restricted. Go to Review Flowsheets to unlock data.  ? ? ?Current Medications (verified) ?Outpatient Encounter Medications as of 02/10/2022  ?Medication Sig  ? Accu-Chek FastClix Lancets MISC To check blood sugar 2-3 times daily  ? Blood Glucose Monitoring Suppl (ACCU-CHEK GUIDE ME) w/Device KIT Use to check blood sugars 2-3 times daily with goals = <130 fasting in morning and <180 two hours after eating.  Bring blood sugar log to appointments.  ? glucose blood (ACCU-CHEK GUIDE) test strip Use to check blood sugars 2-3 times daily with goals = <130 fasting in morning and <180 two hours after eating. Bring blood sugar log to appointments.  ? Lancets (ONETOUCH ULTRASOFT) lancets Use as instructed  ? Lancets Misc. (ACCU-CHEK SOFTCLIX LANCET DEV) KIT   ? lisinopril (ZESTRIL) 5 MG tablet Take 1 tablet (5 mg total) by mouth  daily.  ? metFORMIN (GLUCOPHAGE) 500 MG tablet Take 2 tablets (1,000 mg total) by mouth 2 (two) times daily with a meal.  ? rosuvastatin (CRESTOR) 10 MG tablet Take 1 tablet (10 mg total) by mouth daily.  ? vitamin B-12 (CYANOCOBALAMIN) 1000 MCG tablet Take 1 tablet (1,000 mcg total) by mouth daily.  ? Vitamin D, Ergocalciferol, (DRISDOL) 1.25 MG (50000 UNIT) CAPS capsule Take 1 capsule (50,000 Units total) by mouth every 7 (seven) days.  ? ?No facility-administered encounter medications on file as of 02/10/2022.  ? ? ?Allergies (verified) ?Codeine  ? ?History: ?Past Medical History:  ?Diagnosis Date  ? Kidney stone   ? Meningitis   ? spinal  ? OCD (obsessive compulsive disorder)   ? Shingles   ? ?Past Surgical History:  ?Procedure Laterality Date  ? KIDNEY STONE SURGERY    ? ?Family History  ?Problem Relation Age of Onset  ? Cancer Mother   ?     bladder  ? Emphysema Father   ? Emphysema Sister   ? COPD Sister   ? Cancer Brother   ?     prostate  ? ?Social History  ? ?Socioeconomic History  ? Marital status: Widowed  ?  Spouse name: Not on file  ? Number of children: Not on file  ? Years of education: Not on file  ? Highest education level: Not on file  ?Occupational History  ?  Occupation: retired  ?Tobacco Use  ? Smoking status: Never  ? Smokeless tobacco: Never  ?Vaping Use  ? Vaping Use: Never used  ?Substance and Sexual Activity  ? Alcohol use: No  ?  Alcohol/week: 0.0 standard drinks  ? Drug use: No  ? Sexual activity: Not Currently  ?Other Topics Concern  ? Not on file  ?Social History Narrative  ? Not on file  ? ?Social Determinants of Health  ? ?Financial Resource Strain: Low Risk   ? Difficulty of Paying Living Expenses: Not hard at all  ?Food Insecurity: No Food Insecurity  ? Worried About Charity fundraiser in the Last Year: Never true  ? Ran Out of Food in the Last Year: Never true  ?Transportation Needs: No Transportation Needs  ? Lack of Transportation (Medical): No  ? Lack of Transportation  (Non-Medical): No  ?Physical Activity: Inactive  ? Days of Exercise per Week: 0 days  ? Minutes of Exercise per Session: 0 min  ?Stress: No Stress Concern Present  ? Feeling of Stress : Not at all  ?Social Connections: Moderately Integrated  ? Frequency of Communication with Friends and Family: Twice a week  ? Frequency of Social Gatherings with Friends and Family: Twice a week  ? Attends Religious Services: 1 to 4 times per year  ? Active Member of Clubs or Organizations: No  ? Attends Archivist Meetings: More than 4 times per year  ? Marital Status: Widowed  ? ? ?Tobacco Counseling ?Counseling given: Not Answered ? ? ?Clinical Intake: ? ?Pre-visit preparation completed: Yes ? ?Pain : No/denies pain ? ?  ? ?Nutritional Risks: None ?Diabetes: Yes ?CBG done?: No ?Did pt. bring in CBG monitor from home?: No ? ?How often do you need to have someone help you when you read instructions, pamphlets, or other written materials from your doctor or pharmacy?: 1 - Never ? ?Diabetic?  Yes ? ?Nutrition Risk Assessment: ? ?Has the patient had any N/V/D within the last 2 months?  No  ?Does the patient have any non-healing wounds?  No  ?Has the patient had any unintentional weight loss or weight gain?  No  ? ?Diabetes: ? ?Is the patient diabetic?  Yes  ?If diabetic, was a CBG obtained today?  No  ?Did the patient bring in their glucometer from home?  No  ?How often do you monitor your CBG's? Every other day.  ? ?Financial Strains and Diabetes Management: ? ?Are you having any financial strains with the device, your supplies or your medication? No .  ?Does the patient want to be seen by Chronic Care Management for management of their diabetes?  No  ?Would the patient like to be referred to a Nutritionist or for Diabetic Management?  No  ? ?Diabetic Exams: ? ?Diabetic Eye Exam: Completed .  Pt has been advised about the importance in completing this exam.  ?Diabetic Foot Exam:. Pt has been advised about the importance in  completing this exam.  ? ?Interpreter Needed?: No ? ?Information entered by :: Leroy Kennedy LPN ? ? ?Activities of Daily Living ? ?  02/10/2022  ? 11:13 AM  ?In your present state of health, do you have any difficulty performing the following activities:  ?Hearing? 0  ?Vision? 0  ?Difficulty concentrating or making decisions? 0  ?Walking or climbing stairs? 0  ?Dressing or bathing? 0  ?Doing errands, shopping? 0  ?Preparing Food and eating ? N  ?Using the Toilet? N  ?In the past  six months, have you accidently leaked urine? N  ?Do you have problems with loss of bowel control? N  ?Managing your Medications? N  ?Managing your Finances? N  ?Housekeeping or managing your Housekeeping? N  ? ? ?Patient Care Team: ?Venita Lick, NP as PCP - General (Nurse Practitioner) ? ?Indicate any recent Medical Services you may have received from other than [facility] providers in the past year (date may be approximate). ? ?   ?Assessment:  ? This is a routine wellness examination for Dillon [name]. ? ?Hearing/Vision screen ?Hearing Screening - Comments:: No hearing aids ?Tinnitus in r ear ? ?Vision Screening - Comments:: Up to date ?[facility] ? ? ?Dietary issues and exercise activities discussed: ?Current Exercise Habits: The patient does not participate in regular exercise at present, Exercise limited by: Other - see comments (hernia) ? ? Goals Addressed   ? ?  ?  ?  ?  ? This Visit's Progress  ?  Increase physical activity     ? ?  ? ?Depression Screen ? ?  02/10/2022  ? 11:21 AM 12/31/2021  ?  2:15 PM 11/02/2021  ?  3:14 PM 09/28/2021  ? 11:29 AM 02/08/2021  ? 11:22 AM 04/22/2020  ?  3:12 PM 04/22/2020  ?  2:31 PM  ?PHQ 2/9 Scores  ?PHQ - 2 Score 0 1 0 2 0 0 0  ?PHQ- 9 Score 0 3 1 2   0 2  ?  ?Fall Risk ? ?  02/10/2022  ? 11:07 AM 11/02/2021  ?  3:14 PM 02/08/2021  ? 11:22 AM 01/23/2020  ?  9:00 AM 10/22/2019  ?  1:04 PM  ?Fall Risk   ?Falls in the past year? 0 0 0 0 0  ?Number falls in past yr: 0 0  0 0  ?Injury with Fall? 0 0  0 0  ?Risk for fall due  to :  No Fall Risks No Fall Risks    ?Follow up Falls evaluation completed;Education provided;Falls prevention discussed Falls evaluation completed Falls evaluation completed;Education provided;Falls prevention

## 2022-02-11 ENCOUNTER — Ambulatory Visit: Payer: Medicare HMO

## 2022-04-03 NOTE — Patient Instructions (Incomplete)

## 2022-04-08 ENCOUNTER — Ambulatory Visit (INDEPENDENT_AMBULATORY_CARE_PROVIDER_SITE_OTHER): Payer: Medicare HMO | Admitting: Nurse Practitioner

## 2022-04-08 ENCOUNTER — Encounter: Payer: Self-pay | Admitting: Nurse Practitioner

## 2022-04-08 VITALS — BP 138/86 | HR 96 | Temp 98.0°F | Ht 69.0 in | Wt 212.4 lb

## 2022-04-08 DIAGNOSIS — E6609 Other obesity due to excess calories: Secondary | ICD-10-CM | POA: Diagnosis not present

## 2022-04-08 DIAGNOSIS — E1165 Type 2 diabetes mellitus with hyperglycemia: Secondary | ICD-10-CM

## 2022-04-08 DIAGNOSIS — E538 Deficiency of other specified B group vitamins: Secondary | ICD-10-CM

## 2022-04-08 DIAGNOSIS — E559 Vitamin D deficiency, unspecified: Secondary | ICD-10-CM | POA: Diagnosis not present

## 2022-04-08 DIAGNOSIS — E1129 Type 2 diabetes mellitus with other diabetic kidney complication: Secondary | ICD-10-CM

## 2022-04-08 DIAGNOSIS — E785 Hyperlipidemia, unspecified: Secondary | ICD-10-CM | POA: Diagnosis not present

## 2022-04-08 DIAGNOSIS — R809 Proteinuria, unspecified: Secondary | ICD-10-CM | POA: Diagnosis not present

## 2022-04-08 DIAGNOSIS — F325 Major depressive disorder, single episode, in full remission: Secondary | ICD-10-CM | POA: Diagnosis not present

## 2022-04-08 DIAGNOSIS — Z6830 Body mass index (BMI) 30.0-30.9, adult: Secondary | ICD-10-CM

## 2022-04-08 DIAGNOSIS — E1169 Type 2 diabetes mellitus with other specified complication: Secondary | ICD-10-CM | POA: Diagnosis not present

## 2022-04-08 LAB — BAYER DCA HB A1C WAIVED: HB A1C (BAYER DCA - WAIVED): 6.2 % — ABNORMAL HIGH (ref 4.8–5.6)

## 2022-04-08 NOTE — Assessment & Plan Note (Signed)
Noted on labs March 2023, he is taking supplement daily at this time.  Recheck on labs + CBC.

## 2022-04-09 LAB — LIPID PANEL W/O CHOL/HDL RATIO
Cholesterol, Total: 115 mg/dL (ref 100–199)
HDL: 41 mg/dL (ref >39)
LDL Chol Calc (NIH): 45 mg/dL (ref 0–99)
Triglycerides: 176 mg/dL — ABNORMAL HIGH (ref 0–149)
VLDL Cholesterol Cal: 29 mg/dL (ref 5–40)

## 2022-04-09 LAB — COMPREHENSIVE METABOLIC PANEL
ALT: 23 IU/L (ref 0–44)
AST: 16 IU/L (ref 0–40)
Albumin/Globulin Ratio: 2.6 — ABNORMAL HIGH (ref 1.2–2.2)
Albumin: 4.7 g/dL (ref 3.8–4.8)
Alkaline Phosphatase: 113 IU/L (ref 44–121)
BUN/Creatinine Ratio: 14 (ref 10–24)
BUN: 13 mg/dL (ref 8–27)
Bilirubin Total: 0.8 mg/dL (ref 0.0–1.2)
CO2: 21 mmol/L (ref 20–29)
Calcium: 10.8 mg/dL — ABNORMAL HIGH (ref 8.6–10.2)
Chloride: 106 mmol/L (ref 96–106)
Creatinine, Ser: 0.93 mg/dL (ref 0.76–1.27)
Globulin, Total: 1.8 g/dL (ref 1.5–4.5)
Glucose: 95 mg/dL (ref 70–99)
Potassium: 4.2 mmol/L (ref 3.5–5.2)
Sodium: 147 mmol/L — ABNORMAL HIGH (ref 134–144)
Total Protein: 6.5 g/dL (ref 6.0–8.5)
eGFR: 89 mL/min/{1.73_m2} (ref >59)

## 2022-04-09 LAB — CBC WITH DIFFERENTIAL/PLATELET
Basophils Absolute: 0 10*3/uL (ref 0.0–0.2)
Basos: 0 %
EOS (ABSOLUTE): 0.1 10*3/uL (ref 0.0–0.4)
Eos: 1 %
Hematocrit: 49.3 % (ref 37.5–51.0)
Hemoglobin: 17.2 g/dL (ref 13.0–17.7)
Immature Grans (Abs): 0 10*3/uL (ref 0.0–0.1)
Immature Granulocytes: 0 %
Lymphocytes Absolute: 3.2 10*3/uL — ABNORMAL HIGH (ref 0.7–3.1)
Lymphs: 33 %
MCH: 31.6 pg (ref 26.6–33.0)
MCHC: 34.9 g/dL (ref 31.5–35.7)
MCV: 91 fL (ref 79–97)
Monocytes Absolute: 0.8 10*3/uL (ref 0.1–0.9)
Monocytes: 8 %
Neutrophils Absolute: 5.7 10*3/uL (ref 1.4–7.0)
Neutrophils: 58 %
Platelets: 168 10*3/uL (ref 150–450)
RBC: 5.45 x10E6/uL (ref 4.14–5.80)
RDW: 13.2 % (ref 11.6–15.4)
WBC: 9.8 10*3/uL (ref 3.4–10.8)

## 2022-04-09 LAB — VITAMIN B12: Vitamin B-12: 337 pg/mL (ref 232–1245)

## 2022-04-09 LAB — VITAMIN D 25 HYDROXY (VIT D DEFICIENCY, FRACTURES): Vit D, 25-Hydroxy: 18.2 ng/mL — ABNORMAL LOW (ref 30.0–100.0)

## 2022-04-10 ENCOUNTER — Encounter: Payer: Self-pay | Admitting: Nurse Practitioner

## 2022-04-10 DIAGNOSIS — E870 Hyperosmolality and hypernatremia: Secondary | ICD-10-CM | POA: Insufficient documentation

## 2022-10-05 ENCOUNTER — Ambulatory Visit: Payer: Self-pay | Admitting: *Deleted

## 2022-10-05 NOTE — Telephone Encounter (Signed)
Second attempt to call patient- no answer and no voicemail set up

## 2022-10-05 NOTE — Telephone Encounter (Signed)
3rd attempt, pt called, unable to LVM d/t VM not set up.

## 2022-10-05 NOTE — Telephone Encounter (Signed)
Summary: Pt has concerns about the metFORMIN (GLUCOPHAGE) 500 MG tablet.   Pt called with concerns about the metFORMIN (GLUCOPHAGE) 500 MG tablet. Pt stated he has been experiencing gas and diarrhea. Pt stated he has to use the bathroom right after eating. Pt requests call back to discuss. Cb# [phone]          Attempted to call patient x2- no answer and voice mail not set up- unable to leave message

## 2022-10-05 NOTE — Telephone Encounter (Signed)
Called patient to schedule an appointment. Please schedule an appointment when patient calls back

## 2022-10-09 NOTE — Patient Instructions (Signed)
Diabetes Mellitus Basics  Diabetes mellitus, or diabetes, is a long-term (chronic) disease. It occurs when the body does not properly use sugar (glucose) that is released from food after you eat. Diabetes mellitus may be caused by one or both of these problems: Your pancreas does not make enough of a hormone called insulin. Your body does not react in a normal way to the insulin that it makes. Insulin lets glucose enter cells in your body. This gives you energy. If you have diabetes, glucose cannot get into cells. This causes high blood glucose (hyperglycemia). How to treat and manage diabetes You may need to take insulin or other diabetes medicines daily to keep your glucose in balance. If you are prescribed insulin, you will learn how to give yourself insulin by injection. You may need to adjust the amount of insulin you take based on the foods that you eat. You will need to check your blood glucose levels using a glucose monitor as told by your health care provider. The readings can help determine if you have low or high blood glucose. Generally, you should have these blood glucose levels: Before meals (preprandial): 80-130 mg/dL (4.4-7.2 mmol/L). After meals (postprandial): below 180 mg/dL (10 mmol/L). Hemoglobin A1c (HbA1c) level: less than 7%. Your health care provider will set treatment goals for you. Keep all follow-up visits. This is important. Follow these instructions at home: Diabetes medicines Take your diabetes medicines every day as told by your health care provider. List your diabetes medicines here: Name of medicine: ______________________________ Amount (dose): _______________ Time (a.m./p.m.): _______________ Notes: ___________________________________ Name of medicine: ______________________________ Amount (dose): _______________ Time (a.m./p.m.): _______________ Notes: ___________________________________ Name of medicine: ______________________________ Amount (dose):  _______________ Time (a.m./p.m.): _______________ Notes: ___________________________________ Insulin If you use insulin, list the types of insulin you use here: Insulin type: ______________________________ Amount (dose): _______________ Time (a.m./p.m.): _______________Notes: ___________________________________ Insulin type: ______________________________ Amount (dose): _______________ Time (a.m./p.m.): _______________ Notes: ___________________________________ Insulin type: ______________________________ Amount (dose): _______________ Time (a.m./p.m.): _______________ Notes: ___________________________________ Insulin type: ______________________________ Amount (dose): _______________ Time (a.m./p.m.): _______________ Notes: ___________________________________ Insulin type: ______________________________ Amount (dose): _______________ Time (a.m./p.m.): _______________ Notes: ___________________________________ Managing blood glucose  Check your blood glucose levels using a glucose monitor as told by your health care provider. Write down the times that you check your glucose levels here: Time: _______________ Notes: ___________________________________ Time: _______________ Notes: ___________________________________ Time: _______________ Notes: ___________________________________ Time: _______________ Notes: ___________________________________ Time: _______________ Notes: ___________________________________ Time: _______________ Notes: ___________________________________  Low blood glucose Low blood glucose (hypoglycemia) is when glucose is at or below 70 mg/dL (3.9 mmol/L). Symptoms may include: Feeling: Hungry. Sweaty and clammy. Irritable or easily upset. Dizzy. Sleepy. Having: A fast heartbeat. A headache. A change in your vision. Numbness around the mouth, lips, or tongue. Having trouble with: Moving (coordination). Sleeping. Treating low blood glucose To treat low blood  glucose, eat or drink something containing sugar right away. If you can think clearly and swallow safely, follow the 15:15 rule: Take 15 grams of a fast-acting carb (carbohydrate), as told by your health care provider. Some fast-acting carbs are: Glucose tablets: take 3-4 tablets. Hard candy: eat 3-5 pieces. Fruit juice: drink 4 oz (120 mL). Regular (not diet) soda: drink 4-6 oz (120-180 mL). Honey or sugar: eat 1 Tbsp (15 mL). Check your blood glucose levels 15 minutes after you take the carb. If your glucose is still at or below 70 mg/dL (3.9 mmol/L), take 15 grams of a carb again. If your glucose does not go above 70 mg/dL (3.9 mmol/L) after   3 tries, get help right away. After your glucose goes back to normal, eat a meal or a snack within 1 hour. Treating very low blood glucose If your glucose is at or below 54 mg/dL (3 mmol/L), you have very low blood glucose (severe hypoglycemia). This is an emergency. Do not wait to see if the symptoms will go away. Get medical help right away. Call your local emergency services (911 in the [place] Do not drive yourself to the hospital. Questions to ask your health care provider Should I talk with a diabetes educator? What equipment will I need to care for myself at home? What diabetes medicines do I need? When should I take them? How often do I need to check my blood glucose levels? What number can I call if I have questions? When is my follow-up visit? Where can I find a support group for people with diabetes? Where to find more information American Diabetes Association: www.diabetes.org Association of Diabetes Care and Education Specialists: www.diabeteseducator.org Contact a health care provider if: Your blood glucose is at or above 240 mg/dL (13.3 mmol/L) for 2 days in a row. You have been sick or have had a fever for 2 days or more, and you are not getting better. You have any of these problems for more than 6 hours: You cannot eat or  drink. You feel nauseous. You vomit. You have diarrhea. Get help right away if: Your blood glucose is lower than 54 mg/dL (3 mmol/L). You get confused. You have trouble thinking clearly. You have trouble breathing. These symptoms may represent a serious problem that is an emergency. Do not wait to see if the symptoms will go away. Get medical help right away. Call your local emergency services (911 in the [place] Do not drive yourself to the hospital. Summary Diabetes mellitus is a chronic disease that occurs when the body does not properly use sugar (glucose) that is released from food after you eat. Take insulin and diabetes medicines as told. Check your blood glucose every day, as often as told. Keep all follow-up visits. This is important. This information is not intended to replace advice given to you by your health care provider. Make sure you discuss any questions you have with your health care provider. Document Revised: 02/04/2020 Document Reviewed: 02/04/2020 Elsevier Patient Education  2023 Elsevier Inc.  

## 2022-10-11 ENCOUNTER — Encounter: Payer: Self-pay | Admitting: Nurse Practitioner

## 2022-10-11 ENCOUNTER — Emergency Department: Payer: Medicare HMO

## 2022-10-11 ENCOUNTER — Ambulatory Visit (INDEPENDENT_AMBULATORY_CARE_PROVIDER_SITE_OTHER): Payer: Medicare HMO | Admitting: Nurse Practitioner

## 2022-10-11 ENCOUNTER — Observation Stay
Admission: EM | Admit: 2022-10-11 | Discharge: 2022-10-19 | Payer: Medicare HMO | Attending: Obstetrics and Gynecology | Admitting: Obstetrics and Gynecology

## 2022-10-11 VITALS — BP 147/97 | HR 88 | Temp 97.9°F | Ht 69.02 in | Wt 188.2 lb

## 2022-10-11 DIAGNOSIS — E785 Hyperlipidemia, unspecified: Secondary | ICD-10-CM

## 2022-10-11 DIAGNOSIS — N138 Other obstructive and reflux uropathy: Secondary | ICD-10-CM

## 2022-10-11 DIAGNOSIS — R638 Other symptoms and signs concerning food and fluid intake: Secondary | ICD-10-CM

## 2022-10-11 DIAGNOSIS — E43 Unspecified severe protein-calorie malnutrition: Secondary | ICD-10-CM | POA: Diagnosis not present

## 2022-10-11 DIAGNOSIS — E119 Type 2 diabetes mellitus without complications: Secondary | ICD-10-CM | POA: Diagnosis not present

## 2022-10-11 DIAGNOSIS — E1169 Type 2 diabetes mellitus with other specified complication: Secondary | ICD-10-CM | POA: Diagnosis not present

## 2022-10-11 DIAGNOSIS — F411 Generalized anxiety disorder: Secondary | ICD-10-CM | POA: Diagnosis present

## 2022-10-11 DIAGNOSIS — F332 Major depressive disorder, recurrent severe without psychotic features: Secondary | ICD-10-CM | POA: Diagnosis present

## 2022-10-11 DIAGNOSIS — R630 Anorexia: Secondary | ICD-10-CM | POA: Insufficient documentation

## 2022-10-11 DIAGNOSIS — N401 Enlarged prostate with lower urinary tract symptoms: Secondary | ICD-10-CM

## 2022-10-11 DIAGNOSIS — N132 Hydronephrosis with renal and ureteral calculous obstruction: Secondary | ICD-10-CM | POA: Diagnosis not present

## 2022-10-11 DIAGNOSIS — N1330 Unspecified hydronephrosis: Secondary | ICD-10-CM | POA: Diagnosis present

## 2022-10-11 DIAGNOSIS — N200 Calculus of kidney: Secondary | ICD-10-CM | POA: Diagnosis not present

## 2022-10-11 DIAGNOSIS — E559 Vitamin D deficiency, unspecified: Secondary | ICD-10-CM | POA: Diagnosis not present

## 2022-10-11 DIAGNOSIS — E6609 Other obesity due to excess calories: Secondary | ICD-10-CM

## 2022-10-11 DIAGNOSIS — Z79899 Other long term (current) drug therapy: Secondary | ICD-10-CM | POA: Insufficient documentation

## 2022-10-11 DIAGNOSIS — F422 Mixed obsessional thoughts and acts: Secondary | ICD-10-CM

## 2022-10-11 DIAGNOSIS — Z1152 Encounter for screening for COVID-19: Secondary | ICD-10-CM | POA: Diagnosis not present

## 2022-10-11 DIAGNOSIS — F325 Major depressive disorder, single episode, in full remission: Secondary | ICD-10-CM

## 2022-10-11 DIAGNOSIS — R824 Acetonuria: Secondary | ICD-10-CM | POA: Insufficient documentation

## 2022-10-11 DIAGNOSIS — E1129 Type 2 diabetes mellitus with other diabetic kidney complication: Secondary | ICD-10-CM | POA: Diagnosis not present

## 2022-10-11 DIAGNOSIS — K8020 Calculus of gallbladder without cholecystitis without obstruction: Secondary | ICD-10-CM | POA: Diagnosis not present

## 2022-10-11 DIAGNOSIS — E870 Hyperosmolality and hypernatremia: Secondary | ICD-10-CM

## 2022-10-11 DIAGNOSIS — N10 Acute tubulo-interstitial nephritis: Secondary | ICD-10-CM | POA: Diagnosis not present

## 2022-10-11 DIAGNOSIS — D696 Thrombocytopenia, unspecified: Secondary | ICD-10-CM | POA: Diagnosis not present

## 2022-10-11 DIAGNOSIS — E1165 Type 2 diabetes mellitus with hyperglycemia: Secondary | ICD-10-CM

## 2022-10-11 DIAGNOSIS — E876 Hypokalemia: Secondary | ICD-10-CM | POA: Diagnosis present

## 2022-10-11 DIAGNOSIS — Z6830 Body mass index (BMI) 30.0-30.9, adult: Secondary | ICD-10-CM

## 2022-10-11 DIAGNOSIS — F429 Obsessive-compulsive disorder, unspecified: Secondary | ICD-10-CM | POA: Diagnosis present

## 2022-10-11 DIAGNOSIS — E538 Deficiency of other specified B group vitamins: Secondary | ICD-10-CM | POA: Diagnosis not present

## 2022-10-11 DIAGNOSIS — R000 Tachycardia, unspecified: Secondary | ICD-10-CM | POA: Diagnosis not present

## 2022-10-11 DIAGNOSIS — R4182 Altered mental status, unspecified: Secondary | ICD-10-CM | POA: Diagnosis not present

## 2022-10-11 DIAGNOSIS — N3001 Acute cystitis with hematuria: Secondary | ICD-10-CM | POA: Diagnosis not present

## 2022-10-11 DIAGNOSIS — R809 Proteinuria, unspecified: Secondary | ICD-10-CM

## 2022-10-11 LAB — COMPREHENSIVE METABOLIC PANEL
ALT: 60 U/L — ABNORMAL HIGH (ref 0–44)
AST: 37 U/L (ref 15–41)
Albumin: 4.8 g/dL (ref 3.5–5.0)
Alkaline Phosphatase: 106 U/L (ref 38–126)
Anion gap: 11 (ref 5–15)
BUN: 18 mg/dL (ref 8–23)
CO2: 27 mmol/L (ref 22–32)
Calcium: 10.4 mg/dL — ABNORMAL HIGH (ref 8.9–10.3)
Chloride: 102 mmol/L (ref 98–111)
Creatinine, Ser: 1.02 mg/dL (ref 0.61–1.24)
GFR, Estimated: >60 mL/min (ref >60)
Glucose, Bld: 178 mg/dL — ABNORMAL HIGH (ref 70–99)
Potassium: 3.1 mmol/L — ABNORMAL LOW (ref 3.5–5.1)
Sodium: 140 mmol/L (ref 135–145)
Total Bilirubin: 2.3 mg/dL — ABNORMAL HIGH (ref 0.3–1.2)
Total Protein: 7.3 g/dL (ref 6.5–8.1)

## 2022-10-11 LAB — PROCALCITONIN: Procalcitonin: <0.1 ng/mL

## 2022-10-11 LAB — LIPASE, BLOOD: Lipase: 33 U/L (ref 11–51)

## 2022-10-11 LAB — URINALYSIS, ROUTINE W REFLEX MICROSCOPIC
Bacteria, UA: NONE SEEN
Bilirubin Urine: NEGATIVE
Glucose, UA: NEGATIVE mg/dL
Ketones, ur: 80 mg/dL — AB
Nitrite: NEGATIVE
Protein, ur: 100 mg/dL — AB
Specific Gravity, Urine: 1.025 (ref 1.005–1.030)
pH: 5 (ref 5.0–8.0)

## 2022-10-11 LAB — CBC WITH DIFFERENTIAL/PLATELET
Abs Immature Granulocytes: 0.03 10*3/uL (ref 0.00–0.07)
Basophils Absolute: 0 10*3/uL (ref 0.0–0.1)
Basophils Relative: 0 %
Eosinophils Absolute: 0 10*3/uL (ref 0.0–0.5)
Eosinophils Relative: 0 %
HCT: 52.4 % — ABNORMAL HIGH (ref 39.0–52.0)
Hemoglobin: 18.1 g/dL — ABNORMAL HIGH (ref 13.0–17.0)
Immature Granulocytes: 0 %
Lymphocytes Relative: 25 %
Lymphs Abs: 2.5 10*3/uL (ref 0.7–4.0)
MCH: 31 pg (ref 26.0–34.0)
MCHC: 34.5 g/dL (ref 30.0–36.0)
MCV: 89.7 fL (ref 80.0–100.0)
Monocytes Absolute: 0.9 10*3/uL (ref 0.1–1.0)
Monocytes Relative: 9 %
Neutro Abs: 6.6 10*3/uL (ref 1.7–7.7)
Neutrophils Relative %: 66 %
Platelets: 182 10*3/uL (ref 150–400)
RBC: 5.84 MIL/uL — ABNORMAL HIGH (ref 4.22–5.81)
RDW: 13.1 % (ref 11.5–15.5)
WBC: 10 10*3/uL (ref 4.0–10.5)
nRBC: 0 % (ref 0.0–0.2)

## 2022-10-11 LAB — RESP PANEL BY RT-PCR (RSV, FLU A&B, COVID)  RVPGX2
Influenza A by PCR: NEGATIVE
Influenza B by PCR: NEGATIVE
Resp Syncytial Virus by PCR: NEGATIVE
SARS Coronavirus 2 by RT PCR: NEGATIVE

## 2022-10-11 LAB — BETA-HYDROXYBUTYRIC ACID: Beta-Hydroxybutyric Acid: 1.89 mmol/L — ABNORMAL HIGH (ref 0.05–0.27)

## 2022-10-11 LAB — CBG MONITORING, ED
Glucose-Capillary: 146 mg/dL — ABNORMAL HIGH (ref 70–99)
Glucose-Capillary: 133 mg/dL — ABNORMAL HIGH (ref 70–99)

## 2022-10-11 LAB — TSH: TSH: 1.223 u[IU]/mL (ref 0.350–4.500)

## 2022-10-11 LAB — LACTIC ACID, PLASMA: Lactic Acid, Venous: 2.5 mmol/L (ref 0.5–1.9)

## 2022-10-11 LAB — BAYER DCA HB A1C WAIVED: HB A1C (BAYER DCA - WAIVED): 6.4 % — ABNORMAL HIGH (ref 4.8–5.6)

## 2022-10-11 MED ORDER — LISINOPRIL 10 MG PO TABS
5.0000 mg | ORAL_TABLET | Freq: Every day | ORAL | Status: DC
  Administered 2022-10-11 – 2022-10-13 (×3): 5 mg via ORAL
  Filled 2022-10-11 (×3): qty 1

## 2022-10-11 MED ORDER — POTASSIUM CHLORIDE CRYS ER 20 MEQ PO TBCR
40.0000 meq | EXTENDED_RELEASE_TABLET | Freq: Once | ORAL | Status: AC
  Administered 2022-10-11: 40 meq via ORAL
  Filled 2022-10-11: qty 2

## 2022-10-11 MED ORDER — LISINOPRIL 5 MG PO TABS: 5.0000 mg | ORAL_TABLET | Freq: Every day | ORAL | 4 refills | Status: DC

## 2022-10-11 MED ORDER — FLUVOXAMINE MALEATE 25 MG PO TABS: 25.0000 mg | ORAL_TABLET | Freq: Every day | ORAL | 3 refills | Status: DC

## 2022-10-11 MED ORDER — SODIUM CHLORIDE 0.9 % IV BOLUS
1000.0000 mL | Freq: Once | INTRAVENOUS | Status: AC
  Administered 2022-10-11: 1000 mL via INTRAVENOUS

## 2022-10-11 MED ORDER — VITAMIN D (ERGOCALCIFEROL) 1.25 MG (50000 UNIT) PO CAPS
50000.0000 [IU] | ORAL_CAPSULE | ORAL | Status: DC
  Administered 2022-10-14: 50000 [IU] via ORAL
  Filled 2022-10-11: qty 1

## 2022-10-11 MED ORDER — ACETAMINOPHEN 325 MG PO TABS
650.0000 mg | ORAL_TABLET | Freq: Four times a day (QID) | ORAL | Status: DC | PRN
  Administered 2022-10-16: 650 mg via ORAL
  Filled 2022-10-11: qty 2

## 2022-10-11 MED ORDER — ACETAMINOPHEN 650 MG RE SUPP: 650.0000 mg | Freq: Four times a day (QID) | RECTAL | Status: DC | PRN

## 2022-10-11 MED ORDER — SODIUM CHLORIDE 0.9 % IV SOLN
2.0000 g | INTRAVENOUS | Status: DC
  Administered 2022-10-12 – 2022-10-13 (×2): 2 g via INTRAVENOUS
  Filled 2022-10-11: qty 20
  Filled 2022-10-11 (×2): qty 2

## 2022-10-11 MED ORDER — VITAMIN D (ERGOCALCIFEROL) 1.25 MG (50000 UNIT) PO CAPS: 50000.0000 [IU] | ORAL_CAPSULE | ORAL | 0 refills | Status: DC

## 2022-10-11 MED ORDER — VITAMIN B-12 1000 MCG PO TABS: 1000.0000 ug | ORAL_TABLET | Freq: Every day | ORAL | 4 refills | Status: DC

## 2022-10-11 MED ORDER — LINAGLIPTIN 5 MG PO TABS
5.0000 mg | ORAL_TABLET | Freq: Every day | ORAL | Status: DC
  Administered 2022-10-11: 5 mg via ORAL
  Filled 2022-10-11 (×2): qty 1

## 2022-10-11 MED ORDER — SODIUM CHLORIDE 0.9 % IV SOLN
2.0000 g | Freq: Once | INTRAVENOUS | Status: AC
  Administered 2022-10-11: 2 g via INTRAVENOUS
  Filled 2022-10-11: qty 20

## 2022-10-11 MED ORDER — ACETAMINOPHEN 325 MG PO TABS: 650.0000 mg | ORAL_TABLET | ORAL | Status: DC | PRN

## 2022-10-11 MED ORDER — LACTATED RINGERS IV SOLN: INTRAVENOUS | Status: DC

## 2022-10-11 MED ORDER — ONDANSETRON HCL 4 MG PO TABS: 4.0000 mg | ORAL_TABLET | Freq: Four times a day (QID) | ORAL | Status: DC | PRN

## 2022-10-11 MED ORDER — VITAMIN B-12 1000 MCG PO TABS
1000.0000 ug | ORAL_TABLET | Freq: Every day | ORAL | Status: DC
  Administered 2022-10-11 – 2022-10-18 (×7): 1000 ug via ORAL
  Filled 2022-10-11 (×3): qty 1
  Filled 2022-10-11: qty 2
  Filled 2022-10-11 (×3): qty 1

## 2022-10-11 MED ORDER — HYDROCODONE-ACETAMINOPHEN 5-325 MG PO TABS
1.0000 | ORAL_TABLET | ORAL | Status: DC | PRN
  Filled 2022-10-11: qty 2

## 2022-10-11 MED ORDER — ONDANSETRON HCL 4 MG/2ML IJ SOLN: 4.0000 mg | Freq: Four times a day (QID) | INTRAMUSCULAR | Status: DC | PRN

## 2022-10-11 MED ORDER — ROSUVASTATIN CALCIUM 10 MG PO TABS
10.0000 mg | ORAL_TABLET | Freq: Every day | ORAL | Status: DC
  Administered 2022-10-11 – 2022-10-18 (×7): 10 mg via ORAL
  Filled 2022-10-11 (×8): qty 1

## 2022-10-11 MED ORDER — SITAGLIPTIN PHOSPHATE 100 MG PO TABS: 100.0000 mg | ORAL_TABLET | Freq: Every day | ORAL | 3 refills | Status: DC

## 2022-10-11 MED ORDER — ROSUVASTATIN CALCIUM 10 MG PO TABS: 10.0000 mg | ORAL_TABLET | Freq: Every day | ORAL | 4 refills | Status: DC

## 2022-10-11 MED ORDER — MORPHINE SULFATE (PF) 2 MG/ML IV SOLN
2.0000 mg | INTRAVENOUS | Status: DC | PRN
  Administered 2022-10-12: 2 mg via INTRAVENOUS
  Filled 2022-10-11: qty 1

## 2022-10-11 MED ORDER — ALUM & MAG HYDROXIDE-SIMETH 200-200-20 MG/5ML PO SUSP: 30.0000 mL | Freq: Four times a day (QID) | ORAL | Status: DC | PRN

## 2022-10-11 MED ORDER — IOHEXOL 300 MG/ML  SOLN
100.0000 mL | Freq: Once | INTRAMUSCULAR | Status: AC | PRN
  Administered 2022-10-11: 100 mL via INTRAVENOUS

## 2022-10-11 MED ORDER — FLUVOXAMINE MALEATE 50 MG PO TABS
25.0000 mg | ORAL_TABLET | Freq: Every day | ORAL | Status: DC
  Administered 2022-10-12: 25 mg via ORAL
  Filled 2022-10-11 (×2): qty 1

## 2022-10-11 NOTE — ED Notes (Signed)
Pt's resp reg/unlabored, skin dry and calmly sitting on bed. Given warm blanket as requested.

## 2022-10-11 NOTE — ED Notes (Signed)
Pt up to restroom attempting to provide urine sample. Imaging staff at bedside.

## 2022-10-11 NOTE — ED Provider Notes (Addendum)
[facility] Provider Note    Event Date/Time   First MD Initiated Contact with Patient 10/11/22 1511     (approximate)   History   Psychiatric Evaluation (Pt. To ED from [facility] for low appetite, anxiety, and depression. Pt. Lives by himself in home. Pt. States he is not eating because he has lack of appetite and is afraid of diarrhea.)   HPI  Dillon [name] is a 71 y.o. male here with low appetite, anxiety, depression.  The patient has a previous history of depression and anxiety.  He has undergone ECT in the past.  Per report, patient has had decreasing appetite and has essentially refused to eat or drink for the last several months.  He has lost over 20 pounds.  He states that he has difficulty even getting out of his house and has become so anxious that he is unable to drive.  He states he "will not eat" even if you give him what he wants.  Denies any overt SI but does have general anhedonia.  States he has had symptoms this severe in the past and he did respond to ECT.  He went to see his PCP who sent him here for further evaluation due to his decompensation from baseline.     Physical Exam   Triage Vital Signs: ED Triage Vitals  Enc Vitals Group     BP 10/11/22 1152 (!) 160/111     Pulse Rate 10/11/22 1152 (!) 116     Resp 10/11/22 1152 18     Temp 10/11/22 1152 97.7 F (36.5 C)     Temp Source 10/11/22 1152 Oral     SpO2 10/11/22 1152 99 %     Weight --      Height --      Head Circumference --      Peak Flow --      Pain Score 10/11/22 1153 0     Pain Loc --      Pain Edu? --      Excl. in GC? --     Most recent vital signs: Vitals:   10/11/22 1826 10/11/22 2009  BP: (!) 156/97 (!) 155/99  Pulse: 94 94  Resp: 18 16  Temp:  97.9 F (36.6 C)  SpO2: 98% 97%     General: Awake, no distress.  CV:  Good peripheral perfusion.  Regular rate and rhythm. Resp:  Normal effort.  Abd:  No distention.  Other:  Withdrawn, anxious.  Appears  somewhat paranoid at times.  Denies overt SI or HI.   ED Results / Procedures / Treatments   Labs (all labs ordered are listed, but only abnormal results are displayed) Labs Reviewed  CBC WITH DIFFERENTIAL/PLATELET - Abnormal; Notable for the following components:      Result Value   RBC 5.84 (*)    Hemoglobin 18.1 (*)    HCT 52.4 (*)    All other components within normal limits  COMPREHENSIVE METABOLIC PANEL - Abnormal; Notable for the following components:   Potassium 3.1 (*)    Glucose, Bld 178 (*)    Calcium 10.4 (*)    ALT 60 (*)    Total Bilirubin 2.3 (*)    All other components within normal limits  URINALYSIS, ROUTINE W REFLEX MICROSCOPIC - Abnormal; Notable for the following components:   Color, Urine AMBER (*)    APPearance HAZY (*)    Hgb urine dipstick SMALL (*)    Ketones, ur 80 (*)  Protein, ur 100 (*)    Leukocytes,Ua MODERATE (*)    All other components within normal limits  LACTIC ACID, PLASMA - Abnormal; Notable for the following components:   Lactic Acid, Venous 2.5 (*)    All other components within normal limits  RESP PANEL BY RT-PCR (RSV, FLU A&B, COVID)  [id number]  URINE CULTURE  LIPASE, BLOOD  TSH  LACTIC ACID, PLASMA  CBG MONITORING, ED  CBG MONITORING, ED  CBG MONITORING, ED     EKG Sinus tachycardia, trickle rate 114.  QRS 114, QTc read at 631 but likely under 500 based on my review.  No acute ST elevations.   RADIOLOGY CT head: No acute intracranial abnormality   I also independently reviewed and agree with radiologist interpretations.   PROCEDURES:  Critical Care performed: No   MEDICATIONS ORDERED IN ED: Medications  cyanocobalamin (VITAMIN B12) tablet 1,000 mcg (1,000 mcg Oral Given 10/11/22 1834)  fluvoxaMINE (LUVOX) tablet 25 mg (has no administration in time range)  lisinopril (ZESTRIL) tablet 5 mg (5 mg Oral Given 10/11/22 1834)  rosuvastatin (CRESTOR) tablet 10 mg (has no administration in time range)  linagliptin  (TRADJENTA) tablet 5 mg (has no administration in time range)  Vitamin D (Ergocalciferol) (DRISDOL) 1.25 MG (50000 UNIT) capsule 50,000 Units (has no administration in time range)  acetaminophen (TYLENOL) tablet 650 mg (has no administration in time range)  alum & mag hydroxide-simeth (MAALOX/MYLANTA) 200-200-20 MG/5ML suspension 30 mL (has no administration in time range)  cefTRIAXone (ROCEPHIN) 2 g in sodium chloride 0.9 % 100 mL IVPB (has no administration in time range)  sodium chloride 0.9 % bolus 1,000 mL (has no administration in time range)  potassium chloride SA (KLOR-CON M) CR tablet 40 mEq (40 mEq Oral Given 10/11/22 1649)  sodium chloride 0.9 % bolus 1,000 mL (0 mLs Intravenous Stopped 10/11/22 1946)  iohexol (OMNIPAQUE) 300 MG/ML solution 100 mL (100 mLs Intravenous Contrast Given 10/11/22 1837)     IMPRESSION / MDM / ASSESSMENT AND PLAN / ED COURSE  I reviewed the triage vital signs and the nursing notes.                              Differential diagnosis includes, but is not limited to, worsening depression, anxiety, general medical condition, hypothyroidism  Patient's presentation is most consistent with acute presentation with potential threat to life or bodily function.  The patient is on the cardiac monitor to evaluate for evidence of arrhythmia and/or significant heart rate changes.  71 year old male with past medical history of depression here with worsening depression and anhedonia.  Patient has essentially stopped eating and drinking.  Patient symptoms do seem consistent with severe depression now significantly affecting his ability to function day-to-day.  CT head is negative.  No apparent organic etiology.  CBC without significant leukocytosis.  He does appear mildly dehydrated.  CMP shows mild hypercalcemia.  IV fluids given.  Suspect is due to poor p.o. intake.  He has a history of similar values in the past I do not suspect this contributed to his mental status  change.  TSH has been sent.  Will plan to consult psychiatry.  Patient medically stable for psychiatric disposition.  He is voluntary at this time.  Addendum: UA showsp ossible UTI with ca oxalate crystals. LA elevated. CT abd/pelvis obtained and shows renal staghorn calculus w/ some stranding. Discussed with Dr. Lonna Cobb. Will place on empiric ABX, admit. LIkely will still  need psych stabilization but perhaps chronic infection is contributing to his sx.     FINAL CLINICAL IMPRESSION(S) / ED DIAGNOSES   Final diagnoses:  Severe episode of recurrent major depressive disorder, without psychotic features (HCC)  Staghorn calculus  Acute cystitis with hematuria     Rx / DC Orders   ED Discharge Orders     None        Note:  This document was prepared using Dragon voice recognition software and may include unintentional dictation errors.   Shaune Pollack, MD 10/11/22 1740    Shaune Pollack, MD 10/11/22 2024

## 2022-10-11 NOTE — ED Notes (Signed)
Requested that NT open garage door to put pt on monitor. NT states will do so soon.

## 2022-10-11 NOTE — Assessment & Plan Note (Signed)
Diagnosed December 2022 -- A1c 9.9% at the time -- A1c today is 6.4%, remaining stable although not taking Metformin consistently due to diarrhea.  We discussed at length stopping this and start Januvia 100 MG daily, will send this in.  Educated on medication.  Could consider GLP1 or SGLT2 in future, however unsure he will perform weekly injection and concern for increased urination with SGLT2.  Recommend he continue Lisinopril for proteinuria.  BS at home is trending down.  Continue to check BS at home daily and document.  Focus on diabetic diet.

## 2022-10-11 NOTE — ED Notes (Signed)
EDP Isaacs notified in person of critical lactic 2.5.

## 2022-10-11 NOTE — Consult Note (Addendum)
[facility] Consult   Reason for Consult:  poor  appetite, anxiety, depression, wt loss Referring Physician:  Ellender Hose Patient Identification: Dillon [name] MRN:  [id number] Principal Diagnosis: Anxiety state Diagnosis:  Principal Problem:   Anxiety state Active Problems:   OCD (obsessive compulsive disorder)   Major depressive disorder, recurrent episode, severe (HCC)   Total Time spent with patient: 30 minutes  Subjective:  "I have a fear of eating or drinking because I might have diarrhea." Dillon [name] is a 71 y.o. male patient admitted with anxiety, fear of eating because he may have diarrhea.  HPI:  Patients presents to the ED from his outpatient providers office. His provider recommends that he be hospitalized for stabilization.   On evaluation, patient is pleasant, visibly anxious. He states that he knows it is not necessarily rational, but he just cannot eat for fear of having diarrhea. When asked if this has happened, he reports that he did have diarrhea one time after eating about 2 weeks ago. He reports that he has become weaker, but even when his son tells him "just eat" he can't do it. Denies frank suicidal thoughts but is vague about whether or not he feels he has things to look forward to in life. He reports he does want to live for his children and grandchildren. Denies homicidal thoughts. Denies visual hallucinations; when asked about auditory hallucinations he refers to "voice in my head that says I will have diarrhea if eat or drink.  Patient states that he was hospitalized for depression in 12/14/2015 after his wife passed away. Chart review reveals hosptitalizations in December 14, 2015 and 13-Dec-2016 with similar presentations of OCD and weight loss. Had ECT treatment    Past Psychiatric History: OCD; major depression; suicidal ideation; ECT treatment. Hospitalized in 12/14/2015 & 12-13-2016  Risk to Self:   Risk to Others:   Prior Inpatient Therapy:   Prior Outpatient  Therapy:    Past Medical History:  Past Medical History:  Diagnosis Date   Kidney stone    Meningitis    spinal   OCD (obsessive compulsive disorder)    Shingles     Past Surgical History:  Procedure Laterality Date   KIDNEY STONE SURGERY     Family History:  Family History  Problem Relation Age of Onset   Cancer Mother        bladder   Emphysema Father    Emphysema Sister    COPD Sister    Cancer Brother        prostate   Family Psychiatric  History:  Social History:  Social History   Substance and Sexual Activity  Alcohol Use No   Alcohol/week: 0.0 standard drinks of alcohol     Social History   Substance and Sexual Activity  Drug Use No    Social History   Socioeconomic History   Marital status: Widowed    Spouse name: Not on file   Number of children: Not on file   Years of education: Not on file   Highest education level: Not on file  Occupational History   Occupation: retired  Tobacco Use   Smoking status: Never   Smokeless tobacco: Never  Vaping Use   Vaping Use: Never used  Substance and Sexual Activity   Alcohol use: No    Alcohol/week: 0.0 standard drinks of alcohol   Drug use: No   Sexual activity: Not Currently  Other Topics Concern   Not on file  Social History Narrative  Not on file   Social Determinants of Health   Financial Resource Strain: Low Risk  (02/10/2022)   Overall Financial Resource Strain (CARDIA)    Difficulty of Paying Living Expenses: Not hard at all  Food Insecurity: No Food Insecurity (02/10/2022)   Hunger Vital Sign    Worried About Running Out of Food in the Last Year: Never true    Ran Out of Food in the Last Year: Never true  Transportation Needs: No Transportation Needs (02/10/2022)   PRAPARE - Hydrologist (Medical): No    Lack of Transportation (Non-Medical): No  Physical Activity: Inactive (02/10/2022)   Exercise Vital Sign    Days of Exercise per Week: 0 days    Minutes of  Exercise per Session: 0 min  Stress: No Stress Concern Present (02/10/2022)   [place]    Feeling of Stress : Not at all  Social Connections: Moderately Integrated (02/10/2022)   Social Connection and Isolation Panel [NHANES]    Frequency of Communication with Friends and Family: Twice a week    Frequency of Social Gatherings with Friends and Family: Twice a week    Attends Religious Services: 1 to 4 times per year    Active Member of [employer] or Organizations: No    Attends Music therapist: More than 4 times per year    Marital Status: Widowed   Additional Social History:    Allergies:   Allergies  Allergen Reactions   Codeine Other (See Comments)    Unsure of reaction    Labs:  Results for orders placed or performed during the hospital encounter of 10/11/22 (from the past 48 hour(s))  CBC with Differential     Status: Abnormal   Collection Time: 10/11/22 11:53 AM  Result Value Ref Range   WBC 10.0 4.0 - 10.5 K/uL   RBC 5.84 (H) 4.22 - 5.81 MIL/uL   Hemoglobin 18.1 (H) 13.0 - 17.0 g/dL   HCT 52.4 (H) 39.0 - 52.0 %   MCV 89.7 80.0 - 100.0 fL   MCH 31.0 26.0 - 34.0 pg   MCHC 34.5 30.0 - 36.0 g/dL   RDW 13.1 11.5 - 15.5 %   Platelets 182 150 - 400 K/uL   nRBC 0.0 0.0 - 0.2 %   Neutrophils Relative % 66 %   Neutro Abs 6.6 1.7 - 7.7 K/uL   Lymphocytes Relative 25 %   Lymphs Abs 2.5 0.7 - 4.0 K/uL   Monocytes Relative 9 %   Monocytes Absolute 0.9 0.1 - 1.0 K/uL   Eosinophils Relative 0 %   Eosinophils Absolute 0.0 0.0 - 0.5 K/uL   Basophils Relative 0 %   Basophils Absolute 0.0 0.0 - 0.1 K/uL   Immature Granulocytes 0 %   Abs Immature Granulocytes 0.03 0.00 - 0.07 K/uL    Comment: Performed at [facility], [address] [place], [place] [address]  Comprehensive metabolic panel     Status: Abnormal   Collection Time: 10/11/22 11:53 AM  Result Value Ref Range   Sodium 140 135 - 145  mmol/L   Potassium 3.1 (L) 3.5 - 5.1 mmol/L   Chloride 102 98 - 111 mmol/L   CO2 27 22 - 32 mmol/L   Glucose, Bld 178 (H) 70 - 99 mg/dL    Comment: Glucose reference range applies only to samples taken after fasting for at least 8 hours.   BUN 18 8 -  23 mg/dL   Creatinine, Ser 1.02 0.61 - 1.24 mg/dL   Calcium 10.4 (H) 8.9 - 10.3 mg/dL   Total Protein 7.3 6.5 - 8.1 g/dL   Albumin 4.8 3.5 - 5.0 g/dL   AST 37 15 - 41 U/L   ALT 60 (H) 0 - 44 U/L   Alkaline Phosphatase 106 38 - 126 U/L   Total Bilirubin 2.3 (H) 0.3 - 1.2 mg/dL   GFR, Estimated >60 >60 mL/min    Comment: (NOTE) Calculated using the CKD-EPI Creatinine Equation (2021)    Anion gap 11 5 - 15    Comment: Performed at [facility], [address] [place], [place] [address]  Lipase, blood     Status: None   Collection Time: 10/11/22 11:53 AM  Result Value Ref Range   Lipase 33 11 - 51 U/L    Comment: Performed at [facility], [address] [place], [place] [address]  Resp panel by RT-PCR (RSV, Flu A&B, Covid) Anterior Nasal Swab     Status: None   Collection Time: 10/11/22  4:48 PM   Specimen: Anterior Nasal Swab  Result Value Ref Range   SARS Coronavirus 2 by RT PCR NEGATIVE NEGATIVE    Comment: (NOTE) SARS-CoV-2 target nucleic acids are NOT DETECTED.  The SARS-CoV-2 RNA is generally detectable in upper respiratory specimens during the acute phase of infection. The lowest concentration of SARS-CoV-2 viral copies this assay can detect is 138 copies/mL. A negative result does not preclude SARS-Cov-2 infection and should not be used as the sole basis for treatment or other patient management decisions. A negative result may occur with  improper specimen collection/handling, submission of specimen other than nasopharyngeal swab, presence of viral mutation(s) within the areas targeted by this assay, and inadequate number of viral copies(<138 copies/mL). A negative result must be combined  with clinical observations, patient history, and epidemiological information. The expected result is Negative.  Fact Sheet for Patients:  EntrepreneurPulse.com.au  Fact Sheet for Healthcare Providers:  IncredibleEmployment.be  This test is no t yet approved or cleared by the [place] FDA and  has been authorized for detection and/or diagnosis of SARS-CoV-2 by FDA under an Emergency Use Authorization (EUA). This EUA will remain  in effect (meaning this test can be used) for the duration of the COVID-19 declaration under Section 564(b)(1) of the Act, 21 U.S.C.section 360bbb-3(b)(1), unless the authorization is terminated  or revoked sooner.       Influenza A by PCR NEGATIVE NEGATIVE   Influenza B by PCR NEGATIVE NEGATIVE    Comment: (NOTE) The Xpert Xpress SARS-CoV-2/FLU/RSV plus assay is intended as an aid in the diagnosis of influenza from Nasopharyngeal swab specimens and should not be used as a sole basis for treatment. Nasal washings and aspirates are unacceptable for Xpert Xpress SARS-CoV-2/FLU/RSV testing.  Fact Sheet for Patients: EntrepreneurPulse.com.au  Fact Sheet for Healthcare Providers: IncredibleEmployment.be  This test is not yet approved or cleared by the [place] FDA and has been authorized for detection and/or diagnosis of SARS-CoV-2 by FDA under an Emergency Use Authorization (EUA). This EUA will remain in effect (meaning this test can be used) for the duration of the COVID-19 declaration under Section 564(b)(1) of the Act, 21 U.S.C. section 360bbb-3(b)(1), unless the authorization is terminated or revoked.     Resp Syncytial Virus by PCR NEGATIVE NEGATIVE    Comment: (NOTE) Fact Sheet for Patients: EntrepreneurPulse.com.au  Fact Sheet for Healthcare Providers: IncredibleEmployment.be  This test is not yet  approved or cleared by the  [place] and has been authorized for detection and/or diagnosis of SARS-CoV-2 by FDA under an Emergency Use Authorization (EUA). This EUA will remain in effect (meaning this test can be used) for the duration of the COVID-19 declaration under Section 564(b)(1) of the Act, 21 U.S.C. section 360bbb-3(b)(1), unless the authorization is terminated or revoked.  Performed at [facility], [address] [place], [place] [address]   Lactic acid, plasma     Status: Abnormal   Collection Time: 10/11/22  4:48 PM  Result Value Ref Range   Lactic Acid, Venous 2.5 (HH) 0.5 - 1.9 mmol/L    Comment: CRITICAL RESULT CALLED TO, READ BACK BY AND VERIFIED WITH [place] HAHLE _0  ON 10/11/22 SKL Performed at [facility], [address] [place], [place] [address]   TSH     Status: None   Collection Time: 10/11/22  4:48 PM  Result Value Ref Range   TSH 1.223 0.350 - 4.500 uIU/mL    Comment: Performed by a 3rd Generation assay with a functional sensitivity of <=0.01 uIU/mL. Performed at [facility], [address], [place],  [address]     Current Facility-Administered Medications  Medication Dose Route Frequency Provider Last Rate Last Admin   acetaminophen (TYLENOL) tablet 650 mg  650 mg Oral Q4H PRN Duffy Bruce, MD       alum & mag hydroxide-simeth (MAALOX/MYLANTA) 200-200-20 MG/5ML suspension 30 mL  30 mL Oral Q6H PRN Duffy Bruce, MD       cyanocobalamin (VITAMIN B12) tablet 1,000 mcg  1,000 mcg Oral Daily Duffy Bruce, MD   1,000 mcg at 10/11/22 1834   fluvoxaMINE (LUVOX) tablet 25 mg  25 mg Oral QHS Duffy Bruce, MD       linagliptin (TRADJENTA) tablet 5 mg  5 mg Oral Daily Duffy Bruce, MD       lisinopril (ZESTRIL) tablet 5 mg  5 mg Oral Daily Duffy Bruce, MD   5 mg at 10/11/22 1834   rosuvastatin (CRESTOR) tablet 10 mg  10 mg Oral Daily Duffy Bruce, MD       Vitamin D (Ergocalciferol) (DRISDOL) 1.25 MG (50000 UNIT)  capsule 50,000 Units  50,000 Units Oral Q7 days Duffy Bruce, MD       Current Outpatient Medications  Medication Sig Dispense Refill   Accu-Chek FastClix Lancets MISC To check blood sugar 2-3 times daily 102 each 5   Blood Glucose Monitoring Suppl (ACCU-CHEK GUIDE ME) w/Device KIT Use to check blood sugars 2-3 times daily with goals = <130 fasting in morning and <180 two hours after eating.  Bring blood sugar log to appointments. 1 kit 0   cyanocobalamin (VITAMIN B12) 1000 MCG tablet Take 1 tablet (1,000 mcg total) by mouth daily. 90 tablet 4   fluvoxaMINE (LUVOX) 25 MG tablet Take 1 tablet (25 mg total) by mouth at bedtime. 30 tablet 3   glucose blood (ACCU-CHEK GUIDE) test strip Use to check blood sugars 2-3 times daily with goals = <130 fasting in morning and <180 two hours after eating. Bring blood sugar log to appointments. 100 each 12   lisinopril (ZESTRIL) 5 MG tablet Take 1 tablet (5 mg total) by mouth daily. 90 tablet 4   rosuvastatin (CRESTOR) 10 MG tablet Take 1 tablet (10 mg total) by mouth daily. 90 tablet 4   sitaGLIPtin (JANUVIA) 100 MG tablet Take 1 tablet (100 mg total) by mouth daily. 30 tablet 3   Vitamin D,  Ergocalciferol, (DRISDOL) 1.25 MG (50000 UNIT) CAPS capsule Take 1 capsule (50,000 Units total) by mouth every 7 (seven) days. 12 capsule 0    Musculoskeletal: Strength & Muscle Tone: decreased Gait & Station: normal Patient leans: N/A  Psychiatric Specialty Exam:  Presentation  General Appearance: Appropriate for Environment  Eye Contact:Good  Speech:Clear and Coherent  Speech Volume:Normal  Handedness:Right   Mood and Affect  Mood:Anxious; Depressed  Affect:Congruent   Thought Process  Thought Processes:Linear  Descriptions of Associations:Intact  Orientation:Full (Time, Place and Person)  Thought Content:Paranoid Ideation ("I will have diarrhea if  I eat or drink")  History of Schizophrenia/Schizoaffective disorder:No  Duration of  Psychotic Symptoms:No data recorded Hallucinations:Hallucinations: None  Ideas of Reference:Paranoia  Suicidal Thoughts:Suicidal Thoughts: No  Homicidal Thoughts:Homicidal Thoughts: No   Sensorium  Memory:Immediate Good  Judgment:Poor  Insight:Fair   Executive Functions  Concentration:Fair  Attention Span:Good  [place]   Psychomotor Activity  Psychomotor Activity:Psychomotor Activity: Normal   Assets  Assets:Desire for Improvement; Financial Resources/Insurance; Housing   Sleep  Sleep:Sleep: Fair   Physical Exam: Physical Exam Vitals and nursing note reviewed.  HENT:     Head: Normocephalic.     Nose: No congestion or rhinorrhea.  Eyes:     General:        Right eye: No discharge.        Left eye: No discharge.  Pulmonary:     Effort: Pulmonary effort is normal.  Abdominal:     General: Abdomen is flat.  Musculoskeletal:        General: Normal range of motion.     Cervical back: Normal range of motion.  Neurological:     Mental Status: He is alert and oriented to person, place, and time.  Psychiatric:        Attention and Perception: Attention normal.        Mood and Affect: Mood is anxious and depressed. Affect is blunt.        Speech: Speech normal.        Behavior: Behavior is cooperative.        Thought Content: Thought content is paranoid. Thought content does not include homicidal or suicidal ideation.        Cognition and Memory: Cognition normal.        Judgment: Judgment is impulsive.    Review of Systems  Constitutional:  Positive for weight loss.  HENT: Negative.    Eyes: Negative.   Respiratory: Negative.    Musculoskeletal: Negative.   Psychiatric/Behavioral:  Positive for depression. Negative for hallucinations, memory loss, substance abuse and suicidal ideas. The patient is nervous/anxious. The patient does not have insomnia.    Blood pressure (!) 156/97, pulse 94, temperature 97.7  F (36.5 C), temperature source Oral, resp. rate 18, SpO2 98 %. There is no height or weight on file to calculate BMI.  Treatment Plan Summary: Plan admission to geriatric psychiatry unit after medically cleared. Reviewed with EDP  Disposition: Recommend psychiatric Inpatient admission when medically cleared.  Sherlon Handing, NP 10/11/2022 6:52 PM

## 2022-10-11 NOTE — ED Notes (Signed)
Pt refused nighttime snack. 

## 2022-10-11 NOTE — Assessment & Plan Note (Signed)
Noted on labs March 2023, he is taking supplement daily at this time.  Recheck on labs.

## 2022-10-11 NOTE — ED Notes (Signed)
Pt offered dinner tray and a drink. Pt politely declined both stating "I have lost 20 lbs since thanksgiving and have not really had an appetite since then. I am scared that whatever I eat will give me diarrhea." Pt has notified RN of these concerns. Pt advised to come to this tech or quad staff of any needs arise or if he becomes hungry or thirsty. Pt shows verbal understanding.

## 2022-10-11 NOTE — Assessment & Plan Note (Signed)
Sliding-scale insulin 

## 2022-10-11 NOTE — Assessment & Plan Note (Signed)
Chronic, ongoing.  Noted past labs and is taking supplement, recheck today.

## 2022-10-11 NOTE — ED Notes (Signed)
Pt denies any previous attempt to hurt or kill self; pt denies thoughts, intentions or plan currently to hurt self or others.

## 2022-10-11 NOTE — Assessment & Plan Note (Signed)
Chronic, ongoing.  Will continue Rosuvastatin 10 MG daily as is tolerating and monitor, adjust as needed.  Lipid panel and BMP today.

## 2022-10-11 NOTE — ED Notes (Signed)
Lt green, red, SST, x2, lav and lactic tubes sent to lab. Lactic on ice.

## 2022-10-11 NOTE — ED Notes (Signed)
Pt resting calmly in bed; alert.

## 2022-10-11 NOTE — ED Notes (Signed)
Pt placed on cardiac monitoring, BP cuff, and pulse Ox.

## 2022-10-11 NOTE — Assessment & Plan Note (Signed)
Recheck on labs today. ?

## 2022-10-11 NOTE — Assessment & Plan Note (Addendum)
Ketonuria, related to fear of eating Patient reports not eating or drinking for fear of developing diarrhea IV hydration Nutritionist consult Psych evaluated patient in the ED and will admit once medically stabilized

## 2022-10-11 NOTE — Assessment & Plan Note (Signed)
BMI 27.78 with 24 pounds loss recently due to mood and no eating.  Recommended eating smaller high protein, low fat meals more frequently and exercising 30 mins a day 5 times a week with a goal of 10-15lb weight loss in the next 3 months. Patient voiced their understanding and motivation to adhere to these recommendations.

## 2022-10-11 NOTE — ED Triage Notes (Addendum)
Pt. To ED from [facility] for low appetite, anxiety, and depression. Pt. Lives by himself in home. Pt. States he is not eating because he has lack of appetite and is afraid of diarrhea. Pt. Denies SI/HI, or wanting to die.

## 2022-10-11 NOTE — ED Notes (Signed)
Will collect repeat lactic after 2nd NS bolus finishes.

## 2022-10-11 NOTE — ED Notes (Addendum)
Pt dressed out by this tech, Pt belongings as follows:  1 pair brown shoes 1 underwear 1 pair jeans 1 grey tshirt 1 blue stripe tshirt 2 insurance cards 1 drivers license

## 2022-10-11 NOTE — Assessment & Plan Note (Signed)
Chronic, exacerbated at this time due to diarrhea with Metformin.  Denies SI/HI.  He has lost 24 pounds recently due to lack of eating and mood instability.  Discussed restarting Fluvoxamine which he too in past, sent this in.  However, major concern he will not take any medications due to conversation today with patient and current physical exam.  He is more withdrawn today with minimal interaction with provider.  Discussed with family and recommend ER visit today for possible short term inpatient admission to get medications initiated in safe environment, which they agree with. 

## 2022-10-11 NOTE — ED Provider Triage Note (Signed)
Emergency Medicine Provider Triage Evaluation Note  Dillon [name] , a 71 y.o. male  was evaluated in triage.  Pt complains of lack of appetite. Had an appointment today at [facility]. Patient has anxiety and depression. [facility] physician gave him the option of changing his medications and trying adjustments at home, but did not think that he will do it so she advised he come to the ER as she was uncertain that he could care for himself. Patient denies being depressed, denies SI. Son at bedside comes 4-5 times per week. He thinks this has been going on for 2 weeks. Patient reports that he has not been eating because he is afraid of having diarrhea. Son reports that this has happened before.   Patient Active Problem List   Diagnosis Date Noted   Hypernatremia 04/10/2022   Vitamin B12 deficiency 01/01/2022   Vitamin D deficiency 12/31/2021   Hyperlipidemia associated with type 2 diabetes mellitus (HCC) 10/31/2021   Type 2 diabetes mellitus with proteinuria (HCC) 10/31/2021   Type 2 diabetes mellitus with hyperglycemia, without long-term current use of insulin (HCC) 09/28/2021   Obesity 04/22/2020   Preventative health care 10/22/2019   Depression, major, single episode, complete remission (HCC) 12/15/2015   OCD (obsessive compulsive disorder) 12/15/2015   Benign localized hyperplasia of prostate with urinary obstruction 11/24/2015   Anomalies of urachus, congenital 10/23/2015   .  Review of Systems  Positive: Lack of appetite Negative: SI/HI/AVH  Physical Exam  There were no vitals taken for this visit. Gen:   Awake, no distress   Resp:  Normal effort  MSK:   Moves extremities without difficulty  Other:    Medical Decision Making  Medically screening exam initiated at 11:50 AM.  Appropriate orders placed.  [name] was informed that the remainder of the evaluation will be completed by another provider, this initial triage assessment does not replace that evaluation, and  the importance of remaining in the ED until their evaluation is complete.     Jackelyn Hoehn, PA-C 10/11/22 1154

## 2022-10-11 NOTE — ED Notes (Signed)
VOLUNTARY awaiting TTS/PSYCH consult 

## 2022-10-11 NOTE — Consult Note (Signed)
Pharmacy Antibiotic Note  Dillon [name] is a 71 y.o. male admitted on 10/11/2022 with UTI.  Pharmacy has been consulted for cefepime dosing.  Plan: After discussion with Dr. Para March, antibiotics will be changed ceftriaxone .     Temp (24hrs), Avg:97.8 F (36.6 C), Min:97.7 F (36.5 C), Max:97.9 F (36.6 C)  Recent Labs  Lab 10/11/22 1153 10/11/22 1648  WBC 10.0  --   CREATININE 1.02  --   LATICACIDVEN  --  2.5*    Estimated Creatinine Clearance: 74.1 mL/min (by C-G formula based on SCr of 1.02 mg/dL).    Allergies  Allergen Reactions   Codeine Other (See Comments)    Unsure of reaction    Antimicrobials this admission: 12/26 ceftriaxone >>    Dose adjustments this admission:    Thank you for allowing pharmacy to be a part of this patient's care.  Sharen Hones, PharmD, BCPS Clinical Pharmacist   10/11/2022 10:25 PM

## 2022-10-11 NOTE — Progress Notes (Signed)
BP (!) 147/97   Pulse 88 Comment: apical  Temp 97.9 F (36.6 C) (Oral)   Ht 5' 9.02" (1.753 m)   Wt 188 lb 3.2 oz (85.4 kg)   SpO2 97%   BMI 27.78 kg/m    Subjective:    Patient ID: [name], male    DOB: 02/16/1951, 71 y.o.   MRN: [id number]  HPI: Gracyn Shellhorn is a 71 y.o. male  Chief Complaint  Patient presents with   Diabetes   Depression   Hypertension   Son and daughter at bedside today to assist with HPI  DIABETES Last visit in June 2023 A1c was 6.2%.  Initial diagnosis in December 2022, A1c was 9.9%.  He has not consistently been taking medications, Metformin and Lisinopril -- due to phobias.  Having diarrhea with Metformin and noticing lack of appetite, so stopped taking consistently.  He states he has been drinking Ensure to keep calories.  Has lost 24 pounds since June visit.  He is also taking Vitamin D for low levels on labs -- weekly dosing.   Taking B12 daily for low levels past checks. Hypoglycemic episodes:no Polydipsia/polyuria: none Visual disturbance: no Chest pain: no Paresthesias: no Glucose Monitoring: yes  Accucheck frequency: not checking  Fasting glucose:  Post prandial:  Evening:  Before meals: Taking Insulin?: no  Long acting insulin:  Short acting insulin: Blood Pressure Monitoring: not checking Retinal Examination: Up To Date -- macular degeneration Foot Exam: Up to Date Pneumovax: Not up to Date - refuses Influenza: Not up to Date - refuses Aspirin: no   HYPERTENSION / HYPERLIPIDEMIA Currently to be taking Lisinopril 5 MG daily + Crestor 10 MG daily, however not taking consistently at this time due to phobia about diarrhea. Satisfied with current treatment? yes Duration of hypertension: chronic BP monitoring frequency: not checking BP range:  BP medication side effects: no Past BP meds:  Duration of hyperlipidemia: chronic Aspirin: no Recent stressors: no Recurrent headaches: no Visual changes:  no Palpitations: no Dyspnea: no Chest pain: no Lower extremity edema: no Dizzy/lightheaded: no   OCD Family present at bedside -- they report he is not eating food and worried about eating foods due to diarrhea. They report he is more fearful lately and not taking medications consistently.  We discussed starting medication for mood today and changing diabetes medication, but he is very hyper focused on diarrhea and reports he may not take medications.  We discussed goal to keep out of hospital and start treatment outpatient, but he endorses he will not follow plan of care  Was on medications previously for OCD.  Has history of hospitalization for mood, last in 2018.  Previously saw psychiatry -- took Fluvoxamine and Olanzapine + had ECT in past.   Mood status: exacerbated Satisfied with current treatment?: no Symptom severity: severe  Duration of current treatment : chronic Side effects: no Medication compliance: poor compliance Psychotherapy/counseling: yes in the past Depressed mood: yes Anxious mood: yes Anhedonia: yes Significant weight loss or gain: yes Insomnia: yes hard to stay asleep Fatigue: no Feelings of worthlessness or guilt: no Impaired concentration/indecisiveness: no Suicidal ideations: no Hopelessness: yes Crying spells: no    10/11/2022   12:16 PM 04/08/2022    1:58 PM 02/10/2022   11:21 AM 12/31/2021    2:15 PM 11/02/2021    3:14 PM  Depression screen PHQ 2/9  Decreased Interest 3 0 0 0 0  Down, Depressed, Hopeless 2 0 0 1 0  PHQ -  2 Score 5 0 0 1 0  Altered sleeping 2 2 0 2 0  Tired, decreased energy 2 2 0 0 1  Change in appetite 3 0 0 0 0  Feeling bad or failure about yourself  2 0 0 0 0  Trouble concentrating 1 0 0 0 0  Moving slowly or fidgety/restless 1 0 0 0 0  Suicidal thoughts 0 0 0 0 0  PHQ-9 Score 16 4 0 3 1  Difficult doing work/chores Very difficult Not difficult at all Not difficult at all Not difficult at all        10/11/2022   12:16  PM 04/08/2022    1:58 PM 12/31/2021    2:16 PM 11/02/2021    3:14 PM  GAD 7 : Generalized Anxiety Score  Nervous, Anxious, on Edge 3 0 1 0  Control/stop worrying 3 1 0 0  Worry too much - different things 3 1 0 0  Trouble relaxing 1 0 0 0  Restless 1 0 0 0  Easily annoyed or irritable 1 1 0 0  Afraid - awful might happen 3 0 0 0  Total GAD 7 Score 15 3 1  0  Anxiety Difficulty Somewhat difficult Not difficult at all Not difficult at all Not difficult at all      Relevant past medical, surgical, family and social history reviewed and updated as indicated. Interim medical history since our last visit reviewed. Allergies and medications reviewed and updated.  Review of Systems  Constitutional:  Negative for activity change, diaphoresis, fatigue and fever.  Respiratory:  Negative for cough, chest tightness, shortness of breath and wheezing.   Cardiovascular:  Negative for chest pain, palpitations and leg swelling.  Gastrointestinal: Negative.   Neurological: Negative.   Psychiatric/Behavioral:  Negative for decreased concentration, self-injury, sleep disturbance and suicidal ideas. The patient is not nervous/anxious.     Per HPI unless specifically indicated above     Objective:    BP (!) 147/97   Pulse 88 Comment: apical  Temp 97.9 F (36.6 C) (Oral)   Ht 5' 9.02" (1.753 m)   Wt 188 lb 3.2 oz (85.4 kg)   SpO2 97%   BMI 27.78 kg/m   Wt Readings from Last 3 Encounters:  10/11/22 188 lb 3.2 oz (85.4 kg)  04/08/22 212 lb 6.4 oz (96.3 kg)  12/31/21 216 lb (98 kg)    Physical Exam Vitals and nursing note reviewed.  Constitutional:      General: He is awake. He is not in acute distress.    Appearance: He is well-developed and well-groomed. He is obese. He is not ill-appearing.  HENT:     Head: Normocephalic and atraumatic.     Right Ear: Hearing normal. No drainage.     Left Ear: Hearing normal. No drainage.  Eyes:     General: Lids are normal.        Right eye: No  discharge.        Left eye: No discharge.     Conjunctiva/sclera: Conjunctivae normal.     Pupils: Pupils are equal, round, and reactive to light.  Neck:     Thyroid: No thyromegaly.     Vascular: No carotid bruit.     Trachea: Trachea normal.  Cardiovascular:     Rate and Rhythm: Normal rate and regular rhythm.     Heart sounds: Normal heart sounds, S1 normal and S2 normal. No murmur heard.    No gallop.  Pulmonary:  Effort: Pulmonary effort is normal. No accessory muscle usage or respiratory distress.     Breath sounds: Normal breath sounds.  Abdominal:     General: Bowel sounds are normal.     Palpations: Abdomen is soft.  Musculoskeletal:        General: Normal range of motion.     Cervical back: Normal range of motion and neck supple.     Right lower leg: No edema.     Left lower leg: No edema.  Skin:    General: Skin is warm and dry.     Capillary Refill: Capillary refill takes less than 2 seconds.  Neurological:     Mental Status: He is alert and oriented to person, place, and time.     Deep Tendon Reflexes: Reflexes are normal and symmetric.  Psychiatric:        Attention and Perception: Attention normal.        Mood and Affect: Mood is anxious. Affect is flat.        Speech: Speech normal.        Behavior: Behavior is withdrawn. Behavior is cooperative.        Thought Content: Thought content is paranoid.     Comments: Mood changed from previous visits, today more withdrawn with flat affect and anxiety present.  Some paranoid thought processes in regard to medications. Less talkative and minimal eye contact.    Results for orders placed or performed in visit on 10/11/22  Bayer DCA Hb A1c Waived  Result Value Ref Range   HB A1C (BAYER DCA - WAIVED) 6.4 (H) 4.8 - 5.6 %      Assessment & Plan:   Problem List Items Addressed This Visit       Endocrine   Hyperlipidemia associated with type 2 diabetes mellitus (HCC)    Chronic, ongoing.  Will continue  Rosuvastatin 10 MG daily as is tolerating and monitor, adjust as needed.  Lipid panel and BMP today.      Relevant Medications   lisinopril (ZESTRIL) 5 MG tablet   rosuvastatin (CRESTOR) 10 MG tablet   sitaGLIPtin (JANUVIA) 100 MG tablet   Other Relevant Orders   Bayer DCA Hb A1c Waived (Completed)   Lipid Panel w/o Chol/HDL Ratio   Type 2 diabetes mellitus with hyperglycemia, without long-term current use of insulin (HCC)    Diagnosed December 2022 -- A1c 9.9% at the time -- A1c today is 6.4%, remaining stable although not taking Metformin consistently due to diarrhea.  We discussed at length stopping this and start Januvia 100 MG daily, will send this in.  Educated on medication.  Could consider GLP1 or SGLT2 in future, however unsure he will perform weekly injection and concern for increased urination with SGLT2.  BS at home is trending down.  Continue to check BS at home daily and document.  Focus on diabetic diet.  LABS: BMP, TSH, B12.      Relevant Medications   lisinopril (ZESTRIL) 5 MG tablet   rosuvastatin (CRESTOR) 10 MG tablet   sitaGLIPtin (JANUVIA) 100 MG tablet   Other Relevant Orders   Bayer DCA Hb A1c Waived (Completed)   Basic metabolic panel   Type 2 diabetes mellitus with proteinuria (HCC) - Primary    Diagnosed December 2022 -- A1c 9.9% at the time -- A1c today is 6.4%, remaining stable although not taking Metformin consistently due to diarrhea.  We discussed at length stopping this and start Januvia 100 MG daily, will send this in.  Educated on medication.  Could consider GLP1 or SGLT2 in future, however unsure he will perform weekly injection and concern for increased urination with SGLT2.  Recommend he continue Lisinopril for proteinuria.  BS at home is trending down.  Continue to check BS at home daily and document.  Focus on diabetic diet.        Relevant Medications   lisinopril (ZESTRIL) 5 MG tablet   rosuvastatin (CRESTOR) 10 MG tablet   sitaGLIPtin (JANUVIA)  100 MG tablet   Other Relevant Orders   Bayer DCA Hb A1c Waived (Completed)   Basic metabolic panel     Genitourinary   Benign localized hyperplasia of prostate with urinary obstruction   Relevant Orders   PSA     Other   Depression, major, single episode, complete remission (HCC)    Chronic, exacerbated at this time due to diarrhea with Metformin.  Denies SI/HI.  He has lost 24 pounds recently due to lack of eating and mood instability.  Discussed restarting Fluvoxamine which he too in past, sent this in.  However, major concern he will not take any medications due to conversation today with patient and current physical exam.  He is more withdrawn today with minimal interaction with provider.  Discussed with family and recommend ER visit today for possible short term inpatient admission to get medications initiated in safe environment, which they agree with.      Relevant Medications   fluvoxaMINE (LUVOX) 25 MG tablet   Other Relevant Orders   TSH   Hypernatremia    Recheck on labs today.      Relevant Orders   Basic metabolic panel   Obesity    BMI 27.78 with 24 pounds loss recently due to mood and no eating.  Recommended eating smaller high protein, low fat meals more frequently and exercising 30 mins a day 5 times a week with a goal of 10-15lb weight loss in the next 3 months. Patient voiced their understanding and motivation to adhere to these recommendations.       Relevant Medications   sitaGLIPtin (JANUVIA) 100 MG tablet   OCD (obsessive compulsive disorder)    Chronic, exacerbated at this time due to diarrhea with Metformin.  Denies SI/HI.  He has lost 24 pounds recently due to lack of eating and mood instability.  Discussed restarting Fluvoxamine which he too in past, sent this in.  However, major concern he will not take any medications due to conversation today with patient and current physical exam.  He is more withdrawn today with minimal interaction with provider.   Discussed with family and recommend ER visit today for possible short term inpatient admission to get medications initiated in safe environment, which they agree with.      Relevant Medications   fluvoxaMINE (LUVOX) 25 MG tablet   Vitamin B12 deficiency    Noted on labs March 2023, he is taking supplement daily at this time.  Recheck on labs.      Relevant Orders   Vitamin B12   Vitamin D deficiency    Chronic, ongoing.  Noted past labs and is taking supplement, recheck today.      Relevant Orders   VITAMIN D 25 Hydroxy (Vit-D Deficiency, Fractures)     Follow up plan: Return in about 2 weeks (around 10/25/2022) for MOOD.

## 2022-10-11 NOTE — Assessment & Plan Note (Signed)
Chronic, exacerbated at this time due to diarrhea with Metformin.  Denies SI/HI.  He has lost 24 pounds recently due to lack of eating and mood instability.  Discussed restarting Fluvoxamine which he too in past, sent this in.  However, major concern he will not take any medications due to conversation today with patient and current physical exam.  He is more withdrawn today with minimal interaction with provider.  Discussed with family and recommend ER visit today for possible short term inpatient admission to get medications initiated in safe environment, which they agree with.

## 2022-10-11 NOTE — Assessment & Plan Note (Signed)
Repleted in the ED Continue to monitor 

## 2022-10-11 NOTE — Assessment & Plan Note (Addendum)
OCD Patient has not been eating due to fear of developing diarrhea Evaluated by psychiatry who will admit patient to their service after medical stabilization

## 2022-10-11 NOTE — H&P (Signed)
History and Physical    Patient: Dillon [name] HUT:[id number] DOB: 25-Jul-1951 DOA: 10/11/2022 DOS: the patient was seen and examined on 10/11/2022 PCP: Venita Lick, NP  Patient coming from: Home  Chief Complaint:  Chief Complaint  Patient presents with   Psychiatric Evaluation    Pt. To ED from [facility] for low appetite, anxiety, and depression. Pt. Lives by himself in home. Pt. States he is not eating because he has lack of appetite and is afraid of diarrhea.    HPI: Dillon [name] is a 71 y.o. male with medical history significant for Diabetes, depression and OCD with prior psychiatric hospitalizations with ECT in the pastWho present to the ED at the recommendation of his PCP for evaluation of reported refusal to eat and drink for fear of developing diarrhea.  Patient has reduce his oral intake of food and water for the past several months losing over 20 pounds and has been very fearful of getting out of the house.  Has had the symptoms before and they responded to ECT.  Patient otherwise feels at his baseline but endorses mild right flank pain.  Has decreased appetite but denies nausea, vomiting, abdominal pain or diarrhea.  Denies fever or chills. ED course and data review: BP 160/116 with pulse 116.  Afebrile and O2 sat 99% on room air.  Labs significant for normal WBC, but with lactic acid 2.5, hemoglobin 18.1, calcium 10.4, normal creatinine of 1.01, potassium 3.1, COVID flu and RSV negative, normal TSH.  Urinalysis showed 80 ketones and moderate leukocyte esterase.  EKG, personally viewed and interpreted shows sinus at 114 with prolonged QTc of 631 without ischemic ST-T wave changes. Imaging showed right renal staghorn calculus with severe right-sided hydronephrosis and fat stranding.  CT head nonacute. The ED provider spoke with urologist Dr. Bernardo Heater who would like to stent patient in the a.m. Patient was evaluated by psychiatry in the ED and they are willing to admit patient to  their service once medically cleared.  Patient was treated with an NS bolus and started on ceftriaxone and hospitalist consulted for admission.   Review of Systems: As mentioned in the history of present illness. All other systems reviewed and are negative.  Past Medical History:  Diagnosis Date   Kidney stone    Meningitis    spinal   OCD (obsessive compulsive disorder)    Shingles    Past Surgical History:  Procedure Laterality Date   KIDNEY STONE SURGERY     Social History:  reports that he has never smoked. He has never used smokeless tobacco. He reports that he does not drink alcohol and does not use drugs.  Allergies  Allergen Reactions   Codeine Other (See Comments)    Unsure of reaction    Family History  Problem Relation Age of Onset   Cancer Mother        bladder   Emphysema Father    Emphysema Sister    COPD Sister    Cancer Brother        prostate    Prior to Admission medications   Medication Sig Start Date End Date Taking? Authorizing Provider  Accu-Chek FastClix Lancets MISC To check blood sugar 2-3 times daily 01/05/22   Cannady, Henrine Screws T, NP  Blood Glucose Monitoring Suppl (ACCU-CHEK GUIDE ME) w/Device KIT Use to check blood sugars 2-3 times daily with goals = <130 fasting in morning and <180 two hours after eating.  Bring blood sugar log to appointments. 01/03/22  Cannady, Jolene T, NP  cyanocobalamin (VITAMIN B12) 1000 MCG tablet Take 1 tablet (1,000 mcg total) by mouth daily. 10/11/22   Cannady, Henrine Screws T, NP  fluvoxaMINE (LUVOX) 25 MG tablet Take 1 tablet (25 mg total) by mouth at bedtime. 10/11/22   Cannady, Henrine Screws T, NP  glucose blood (ACCU-CHEK GUIDE) test strip Use to check blood sugars 2-3 times daily with goals = <130 fasting in morning and <180 two hours after eating. Bring blood sugar log to appointments. 01/03/22   Cannady, Henrine Screws T, NP  lisinopril (ZESTRIL) 5 MG tablet Take 1 tablet (5 mg total) by mouth daily. 10/11/22   Cannady, Henrine Screws T, NP   rosuvastatin (CRESTOR) 10 MG tablet Take 1 tablet (10 mg total) by mouth daily. 10/11/22   Cannady, Henrine Screws T, NP  sitaGLIPtin (JANUVIA) 100 MG tablet Take 1 tablet (100 mg total) by mouth daily. 10/11/22   Cannady, Henrine Screws T, NP  Vitamin D, Ergocalciferol, (DRISDOL) 1.25 MG (50000 UNIT) CAPS capsule Take 1 capsule (50,000 Units total) by mouth every 7 (seven) days. 10/11/22   Venita Lick, NP    Physical Exam: Vitals:   10/11/22 1152 10/11/22 1826 10/11/22 2009  BP: (!) 160/111 (!) 156/97 (!) 155/99  Pulse: (!) 116 94 94  Resp: _0 Temp: 97.7 F (36.5 C)  97.9 F (36.6 C)  TempSrc: Oral  Oral  SpO2: 99% 98% 97%   Physical Exam Vitals and nursing note reviewed.  Constitutional:      General: He is not in acute distress. HENT:     Head: Normocephalic and atraumatic.  Cardiovascular:     Rate and Rhythm: Normal rate and regular rhythm.     Heart sounds: Normal heart sounds.  Pulmonary:     Effort: Pulmonary effort is normal.     Breath sounds: Normal breath sounds.  Abdominal:     Palpations: Abdomen is soft.     Tenderness: There is no abdominal tenderness.  Neurological:     Mental Status: Mental status is at baseline.     Labs on Admission: I have personally reviewed following labs and imaging studies  CBC: Recent Labs  Lab 10/11/22 1153  WBC 10.0  NEUTROABS 6.6  HGB 18.1*  HCT 52.4*  MCV 89.7  PLT 700   Basic Metabolic Panel: Recent Labs  Lab 10/11/22 1153  NA 140  K 3.1*  CL 102  CO2 27  GLUCOSE 178*  BUN 18  CREATININE 1.02  CALCIUM 10.4*   GFR: Estimated Creatinine Clearance: 74.1 mL/min (by C-G formula based on SCr of 1.02 mg/dL). Liver Function Tests: Recent Labs  Lab 10/11/22 1153  AST 37  ALT 60*  ALKPHOS 106  BILITOT 2.3*  PROT 7.3  ALBUMIN 4.8   Recent Labs  Lab 10/11/22 1153  LIPASE 33   No results for input(s): "AMMONIA" in the last 168 hours. Coagulation Profile: No results for input(s): "INR", "PROTIME" in  the last 168 hours. Cardiac Enzymes: No results for input(s): "CKTOTAL", "CKMB", "CKMBINDEX", "TROPONINI" in the last 168 hours. BNP (last 3 results) No results for input(s): "PROBNP" in the last 8760 hours. HbA1C: Recent Labs    10/11/22 1020  HGBA1C 6.4*   CBG: No results for input(s): "GLUCAP" in the last 168 hours. Lipid Profile: No results for input(s): "CHOL", "HDL", "LDLCALC", "TRIG", "CHOLHDL", "LDLDIRECT" in the last 72 hours. Thyroid Function Tests: Recent Labs    10/11/22 1648  TSH 1.223   Anemia Panel: No results for input(s): "VITAMINB12", "  FOLATE", "FERRITIN", "TIBC", "IRON", "RETICCTPCT" in the last 72 hours. Urine analysis:    Component Value Date/Time   COLORURINE AMBER (A) 10/11/2022 1830   APPEARANCEUR HAZY (A) 10/11/2022 1830   APPEARANCEUR Clear 09/28/2021 0849   LABSPEC 1.025 10/11/2022 1830   PHURINE 5.0 10/11/2022 1830   GLUCOSEU NEGATIVE 10/11/2022 1830   HGBUR SMALL (A) 10/11/2022 1830   BILIRUBINUR NEGATIVE 10/11/2022 1830   BILIRUBINUR Negative 09/28/2021 0849   KETONESUR 80 (A) 10/11/2022 1830   PROTEINUR 100 (A) 10/11/2022 1830   NITRITE NEGATIVE 10/11/2022 1830   LEUKOCYTESUR MODERATE (A) 10/11/2022 1830    Radiological Exams on Admission: CT ABDOMEN PELVIS W CONTRAST  Result Date: 10/11/2022 CLINICAL DATA:  Abdominal pain, decreased appetite EXAM: CT ABDOMEN AND PELVIS WITH CONTRAST TECHNIQUE: Multidetector CT imaging of the abdomen and pelvis was performed using the standard protocol following bolus administration of intravenous contrast. RADIATION DOSE REDUCTION: This exam was performed according to the departmental dose-optimization program which includes automated exposure control, adjustment of the mA and/or kV according to patient size and/or use of iterative reconstruction technique. CONTRAST:  163m OMNIPAQUE IOHEXOL 300 MG/ML  SOLN COMPARISON:  01/05/2016 FINDINGS: Lower chest: No acute pleural or parenchymal lung disease.  Hepatobiliary: Multiple calcified and noncalcified gallstones fill the gallbladder lumen. No gallbladder wall thickening or pericholecystic fluid. The liver is unremarkable. No biliary duct dilation or choledocholithiasis. Pancreas: Unremarkable. No pancreatic ductal dilatation or surrounding inflammatory changes. Spleen: Normal in size without focal abnormality. Adrenals/Urinary Tract: Obstructing staghorn calculus at the right UPJ, measuring up to 3.0 cm in size. There is severe right-sided hydronephrosis with marked right renal cortical thinning suggesting longstanding obstruction. Fat stranding surround the proximal right ureter could reflect superimposed infection. Left kidney enhances normally. Benign 1.2 cm simple cyst posterior mid left kidney does not require follow-up. No left-sided obstruction. The adrenals are stable. The bladder is decompressed, with no gross abnormality. Stomach/Bowel: No bowel obstruction or ileus. Diffuse colonic diverticulosis without diverticulitis. Normal appendix right lower quadrant. No bowel wall thickening or inflammatory change. Vascular/Lymphatic: Aortic atherosclerosis. No enlarged abdominal or pelvic lymph nodes. Reproductive: Continued enlargement of the prostate. Other: No free fluid or free intraperitoneal gas. Fat containing left inguinal hernia. No bowel herniation. Musculoskeletal: No acute or destructive bony lesions. Reconstructed images demonstrate no additional findings. IMPRESSION: 1. Obstructing right renal staghorn calculus, measuring up to 3 cm in size. Severe right-sided hydronephrosis with associated right renal cortical thinning, suggesting longstanding obstruction. Fat stranding surrounding the right renal pelvis and proximal right ureter could reflect superimposed infection. Please correlate with urinalysis. 2. Diffuse colonic diverticulosis without diverticulitis. 3. Cholelithiasis without cholecystitis. 4. Stable enlarged prostate. 5.  Aortic  Atherosclerosis (ICD10-I70.0). Electronically Signed   By: MRanda NgoM.D.   On: 10/11/2022 18:55   CT HEAD WO CONTRAST (5MM)  Result Date: 10/11/2022 CLINICAL DATA:  Mental status change EXAM: CT HEAD WITHOUT CONTRAST TECHNIQUE: Contiguous axial images were obtained from the base of the skull through the vertex without intravenous contrast. RADIATION DOSE REDUCTION: This exam was performed according to the departmental dose-optimization program which includes automated exposure control, adjustment of the mA and/or kV according to patient size and/or use of iterative reconstruction technique. COMPARISON:  CT scan of the brain January 11, 2016 FINDINGS: Brain: No evidence of acute infarction, hemorrhage, hydrocephalus, extra-axial collection or mass lesion/mass effect. Mild white matter changes identified. Vascular: No hyperdense vessel or unexpected calcification. Skull: Normal. Negative for fracture or focal lesion. Sinuses/Orbits: Paranasal sinuses are normal. Opacification of  inferior right mastoid air cells identified, similar since the January 11, 2016 comparison. Significant opacification of the left sided mastoid air cells, also a stable finding. No bony erosion. The middle ears are well aerated. Other: No other abnormalities. IMPRESSION: 1. No acute intracranial abnormalities. 2. Chronic white matter changes. 3. Chronic opacification of the bilateral mastoid air cells. Electronically Signed   By: Dorise Bullion III M.D.   On: 10/11/2022 16:23     Data Reviewed: Relevant notes from primary care and specialist visits, past discharge summaries as available in EHR, including Care Everywhere. Prior diagnostic testing as pertinent to current admission diagnoses Updated medications and problem lists for reconciliation ED course, including vitals, labs, imaging, treatment and response to treatment Triage notes, nursing and pharmacy notes and ED provider's notes Notable results as noted in  HPI   Assessment and Plan: * Acute pyelonephritis Possible sepsis Staghorn calculus with right hydronephrosis Abnormal urinalysis, tachycardia with lactic acidosis Possible sepsis IV hydration and trend lactic acid Continue Rocephin and follow cultures Dr. Bernardo Heater consulted from the ED and will place stent in the a.m. N.p.o. from midnight  Major depressive disorder, recurrent episode, severe ([place]) OCD Patient has not been eating due to fear of developing diarrhea Evaluated by psychiatry who will admit patient to their service after medical stabilization  Inadequate oral intake Ketonuria, related to fear of eating Patient reports not eating or drinking for fear of developing diarrhea IV hydration Nutritionist consult Psych evaluated patient in the ED and will admit once medically stabilized   Hypokalemia Repleted in the ED Continue to monitor  Diabetes mellitus, type II ([place]) Sliding scale insulin        DVT prophylaxis: SCDs  Consults: Urology, Dr. Bernardo Heater  Advance Care Planning:   Code Status: Prior   Family Communication: Unable to get an touch with patient's son or daughter via telephone  Disposition Plan: Back to previous home environment  Severity of Illness: The appropriate patient status for this patient is INPATIENT. Inpatient status is judged to be reasonable and necessary in order to provide the required intensity of service to ensure the patient's safety. The patient's presenting symptoms, physical exam findings, and initial radiographic and laboratory data in the context of their chronic comorbidities is felt to place them at high risk for further clinical deterioration. Furthermore, it is not anticipated that the patient will be medically stable for discharge from the hospital within 2 midnights of admission.   * I certify that at the point of admission it is my clinical judgment that the patient will require inpatient hospital care spanning beyond 2  midnights from the point of admission due to high intensity of service, high risk for further deterioration and high frequency of surveillance required.*  Author: Athena Masse, MD 10/11/2022 8:59 PM  For on call review [email or web address].

## 2022-10-11 NOTE — Assessment & Plan Note (Addendum)
Possible sepsis Staghorn calculus with right hydronephrosis Abnormal urinalysis, tachycardia with lactic acidosis Possible sepsis IV hydration and trend lactic acid Continue Rocephin and follow cultures Dr. Lonna Cobb consulted from the ED and will place stent in the a.m. N.p.o. from midnight

## 2022-10-11 NOTE — ED Notes (Signed)
NT states will collect blood cultures and lactic and was reminded to place pt on monitor.

## 2022-10-11 NOTE — Assessment & Plan Note (Addendum)
Diagnosed December 2022 -- A1c 9.9% at the time -- A1c today is 6.4%, remaining stable although not taking Metformin consistently due to diarrhea.  We discussed at length stopping this and start Januvia 100 MG daily, will send this in.  Educated on medication.  Could consider GLP1 or SGLT2 in future, however unsure he will perform weekly injection and concern for increased urination with SGLT2.  BS at home is trending down.  Continue to check BS at home daily and document.  Focus on diabetic diet.  LABS: BMP, TSH, B12.

## 2022-10-12 ENCOUNTER — Observation Stay: Payer: Medicare HMO | Admitting: Anesthesiology

## 2022-10-12 ENCOUNTER — Other Ambulatory Visit: Payer: Self-pay

## 2022-10-12 ENCOUNTER — Encounter: Payer: Self-pay | Admitting: Internal Medicine

## 2022-10-12 ENCOUNTER — Encounter
Admission: EM | Disposition: A | Discharge status: Other Acute Inpatient Hospital | Payer: Self-pay | Source: Home / Self Care | Attending: Emergency Medicine

## 2022-10-12 ENCOUNTER — Observation Stay: Payer: Medicare HMO

## 2022-10-12 DIAGNOSIS — F332 Major depressive disorder, recurrent severe without psychotic features: Secondary | ICD-10-CM | POA: Diagnosis not present

## 2022-10-12 DIAGNOSIS — F418 Other specified anxiety disorders: Secondary | ICD-10-CM | POA: Diagnosis not present

## 2022-10-12 DIAGNOSIS — N10 Acute tubulo-interstitial nephritis: Secondary | ICD-10-CM | POA: Diagnosis not present

## 2022-10-12 DIAGNOSIS — N1330 Unspecified hydronephrosis: Secondary | ICD-10-CM | POA: Diagnosis not present

## 2022-10-12 DIAGNOSIS — G039 Meningitis, unspecified: Secondary | ICD-10-CM | POA: Diagnosis not present

## 2022-10-12 DIAGNOSIS — N200 Calculus of kidney: Secondary | ICD-10-CM | POA: Diagnosis not present

## 2022-10-12 DIAGNOSIS — N132 Hydronephrosis with renal and ureteral calculous obstruction: Secondary | ICD-10-CM | POA: Diagnosis not present

## 2022-10-12 DIAGNOSIS — T7840XA Allergy, unspecified, initial encounter: Secondary | ICD-10-CM | POA: Diagnosis not present

## 2022-10-12 HISTORY — PX: CYSTOSCOPY W/ URETERAL STENT PLACEMENT: SHX1429

## 2022-10-12 LAB — BASIC METABOLIC PANEL
Anion gap: 9 (ref 5–15)
BUN/Creatinine Ratio: 17 (ref 10–24)
BUN: 16 mg/dL (ref 8–27)
BUN: 14 mg/dL (ref 8–23)
CO2: 21 mmol/L (ref 20–29)
CO2: 26 mmol/L (ref 22–32)
Calcium: 11.3 mg/dL — ABNORMAL HIGH (ref 8.6–10.2)
Calcium: 10 mg/dL (ref 8.9–10.3)
Chloride: 105 mmol/L (ref 96–106)
Chloride: 108 mmol/L (ref 98–111)
Creatinine, Ser: 0.93 mg/dL (ref 0.76–1.27)
Creatinine, Ser: 0.81 mg/dL (ref 0.61–1.24)
GFR, Estimated: >60 mL/min (ref >60)
Glucose, Bld: 174 mg/dL — ABNORMAL HIGH (ref 70–99)
Glucose: 171 mg/dL — ABNORMAL HIGH (ref 70–99)
Potassium: 3.4 mmol/L — ABNORMAL LOW (ref 3.5–5.2)
Potassium: 3.3 mmol/L — ABNORMAL LOW (ref 3.5–5.1)
Sodium: 146 mmol/L — ABNORMAL HIGH (ref 134–144)
Sodium: 143 mmol/L (ref 135–145)
eGFR: 89 mL/min/{1.73_m2} (ref >59)

## 2022-10-12 LAB — GLUCOSE, CAPILLARY
Glucose-Capillary: 148 mg/dL — ABNORMAL HIGH (ref 70–99)
Glucose-Capillary: 146 mg/dL — ABNORMAL HIGH (ref 70–99)
Glucose-Capillary: 121 mg/dL — ABNORMAL HIGH (ref 70–99)

## 2022-10-12 LAB — COMPREHENSIVE METABOLIC PANEL
ALT: 60 U/L — ABNORMAL HIGH (ref 0–44)
AST: 32 U/L (ref 15–41)
Albumin: 4.4 g/dL (ref 3.5–5.0)
Alkaline Phosphatase: 109 U/L (ref 38–126)
Anion gap: 11 (ref 5–15)
BUN: 12 mg/dL (ref 8–23)
CO2: 25 mmol/L (ref 22–32)
Calcium: 9.9 mg/dL (ref 8.9–10.3)
Chloride: 109 mmol/L (ref 98–111)
Creatinine, Ser: 0.98 mg/dL (ref 0.61–1.24)
GFR, Estimated: >60 mL/min (ref >60)
Glucose, Bld: 166 mg/dL — ABNORMAL HIGH (ref 70–99)
Potassium: 3.3 mmol/L — ABNORMAL LOW (ref 3.5–5.1)
Sodium: 145 mmol/L (ref 135–145)
Total Bilirubin: 1.7 mg/dL — ABNORMAL HIGH (ref 0.3–1.2)
Total Protein: 6.8 g/dL (ref 6.5–8.1)

## 2022-10-12 LAB — VITAMIN D 25 HYDROXY (VIT D DEFICIENCY, FRACTURES): Vit D, 25-Hydroxy: 12.3 ng/mL — ABNORMAL LOW (ref 30.0–100.0)

## 2022-10-12 LAB — TSH: TSH: 1.11 u[IU]/mL (ref 0.450–4.500)

## 2022-10-12 LAB — CORTISOL: Cortisol, Plasma: 28.7 ug/dL

## 2022-10-12 LAB — VITAMIN B12: Vitamin B-12: 403 pg/mL (ref 232–1245)

## 2022-10-12 LAB — CBC
HCT: 50.8 % (ref 39.0–52.0)
Hemoglobin: 17.2 g/dL — ABNORMAL HIGH (ref 13.0–17.0)
MCH: 30.6 pg (ref 26.0–34.0)
MCHC: 33.9 g/dL (ref 30.0–36.0)
MCV: 90.4 fL (ref 80.0–100.0)
Platelets: 162 10*3/uL (ref 150–400)
RBC: 5.62 MIL/uL (ref 4.22–5.81)
RDW: 13 % (ref 11.5–15.5)
WBC: 11.1 10*3/uL — ABNORMAL HIGH (ref 4.0–10.5)
nRBC: 0 % (ref 0.0–0.2)

## 2022-10-12 LAB — LIPID PANEL W/O CHOL/HDL RATIO
Cholesterol, Total: 144 mg/dL (ref 100–199)
HDL: 44 mg/dL (ref >39)
LDL Chol Calc (NIH): 74 mg/dL (ref 0–99)
Triglycerides: 148 mg/dL (ref 0–149)
VLDL Cholesterol Cal: 26 mg/dL (ref 5–40)

## 2022-10-12 LAB — MAGNESIUM: Magnesium: 1.8 mg/dL (ref 1.7–2.4)

## 2022-10-12 LAB — CORTISOL-AM, BLOOD: Cortisol - AM: 26.1 ug/dL — ABNORMAL HIGH (ref 6.7–22.6)

## 2022-10-12 LAB — PSA: Prostate Specific Ag, Serum: 4 ng/mL (ref 0.0–4.0)

## 2022-10-12 LAB — CBG MONITORING, ED
Glucose-Capillary: 164 mg/dL — ABNORMAL HIGH (ref 70–99)
Glucose-Capillary: 147 mg/dL — ABNORMAL HIGH (ref 70–99)

## 2022-10-12 LAB — LACTIC ACID, PLASMA
Lactic Acid, Venous: 1.1 mmol/L (ref 0.5–1.9)
Lactic Acid, Venous: 1.9 mmol/L (ref 0.5–1.9)

## 2022-10-12 LAB — PHOSPHORUS: Phosphorus: 2.7 mg/dL (ref 2.5–4.6)

## 2022-10-12 LAB — HIV ANTIBODY (ROUTINE TESTING W REFLEX): HIV Screen 4th Generation wRfx: NONREACTIVE

## 2022-10-12 SURGERY — CYSTOSCOPY, WITH RETROGRADE PYELOGRAM AND URETERAL STENT INSERTION
Anesthesia: General | Laterality: Right

## 2022-10-12 MED ORDER — MELATONIN 5 MG PO TABS
5.0000 mg | ORAL_TABLET | Freq: Once | ORAL | Status: AC
  Administered 2022-10-12: 5 mg via ORAL
  Filled 2022-10-12: qty 1

## 2022-10-12 MED ORDER — LIDOCAINE HCL (PF) 2 % IJ SOLN
INTRAMUSCULAR | Status: AC
  Filled 2022-10-12: qty 5

## 2022-10-12 MED ORDER — HYDROMORPHONE HCL 1 MG/ML IJ SOLN: 0.2500 mg | INTRAMUSCULAR | Status: DC | PRN

## 2022-10-12 MED ORDER — OXYBUTYNIN CHLORIDE 5 MG PO TABS
5.0000 mg | ORAL_TABLET | Freq: Three times a day (TID) | ORAL | Status: DC | PRN
  Administered 2022-10-12: 5 mg via ORAL

## 2022-10-12 MED ORDER — FENTANYL CITRATE (PF) 100 MCG/2ML IJ SOLN
INTRAMUSCULAR | Status: AC
  Filled 2022-10-12: qty 2

## 2022-10-12 MED ORDER — POTASSIUM CHLORIDE 2 MEQ/ML IV SOLN
INTRAVENOUS | Status: AC
  Filled 2022-10-12: qty 1000

## 2022-10-12 MED ORDER — PROPOFOL 10 MG/ML IV BOLUS
INTRAVENOUS | Status: DC | PRN
  Administered 2022-10-12: 200 mg via INTRAVENOUS

## 2022-10-12 MED ORDER — ONDANSETRON HCL 4 MG/2ML IJ SOLN
INTRAMUSCULAR | Status: DC | PRN
  Administered 2022-10-12: 4 mg via INTRAVENOUS

## 2022-10-12 MED ORDER — OXYBUTYNIN CHLORIDE 5 MG PO TABS
ORAL_TABLET | ORAL | Status: AC
  Filled 2022-10-12: qty 1

## 2022-10-12 MED ORDER — INSULIN ASPART 100 UNIT/ML IJ SOLN
0.0000 [IU] | Freq: Three times a day (TID) | INTRAMUSCULAR | Status: DC
  Administered 2022-10-12 – 2022-10-13 (×2): 1 [IU] via SUBCUTANEOUS
  Administered 2022-10-13 (×3): 2 [IU] via SUBCUTANEOUS
  Administered 2022-10-14: 1 [IU] via SUBCUTANEOUS
  Administered 2022-10-14 (×2): 2 [IU] via SUBCUTANEOUS
  Administered 2022-10-15: 1 [IU] via SUBCUTANEOUS
  Administered 2022-10-15: 2 [IU] via SUBCUTANEOUS
  Administered 2022-10-15 – 2022-10-16 (×3): 1 [IU] via SUBCUTANEOUS
  Administered 2022-10-16: 2 [IU] via SUBCUTANEOUS
  Administered 2022-10-16 (×2): 1 [IU] via SUBCUTANEOUS
  Administered 2022-10-17: 2 [IU] via SUBCUTANEOUS
  Administered 2022-10-17 – 2022-10-18 (×3): 1 [IU] via SUBCUTANEOUS
  Administered 2022-10-18 (×2): 2 [IU] via SUBCUTANEOUS
  Filled 2022-10-12 (×19): qty 1

## 2022-10-12 MED ORDER — SODIUM CHLORIDE 0.9 % IR SOLN
Status: DC | PRN
  Administered 2022-10-12: 1500 mL

## 2022-10-12 MED ORDER — IOHEXOL 180 MG/ML  SOLN
INTRAMUSCULAR | Status: DC | PRN
  Administered 2022-10-12: 10 mL

## 2022-10-12 MED ORDER — FENTANYL CITRATE (PF) 100 MCG/2ML IJ SOLN
INTRAMUSCULAR | Status: DC | PRN
  Administered 2022-10-12: 25 ug via INTRAVENOUS

## 2022-10-12 MED ORDER — OXYCODONE HCL 5 MG PO TABS: 5.0000 mg | ORAL_TABLET | Freq: Once | ORAL | Status: DC | PRN

## 2022-10-12 MED ORDER — MIDAZOLAM HCL 2 MG/2ML IJ SOLN
INTRAMUSCULAR | Status: DC | PRN
  Administered 2022-10-12: 2 mg via INTRAVENOUS

## 2022-10-12 MED ORDER — TAMSULOSIN HCL 0.4 MG PO CAPS
ORAL_CAPSULE | ORAL | Status: AC
  Filled 2022-10-12: qty 1

## 2022-10-12 MED ORDER — PHENYLEPHRINE 80 MCG/ML (10ML) SYRINGE FOR IV PUSH (FOR BLOOD PRESSURE SUPPORT)
PREFILLED_SYRINGE | INTRAVENOUS | Status: AC
  Filled 2022-10-12: qty 10

## 2022-10-12 MED ORDER — SODIUM CHLORIDE 0.9 % IV SOLN: INTRAVENOUS | Status: DC

## 2022-10-12 MED ORDER — LIDOCAINE HCL (CARDIAC) PF 100 MG/5ML IV SOSY
PREFILLED_SYRINGE | INTRAVENOUS | Status: DC | PRN
  Administered 2022-10-12: 100 mg via INTRAVENOUS

## 2022-10-12 MED ORDER — TAMSULOSIN HCL 0.4 MG PO CAPS
0.4000 mg | ORAL_CAPSULE | Freq: Every day | ORAL | Status: DC
  Administered 2022-10-12 – 2022-10-18 (×7): 0.4 mg via ORAL
  Filled 2022-10-12 (×7): qty 1

## 2022-10-12 MED ORDER — LACTATED RINGERS IV SOLN: INTRAVENOUS | Status: DC | PRN

## 2022-10-12 MED ORDER — LACTATED RINGERS IV BOLUS
500.0000 mL | Freq: Once | INTRAVENOUS | Status: AC
  Administered 2022-10-12: 500 mL via INTRAVENOUS

## 2022-10-12 MED ORDER — PROPOFOL 10 MG/ML IV BOLUS
INTRAVENOUS | Status: AC
  Filled 2022-10-12: qty 20

## 2022-10-12 MED ORDER — OXYCODONE HCL 5 MG/5ML PO SOLN: 5.0000 mg | Freq: Once | ORAL | Status: DC | PRN

## 2022-10-12 MED ORDER — ONDANSETRON HCL 4 MG/2ML IJ SOLN
INTRAMUSCULAR | Status: AC
  Filled 2022-10-12: qty 2

## 2022-10-12 MED ORDER — MIDAZOLAM HCL 2 MG/2ML IJ SOLN
INTRAMUSCULAR | Status: AC
  Filled 2022-10-12: qty 2

## 2022-10-12 MED ORDER — PHENYLEPHRINE HCL (PRESSORS) 10 MG/ML IV SOLN
INTRAVENOUS | Status: DC | PRN
  Administered 2022-10-12 (×2): 80 ug via INTRAVENOUS

## 2022-10-12 SURGICAL SUPPLY — 18 items
BRUSH SCRUB EZ 1% IODOPHOR (MISCELLANEOUS) ×1 IMPLANT
CATH URETL OPEN 5X70 (CATHETERS) IMPLANT
GLOVE SURG UNDER POLY LF SZ7.5 (GLOVE) ×1 IMPLANT
GOWN STRL REUS W/ TWL LRG LVL3 (GOWN DISPOSABLE) ×1 IMPLANT
GOWN STRL REUS W/ TWL XL LVL3 (GOWN DISPOSABLE) ×1 IMPLANT
GOWN STRL REUS W/TWL LRG LVL3 (GOWN DISPOSABLE) ×1
GOWN STRL REUS W/TWL XL LVL3 (GOWN DISPOSABLE) ×1
GUIDEWIRE STR DUAL SENSOR (WIRE) ×1 IMPLANT
IV NS IRRIG 3000ML ARTHROMATIC (IV SOLUTION) ×1 IMPLANT
KIT TURNOVER CYSTO (KITS) ×1 IMPLANT
PACK CYSTO AR (MISCELLANEOUS) ×1 IMPLANT
SET CYSTO W/LG BORE CLAMP LF (SET/KITS/TRAYS/PACK) ×1 IMPLANT
STENT URET 6FRX24 CONTOUR (STENTS) IMPLANT
STENT URET 6FRX26 CONTOUR (STENTS) IMPLANT
SURGILUBE 2OZ TUBE FLIPTOP (MISCELLANEOUS) ×1 IMPLANT
SYR 10ML LL (SYRINGE) ×1 IMPLANT
TRAP FLUID SMOKE EVACUATOR (MISCELLANEOUS) ×1 IMPLANT
WATER STERILE IRR 500ML POUR (IV SOLUTION) ×1 IMPLANT

## 2022-10-12 NOTE — Progress Notes (Signed)
Patient awake to surroundings, aware of being in hospital, needing surgery for "stones"  Also patient states psych saw him this am in pre-op, he states "If it wasn't for her I wouldn't have done surgery" Patient tolerating po fluids without event. Calm and cooperative, asking appropriate questions.

## 2022-10-12 NOTE — ED Notes (Signed)
Pt changed into gown at this time.

## 2022-10-12 NOTE — Transfer of Care (Signed)
Immediate Anesthesia Transfer of Care Note  Patient: Dillon [name]  Procedure(s) Performed: CYSTOSCOPY WITH RETROGRADE PYELOGRAM/URETERAL STENT PLACEMENT (Right)  Patient Location: PACU  Anesthesia Type:General  Level of Consciousness: drowsy  Airway & Oxygen Therapy: Patient Spontanous Breathing and Patient connected to face mask oxygen  Post-op Assessment: Report given to RN and Post -op Vital signs reviewed and stable  Post vital signs: Reviewed and stable  Last Vitals:  Vitals Value Taken Time  BP 104/75 10/12/22 1231  Temp    Pulse 74 10/12/22 1235  Resp 18 10/12/22 1235  SpO2 100 % 10/12/22 1235  Vitals shown include unvalidated device data.  Last Pain:  Vitals:   10/12/22 1027  TempSrc: Temporal  PainSc: 0-No pain         Complications: No notable events documented.

## 2022-10-12 NOTE — Consult Note (Signed)
Urology Consult  Requesting physician: Lindajo Royal, MD  Reason for consultation: Right nephrolithiasis with hydronephrosis   History of Present Illness: Dillon [name] is a 71 y.o. male with a history of depression and OCD who was referred to the ED by his PCP after refusal to eat or drink over the last several weeks for fear of developing diarrhea.  He has had a 20 pound weight loss.  As part of his ED evaluation urinalysis showed 11-20 WBC and lactate was elevated at 2.5.  He was afebrile without leukocytosis.  He has dull right flank pain which has been intermittent for several weeks.  No identifiable precipitating, aggravating or alleviating factors.  No associated signs or symptoms including gross hematuria, frequency, urgency, fever, nausea or vomiting.    A CT abdomen pelvis with contrast was performed which showed a 3 cm partial staghorn calculus obstructing the UPJ.  There was severe right hydronephrosis with renal cortical thinning.  Prior history of uric acid urolithiasis and underwent ureteroscopic stone removal and cystolitholapaxy of a past ureteral calculus by Dr. Achilles Dunk at [facility] January 2017.  Last urologic follow-up was in 2018 and KUB was negative   Past Medical History:  Diagnosis Date   Kidney stone    Meningitis    spinal   OCD (obsessive compulsive disorder)    Shingles     Past Surgical History:  Procedure Laterality Date   KIDNEY STONE SURGERY      Home Medications:  No outpatient medications have been marked as taking for the 10/11/22 encounter [facility] Encounter).    Allergies:  Allergies  Allergen Reactions   Codeine Other (See Comments)    Unsure of reaction    Family History  Problem Relation Age of Onset   Cancer Mother        bladder   Emphysema Father    Emphysema Sister    COPD Sister    Cancer Brother        prostate    Social History:  reports that he has never smoked. He has never used smokeless tobacco. He reports that he  does not drink alcohol and does not use drugs.  ROS: A complete review of systems was performed.  All systems are negative except for pertinent findings as noted.  Physical Exam:  Vital signs in last 24 hours: Temp:  [97.6 F (36.4 C)-97.9 F (36.6 C)] 97.6 F (36.4 C) (12/27 0358) Pulse Rate:  [83-116] 83 (12/27 0358) Resp:  [16-19] 19 (12/27 0358) BP: (147-160)/(97-111) 154/101 (12/27 0358) SpO2:  [96 %-99 %] 96 % (12/27 0358) Weight:  [85.4 kg] 85.4 kg (12/26 1007) Constitutional:  Alert,, No acute distress HEENT: Colp AT, moist mucus membranes.  Trachea midline, no masses Cardiovascular: Regular rate and rhythm Respiratory: Normal respiratory effort, lungs clear bilaterally GI: Abdomen is soft, nontender, nondistended, no abdominal masses GU: No CVA tenderness Skin: No rashes, bruises or suspicious lesions Lymph: No cervical or inguinal adenopathy Neurologic: Grossly intact, no focal deficits, moving all 4 extremities   Laboratory Data:  Recent Labs    10/11/22 1153 10/12/22 0756  WBC 10.0 11.1*  HGB 18.1* 17.2*  HCT 52.4* 50.8   Recent Labs    10/11/22 1022 10/11/22 1153  NA 146* 140  K 3.4* 3.1*  CL 105 102  CO2 21 27  GLUCOSE 171* 178*  BUN 16 18  CREATININE 0.93 1.02  CALCIUM 11.3* 10.4*   No results for input(s): "LABPT", "INR" in the last 72 hours. No  results for input(s): "LABURIN" in the last 72 hours. Results for orders placed or performed during the hospital encounter of 10/11/22  Resp panel by RT-PCR (RSV, Flu A&B, Covid) Anterior Nasal Swab     Status: None   Collection Time: 10/11/22  4:48 PM   Specimen: Anterior Nasal Swab  Result Value Ref Range Status   SARS Coronavirus 2 by RT PCR NEGATIVE NEGATIVE Final    Comment: (NOTE) SARS-CoV-2 target nucleic acids are NOT DETECTED.  The SARS-CoV-2 RNA is generally detectable in upper respiratory specimens during the acute phase of infection. The lowest concentration of SARS-CoV-2 viral copies  this assay can detect is 138 copies/mL. A negative result does not preclude SARS-Cov-2 infection and should not be used as the sole basis for treatment or other patient management decisions. A negative result may occur with  improper specimen collection/handling, submission of specimen other than nasopharyngeal swab, presence of viral mutation(s) within the areas targeted by this assay, and inadequate number of viral copies(<138 copies/mL). A negative result must be combined with clinical observations, patient history, and epidemiological information. The expected result is Negative.  Fact Sheet for Patients:  BloggerCourse.comhttps://www.fda.gov/media/152166/download  Fact Sheet for Healthcare Providers:  SeriousBroker.ithttps://www.fda.gov/media/152162/download  This test is no t yet approved or cleared by the [place] FDA and  has been authorized for detection and/or diagnosis of SARS-CoV-2 by FDA under an Emergency Use Authorization (EUA). This EUA will remain  in effect (meaning this test can be used) for the duration of the COVID-19 declaration under Section 564(b)(1) of the Act, 21 U.S.C.section 360bbb-3(b)(1), unless the authorization is terminated  or revoked sooner.       Influenza A by PCR NEGATIVE NEGATIVE Final   Influenza B by PCR NEGATIVE NEGATIVE Final    Comment: (NOTE) The Xpert Xpress SARS-CoV-2/FLU/RSV plus assay is intended as an aid in the diagnosis of influenza from Nasopharyngeal swab specimens and should not be used as a sole basis for treatment. Nasal washings and aspirates are unacceptable for Xpert Xpress SARS-CoV-2/FLU/RSV testing.  Fact Sheet for Patients: BloggerCourse.comhttps://www.fda.gov/media/152166/download  Fact Sheet for Healthcare Providers: SeriousBroker.ithttps://www.fda.gov/media/152162/download  This test is not yet approved or cleared by the [place] FDA and has been authorized for detection and/or diagnosis of SARS-CoV-2 by FDA under an Emergency Use Authorization (EUA). This EUA  will remain in effect (meaning this test can be used) for the duration of the COVID-19 declaration under Section 564(b)(1) of the Act, 21 U.S.C. section 360bbb-3(b)(1), unless the authorization is terminated or revoked.     Resp Syncytial Virus by PCR NEGATIVE NEGATIVE Final    Comment: (NOTE) Fact Sheet for Patients: BloggerCourse.comhttps://www.fda.gov/media/152166/download  Fact Sheet for Healthcare Providers: SeriousBroker.ithttps://www.fda.gov/media/152162/download  This test is not yet approved or cleared by the [place] FDA and has been authorized for detection and/or diagnosis of SARS-CoV-2 by FDA under an Emergency Use Authorization (EUA). This EUA will remain in effect (meaning this test can be used) for the duration of the COVID-19 declaration under Section 564(b)(1) of the Act, 21 U.S.C. section 360bbb-3(b)(1), unless the authorization is terminated or revoked.  Performed at [facility], [address] [place], [place] [address]      Radiologic Imaging: CT images were personally reviewed and interpreted  CT ABDOMEN PELVIS W CONTRAST  Result Date: 10/11/2022 CLINICAL DATA:  Abdominal pain, decreased appetite EXAM: CT ABDOMEN AND PELVIS WITH CONTRAST TECHNIQUE: Multidetector CT imaging of the abdomen and pelvis was performed using the standard protocol following bolus administration of intravenous contrast. RADIATION  DOSE REDUCTION: This exam was performed according to the departmental dose-optimization program which includes automated exposure control, adjustment of the mA and/or kV according to patient size and/or use of iterative reconstruction technique. CONTRAST:  OMNIPAQUE IOHEXOL 300 MG/ML  SOLN COMPARISON:  01/05/2016 FINDINGS: Lower chest: No acute pleural or parenchymal lung disease. Hepatobiliary: Multiple calcified and noncalcified gallstones fill the gallbladder lumen. No gallbladder wall thickening or pericholecystic fluid. The liver is unremarkable. No biliary duct  dilation or choledocholithiasis. Pancreas: Unremarkable. No pancreatic ductal dilatation or surrounding inflammatory changes. Spleen: Normal in size without focal abnormality. Adrenals/Urinary Tract: Obstructing staghorn calculus at the right UPJ, measuring up to 3.0 cm in size. There is severe right-sided hydronephrosis with marked right renal cortical thinning suggesting longstanding obstruction. Fat stranding surround the proximal right ureter could reflect superimposed infection. Left kidney enhances normally. Benign 1.2 cm simple cyst posterior mid left kidney does not require follow-up. No left-sided obstruction. The adrenals are stable. The bladder is decompressed, with no gross abnormality. Stomach/Bowel: No bowel obstruction or ileus. Diffuse colonic diverticulosis without diverticulitis. Normal appendix right lower quadrant. No bowel wall thickening or inflammatory change. Vascular/Lymphatic: Aortic atherosclerosis. No enlarged abdominal or pelvic lymph nodes. Reproductive: Continued enlargement of the prostate. Other: No free fluid or free intraperitoneal gas. Fat containing left inguinal hernia. No bowel herniation. Musculoskeletal: No acute or destructive bony lesions. Reconstructed images demonstrate no additional findings. IMPRESSION: 1. Obstructing right renal staghorn calculus, measuring up to 3 cm in size. Severe right-sided hydronephrosis with associated right renal cortical thinning, suggesting longstanding obstruction. Fat stranding surrounding the right renal pelvis and proximal right ureter could reflect superimposed infection. Please correlate with urinalysis. 2. Diffuse colonic diverticulosis without diverticulitis. 3. Cholelithiasis without cholecystitis. 4. Stable enlarged prostate. 5.  Aortic Atherosclerosis (ICD10-I70.0). Electronically Signed   By: Sharlet Salina M.D.   On: 10/11/2022 18:55   CT HEAD WO CONTRAST ( )  Result Date: 10/11/2022 CLINICAL DATA:  Mental status change  EXAM: CT HEAD WITHOUT CONTRAST TECHNIQUE: Contiguous axial images were obtained from the base of the skull through the vertex without intravenous contrast. RADIATION DOSE REDUCTION: This exam was performed according to the departmental dose-optimization program which includes automated exposure control, adjustment of the mA and/or kV according to patient size and/or use of iterative reconstruction technique. COMPARISON:  CT scan of the brain January 11, 2016 FINDINGS: Brain: No evidence of acute infarction, hemorrhage, hydrocephalus, extra-axial collection or mass lesion/mass effect. Mild white matter changes identified. Vascular: No hyperdense vessel or unexpected calcification. Skull: Normal. Negative for fracture or focal lesion. Sinuses/Orbits: Paranasal sinuses are normal. Opacification of inferior right mastoid air cells identified, similar since the January 11, 2016 comparison. Significant opacification of the left sided mastoid air cells, also a stable finding. No bony erosion. The middle ears are well aerated. Other: No other abnormalities. IMPRESSION: 1. No acute intracranial abnormalities. 2. Chronic white matter changes. 3. Chronic opacification of the bilateral mastoid air cells. Electronically Signed   By: Gerome Sam III M.D.   On: 10/11/2022 16:23    Impression/Assessment:  71 y.o. male with a large, partial staghorn calculus obstructing the right UPJ.  He has cortical thinning indicating longstanding obstruction   Recommendation:  Recommend cystoscopy with placement right ureteral stent and an attempt to relieve obstruction and preserve renal function The procedure was discussed in detail including potential risks of bleeding, infection/sepsis and ureteral injury.  Low possibility of an impacted stone and unsuccessful stent placement which would require percutaneous nephrostomy was reviewed  The alternative of doing nothing was discussed however over time would potentially develop a  nonfunctioning kidney which could be complicated by infection/XGP We discussed the stone will not be treated and he will need follow-up stone treatment either PCNL or staged ureteroscopy.  His loss of renal parenchyma may make PCNL difficult He requested I talk with either his son or daughter however their numbers in Epic are not current.  ED staff attempting to contact   10/12/2022, 8:24 AM  Irineo Axon,  MD

## 2022-10-12 NOTE — ED Notes (Signed)
Patient CBG is 164.

## 2022-10-12 NOTE — Anesthesia Procedure Notes (Signed)
Procedure Name: LMA Insertion Date/Time: 10/12/2022 11:55 AM  Performed by: Elmarie Mainland, CRNAPre-anesthesia Checklist: Patient identified, Emergency Drugs available, Suction available and Patient being monitored Patient Re-evaluated:Patient Re-evaluated prior to induction Oxygen Delivery Method: Circle system utilized Preoxygenation: Pre-oxygenation with 100% oxygen Induction Type: IV induction LMA: LMA inserted LMA Size: 5.0 Number of attempts: 1 Placement Confirmation: positive ETCO2 and breath sounds checked- equal and bilateral Tube secured with: Tape Dental Injury: Teeth and Oropharynx as per pre-operative assessment

## 2022-10-12 NOTE — Progress Notes (Incomplete)
       CROSS COVER NOTE  NAME: Dillon [name] MRN: [id number] DOB : Nov 26, 1950 ATTENDING PHYSICIAN: Marinda Elk, MD    Date of Service   10/12/2022   HPI/Events of Note   Sleep aid, prolonged qtc  Interventions   Assessment/Plan: Melatonin X X        To reach the provider On-Call:   7AM- 7PM see care teams to locate the attending and reach out to them via [email or web address]. 7PM-7AM contact night-coverage If you still have difficulty reaching the appropriate provider, please page the [facility] (Director on Call) for [facility] on amion for assistance  This document was prepared using Sales executive software and may include unintentional dictation errors.  Bishop Limbo DNP, MBA, FNP-BC Nurse Practitioner [facility] Pager [phone]

## 2022-10-12 NOTE — Anesthesia Preprocedure Evaluation (Addendum)
Anesthesia Evaluation  Patient identified by MRN, date of birth, ID band Patient awake    Reviewed: Allergy & Precautions, NPO status , Patient's Chart, lab work & pertinent test results  History of Anesthesia Complications Negative for: history of anesthetic complications  Airway Mallampati: III  TM Distance: >3 FB Neck ROM: full    Dental  (+) Chipped   Pulmonary neg pulmonary ROS   Pulmonary exam normal        Cardiovascular negative cardio ROS Normal cardiovascular exam     Neuro/Psych  PSYCHIATRIC DISORDERS Anxiety Depression    negative neurological ROS     GI/Hepatic negative GI ROS, Neg liver ROS,,,  Endo/Other  diabetes, Poorly Controlled, Oral Hypoglycemic Agents    Renal/GU Renal disease     Musculoskeletal   Abdominal   Peds  Hematology negative hematology ROS (+)   Anesthesia Other Findings Past Medical History: No date: Kidney stone No date: Meningitis     Comment:  spinal No date: OCD (obsessive compulsive disorder) No date: Shingles  Past Surgical History: No date: KIDNEY STONE SURGERY     Reproductive/Obstetrics negative OB ROS                              Anesthesia Physical Anesthesia Plan  ASA: 3  Anesthesia Plan: General LMA   Post-op Pain Management: Toradol IV (intra-op)* and Ofirmev IV (intra-op)*   Induction: Intravenous  PONV Risk Score and Plan: 2 and Dexamethasone, Ondansetron, Midazolam and Treatment may vary due to age or medical condition  Airway Management Planned: LMA  Additional Equipment:   Intra-op Plan:   Post-operative Plan: Extubation in OR  Informed Consent: I have reviewed the patients History and Physical, chart, labs and discussed the procedure including the risks, benefits and alternatives for the proposed anesthesia with the patient or authorized representative who has indicated his/her understanding and acceptance.      Dental Advisory Given  Plan Discussed with: Anesthesiologist, CRNA and Surgeon  Anesthesia Plan Comments: (Patient consented for risks of anesthesia including but not limited to:  - adverse reactions to medications - damage to eyes, teeth, lips or other oral mucosa - nerve damage due to positioning  - sore throat or hoarseness - Damage to heart, brain, nerves, lungs, other parts of body or loss of life  Patient voiced understanding.)         Anesthesia Quick Evaluation  

## 2022-10-12 NOTE — Consult Note (Signed)
Dr. Lonna Cobb sent writer secure chat at approx 1100 requesting that psych see patient to assess capacity to make decision to decline surgery because he is refusing it at this time. This Clinical research associate saw patient yesterday for the original psych consult. Writer went to speak with patient, explaining the need for surgery and the fact that it is truly a necessary step for his healing. He is concerned that we are going to "make him eat and drink." He is adamant that he is not trying to be a problem but he simply can't do it. Writer provided compassionate listening and understanding. Explained that this surgery is just a step; we won't worry about eating and drinking just now, but he needs this surgery to have any sort of hope of recovery.  Patient states that he will go through with the surgery.  Patient admits that he does not like to feel like he can't eat and drink. He understands that he will die if he stops all nutrition/fluids and he did express some hope when we talked about how he got better in the past with ECT. He says he is worried "What if it doesn't work"  but after discussion he agrees that he has a little hope that he will be helped by psych admission and ECT. Writer told patient that I would ask Dr. Toni Amend to see him and talk to him about ECT.   He understands that he cannot go to the psych unit without the surgery.   Vanetta Mulders, PMHNP

## 2022-10-12 NOTE — Progress Notes (Signed)
[facility] PROGRESS NOTE  Dillon BlacksmithRobert Henry [name] UJW:[id number] DOB: 1950-10-28 DOA: 10/11/2022 PCP: Dillon Skiffannady, Jolene T, NP  Status: Remains inpatient appropriate because: Patient admitted with 2 issues: Obstructive nephropathy in context of staghorn calculus causing right hydronephrosis patient is now status post stent.  Patient also had issues with severe OCD exacerbation presenting as adult failure to thrive and weight loss.  Patient would not eat because of fear of having diarrhea.  Psychiatry has evaluated the patient and has recommended behavioral health inpatient treatment once patient is medically stable.  Level of care: Med-Surg   Code Status: Full Family Communication: Patient only DVT prophylaxis: Patient currently ambulatory and paces in the room.  Immediately postprocedure for stent placement for hydronephrosis.  Will need to defer to urology before initiating DVT prophylaxis pharmacotherapy  HPI: 71 year old male with history of diabetes, depression and OCD with prior hospitalizations and ECT.  He has had ongoing issues with refusal to eat due to fear of having unwanted diarrhea.  Apparently because of this he has lost 20 pounds.  He did complain of right flank pain.  Because of all of this he was brought to the ER for evaluation.  He was mildly hypertensive he had elevated lactic acid 2.5 with a concentrated hemoglobin of 18.1 and elevated calcium of 10.4.  Creatinine was actually normal.  Potassium low.  He was also found have a prolonged QTc of 631 ms on admission EKG.  Imaging did reveal staghorn calculus with severe right-sided hydronephrosis and fat stranding.  Dr. Lonna CobbStoioff with urology was consulted and patient has subsequently undergone stent placement on 12/27.  Plan is to stabilize patient from a medical standpoint with subsequent admission to behavioral health once medically stable.  Subjective: Patient is alert and walking around in his room preprocedure.  Does have some  difficulty maintaining eye contact.  Concerns about timing of retrieval and/or lithotripsy of staghorn calculus.  I did discuss with the patient that because he has not eaten for a long period of time that once he does eat food he will definitely have diarrhea and to expect this.  I also informed him that once he begins to eat regularly the diarrhea will resolve.  Patient was having difficulty maintaining topic during interview.  At the end of interview staff came to take patient to the operative suite for his procedure.  Objective: Vitals:   10/11/22 2252 10/12/22 0358  BP:  (!) 154/101  Pulse: 85 83  Resp:  19  Temp:  97.6 F (36.4 C)  SpO2: 97% 96%   No intake or output data in the 24 hours ending 10/12/22 0822 There were no vitals filed for this visit.  Exam:  Constitutional: NAD, anxious, comfortable Respiratory: clear to auscultation bilaterally, no wheezing, no crackles. Normal respiratory effort. No accessory muscle use.  Cardiovascular: Regular rate and rhythm, no murmurs / rubs / gallops. No extremity edema. 2+ pedal pulses.  Abdomen: no tenderness, not distended but with normoactive bowel sounds.  No masses palpated.  Musculoskeletal: no clubbing / cyanosis. No joint deformity upper and lower extremities. Good ROM, no contractures. Normal muscle tone.  Skin: no rashes, lesions, ulcers. No induration Neurologic: CN 2-12 grossly intact. Sensation intact, DTR normal. Strength 5/5 x all 4 extremities.  Psychiatric: Alert and oriented x 3.  Anxious mood.    Assessment/Plan: Suspected pyelonephritis/Staghorn calculus with right hydronephrosis Abnormal urinalysis, tachycardia with lactic acidosis Suspect ketosis and elevated lactic acid secondary to dehydration and starvation ketosis Suspected stranding seen on CT  more consistent with an appropriate inflammatory response to staghorn calculus and obstructive process After IV hydration lactic acid has decreased to 1.1, serum CO2 has  normalized at 26 and hemoglobin has decreased to 17.2. Continue Rocephin and follow cultures Continue Flomax Patient underwent cystoscopy with right ureteral stent placement and right retrograde pyelogram on 12/27 by Dr. Lonna Cobb -from a urological standpoint patient has been approved for transfer to inpatient psych on 10/13/2022  Prolonged QTc Greater than 600 ms on admission EKG Repeat EKG and continue to replace potassium as well as magnesium and/or phosphorus if necessary Continue telemetry Avoid QT prolonging agents such as Phenergan/Zofran, quinolones or Haldol  Major depressive disorder, recurrent episode, severe (HCC) OCD Patient has not been eating due to fear of developing diarrhea Evaluated by psychiatry who will admit patient to their service after medical stabilization Continue Luvox   Inadequate oral intake/starvation ketosis Patient reports not eating or drinking for fear of developing diarrhea But improved after IV hydration Nutritionist consult ending Psych evaluated patient in the ED and will admit once medically stabilized Continue lactated Ringer's infusion with potassium supplementation Magnesium and phosphorus   Hypokalemia Potassium remains slightly low at 3.3 so we will continue IV replacement   Diabetes mellitus, type II (HCC) Sliding scale insulin Continue Tradjenta Hemoglobin A1c pending  Hypertension Continue Zestril    Data Reviewed: Basic Metabolic Panel: Recent Labs  Lab 10/11/22 1022 10/11/22 1153  NA 146* 140  K 3.4* 3.1*  CL 105 102  CO2 21 27  GLUCOSE 171* 178*  BUN 16 18  CREATININE 0.93 1.02  CALCIUM 11.3* 10.4*   Liver Function Tests: Recent Labs  Lab 10/11/22 1153  AST 37  ALT 60*  ALKPHOS 106  BILITOT 2.3*  PROT 7.3  ALBUMIN 4.8   Recent Labs  Lab 10/11/22 1153  LIPASE 33   No results for input(s): "AMMONIA" in the last 168 hours. CBC: Recent Labs  Lab 10/11/22 1153 10/12/22 0756  WBC 10.0 11.1*   NEUTROABS 6.6  --   HGB 18.1* 17.2*  HCT 52.4* 50.8  MCV 89.7 90.4  PLT 182 162   Cardiac Enzymes: No results for input(s): "CKTOTAL", "CKMB", "CKMBINDEX", "TROPONINI" in the last 168 hours. BNP (last 3 results) No results for input(s): "BNP" in the last 8760 hours.  ProBNP (last 3 results) No results for input(s): "PROBNP" in the last 8760 hours.  CBG: Recent Labs  Lab 10/11/22 1817 10/11/22 2203  GLUCAP 146* 133*    Recent Results (from the past 240 hour(s))  Resp panel by RT-PCR (RSV, Flu A&B, Covid) Anterior Nasal Swab     Status: None   Collection Time: 10/11/22  4:48 PM   Specimen: Anterior Nasal Swab  Result Value Ref Range Status   SARS Coronavirus 2 by RT PCR NEGATIVE NEGATIVE Final    Comment: (NOTE) SARS-CoV-2 target nucleic acids are NOT DETECTED.  The SARS-CoV-2 RNA is generally detectable in upper respiratory specimens during the acute phase of infection. The lowest concentration of SARS-CoV-2 viral copies this assay can detect is 138 copies/mL. A negative result does not preclude SARS-Cov-2 infection and should not be used as the sole basis for treatment or other patient management decisions. A negative result may occur with  improper specimen collection/handling, submission of specimen other than nasopharyngeal swab, presence of viral mutation(s) within the areas targeted by this assay, and inadequate number of viral copies(<138 copies/mL). A negative result must be combined with clinical observations, patient history, and epidemiological information. The expected  result is Negative.  Fact Sheet for Patients:  BloggerCourse.com  Fact Sheet for Healthcare Providers:  SeriousBroker.it  This test is no [name] yet approved or cleared by the [place] FDA and  has been authorized for detection and/or diagnosis of SARS-CoV-2 by FDA under an Emergency Use Authorization (EUA). This EUA will remain  in  effect (meaning this test can be used) for the duration of the COVID-19 declaration under Section 564(b)(1) of the Act, 21 U.S.C.section 360bbb-3(b)(1), unless the authorization is terminated  or revoked sooner.       Influenza A by PCR NEGATIVE NEGATIVE Final   Influenza B by PCR NEGATIVE NEGATIVE Final    Comment: (NOTE) The Xpert Xpress SARS-CoV-2/FLU/RSV plus assay is intended as an aid in the diagnosis of influenza from Nasopharyngeal swab specimens and should not be used as a sole basis for treatment. Nasal washings and aspirates are unacceptable for Xpert Xpress SARS-CoV-2/FLU/RSV testing.  Fact Sheet for Patients: BloggerCourse.com  Fact Sheet for Healthcare Providers: SeriousBroker.it  This test is not yet approved or cleared by the [place] FDA and has been authorized for detection and/or diagnosis of SARS-CoV-2 by FDA under an Emergency Use Authorization (EUA). This EUA will remain in effect (meaning this test can be used) for the duration of the COVID-19 declaration under Section 564(b)(1) of the Act, 21 U.S.C. section 360bbb-3(b)(1), unless the authorization is terminated or revoked.     Resp Syncytial Virus by PCR NEGATIVE NEGATIVE Final    Comment: (NOTE) Fact Sheet for Patients: BloggerCourse.com  Fact Sheet for Healthcare Providers: SeriousBroker.it  This test is not yet approved or cleared by the [place] FDA and has been authorized for detection and/or diagnosis of SARS-CoV-2 by FDA under an Emergency Use Authorization (EUA). This EUA will remain in effect (meaning this test can be used) for the duration of the COVID-19 declaration under Section 564(b)(1) of the Act, 21 U.S.C. section 360bbb-3(b)(1), unless the authorization is terminated or revoked.  Performed at [facility], [address] [place], [place] [address]       Studies: CT ABDOMEN PELVIS W CONTRAST  Result Date: 10/11/2022 CLINICAL DATA:  Abdominal pain, decreased appetite EXAM: CT ABDOMEN AND PELVIS WITH CONTRAST TECHNIQUE: Multidetector CT imaging of the abdomen and pelvis was performed using the standard protocol following bolus administration of intravenous contrast. RADIATION DOSE REDUCTION: This exam was performed according to the departmental dose-optimization program which includes automated exposure control, adjustment of the mA and/or kV according to patient size and/or use of iterative reconstruction technique. CONTRAST:  OMNIPAQUE IOHEXOL 300 MG/ML  SOLN COMPARISON:  01/05/2016 FINDINGS: Lower chest: No acute pleural or parenchymal lung disease. Hepatobiliary: Multiple calcified and noncalcified gallstones fill the gallbladder lumen. No gallbladder wall thickening or pericholecystic fluid. The liver is unremarkable. No biliary duct dilation or choledocholithiasis. Pancreas: Unremarkable. No pancreatic ductal dilatation or surrounding inflammatory changes. Spleen: Normal in size without focal abnormality. Adrenals/Urinary Tract: Obstructing staghorn calculus at the right UPJ, measuring up to 3.0 cm in size. There is severe right-sided hydronephrosis with marked right renal cortical thinning suggesting longstanding obstruction. Fat stranding surround the proximal right ureter could reflect superimposed infection. Left kidney enhances normally. Benign 1.2 cm simple cyst posterior mid left kidney does not require follow-up. No left-sided obstruction. The adrenals are stable. The bladder is decompressed, with no gross abnormality. Stomach/Bowel: No bowel obstruction or ileus. Diffuse colonic diverticulosis without diverticulitis. Normal appendix right lower quadrant. No bowel wall thickening or inflammatory change. Vascular/Lymphatic: Aortic  atherosclerosis. No enlarged abdominal or pelvic lymph nodes. Reproductive: Continued enlargement of the  prostate. Other: No free fluid or free intraperitoneal gas. Fat containing left inguinal hernia. No bowel herniation. Musculoskeletal: No acute or destructive bony lesions. Reconstructed images demonstrate no additional findings. IMPRESSION: 1. Obstructing right renal staghorn calculus, measuring up to 3 cm in size. Severe right-sided hydronephrosis with associated right renal cortical thinning, suggesting longstanding obstruction. Fat stranding surrounding the right renal pelvis and proximal right ureter could reflect superimposed infection. Please correlate with urinalysis. 2. Diffuse colonic diverticulosis without diverticulitis. 3. Cholelithiasis without cholecystitis. 4. Stable enlarged prostate. 5.  Aortic Atherosclerosis (ICD10-I70.0). Electronically Signed   By: Sharlet Salina M.D.   On: 10/11/2022 18:55   CT HEAD WO CONTRAST ( )  Result Date: 10/11/2022 CLINICAL DATA:  Mental status change EXAM: CT HEAD WITHOUT CONTRAST TECHNIQUE: Contiguous axial images were obtained from the base of the skull through the vertex without intravenous contrast. RADIATION DOSE REDUCTION: This exam was performed according to the departmental dose-optimization program which includes automated exposure control, adjustment of the mA and/or kV according to patient size and/or use of iterative reconstruction technique. COMPARISON:  CT scan of the brain January 11, 2016 FINDINGS: Brain: No evidence of acute infarction, hemorrhage, hydrocephalus, extra-axial collection or mass lesion/mass effect. Mild white matter changes identified. Vascular: No hyperdense vessel or unexpected calcification. Skull: Normal. Negative for fracture or focal lesion. Sinuses/Orbits: Paranasal sinuses are normal. Opacification of inferior right mastoid air cells identified, similar since the January 11, 2016 comparison. Significant opacification of the left sided mastoid air cells, also a stable finding. No bony erosion. The middle ears are well aerated.  Other: No other abnormalities. IMPRESSION: 1. No acute intracranial abnormalities. 2. Chronic white matter changes. 3. Chronic opacification of the bilateral mastoid air cells. Electronically Signed   By: Gerome Sam III M.D.   On: 10/11/2022 16:23    Scheduled Meds:  cyanocobalamin  1,000 mcg Oral Daily   fluvoxaMINE  25 mg Oral QHS   linagliptin  5 mg Oral Daily   lisinopril  5 mg Oral Daily   rosuvastatin  10 mg Oral Daily   [START ON 10/14/2022] Vitamin D (Ergocalciferol)  50,000 Units Oral Q7 days   Continuous Infusions:  cefTRIAXone (ROCEPHIN)  IV      Principal Problem:   Acute pyelonephritis Active Problems:   Staghorn calculus   Hypokalemia   OCD (obsessive compulsive disorder)   Major depressive disorder, recurrent episode, severe (HCC)   Diabetes mellitus, type II (HCC)   Anxiety state   Inadequate oral intake   Ketonuria   Hydronephrosis of right kidney   Consultants: Urology Psychiatry  Procedures: Cystoscopy with stent placement on 12/27  Antibiotics: Ceftriaxone 12/26 >>    Time spent: 35 minutes    Junious Silk ANP  [facility] 7 am - 330 pm/M-F for direct patient care and secure chat Please refer to Amion for contact info 1  days

## 2022-10-12 NOTE — Progress Notes (Signed)
In hospital, will review with him next visit.

## 2022-10-12 NOTE — Progress Notes (Signed)
HOSPITAL MEDICINE EVENT NOTE    Notfied by nursing that upon arrival to the medical floor this afternoon he has been tachycardic.   EKG obtained revealing normal sinus rhythm at 108 bpm.  Per nursing assessment patient has been experiencing flank pain consistent with his staghorn calculus.  Tachycardia likely secondary to pain in the setting of volume depletion as patient has been reluctant to take p.o. intake.  Initiating 500 cc LR bolus followed by 100 cc an hour of lactated Ringer's.  Administering as needed opiate-based analgesics for pain control.  Continue to monitor on telemetry.  Is also worth noting the patient continues to have a somewhat prolonged QTc.  Avoid QT prolonging agents.  Magnesium obtained this afternoon is 1.8.  Patient is receiving intravenous potassium chloride supplementation for his mild hypokalemia.  Marinda Elk  MD [facility]

## 2022-10-12 NOTE — Op Note (Signed)
   Preoperative diagnosis:  Right partial staghorn calculus Severe hydronephrosis secondary to above  Postoperative diagnosis:  Same  Procedure:  Cystoscopy Right ureteral stent placement (6735F/26 cm) Right retrograde pyelography with interpretation   Surgeon: Lorin Picket C. Sue Fernicola, M.D.  Anesthesia: General  Complications: None  Intraoperative findings:  Cystoscopy: Wide caliber bulbar urethral stricture which did not impede passage of the 21 French cystoscope.  Moderate lateral lobe enlargement.  Mild bladder trabeculation.  Mucosa without solid or papillary lesions.  UOs normal-appearing bilaterally Right retrograde pyelogram: Persistent nephrogram and right hydronephrosis after contrast administration 10/11/2022.  Dilated calyces with a partial staghorn right renal pelvis  EBL: Minimal  Specimens: Urine right renal pelvis for culture  Indication: Dillon [name] is a 70 y.o. presented to the ED yesterday for psychiatric evaluation.  As part of his evaluation he had pyuria and an elevated lactate.  CT was performed which showed a 3 cm partial staghorn obstructing at the renal pelvis with severe hydronephrosis and renal parenchymal thinning.  He is not medically cleared at this time for inpatient psychiatric admission and stent placement was recommended.  After reviewing the management options for treatment, he elected to proceed with the above surgical procedure(s). We have discussed the potential benefits and risks of the procedure, side effects of the proposed treatment, the likelihood of the patient achieving the goals of the procedure, and any potential problems that might occur during the procedure or recuperation. Informed consent has been obtained.  Description of procedure:  The patient was taken to the operating room and general anesthesia was induced.  The patient was placed in the dorsal lithotomy position, prepped and draped in the usual sterile fashion, and preoperative  antibiotics were administered. A preoperative time-out was performed.   A 21 French cystoscope was lubricated, passed per urethra and advanced proximally under direct vision.  Panendoscopy was then performed with findings as described above.  Attention then turned to the right ureteral orifice and 0.038 Sensor wire was placed into the right UO and advanced superior to the calculus under fluoroscopic guidance.  A 735F open ended ureteral catheter was advanced over the wire.  Once the catheter was superior to the calculus the guidewire was removed and 10 mL of clear urine was aspirated and sent for culture.  Omnipaque contrast was injected through the ureteral catheter and a retrograde pyelogram was performed with findings as dictated above.  The guidewire was replaced with the tip positioned in a dilated midpole calyx.  The ureteral catheter was removed and a 6735F/26 cm Contour ureteral stent was advanced over the guidewire.  A good curl was noted in the dilated calyx under fluoroscopy and the distal and the stent was well-positioned in the bladder.  The bladder was then emptied and the cystoscope was removed. The patient appeared to tolerate the procedure well and without complications.  After anesthetic reversal he was transported to the PACU in stable condition.   Recommendation: Unless he has a significant temp spike should be okay for transfer to inpatient psych 10/13/2022   Irineo Axon, MD

## 2022-10-13 ENCOUNTER — Encounter: Payer: Self-pay | Admitting: Urology

## 2022-10-13 DIAGNOSIS — D696 Thrombocytopenia, unspecified: Secondary | ICD-10-CM | POA: Insufficient documentation

## 2022-10-13 DIAGNOSIS — N200 Calculus of kidney: Secondary | ICD-10-CM

## 2022-10-13 DIAGNOSIS — F332 Major depressive disorder, recurrent severe without psychotic features: Secondary | ICD-10-CM

## 2022-10-13 DIAGNOSIS — N10 Acute tubulo-interstitial nephritis: Secondary | ICD-10-CM | POA: Diagnosis not present

## 2022-10-13 LAB — GLUCOSE, CAPILLARY
Glucose-Capillary: 153 mg/dL — ABNORMAL HIGH (ref 70–99)
Glucose-Capillary: 159 mg/dL — ABNORMAL HIGH (ref 70–99)
Glucose-Capillary: 171 mg/dL — ABNORMAL HIGH (ref 70–99)
Glucose-Capillary: 135 mg/dL — ABNORMAL HIGH (ref 70–99)

## 2022-10-13 LAB — URINE CULTURE
Culture: NO GROWTH
Culture: NO GROWTH

## 2022-10-13 LAB — COMPREHENSIVE METABOLIC PANEL
ALT: 46 U/L — ABNORMAL HIGH (ref 0–44)
AST: 21 U/L (ref 15–41)
Albumin: 3.7 g/dL (ref 3.5–5.0)
Alkaline Phosphatase: 92 U/L (ref 38–126)
Anion gap: 9 (ref 5–15)
BUN: 13 mg/dL (ref 8–23)
CO2: 24 mmol/L (ref 22–32)
Calcium: 9.9 mg/dL (ref 8.9–10.3)
Chloride: 109 mmol/L (ref 98–111)
Creatinine, Ser: 0.85 mg/dL (ref 0.61–1.24)
GFR, Estimated: >60 mL/min (ref >60)
Glucose, Bld: 158 mg/dL — ABNORMAL HIGH (ref 70–99)
Potassium: 3.1 mmol/L — ABNORMAL LOW (ref 3.5–5.1)
Sodium: 142 mmol/L (ref 135–145)
Total Bilirubin: 1.4 mg/dL — ABNORMAL HIGH (ref 0.3–1.2)
Total Protein: 5.9 g/dL — ABNORMAL LOW (ref 6.5–8.1)

## 2022-10-13 LAB — CBC WITH DIFFERENTIAL/PLATELET
Abs Immature Granulocytes: 0.04 10*3/uL (ref 0.00–0.07)
Basophils Absolute: 0 10*3/uL (ref 0.0–0.1)
Basophils Relative: 0 %
Eosinophils Absolute: 0.1 10*3/uL (ref 0.0–0.5)
Eosinophils Relative: 1 %
HCT: 43.7 % (ref 39.0–52.0)
Hemoglobin: 15.5 g/dL (ref 13.0–17.0)
Immature Granulocytes: 0 %
Lymphocytes Relative: 21 %
Lymphs Abs: 2.1 10*3/uL (ref 0.7–4.0)
MCH: 31.4 pg (ref 26.0–34.0)
MCHC: 35.5 g/dL (ref 30.0–36.0)
MCV: 88.6 fL (ref 80.0–100.0)
Monocytes Absolute: 0.9 10*3/uL (ref 0.1–1.0)
Monocytes Relative: 9 %
Neutro Abs: 6.8 10*3/uL (ref 1.7–7.7)
Neutrophils Relative %: 69 %
Platelets: 137 10*3/uL — ABNORMAL LOW (ref 150–400)
RBC: 4.93 MIL/uL (ref 4.22–5.81)
RDW: 12.8 % (ref 11.5–15.5)
WBC: 9.9 10*3/uL (ref 4.0–10.5)
nRBC: 0 % (ref 0.0–0.2)

## 2022-10-13 LAB — HEMOGLOBIN A1C
Hgb A1c MFr Bld: 6.3 % — ABNORMAL HIGH (ref 4.8–5.6)
Mean Plasma Glucose: 134 mg/dL

## 2022-10-13 LAB — MAGNESIUM: Magnesium: 1.9 mg/dL (ref 1.7–2.4)

## 2022-10-13 LAB — PHOSPHORUS: Phosphorus: 2.5 mg/dL (ref 2.5–4.6)

## 2022-10-13 MED ORDER — BANATROL TF EN LIQD: 60.0000 mL | Freq: Two times a day (BID) | ENTERAL | Status: DC

## 2022-10-13 MED ORDER — POTASSIUM CHLORIDE 10 MEQ/100ML IV SOLN
10.0000 meq | INTRAVENOUS | Status: AC
  Administered 2022-10-13 (×2): 10 meq via INTRAVENOUS
  Filled 2022-10-13 (×2): qty 100

## 2022-10-13 MED ORDER — FLUVOXAMINE MALEATE 50 MG PO TABS
100.0000 mg | ORAL_TABLET | Freq: Every day | ORAL | Status: DC
  Administered 2022-10-13 – 2022-10-18 (×6): 100 mg via ORAL
  Filled 2022-10-13 (×6): qty 2

## 2022-10-13 MED ORDER — GLUCERNA SHAKE PO LIQD
237.0000 mL | Freq: Two times a day (BID) | ORAL | Status: DC
  Administered 2022-10-14: 237 mL via ORAL

## 2022-10-13 MED ORDER — ENSURE MAX PROTEIN PO LIQD
11.0000 [oz_av] | Freq: Every day | ORAL | Status: DC
  Administered 2022-10-13 – 2022-10-18 (×5): 11 [oz_av] via ORAL

## 2022-10-13 MED ORDER — ADULT MULTIVITAMIN W/MINERALS CH
1.0000 | ORAL_TABLET | Freq: Every day | ORAL | Status: DC
  Administered 2022-10-14 – 2022-10-18 (×5): 1 via ORAL
  Filled 2022-10-13 (×5): qty 1

## 2022-10-13 MED ORDER — GLUCERNA SHAKE PO LIQD
237.0000 mL | Freq: Two times a day (BID) | ORAL | Status: DC
  Administered 2022-10-13: 237 mL via ORAL

## 2022-10-13 MED ORDER — OLANZAPINE 5 MG PO TABS
5.0000 mg | ORAL_TABLET | Freq: Every day | ORAL | Status: DC
  Administered 2022-10-13 – 2022-10-18 (×6): 5 mg via ORAL
  Filled 2022-10-13 (×6): qty 1

## 2022-10-13 MED ORDER — POTASSIUM CHLORIDE CRYS ER 20 MEQ PO TBCR
40.0000 meq | EXTENDED_RELEASE_TABLET | ORAL | Status: AC
  Administered 2022-10-13 (×2): 40 meq via ORAL
  Filled 2022-10-13 (×2): qty 2

## 2022-10-13 MED ORDER — QUETIAPINE FUMARATE 25 MG PO TABS: 25.0000 mg | ORAL_TABLET | Freq: Every day | ORAL | Status: DC

## 2022-10-13 MED ORDER — BANATROL TF EN LIQD
60.0000 mL | Freq: Two times a day (BID) | ENTERAL | Status: DC
  Administered 2022-10-13 – 2022-10-18 (×10): 60 mL via ORAL
  Filled 2022-10-13 (×12): qty 60

## 2022-10-13 NOTE — Consult Note (Signed)
1730: Patient seen face-to-face.  Patient is alert and oriented x 4.  He reports that he knows his fear of eating, drinking and having diarrhea as part of his OCD.  We discussed the goal of feeling better and the steps he needs to take to get there.  Barriers of his poor nutrition may prolong his acceptance to psychiatry and getting ECT.    Dr. Toni Amend has increased Luvox to 100 mg, DC'd Seroquel and started olanzapine 5 mg.  Vanetta Mulders, PMHNP

## 2022-10-13 NOTE — Anesthesia Postprocedure Evaluation (Signed)
Anesthesia Post Note  Patient: Dillon [name]  Procedure(s) Performed: CYSTOSCOPY WITH RETROGRADE PYELOGRAM/URETERAL STENT PLACEMENT (Right)  Patient location during evaluation: PACU Anesthesia Type: General Level of consciousness: awake and alert Pain management: pain level controlled Vital Signs Assessment: post-procedure vital signs reviewed and stable Respiratory status: spontaneous breathing, nonlabored ventilation, respiratory function stable and patient connected to nasal cannula oxygen Cardiovascular status: blood pressure returned to baseline and stable Postop Assessment: no apparent nausea or vomiting Anesthetic complications: no   No notable events documented.   Last Vitals:  Vitals:   10/12/22 1922 10/13/22 0318  BP: (!) 142/94 (!) 146/78  Pulse: (!) 107 93  Resp: 16 16  Temp: 36.6 C 36.4 C  SpO2: 100% 97%    Last Pain:  Vitals:   10/13/22 0318  TempSrc: Oral  PainSc:                  [name]

## 2022-10-13 NOTE — Hospital Course (Addendum)
Dillon [name] is a 71 year old male with history of type 2 diabetes, depression, OCD, who presents to the hospital with worsening depression, poor appetite, weight loss.  He also had right flank pain.  He is seen by psychiatry, planned for ETC treatment when medically stable. 2 flank pain, CT scan was performed, showed obstructing right kidney staghorn calculus. Patient is seen by urology, ureteral stent was placed. Patient is also placed on antibiotics for possible pyelonephritis. 12/29.  Urine culture no growth.  Will continue 3 days of Keflex to complete 5 days of antibiotics.  Pending psych transfer, no bed available.

## 2022-10-13 NOTE — Progress Notes (Signed)
  Progress Note   Patient: Dillon [name] LZJ:[id number] DOB: 1951-07-15 DOA: 10/11/2022     1 DOS: the patient was seen and examined on 10/13/2022   Brief hospital course: Laval Cafaro is a 71 year old male with history of type 2 diabetes, depression, OCD, who presents to the hospital with worsening depression, poor appetite, weight loss.  He also had right flank pain.  He is seen by psychiatry, planned for ETC treatment when medically stable. 2 flank pain, CT scan was performed, showed obstructing right kidney staghorn calculus. Patient is seen by urology, ureteral stent was placed. Patient is also placed on antibiotics for possible pyelonephritis.  Assessment and Plan: * Acute pyelonephritis Possible sepsis Staghorn calculus with right hydronephrosis Patient is status post ureteral stent.  Condition has improved, initial urine culture negative.  Blood culture negative.  Will continue another day of Rocephin, planning to change to Keflex tomorrow at time of discharge time 5 days total.  Major depressive disorder, recurrent episode, severe (HCC) OCD Patient is accepted to go to psych unit for ECT.  Discussed with psychiatry, with no apparent today.  Likely transfer tomorrow.  Inadequate oral intake Ketonuria, related to fear of eating Continue to follow.   Hypokalemia Repleted today, recheck level tomorrow.  Diabetes mellitus, type II (HCC) Sliding scale insulin       Subjective:  Patient still has poor appetite, no flank pain anymore.  No dysuria or hematuria.  Physical Exam: Vitals:   10/12/22 1554 10/12/22 1922 10/13/22 0318 10/13/22 0759  BP: (!) 179/93 (!) 142/94 (!) 146/78 (!) 147/90  Pulse: 84 (!) 107 93 87  Resp: 18 16 16 16   Temp: 97.7 F (36.5 C) 97.8 F (36.6 C) 97.6 F (36.4 C) 97.7 F (36.5 C)  TempSrc: Oral Oral Oral Oral  SpO2: 98% 100% 97% 98%  Weight:      Height:       General exam: Appears calm and comfortable  Respiratory system: Clear  to auscultation. Respiratory effort normal. Cardiovascular system: S1 & S2 heard, RRR. No JVD, murmurs, rubs, gallops or clicks. No pedal edema. Gastrointestinal system: Abdomen is nondistended, soft and nontender. No organomegaly or masses felt. Normal bowel sounds heard. Central nervous system: Alert and oriented. No focal neurological deficits. Extremities: Symmetric 5 x 5 power. Skin: No rashes, lesions or ulcers Psychiatry: Depressed mood.   Data Reviewed:  CT scan results, lab results.  Family Communication: Daughter updated at bedside.  Disposition: Status is: Observation   Planned Discharge Destination:  Psychiatry unit.    Time spent: 35 minutes  Author: , MD 10/13/2022 1:49 PM  For on call review [email or web address].

## 2022-10-13 NOTE — Progress Notes (Signed)
Initial Nutrition Assessment  DOCUMENTATION CODES:   Severe malnutrition in context of acute illness/injury  INTERVENTION:   Glucerna Shake po BID, each supplement provides 220 kcal and 10 grams of protein  Ensure Max protein supplement daily, each supplement provides 150kcal and 30g of protein.  MVI po daily   Liberalize diet   Banatrol po BID- Provides 45kcal, 5g soluble fiber and 2g protein per serving.  Recommend consideration of appetite stimulant   Pt at high refeed risk; recommend monitor potassium, magnesium and phosphorus labs daily until stable  NUTRITION DIAGNOSIS:   Severe Malnutrition related to acute illness as evidenced by percent weight loss, mild fat depletion, mild muscle depletion, moderate muscle depletion, energy intake < 75% for > 7 days.  GOAL:   Patient will meet greater than or equal to 90% of their needs  MONITOR:   PO intake, Supplement acceptance, Labs, Weight trends, Skin, I & O's  REASON FOR ASSESSMENT:   Consult Assessment of nutrition requirement/status  ASSESSMENT:   71 y/o male with h/o OCD, recurrent MDD s/p ECT 2018, anxiety, HLD, BPH and DM who is admitted with depression with planned ECT, sepsis and staghorn calculus with right hydronephrosis s/p ureteral stent 12/27.  Met with pt and pt's daughter in room today. History provided by both patient and daughter. Pt reports poor oral intake since Thanksgiving. Pt's daughter reports that pt has been eating some stuff but oral intake has been significantly decreased. Pt has been drinking Ensure/Glucerna at home (prefers chocolate). Pt relates his poor oral intake to a combination of depression, diarrhea secondary to metformin and his kidney stone. Pt reports poor oral intake with depression in the past that was improved after ECT treatments. Pt reports that he is fearful to eat anything for fear of having diarrhea. Pt has not been having diarrhea but daughter reports this is an irrational  fear that patient has been having since the metformin was discontinued. Pt reports that he also feels like someone is poking him in his side. Pt has no desire to eat. Per chart, pt is down 24lbs(11%) since his last documented weight in June; pt reports weight loss has been over the past 6 weeks. RD encouraged pt to try and drink 3 supplements per day. RD discussed appetite stimulant and the possibility of needing NGT and nutrition support with pt and daughter. RD will add additional supplements and MVI to help pt meet his estimated needs. RD will also liberalize pt's diet. Pt is at high refeed risk. Plan is for ECT treatment next week. Will re-evaluate pt after he has had a couple of treatments to see if NGT is needed. Appetite stimulant was discussed with MD and NP. Will add a natural fiber supplement to help minimize pt's fear of having diarrhea.   Medications reviewed and include: B12, insulin, vitamin D, ceftriaxone   Labs reviewed: K 3.1(L), P 2.5 wnl, Mg 1.9 wnl Cbgs- 159, 152 x 24 hrs  AIC 6.3(H)- 12/26  NUTRITION - FOCUSED PHYSICAL EXAM:  Flowsheet Row Most Recent Value  Orbital Region No depletion  Upper Arm Region Mild depletion  Thoracic and Lumbar Region No depletion  Buccal Region No depletion  Temple Region No depletion  Clavicle Bone Region Mild depletion  Clavicle and Acromion Bone Region Mild depletion  Scapular Bone Region No depletion  Dorsal Hand No depletion  Patellar Region Mild depletion  Anterior Thigh Region Mild depletion  Posterior Calf Region Moderate depletion  Edema (RD Assessment) None  Hair Reviewed  Eyes Reviewed  Mouth Reviewed  Skin Reviewed  Nails Reviewed   Diet Order:   Diet Order             Diet regular Room service appropriate? Yes; Fluid consistency: Thin  Diet effective now                  EDUCATION NEEDS:   Education needs have been addressed  Skin:  Skin Assessment: Reviewed RN Assessment (incision penis)  Last BM:   pta  Height:   Ht Readings from Last 1 Encounters:  10/12/22 _0  (1.753 m)    Weight:   Wt Readings from Last 1 Encounters:  10/12/22 85.4 kg    Ideal Body Weight:  72.7 kg  BMI:  Body mass index is 27.79 kg/m.  Estimated Nutritional Needs:   Kcal:  1900-2200kcal/day  Protein:  95-110g/day  Fluid:  1.8-2.1L/day  Koleen Distance MS, RD, LDN Please refer to [facility] for RD and/or RD on-call/weekend/after hours pager

## 2022-10-14 DIAGNOSIS — N10 Acute tubulo-interstitial nephritis: Secondary | ICD-10-CM | POA: Diagnosis not present

## 2022-10-14 DIAGNOSIS — F332 Major depressive disorder, recurrent severe without psychotic features: Secondary | ICD-10-CM | POA: Diagnosis not present

## 2022-10-14 DIAGNOSIS — N200 Calculus of kidney: Secondary | ICD-10-CM | POA: Diagnosis not present

## 2022-10-14 DIAGNOSIS — E43 Unspecified severe protein-calorie malnutrition: Secondary | ICD-10-CM | POA: Insufficient documentation

## 2022-10-14 DIAGNOSIS — R630 Anorexia: Secondary | ICD-10-CM | POA: Diagnosis not present

## 2022-10-14 LAB — GLUCOSE, CAPILLARY
Glucose-Capillary: 131 mg/dL — ABNORMAL HIGH (ref 70–99)
Glucose-Capillary: 153 mg/dL — ABNORMAL HIGH (ref 70–99)
Glucose-Capillary: 192 mg/dL — ABNORMAL HIGH (ref 70–99)
Glucose-Capillary: 104 mg/dL — ABNORMAL HIGH (ref 70–99)

## 2022-10-14 LAB — BASIC METABOLIC PANEL
Anion gap: 9 (ref 5–15)
BUN: 14 mg/dL (ref 8–23)
CO2: 24 mmol/L (ref 22–32)
Calcium: 9.7 mg/dL (ref 8.9–10.3)
Chloride: 110 mmol/L (ref 98–111)
Creatinine, Ser: 0.99 mg/dL (ref 0.61–1.24)
GFR, Estimated: >60 mL/min (ref >60)
Glucose, Bld: 141 mg/dL — ABNORMAL HIGH (ref 70–99)
Potassium: 3.4 mmol/L — ABNORMAL LOW (ref 3.5–5.1)
Sodium: 143 mmol/L (ref 135–145)

## 2022-10-14 MED ORDER — LOPERAMIDE HCL 2 MG PO CAPS: 2.0000 mg | ORAL_CAPSULE | ORAL | Status: DC | PRN

## 2022-10-14 MED ORDER — CEPHALEXIN 500 MG PO CAPS
500.0000 mg | ORAL_CAPSULE | Freq: Three times a day (TID) | ORAL | Status: AC
  Administered 2022-10-14 – 2022-10-17 (×9): 500 mg via ORAL
  Filled 2022-10-14 (×9): qty 1

## 2022-10-14 MED ORDER — ENSURE ENLIVE PO LIQD
237.0000 mL | Freq: Three times a day (TID) | ORAL | Status: DC
  Administered 2022-10-14 – 2022-10-18 (×9): 237 mL via ORAL

## 2022-10-14 MED ORDER — DRONABINOL 2.5 MG PO CAPS
2.5000 mg | ORAL_CAPSULE | Freq: Two times a day (BID) | ORAL | Status: DC
  Administered 2022-10-15 – 2022-10-18 (×8): 2.5 mg via ORAL
  Filled 2022-10-14 (×9): qty 1

## 2022-10-14 MED ORDER — POTASSIUM CHLORIDE CRYS ER 20 MEQ PO TBCR
40.0000 meq | EXTENDED_RELEASE_TABLET | ORAL | Status: AC
  Administered 2022-10-14 (×2): 40 meq via ORAL
  Filled 2022-10-14: qty 2

## 2022-10-14 NOTE — Progress Notes (Signed)
Client is on the wait list for gero psych.  No beds available today per Dr Toni Amend.  Nanine Means, PMHNP

## 2022-10-14 NOTE — Care Management Obs Status (Signed)
MEDICARE OBSERVATION STATUS NOTIFICATION   Patient Details  Name: Sam Overbeck MRN: [id number] Date of Birth: Dec 19, 1950   Medicare Observation Status Notification Given:  No (Patient has been in observation status for greater than 46 hours)    Chapman Fitch, RN 10/14/2022, 1:40 PM

## 2022-10-14 NOTE — Progress Notes (Addendum)
  Progress Note   Patient: Dillon [name] OYD:[id number] DOB: 1950/10/19 DOA: 10/11/2022     1 DOS: the patient was seen and examined on 10/14/2022   Brief hospital course: Burdett Pinzon is a 71 year old male with history of type 2 diabetes, depression, OCD, who presents to the hospital with worsening depression, poor appetite, weight loss.  He also had right flank pain.  He is seen by psychiatry, planned for ETC treatment when medically stable. 2 flank pain, CT scan was performed, showed obstructing right kidney staghorn calculus. Patient is seen by urology, ureteral stent was placed. Patient is also placed on antibiotics for possible pyelonephritis. 12/29.  Urine culture no growth.  Will continue 3 days of Keflex to complete 5 days of antibiotics.  Pending psych transfer, no bed available.  Assessment and Plan: Acute pyelonephritis Possible sepsis Staghorn calculus with right hydronephrosis Patient is status post ureteral stent.   Condition improved, urine culture negative.  Will complete 5 days antibiotics.    Major depressive disorder, recurrent episode, severe (HCC) OCD Patient is pending transfer to psychiatry for ECT treatment.  Discussed with psychiatry, still has not better today.   Inadequate oral intake Ketonuria, related to fear of eating Continue to follow.     Hypokalemia Potassium is 3.4 today, give additional 80 mEq oral potassium.   Diabetes mellitus, type II (HCC) Sliding scale insulin      Subjective:  Patient has no complaints today except for poor appetite.  Physical Exam: Vitals:   10/13/22 1554 10/13/22 2230 10/14/22 0555 10/14/22 0821  BP: (!) 146/80 135/83 126/79 128/83  Pulse: 88 87 84 83  Resp: 16 (!) 21 18 17   Temp: 97.6 F (36.4 C) (!) 97 F (36.1 C) (!) 97.5 F (36.4 C) (!) 97.4 F (36.3 C)  TempSrc: Oral Oral Oral Oral  SpO2: 98% 94% 98% 96%  Weight:      Height:       General exam: Appears calm and comfortable  Respiratory  system: Clear to auscultation. Respiratory effort normal. Cardiovascular system: S1 & S2 heard, RRR. No JVD, murmurs, rubs, gallops or clicks. No pedal edema. Gastrointestinal system: Abdomen is nondistended, soft and nontender. No organomegaly or masses felt. Normal bowel sounds heard. Central nervous system: Alert and oriented. No focal neurological deficits. Extremities: Symmetric 5 x 5 power. Skin: No rashes, lesions or ulcers Psychiatry: Judgement and insight appear normal. Mood & affect appropriate.   Data Reviewed:  Lab results reviewed.  Family Communication: daughter updated  Disposition: Status is: Observation   Planned Discharge Destination:  Psych    Time spent: 35 minutes  Author: , MD 10/14/2022 1:08 PM  For on call review [email or web address].

## 2022-10-14 NOTE — TOC Initial Note (Signed)
Transition of Care [facility]) - Initial/Assessment Note    Patient Details  Name: Dillon [name] MRN: [id number] Date of Birth: 1951/06/19  Transition of Care [facility]) CM/SW Contact:    Chapman Fitch, RN Phone Number: 10/14/2022, 1:39 PM  Clinical Narrative:                    Per MD pending transfer to psychiatry for ECT treatment      Patient Goals and CMS Choice            Expected Discharge Plan and Services                                              Prior Living Arrangements/Services                       Activities of Daily Living Home Assistive Devices/Equipment: None ADL Screening (condition at time of admission) Patient's cognitive ability adequate to safely complete daily activities?: Yes Is the patient deaf or have difficulty hearing?: No Does the patient have difficulty seeing, even when wearing glasses/contacts?: No Does the patient have difficulty concentrating, remembering, or making decisions?: No Patient able to express need for assistance with ADLs?: Yes Does the patient have difficulty dressing or bathing?: No Independently performs ADLs?: Yes (appropriate for developmental age) Does the patient have difficulty walking or climbing stairs?: No Weakness of Legs: None Weakness of Arms/Hands: None  Permission Sought/Granted                  Emotional Assessment              Admission diagnosis:  Staghorn calculus [N20.0] Hydronephrosis, right [N13.30] Acute cystitis with hematuria [N30.01] Acute pyelonephritis [N10] Severe episode of recurrent major depressive disorder, without psychotic features (HCC) [F33.2] Patient Active Problem List   Diagnosis Date Noted   Protein-calorie malnutrition, severe 10/14/2022   Thrombocytopenia (HCC) 10/13/2022   Hydronephrosis, right 10/12/2022   Anxiety state 10/11/2022   Acute pyelonephritis 10/11/2022   Inadequate oral intake 10/11/2022   Ketonuria 10/11/2022    Hydronephrosis of right kidney 10/11/2022   Hypernatremia 04/10/2022   Vitamin B12 deficiency 01/01/2022   Vitamin D deficiency 12/31/2021   Hyperlipidemia associated with type 2 diabetes mellitus (HCC) 10/31/2021   Diabetes mellitus, type II (HCC) 10/31/2021   Type 2 diabetes mellitus with hyperglycemia, without long-term current use of insulin (HCC) 09/28/2021   Obesity 04/22/2020   Preventative health care 10/22/2019   Major depressive disorder, recurrent episode, severe (HCC) 03/22/2017   Depression, major, single episode, complete remission (HCC) 12/15/2015   OCD (obsessive compulsive disorder) 12/15/2015   Hypokalemia 12/01/2015   Benign localized hyperplasia of prostate with urinary obstruction 11/24/2015   Staghorn calculus 10/23/2015   Anomalies of urachus, congenital 10/23/2015   PCP:  Marjie Skiff, NP Pharmacy:   [facility] - [place], [place] - [address] [place] [address] Phone: [phone] Fax: [phone]  [facility] - [place], [place] - [address] [place] [address] Phone: [phone] Fax: [phone]  [id number] [place], [place] - [address] AT [facility] [address] [place] [address] Phone: [phone] Fax: [phone]     Social Determinants of Health (  SDOH) Social History: SDOH Screenings   Food Insecurity: No Food Insecurity (10/12/2022)  Housing: Low Risk  (10/12/2022)  Transportation Needs: No Transportation Needs (10/12/2022)  Utilities: Not At Risk (10/12/2022)  Alcohol Screen: Low Risk  (02/10/2022)  Depression (PHQ2-9): High Risk (10/11/2022)  Financial Resource Strain: Low Risk  (02/10/2022)  Physical Activity: Inactive (02/10/2022)  Social Connections: Moderately Integrated (02/10/2022)  Stress: No Stress Concern Present (02/10/2022)  Tobacco Use: Low Risk  (10/13/2022)   SDOH Interventions:     Readmission Risk Interventions      No data to display

## 2022-10-15 DIAGNOSIS — R630 Anorexia: Secondary | ICD-10-CM | POA: Insufficient documentation

## 2022-10-15 DIAGNOSIS — N10 Acute tubulo-interstitial nephritis: Secondary | ICD-10-CM | POA: Diagnosis not present

## 2022-10-15 DIAGNOSIS — F332 Major depressive disorder, recurrent severe without psychotic features: Secondary | ICD-10-CM | POA: Diagnosis not present

## 2022-10-15 DIAGNOSIS — N200 Calculus of kidney: Secondary | ICD-10-CM | POA: Diagnosis not present

## 2022-10-15 LAB — RESP PANEL BY RT-PCR (RSV, FLU A&B, COVID)  RVPGX2
Influenza A by PCR: NEGATIVE
Influenza B by PCR: NEGATIVE
Resp Syncytial Virus by PCR: NEGATIVE
SARS Coronavirus 2 by RT PCR: NEGATIVE

## 2022-10-15 LAB — GLUCOSE, CAPILLARY
Glucose-Capillary: 128 mg/dL — ABNORMAL HIGH (ref 70–99)
Glucose-Capillary: 158 mg/dL — ABNORMAL HIGH (ref 70–99)
Glucose-Capillary: 136 mg/dL — ABNORMAL HIGH (ref 70–99)
Glucose-Capillary: 125 mg/dL — ABNORMAL HIGH (ref 70–99)

## 2022-10-15 MED ORDER — SENNOSIDES-DOCUSATE SODIUM 8.6-50 MG PO TABS
1.0000 | ORAL_TABLET | Freq: Two times a day (BID) | ORAL | Status: DC
  Administered 2022-10-15 – 2022-10-18 (×6): 1 via ORAL
  Filled 2022-10-15 (×7): qty 1

## 2022-10-15 NOTE — Progress Notes (Signed)
Mobility Specialist - Progress Note    10/15/22 1505  Mobility  Activity Ambulated independently in hallway;Stood at bedside  Level of Assistance Independent  Assistive Device Front wheel walker  Distance Ambulated (ft) 640 ft  Activity Response Tolerated well  Mobility Referral Yes  $Mobility charge 1 Mobility   Pt resting in recliner on RA upon entry. Pt STS and ambulates indep. Around NS in hallway for 4 laps. Pt returned to recliner and left with needs in reach. Son present in room.   Johnathan Hausen Mobility Specialist 10/15/22, 3:25 PM

## 2022-10-15 NOTE — Progress Notes (Signed)
No beds on the gero unit today, he continues to be on the wait list.  Nanine Means, PMHNP

## 2022-10-15 NOTE — Progress Notes (Signed)
  Progress Note   Patient: Dillon [name] [id number] DOB: Mar 15, 1951 DOA: 10/11/2022     1 DOS: the patient was seen and examined on 10/15/2022   Brief hospital course: Dillon [name] is a 71 year old male with history of type 2 diabetes, depression, OCD, who presents to the hospital with worsening depression, poor appetite, weight loss.  He also had right flank pain.  He is seen by psychiatry, planned for ETC treatment when medically stable. 2 flank pain, CT scan was performed, showed obstructing right kidney staghorn calculus. Patient is seen by urology, ureteral stent was placed. Patient is also placed on antibiotics for possible pyelonephritis. 12/29.  Urine culture no growth.  Will continue 3 days of Keflex to complete 5 days of antibiotics.  Pending psych transfer, no bed available.  Assessment and Plan: Acute pyelonephritis Possible sepsis Staghorn calculus with right hydronephrosis Patient is status post ureteral stent.   Condition improved, urine culture negative.  Changed to Keflex, will complete 5 days antibiotics.     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 Patient is awaiting for transfer to psych unit for ECT, still has a poor appetite, has not had a bowel movement since admission, but the patient is more worried about diarrhea than constipation.  I have added 1 tablet senna twice a day, also added Imodium as needed just in case he needs it. Also added Marinol and started Ensure.    Hypokalemia Recheck labs tomorrow.   Diabetes mellitus, type II (HCC) Sliding scale insulin      Subjective:  Patient was reported drinking Ensure, is still afraid of eating any solid food. No nausea vomiting abdominal pain.  No bowel movement since admission.  Physical Exam: Vitals:   10/14/22 1512 10/14/22 1950 10/15/22 0326 10/15/22 0805  BP: (!) 140/91 137/87 120/81 139/84  Pulse: 87 95 (!) 105 90  Resp: 18  20 20 18   Temp: 97.7 F (36.5 C) 98 F (36.7 C) 97.8 F (36.6 C) (!) 97.3 F (36.3 C)  TempSrc:  Oral Oral   SpO2: 98% 96% 94% 96%  Weight:      Height:       General exam: Appears calm and comfortable  Respiratory system: Clear to auscultation. Respiratory effort normal. Cardiovascular system: S1 & S2 heard, RRR. No JVD, murmurs, rubs, gallops or clicks. No pedal edema. Gastrointestinal system: Abdomen is nondistended, soft and nontender. No organomegaly or masses felt. Normal bowel sounds heard. Central nervous system: Alert and oriented. No focal neurological deficits. Extremities: Symmetric 5 x 5 power. Skin: No rashes, lesions or ulcers Psychiatry: Depressed mood.  Data Reviewed:  Lab results reviewed.  Family Communication:   Disposition: Status is: Observation   Planned Discharge Destination:  Psych transfer pending    Time spent: 35 minutes  Author: , MD 10/15/2022 10:33 AM  For on call review [email or web address].

## 2022-10-16 DIAGNOSIS — N10 Acute tubulo-interstitial nephritis: Secondary | ICD-10-CM | POA: Diagnosis not present

## 2022-10-16 DIAGNOSIS — N200 Calculus of kidney: Secondary | ICD-10-CM | POA: Diagnosis not present

## 2022-10-16 DIAGNOSIS — F332 Major depressive disorder, recurrent severe without psychotic features: Secondary | ICD-10-CM | POA: Diagnosis not present

## 2022-10-16 DIAGNOSIS — R630 Anorexia: Secondary | ICD-10-CM | POA: Diagnosis not present

## 2022-10-16 LAB — CULTURE, BLOOD (ROUTINE X 2): Culture: NO GROWTH

## 2022-10-16 LAB — GLUCOSE, CAPILLARY
Glucose-Capillary: 134 mg/dL — ABNORMAL HIGH (ref 70–99)
Glucose-Capillary: 185 mg/dL — ABNORMAL HIGH (ref 70–99)
Glucose-Capillary: 147 mg/dL — ABNORMAL HIGH (ref 70–99)
Glucose-Capillary: 127 mg/dL — ABNORMAL HIGH (ref 70–99)

## 2022-10-16 LAB — BASIC METABOLIC PANEL
Anion gap: 10 (ref 5–15)
BUN: 21 mg/dL (ref 8–23)
CO2: 27 mmol/L (ref 22–32)
Calcium: 10 mg/dL (ref 8.9–10.3)
Chloride: 107 mmol/L (ref 98–111)
Creatinine, Ser: 0.99 mg/dL (ref 0.61–1.24)
GFR, Estimated: >60 mL/min (ref >60)
Glucose, Bld: 129 mg/dL — ABNORMAL HIGH (ref 70–99)
Potassium: 3.5 mmol/L (ref 3.5–5.1)
Sodium: 144 mmol/L (ref 135–145)

## 2022-10-16 LAB — PHOSPHORUS: Phosphorus: 2.9 mg/dL (ref 2.5–4.6)

## 2022-10-16 LAB — MAGNESIUM: Magnesium: 2.3 mg/dL (ref 1.7–2.4)

## 2022-10-16 MED ORDER — POTASSIUM CHLORIDE CRYS ER 20 MEQ PO TBCR
40.0000 meq | EXTENDED_RELEASE_TABLET | Freq: Once | ORAL | Status: AC
  Administered 2022-10-16: 40 meq via ORAL
  Filled 2022-10-16: qty 2

## 2022-10-16 MED ORDER — LACTULOSE 10 GM/15ML PO SOLN
20.0000 g | Freq: Once | ORAL | Status: AC
  Administered 2022-10-16: 20 g via ORAL
  Filled 2022-10-16: qty 30

## 2022-10-16 NOTE — Progress Notes (Signed)
Mobility Specialist - Progress Note     10/16/22 1107  Mobility  Activity Ambulated with assistance in hallway;Stood at bedside  Level of Assistance Independent  Assistive Device Front wheel walker  Distance Ambulated (ft) 170 ft  Activity Response Tolerated well  Mobility Referral Yes  $Mobility charge 1 Mobility    [employer] Mobility Specialist 10/16/22, 11:11 AM

## 2022-10-16 NOTE — Progress Notes (Signed)
Client is ready to transfer to gero, no bed availability at this time.  Hopefully, this will happen tomorrow.  Nanine Means, PMHNP

## 2022-10-16 NOTE — Plan of Care (Signed)
  Problem: Education: Goal: Ability to describe self-care measures that may prevent or decrease complications (Diabetes Survival Skills Education) will improve Outcome: Progressing Goal: Individualized Educational Video(s) Outcome: Progressing   Problem: Coping: Goal: Ability to adjust to condition or change in health will improve Outcome: Progressing   Problem: Fluid Volume: Goal: Ability to maintain a balanced intake and output will improve Outcome: Progressing   Problem: Health Behavior/Discharge Planning: Goal: Ability to identify and utilize available resources and services will improve Outcome: Progressing Goal: Ability to manage health-related needs will improve Outcome: Progressing   Problem: Metabolic: Goal: Ability to maintain appropriate glucose levels will improve Outcome: Progressing   Problem: Nutritional: Goal: Maintenance of adequate nutrition will improve Outcome: Progressing Goal: Progress toward achieving an optimal weight will improve Outcome: Progressing   Problem: Skin Integrity: Goal: Risk for impaired skin integrity will decrease Outcome: Progressing   Problem: Tissue Perfusion: Goal: Adequacy of tissue perfusion will improve Outcome: Progressing   Problem: Clinical Measurements: Goal: Diagnostic test results will improve Outcome: Progressing Goal: Signs and symptoms of infection will decrease Outcome: Progressing   Problem: Respiratory: Goal: Ability to maintain adequate ventilation will improve Outcome: Progressing   Problem: Education: Goal: Knowledge of General Education information will improve Description: Including pain rating scale, medication(s)/side effects and non-pharmacologic comfort measures Outcome: Progressing

## 2022-10-16 NOTE — Progress Notes (Addendum)
  Progress Note   Patient: Dillon [name] [id number] DOB: 1951/06/05 DOA: 10/11/2022     1 DOS: the patient was seen and examined on 10/16/2022   Brief hospital course: Dillon [name] is a 71 year old male with history of type 2 diabetes, depression, OCD, who presents to the hospital with worsening depression, poor appetite, weight loss.  He also had right flank pain.  He is seen by psychiatry, planned for ETC treatment when medically stable. 2 flank pain, CT scan was performed, showed obstructing right kidney staghorn calculus. Patient is seen by urology, ureteral stent was placed. Patient is also placed on antibiotics for possible pyelonephritis. 12/29.  Urine culture no growth.  Will continue 3 days of Keflex to complete 5 days of antibiotics.  Pending psych transfer, no bed available.  Assessment and Plan: Acute pyelonephritis Possible sepsis ruled out Staghorn calculus with right hydronephrosis Patient is status post ureteral stent.   Condition improved, urine culture negative.  Changed to Keflex, will complete 5 days antibiotics. I reviewed patient chart again, patient did not meet sepsis criteria at time of admission.   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 Patient is awaiting for transfer to psych unit for ECT, still has a poor appetite, has not had a bowel movement since admission, but the patient is more worried about diarrhea than constipation.  I have added 1 tablet senna twice a day, also added Imodium as needed just in case he needs it. Also added Marinol and started Ensure.  Patient still has no bowel movement since admission, will give a dose of lactulose.   Hypokalemia K 3.5, will give a dose of 40 mEQ KCl orally.   Diabetes mellitus, type II (HCC) Sliding scale insulin        Subjective:  Patient still afraid of eating, no nausea vomiting abdominal pain.  Has not had a bowel movement since  admission.  Physical Exam: Vitals:   10/15/22 1516 10/15/22 1906 10/16/22 0428 10/16/22 0753  BP: 120/82 (!) 155/90 (!) 142/95 128/83  Pulse: 83 (!) 102 (!) 102 (!) 117  Resp: 17 17 17 17   Temp: 98.6 F (37 C) 97.8 F (36.6 C) 98.1 F (36.7 C) 98 F (36.7 C)  TempSrc:  Oral Oral   SpO2: 98% 99% 93% 96%  Weight:      Height:       General exam: Appears calm and comfortable  Respiratory system: Clear to auscultation. Respiratory effort normal. Cardiovascular system: S1 & S2 heard, RRR. No JVD, murmurs, rubs, gallops or clicks. No pedal edema. Gastrointestinal system: Abdomen is nondistended, soft and nontender. No organomegaly or masses felt. Normal bowel sounds heard. Central nervous system: Alert and oriented. No focal neurological deficits. Extremities: Symmetric 5 x 5 power. Skin: No rashes, lesions or ulcers Psychiatry: Depressed mood.  Data Reviewed:  Lab results reviewed  Family Communication: daughter updated  Disposition: Status is: Observation   Planned Discharge Destination:  Psych    Time spent: 35 minutes  Author: , MD 10/16/2022 11:27 AM  For on call review [email or web address].

## 2022-10-17 DIAGNOSIS — F332 Major depressive disorder, recurrent severe without psychotic features: Secondary | ICD-10-CM | POA: Diagnosis not present

## 2022-10-17 DIAGNOSIS — N10 Acute tubulo-interstitial nephritis: Secondary | ICD-10-CM | POA: Diagnosis not present

## 2022-10-17 DIAGNOSIS — N200 Calculus of kidney: Secondary | ICD-10-CM | POA: Diagnosis not present

## 2022-10-17 DIAGNOSIS — R630 Anorexia: Secondary | ICD-10-CM | POA: Diagnosis not present

## 2022-10-17 LAB — GLUCOSE, CAPILLARY
Glucose-Capillary: 151 mg/dL — ABNORMAL HIGH (ref 70–99)
Glucose-Capillary: 150 mg/dL — ABNORMAL HIGH (ref 70–99)
Glucose-Capillary: 133 mg/dL — ABNORMAL HIGH (ref 70–99)
Glucose-Capillary: 116 mg/dL — ABNORMAL HIGH (ref 70–99)

## 2022-10-17 LAB — CULTURE, BLOOD (ROUTINE X 2)
Culture: NO GROWTH
Special Requests: ADEQUATE

## 2022-10-17 NOTE — Progress Notes (Signed)
  Progress Note   Patient: Dillon [name] CNO:[id number] DOB: 20-Oct-1950 DOA: 10/11/2022     1 DOS: the patient was seen and examined on 10/17/2022   Brief hospital course: Dillon [name] is a 72 year old male with history of type 2 diabetes, depression, OCD, who presents to the hospital with worsening depression, poor appetite, weight loss.  He also had right flank pain.  He is seen by psychiatry, planned for ETC treatment when medically stable. 2 flank pain, CT scan was performed, showed obstructing right kidney staghorn calculus. Patient is seen by urology, ureteral stent was placed. Patient is also placed on antibiotics for possible pyelonephritis. 12/29.  Urine culture no growth.  Will continue 3 days of Keflex to complete 5 days of antibiotics.  Pending psych transfer, no bed available.  Assessment and Plan: Acute pyelonephritis Possible sepsis ruled out Staghorn calculus with right hydronephrosis Patient is status post ureteral stent.   Condition improved, urine culture negative.  Changed to Keflex, will complete 5 days antibiotics. I reviewed patient chart again, patient did not meet sepsis criteria at time of admission. Antibiotics completed.   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 Patient is awaiting for transfer to psych unit for ECT, still has a poor appetite, has not had a bowel movement since admission, but the patient is more worried about diarrhea than constipation.  I have added 1 tablet senna twice a day, also added Imodium as needed just in case he needs it. Also added Marinol and started Ensure.  Patient was able to drink Ensure, but still has limited solid food intake.  No bowel movement after lactulose yesterday, will give tapwater enema. Patient is seen by psychiatry again, will reevaluate tomorrow to decide if patient still needing ECT treatment.   Hypokalemia K 3.5, will give a dose of 40 mEQ KCl  orally.   Diabetes mellitus, type II ([place]) Sliding scale insulin      Subjective:  Still has a poor appetite, limited solid food intake.  Physical Exam: Vitals:   10/16/22 1608 10/16/22 1900 10/17/22 0300 10/17/22 0736  BP: 127/81 119/75 (!) 130/99 (!) 141/93  Pulse: 88 85 97 94  Resp: 18 17 17 16   Temp: 98 F (36.7 C) 97.7 F (36.5 C) 98.1 F (36.7 C) 97.6 F (36.4 C)  TempSrc:  Oral  Oral  SpO2: 96% 95% 96% 94%  Weight:      Height:       General exam: Appears calm and comfortable  Respiratory system: Clear to auscultation. Respiratory effort normal. Cardiovascular system: S1 & S2 heard, RRR. No JVD, murmurs, rubs, gallops or clicks. No pedal edema. Gastrointestinal system: Abdomen is nondistended, soft and nontender. No organomegaly or masses felt. Normal bowel sounds heard. Central nervous system: Alert and oriented x3. No focal neurological deficits. Extremities: Symmetric 5 x 5 power. Skin: No rashes, lesions or ulcers Psychiatry: Flat affect.  Data Reviewed:  There are no new results to review at this time.  Family Communication: None  Disposition: Status is: Observation   Planned Discharge Destination:  TBD    Time spent: 35 minutes  Author: Sharen Hones, MD 10/17/2022 1:04 PM  For on call review [email or web address]

## 2022-10-17 NOTE — Plan of Care (Signed)

## 2022-10-17 NOTE — Consult Note (Signed)
Psychiatry consult: Follow-up for this 72 year old man with a history of depression.  Patient had been waiting on the medical service for admission to the geriatric psychiatry unit.  Came by to see him today because there had been talk about ECT.  Patient tells me today that his mood is feeling "pretty good".  He denies suicidal ideation.  Blunted affect but no evidence of psychosis.  Patient might not need transfer to geriatric.  We have no beds today.  Also might not need ECT.  I will follow-up with him again tomorrow and we can make a decision at that point.

## 2022-10-17 NOTE — Care Management Obs Status (Signed)
[place] NOTIFICATION   Patient Details  Name: Dillon [name] MRN: [id number] Date of Birth: 03/18/1951   Medicare Observation Status Notification Given:  Yes    Candie Chroman, LCSW 10/17/2022, 2:33 PM

## 2022-10-17 NOTE — Care Management CC44 (Signed)
Condition Code 44 Documentation Completed  Patient Details  Name: Dillon [name] MRN: [id number] Date of Birth: June 06, 1951   Condition Code 44 given:  Yes Patient signature on Condition Code 44 notice:  Yes Documentation of 2 MD's agreement:  Yes Code 44 added to claim:  Yes    Candie Chroman, LCSW 10/17/2022, 2:33 PM

## 2022-10-18 ENCOUNTER — Other Ambulatory Visit: Payer: Self-pay | Admitting: Psychiatry

## 2022-10-18 DIAGNOSIS — N200 Calculus of kidney: Secondary | ICD-10-CM | POA: Diagnosis not present

## 2022-10-18 DIAGNOSIS — R630 Anorexia: Secondary | ICD-10-CM | POA: Diagnosis not present

## 2022-10-18 DIAGNOSIS — N10 Acute tubulo-interstitial nephritis: Secondary | ICD-10-CM | POA: Diagnosis not present

## 2022-10-18 DIAGNOSIS — F332 Major depressive disorder, recurrent severe without psychotic features: Secondary | ICD-10-CM | POA: Diagnosis not present

## 2022-10-18 LAB — BASIC METABOLIC PANEL
Anion gap: 11 (ref 5–15)
BUN: 21 mg/dL (ref 8–23)
CO2: 27 mmol/L (ref 22–32)
Calcium: 10.3 mg/dL (ref 8.9–10.3)
Chloride: 107 mmol/L (ref 98–111)
Creatinine, Ser: 1.09 mg/dL (ref 0.61–1.24)
GFR, Estimated: >60 mL/min (ref >60)
Glucose, Bld: 140 mg/dL — ABNORMAL HIGH (ref 70–99)
Potassium: 3.9 mmol/L (ref 3.5–5.1)
Sodium: 145 mmol/L (ref 135–145)

## 2022-10-18 LAB — PHOSPHORUS: Phosphorus: 3.1 mg/dL (ref 2.5–4.6)

## 2022-10-18 LAB — GLUCOSE, CAPILLARY
Glucose-Capillary: 153 mg/dL — ABNORMAL HIGH (ref 70–99)
Glucose-Capillary: 155 mg/dL — ABNORMAL HIGH (ref 70–99)
Glucose-Capillary: 127 mg/dL — ABNORMAL HIGH (ref 70–99)
Glucose-Capillary: 131 mg/dL — ABNORMAL HIGH (ref 70–99)

## 2022-10-18 LAB — MAGNESIUM: Magnesium: 2.5 mg/dL — ABNORMAL HIGH (ref 1.7–2.4)

## 2022-10-18 NOTE — Progress Notes (Addendum)
  Progress Note   Patient: Dillon [name] IDP:[id number] DOB: 07-26-1951 DOA: 10/11/2022     0 DOS: the patient was seen and examined on 10/18/2022   Brief hospital course: Dillon [name] is a 72 year old male with history of type 2 diabetes, depression, OCD, who presents to the hospital with worsening depression, poor appetite, weight loss.  He also had right flank pain.  He is seen by psychiatry, planned for ETC treatment when medically stable. 2 flank pain, CT scan was performed, showed obstructing right kidney staghorn calculus. Patient is seen by urology, ureteral stent was placed. Patient is also placed on antibiotics for possible pyelonephritis. 12/29.  Urine culture no growth.  Will continue 3 days of Keflex to complete 5 days of antibiotics.  Pending psych transfer, no bed available.  Assessment and Plan:  Acute pyelonephritis Possible sepsis ruled out Staghorn calculus with right hydronephrosis Patient is status post ureteral stent.   Condition improved, urine culture negative.  Changed to Keflex, will complete 5 days antibiotics. I reviewed patient chart again, patient did not meet sepsis criteria at time of admission. Antibiotics completed.   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 Patient is awaiting for transfer to psych unit for ECT, still has a poor appetite, has not had a bowel movement since admission, but the patient is more worried about diarrhea than constipation.  I have added 1 tablet senna twice a day, also added Imodium as needed just in case he needs it. Also added Marinol and started Ensure.  Patient was able to drink Ensure, but still has limited solid food intake.  Has not had a bowel movement for 1 to 2 weeks, he had a bowel movement after giving tapwater enema. Discussed with Dr. Weber Cooks yesterday, he is not sure that the patient can improve with ECT. Medically stable to transfer to psych  unit.   Hypokalemia Potassium has normalized.   Diabetes mellitus, type II ([place]) Sliding scale insulin     Subjective:  Patient has significant depression and anorexia. Bowel movement yesterday after tapwater enema.  Physical Exam: Vitals:   10/17/22 1529 10/17/22 1934 10/18/22 0313 10/18/22 0809  BP: (!) 150/88 (!) 144/92 (!) 127/92 117/85  Pulse: 83 90 87 88  Resp: 20 14 16 16   Temp: (!) 97.5 F (36.4 C) 97.8 F (36.6 C) 97.9 F (36.6 C) 97.9 F (36.6 C)  TempSrc: Oral Oral  Oral  SpO2: 97% 97% 94% 95%  Weight:      Height:       General exam: Appears calm and comfortable  Respiratory system: Clear to auscultation. Respiratory effort normal. Cardiovascular system: S1 & S2 heard, RRR. No JVD, murmurs, rubs, gallops or clicks. No pedal edema. Gastrointestinal system: Abdomen is nondistended, soft and nontender. No organomegaly or masses felt. Normal bowel sounds heard. Central nervous system: Alert and oriented. No focal neurological deficits. Extremities: Symmetric 5 x 5 power. Skin: No rashes, lesions or ulcers Psychiatry:Flat affect  Data Reviewed:  Lab results reviewed.  Family Communication: None  Disposition: Status is: Observation   Planned Discharge Destination:  TBD    Time spent: 35 minutes  Author: Sharen Hones, MD 10/18/2022 2:32 PM  For on call review [email or web address].

## 2022-10-18 NOTE — Consult Note (Signed)
Psychiatry consult for ECT: Patient seen for follow-up today.  72 year old man with a history of recurrent episodes of severe depression and also chronic obsessive compulsive disorder.  Patient has now been medically cleared but continues to be unable to eat.  Patient tells me he is eaten very little only a few sips of beverage and liquid supplement in the last few days.  He tells me the last full meal he can remember was just after Thanksgiving.  He says the reason for this is because when he tries to eat he gets to worrying.  I asked him if there was something particular he was worried about and he said it was diarrhea.  By contrast he is currently very constipated and has had limited or no bowel movement for many days here in the hospital and had to have an enema in the last day to have any results.  Patient denies feeling depressed or sad.  He denies having any suicidal thought or intent.  His affect is flat and somewhat lethargic.  He does not report any active hallucinations or psychotic symptoms.  He is currently on Luvox which had been previously effective for him for his OCD.  The patient in the past had been treated with electroconvulsive therapy on at least 2 prior occasions but on those occasions he was endorsing clear-cut major depressive symptoms.  This episode is different in that he is not specifically depressed or suicidal but is not eating.  Makes it less clear whether he will respond to ECT however given the previous response probably still worth a try.  Treatment in the past has been complicated by his social isolation because he can never come in for outpatient ECT.  I explained the situation to the patient and ask him if he would be willing to consent to ECT under the circumstances.  He told me that he would.  Under these circumstances I think it would be reasonable to transfer him to the geriatric unit just to see if ECT is going to get him out of this situation of not eating.  The option  of sending him home like this risks him seriously declining if he is not eating.  Case will be reviewed with geriatric unit physician.  Continue current medicines.  I will put him n.p.o. tonight and try to get him on the ECT schedule for tomorrow.

## 2022-10-19 ENCOUNTER — Ambulatory Visit: Payer: Medicare HMO

## 2022-10-19 ENCOUNTER — Inpatient Hospital Stay: Payer: Medicare HMO | Admitting: Certified Registered"

## 2022-10-19 ENCOUNTER — Other Ambulatory Visit: Payer: Self-pay

## 2022-10-19 ENCOUNTER — Inpatient Hospital Stay
Admission: AD | Admit: 2022-10-19 | Discharge: 2022-11-02 | DRG: 885 | Disposition: A | Discharge status: Home / Self Care | Payer: Medicare HMO | Source: Intra-hospital | Attending: Psychiatry | Admitting: Psychiatry

## 2022-10-19 ENCOUNTER — Encounter: Payer: Self-pay | Admitting: Psychiatry

## 2022-10-19 DIAGNOSIS — F5000 Anorexia nervosa, unspecified: Secondary | ICD-10-CM | POA: Diagnosis present

## 2022-10-19 DIAGNOSIS — Z1152 Encounter for screening for COVID-19: Secondary | ICD-10-CM | POA: Diagnosis not present

## 2022-10-19 DIAGNOSIS — E119 Type 2 diabetes mellitus without complications: Secondary | ICD-10-CM | POA: Diagnosis not present

## 2022-10-19 DIAGNOSIS — Z8661 Personal history of infections of the central nervous system: Secondary | ICD-10-CM

## 2022-10-19 DIAGNOSIS — R7303 Prediabetes: Secondary | ICD-10-CM | POA: Diagnosis present

## 2022-10-19 DIAGNOSIS — F419 Anxiety disorder, unspecified: Secondary | ICD-10-CM | POA: Diagnosis present

## 2022-10-19 DIAGNOSIS — E876 Hypokalemia: Secondary | ICD-10-CM | POA: Diagnosis not present

## 2022-10-19 DIAGNOSIS — F332 Major depressive disorder, recurrent severe without psychotic features: Secondary | ICD-10-CM | POA: Diagnosis not present

## 2022-10-19 DIAGNOSIS — R4182 Altered mental status, unspecified: Secondary | ICD-10-CM | POA: Diagnosis not present

## 2022-10-19 DIAGNOSIS — T7840XA Allergy, unspecified, initial encounter: Secondary | ICD-10-CM | POA: Diagnosis not present

## 2022-10-19 DIAGNOSIS — F418 Other specified anxiety disorders: Secondary | ICD-10-CM | POA: Diagnosis not present

## 2022-10-19 DIAGNOSIS — Z87442 Personal history of urinary calculi: Secondary | ICD-10-CM | POA: Diagnosis not present

## 2022-10-19 DIAGNOSIS — R630 Anorexia: Secondary | ICD-10-CM | POA: Diagnosis not present

## 2022-10-19 DIAGNOSIS — N10 Acute tubulo-interstitial nephritis: Secondary | ICD-10-CM | POA: Diagnosis not present

## 2022-10-19 DIAGNOSIS — D696 Thrombocytopenia, unspecified: Secondary | ICD-10-CM | POA: Diagnosis not present

## 2022-10-19 DIAGNOSIS — N200 Calculus of kidney: Secondary | ICD-10-CM | POA: Diagnosis not present

## 2022-10-19 DIAGNOSIS — N1330 Unspecified hydronephrosis: Secondary | ICD-10-CM | POA: Diagnosis not present

## 2022-10-19 DIAGNOSIS — Z634 Disappearance and death of family member: Secondary | ICD-10-CM | POA: Diagnosis not present

## 2022-10-19 DIAGNOSIS — E43 Unspecified severe protein-calorie malnutrition: Secondary | ICD-10-CM | POA: Diagnosis not present

## 2022-10-19 DIAGNOSIS — E1165 Type 2 diabetes mellitus with hyperglycemia: Secondary | ICD-10-CM | POA: Diagnosis not present

## 2022-10-19 LAB — GLUCOSE, CAPILLARY
Glucose-Capillary: 148 mg/dL — ABNORMAL HIGH (ref 70–99)
Glucose-Capillary: 172 mg/dL — ABNORMAL HIGH (ref 70–99)
Glucose-Capillary: 140 mg/dL — ABNORMAL HIGH (ref 70–99)

## 2022-10-19 LAB — BASIC METABOLIC PANEL
Anion gap: 13 (ref 5–15)
BUN: 23 mg/dL (ref 8–23)
CO2: 24 mmol/L (ref 22–32)
Calcium: 10.3 mg/dL (ref 8.9–10.3)
Chloride: 107 mmol/L (ref 98–111)
Creatinine, Ser: 1.06 mg/dL (ref 0.61–1.24)
GFR, Estimated: >60 mL/min (ref >60)
Glucose, Bld: 164 mg/dL — ABNORMAL HIGH (ref 70–99)
Potassium: 3.7 mmol/L (ref 3.5–5.1)
Sodium: 144 mmol/L (ref 135–145)

## 2022-10-19 LAB — MAGNESIUM: Magnesium: 2.6 mg/dL — ABNORMAL HIGH (ref 1.7–2.4)

## 2022-10-19 MED ORDER — OLANZAPINE 5 MG PO TABS
5.0000 mg | ORAL_TABLET | Freq: Every day | ORAL | Status: DC
  Administered 2022-10-19 – 2022-10-25 (×7): 5 mg via ORAL
  Filled 2022-10-19 (×7): qty 1

## 2022-10-19 MED ORDER — ROSUVASTATIN CALCIUM 5 MG PO TABS
10.0000 mg | ORAL_TABLET | Freq: Every day | ORAL | Status: DC
  Administered 2022-10-20 – 2022-11-02 (×14): 10 mg via ORAL
  Filled 2022-10-19 (×14): qty 2

## 2022-10-19 MED ORDER — PROPOFOL 10 MG/ML IV BOLUS
INTRAVENOUS | Status: DC | PRN
  Administered 2022-10-19: 40 mg via INTRAVENOUS

## 2022-10-19 MED ORDER — ENSURE ENLIVE PO LIQD
237.0000 mL | Freq: Three times a day (TID) | ORAL | Status: DC
  Administered 2022-10-20 – 2022-11-01 (×21): 237 mL via ORAL

## 2022-10-19 MED ORDER — ACETAMINOPHEN 325 MG PO TABS: 650.0000 mg | ORAL_TABLET | Freq: Four times a day (QID) | ORAL | Status: DC | PRN

## 2022-10-19 MED ORDER — MAGNESIUM HYDROXIDE 400 MG/5ML PO SUSP
30.0000 mL | Freq: Every day | ORAL | Status: DC | PRN
  Administered 2022-10-22: 30 mL via ORAL
  Filled 2022-10-19 (×2): qty 30

## 2022-10-19 MED ORDER — TAMSULOSIN HCL 0.4 MG PO CAPS
0.4000 mg | ORAL_CAPSULE | Freq: Every day | ORAL | Status: DC
  Administered 2022-10-20 – 2022-11-02 (×14): 0.4 mg via ORAL
  Filled 2022-10-19 (×13): qty 1

## 2022-10-19 MED ORDER — FLUVOXAMINE MALEATE 100 MG PO TABS: 100.0000 mg | ORAL_TABLET | Freq: Every day | ORAL | Status: DC

## 2022-10-19 MED ORDER — SUCCINYLCHOLINE CHLORIDE 200 MG/10ML IV SOSY
PREFILLED_SYRINGE | INTRAVENOUS | Status: DC | PRN
  Administered 2022-10-19: 80 mg via INTRAVENOUS

## 2022-10-19 MED ORDER — SENNOSIDES-DOCUSATE SODIUM 8.6-50 MG PO TABS
1.0000 | ORAL_TABLET | Freq: Two times a day (BID) | ORAL | Status: DC
  Administered 2022-10-19 – 2022-11-01 (×24): 1 via ORAL
  Filled 2022-10-19 (×24): qty 1

## 2022-10-19 MED ORDER — KETAMINE HCL 10 MG/ML IJ SOLN
INTRAMUSCULAR | Status: DC | PRN
  Administered 2022-10-19: 40 mg via INTRAVENOUS

## 2022-10-19 MED ORDER — ONDANSETRON HCL 4 MG/2ML IJ SOLN: 4.0000 mg | Freq: Once | INTRAMUSCULAR | Status: DC | PRN

## 2022-10-19 MED ORDER — KETAMINE HCL 50 MG/5ML IJ SOSY
PREFILLED_SYRINGE | INTRAMUSCULAR | Status: AC
  Filled 2022-10-19: qty 5

## 2022-10-19 MED ORDER — BANATROL TF EN LIQD
60.0000 mL | Freq: Two times a day (BID) | ENTERAL | Status: DC
  Administered 2022-10-19 – 2022-10-26 (×12): 60 mL via ORAL
  Filled 2022-10-19 (×16): qty 60

## 2022-10-19 MED ORDER — ENSURE MAX PROTEIN PO LIQD: 11.0000 [oz_av] | Freq: Every day | ORAL | Status: DC

## 2022-10-19 MED ORDER — ADULT MULTIVITAMIN W/MINERALS CH
1.0000 | ORAL_TABLET | Freq: Every day | ORAL | Status: DC
  Administered 2022-10-20 – 2022-11-02 (×14): 1 via ORAL
  Filled 2022-10-19 (×13): qty 1

## 2022-10-19 MED ORDER — SODIUM CHLORIDE 0.9 % IV SOLN: 500.0000 mL | Freq: Once | INTRAVENOUS | Status: AC

## 2022-10-19 MED ORDER — ALUM & MAG HYDROXIDE-SIMETH 200-200-20 MG/5ML PO SUSP: 30.0000 mL | ORAL | Status: DC | PRN

## 2022-10-19 MED ORDER — OXYBUTYNIN CHLORIDE 5 MG PO TABS: 5.0000 mg | ORAL_TABLET | Freq: Three times a day (TID) | ORAL | Status: DC | PRN

## 2022-10-19 MED ORDER — KETOROLAC TROMETHAMINE 30 MG/ML IJ SOLN
30.0000 mg | Freq: Once | INTRAMUSCULAR | Status: AC
  Filled 2022-10-19: qty 1

## 2022-10-19 MED ORDER — VITAMIN D (ERGOCALCIFEROL) 1.25 MG (50000 UNIT) PO CAPS
50000.0000 [IU] | ORAL_CAPSULE | ORAL | Status: DC
  Administered 2022-10-21 – 2022-10-28 (×2): 50000 [IU] via ORAL
  Filled 2022-10-19 (×2): qty 1

## 2022-10-19 MED ORDER — LABETALOL HCL 5 MG/ML IV SOLN
INTRAVENOUS | Status: DC | PRN
  Administered 2022-10-19: 10 mg via INTRAVENOUS

## 2022-10-19 MED ORDER — FLUVOXAMINE MALEATE 50 MG PO TABS
100.0000 mg | ORAL_TABLET | Freq: Every day | ORAL | Status: DC
  Administered 2022-10-19 – 2022-10-21 (×3): 100 mg via ORAL
  Filled 2022-10-19 (×3): qty 2

## 2022-10-19 MED ORDER — FENTANYL CITRATE PF 50 MCG/ML IJ SOSY: 25.0000 ug | PREFILLED_SYRINGE | INTRAMUSCULAR | Status: DC | PRN

## 2022-10-19 MED ORDER — TAMSULOSIN HCL 0.4 MG PO CAPS: 0.4000 mg | ORAL_CAPSULE | Freq: Every day | ORAL | Status: DC

## 2022-10-19 MED ORDER — VITAMIN B-12 1000 MCG PO TABS
1000.0000 ug | ORAL_TABLET | Freq: Every day | ORAL | Status: DC
  Administered 2022-10-20 – 2022-11-02 (×14): 1000 ug via ORAL
  Filled 2022-10-19 (×13): qty 1

## 2022-10-19 MED ORDER — INSULIN ASPART 100 UNIT/ML IJ SOLN
0.0000 [IU] | Freq: Three times a day (TID) | INTRAMUSCULAR | Status: DC
  Administered 2022-10-19 – 2022-10-20 (×4): 2 [IU] via SUBCUTANEOUS
  Administered 2022-10-21 (×2): 1 [IU] via SUBCUTANEOUS
  Administered 2022-10-21 – 2022-10-22 (×3): 2 [IU] via SUBCUTANEOUS
  Administered 2022-10-22 (×2): 1 [IU] via SUBCUTANEOUS
  Administered 2022-10-23: 2 [IU] via SUBCUTANEOUS
  Administered 2022-10-23: 1 [IU] via SUBCUTANEOUS
  Administered 2022-10-23 – 2022-10-24 (×4): 2 [IU] via SUBCUTANEOUS
  Administered 2022-10-24: 1 [IU] via SUBCUTANEOUS
  Administered 2022-10-25: 2 [IU] via SUBCUTANEOUS
  Administered 2022-10-25: 3 [IU] via SUBCUTANEOUS
  Administered 2022-10-25: 2 [IU] via SUBCUTANEOUS
  Administered 2022-10-25: 3 [IU] via SUBCUTANEOUS
  Administered 2022-10-26: 1 [IU] via SUBCUTANEOUS
  Administered 2022-10-26 – 2022-10-27 (×2): 3 [IU] via SUBCUTANEOUS
  Administered 2022-10-27: 2 [IU] via SUBCUTANEOUS
  Administered 2022-10-27: 3 [IU] via SUBCUTANEOUS
  Administered 2022-10-27 – 2022-10-28 (×3): 2 [IU] via SUBCUTANEOUS
  Administered 2022-10-28: 3 [IU] via SUBCUTANEOUS
  Administered 2022-10-28 – 2022-10-29 (×3): 2 [IU] via SUBCUTANEOUS
  Administered 2022-10-29: 1 [IU] via SUBCUTANEOUS
  Administered 2022-10-29 – 2022-10-30 (×4): 2 [IU] via SUBCUTANEOUS
  Administered 2022-10-30: 3 [IU] via SUBCUTANEOUS
  Administered 2022-10-31: 2 [IU] via SUBCUTANEOUS
  Administered 2022-10-31: 1 [IU] via SUBCUTANEOUS
  Administered 2022-10-31 – 2022-11-01 (×4): 2 [IU] via SUBCUTANEOUS
  Administered 2022-11-01 (×2): 1 [IU] via SUBCUTANEOUS
  Administered 2022-11-02 (×2): 2 [IU] via SUBCUTANEOUS
  Filled 2022-10-19 (×49): qty 1

## 2022-10-19 NOTE — Plan of Care (Signed)
  Problem: Education: Goal: Knowledge of General Education information will improve Description: Including pain rating scale, medication(s)/side effects and non-pharmacologic comfort measures Outcome: Not Progressing   Problem: Health Behavior/Discharge Planning: Goal: Ability to manage health-related needs will improve Outcome: Not Progressing   Problem: Clinical Measurements: Goal: Ability to maintain clinical measurements within normal limits will improve Outcome: Not Progressing Goal: Will remain free from infection Outcome: Not Progressing Goal: Diagnostic test results will improve Outcome: Not Progressing Goal: Respiratory complications will improve Outcome: Not Progressing Goal: Cardiovascular complication will be avoided Outcome: Not Progressing   Problem: Nutrition: Goal: Adequate nutrition will be maintained Outcome: Not Progressing   Problem: Activity: Goal: Risk for activity intolerance will decrease Outcome: Not Progressing   Problem: Coping: Goal: Level of anxiety will decrease Outcome: Not Progressing   Problem: Elimination: Goal: Will not experience complications related to bowel motility Outcome: Not Progressing Goal: Will not experience complications related to urinary retention Outcome: Not Progressing   Problem: Safety: Goal: Ability to disclose and discuss suicidal ideas will improve Outcome: Not Progressing Goal: Ability to identify and utilize support systems that promote safety will improve Outcome: Not Progressing   Problem: Role Relationship: Goal: Will demonstrate positive changes in social behaviors and relationships Outcome: Not Progressing   Problem: Health Behavior/Discharge Planning: Goal: Ability to make decisions will improve Outcome: Not Progressing Goal: Compliance with therapeutic regimen will improve Outcome: Not Progressing

## 2022-10-19 NOTE — Procedures (Signed)
ECT SERVICES Physician's Interval Evaluation & Treatment Note  Patient Identification: Dillon [name] MRN:  [id number] Date of Evaluation:  10/19/2022 TX #: 1  MADRS:   MMSE:   P.E. Findings:  Patient is rundown tired has lost weight but otherwise unremarkable exam  Psychiatric Interval Note:  Blunted dysphoric very anxious.  Subjective:  Patient is a 72 y.o. male seen for evaluation for Electroconvulsive Therapy. Feels nervous not able to eat  Treatment Summary:   []   Right Unilateral             [x]  Bilateral   % Energy : 1.0 ms 50%   Impedance: 1070 ohms  Seizure Energy Index: 11,607 V squared  Postictal Suppression Index: 81%  Seizure Concordance Index: 98%  Medications  Pre Shock: Propofol 40 mg ketamine 40 mg succinylcholine 80 mg labetalol 5 mg  Post Shock:    Seizure Duration: 47 seconds EMG 88 seconds EEG   Comments: Well-tolerated.  Add labetalol upfront.  Follow-up next treatment will be Monday.  Lungs:  [x]   Clear to auscultation               []  Other:   Heart:    [x]   Regular rhythm             []  irregular rhythm    [x]   Previous H&P reviewed, patient examined and there are NO CHANGES                 []   Previous H&P reviewed, patient examined and there are changes noted.   Alethia Berthold, MD 1/3/20243:24 PM

## 2022-10-19 NOTE — Group Note (Signed)
LCSW Group Therapy Note  Group Date: 10/19/2022 Start Time:  1:00 PM End Time:  2:00 PM   Type of Therapy and Topic:  Group Therapy - How To Cope with Nervousness about Discharge   Participation Level:  Did Not Attend   Description of Group This process group involved identification of patients' feelings about discharge. Some of them are scheduled to be discharged soon, while others are new admissions, but each of them was asked to share thoughts and feelings surrounding discharge from the hospital. One common theme was that they are excited at the prospect of going home, while another was that many of them are apprehensive about sharing why they were hospitalized. Patients were given the opportunity to discuss these feelings with their peers in preparation for discharge.  Therapeutic Goals  Patient will identify their overall feelings about pending discharge. Patient will think about how they might proactively address issues that they believe will once again arise once they get home (i.e. with parents). Patients will participate in discussion about having hope for change.   Summary of Patient Progress:   Patient is not yet admitted to the unit at time of discharge.    Therapeutic Modalities Cognitive Behavioral Therapy   Larose Kells 10/19/2022  1:58 PM

## 2022-10-19 NOTE — Anesthesia Preprocedure Evaluation (Signed)
Anesthesia Evaluation  Patient identified by MRN, date of birth, ID band Patient awake    Reviewed: Allergy & Precautions, NPO status , Patient's Chart, lab work & pertinent test results  Airway Mallampati: III  TM Distance: >3 FB     Dental  (+) Chipped   Pulmonary neg pulmonary ROS    + decreased breath sounds      Cardiovascular Exercise Tolerance: Good + angina   Rate:Tachycardia     Neuro/Psych   Anxiety Depression    negative neurological ROS     GI/Hepatic negative GI ROS, Neg liver ROS,,,  Endo/Other  diabetes    Renal/GU Renal disease  negative genitourinary   Musculoskeletal   Abdominal   Peds negative pediatric ROS (+)  Hematology   Anesthesia Other Findings   Reproductive/Obstetrics                              Anesthesia Physical Anesthesia Plan  ASA: 3  Anesthesia Plan: General   Post-op Pain Management:    Induction: Intravenous  PONV Risk Score and Plan:   Airway Management Planned: Natural Airway  Additional Equipment:   Intra-op Plan:   Post-operative Plan:   Informed Consent: I have reviewed the patients History and Physical, chart, labs and discussed the procedure including the risks, benefits and alternatives for the proposed anesthesia with the patient or authorized representative who has indicated his/her understanding and acceptance.       Plan Discussed with: CRNA and Surgeon  Anesthesia Plan Comments:         Anesthesia Quick Evaluation  

## 2022-10-19 NOTE — Plan of Care (Signed)

## 2022-10-19 NOTE — H&P (Signed)
Dillon [name] is an 72 y.o. male.   Chief Complaint: Patient has been unable to eat due to mood and anxiety symptoms.  Some depression HPI: History of recurrent severe depression and obsessive-compulsive disorder  Past Medical History:  Diagnosis Date   Kidney stone    Meningitis    spinal   OCD (obsessive compulsive disorder)    Shingles     Past Surgical History:  Procedure Laterality Date   CYSTOSCOPY W/ URETERAL STENT PLACEMENT Right 10/12/2022   Procedure: CYSTOSCOPY WITH RETROGRADE PYELOGRAM/URETERAL STENT PLACEMENT;  Surgeon: Abbie Sons, MD;  Location: [facility] ORS;  Service: Urology;  Laterality: Right;   KIDNEY STONE SURGERY      Family History  Problem Relation Age of Onset   Cancer Mother        bladder   Emphysema Father    Emphysema Sister    COPD Sister    Cancer Brother        prostate   Social History:  reports that he has never smoked. He has never used smokeless tobacco. He reports that he does not drink alcohol and does not use drugs.  Allergies:  Allergies  Allergen Reactions   Codeine Other (See Comments)    Unsure of reaction    Medications Prior to Admission  Medication Sig Dispense Refill   cyanocobalamin (VITAMIN B12) 1000 MCG tablet Take 1 tablet (1,000 mcg total) by mouth daily. 90 tablet 4   fluvoxaMINE (LUVOX) 100 MG tablet Take 1 tablet (100 mg total) by mouth at bedtime.     glucose blood (ACCU-CHEK GUIDE) test strip Use to check blood sugars 2-3 times daily with goals = <130 fasting in morning and <180 two hours after eating. Bring blood sugar log to appointments. 100 each 12   lisinopril (ZESTRIL) 5 MG tablet Take 1 tablet (5 mg total) by mouth daily. 90 tablet 4   OLANZapine (ZYPREXA) 5 MG tablet Take 5 mg by mouth daily.     oxybutynin (DITROPAN) 5 MG tablet Take 1 tablet (5 mg total) by mouth every 8 (eight) hours as needed for bladder spasms (frequency,urgency).     rosuvastatin (CRESTOR) 10 MG tablet Take 1 tablet (10 mg  total) by mouth daily. 90 tablet 4   sitaGLIPtin (JANUVIA) 100 MG tablet Take 1 tablet (100 mg total) by mouth daily. 30 tablet 3   [START ON 10/20/2022] tamsulosin (FLOMAX) 0.4 MG CAPS capsule Take 1 capsule (0.4 mg total) by mouth daily. 30 capsule    Vitamin D, Ergocalciferol, (DRISDOL) 1.25 MG (50000 UNIT) CAPS capsule Take 1 capsule (50,000 Units total) by mouth every 7 (seven) days. 12 capsule 0    Results for orders placed or performed during the hospital encounter of 10/11/22 (from the past 48 hour(s))  Glucose, capillary     Status: Abnormal   Collection Time: 10/17/22  4:19 PM  Result Value Ref Range   Glucose-Capillary 133 (H) 70 - 99 mg/dL    Comment: Glucose reference range applies only to samples taken after fasting for at least 8 hours.   Comment 1 Notify RN    Comment 2 Document in Chart   Glucose, capillary     Status: Abnormal   Collection Time: 10/17/22  9:34 PM  Result Value Ref Range   Glucose-Capillary 116 (H) 70 - 99 mg/dL    Comment: Glucose reference range applies only to samples taken after fasting for at least 8 hours.   Comment 1 Notify RN    Comment  2 Document in Chart   Basic metabolic panel     Status: Abnormal   Collection Time: 10/18/22  6:21 AM  Result Value Ref Range   Sodium 145 135 - 145 mmol/L   Potassium 3.9 3.5 - 5.1 mmol/L   Chloride 107 98 - 111 mmol/L   CO2 27 22 - 32 mmol/L   Glucose, Bld 140 (H) 70 - 99 mg/dL    Comment: Glucose reference range applies only to samples taken after fasting for at least 8 hours.   BUN 21 8 - 23 mg/dL   Creatinine, Ser 7.82 0.61 - 1.24 mg/dL   Calcium 42.3 8.9 - 53.6 mg/dL   GFR, Estimated >14 >43 mL/min    Comment: (NOTE) Calculated using the CKD-EPI Creatinine Equation (2021)    Anion gap 11 5 - 15    Comment: Performed at [facility], [address] [place], [place] [address]  Magnesium     Status: Abnormal   Collection Time: 10/18/22  6:21 AM  Result Value Ref Range   Magnesium 2.5 (H)  1.7 - 2.4 mg/dL    Comment: Performed at [facility], [address] [place], [place] [address]  Phosphorus     Status: None   Collection Time: 10/18/22  6:21 AM  Result Value Ref Range   Phosphorus 3.1 2.5 - 4.6 mg/dL    Comment: Performed at [facility], [address] [place], [place] [address]  Glucose, capillary     Status: Abnormal   Collection Time: 10/18/22  8:11 AM  Result Value Ref Range   Glucose-Capillary 153 (H) 70 - 99 mg/dL    Comment: Glucose reference range applies only to samples taken after fasting for at least 8 hours.  Glucose, capillary     Status: Abnormal   Collection Time: 10/18/22 11:48 AM  Result Value Ref Range   Glucose-Capillary 155 (H) 70 - 99 mg/dL    Comment: Glucose reference range applies only to samples taken after fasting for at least 8 hours.  Glucose, capillary     Status: Abnormal   Collection Time: 10/18/22  4:26 PM  Result Value Ref Range   Glucose-Capillary 127 (H) 70 - 99 mg/dL    Comment: Glucose reference range applies only to samples taken after fasting for at least 8 hours.  Glucose, capillary     Status: Abnormal   Collection Time: 10/18/22  8:27 PM  Result Value Ref Range   Glucose-Capillary 131 (H) 70 - 99 mg/dL    Comment: Glucose reference range applies only to samples taken after fasting for at least 8 hours.  Basic metabolic panel     Status: Abnormal   Collection Time: 10/19/22  5:17 AM  Result Value Ref Range   Sodium 144 135 - 145 mmol/L   Potassium 3.7 3.5 - 5.1 mmol/L   Chloride 107 98 - 111 mmol/L   CO2 24 22 - 32 mmol/L   Glucose, Bld 164 (H) 70 - 99 mg/dL    Comment: Glucose reference range applies only to samples taken after fasting for at least 8 hours.   BUN 23 8 - 23 mg/dL   Creatinine, Ser 2.67 0.61 - 1.24 mg/dL   Calcium 12.4 8.9 - 58.0 mg/dL   GFR, Estimated >99 >83 mL/min    Comment: (NOTE) Calculated using the CKD-EPI Creatinine Equation (2021)    Anion gap 13 5 - 15    Comment:  Performed at [facility], [address]  [place], [place] [address]  Magnesium     Status: Abnormal   Collection Time: 10/19/22  5:17 AM  Result Value Ref Range   Magnesium 2.6 (H) 1.7 - 2.4 mg/dL    Comment: Performed at [facility], [address] [place], [place] [address]  Glucose, capillary     Status: Abnormal   Collection Time: 10/19/22  7:31 AM  Result Value Ref Range   Glucose-Capillary 148 (H) 70 - 99 mg/dL    Comment: Glucose reference range applies only to samples taken after fasting for at least 8 hours.   Comment 1 Notify RN    Comment 2 Document in Chart    No results found.  Review of Systems  Constitutional: Negative.   HENT: Negative.    Eyes: Negative.   Respiratory: Negative.    Cardiovascular: Negative.   Gastrointestinal: Negative.   Musculoskeletal: Negative.   Skin: Negative.   Neurological: Negative.   Psychiatric/Behavioral:  The patient is nervous/anxious.     Blood pressure 116/65, pulse 82, temperature (!) 97.3 F (36.3 C), resp. rate 20, height 5\' 9"  (1.753 m), weight 85.5 kg, SpO2 97 %. Physical Exam Vitals reviewed.  Constitutional:      Appearance: He is well-developed.  HENT:     Head: Normocephalic and atraumatic.  Eyes:     Conjunctiva/sclera: Conjunctivae normal.     Pupils: Pupils are equal, round, and reactive to light.  Cardiovascular:     Heart sounds: Normal heart sounds.  Pulmonary:     Effort: Pulmonary effort is normal.  Abdominal:     Palpations: Abdomen is soft.  Musculoskeletal:        General: Normal range of motion.     Cervical back: Normal range of motion.  Skin:    General: Skin is warm and dry.  Neurological:     General: No focal deficit present.     Mental Status: He is alert.  Psychiatric:        Attention and Perception: He is inattentive.        Mood and Affect: Mood is anxious. Affect is blunt.        Speech: Speech is delayed.        Behavior: Behavior is slowed.         Thought Content: Thought content does not include homicidal or suicidal ideation.        Cognition and Memory: Cognition is impaired.      Assessment/Plan Because of previous response to ECT we have agreed to a trial of ECT which must be done in the hospital.  Start today continue regular index trial.  Alethia Berthold, MD 10/19/2022, 3:22 PM

## 2022-10-19 NOTE — Transfer of Care (Signed)
Immediate Anesthesia Transfer of Care Note  Patient: Dillon [name]  Procedure(s) Performed: ECT TX  Patient Location: PACU  Anesthesia Type:General  Level of Consciousness: awake  Airway & Oxygen Therapy: Patient Spontanous Breathing  Post-op Assessment: Report given to RN and Post -op Vital signs reviewed and stable  Post vital signs: Reviewed  Last Vitals:  Vitals Value Taken Time  BP    Temp    Pulse    Resp    SpO2      Last Pain:  Vitals:   10/19/22 1414  TempSrc:   PainSc: 0-No pain         Complications: No notable events documented.

## 2022-10-19 NOTE — Discharge Summary (Signed)
Dillon [name] OEU:[id number] DOB: Jun 07, 1951 DOA: 10/11/2022  PCP: Venita Lick, NP  Admit date: 10/11/2022 Discharge date: 10/19/2022  Time spent: 35 minutes  Recommendations for Outpatient Follow-up:  Urology f/u Dr. Bernardo Heater 2 weeks    Discharge Diagnoses:  Principal Problem:   Acute pyelonephritis Active Problems:   Staghorn calculus   Major depressive disorder, recurrent episode, severe ([place])   Inadequate oral intake   Hypokalemia   OCD (obsessive compulsive disorder)   Diabetes mellitus, type II ([place])   Anxiety state   Ketonuria   Hydronephrosis of right kidney   Hydronephrosis, right   Thrombocytopenia (HCC)   Protein-calorie malnutrition, severe   Anorexia   Discharge Condition: stable  Diet recommendation: heart healthy  Filed Weights   10/12/22 1027  Weight: 85.4 kg    History of present illness:  From admission h and p Dillon [name] is a 72 y.o. male with medical history significant for Diabetes, depression and OCD with prior psychiatric hospitalizations with ECT in the pastWho present to the ED at the recommendation of his PCP for evaluation of reported refusal to eat and drink for fear of developing diarrhea.  Patient has reduce his oral intake of food and water for the past several months losing over 20 pounds and has been very fearful of getting out of the house.  Has had the symptoms before and they responded to ECT.  Patient otherwise feels at his baseline but endorses mild right flank pain.  Has decreased appetite but denies nausea, vomiting, abdominal pain or diarrhea.  Denies fever or chills.   Hospital Course:  Patient presented with worsening symptoms of depression. He was evaluated by psychiatry and decision made to admit to geri-psych unit and start ECT. Patient also complained of right flank pain and CT showed right staghorn calculus with right hydronephrosis. Urology consulted and Dr. Bernardo Heater placed a right ureteral stent on 12/27.  Patient was treated for pyelonephritis with a course of cephalosporin though urine culture negative.  Procedures: See above   Consultations: Psychiatry, urology  Discharge Exam: Vitals:   10/19/22 0508 10/19/22 0729  BP: (!) 140/92 135/86  Pulse: 81 86  Resp: 18 20  Temp: 97.7 F (36.5 C) 97.7 F (36.5 C)  SpO2: 96% 96%    General: NAD Cardiovascular: RRR Respiratory: CTAB Abdomen: soft, non-tender  Discharge Instructions   Discharge Instructions     Diet - low sodium heart healthy   Complete by: As directed    Increase activity slowly   Complete by: As directed       Allergies as of 10/19/2022       Reactions   Codeine Other (See Comments)   Unsure of reaction        Medication List     TAKE these medications    Accu-Chek Guide test strip Generic drug: glucose blood Use to check blood sugars 2-3 times daily with goals = <130 fasting in morning and <180 two hours after eating. Bring blood sugar log to appointments.   cyanocobalamin 1000 MCG tablet Commonly known as: VITAMIN B12 Take 1 tablet (1,000 mcg total) by mouth daily.   fluvoxaMINE 100 MG tablet Commonly known as: LUVOX Take 1 tablet (100 mg total) by mouth at bedtime. What changed:  medication strength how much to take   lisinopril 5 MG tablet Commonly known as: ZESTRIL Take 1 tablet (5 mg total) by mouth daily.   OLANZapine 5 MG tablet Commonly known as: ZYPREXA Take 5 mg by mouth  daily.   oxybutynin 5 MG tablet Commonly known as: DITROPAN Take 1 tablet (5 mg total) by mouth every 8 (eight) hours as needed for bladder spasms (frequency,urgency).   rosuvastatin 10 MG tablet Commonly known as: Crestor Take 1 tablet (10 mg total) by mouth daily.   sitaGLIPtin 100 MG tablet Commonly known as: Januvia Take 1 tablet (100 mg total) by mouth daily.   tamsulosin 0.4 MG Caps capsule Commonly known as: FLOMAX Take 1 capsule (0.4 mg total) by mouth daily. Start taking on: October 20, 2022   Vitamin D (Ergocalciferol) 1.25 MG (50000 UNIT) Caps capsule Commonly known as: DRISDOL Take 1 capsule (50,000 Units total) by mouth every 7 (seven) days.       Allergies  Allergen Reactions   Codeine Other (See Comments)    Unsure of reaction      The results of significant diagnostics from this hospitalization (including imaging, microbiology, ancillary and laboratory) are listed below for reference.    Significant Diagnostic Studies: DG OR UROLOGY CYSTO IMAGE ([facility] ONLY)  Result Date: 10/12/2022 There is no interpretation for this exam.  This order is for images obtained during a surgical procedure.  Please See "Surgeries" Tab for more information regarding the procedure.   CT ABDOMEN PELVIS W CONTRAST  Result Date: 10/11/2022 CLINICAL DATA:  Abdominal pain, decreased appetite EXAM: CT ABDOMEN AND PELVIS WITH CONTRAST TECHNIQUE: Multidetector CT imaging of the abdomen and pelvis was performed using the standard protocol following bolus administration of intravenous contrast. RADIATION DOSE REDUCTION: This exam was performed according to the departmental dose-optimization program which includes automated exposure control, adjustment of the mA and/or kV according to patient size and/or use of iterative reconstruction technique. CONTRAST:  OMNIPAQUE IOHEXOL 300 MG/ML  SOLN COMPARISON:  01/05/2016 FINDINGS: Lower chest: No acute pleural or parenchymal lung disease. Hepatobiliary: Multiple calcified and noncalcified gallstones fill the gallbladder lumen. No gallbladder wall thickening or pericholecystic fluid. The liver is unremarkable. No biliary duct dilation or choledocholithiasis. Pancreas: Unremarkable. No pancreatic ductal dilatation or surrounding inflammatory changes. Spleen: Normal in size without focal abnormality. Adrenals/Urinary Tract: Obstructing staghorn calculus at the right UPJ, measuring up to 3.0 cm in size. There is severe right-sided hydronephrosis with  marked right renal cortical thinning suggesting longstanding obstruction. Fat stranding surround the proximal right ureter could reflect superimposed infection. Left kidney enhances normally. Benign 1.2 cm simple cyst posterior mid left kidney does not require follow-up. No left-sided obstruction. The adrenals are stable. The bladder is decompressed, with no gross abnormality. Stomach/Bowel: No bowel obstruction or ileus. Diffuse colonic diverticulosis without diverticulitis. Normal appendix right lower quadrant. No bowel wall thickening or inflammatory change. Vascular/Lymphatic: Aortic atherosclerosis. No enlarged abdominal or pelvic lymph nodes. Reproductive: Continued enlargement of the prostate. Other: No free fluid or free intraperitoneal gas. Fat containing left inguinal hernia. No bowel herniation. Musculoskeletal: No acute or destructive bony lesions. Reconstructed images demonstrate no additional findings. IMPRESSION: 1. Obstructing right renal staghorn calculus, measuring up to 3 cm in size. Severe right-sided hydronephrosis with associated right renal cortical thinning, suggesting longstanding obstruction. Fat stranding surrounding the right renal pelvis and proximal right ureter could reflect superimposed infection. Please correlate with urinalysis. 2. Diffuse colonic diverticulosis without diverticulitis. 3. Cholelithiasis without cholecystitis. 4. Stable enlarged prostate. 5.  Aortic Atherosclerosis (ICD10-I70.0). Electronically Signed   By: Sharlet Salina M.D.   On: 10/11/2022 18:55   CT HEAD WO CONTRAST ( )  Result Date: 10/11/2022 CLINICAL DATA:  Mental status change EXAM: CT HEAD WITHOUT  CONTRAST TECHNIQUE: Contiguous axial images were obtained from the base of the skull through the vertex without intravenous contrast. RADIATION DOSE REDUCTION: This exam was performed according to the departmental dose-optimization program which includes automated exposure control, adjustment of the mA  and/or kV according to patient size and/or use of iterative reconstruction technique. COMPARISON:  CT scan of the brain January 11, 2016 FINDINGS: Brain: No evidence of acute infarction, hemorrhage, hydrocephalus, extra-axial collection or mass lesion/mass effect. Mild white matter changes identified. Vascular: No hyperdense vessel or unexpected calcification. Skull: Normal. Negative for fracture or focal lesion. Sinuses/Orbits: Paranasal sinuses are normal. Opacification of inferior right mastoid air cells identified, similar since the January 11, 2016 comparison. Significant opacification of the left sided mastoid air cells, also a stable finding. No bony erosion. The middle ears are well aerated. Other: No other abnormalities. IMPRESSION: 1. No acute intracranial abnormalities. 2. Chronic white matter changes. 3. Chronic opacification of the bilateral mastoid air cells. Electronically Signed   By: Dorise Bullion III M.D.   On: 10/11/2022 16:23    Microbiology: Recent Results (from the past 240 hour(s))  Resp panel by RT-PCR (RSV, Flu A&B, Covid) Anterior Nasal Swab     Status: None   Collection Time: 10/11/22  4:48 PM   Specimen: Anterior Nasal Swab  Result Value Ref Range Status   SARS Coronavirus 2 by RT PCR NEGATIVE NEGATIVE Final    Comment: (NOTE) SARS-CoV-2 target nucleic acids are NOT DETECTED.  The SARS-CoV-2 RNA is generally detectable in upper respiratory specimens during the acute phase of infection. The lowest concentration of SARS-CoV-2 viral copies this assay can detect is 138 copies/mL. A negative result does not preclude SARS-Cov-2 infection and should not be used as the sole basis for treatment or other patient management decisions. A negative result may occur with  improper specimen collection/handling, submission of specimen other than nasopharyngeal swab, presence of viral mutation(s) within the areas targeted by this assay, and inadequate number of viral copies(<138  copies/mL). A negative result must be combined with clinical observations, patient history, and epidemiological information. The expected result is Negative.  Fact Sheet for Patients:  EntrepreneurPulse.com.au  Fact Sheet for Healthcare Providers:  IncredibleEmployment.be  This test is no t yet approved or cleared by the [place] FDA and  has been authorized for detection and/or diagnosis of SARS-CoV-2 by FDA under an Emergency Use Authorization (EUA). This EUA will remain  in effect (meaning this test can be used) for the duration of the COVID-19 declaration under Section 564(b)(1) of the Act, 21 U.S.C.section 360bbb-3(b)(1), unless the authorization is terminated  or revoked sooner.       Influenza A by PCR NEGATIVE NEGATIVE Final   Influenza B by PCR NEGATIVE NEGATIVE Final    Comment: (NOTE) The Xpert Xpress SARS-CoV-2/FLU/RSV plus assay is intended as an aid in the diagnosis of influenza from Nasopharyngeal swab specimens and should not be used as a sole basis for treatment. Nasal washings and aspirates are unacceptable for Xpert Xpress SARS-CoV-2/FLU/RSV testing.  Fact Sheet for Patients: EntrepreneurPulse.com.au  Fact Sheet for Healthcare Providers: IncredibleEmployment.be  This test is not yet approved or cleared by the [place] FDA and has been authorized for detection and/or diagnosis of SARS-CoV-2 by FDA under an Emergency Use Authorization (EUA). This EUA will remain in effect (meaning this test can be used) for the duration of the COVID-19 declaration under Section 564(b)(1) of the Act, 21 U.S.C. section 360bbb-3(b)(1), unless the authorization is terminated or revoked.  Resp Syncytial Virus by PCR NEGATIVE NEGATIVE Final    Comment: (NOTE) Fact Sheet for Patients: BloggerCourse.comhttps://www.fda.gov/media/152166/download  Fact Sheet for Healthcare  Providers: SeriousBroker.ithttps://www.fda.gov/media/152162/download  This test is not yet approved or cleared by the [place] FDA and has been authorized for detection and/or diagnosis of SARS-CoV-2 by FDA under an Emergency Use Authorization (EUA). This EUA will remain in effect (meaning this test can be used) for the duration of the COVID-19 declaration under Section 564(b)(1) of the Act, 21 U.S.C. section 360bbb-3(b)(1), unless the authorization is terminated or revoked.  Performed at [facility], [address] [place], [place] [address]   Urine Culture     Status: None   Collection Time: 10/11/22  6:30 PM   Specimen: Urine, Clean Catch  Result Value Ref Range Status   Specimen Description   Final    URINE, CLEAN CATCH Performed at [facility], [address] [place], [place] [address]    Special Requests   Final    NONE Performed at [facility], [address] [place], [place] [address]    Culture   Final    NO GROWTH Performed at [facility], [address] [place], [place] [address]    Report Status 10/13/2022 FINAL  Final  Culture, blood (Routine X 2) w Reflex to ID Panel     Status: None   Collection Time: 10/11/22 10:22 PM   Specimen: BLOOD  Result Value Ref Range Status   Specimen Description BLOOD BLOOD RIGHT ARM  Final   Special Requests   Final    BOTTLES DRAWN AEROBIC AND ANAEROBIC Blood Culture results may not be optimal due to an excessive volume of blood received in culture bottles   Culture   Final    NO GROWTH 5 DAYS Performed at [facility], [address] [place], [place] [address]    Report Status 10/16/2022 FINAL  Final  Urine Culture     Status: None   Collection Time: 10/12/22 12:13 PM   Specimen: PATH Cytology Urine  Result Value Ref Range Status   Specimen Description CYSTOSCOPY  Final   Special Requests RT RENAL PELVIS  Final   Culture   Final    NO GROWTH Performed at [facility], [address] [place], [place] [address]    Report Status 10/13/2022 FINAL  Final  Culture, blood (Routine X 2) w Reflex to ID Panel     Status: None   Collection Time: 10/12/22  4:45 PM   Specimen: BLOOD RIGHT ARM  Result Value Ref Range Status   Specimen Description BLOOD RIGHT ARM RAC  Final   Special Requests   Final    BOTTLES DRAWN AEROBIC AND ANAEROBIC Blood Culture adequate volume   Culture   Final    NO GROWTH 5 DAYS Performed at [facility], [address] [place], [place] [address]    Report Status 10/17/2022 FINAL  Final  Resp panel by RT-PCR (RSV, Flu A&B, Covid) Anterior Nasal Swab     Status: None   Collection Time: 10/15/22  8:43 AM   Specimen: Anterior Nasal Swab  Result Value Ref Range Status   SARS Coronavirus 2 by RT PCR NEGATIVE NEGATIVE Final    Comment: (NOTE) SARS-CoV-2 target nucleic acids are NOT DETECTED.  The SARS-CoV-2 RNA is generally detectable in upper respiratory specimens during the acute phase of infection. The lowest concentration of SARS-CoV-2 viral copies this assay can detect is 138  copies/mL. A negative result does not preclude SARS-Cov-2 infection and should not be used as the sole basis for treatment or other patient management decisions. A negative result may occur with  improper specimen collection/handling, submission of specimen other than nasopharyngeal swab, presence of viral mutation(s) within the areas targeted by this assay, and inadequate number of viral copies(<138 copies/mL). A negative result must be combined with clinical observations, patient history, and epidemiological information. The expected result is Negative.  Fact Sheet for Patients:  BloggerCourse.com  Fact Sheet for Healthcare Providers:  SeriousBroker.it  This test is no t yet approved or cleared by the [place] FDA and  has been authorized for detection and/or diagnosis of SARS-CoV-2  by FDA under an Emergency Use Authorization (EUA). This EUA will remain  in effect (meaning this test can be used) for the duration of the COVID-19 declaration under Section 564(b)(1) of the Act, 21 U.S.C.section 360bbb-3(b)(1), unless the authorization is terminated  or revoked sooner.       Influenza A by PCR NEGATIVE NEGATIVE Final   Influenza B by PCR NEGATIVE NEGATIVE Final    Comment: (NOTE) The Xpert Xpress SARS-CoV-2/FLU/RSV plus assay is intended as an aid in the diagnosis of influenza from Nasopharyngeal swab specimens and should not be used as a sole basis for treatment. Nasal washings and aspirates are unacceptable for Xpert Xpress SARS-CoV-2/FLU/RSV testing.  Fact Sheet for Patients: BloggerCourse.com  Fact Sheet for Healthcare Providers: SeriousBroker.it  This test is not yet approved or cleared by the [place] FDA and has been authorized for detection and/or diagnosis of SARS-CoV-2 by FDA under an Emergency Use Authorization (EUA). This EUA will remain in effect (meaning this test can be used) for the duration of the COVID-19 declaration under Section 564(b)(1) of the Act, 21 U.S.C. section 360bbb-3(b)(1), unless the authorization is terminated or revoked.     Resp Syncytial Virus by PCR NEGATIVE NEGATIVE Final    Comment: (NOTE) Fact Sheet for Patients: BloggerCourse.com  Fact Sheet for Healthcare Providers: SeriousBroker.it  This test is not yet approved or cleared by the [place] FDA and has been authorized for detection and/or diagnosis of SARS-CoV-2 by FDA under an Emergency Use Authorization (EUA). This EUA will remain in effect (meaning this test can be used) for the duration of the COVID-19 declaration under Section 564(b)(1) of the Act, 21 U.S.C. section 360bbb-3(b)(1), unless the authorization is terminated or revoked.  Performed at  [facility], [address] [place], [place] [address]      Labs: Basic Metabolic Panel: Recent Labs  Lab 10/12/22 1646 10/13/22 1191 10/14/22 0513 10/16/22 0447 10/18/22 0621 10/19/22 0517  NA 145 142 143 144 145 144  K 3.3* 3.1* 3.4* 3.5 3.9 3.7  CL 109 109 110 107 107 107  CO2 25 24 24 27 27 24   GLUCOSE 166* 158* 141* 129* 140* 164*  BUN 12 13 14 21 21 23   CREATININE 0.98 0.85 0.99 0.99 1.09 1.06  CALCIUM 9.9 9.9 9.7 10.0 10.3 10.3  MG 1.8 1.9  --  2.3 2.5* 2.6*  PHOS 2.7 2.5  --  2.9 3.1  --    Liver Function Tests: Recent Labs  Lab 10/12/22 1646 10/13/22 0614  AST 32 21  ALT 60* 46*  ALKPHOS 109 92  BILITOT 1.7* 1.4*  PROT 6.8 5.9*  ALBUMIN 4.4 3.7   No results for input(s): "LIPASE", "AMYLASE" in the last 168 hours. No results for input(s): "AMMONIA" in the last 168 hours. CBC:  Recent Labs  Lab 10/13/22 0614  WBC 9.9  NEUTROABS 6.8  HGB 15.5  HCT 43.7  MCV 88.6  PLT 137*   Cardiac Enzymes: No results for input(s): "CKTOTAL", "CKMB", "CKMBINDEX", "TROPONINI" in the last 168 hours. BNP: BNP (last 3 results) No results for input(s): "BNP" in the last 8760 hours.  ProBNP (last 3 results) No results for input(s): "PROBNP" in the last 8760 hours.  CBG: Recent Labs  Lab 10/18/22 0811 10/18/22 1148 10/18/22 1626 10/18/22 2027 10/19/22 0731  GLUCAP 153* 155* 127* 131* 148*       Signed:  Silvano Bilis MD.  [facility] 10/19/2022, 10:42 AM

## 2022-10-19 NOTE — Progress Notes (Signed)
Gave report to [facility] in [place]. They will accept him after his ECT is done. All questions answered.

## 2022-10-20 DIAGNOSIS — F332 Major depressive disorder, recurrent severe without psychotic features: Secondary | ICD-10-CM | POA: Diagnosis not present

## 2022-10-20 LAB — GLUCOSE, CAPILLARY
Glucose-Capillary: 166 mg/dL — ABNORMAL HIGH (ref 70–99)
Glucose-Capillary: 165 mg/dL — ABNORMAL HIGH (ref 70–99)
Glucose-Capillary: 185 mg/dL — ABNORMAL HIGH (ref 70–99)
Glucose-Capillary: 120 mg/dL — ABNORMAL HIGH (ref 70–99)

## 2022-10-20 NOTE — Group Note (Addendum)
LCSW Group Therapy Note   Group Date: 10/20/2022 Start Time: 1300 End Time: 1400   LCSW Group Therapy Note          Type of Therapy/Topic:  Group Therapy: Emotional Regulation        Participation Level:  Active       Description of Group:      Group activity was held to utilize coping skills to help deal with strong emotions. Positive recreational activity and mindfulness were practiced to assist with distraction/amelioration of negative emotional outbursts and overload.       Therapeutic Goals:   1. Patient will identify positive activities to help distract/ameliorate negative emotions.   2. Patient will demonstrate positive leisure activity/mindfulness practice.       Summary of Patient Progress:   Patient was present for the majority of the group. CSW facilitated recreational games and activities during group session. Patient was present for the entirety of the group session. Patient was an active listener and participated in activity.   Patient had sad affect and low mood.      Therapeutic Modalities:   Mindfulness   Positive leisure
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
## 2022-10-20 NOTE — [facility] Suicide Risk Assessment (Signed)
[facility] Admission Suicide Risk Assessment   Nursing information obtained from:  Patient Demographic factors:  Male, Age 72 or older, Caucasian, Living alone Current Mental Status:  NA Loss Factors:  NA Historical Factors:  NA Risk Reduction Factors:  Positive social support  Total Time spent with patient: 1 hour Principal Problem: Severe recurrent major depression without psychotic features ([place]) Diagnosis:  Principal Problem:   Severe recurrent major depression without psychotic features ([place])  Subjective Data: Pt. To ED from [facility] for low appetite, anxiety, and depression. Pt. Lives by himself in home. Pt. States he is not eating because he has lack of appetite and is afraid of diarrhea. Pt. Denies SI/HI, or wanting to die.   Continued Clinical Symptoms:  Alcohol Use Disorder Identification Test Final Score (AUDIT): 0 The "Alcohol Use Disorders Identification Test", Guidelines for Use in Primary Care, Second Edition.  World Pharmacologist [facility]). Score between 0-7:  no or low risk or alcohol related problems. Score between 8-15:  moderate risk of alcohol related problems. Score between 16-19:  high risk of alcohol related problems. Score 20 or above:  warrants further diagnostic evaluation for alcohol dependence and treatment.   CLINICAL FACTORS:   Depression:   Anhedonia   Musculoskeletal: Strength & Muscle Tone: within normal limits Gait & Station: normal Patient leans: N/A  Psychiatric Specialty Exam:  Presentation  General Appearance:  Appropriate for Environment  Eye Contact: Good  Speech: Clear and Coherent  Speech Volume: Normal  Handedness: Right   Mood and Affect  Mood: Anxious; Depressed  Affect: Congruent   Thought Process  Thought Processes: Linear  Descriptions of Associations:Intact  Orientation:Full (Time, Place and Person)  Thought Content:Paranoid Ideation ("I will have diarrhea if  I eat or drink")  History of  Schizophrenia/Schizoaffective disorder:No  Duration of Psychotic Symptoms:No data recorded Hallucinations:No data recorded Ideas of Reference:Paranoia  Suicidal Thoughts:No data recorded Homicidal Thoughts:No data recorded  Sensorium  Memory: Immediate Good  Judgment: Poor  Insight: Fair   Community education officer  Concentration: Fair  Attention Span: Good  Recall: [employer] of Knowledge: Fair  Language: Fair   Psychomotor Activity  Psychomotor Activity:No data recorded  Assets  Assets: Desire for Improvement; Financial Resources/Insurance; Housing   Sleep  Sleep:No data recorded    Blood pressure 122/79, pulse 98, temperature (!) 97.5 F (36.4 C), temperature source Oral, resp. rate 18, height 5\' 9"  (1.753 m), weight 85.5 kg, SpO2 97 %. Body mass index is 27.84 kg/m.   COGNITIVE FEATURES THAT CONTRIBUTE TO RISK:  None    SUICIDE RISK:   Minimal: No identifiable suicidal ideation.  Patients presenting with no risk factors but with morbid ruminations; may be classified as minimal risk based on the severity of the depressive symptoms  PLAN OF CARE: See orders  I certify that inpatient services furnished can reasonably be expected to improve the patient's condition.   [place], DO 10/20/2022, 10:30 AM

## 2022-10-20 NOTE — Progress Notes (Signed)
Patient is A+O x4. He denies SI/HI/AVH. He denies pain. Fair appetite with encouragement. Medication compliant. He denies depression and anxiety but endorses slight sadness. Patient was visible in the milieu at times. He is able to make needs known and has no complaints. No distress noted.  Q15 minute unit checks in place.

## 2022-10-20 NOTE — H&P (Signed)
Psychiatric Admission Assessment Adult  Patient Identification: Dillon [name] MRN:  [id number] Date of Evaluation:  10/20/2022 Chief Complaint:  Severe recurrent major depression without psychotic features ([place]) [F33.2] Principal Diagnosis: Severe recurrent major depression without psychotic features ([place]) Diagnosis:  Principal Problem:   Severe recurrent major depression without psychotic features ([place])  History of Present Illness: Dillon [name] is a 72 year old white male who was voluntarily admitted to geriatric psychiatry for worsening depression.  He has not been eating.  States that he has been feeling down but denies any suicidal thoughts.  He is currently living alone.  His wife passed away 02-08-2015.  He received ECT from Dr. Weber Cooks back in February 07, 2017 and states that it helps.  Dr. Weber Cooks has restarted ECT yesterday.  He did not follow-up with any outpatient provider.Dr. Weber Cooks has increased Luvox to 100 mg, DC'd Seroquel and started olanzapine 5 mg.  He was recently started on metformin by his PCP for prediabetes.  He was found to have a kidney stone on admission and urology placed a stent and he was transferred to psychiatry.  He denies any auditory or visual hallucinations.  He is retired from the [employer].  He has 1 son and 1 daughter that lives close by and are supportive.  Associated Signs/Symptoms: Depression Symptoms:  depressed mood, anhedonia, decreased appetite, (Hypo) Manic Symptoms:   None Anxiety Symptoms:  Excessive Worry, Obsessive Compulsive Symptoms:   Checking,, Psychotic Symptoms:   None PTSD Symptoms: NA Total Time spent with patient: 1 hour  Past Psychiatric History: As above  Is the patient at risk to self? Yes.    Has the patient been a risk to self in the past 6 months? No.  Has the patient been a risk to self within the distant past? No.  Is the patient a risk to others? No.  Has the patient been a risk to others in the past 6 months? No.  Has the patient  been a risk to others within the distant past? No.   [place] Scale:  [facility] Admission (Current) from 10/19/2022 in [place] ED to Hosp-Admission (Discharged) from 10/11/2022 in [place] No Risk No Risk        Prior Inpatient Therapy: Yes.   If yes, describe as above Prior Outpatient Therapy: No. If yes, describe   Alcohol Screening: 1. How often do you have a drink containing alcohol?: Never 2. How many drinks containing alcohol do you have on a typical day when you are drinking?: 1 or 2 3. How often do you have six or more drinks on one occasion?: Never AUDIT-C Score: 0 4. How often during the last year have you found that you were not able to stop drinking once you had started?: Never 5. How often during the last year have you failed to do what was normally expected from you because of drinking?: Never 6. How often during the last year have you needed a first drink in the morning to get yourself going after a heavy drinking session?: Never 7. How often during the last year have you had a feeling of guilt of remorse after drinking?: Never 8. How often during the last year have you been unable to remember what happened the night before because you had been drinking?: Never 9. Have you or someone else been injured as a result of your drinking?: No 10. Has a relative or friend or a doctor or another Economist  been concerned about your drinking or suggested you cut down?: No Alcohol Use Disorder Identification Test Final Score (AUDIT): 0 Substance Abuse History in the last 12 months:  No. Consequences of Substance Abuse: NA Previous Psychotropic Medications: Yes  Psychological Evaluations: Yes  Past Medical History:  Past Medical History:  Diagnosis Date   Kidney stone    Meningitis    spinal   OCD (obsessive compulsive disorder)    Shingles     Past Surgical History:  Procedure  Laterality Date   CYSTOSCOPY W/ URETERAL STENT PLACEMENT Right 10/12/2022   Procedure: CYSTOSCOPY WITH RETROGRADE PYELOGRAM/URETERAL STENT PLACEMENT;  Surgeon: Abbie Sons, MD;  Location: [facility] ORS;  Service: Urology;  Laterality: Right;   KIDNEY STONE SURGERY     Family History:  Family History  Problem Relation Age of Onset   Cancer Mother        bladder   Emphysema Father    Emphysema Sister    COPD Sister    Cancer Brother        prostate   Family Psychiatric  History: Unremarkable Tobacco Screening:  Social History   Tobacco Use  Smoking Status Never  Smokeless Tobacco Never    BH Tobacco Counseling     Are you interested in Tobacco Cessation Medications?  No value filed. Counseled patient on smoking cessation:  No value filed. Reason Tobacco Screening Not Completed: No value filed.       Social History:  Social History   Substance and Sexual Activity  Alcohol Use No   Alcohol/week: 0.0 standard drinks of alcohol     Social History   Substance and Sexual Activity  Drug Use No    Additional Social History:                           Allergies:   Allergies  Allergen Reactions   Codeine Other (See Comments)    Unsure of reaction   Lab Results:  Results for orders placed or performed during the hospital encounter of 10/19/22 (from the past 48 hour(s))  Glucose, capillary     Status: Abnormal   Collection Time: 10/19/22  5:36 PM  Result Value Ref Range   Glucose-Capillary 172 (H) 70 - 99 mg/dL    Comment: Glucose reference range applies only to samples taken after fasting for at least 8 hours.  Glucose, capillary     Status: Abnormal   Collection Time: 10/19/22  9:14 PM  Result Value Ref Range   Glucose-Capillary 140 (H) 70 - 99 mg/dL    Comment: Glucose reference range applies only to samples taken after fasting for at least 8 hours.  Glucose, capillary     Status: Abnormal   Collection Time: 10/20/22  7:51 AM  Result Value Ref Range    Glucose-Capillary 166 (H) 70 - 99 mg/dL    Comment: Glucose reference range applies only to samples taken after fasting for at least 8 hours.    Blood Alcohol level:  Lab Results  Component Value Date   [facility] <5 01/04/2016   ETH <5 [id number]    Metabolic Disorder Labs:  Lab Results  Component Value Date   HGBA1C 6.3 (H) 10/11/2022   MPG 134 10/11/2022   MPG 91 03/27/2017   Lab Results  Component Value Date   PROLACTIN 29.7 (H) 01/05/2016   Lab Results  Component Value Date   CHOL 144 10/11/2022   TRIG 148 10/11/2022   HDL 44  10/11/2022   CHOLHDL 4.6 03/27/2017   VLDL 23 03/27/2017   LDLCALC 74 10/11/2022   LDLCALC 45 04/08/2022    Current Medications: Current Facility-Administered Medications  Medication Dose Route Frequency Provider Last Rate Last Admin   0.9 %  sodium chloride infusion  500 mL Intravenous Once Clapacs, Madie Reno, MD       acetaminophen (TYLENOL) tablet 650 mg  650 mg Oral Q6H PRN Clapacs, John T, MD       alum & mag hydroxide-simeth (MAALOX/MYLANTA) 200-200-20 MG/5ML suspension 30 mL  30 mL Oral Q4H PRN Clapacs, John T, MD       cyanocobalamin (VITAMIN B12) tablet 1,000 mcg  1,000 mcg Oral Daily Clapacs, John T, MD   1,000 mcg at 10/20/22 1478   feeding supplement (ENSURE ENLIVE / ENSURE PLUS) liquid 237 mL  237 mL Oral TID BM Clapacs, John T, MD   237 mL at 10/20/22 0926   fentaNYL (SUBLIMAZE) injection 25 mcg  25 mcg Intravenous Q5 min PRN [place] Service, Gijsbertus F, MD       fiber supplement (BANATROL TF) liquid 60 mL  60 mL Oral BID Clapacs, John T, MD   60 mL at 10/20/22 0926   fluvoxaMINE (LUVOX) tablet 100 mg  100 mg Oral QHS Clapacs, John T, MD   100 mg at 10/19/22 2125   insulin aspart (novoLOG) injection 0-9 Units  0-9 Units Subcutaneous TID AC & HS Clapacs, John T, MD   2 Units at 10/20/22 2956   ketorolac (TORADOL) 30 MG/ML injection 30 mg  30 mg Intravenous Once Clapacs, John T, MD       magnesium hydroxide (MILK OF MAGNESIA) suspension 30  mL  30 mL Oral Daily PRN Clapacs, Madie Reno, MD       multivitamin with minerals tablet 1 tablet  1 tablet Oral Daily Clapacs, Madie Reno, MD   1 tablet at 10/20/22 0924   OLANZapine (ZYPREXA) tablet 5 mg  5 mg Oral QHS Clapacs, John T, MD   5 mg at 10/19/22 2125   ondansetron (ZOFRAN) injection 4 mg  4 mg Intravenous Once PRN [place] Service, Jane Canary, MD       oxybutynin (DITROPAN) tablet 5 mg  5 mg Oral Q8H PRN Clapacs, John T, MD       protein supplement (ENSURE MAX) liquid  11 oz Oral Daily Clapacs, John T, MD       rosuvastatin (CRESTOR) tablet 10 mg  10 mg Oral Daily Clapacs, Madie Reno, MD   10 mg at 10/20/22 2130   senna-docusate (Senokot-S) tablet 1 tablet  1 tablet Oral BID Clapacs, Madie Reno, MD   1 tablet at 10/20/22 0949   tamsulosin (FLOMAX) capsule 0.4 mg  0.4 mg Oral Daily Clapacs, John T, MD   0.4 mg at 10/20/22 0925   [START ON 10/21/2022] Vitamin D (Ergocalciferol) (DRISDOL) 1.25 MG (50000 UNIT) capsule 50,000 Units  50,000 Units Oral Q7 days Clapacs, Madie Reno, MD       PTA Medications: Medications Prior to Admission  Medication Sig Dispense Refill Last Dose   cyanocobalamin (VITAMIN B12) 1000 MCG tablet Take 1 tablet (1,000 mcg total) by mouth daily. 90 tablet 4 10/18/2022   fluvoxaMINE (LUVOX) 100 MG tablet Take 1 tablet (100 mg total) by mouth at bedtime.   10/18/2022   glucose blood (ACCU-CHEK GUIDE) test strip Use to check blood sugars 2-3 times daily with goals = <130 fasting in morning and <180 two hours after eating. Bring  blood sugar log to appointments. 100 each 12 10/18/2022   lisinopril (ZESTRIL) 5 MG tablet Take 1 tablet (5 mg total) by mouth daily. 90 tablet 4 10/18/2022   OLANZapine (ZYPREXA) 5 MG tablet Take 5 mg by mouth daily.   10/18/2022   oxybutynin (DITROPAN) 5 MG tablet Take 1 tablet (5 mg total) by mouth every 8 (eight) hours as needed for bladder spasms (frequency,urgency).   10/18/2022   rosuvastatin (CRESTOR) 10 MG tablet Take 1 tablet (10 mg total) by mouth daily. 90 tablet 4  10/18/2022   sitaGLIPtin (JANUVIA) 100 MG tablet Take 1 tablet (100 mg total) by mouth daily. 30 tablet 3 10/18/2022   tamsulosin (FLOMAX) 0.4 MG CAPS capsule Take 1 capsule (0.4 mg total) by mouth daily. 30 capsule  10/18/2022   Vitamin D, Ergocalciferol, (DRISDOL) 1.25 MG (50000 UNIT) CAPS capsule Take 1 capsule (50,000 Units total) by mouth every 7 (seven) days. 12 capsule 0 10/18/2022    Musculoskeletal: Strength & Muscle Tone: within normal limits Gait & Station: normal Patient leans: N/A            Psychiatric Specialty Exam:  Presentation  General Appearance:  Appropriate for Environment  Eye Contact: Good  Speech: Clear and Coherent  Speech Volume: Normal  Handedness: Right   Mood and Affect  Mood: Anxious; Depressed  Affect: Congruent   Thought Process  Thought Processes: Linear  Duration of Psychotic Symptoms: 3 months Past Diagnosis of Schizophrenia or Psychoactive disorder: No  Descriptions of Associations:Intact  Orientation:Full (Time, Place and Person)  Thought Content:Paranoid Ideation ("I will have diarrhea if  I eat or drink")  Hallucinations:No data recorded Ideas of Reference:Paranoia  Suicidal Thoughts:No data recorded Homicidal Thoughts:No data recorded  Sensorium  Memory: Immediate Good  Judgment: Poor  Insight: Fair   Community education officer  Concentration: Fair  Attention Span: Good  Recall: [employer] of Knowledge: Fair  Language: Fair   Psychomotor Activity  Psychomotor Activity:No data recorded  Assets  Assets: Desire for Improvement; Financial Resources/Insurance; Housing   Sleep  Sleep:No data recorded   Physical Exam: Physical Exam Vitals and nursing note reviewed.  Constitutional:      Appearance: Normal appearance. He is normal weight.  HENT:     Head: Normocephalic and atraumatic.     Nose: Nose normal.     Mouth/Throat:     Pharynx: Oropharynx is clear.  Eyes:     Extraocular  Movements: Extraocular movements intact.     Pupils: Pupils are equal, round, and reactive to light.  Cardiovascular:     Rate and Rhythm: Normal rate and regular rhythm.     Pulses: Normal pulses.     Heart sounds: Normal heart sounds.  Pulmonary:     Effort: Pulmonary effort is normal.     Breath sounds: Normal breath sounds.  Abdominal:     General: Abdomen is flat. Bowel sounds are normal.     Palpations: Abdomen is soft.  Musculoskeletal:        General: Normal range of motion.     Cervical back: Normal range of motion and neck supple.  Skin:    General: Skin is warm and dry.  Neurological:     General: No focal deficit present.     Mental Status: He is alert and oriented to person, place, and time.  Psychiatric:        Attention and Perception: Attention and perception normal.        Mood and Affect: Mood is  anxious and depressed. Affect is flat.        Speech: Speech normal.        Behavior: Behavior normal. Behavior is cooperative.        Thought Content: Thought content normal.        Cognition and Memory: Cognition and memory normal.        Judgment: Judgment normal.    Review of Systems  Constitutional: Negative.   HENT: Negative.    Eyes: Negative.   Respiratory: Negative.    Cardiovascular: Negative.   Gastrointestinal: Negative.   Genitourinary: Negative.   Musculoskeletal: Negative.   Skin: Negative.   Neurological: Negative.   Endo/Heme/Allergies: Negative.   Psychiatric/Behavioral:  Positive for depression.    Blood pressure 122/79, pulse 98, temperature (!) 97.5 F (36.4 C), temperature source Oral, resp. rate 18, height 5\' 9"  (1.753 m), weight 85.5 kg, SpO2 97 %. Body mass index is 27.84 kg/m.  Treatment Plan Summary: Daily contact with patient to assess and evaluate symptoms and progress in treatment, Medication management, and Plan changes of already been made by Dr. Weber Cooks.  Continue current medications and ECT.  Observation Level/Precautions:   15 minute checks  Laboratory:  CBC Chemistry Profile  Psychotherapy:    Medications:    Consultations:    Discharge Concerns:    Estimated LOS:  Other:     Physician Treatment Plan for Primary Diagnosis: Severe recurrent major depression without psychotic features ([place]) Long Term Goal(s): Improvement in symptoms so as ready for discharge  Short Term Goals: Ability to identify changes in lifestyle to reduce recurrence of condition will improve, Ability to verbalize feelings will improve, Ability to disclose and discuss suicidal ideas, Ability to demonstrate self-control will improve, Ability to identify and develop effective coping behaviors will improve, Ability to maintain clinical measurements within normal limits will improve, Compliance with prescribed medications will improve, and Ability to identify triggers associated with substance abuse/mental health issues will improve  Physician Treatment Plan for Secondary Diagnosis: Principal Problem:   Severe recurrent major depression without psychotic features ([place])   I certify that inpatient services furnished can reasonably be expected to improve the patient's condition.    Parks Ranger, DO 1/4/202410:19 AM

## 2022-10-20 NOTE — [facility] Suicide Risk Assessment (Signed)
[place] INPATIENT:  Family/Significant Other Suicide Prevention Education  Suicide Prevention Education:  Education Completed; Wrigley Plasencia, daughter, [phone] has been identified by the patient as the family member/significant other with whom the patient will be residing, and identified as the person(s) who will aid the patient in the event of a mental health crisis (suicidal ideations/suicide attempt).  With written consent from the patient, the family member/significant other has been provided the following suicide prevention education, prior to the and/or following the discharge of the patient.  Daughter reports patient has hx of obsessive compulsions unrelated to eating. States recent anorexic symptoms have begun since being placed on metformin leading to loose stool. Patient has since been fearful of returning symptoms. All reports seem to be consistent with patient report. Daughter states that, at baseline, patient is very social and outgoing. Patient has a tendency to "attribute his improvement (in mental health) to [place] and stop taking his medications. It is unclear how long the patient has been medication non compliant.  . Daughter denies concerns of harm to self or others.   The suicide prevention education provided includes the following: Suicide risk factors Suicide prevention and interventions National Suicide Hotline telephone number [facility] assessment telephone number [facility] Emergency Assistance [place] and/or Residential Mobile Crisis Unit telephone number  Request made of family/significant other to: Remove weapons (e.g., guns, rifles, knives), all items previously/currently identified as safety concern.   Remove drugs/medications (over-the-counter, prescriptions, illicit drugs), all items previously/currently identified as a safety concern.  The family member/significant other verbalizes understanding of the suicide prevention education  information provided.  The family member/significant other agrees to remove the items of safety concern listed above.  Durenda Hurt 10/20/2022, 11:00 AM

## 2022-10-20 NOTE — Plan of Care (Signed)
  Problem: Nutrition: Goal: Adequate nutrition will be maintained Outcome: Progressing   Problem: Pain Managment: Goal: General experience of comfort will improve Outcome: Progressing   Problem: Safety: Goal: Ability to remain free from injury will improve Outcome: Progressing   Problem: Respiratory: Goal: Ability to maintain adequate ventilation will improve Outcome: Progressing

## 2022-10-20 NOTE — Plan of Care (Signed)
Pt visible in the milieu, calm, cooperative, sitting quietly. Pt reports decreased food intake due to lack of appetite and depression. Pt declined evening snack, pt provided with ice water. Pt denies SI, pt reports having ECT in the past which he stated helped, pt is hopeful that ECT will be effective. Pt reports poor sleep in the hospital, this writer educated pt regarding unit procedures/schedule, gave reassurance that we try to facilitate a peaceful environment that encourages rest. Pt shown call bell, instructed to use if needed. Pt medication compliant, 24 hour sleep total: 7.75 hours.  Problem: Safety: Goal: Ability to remain free from injury will improve Outcome: Progressing

## 2022-10-20 NOTE — Anesthesia Postprocedure Evaluation (Signed)
Anesthesia Post Note  Patient: Dillon [name]  Procedure(s) Performed: ECT TX  Patient location during evaluation: PACU Anesthesia Type: General Level of consciousness: awake Pain management: pain level controlled Vital Signs Assessment: post-procedure vital signs reviewed and stable Respiratory status: spontaneous breathing and nonlabored ventilation Cardiovascular status: stable Anesthetic complications: no  No notable events documented.   Last Vitals:  Vitals:   10/19/22 2026 10/20/22 0757  BP: 127/76 122/79  Pulse: 82 98  Resp:  18  Temp: (!) 36.4 C   SpO2: 99% 97%    Last Pain:  Vitals:   10/19/22 2100  TempSrc:   PainSc: 0-No pain                 VAN STAVEREN,Kacia Halley

## 2022-10-21 DIAGNOSIS — F332 Major depressive disorder, recurrent severe without psychotic features: Secondary | ICD-10-CM | POA: Diagnosis not present

## 2022-10-21 LAB — GLUCOSE, CAPILLARY
Glucose-Capillary: 166 mg/dL — ABNORMAL HIGH (ref 70–99)
Glucose-Capillary: 139 mg/dL — ABNORMAL HIGH (ref 70–99)
Glucose-Capillary: 150 mg/dL — ABNORMAL HIGH (ref 70–99)
Glucose-Capillary: 135 mg/dL — ABNORMAL HIGH (ref 70–99)

## 2022-10-21 NOTE — Progress Notes (Signed)
[facility] MD Progress Note  10/21/2022 9:51 AM Dillon [name]  MRN:  [id number] Subjective: Dillon [name] was seen on rounds.  He states that he slept pretty well last night.  He tells me that his appetite is picking up a little bit.  He is unsure of whether he is getting ECT today.  He is pleasant and cooperative.  He denies any side effects from his medications.  Apparently, back in 2018 he was discharged on Luvox and did well on it but never followed up and he has not taken it for years.  Dr. Toni Amend restarted at 100 mg/day.  Also he is on Zyprexa 5 at bedtime.  He denies any suicidal ideation.  Principal Problem: Severe recurrent major depression without psychotic features (HCC) Diagnosis: Principal Problem:   Severe recurrent major depression without psychotic features (HCC)  Total Time spent with patient: 15 minutes  Past Psychiatric History: ECT back in 2018.  He has been on Luvox in the past.  I believe he was supposed to follow-up at [facility] at that time.  Past Medical History:  Past Medical History:  Diagnosis Date   Kidney stone    Meningitis    spinal   OCD (obsessive compulsive disorder)    Shingles     Past Surgical History:  Procedure Laterality Date   CYSTOSCOPY W/ URETERAL STENT PLACEMENT Right 10/12/2022   Procedure: CYSTOSCOPY WITH RETROGRADE PYELOGRAM/URETERAL STENT PLACEMENT;  Surgeon: Riki Altes, MD;  Location: [facility] ORS;  Service: Urology;  Laterality: Right;   KIDNEY STONE SURGERY     Family History:  Family History  Problem Relation Age of Onset   Cancer Mother        bladder   Emphysema Father    Emphysema Sister    COPD Sister    Cancer Brother        prostate   Family Psychiatric  History: Unremarkable Social History:  Social History   Substance and Sexual Activity  Alcohol Use No   Alcohol/week: 0.0 standard drinks of alcohol     Social History   Substance and Sexual Activity  Drug Use No    Social History   Socioeconomic History   Marital  status: Widowed    Spouse name: Not on file   Number of children: Not on file   Years of education: Not on file   Highest education level: Not on file  Occupational History   Occupation: retired  Tobacco Use   Smoking status: Never   Smokeless tobacco: Never  Vaping Use   Vaping Use: Never used  Substance and Sexual Activity   Alcohol use: No    Alcohol/week: 0.0 standard drinks of alcohol   Drug use: No   Sexual activity: Not Currently  Other Topics Concern   Not on file  Social History Narrative   Not on file   Social Determinants of Health   Financial Resource Strain: Low Risk  (02/10/2022)   Overall Financial Resource Strain (CARDIA)    Difficulty of Paying Living Expenses: Not hard at all  Food Insecurity: No Food Insecurity (10/19/2022)   Hunger Vital Sign    Worried About Running Out of Food in the Last Year: Never true    Ran Out of Food in the Last Year: Never true  Transportation Needs: No Transportation Needs (10/19/2022)   PRAPARE - Administrator, Civil Service (Medical): No    Lack of Transportation (Non-Medical): No  Physical Activity: Inactive (02/10/2022)   Exercise Vital Sign  Days of Exercise per Week: 0 days    Minutes of Exercise per Session: 0 min  Stress: No Stress Concern Present (02/10/2022)   [place]    Feeling of Stress : Not at all  Social Connections: Moderately Integrated (02/10/2022)   Social Connection and Isolation Panel [NHANES]    Frequency of Communication with Friends and Family: Twice a week    Frequency of Social Gatherings with Friends and Family: Twice a week    Attends Religious Services: 1 to 4 times per year    Active Member of [employer] or Organizations: No    Attends Music therapist: More than 4 times per year    Marital Status: Widowed   Additional Social History:                         Sleep: Good  Appetite:   Fair  Current Medications: Current Facility-Administered Medications  Medication Dose Route Frequency Provider Last Rate Last Admin   0.9 %  sodium chloride infusion  500 mL Intravenous Once Clapacs, Madie Reno, MD       acetaminophen (TYLENOL) tablet 650 mg  650 mg Oral Q6H PRN Clapacs, Madie Reno, MD       alum & mag hydroxide-simeth (MAALOX/MYLANTA) 200-200-20 MG/5ML suspension 30 mL  30 mL Oral Q4H PRN Clapacs, John T, MD       cyanocobalamin (VITAMIN B12) tablet 1,000 mcg  1,000 mcg Oral Daily Clapacs, John T, MD   1,000 mcg at 10/21/22 0925   feeding supplement (ENSURE ENLIVE / ENSURE PLUS) liquid 237 mL  237 mL Oral TID BM Clapacs, John T, MD   237 mL at 10/21/22 0930   fentaNYL (SUBLIMAZE) injection 25 mcg  25 mcg Intravenous Q5 min PRN [place] Service, Gijsbertus F, MD       fiber supplement (BANATROL TF) liquid 60 mL  60 mL Oral BID Clapacs, John T, MD   60 mL at 10/20/22 2132   fluvoxaMINE (LUVOX) tablet 100 mg  100 mg Oral QHS Clapacs, John T, MD   100 mg at 10/20/22 2131   insulin aspart (novoLOG) injection 0-9 Units  0-9 Units Subcutaneous TID AC & HS Clapacs, John T, MD   2 Units at 10/21/22 5284   ketorolac (TORADOL) 30 MG/ML injection 30 mg  30 mg Intravenous Once Clapacs, John T, MD       magnesium hydroxide (MILK OF MAGNESIA) suspension 30 mL  30 mL Oral Daily PRN Clapacs, Madie Reno, MD       multivitamin with minerals tablet 1 tablet  1 tablet Oral Daily Clapacs, Madie Reno, MD   1 tablet at 10/21/22 0925   OLANZapine (ZYPREXA) tablet 5 mg  5 mg Oral QHS Clapacs, John T, MD   5 mg at 10/20/22 2132   ondansetron (ZOFRAN) injection 4 mg  4 mg Intravenous Once PRN [place] Service, Jane Canary, MD       oxybutynin (DITROPAN) tablet 5 mg  5 mg Oral Q8H PRN Clapacs, John T, MD       rosuvastatin (CRESTOR) tablet 10 mg  10 mg Oral Daily Clapacs, Madie Reno, MD   10 mg at 10/21/22 1324   senna-docusate (Senokot-S) tablet 1 tablet  1 tablet Oral BID Clapacs, Madie Reno, MD   1 tablet at 10/21/22 0925    tamsulosin (FLOMAX) capsule 0.4 mg  0.4 mg Oral Daily Clapacs, Madie Reno, MD  0.4 mg at 10/21/22 7209   Vitamin D (Ergocalciferol) (DRISDOL) 1.25 MG (50000 UNIT) capsule 50,000 Units  50,000 Units Oral Q7 days Clapacs, Jackquline Denmark, MD   50,000 Units at 10/21/22 4709    Lab Results:  Results for orders placed or performed during the hospital encounter of 10/19/22 (from the past 48 hour(s))  Glucose, capillary     Status: Abnormal   Collection Time: 10/19/22  5:36 PM  Result Value Ref Range   Glucose-Capillary 172 (H) 70 - 99 mg/dL    Comment: Glucose reference range applies only to samples taken after fasting for at least 8 hours.  Glucose, capillary     Status: Abnormal   Collection Time: 10/19/22  9:14 PM  Result Value Ref Range   Glucose-Capillary 140 (H) 70 - 99 mg/dL    Comment: Glucose reference range applies only to samples taken after fasting for at least 8 hours.  Glucose, capillary     Status: Abnormal   Collection Time: 10/20/22  7:51 AM  Result Value Ref Range   Glucose-Capillary 166 (H) 70 - 99 mg/dL    Comment: Glucose reference range applies only to samples taken after fasting for at least 8 hours.  Glucose, capillary     Status: Abnormal   Collection Time: 10/20/22 11:44 AM  Result Value Ref Range   Glucose-Capillary 165 (H) 70 - 99 mg/dL    Comment: Glucose reference range applies only to samples taken after fasting for at least 8 hours.  Glucose, capillary     Status: Abnormal   Collection Time: 10/20/22  4:36 PM  Result Value Ref Range   Glucose-Capillary 185 (H) 70 - 99 mg/dL    Comment: Glucose reference range applies only to samples taken after fasting for at least 8 hours.  Glucose, capillary     Status: Abnormal   Collection Time: 10/20/22  9:24 PM  Result Value Ref Range   Glucose-Capillary 120 (H) 70 - 99 mg/dL    Comment: Glucose reference range applies only to samples taken after fasting for at least 8 hours.  Glucose, capillary     Status: Abnormal    Collection Time: 10/21/22  7:35 AM  Result Value Ref Range   Glucose-Capillary 166 (H) 70 - 99 mg/dL    Comment: Glucose reference range applies only to samples taken after fasting for at least 8 hours.    Blood Alcohol level:  Lab Results  Component Value Date   ETH <5 01/04/2016   ETH <5 12/15/2015    Metabolic Disorder Labs: Lab Results  Component Value Date   HGBA1C 6.3 (H) 10/11/2022   MPG 134 10/11/2022   MPG 91 03/27/2017   Lab Results  Component Value Date   PROLACTIN 29.7 (H) 01/05/2016   Lab Results  Component Value Date   CHOL 144 10/11/2022   TRIG 148 10/11/2022   HDL 44 10/11/2022   CHOLHDL 4.6 03/27/2017   VLDL 23 03/27/2017   LDLCALC 74 10/11/2022   LDLCALC 45 04/08/2022    Physical Findings: AIMS:  , ,  ,  ,    CIWA:    COWS:     Musculoskeletal: Strength & Muscle Tone: within normal limits Gait & Station: normal Patient leans: N/A  Psychiatric Specialty Exam:  Presentation  General Appearance:  Appropriate for Environment  Eye Contact: Good  Speech: Clear and Coherent  Speech Volume: Normal  Handedness: Right   Mood and Affect  Mood: Anxious; Depressed  Affect: Congruent   Thought  Process  Thought Processes: Linear  Descriptions of Associations:Intact  Orientation:Full (Time, Place and Person)  Thought Content:Paranoid Ideation ("I will have diarrhea if  I eat or drink")  History of Schizophrenia/Schizoaffective disorder:No  Duration of Psychotic Symptoms:No data recorded Hallucinations:No data recorded Ideas of Reference:Paranoia  Suicidal Thoughts:No data recorded Homicidal Thoughts:No data recorded  Sensorium  Memory: Immediate Good  Judgment: Poor  Insight: Fair   Community education officer  Concentration: Fair  Attention Span: Good  Recall: [employer] of Knowledge: Fair  Language: Fair   Psychomotor Activity  Psychomotor Activity:No data recorded  Assets  Assets: Desire for  Improvement; Financial Resources/Insurance; Housing   Sleep  Sleep:No data recorded   Physical Exam: Physical Exam Vitals and nursing note reviewed.  Constitutional:      Appearance: Normal appearance. He is normal weight.  Neurological:     General: No focal deficit present.     Mental Status: He is alert and oriented to person, place, and time.  Psychiatric:        Attention and Perception: Attention and perception normal.        Mood and Affect: Mood is depressed. Affect is flat.        Speech: Speech normal.        Behavior: Behavior normal. Behavior is cooperative.        Thought Content: Thought content normal.        Cognition and Memory: Cognition and memory normal.        Judgment: Judgment normal.    Review of Systems  Constitutional: Negative.   HENT: Negative.    Eyes: Negative.   Respiratory: Negative.    Cardiovascular: Negative.   Gastrointestinal: Negative.   Genitourinary: Negative.   Musculoskeletal: Negative.   Skin: Negative.   Neurological: Negative.   Endo/Heme/Allergies: Negative.   Psychiatric/Behavioral: Negative.     Blood pressure 112/83, pulse (!) 106, temperature 98.2 F (36.8 C), temperature source Oral, resp. rate 20, height 5\' 9"  (1.753 m), weight 85.5 kg, SpO2 99 %. Body mass index is 27.84 kg/m.   Treatment Plan Summary: Daily contact with patient to assess and evaluate symptoms and progress in treatment, Medication management, and Plan continue current medications.  Parks Ranger, DO 10/21/2022, 9:51 AM

## 2022-10-21 NOTE — BH IP Treatment Plan (Signed)
Interdisciplinary Treatment and Diagnostic Plan Update  10/21/2022 Time of Session: 0830 Dillon [name] MRN: [id number]  Principal Diagnosis: Severe recurrent major depression without psychotic features [facility])  Secondary Diagnoses: Principal Problem:   Severe recurrent major depression without psychotic features (HCC)   Current Medications:  Current Facility-Administered Medications  Medication Dose Route Frequency Provider Last Rate Last Admin   0.9 %  sodium chloride infusion  500 mL Intravenous Once Clapacs, Jackquline Denmark, MD       acetaminophen (TYLENOL) tablet 650 mg  650 mg Oral Q6H PRN Clapacs, Jackquline Denmark, MD       alum & mag hydroxide-simeth (MAALOX/MYLANTA) 200-200-20 MG/5ML suspension 30 mL  30 mL Oral Q4H PRN Clapacs, John T, MD       cyanocobalamin (VITAMIN B12) tablet 1,000 mcg  1,000 mcg Oral Daily Clapacs, John T, MD   1,000 mcg at 10/21/22 0925   feeding supplement (ENSURE ENLIVE / ENSURE PLUS) liquid 237 mL  237 mL Oral TID BM Clapacs, John T, MD   237 mL at 10/21/22 0930   fentaNYL (SUBLIMAZE) injection 25 mcg  25 mcg Intravenous Q5 min PRN Darleene Cleaver, Gijsbertus F, MD       fiber supplement (BANATROL TF) liquid 60 mL  60 mL Oral BID Clapacs, John T, MD   60 mL at 10/20/22 2132   fluvoxaMINE (LUVOX) tablet 100 mg  100 mg Oral QHS Clapacs, John T, MD   100 mg at 10/20/22 2131   insulin aspart (novoLOG) injection 0-9 Units  0-9 Units Subcutaneous TID AC & HS Clapacs, John T, MD   2 Units at 10/21/22 2585   ketorolac (TORADOL) 30 MG/ML injection 30 mg  30 mg Intravenous Once Clapacs, John T, MD       magnesium hydroxide (MILK OF MAGNESIA) suspension 30 mL  30 mL Oral Daily PRN Clapacs, Jackquline Denmark, MD       multivitamin with minerals tablet 1 tablet  1 tablet Oral Daily Clapacs, Jackquline Denmark, MD   1 tablet at 10/21/22 0925   OLANZapine (ZYPREXA) tablet 5 mg  5 mg Oral QHS Clapacs, John T, MD   5 mg at 10/20/22 2132   ondansetron (ZOFRAN) injection 4 mg  4 mg Intravenous Once PRN Darleene Cleaver,  Gerrit Heck, MD       oxybutynin (DITROPAN) tablet 5 mg  5 mg Oral Q8H PRN Clapacs, John T, MD       rosuvastatin (CRESTOR) tablet 10 mg  10 mg Oral Daily Clapacs, Jackquline Denmark, MD   10 mg at 10/21/22 2778   senna-docusate (Senokot-S) tablet 1 tablet  1 tablet Oral BID Clapacs, Jackquline Denmark, MD   1 tablet at 10/21/22 0925   tamsulosin (FLOMAX) capsule 0.4 mg  0.4 mg Oral Daily Clapacs, John T, MD   0.4 mg at 10/21/22 2423   Vitamin D (Ergocalciferol) (DRISDOL) 1.25 MG (50000 UNIT) capsule 50,000 Units  50,000 Units Oral Q7 days Clapacs, Jackquline Denmark, MD   50,000 Units at 10/21/22 5361   PTA Medications: Medications Prior to Admission  Medication Sig Dispense Refill Last Dose   cyanocobalamin (VITAMIN B12) 1000 MCG tablet Take 1 tablet (1,000 mcg total) by mouth daily. 90 tablet 4 10/18/2022   fluvoxaMINE (LUVOX) 100 MG tablet Take 1 tablet (100 mg total) by mouth at bedtime.   10/18/2022   glucose blood (ACCU-CHEK GUIDE) test strip Use to check blood sugars 2-3 times daily with goals = <130 fasting in morning and <180 two hours after  eating. Bring blood sugar log to appointments. 100 each 12 10/18/2022   lisinopril (ZESTRIL) 5 MG tablet Take 1 tablet (5 mg total) by mouth daily. 90 tablet 4 10/18/2022   OLANZapine (ZYPREXA) 5 MG tablet Take 5 mg by mouth daily.   10/18/2022   oxybutynin (DITROPAN) 5 MG tablet Take 1 tablet (5 mg total) by mouth every 8 (eight) hours as needed for bladder spasms (frequency,urgency).   10/18/2022   rosuvastatin (CRESTOR) 10 MG tablet Take 1 tablet (10 mg total) by mouth daily. 90 tablet 4 10/18/2022   sitaGLIPtin (JANUVIA) 100 MG tablet Take 1 tablet (100 mg total) by mouth daily. 30 tablet 3 10/18/2022   tamsulosin (FLOMAX) 0.4 MG CAPS capsule Take 1 capsule (0.4 mg total) by mouth daily. 30 capsule  10/18/2022   Vitamin D, Ergocalciferol, (DRISDOL) 1.25 MG (50000 UNIT) CAPS capsule Take 1 capsule (50,000 Units total) by mouth every 7 (seven) days. 12 capsule 0 10/18/2022    Patient Stressors:     Patient Strengths:    Treatment Modalities: Medication Management, Group therapy, Case management,  1 to 1 session with clinician, Psychoeducation, Recreational therapy.   Physician Treatment Plan for Primary Diagnosis: Severe recurrent major depression without psychotic features ([place]) Long Term Goal(s): Improvement in symptoms so as ready for discharge   Short Term Goals: Ability to identify changes in lifestyle to reduce recurrence of condition will improve Ability to verbalize feelings will improve Ability to disclose and discuss suicidal ideas Ability to demonstrate self-control will improve Ability to identify and develop effective coping behaviors will improve Ability to maintain clinical measurements within normal limits will improve Compliance with prescribed medications will improve Ability to identify triggers associated with substance abuse/mental health issues will improve  Medication Management: Evaluate patient's response, side effects, and tolerance of medication regimen.  Therapeutic Interventions: 1 to 1 sessions, Unit Group sessions and Medication administration.  Evaluation of Outcomes: Progressing  Physician Treatment Plan for Secondary Diagnosis: Principal Problem:   Severe recurrent major depression without psychotic features ([place])  Long Term Goal(s): Improvement in symptoms so as ready for discharge   Short Term Goals: Ability to identify changes in lifestyle to reduce recurrence of condition will improve Ability to verbalize feelings will improve Ability to disclose and discuss suicidal ideas Ability to demonstrate self-control will improve Ability to identify and develop effective coping behaviors will improve Ability to maintain clinical measurements within normal limits will improve Compliance with prescribed medications will improve Ability to identify triggers associated with substance abuse/mental health issues will improve     Medication  Management: Evaluate patient's response, side effects, and tolerance of medication regimen.  Therapeutic Interventions: 1 to 1 sessions, Unit Group sessions and Medication administration.  Evaluation of Outcomes: Progressing   RN Treatment Plan for Primary Diagnosis: Severe recurrent major depression without psychotic features ([place]) Long Term Goal(s): Knowledge of disease and therapeutic regimen to maintain health will improve  Short Term Goals: Ability to remain free from injury will improve, Ability to verbalize frustration and anger appropriately will improve, Ability to demonstrate self-control, Ability to participate in decision making will improve, Ability to verbalize feelings will improve, Ability to disclose and discuss suicidal ideas, Ability to identify and develop effective coping behaviors will improve, and Compliance with prescribed medications will improve  Medication Management: RN will administer medications as ordered by provider, will assess and evaluate patient's response and provide education to patient for prescribed medication. RN will report any adverse and/or side effects to prescribing provider.  Therapeutic Interventions: 1 on 1 counseling sessions, Psychoeducation, Medication administration, Evaluate responses to treatment, Monitor vital signs and CBGs as ordered, Perform/monitor CIWA, COWS, AIMS and Fall Risk screenings as ordered, Perform wound care treatments as ordered.  Evaluation of Outcomes: Progressing   LCSW Treatment Plan for Primary Diagnosis: Severe recurrent major depression without psychotic features ([place]) Long Term Goal(s): Safe transition to appropriate next level of care at discharge, Engage patient in therapeutic group addressing interpersonal concerns.  Short Term Goals: Engage patient in aftercare planning with referrals and resources, Increase social support, Increase ability to appropriately verbalize feelings, Increase emotional regulation,  Facilitate acceptance of mental health diagnosis and concerns, Facilitate patient progression through stages of change regarding substance use diagnoses and concerns, Identify triggers associated with mental health/substance abuse issues, and Increase skills for wellness and recovery  Therapeutic Interventions: Assess for all discharge needs, 1 to 1 time with Social worker, Explore available resources and support systems, Assess for adequacy in community support network, Educate family and significant other(s) on suicide prevention, Complete Psychosocial Assessment, Interpersonal group therapy.  Evaluation of Outcomes: Progressing   Progress in Treatment: Attending groups: Yes. Participating in groups: Yes. Taking medication as prescribed: Yes. Toleration medication: Yes. Family/Significant other contact made: Yes, individual(s) contacted:  Clent Ridges, daughter Patient understands diagnosis: No. Discussing patient identified problems/goals with staff: Yes. Medical problems stabilized or resolved: Yes. Denies suicidal/homicidal ideation: Yes. Issues/concerns per patient self-inventory: Yes. Other: none  New problem(s) identified: No, Describe:  none  New Short Term/Long Term Goal(s): Patient to work towards medication management for mood stabilization; elimination of SI thoughts; development of comprehensive mental wellness plan.  Patient Goals:  Patient states their goal for treatment is to "get my eating up and get out."  Discharge Plan or Barriers: No psychosocial barriers identified at this time, patient to return to place of residence when appropriate for discharge.   Reason for Continuation of Hospitalization: Depression Medication stabilization  Estimated Length of Stay: 1-7 days   Last [facility] 2/9 Scores:    10/11/2022   12:16 PM 04/08/2022    1:58 PM 02/10/2022   11:21 AM  Depression screen PHQ 2/9  Decreased Interest 3 0 0  Down, Depressed, Hopeless 2 0 0  PHQ - 2 Score  5 0 0  Altered sleeping 2 2 0  Tired, decreased energy 2 2 0  Change in appetite 3 0 0  Feeling bad or failure about yourself  2 0 0  Trouble concentrating 1 0 0  Moving slowly or fidgety/restless 1 0 0  Suicidal thoughts 0 0 0  PHQ-9 Score 16 4 0  Difficult doing work/chores Very difficult Not difficult at all Not difficult at all    Scribe for Treatment Team: Durenda Hurt, Latanya Presser 10/21/2022 9:30 AM

## 2022-10-21 NOTE — [facility] Group Notes (Signed)
Patient came to group and played Bingo, from 1300 to 1330.

## 2022-10-21 NOTE — Plan of Care (Signed)
  Problem: Education: Goal: Knowledge of General Education information will improve Description: Including pain rating scale, medication(s)/side effects and non-pharmacologic comfort measures Outcome: Progressing   Problem: Health Behavior/Discharge Planning: Goal: Ability to manage health-related needs will improve Outcome: Progressing   Problem: Clinical Measurements: Goal: Ability to maintain clinical measurements within normal limits will improve Outcome: Progressing Goal: Will remain free from infection Outcome: Progressing Goal: Diagnostic test results will improve Outcome: Progressing Goal: Respiratory complications will improve Outcome: Progressing Goal: Cardiovascular complication will be avoided Outcome: Progressing   Problem: Activity: Goal: Risk for activity intolerance will decrease Outcome: Progressing   Problem: Nutrition: Goal: Adequate nutrition will be maintained Outcome: Progressing   Problem: Coping: Goal: Level of anxiety will decrease Outcome: Progressing   Problem: Elimination: Goal: Will not experience complications related to bowel motility Outcome: Progressing Goal: Will not experience complications related to urinary retention Outcome: Progressing   Problem: Pain Managment: Goal: General experience of comfort will improve Outcome: Progressing   Problem: Safety: Goal: Ability to remain free from injury will improve Outcome: Progressing   Problem: Skin Integrity: Goal: Risk for impaired skin integrity will decrease Outcome: Progressing   Problem: Fluid Volume: Goal: Hemodynamic stability will improve Outcome: Progressing   Problem: Clinical Measurements: Goal: Diagnostic test results will improve Outcome: Progressing Goal: Signs and symptoms of infection will decrease Outcome: Progressing   Problem: Health Behavior/Discharge Planning: Goal: Ability to make decisions will improve Outcome: Progressing Goal: Compliance with  therapeutic regimen will improve Outcome: Progressing   Problem: Role Relationship: Goal: Will demonstrate positive changes in social behaviors and relationships Outcome: Progressing   Problem: Safety: Goal: Ability to disclose and discuss suicidal ideas will improve Outcome: Progressing Goal: Ability to identify and utilize support systems that promote safety will improve Outcome: Progressing   Problem: Self-Concept: Goal: Will verbalize positive feelings about self Outcome: Progressing Goal: Level of anxiety will decrease Outcome: Progressing

## 2022-10-21 NOTE — Plan of Care (Signed)
Pt calm ,cooperative, declined snack. Reports slightly improved appetite in the day time. Medication compliant.  24 hour sleep 10.25  Problem: Education: Goal: Knowledge of General Education information will improve Description: Including pain rating scale, medication(s)/side effects and non-pharmacologic comfort measures Outcome: Progressing   Problem: Health Behavior/Discharge Planning: Goal: Ability to manage health-related needs will improve Outcome: Progressing   Problem: Clinical Measurements: Goal: Ability to maintain clinical measurements within normal limits will improve Outcome: Progressing Goal: Will remain free from infection Outcome: Progressing Goal: Diagnostic test results will improve Outcome: Progressing Goal: Respiratory complications will improve Outcome: Progressing Goal: Cardiovascular complication will be avoided Outcome: Progressing   Problem: Activity: Goal: Risk for activity intolerance will decrease Outcome: Progressing   Problem: Nutrition: Goal: Adequate nutrition will be maintained Outcome: Progressing   Problem: Coping: Goal: Level of anxiety will decrease Outcome: Progressing   Problem: Elimination: Goal: Will not experience complications related to bowel motility Outcome: Progressing Goal: Will not experience complications related to urinary retention Outcome: Progressing   Problem: Pain Managment: Goal: General experience of comfort will improve Outcome: Progressing   Problem: Safety: Goal: Ability to remain free from injury will improve Outcome: Progressing   Problem: Skin Integrity: Goal: Risk for impaired skin integrity will decrease Outcome: Progressing   Problem: Fluid Volume: Goal: Hemodynamic stability will improve Outcome: Progressing   Problem: Clinical Measurements: Goal: Diagnostic test results will improve Outcome: Progressing Goal: Signs and symptoms of infection will decrease Outcome: Progressing   Problem:  Respiratory: Goal: Ability to maintain adequate ventilation will improve Outcome: Progressing   Problem: Education: Goal: Utilization of techniques to improve thought processes will improve Outcome: Progressing Goal: Knowledge of the prescribed therapeutic regimen will improve Outcome: Progressing   Problem: Activity: Goal: Interest or engagement in leisure activities will improve Outcome: Progressing Goal: Imbalance in normal sleep/wake cycle will improve Outcome: Progressing   Problem: Coping: Goal: Coping ability will improve Outcome: Progressing Goal: Will verbalize feelings Outcome: Progressing   Problem: Health Behavior/Discharge Planning: Goal: Ability to make decisions will improve Outcome: Progressing Goal: Compliance with therapeutic regimen will improve Outcome: Progressing   Problem: Role Relationship: Goal: Will demonstrate positive changes in social behaviors and relationships Outcome: Progressing   Problem: Safety: Goal: Ability to disclose and discuss suicidal ideas will improve Outcome: Progressing Goal: Ability to identify and utilize support systems that promote safety will improve Outcome: Progressing   Problem: Self-Concept: Goal: Will verbalize positive feelings about self Outcome: Progressing Goal: Level of anxiety will decrease Outcome: Progressing

## 2022-10-21 NOTE — Progress Notes (Signed)
Patient is alert and oriented times 3. Mood and affect appropriate. Patient denies pain. He denies SI, HI, and AVH. Also denies feelings of anxiety and depression at this time. States he slept good last night. Morning meds given whole by mouth W/O difficulty. Ate breakfast in day room- appetite good. Patient remains on unit with Q15 minute checks in place.

## 2022-10-22 ENCOUNTER — Other Ambulatory Visit: Payer: Self-pay | Admitting: Psychiatry

## 2022-10-22 DIAGNOSIS — F332 Major depressive disorder, recurrent severe without psychotic features: Secondary | ICD-10-CM | POA: Diagnosis not present

## 2022-10-22 LAB — GLUCOSE, CAPILLARY
Glucose-Capillary: 150 mg/dL — ABNORMAL HIGH (ref 70–99)
Glucose-Capillary: 186 mg/dL — ABNORMAL HIGH (ref 70–99)
Glucose-Capillary: 147 mg/dL — ABNORMAL HIGH (ref 70–99)
Glucose-Capillary: 158 mg/dL — ABNORMAL HIGH (ref 70–99)

## 2022-10-22 MED ORDER — FLUVOXAMINE MALEATE 50 MG PO TABS
150.0000 mg | ORAL_TABLET | Freq: Every day | ORAL | Status: DC
  Administered 2022-10-22 – 2022-10-24 (×3): 150 mg via ORAL
  Filled 2022-10-22 (×3): qty 3

## 2022-10-22 NOTE — Patient Instructions (Incomplete)
Major Depressive Disorder, Adult Major depressive disorder (MDD) is a mental health condition. People with this disorder feel very sad, hopeless, and lose interest in things. Symptoms last most of the day, almost every day, for 2 weeks. MDD can affect: Relationships. Work and school. Things you usually like to do. What are the causes? The cause of MDD is not known. What increases the risk? Having family members with depression. Being male. Family problems. Alcohol or drug misuse. A lot of stress in your life, such as from: Living without basic needs such as food and housing. Being treated poorly because of race, sex, or religion (discrimination). Things that caused you pain as a child, especially if you lost a parent or were abused. Health and mental problems that you have had for a long time. What are the signs or symptoms? The main symptoms of this condition are: Being sad all the time. Being grouchy (irritable) all the time. Not enjoying the things you usually like. Sleeping too much or too little. Eating too much or too little. Feeling tired. Other symptoms include: Gaining or losing weight, without knowing why. Being restless and weak. Feeling hopeless, worthless, or guilty. Trouble thinking or making decisions. Thoughts of hurting yourself or others, or thoughts of ending your life. Spending a lot of time alone. Being unable to do daily tasks. If you have very bad MDD, you may: Believe things that are not true. Hear, see, taste, or feel things that are not there. Have mild depression that lasts for at least 2 years. Feel very sad and hopeless. Have trouble speaking or moving. Feel very sad during some seasons. How is this treated? Talk therapy. This teaches you about thoughts, feelings, and actions and how to change them. This can also help you to talk with others. This can be done with members of your family. Medicines. Lifestyle changes. You may need to: Limit  alcohol use. Stop using drugs, if you use them. Exercise. Get plenty of sleep. Eat healthy. Spend more time outdoors. Brain stimulation. This may be done when symptoms are very bad or have not gotten better. Follow these instructions at home: Alcohol use Do not drink alcohol if: Your health care provider tells you not to drink. You are pregnant, may be pregnant, or are planning to become pregnant. If you drink alcohol: Limit how much you use to: 0-1 drink a day for women. 0-2 drinks a day for men. Know how much alcohol is in your drink. In the [place] one drink equals one 12 oz bottle of beer (355 mL), one 5 oz glass of wine (148 mL), or one 1 oz glass of hard liquor (44 mL). Activity Exercise as told by your doctor. Spend time outdoors. Make time to do the things you enjoy. Find ways to deal with stress. Try to: Meditate. Do deep breathing. Spend time in nature. Keep a journal. Return to your normal activities when your doctor says that it is safe. General instructions  Take over-the-counter and prescription medicines only as told by your doctor. Talk to your doctor about: Alcohol use. It can affect medicines. Any drug use. Eat healthy foods. Get a lot of sleep. Think about joining a support group. Ask your doctor about that. Keep all follow-up visits. Your doctor will need to check on your mood, behavior, and medicines, and change your treatment as needed. Where to find more information: National Alliance on Mental Illness: nami.org National Institute of Mental Health: nimh.nih.gov American Psychiatric Association: psychiatry.org Contact a doctor   if: You feel worse. You get new symptoms. Get help right away if: You hurt yourself on purpose (self-harm). You have thoughts about hurting yourself or others. You see, hear, taste, smell, or feel things that are not there. Get help right away if you feel like you may hurt yourself or others, or have thoughts about taking  your own life. Go to your nearest emergency room or: Call 911. Call the National Suicide Prevention Lifeline at 1-800-273-8255 or 988. This is open 24 hours a day. Text the Crisis Text Line at 741741. This information is not intended to replace advice given to you by your health care provider. Make sure you discuss any questions you have with your health care provider. Document Revised: 02/08/2022 Document Reviewed: 02/08/2022 Elsevier Patient Education  2023 Elsevier Inc.  

## 2022-10-22 NOTE — [facility] Group Notes (Signed)
[facility] Group Notes:  (Nursing/MHT/Case Management/Adjunct)  Date:  10/22/2022  Time:  3:27 PM  Type of Therapy:   BINGO  Participation Level:  Did Not Attend  Participation Quality:    Affect:    Cognitive:    Insight:    Engagement in Group:    Modes of Intervention:    Summary of Progress/Problems: Patient was resting in bedroom and did not attend.  Ileene Musa 10/22/2022, 3:27 PM

## 2022-10-22 NOTE — Plan of Care (Signed)
  Problem: Nutrition: Goal: Adequate nutrition will be maintained Outcome: Progressing   Problem: Elimination: Goal: Will not experience complications related to urinary retention Outcome: Progressing   Problem: Elimination: Goal: Will not experience complications related to bowel motility Outcome: Not Progressing

## 2022-10-22 NOTE — Progress Notes (Signed)
Patient is alert and oriented times 4. Mood and affect appropriate. Patient rates pain as 0/10. He denies SI, HI, and AVH. Patient does endorse feelings of anxiety and depression at this time. Patient states he did not sleep well last night. Evening medicines administered whole by mouth without difficulty. Patient refused to eat snack or drink his Ensure. Patient remains on unit with Q15 minute checks in place.

## 2022-10-22 NOTE — Progress Notes (Signed)
Patient is A+O x4. He denies pain. Appetite fair. He denies SI/HI/AVH. He endorses sadness but agrees to contract for safety. Patient is medication compliant. Patient can be visible in the milieu at times but prefers to isolate to his room. He is pleasant and quiet.  Luxox increased to 150 mg HS. Patient is to be NPO 01.08.23 at midnight for ECT.  Q15 minute unit checks in place.

## 2022-10-22 NOTE — Progress Notes (Signed)
[facility] MD Progress Note  10/22/2022 2:51 PM Dillon [name]  MRN:  [id number] Subjective: Follow-up for this 72 year old man with depression and obsessive-compulsive disorder.  Patient says he is "all right" but admits that he still feels anxious and nervous.  He feels like he has been eating a little better but not a lot.  He spends time out in the day room around other people seems to have no trouble interacting with others.  Denies suicidal thoughts. Principal Problem: Severe recurrent major depression without psychotic features ([place]) Diagnosis: Principal Problem:   Severe recurrent major depression without psychotic features ([place])  Total Time spent with patient: 30 minutes  Past Psychiatric History: Past history of OCD and depression.  Positive response to ECT  Past Medical History:  Past Medical History:  Diagnosis Date   Kidney stone    Meningitis    spinal   OCD (obsessive compulsive disorder)    Shingles     Past Surgical History:  Procedure Laterality Date   CYSTOSCOPY W/ URETERAL STENT PLACEMENT Right 10/12/2022   Procedure: CYSTOSCOPY WITH RETROGRADE PYELOGRAM/URETERAL STENT PLACEMENT;  Surgeon: Abbie Sons, MD;  Location: [facility] ORS;  Service: Urology;  Laterality: Right;   KIDNEY STONE SURGERY     Family History:  Family History  Problem Relation Age of Onset   Cancer Mother        bladder   Emphysema Father    Emphysema Sister    COPD Sister    Cancer Brother        prostate   Family Psychiatric  History: See previous Social History:  Social History   Substance and Sexual Activity  Alcohol Use No   Alcohol/week: 0.0 standard drinks of alcohol     Social History   Substance and Sexual Activity  Drug Use No    Social History   Socioeconomic History   Marital status: Widowed    Spouse name: Not on file   Number of children: Not on file   Years of education: Not on file   Highest education level: Not on file  Occupational History   Occupation:  retired  Tobacco Use   Smoking status: Never   Smokeless tobacco: Never  Vaping Use   Vaping Use: Never used  Substance and Sexual Activity   Alcohol use: No    Alcohol/week: 0.0 standard drinks of alcohol   Drug use: No   Sexual activity: Not Currently  Other Topics Concern   Not on file  Social History Narrative   Not on file   Social Determinants of Health   Financial Resource Strain: Low Risk  (02/10/2022)   Overall Financial Resource Strain (CARDIA)    Difficulty of Paying Living Expenses: Not hard at all  Food Insecurity: No Food Insecurity (10/19/2022)   Hunger Vital Sign    Worried About Running Out of Food in the Last Year: Never true    [place] in the Last Year: Never true  Transportation Needs: No Transportation Needs (10/19/2022)   PRAPARE - Hydrologist (Medical): No    Lack of Transportation (Non-Medical): No  Physical Activity: Inactive (02/10/2022)   Exercise Vital Sign    Days of Exercise per Week: 0 days    Minutes of Exercise per Session: 0 min  Stress: No Stress Concern Present (02/10/2022)   [place]    Feeling of Stress : Not at all  Social Connections: Moderately Integrated (  02/10/2022)   Social Connection and Isolation Panel [NHANES]    Frequency of Communication with Friends and Family: Twice a week    Frequency of Social Gatherings with Friends and Family: Twice a week    Attends Religious Services: 1 to 4 times per year    Active Member of [employer] or Organizations: No    Attends Music therapist: More than 4 times per year    Marital Status: Widowed   Additional Social History:                         Sleep: Fair  Appetite:  Fair  Current Medications: Current Facility-Administered Medications  Medication Dose Route Frequency Provider Last Rate Last Admin   0.9 %  sodium chloride infusion  500 mL Intravenous Once Jamaar Howes,  Madie Reno, MD       acetaminophen (TYLENOL) tablet 650 mg  650 mg Oral Q6H PRN Shiro Ellerman, Madie Reno, MD       alum & mag hydroxide-simeth (MAALOX/MYLANTA) 200-200-20 MG/5ML suspension 30 mL  30 mL Oral Q4H PRN Alyas Creary T, MD       cyanocobalamin (VITAMIN B12) tablet 1,000 mcg  1,000 mcg Oral Daily Bairon Klemann,  T, MD   1,000 mcg at 10/22/22 0958   feeding supplement (ENSURE ENLIVE / ENSURE PLUS) liquid 237 mL  237 mL Oral TID BM Yadier Bramhall,  T, MD   237 mL at 10/22/22 0959   fentaNYL (SUBLIMAZE) injection 25 mcg  25 mcg Intravenous Q5 min PRN [place] Service, Gijsbertus F, MD       fiber supplement (BANATROL TF) liquid 60 mL  60 mL Oral BID Cyenna Rebello T, MD   60 mL at 10/22/22 0958   fluvoxaMINE (LUVOX) tablet 150 mg  150 mg Oral QHS Fern Canova T, MD       insulin aspart (novoLOG) injection 0-9 Units  0-9 Units Subcutaneous TID AC & HS Shanine Kreiger,  T, MD   2 Units at 10/22/22 1226   ketorolac (TORADOL) 30 MG/ML injection 30 mg  30 mg Intravenous Once Orion Mole T, MD       magnesium hydroxide (MILK OF MAGNESIA) suspension 30 mL  30 mL Oral Daily PRN Imunique Samad,  T, MD   30 mL at 10/22/22 1222   multivitamin with minerals tablet 1 tablet  1 tablet Oral Daily Lylie Blacklock, Madie Reno, MD   1 tablet at 10/22/22 0958   OLANZapine (ZYPREXA) tablet 5 mg  5 mg Oral QHS Dvontae Ruan T, MD   5 mg at 10/21/22 2116   ondansetron (ZOFRAN) injection 4 mg  4 mg Intravenous Once PRN [place] Service, Jane Canary, MD       oxybutynin (DITROPAN) tablet 5 mg  5 mg Oral Q8H PRN Huldah Marin T, MD       rosuvastatin (CRESTOR) tablet 10 mg  10 mg Oral Daily , Madie Reno, MD   10 mg at 10/22/22 0957   senna-docusate (Senokot-S) tablet 1 tablet  1 tablet Oral BID , Madie Reno, MD   1 tablet at 10/22/22 0957   tamsulosin (FLOMAX) capsule 0.4 mg  0.4 mg Oral Daily , Madie Reno, MD   0.4 mg at 10/22/22 1610   Vitamin D (Ergocalciferol) (DRISDOL) 1.25 MG (50000 UNIT) capsule 50,000 Units  50,000 Units Oral Q7 days  , Madie Reno, MD   50,000 Units at 10/21/22 9604    Lab Results:  Results for orders placed or performed during the hospital  encounter of 10/19/22 (from the past 48 hour(s))  Glucose, capillary     Status: Abnormal   Collection Time: 10/20/22  4:36 PM  Result Value Ref Range   Glucose-Capillary 185 (H) 70 - 99 mg/dL    Comment: Glucose reference range applies only to samples taken after fasting for at least 8 hours.  Glucose, capillary     Status: Abnormal   Collection Time: 10/20/22  9:24 PM  Result Value Ref Range   Glucose-Capillary 120 (H) 70 - 99 mg/dL    Comment: Glucose reference range applies only to samples taken after fasting for at least 8 hours.  Glucose, capillary     Status: Abnormal   Collection Time: 10/21/22  7:35 AM  Result Value Ref Range   Glucose-Capillary 166 (H) 70 - 99 mg/dL    Comment: Glucose reference range applies only to samples taken after fasting for at least 8 hours.  Glucose, capillary     Status: Abnormal   Collection Time: 10/21/22 11:38 AM  Result Value Ref Range   Glucose-Capillary 139 (H) 70 - 99 mg/dL    Comment: Glucose reference range applies only to samples taken after fasting for at least 8 hours.  Glucose, capillary     Status: Abnormal   Collection Time: 10/21/22  4:13 PM  Result Value Ref Range   Glucose-Capillary 150 (H) 70 - 99 mg/dL    Comment: Glucose reference range applies only to samples taken after fasting for at least 8 hours.  Glucose, capillary     Status: Abnormal   Collection Time: 10/21/22  7:31 PM  Result Value Ref Range   Glucose-Capillary 135 (H) 70 - 99 mg/dL    Comment: Glucose reference range applies only to samples taken after fasting for at least 8 hours.  Glucose, capillary     Status: Abnormal   Collection Time: 10/22/22  7:37 AM  Result Value Ref Range   Glucose-Capillary 150 (H) 70 - 99 mg/dL    Comment: Glucose reference range applies only to samples taken after fasting for at least 8 hours.  Glucose,  capillary     Status: Abnormal   Collection Time: 10/22/22 11:47 AM  Result Value Ref Range   Glucose-Capillary 186 (H) 70 - 99 mg/dL    Comment: Glucose reference range applies only to samples taken after fasting for at least 8 hours.    Blood Alcohol level:  Lab Results  Component Value Date   ETH <5 01/04/2016   ETH <5 12/15/2015    Metabolic Disorder Labs: Lab Results  Component Value Date   HGBA1C 6.3 (H) 10/11/2022   MPG 134 10/11/2022   MPG 91 03/27/2017   Lab Results  Component Value Date   PROLACTIN 29.7 (H) 01/05/2016   Lab Results  Component Value Date   CHOL 144 10/11/2022   TRIG 148 10/11/2022   HDL 44 10/11/2022   CHOLHDL 4.6 03/27/2017   VLDL 23 03/27/2017   LDLCALC 74 10/11/2022   LDLCALC 45 04/08/2022    Physical Findings: AIMS:  , ,  ,  ,    CIWA:    COWS:     Musculoskeletal: Strength & Muscle Tone: within normal limits Gait & Station: normal Patient leans: N/A  Psychiatric Specialty Exam:  Presentation  General Appearance:  Appropriate for Environment  Eye Contact: Good  Speech: Clear and Coherent  Speech Volume: Normal  Handedness: Right   Mood and Affect  Mood: Anxious; Depressed  Affect: Congruent   Thought Process  Thought Processes: Linear  Descriptions of Associations:Intact  Orientation:Full (Time, Place and Person)  Thought Content:Paranoid Ideation ("I will have diarrhea if  I eat or drink")  History of Schizophrenia/Schizoaffective disorder:No  Duration of Psychotic Symptoms:No data recorded Hallucinations:No data recorded Ideas of Reference:Paranoia  Suicidal Thoughts:No data recorded Homicidal Thoughts:No data recorded  Sensorium  Memory: Immediate Good  Judgment: Poor  Insight: Fair   Community education officer  Concentration: Fair  Attention Span: Good  Recall: [employer] of Knowledge: Fair  Language: Fair   Psychomotor Activity  Psychomotor Activity:No data  recorded  Assets  Assets: Desire for Improvement; Financial Resources/Insurance; Housing   Sleep  Sleep:No data recorded   Physical Exam: Physical Exam Vitals and nursing note reviewed.  Constitutional:      Appearance: Normal appearance.  HENT:     Head: Normocephalic and atraumatic.     Mouth/Throat:     Pharynx: Oropharynx is clear.  Eyes:     Pupils: Pupils are equal, round, and reactive to light.  Cardiovascular:     Rate and Rhythm: Normal rate and regular rhythm.  Pulmonary:     Effort: Pulmonary effort is normal.     Breath sounds: Normal breath sounds.  Abdominal:     General: Abdomen is flat.     Palpations: Abdomen is soft.  Musculoskeletal:        General: Normal range of motion.  Skin:    General: Skin is warm and dry.  Neurological:     General: No focal deficit present.     Mental Status: He is alert. Mental status is at baseline.  Psychiatric:        Attention and Perception: Attention normal.        Mood and Affect: Mood is anxious. Affect is blunt.        Speech: Speech normal.        Behavior: Behavior is slowed.        Thought Content: Thought content normal.    Review of Systems  Constitutional: Negative.   HENT: Negative.    Eyes: Negative.   Respiratory: Negative.    Cardiovascular: Negative.   Gastrointestinal: Negative.   Musculoskeletal: Negative.   Skin: Negative.   Neurological: Negative.   Psychiatric/Behavioral:  The patient is nervous/anxious.    Blood pressure 116/84, pulse 99, temperature (!) 97.4 F (36.3 C), temperature source Oral, resp. rate 18, height 5\' 9"  (1.753 m), weight 85.5 kg, SpO2 100 %. Body mass index is 27.84 kg/m.   Treatment Plan Summary: Medication management and Plan increase Luvox to 150 mg a day.  He had previously been on as much as 200 for his OCD.  He is on the schedule for ECT on Monday.  Alethia Berthold, MD 10/22/2022, 2:51 PM

## 2022-10-23 DIAGNOSIS — F332 Major depressive disorder, recurrent severe without psychotic features: Secondary | ICD-10-CM | POA: Diagnosis not present

## 2022-10-23 LAB — GLUCOSE, CAPILLARY
Glucose-Capillary: 180 mg/dL — ABNORMAL HIGH (ref 70–99)
Glucose-Capillary: 189 mg/dL — ABNORMAL HIGH (ref 70–99)
Glucose-Capillary: 170 mg/dL — ABNORMAL HIGH (ref 70–99)
Glucose-Capillary: 152 mg/dL — ABNORMAL HIGH (ref 70–99)

## 2022-10-23 MED ORDER — POLYETHYLENE GLYCOL 3350 17 G PO PACK
17.0000 g | PACK | Freq: Every day | ORAL | Status: DC
  Administered 2022-10-23 – 2022-11-02 (×11): 17 g via ORAL
  Filled 2022-10-23 (×10): qty 1

## 2022-10-23 MED ORDER — DOCUSATE SODIUM 100 MG PO CAPS
100.0000 mg | ORAL_CAPSULE | Freq: Two times a day (BID) | ORAL | Status: DC
  Administered 2022-10-23 – 2022-11-02 (×19): 100 mg via ORAL
  Filled 2022-10-23 (×19): qty 1

## 2022-10-23 NOTE — Progress Notes (Signed)
Patient is alert and oriented times 4. Mood and affect appropriate. Patient rates pain as 0/10. She denies SI, HI, and AVH. Patient does endorse feelings of anxiety and depression at this time. Patient states she slept well last night. Evening medicines administered whole by mouth without difficulty. Patient ate snack in day room; appetite was fair. Patient remains on unit with Q15 minute checks in place.   

## 2022-10-23 NOTE — Plan of Care (Signed)
D- Patient alert and oriented. Patient presents with a pleasant and appropriate mood and affect. Patient denies SI, HI, AVH, and pain. Patient endorses anxiety and depression. Patient states that his goal is continue to work on getting himself together and decrease his anxiety and depression.    A- Scheduled medications administered to patient, per MD orders. Support and encouragement provided.  Routine safety checks conducted every 15 minutes.  Patient informed to notify staff with problems or concerns.  R- No adverse drug reactions noted. Patient contracts for safety at this time. Patient compliant with medications and treatment plan. Patient receptive, calm, and cooperative. Patient interacts well with others on the unit.  Patient remains safe at this time.  Problem: Education: Goal: Knowledge of General Education information will improve Description: Including pain rating scale, medication(s)/side effects and non-pharmacologic comfort measures Outcome: Progressing   Problem: Health Behavior/Discharge Planning: Goal: Ability to manage health-related needs will improve Outcome: Progressing   Problem: Clinical Measurements: Goal: Ability to maintain clinical measurements within normal limits will improve Outcome: Progressing Goal: Will remain free from infection Outcome: Progressing Goal: Diagnostic test results will improve Outcome: Progressing Goal: Respiratory complications will improve Outcome: Progressing Goal: Cardiovascular complication will be avoided Outcome: Progressing   Problem: Activity: Goal: Risk for activity intolerance will decrease Outcome: Progressing   Problem: Nutrition: Goal: Adequate nutrition will be maintained Outcome: Progressing   Problem: Coping: Goal: Level of anxiety will decrease Outcome: Progressing   Problem: Elimination: Goal: Will not experience complications related to bowel motility Outcome: Progressing Goal: Will not experience  complications related to urinary retention Outcome: Progressing   Problem: Pain Managment: Goal: General experience of comfort will improve Outcome: Progressing   Problem: Skin Integrity: Goal: Risk for impaired skin integrity will decrease Outcome: Progressing   Problem: Clinical Measurements: Goal: Diagnostic test results will improve Outcome: Progressing Goal: Signs and symptoms of infection will decrease Outcome: Progressing   Problem: Education: Goal: Utilization of techniques to improve thought processes will improve Outcome: Progressing Goal: Knowledge of the prescribed therapeutic regimen will improve Outcome: Progressing   Problem: Respiratory: Goal: Ability to maintain adequate ventilation will improve Outcome: Progressing

## 2022-10-23 NOTE — Progress Notes (Signed)
[facility] MD Progress Note  10/23/2022 2:51 PM Dillon [name]  MRN:  [id number] Subjective: Follow-up patient with major depression.  Patient reports mood is good.  No suicidal thought.  Still has obsessive anxiety but is making himself eat more. Principal Problem: Severe recurrent major depression without psychotic features (HCC) Diagnosis: Principal Problem:   Severe recurrent major depression without psychotic features (HCC)  Total Time spent with patient: 30 minutes  Past Psychiatric History: Past history of OCD and depression  Past Medical History:  Past Medical History:  Diagnosis Date   Kidney stone    Meningitis    spinal   OCD (obsessive compulsive disorder)    Shingles     Past Surgical History:  Procedure Laterality Date   CYSTOSCOPY W/ URETERAL STENT PLACEMENT Right 10/12/2022   Procedure: CYSTOSCOPY WITH RETROGRADE PYELOGRAM/URETERAL STENT PLACEMENT;  Surgeon: Riki Altes, MD;  Location: [facility] ORS;  Service: Urology;  Laterality: Right;   KIDNEY STONE SURGERY     Family History:  Family History  Problem Relation Age of Onset   Cancer Mother        bladder   Emphysema Father    Emphysema Sister    COPD Sister    Cancer Brother        prostate   Family Psychiatric  History: See previous Social History:  Social History   Substance and Sexual Activity  Alcohol Use No   Alcohol/week: 0.0 standard drinks of alcohol     Social History   Substance and Sexual Activity  Drug Use No    Social History   Socioeconomic History   Marital status: Widowed    Spouse name: Not on file   Number of children: Not on file   Years of education: Not on file   Highest education level: Not on file  Occupational History   Occupation: retired  Tobacco Use   Smoking status: Never   Smokeless tobacco: Never  Vaping Use   Vaping Use: Never used  Substance and Sexual Activity   Alcohol use: No    Alcohol/week: 0.0 standard drinks of alcohol   Drug use: No   Sexual  activity: Not Currently  Other Topics Concern   Not on file  Social History Narrative   Not on file   Social Determinants of Health   Financial Resource Strain: Low Risk  (02/10/2022)   Overall Financial Resource Strain (CARDIA)    Difficulty of Paying Living Expenses: Not hard at all  Food Insecurity: No Food Insecurity (10/19/2022)   Hunger Vital Sign    Worried About Running Out of Food in the Last Year: Never true    Ran Out of Food in the Last Year: Never true  Transportation Needs: No Transportation Needs (10/19/2022)   PRAPARE - Administrator, Civil Service (Medical): No    Lack of Transportation (Non-Medical): No  Physical Activity: Inactive (02/10/2022)   Exercise Vital Sign    Days of Exercise per Week: 0 days    Minutes of Exercise per Session: 0 min  Stress: No Stress Concern Present (02/10/2022)   [name] of Occupational Health - Occupational Stress Questionnaire    Feeling of Stress : Not at all  Social Connections: Moderately Integrated (02/10/2022)   Social Connection and Isolation Panel [NHANES]    Frequency of Communication with Friends and Family: Twice a week    Frequency of Social Gatherings with Friends and Family: Twice a week    Attends Religious Services: 1 to  4 times per year    Active Member of Clubs or Organizations: No    Attends Banker Meetings: More than 4 times per year    Marital Status: Widowed   Additional Social History:                         Sleep: Fair  Appetite:  Fair  Current Medications: Current Facility-Administered Medications  Medication Dose Route Frequency Provider Last Rate Last Admin   0.9 %  sodium chloride infusion  500 mL Intravenous Once Breion Novacek, Jackquline Denmark, MD       acetaminophen (TYLENOL) tablet 650 mg  650 mg Oral Q6H PRN [place] Welliver, Jackquline Denmark, MD       alum & mag hydroxide-simeth (MAALOX/MYLANTA) 200-200-20 MG/5ML suspension 30 mL  30 mL Oral Q4H PRN Kysean Sweet T, MD        cyanocobalamin (VITAMIN B12) tablet 1,000 mcg  1,000 mcg Oral Daily Jeffery Gammell,  T, MD   1,000 mcg at 10/23/22 0926   feeding supplement (ENSURE ENLIVE / ENSURE PLUS) liquid 237 mL  237 mL Oral TID BM Maggie Dworkin,  T, MD   237 mL at 10/23/22 1352   fentaNYL (SUBLIMAZE) injection 25 mcg  25 mcg Intravenous Q5 min PRN Darleene Cleaver, Gijsbertus F, MD       fiber supplement (BANATROL TF) liquid 60 mL  60 mL Oral BID Russell Engelstad T, MD   60 mL at 10/23/22 0927   fluvoxaMINE (LUVOX) tablet 150 mg  150 mg Oral QHS Shaquanta Harkless T, MD   150 mg at 10/22/22 2115   insulin aspart (novoLOG) injection 0-9 Units  0-9 Units Subcutaneous TID AC & HS Shallen Luedke,  T, MD   2 Units at 10/23/22 1216   ketorolac (TORADOL) 30 MG/ML injection 30 mg  30 mg Intravenous Once Akashdeep Chuba T, MD       magnesium hydroxide (MILK OF MAGNESIA) suspension 30 mL  30 mL Oral Daily PRN Brettney Ficken,  T, MD   30 mL at 10/22/22 1222   multivitamin with minerals tablet 1 tablet  1 tablet Oral Daily Rayaan Garguilo, Jackquline Denmark, MD   1 tablet at 10/23/22 0926   OLANZapine (ZYPREXA) tablet 5 mg  5 mg Oral QHS Hawa Henly T, MD   5 mg at 10/22/22 2115   ondansetron (ZOFRAN) injection 4 mg  4 mg Intravenous Once PRN Darleene Cleaver, Gerrit Heck, MD       oxybutynin (DITROPAN) tablet 5 mg  5 mg Oral Q8H PRN Ellyanna Holton T, MD       rosuvastatin (CRESTOR) tablet 10 mg  10 mg Oral Daily , Jackquline Denmark, MD   10 mg at 10/23/22 7564   senna-docusate (Senokot-S) tablet 1 tablet  1 tablet Oral BID , Jackquline Denmark, MD   1 tablet at 10/23/22 0926   tamsulosin (FLOMAX) capsule 0.4 mg  0.4 mg Oral Daily , Jackquline Denmark, MD   0.4 mg at 10/23/22 3329   Vitamin D (Ergocalciferol) (DRISDOL) 1.25 MG (50000 UNIT) capsule 50,000 Units  50,000 Units Oral Q7 days , Jackquline Denmark, MD   50,000 Units at 10/21/22 5188    Lab Results:  Results for orders placed or performed during the hospital encounter of 10/19/22 (from the past 48 hour(s))  Glucose, capillary     Status:  Abnormal   Collection Time: 10/21/22  4:13 PM  Result Value Ref Range   Glucose-Capillary 150 (H) 70 - 99 mg/dL  Comment: Glucose reference range applies only to samples taken after fasting for at least 8 hours.  Glucose, capillary     Status: Abnormal   Collection Time: 10/21/22  7:31 PM  Result Value Ref Range   Glucose-Capillary 135 (H) 70 - 99 mg/dL    Comment: Glucose reference range applies only to samples taken after fasting for at least 8 hours.  Glucose, capillary     Status: Abnormal   Collection Time: 10/22/22  7:37 AM  Result Value Ref Range   Glucose-Capillary 150 (H) 70 - 99 mg/dL    Comment: Glucose reference range applies only to samples taken after fasting for at least 8 hours.  Glucose, capillary     Status: Abnormal   Collection Time: 10/22/22 11:47 AM  Result Value Ref Range   Glucose-Capillary 186 (H) 70 - 99 mg/dL    Comment: Glucose reference range applies only to samples taken after fasting for at least 8 hours.  Glucose, capillary     Status: Abnormal   Collection Time: 10/22/22  4:29 PM  Result Value Ref Range   Glucose-Capillary 147 (H) 70 - 99 mg/dL    Comment: Glucose reference range applies only to samples taken after fasting for at least 8 hours.  Glucose, capillary     Status: Abnormal   Collection Time: 10/22/22  7:25 PM  Result Value Ref Range   Glucose-Capillary 158 (H) 70 - 99 mg/dL    Comment: Glucose reference range applies only to samples taken after fasting for at least 8 hours.  Glucose, capillary     Status: Abnormal   Collection Time: 10/23/22  7:32 AM  Result Value Ref Range   Glucose-Capillary 180 (H) 70 - 99 mg/dL    Comment: Glucose reference range applies only to samples taken after fasting for at least 8 hours.  Glucose, capillary     Status: Abnormal   Collection Time: 10/23/22 11:52 AM  Result Value Ref Range   Glucose-Capillary 189 (H) 70 - 99 mg/dL    Comment: Glucose reference range applies only to samples taken after  fasting for at least 8 hours.    Blood Alcohol level:  Lab Results  Component Value Date   ETH <5 01/04/2016   ETH <5 41/66/0630    Metabolic Disorder Labs: Lab Results  Component Value Date   HGBA1C 6.3 (H) 10/11/2022   MPG 134 10/11/2022   MPG 91 03/27/2017   Lab Results  Component Value Date   PROLACTIN 29.7 (H) 01/05/2016   Lab Results  Component Value Date   CHOL 144 10/11/2022   TRIG 148 10/11/2022   HDL 44 10/11/2022   CHOLHDL 4.6 03/27/2017   VLDL 23 03/27/2017   LDLCALC 74 10/11/2022   LDLCALC 45 04/08/2022    Physical Findings: AIMS:  , ,  ,  ,    CIWA:    COWS:     Musculoskeletal: Strength & Muscle Tone: within normal limits Gait & Station: normal Patient leans: N/A  Psychiatric Specialty Exam:  Presentation  General Appearance:  Appropriate for Environment  Eye Contact: Good  Speech: Clear and Coherent  Speech Volume: Normal  Handedness: Right   Mood and Affect  Mood: Anxious; Depressed  Affect: Congruent   Thought Process  Thought Processes: Linear  Descriptions of Associations:Intact  Orientation:Full (Time, Place and Person)  Thought Content:Paranoid Ideation ("I will have diarrhea if  I eat or drink")  History of Schizophrenia/Schizoaffective disorder:No  Duration of Psychotic Symptoms:No data recorded Hallucinations:No data  recorded Ideas of Reference:Paranoia  Suicidal Thoughts:No data recorded Homicidal Thoughts:No data recorded  Sensorium  Memory: Immediate Good  Judgment: Poor  Insight: Fair   Art therapist  Concentration: Fair  Attention Span: Good  Recall: Fair  Fund of Knowledge: Fair  Language: Fair   Psychomotor Activity  Psychomotor Activity:No data recorded  Assets  Assets: Desire for Improvement; Financial Resources/Insurance; Housing   Sleep  Sleep:No data recorded   Physical Exam: Physical Exam Vitals reviewed.  Constitutional:      Appearance:  Normal appearance.  HENT:     Head: Normocephalic and atraumatic.     Mouth/Throat:     Pharynx: Oropharynx is clear.  Eyes:     Pupils: Pupils are equal, round, and reactive to light.  Cardiovascular:     Rate and Rhythm: Normal rate and regular rhythm.  Pulmonary:     Effort: Pulmonary effort is normal.     Breath sounds: Normal breath sounds.  Abdominal:     General: Abdomen is flat.     Palpations: Abdomen is soft.  Musculoskeletal:        General: Normal range of motion.  Skin:    General: Skin is warm and dry.  Neurological:     General: No focal deficit present.     Mental Status: He is alert. Mental status is at baseline.  Psychiatric:        Mood and Affect: Mood normal.        Thought Content: Thought content normal.    Review of Systems  Constitutional: Negative.   HENT: Negative.    Eyes: Negative.   Respiratory: Negative.    Cardiovascular: Negative.   Gastrointestinal: Negative.   Musculoskeletal: Negative.   Skin: Negative.   Neurological: Negative.   Psychiatric/Behavioral: Negative.     Blood pressure 106/82, pulse (!) 110, temperature (!) 97.5 F (36.4 C), resp. rate 18, height 5\' 9"  (1.753 m), weight 85.5 kg, SpO2 100 %. Body mass index is 27.84 kg/m.   Treatment Plan Summary: Medication management and Plan I increased his dose of Luvox.  He is on the schedule for ECT tomorrow.  He seems to be stabilizing and might be ready for discharge relatively soon.  , MD 10/23/2022, 2:51 PM

## 2022-10-24 ENCOUNTER — Encounter: Payer: Self-pay | Admitting: Psychiatry

## 2022-10-24 ENCOUNTER — Inpatient Hospital Stay: Payer: Medicare HMO | Admitting: Registered Nurse

## 2022-10-24 ENCOUNTER — Ambulatory Visit: Payer: Medicare HMO

## 2022-10-24 DIAGNOSIS — E1165 Type 2 diabetes mellitus with hyperglycemia: Secondary | ICD-10-CM | POA: Diagnosis not present

## 2022-10-24 DIAGNOSIS — T7840XA Allergy, unspecified, initial encounter: Secondary | ICD-10-CM | POA: Diagnosis not present

## 2022-10-24 DIAGNOSIS — F418 Other specified anxiety disorders: Secondary | ICD-10-CM | POA: Diagnosis not present

## 2022-10-24 DIAGNOSIS — F332 Major depressive disorder, recurrent severe without psychotic features: Secondary | ICD-10-CM | POA: Diagnosis not present

## 2022-10-24 LAB — GLUCOSE, CAPILLARY
Glucose-Capillary: 158 mg/dL — ABNORMAL HIGH (ref 70–99)
Glucose-Capillary: 194 mg/dL — ABNORMAL HIGH (ref 70–99)
Glucose-Capillary: 146 mg/dL — ABNORMAL HIGH (ref 70–99)

## 2022-10-24 MED ORDER — PROPOFOL 10 MG/ML IV BOLUS
INTRAVENOUS | Status: AC
  Filled 2022-10-24: qty 20

## 2022-10-24 MED ORDER — KETAMINE HCL 50 MG/5ML IJ SOSY
PREFILLED_SYRINGE | INTRAMUSCULAR | Status: AC
  Filled 2022-10-24: qty 5

## 2022-10-24 MED ORDER — PROPOFOL 10 MG/ML IV BOLUS
INTRAVENOUS | Status: DC | PRN
  Administered 2022-10-24: 40 mg via INTRAVENOUS

## 2022-10-24 MED ORDER — LABETALOL HCL 5 MG/ML IV SOLN
INTRAVENOUS | Status: DC | PRN
  Administered 2022-10-24: 10 mg via INTRAVENOUS

## 2022-10-24 MED ORDER — METHOHEXITAL SODIUM 0.5 G IJ SOLR
INTRAMUSCULAR | Status: AC
  Filled 2022-10-24: qty 500

## 2022-10-24 MED ORDER — LABETALOL HCL 5 MG/ML IV SOLN
INTRAVENOUS | Status: AC
  Filled 2022-10-24: qty 4

## 2022-10-24 MED ORDER — SODIUM CHLORIDE 0.9 % IV SOLN
500.0000 mL | Freq: Once | INTRAVENOUS | Status: AC
  Administered 2022-10-24: 500 mL via INTRAVENOUS

## 2022-10-24 MED ORDER — SUCCINYLCHOLINE CHLORIDE 200 MG/10ML IV SOSY
PREFILLED_SYRINGE | INTRAVENOUS | Status: DC | PRN
  Administered 2022-10-24: 80 mg via INTRAVENOUS

## 2022-10-24 MED ORDER — KETAMINE HCL 10 MG/ML IJ SOLN
INTRAMUSCULAR | Status: DC | PRN
  Administered 2022-10-24: 40 mg via INTRAVENOUS

## 2022-10-24 NOTE — Plan of Care (Signed)
  Problem: Education: Goal: Knowledge of General Education information will improve Description: Including pain rating scale, medication(s)/side effects and non-pharmacologic comfort measures Outcome: Progressing   Problem: Health Behavior/Discharge Planning: Goal: Ability to manage health-related needs will improve Outcome: Progressing   Problem: Clinical Measurements: Goal: Ability to maintain clinical measurements within normal limits will improve Outcome: Progressing Goal: Will remain free from infection Outcome: Progressing Goal: Diagnostic test results will improve Outcome: Progressing Goal: Respiratory complications will improve Outcome: Progressing Goal: Cardiovascular complication will be avoided Outcome: Progressing   Problem: Activity: Goal: Risk for activity intolerance will decrease Outcome: Progressing   Problem: Nutrition: Goal: Adequate nutrition will be maintained Outcome: Progressing   Problem: Coping: Goal: Level of anxiety will decrease Outcome: Progressing   Problem: Elimination: Goal: Will not experience complications related to bowel motility Outcome: Progressing Goal: Will not experience complications related to urinary retention Outcome: Progressing   Problem: Pain Managment: Goal: General experience of comfort will improve Outcome: Progressing   Problem: Safety: Goal: Ability to remain free from injury will improve Outcome: Progressing   Problem: Skin Integrity: Goal: Risk for impaired skin integrity will decrease Outcome: Progressing   Problem: Fluid Volume: Goal: Hemodynamic stability will improve Outcome: Progressing   Problem: Clinical Measurements: Goal: Diagnostic test results will improve Outcome: Progressing Goal: Signs and symptoms of infection will decrease Outcome: Progressing   Problem: Respiratory: Goal: Ability to maintain adequate ventilation will improve Outcome: Progressing   Problem: Education: Goal:  Utilization of techniques to improve thought processes will improve Outcome: Progressing Goal: Knowledge of the prescribed therapeutic regimen will improve Outcome: Progressing   Problem: Activity: Goal: Interest or engagement in leisure activities will improve Outcome: Progressing Goal: Imbalance in normal sleep/wake cycle will improve Outcome: Progressing   Problem: Coping: Goal: Coping ability will improve Outcome: Progressing Goal: Will verbalize feelings Outcome: Progressing   Problem: Health Behavior/Discharge Planning: Goal: Ability to make decisions will improve Outcome: Progressing Goal: Compliance with therapeutic regimen will improve Outcome: Progressing   Problem: Role Relationship: Goal: Will demonstrate positive changes in social behaviors and relationships Outcome: Progressing   Problem: Safety: Goal: Ability to disclose and discuss suicidal ideas will improve Outcome: Progressing Goal: Ability to identify and utilize support systems that promote safety will improve Outcome: Progressing   Problem: Self-Concept: Goal: Will verbalize positive feelings about self Outcome: Progressing Goal: Level of anxiety will decrease Outcome: Progressing   

## 2022-10-24 NOTE — H&P (Signed)
Dillon [name] is an 72 y.o. male.   Chief Complaint: Patient denies being depressed admits still being anxious.  He was very tired and withdrawn today before treatment for what ever reason HPI: History of OCD and depression  Past Medical History:  Diagnosis Date   Kidney stone    Meningitis    spinal   OCD (obsessive compulsive disorder)    Shingles     Past Surgical History:  Procedure Laterality Date   CYSTOSCOPY W/ URETERAL STENT PLACEMENT Right 10/12/2022   Procedure: CYSTOSCOPY WITH RETROGRADE PYELOGRAM/URETERAL STENT PLACEMENT;  Surgeon: Abbie Sons, MD;  Location: [facility] ORS;  Service: Urology;  Laterality: Right;   KIDNEY STONE SURGERY      Family History  Problem Relation Age of Onset   Cancer Mother        bladder   Emphysema Father    Emphysema Sister    COPD Sister    Cancer Brother        prostate   Social History:  reports that he has never smoked. He has never used smokeless tobacco. He reports that he does not drink alcohol and does not use drugs.  Allergies:  Allergies  Allergen Reactions   Codeine Other (See Comments)    Unsure of reaction    Medications Prior to Admission  Medication Sig Dispense Refill   cyanocobalamin (VITAMIN B12) 1000 MCG tablet Take 1 tablet (1,000 mcg total) by mouth daily. 90 tablet 4   fluvoxaMINE (LUVOX) 100 MG tablet Take 1 tablet (100 mg total) by mouth at bedtime.     glucose blood (ACCU-CHEK GUIDE) test strip Use to check blood sugars 2-3 times daily with goals = <130 fasting in morning and <180 two hours after eating. Bring blood sugar log to appointments. 100 each 12   lisinopril (ZESTRIL) 5 MG tablet Take 1 tablet (5 mg total) by mouth daily. 90 tablet 4   OLANZapine (ZYPREXA) 5 MG tablet Take 5 mg by mouth daily.     oxybutynin (DITROPAN) 5 MG tablet Take 1 tablet (5 mg total) by mouth every 8 (eight) hours as needed for bladder spasms (frequency,urgency).     rosuvastatin (CRESTOR) 10 MG tablet Take 1 tablet  (10 mg total) by mouth daily. 90 tablet 4   sitaGLIPtin (JANUVIA) 100 MG tablet Take 1 tablet (100 mg total) by mouth daily. 30 tablet 3   tamsulosin (FLOMAX) 0.4 MG CAPS capsule Take 1 capsule (0.4 mg total) by mouth daily. 30 capsule    Vitamin D, Ergocalciferol, (DRISDOL) 1.25 MG (50000 UNIT) CAPS capsule Take 1 capsule (50,000 Units total) by mouth every 7 (seven) days. 12 capsule 0    Results for orders placed or performed during the hospital encounter of 10/19/22 (from the past 48 hour(s))  Glucose, capillary     Status: Abnormal   Collection Time: 10/22/22  4:29 PM  Result Value Ref Range   Glucose-Capillary 147 (H) 70 - 99 mg/dL    Comment: Glucose reference range applies only to samples taken after fasting for at least 8 hours.  Glucose, capillary     Status: Abnormal   Collection Time: 10/22/22  7:25 PM  Result Value Ref Range   Glucose-Capillary 158 (H) 70 - 99 mg/dL    Comment: Glucose reference range applies only to samples taken after fasting for at least 8 hours.  Glucose, capillary     Status: Abnormal   Collection Time: 10/23/22  7:32 AM  Result Value Ref Range  Glucose-Capillary 180 (H) 70 - 99 mg/dL    Comment: Glucose reference range applies only to samples taken after fasting for at least 8 hours.  Glucose, capillary     Status: Abnormal   Collection Time: 10/23/22 11:52 AM  Result Value Ref Range   Glucose-Capillary 189 (H) 70 - 99 mg/dL    Comment: Glucose reference range applies only to samples taken after fasting for at least 8 hours.  Glucose, capillary     Status: Abnormal   Collection Time: 10/23/22  4:44 PM  Result Value Ref Range   Glucose-Capillary 170 (H) 70 - 99 mg/dL    Comment: Glucose reference range applies only to samples taken after fasting for at least 8 hours.  Glucose, capillary     Status: Abnormal   Collection Time: 10/23/22  8:12 PM  Result Value Ref Range   Glucose-Capillary 152 (H) 70 - 99 mg/dL    Comment: Glucose reference range  applies only to samples taken after fasting for at least 8 hours.  Glucose, capillary     Status: Abnormal   Collection Time: 10/24/22  7:44 AM  Result Value Ref Range   Glucose-Capillary 158 (H) 70 - 99 mg/dL    Comment: Glucose reference range applies only to samples taken after fasting for at least 8 hours.   No results found.  Review of Systems  Constitutional: Negative.   HENT: Negative.    Eyes: Negative.   Respiratory: Negative.    Cardiovascular: Negative.   Gastrointestinal: Negative.   Musculoskeletal: Negative.   Skin: Negative.   Neurological: Negative.   Psychiatric/Behavioral:  The patient is nervous/anxious.     Blood pressure 121/79, pulse 66, temperature 97.7 F (36.5 C), resp. rate 16, height 5\' 9"  (1.753 m), weight 85.5 kg, SpO2 98 %. Physical Exam Constitutional:      Appearance: He is well-developed.  HENT:     Head: Normocephalic and atraumatic.  Eyes:     Conjunctiva/sclera: Conjunctivae normal.     Pupils: Pupils are equal, round, and reactive to light.  Cardiovascular:     Heart sounds: Normal heart sounds.  Pulmonary:     Effort: Pulmonary effort is normal.  Abdominal:     Palpations: Abdomen is soft.  Musculoskeletal:        General: Normal range of motion.     Cervical back: Normal range of motion.  Skin:    General: Skin is warm and dry.  Neurological:     Mental Status: He is alert.  Psychiatric:        Attention and Perception: He is inattentive.        Mood and Affect: Affect is blunt.        Speech: Speech is delayed.        Behavior: Behavior is cooperative.      Assessment/Plan Treatment today and then reassess over the next couple days as to whether we really need to continue a full course given that his depression seems to be improved already.  , MD 10/24/2022, 2:21 PM

## 2022-10-24 NOTE — Anesthesia Postprocedure Evaluation (Signed)
Anesthesia Post Note  Patient: Dillon [name]  Procedure(s) Performed: ECT TX  Patient location during evaluation: PACU Anesthesia Type: General Level of consciousness: awake and alert Pain management: pain level controlled Vital Signs Assessment: post-procedure vital signs reviewed and stable Respiratory status: spontaneous breathing, nonlabored ventilation, respiratory function stable and patient connected to nasal cannula oxygen Cardiovascular status: blood pressure returned to baseline and stable Postop Assessment: no apparent nausea or vomiting Anesthetic complications: no   No notable events documented.   Last Vitals:  Vitals:   10/24/22 1340 10/24/22 1349  BP: 115/82 129/82  Pulse: 73 68  Resp: 19 20  Temp:  36.4 C  SpO2: 99% 98%    Last Pain:  Vitals:   10/24/22 1349  TempSrc:   PainSc: 0-No pain                 Precious Haws Relda Agosto

## 2022-10-24 NOTE — Anesthesia Preprocedure Evaluation (Signed)
Anesthesia Evaluation  Patient identified by MRN, date of birth, ID band Patient awake    Reviewed: Allergy & Precautions, NPO status , Patient's Chart, lab work & pertinent test results  Airway Mallampati: III  TM Distance: >3 FB     Dental  (+) Chipped   Pulmonary neg pulmonary ROS    + decreased breath sounds      Cardiovascular Exercise Tolerance: Good + angina   Rate:Tachycardia     Neuro/Psych   Anxiety Depression    negative neurological ROS     GI/Hepatic negative GI ROS, Neg liver ROS,,,  Endo/Other  diabetes    Renal/GU Renal disease  negative genitourinary   Musculoskeletal   Abdominal   Peds negative pediatric ROS (+)  Hematology   Anesthesia Other Findings   Reproductive/Obstetrics                              Anesthesia Physical Anesthesia Plan  ASA: 3  Anesthesia Plan: General   Post-op Pain Management:    Induction: Intravenous  PONV Risk Score and Plan:   Airway Management Planned: Natural Airway  Additional Equipment:   Intra-op Plan:   Post-operative Plan:   Informed Consent: I have reviewed the patients History and Physical, chart, labs and discussed the procedure including the risks, benefits and alternatives for the proposed anesthesia with the patient or authorized representative who has indicated his/her understanding and acceptance.       Plan Discussed with: CRNA and Surgeon  Anesthesia Plan Comments:         Anesthesia Quick Evaluation  

## 2022-10-24 NOTE — Group Note (Signed)
LCSW Group Therapy Note  Group Date: 10/24/2022 Start Time: 4010 End Time: 1630   Type of Therapy and Topic:  Group Therapy - Healthy vs Unhealthy Coping Skills  Participation Level:  Did Not Attend   Description of Group The focus of this group was to determine what unhealthy coping techniques typically are used by group members and what healthy coping techniques would be helpful in coping with various problems. Patients were guided in becoming aware of the differences between healthy and unhealthy coping techniques. Patients were asked to identify 2-3 healthy coping skills they would like to learn to use more effectively.  Therapeutic Goals Patients learned that coping is what human beings do all day long to deal with various situations in their lives Patients defined and discussed healthy vs unhealthy coping techniques Patients identified their preferred coping techniques and identified whether these were healthy or unhealthy Patients determined 2-3 healthy coping skills they would like to become more familiar with and use more often. Patients provided support and ideas to each other   Summary of Patient Progress:  X  Therapeutic Modalities Cognitive Behavioral Therapy Motivational Interviewing  Minka Knight A [place], LCSWA 10/24/2022  4:38 PM

## 2022-10-24 NOTE — Progress Notes (Signed)
[facility] MD Progress Note  10/24/2022 2:58 PM Dillon [name]  MRN:  [id number] Subjective: Dillon [name] is seen on rounds.  He has returned from ECT.  States that it went pretty well.  He says that he still feels depressed.  He has been compliant with medications.  Dr. Toni Amend went up on his Luvox this weekend.  He is doing well with Zyprexa at bedtime.  His appetite has slowly improved.  He does not have any complaints. Principal Problem: Severe recurrent major depression without psychotic features (HCC) Diagnosis: Principal Problem:   Severe recurrent major depression without psychotic features (HCC)  Total Time spent with patient: 15 minutes  Past Psychiatric History: ECT back in 2018. He has been on Luvox in the past. I believe he was supposed to follow-up at [facility] at that time.   Past Medical History:  Past Medical History:  Diagnosis Date   Kidney stone    Meningitis    spinal   OCD (obsessive compulsive disorder)    Shingles     Past Surgical History:  Procedure Laterality Date   CYSTOSCOPY W/ URETERAL STENT PLACEMENT Right 10/12/2022   Procedure: CYSTOSCOPY WITH RETROGRADE PYELOGRAM/URETERAL STENT PLACEMENT;  Surgeon: Riki Altes, MD;  Location: [facility] ORS;  Service: Urology;  Laterality: Right;   KIDNEY STONE SURGERY     Family History:  Family History  Problem Relation Age of Onset   Cancer Mother        bladder   Emphysema Father    Emphysema Sister    COPD Sister    Cancer Brother        prostate   Family Psychiatric  History: Unremarkable Social History:  Social History   Substance and Sexual Activity  Alcohol Use No   Alcohol/week: 0.0 standard drinks of alcohol     Social History   Substance and Sexual Activity  Drug Use No    Social History   Socioeconomic History   Marital status: Widowed    Spouse name: Not on file   Number of children: Not on file   Years of education: Not on file   Highest education level: Not on file  Occupational History    Occupation: retired  Tobacco Use   Smoking status: Never   Smokeless tobacco: Never  Vaping Use   Vaping Use: Never used  Substance and Sexual Activity   Alcohol use: No    Alcohol/week: 0.0 standard drinks of alcohol   Drug use: No   Sexual activity: Not Currently  Other Topics Concern   Not on file  Social History Narrative   Not on file   Social Determinants of Health   Financial Resource Strain: Low Risk  (02/10/2022)   Overall Financial Resource Strain (CARDIA)    Difficulty of Paying Living Expenses: Not hard at all  Food Insecurity: No Food Insecurity (10/19/2022)   Hunger Vital Sign    Worried About Running Out of Food in the Last Year: Never true    Ran Out of Food in the Last Year: Never true  Transportation Needs: No Transportation Needs (10/19/2022)   PRAPARE - Administrator, Civil Service (Medical): No    Lack of Transportation (Non-Medical): No  Physical Activity: Inactive (02/10/2022)   Exercise Vital Sign    Days of Exercise per Week: 0 days    Minutes of Exercise per Session: 0 min  Stress: No Stress Concern Present (02/10/2022)   [name] of Occupational Health - Occupational Stress Questionnaire  Feeling of Stress : Not at all  Social Connections: Moderately Integrated (02/10/2022)   Social Connection and Isolation Panel [NHANES]    Frequency of Communication with Friends and Family: Twice a week    Frequency of Social Gatherings with Friends and Family: Twice a week    Attends Religious Services: 1 to 4 times per year    Active Member of [employer] or Organizations: No    Attends Music therapist: More than 4 times per year    Marital Status: Widowed   Additional Social History:                         Sleep: Good  Appetite:  Fair  Current Medications: Current Facility-Administered Medications  Medication Dose Route Frequency Provider Last Rate Last Admin   acetaminophen (TYLENOL) tablet 650 mg  650 mg Oral  Q6H PRN Clapacs, John T, MD       alum & mag hydroxide-simeth (MAALOX/MYLANTA) 200-200-20 MG/5ML suspension 30 mL  30 mL Oral Q4H PRN Clapacs, John T, MD       cyanocobalamin (VITAMIN B12) tablet 1,000 mcg  1,000 mcg Oral Daily Clapacs, John T, MD   1,000 mcg at 10/23/22 0926   docusate sodium (COLACE) capsule 100 mg  100 mg Oral BID Clapacs, John T, MD   100 mg at 10/23/22 2126   feeding supplement (ENSURE ENLIVE / ENSURE PLUS) liquid 237 mL  237 mL Oral TID BM Clapacs, John T, MD   237 mL at 10/23/22 1352   fentaNYL (SUBLIMAZE) injection 25 mcg  25 mcg Intravenous Q5 min PRN [place] Service, Gijsbertus F, MD       fiber supplement (BANATROL TF) liquid 60 mL  60 mL Oral BID Clapacs, John T, MD   60 mL at 10/23/22 2126   fluvoxaMINE (LUVOX) tablet 150 mg  150 mg Oral QHS Clapacs, John T, MD   150 mg at 10/23/22 2126   insulin aspart (novoLOG) injection 0-9 Units  0-9 Units Subcutaneous TID AC & HS Clapacs, Madie Reno, MD   2 Units at 10/24/22 7341   magnesium hydroxide (MILK OF MAGNESIA) suspension 30 mL  30 mL Oral Daily PRN Clapacs, John T, MD   30 mL at 10/22/22 1222   multivitamin with minerals tablet 1 tablet  1 tablet Oral Daily Clapacs, Madie Reno, MD   1 tablet at 10/23/22 0926   OLANZapine (ZYPREXA) tablet 5 mg  5 mg Oral QHS Clapacs, John T, MD   5 mg at 10/23/22 2126   ondansetron (ZOFRAN) injection 4 mg  4 mg Intravenous Once PRN Boston Service, Jane Canary, MD       oxybutynin (DITROPAN) tablet 5 mg  5 mg Oral Q8H PRN Clapacs, John T, MD       polyethylene glycol (MIRALAX / GLYCOLAX) packet 17 g  17 g Oral Daily Clapacs, John T, MD   17 g at 10/23/22 1544   rosuvastatin (CRESTOR) tablet 10 mg  10 mg Oral Daily Clapacs, Madie Reno, MD   10 mg at 10/24/22 0836   senna-docusate (Senokot-S) tablet 1 tablet  1 tablet Oral BID Clapacs, Madie Reno, MD   1 tablet at 10/23/22 2126   tamsulosin (FLOMAX) capsule 0.4 mg  0.4 mg Oral Daily Clapacs, John T, MD   0.4 mg at 10/24/22 1423   Vitamin D (Ergocalciferol)  (DRISDOL) 1.25 MG (50000 UNIT) capsule 50,000 Units  50,000 Units Oral Q7 days Clapacs, Madie Reno, MD  50,000 Units at 10/21/22 2542    Lab Results:  Results for orders placed or performed during the hospital encounter of 10/19/22 (from the past 48 hour(s))  Glucose, capillary     Status: Abnormal   Collection Time: 10/22/22  4:29 PM  Result Value Ref Range   Glucose-Capillary 147 (H) 70 - 99 mg/dL    Comment: Glucose reference range applies only to samples taken after fasting for at least 8 hours.  Glucose, capillary     Status: Abnormal   Collection Time: 10/22/22  7:25 PM  Result Value Ref Range   Glucose-Capillary 158 (H) 70 - 99 mg/dL    Comment: Glucose reference range applies only to samples taken after fasting for at least 8 hours.  Glucose, capillary     Status: Abnormal   Collection Time: 10/23/22  7:32 AM  Result Value Ref Range   Glucose-Capillary 180 (H) 70 - 99 mg/dL    Comment: Glucose reference range applies only to samples taken after fasting for at least 8 hours.  Glucose, capillary     Status: Abnormal   Collection Time: 10/23/22 11:52 AM  Result Value Ref Range   Glucose-Capillary 189 (H) 70 - 99 mg/dL    Comment: Glucose reference range applies only to samples taken after fasting for at least 8 hours.  Glucose, capillary     Status: Abnormal   Collection Time: 10/23/22  4:44 PM  Result Value Ref Range   Glucose-Capillary 170 (H) 70 - 99 mg/dL    Comment: Glucose reference range applies only to samples taken after fasting for at least 8 hours.  Glucose, capillary     Status: Abnormal   Collection Time: 10/23/22  8:12 PM  Result Value Ref Range   Glucose-Capillary 152 (H) 70 - 99 mg/dL    Comment: Glucose reference range applies only to samples taken after fasting for at least 8 hours.  Glucose, capillary     Status: Abnormal   Collection Time: 10/24/22  7:44 AM  Result Value Ref Range   Glucose-Capillary 158 (H) 70 - 99 mg/dL    Comment: Glucose reference  range applies only to samples taken after fasting for at least 8 hours.    Blood Alcohol level:  Lab Results  Component Value Date   ETH <5 01/04/2016   ETH <5 12/15/2015    Metabolic Disorder Labs: Lab Results  Component Value Date   HGBA1C 6.3 (H) 10/11/2022   MPG 134 10/11/2022   MPG 91 03/27/2017   Lab Results  Component Value Date   PROLACTIN 29.7 (H) 01/05/2016   Lab Results  Component Value Date   CHOL 144 10/11/2022   TRIG 148 10/11/2022   HDL 44 10/11/2022   CHOLHDL 4.6 03/27/2017   VLDL 23 03/27/2017   LDLCALC 74 10/11/2022   LDLCALC 45 04/08/2022    Physical Findings: AIMS:  , ,  ,  ,    CIWA:    COWS:     Musculoskeletal: Strength & Muscle Tone: within normal limits Gait & Station: Normal Patient leans: N/A  Psychiatric Specialty Exam:  Presentation  General Appearance:  Appropriate for Environment  Eye Contact: Good  Speech: Clear and Coherent  Speech Volume: Normal  Handedness: Right   Mood and Affect  Mood: Anxious; Depressed  Affect: Congruent   Thought Process  Thought Processes: Linear  Descriptions of Associations:Intact  Orientation:Full (Time, Place and Person)  Thought Content:Paranoid Ideation ("I will have diarrhea if  I eat or drink")  History of Schizophrenia/Schizoaffective disorder:No  Duration of Psychotic Symptoms:No data recorded Hallucinations:No data recorded Ideas of Reference:Paranoia  Suicidal Thoughts:No data recorded Homicidal Thoughts:No data recorded  Sensorium  Memory: Immediate Good  Judgment: Poor  Insight: Fair   Art therapist  Concentration: Fair  Attention Span: Good  Recall: Fair  Fund of Knowledge: Fair  Language: Fair   Psychomotor Activity  Psychomotor Activity:No data recorded  Assets  Assets: Desire for Improvement; Financial Resources/Insurance; Housing   Sleep  Sleep:No data recorded   Physical Exam: Physical Exam Vitals and  nursing note reviewed.  Constitutional:      Appearance: Normal appearance. He is normal weight.  Neurological:     General: No focal deficit present.     Mental Status: He is alert and oriented to person, place, and time.  Psychiatric:        Attention and Perception: Attention and perception normal.        Mood and Affect: Mood is depressed. Affect is flat.        Speech: Speech normal.        Behavior: Behavior normal. Behavior is cooperative.        Thought Content: Thought content normal.        Cognition and Memory: Cognition and memory normal.        Judgment: Judgment normal.    Review of Systems  Constitutional: Negative.   HENT: Negative.    Eyes: Negative.   Respiratory: Negative.    Cardiovascular: Negative.   Gastrointestinal: Negative.   Genitourinary: Negative.   Musculoskeletal: Negative.   Skin: Negative.   Neurological: Negative.   Endo/Heme/Allergies: Negative.   Psychiatric/Behavioral:  Positive for depression.    Blood pressure 121/79, pulse 66, temperature 97.7 F (36.5 C), resp. rate 16, height 5\' 9"  (1.753 m), weight 85.5 kg, SpO2 98 %. Body mass index is 27.84 kg/m.   Treatment Plan Summary: Daily contact with patient to assess and evaluate symptoms and progress in treatment, Medication management, and Plan continue current medications.  , DO 10/24/2022, 2:58 PM

## 2022-10-24 NOTE — Progress Notes (Signed)
Patient did not attend group due to being off unit.

## 2022-10-24 NOTE — Transfer of Care (Signed)
Immediate Anesthesia Transfer of Care Note  Patient: Dillon [name]  Procedure(s) Performed: ECT TX  Patient Location: PACU  Anesthesia Type:General  Level of Consciousness: drowsy  Airway & Oxygen Therapy: Patient Spontanous Breathing and Patient connected to nasal cannula oxygen  Post-op Assessment: Report given to RN and Post -op Vital signs reviewed and stable  Post vital signs: Reviewed and stable  Last Vitals:  Vitals Value Taken Time  BP 149/104 10/24/22 1321  Temp 36.5 C 10/24/22 1321  Pulse 85 10/24/22 1321  Resp 11 10/24/22 1324  SpO2 97 % 10/24/22 1321  Vitals shown include unvalidated device data.  Last Pain:  Vitals:   10/24/22 1152  TempSrc:   PainSc: 0-No pain         Complications: No notable events documented.

## 2022-10-24 NOTE — Procedures (Signed)
ECT SERVICES Physician's Interval Evaluation & Treatment Note  Patient Identification: Dillon [name] MRN:  [id number] Date of Evaluation:  10/24/2022 TX #: 2  MADRS:   MMSE:   P.E. Findings:  No change to physical exam  Psychiatric Interval Note:  Seems a little tired today.  Denies suicidal thoughts  Subjective:  Patient is a 72 y.o. male seen for evaluation for Electroconvulsive Therapy. Still anxious and obsessive but not depressed  Treatment Summary:   []   Right Unilateral             [x]  Bilateral   % Energy : 1.0 ms 50%   Impedance: 1390 ohms  Seizure Energy Index: 8439 V squared  Postictal Suppression Index: 66%  Seizure Concordance Index: 99%  Medications  Pre Shock: Propofol 40 mg ketamine 40 mg succinylcholine 80 mg labetalol 5 mg  Post Shock: None  Seizure Duration: 31 seconds EMG 69 seconds EEG   Comments: Follow-up Wednesday  Lungs:  [x]   Clear to auscultation               []  Other:   Heart:    [x]   Regular rhythm             []  irregular rhythm    [x]   Previous H&P reviewed, patient examined and there are NO CHANGES                 []   Previous H&P reviewed, patient examined and there are changes noted.   Alethia Berthold, MD 1/8/20242:29 PM

## 2022-10-24 NOTE — Progress Notes (Incomplete)
Patient is A+O x 4. He denies SI/HI/AVH. He endorses depression but agrees to contract for safety. Appetite fair with encouragement. Medication compliant. Patient was NPO for ECT. Post ECT assessment completed and vitals were WNL. Patient's only complaint was that he did not have an appetite but was willing to consume an Ensure. Patient rested until dinner and consumed 50%.  Q15 minute unit checks in place.

## 2022-10-24 NOTE — Plan of Care (Signed)
  Problem: Nutrition: Goal: Adequate nutrition will be maintained Outcome: Progressing   Problem: Coping: Goal: Level of anxiety will decrease Outcome: Progressing   Problem: Elimination: Goal: Will not experience complications related to bowel motility Outcome: Progressing Goal: Will not experience complications related to urinary retention Outcome: Progressing   

## 2022-10-24 NOTE — Progress Notes (Signed)
Patient is alert and oriented times 4. Mood and affect appropriate. Patient rates pain as 0/10. He denies SI, HI, and AVH. Patient does endorse feelings of anxiety and depression at this time. Patient is also concerned about constipation, though he reports having a bowel movement during the day.  Patient was also noted having a bowel movement during safety rounds.  Patient states he slept well last night. Evening medicines administered whole by mouth without difficulty. Patient refused snack but did have his Ensure in his room; appetite was fair. Patient remains on unit with Q15 minute checks in place.

## 2022-10-25 ENCOUNTER — Other Ambulatory Visit: Payer: Self-pay | Admitting: Psychiatry

## 2022-10-25 ENCOUNTER — Ambulatory Visit: Payer: Medicare HMO | Admitting: Nurse Practitioner

## 2022-10-25 LAB — GLUCOSE, CAPILLARY
Glucose-Capillary: 165 mg/dL — ABNORMAL HIGH (ref 70–99)
Glucose-Capillary: 208 mg/dL — ABNORMAL HIGH (ref 70–99)
Glucose-Capillary: 237 mg/dL — ABNORMAL HIGH (ref 70–99)
Glucose-Capillary: 189 mg/dL — ABNORMAL HIGH (ref 70–99)

## 2022-10-25 MED ORDER — FLUVOXAMINE MALEATE 50 MG PO TABS
100.0000 mg | ORAL_TABLET | ORAL | Status: DC
  Administered 2022-10-25 – 2022-11-02 (×15): 100 mg via ORAL
  Filled 2022-10-25 (×15): qty 2

## 2022-10-25 NOTE — Plan of Care (Signed)
D- Patient alert and oriented. Patient presents with a pleasant mood and affect. Patient denies SI, HI, AVH, and pain. Patient states that he had a bowel movement. Patient does not state any goal that he would like to work on with Probation officer today. Writer will continue to monitor and assess as needed for goal setting.   A- Scheduled medications administered to patient, per MD orders. Support and encouragement provided.  Routine safety checks conducted every 15 minutes.  Patient informed to notify staff with problems or concerns.  R- No adverse drug reactions noted. Patient contracts for safety at this time. Patient compliant with medications and treatment plan. Patient receptive, calm, and cooperative. Patient interacts well with others on the unit. Patient participated in group. Patient remains safe at this time.  Problem: Education: Goal: Knowledge of General Education information will improve Description: Including pain rating scale, medication(s)/side effects and non-pharmacologic comfort measures Outcome: Progressing   Problem: Health Behavior/Discharge Planning: Goal: Ability to manage health-related needs will improve Outcome: Progressing   Problem: Clinical Measurements: Goal: Ability to maintain clinical measurements within normal limits will improve Outcome: Progressing Goal: Will remain free from infection Outcome: Progressing Goal: Diagnostic test results will improve Outcome: Progressing Goal: Respiratory complications will improve Outcome: Progressing Goal: Cardiovascular complication will be avoided Outcome: Progressing   Problem: Coping: Goal: Level of anxiety will decrease Outcome: Progressing   Problem: Elimination: Goal: Will not experience complications related to bowel motility Outcome: Progressing Goal: Will not experience complications related to urinary retention Outcome: Progressing   Problem: Education: Goal: Utilization of techniques to improve thought  processes will improve Outcome: Progressing Goal: Knowledge of the prescribed therapeutic regimen will improve Outcome: Progressing   Problem: Activity: Goal: Interest or engagement in leisure activities will improve Outcome: Not Progressing   Problem: Coping: Goal: Coping ability will improve Outcome: Progressing Goal: Will verbalize feelings Outcome: Progressing

## 2022-10-25 NOTE — Progress Notes (Signed)
[facility] MD Progress Note  10/25/2022 10:27 AM Dillon [name]  MRN:  [id number] Subjective: Dillon [name] is seen on rounds.  He had the ECT yesterday.  He tolerated it well.  He says that he feels a little bit better.  He is not having any problems or side effects from his medication.  I talked to him about going up on his Luvox.  He is able to contract for safety in the hospital.  Sleep was fair. Principal Problem: Severe recurrent major depression without psychotic features (HCC) Diagnosis: Principal Problem:   Severe recurrent major depression without psychotic features (HCC)  Total Time spent with patient: 15 minutes  Past Psychiatric History:  ECT back in 2018. He has been on Luvox in the past. I believe he was supposed to follow-up at [facility] at that time.    Past Medical History:  Past Medical History:  Diagnosis Date   Kidney stone    Meningitis    spinal   OCD (obsessive compulsive disorder)    Shingles     Past Surgical History:  Procedure Laterality Date   CYSTOSCOPY W/ URETERAL STENT PLACEMENT Right 10/12/2022   Procedure: CYSTOSCOPY WITH RETROGRADE PYELOGRAM/URETERAL STENT PLACEMENT;  Surgeon: Riki Altes, MD;  Location: [facility] ORS;  Service: Urology;  Laterality: Right;   KIDNEY STONE SURGERY     Family History:  Family History  Problem Relation Age of Onset   Cancer Mother        bladder   Emphysema Father    Emphysema Sister    COPD Sister    Cancer Brother        prostate    Social History:  Social History   Substance and Sexual Activity  Alcohol Use No   Alcohol/week: 0.0 standard drinks of alcohol     Social History   Substance and Sexual Activity  Drug Use No    Social History   Socioeconomic History   Marital status: Widowed    Spouse name: Not on file   Number of children: Not on file   Years of education: Not on file   Highest education level: Not on file  Occupational History   Occupation: retired  Tobacco Use   Smoking status: Never    Smokeless tobacco: Never  Vaping Use   Vaping Use: Never used  Substance and Sexual Activity   Alcohol use: No    Alcohol/week: 0.0 standard drinks of alcohol   Drug use: No   Sexual activity: Not Currently  Other Topics Concern   Not on file  Social History Narrative   Not on file   Social Determinants of Health   Financial Resource Strain: Low Risk  (02/10/2022)   Overall Financial Resource Strain (CARDIA)    Difficulty of Paying Living Expenses: Not hard at all  Food Insecurity: No Food Insecurity (10/19/2022)   Hunger Vital Sign    Worried About Running Out of Food in the Last Year: Never true    Ran Out of Food in the Last Year: Never true  Transportation Needs: No Transportation Needs (10/19/2022)   PRAPARE - Administrator, Civil Service (Medical): No    Lack of Transportation (Non-Medical): No  Physical Activity: Inactive (02/10/2022)   Exercise Vital Sign    Days of Exercise per Week: 0 days    Minutes of Exercise per Session: 0 min  Stress: No Stress Concern Present (02/10/2022)   [name] of Occupational Health - Occupational Stress Questionnaire    Feeling  of Stress : Not at all  Social Connections: Moderately Integrated (02/10/2022)   Social Connection and Isolation Panel [NHANES]    Frequency of Communication with Friends and Family: Twice a week    Frequency of Social Gatherings with Friends and Family: Twice a week    Attends Religious Services: 1 to 4 times per year    Active Member of [employer] or Organizations: No    Attends Music therapist: More than 4 times per year    Marital Status: Widowed   Additional Social History:                         Sleep: Fair  Appetite:  Fair  Current Medications: Current Facility-Administered Medications  Medication Dose Route Frequency Provider Last Rate Last Admin   acetaminophen (TYLENOL) tablet 650 mg  650 mg Oral Q6H PRN Clapacs, John T, MD       alum & mag hydroxide-simeth  (MAALOX/MYLANTA) 200-200-20 MG/5ML suspension 30 mL  30 mL Oral Q4H PRN Clapacs, John T, MD       cyanocobalamin (VITAMIN B12) tablet 1,000 mcg  1,000 mcg Oral Daily Clapacs, John T, MD   1,000 mcg at 10/25/22 7371   docusate sodium (COLACE) capsule 100 mg  100 mg Oral BID Clapacs, John T, MD   100 mg at 10/25/22 0920   feeding supplement (ENSURE ENLIVE / ENSURE PLUS) liquid 237 mL  237 mL Oral TID BM Clapacs, John T, MD   237 mL at 10/25/22 0922   fentaNYL (SUBLIMAZE) injection 25 mcg  25 mcg Intravenous Q5 min PRN [place] Service, Gijsbertus F, MD       fiber supplement (BANATROL TF) liquid 60 mL  60 mL Oral BID Clapacs, John T, MD   60 mL at 10/25/22 0920   fluvoxaMINE (LUVOX) tablet 100 mg  100 mg Oral [id number] Johnella Crumm Edward, DO       insulin aspart (novoLOG) injection 0-9 Units  0-9 Units Subcutaneous TID AC & HS Clapacs, Madie Reno, MD   2 Units at 10/25/22 0813   magnesium hydroxide (MILK OF MAGNESIA) suspension 30 mL  30 mL Oral Daily PRN Clapacs, John T, MD   30 mL at 10/22/22 1222   multivitamin with minerals tablet 1 tablet  1 tablet Oral Daily Clapacs, Madie Reno, MD   1 tablet at 10/25/22 0921   OLANZapine (ZYPREXA) tablet 5 mg  5 mg Oral QHS Clapacs, John T, MD   5 mg at 10/24/22 2104   ondansetron (ZOFRAN) injection 4 mg  4 mg Intravenous Once PRN [place] Service, Jane Canary, MD       oxybutynin (DITROPAN) tablet 5 mg  5 mg Oral Q8H PRN Clapacs, John T, MD       polyethylene glycol (MIRALAX / GLYCOLAX) packet 17 g  17 g Oral Daily Clapacs, John T, MD   17 g at 10/25/22 0920   rosuvastatin (CRESTOR) tablet 10 mg  10 mg Oral Daily Clapacs, Madie Reno, MD   10 mg at 10/25/22 0626   senna-docusate (Senokot-S) tablet 1 tablet  1 tablet Oral BID Clapacs, Madie Reno, MD   1 tablet at 10/25/22 9485   tamsulosin (FLOMAX) capsule 0.4 mg  0.4 mg Oral Daily Clapacs, Madie Reno, MD   0.4 mg at 10/25/22 4627   Vitamin D (Ergocalciferol) (DRISDOL) 1.25 MG (50000 UNIT) capsule 50,000 Units  50,000 Units Oral Q7  days Clapacs, Madie Reno, MD   50,000 Units  at 10/21/22 7124    Lab Results:  Results for orders placed or performed during the hospital encounter of 10/19/22 (from the past 48 hour(s))  Glucose, capillary     Status: Abnormal   Collection Time: 10/23/22 11:52 AM  Result Value Ref Range   Glucose-Capillary 189 (H) 70 - 99 mg/dL    Comment: Glucose reference range applies only to samples taken after fasting for at least 8 hours.  Glucose, capillary     Status: Abnormal   Collection Time: 10/23/22  4:44 PM  Result Value Ref Range   Glucose-Capillary 170 (H) 70 - 99 mg/dL    Comment: Glucose reference range applies only to samples taken after fasting for at least 8 hours.  Glucose, capillary     Status: Abnormal   Collection Time: 10/23/22  8:12 PM  Result Value Ref Range   Glucose-Capillary 152 (H) 70 - 99 mg/dL    Comment: Glucose reference range applies only to samples taken after fasting for at least 8 hours.  Glucose, capillary     Status: Abnormal   Collection Time: 10/24/22  7:44 AM  Result Value Ref Range   Glucose-Capillary 158 (H) 70 - 99 mg/dL    Comment: Glucose reference range applies only to samples taken after fasting for at least 8 hours.  Glucose, capillary     Status: Abnormal   Collection Time: 10/24/22  4:38 PM  Result Value Ref Range   Glucose-Capillary 194 (H) 70 - 99 mg/dL    Comment: Glucose reference range applies only to samples taken after fasting for at least 8 hours.  Glucose, capillary     Status: Abnormal   Collection Time: 10/24/22  9:15 PM  Result Value Ref Range   Glucose-Capillary 146 (H) 70 - 99 mg/dL    Comment: Glucose reference range applies only to samples taken after fasting for at least 8 hours.  Glucose, capillary     Status: Abnormal   Collection Time: 10/25/22  7:46 AM  Result Value Ref Range   Glucose-Capillary 165 (H) 70 - 99 mg/dL    Comment: Glucose reference range applies only to samples taken after fasting for at least 8 hours.     Blood Alcohol level:  Lab Results  Component Value Date   ETH <5 01/04/2016   ETH <5 12/15/2015    Metabolic Disorder Labs: Lab Results  Component Value Date   HGBA1C 6.3 (H) 10/11/2022   MPG 134 10/11/2022   MPG 91 03/27/2017   Lab Results  Component Value Date   PROLACTIN 29.7 (H) 01/05/2016   Lab Results  Component Value Date   CHOL 144 10/11/2022   TRIG 148 10/11/2022   HDL 44 10/11/2022   CHOLHDL 4.6 03/27/2017   VLDL 23 03/27/2017   LDLCALC 74 10/11/2022   LDLCALC 45 04/08/2022    Physical Findings: AIMS:  , ,  ,  ,    CIWA:    COWS:     Musculoskeletal: Strength & Muscle Tone: within normal limits Gait & Station: normal Patient leans: N/A  Psychiatric Specialty Exam:  Presentation  General Appearance:  Appropriate for Environment  Eye Contact: Good  Speech: Clear and Coherent  Speech Volume: Normal  Handedness: Right   Mood and Affect  Mood: Anxious; Depressed  Affect: Congruent   Thought Process  Thought Processes: Linear  Descriptions of Associations:Intact  Orientation:Full (Time, Place and Person)  Thought Content:Paranoid Ideation ("I will have diarrhea if  I eat or drink")  History of  Schizophrenia/Schizoaffective disorder:No  Duration of Psychotic Symptoms:No data recorded Hallucinations:No data recorded Ideas of Reference:Paranoia  Suicidal Thoughts:No data recorded Homicidal Thoughts:No data recorded  Sensorium  Memory: Immediate Good  Judgment: Poor  Insight: Fair   Art therapist  Concentration: Fair  Attention Span: Good  Recall: Fair  Fund of Knowledge: Fair  Language: Fair   Psychomotor Activity  Psychomotor Activity:No data recorded  Assets  Assets: Desire for Improvement; Financial Resources/Insurance; Housing   Sleep  Sleep:No data recorded   Physical Exam: Physical Exam Vitals and nursing note reviewed.  Constitutional:      Appearance: Normal  appearance. He is normal weight.  Neurological:     General: No focal deficit present.     Mental Status: He is alert and oriented to person, place, and time.  Psychiatric:        Attention and Perception: Attention and perception normal.        Mood and Affect: Mood is depressed. Affect is flat.        Speech: Speech normal.        Behavior: Behavior normal. Behavior is cooperative.        Thought Content: Thought content normal.        Cognition and Memory: Cognition and memory normal.        Judgment: Judgment normal.    Review of Systems  Constitutional: Negative.   HENT: Negative.    Eyes: Negative.   Respiratory: Negative.    Cardiovascular: Negative.   Gastrointestinal: Negative.   Genitourinary: Negative.   Musculoskeletal: Negative.   Skin: Negative.   Neurological: Negative.   Endo/Heme/Allergies: Negative.   Psychiatric/Behavioral:  Positive for depression.    Blood pressure 126/81, pulse 90, temperature 98.2 F (36.8 C), temperature source Oral, resp. rate 16, height 5\' 9"  (1.753 m), weight 85.5 kg, SpO2 97 %. Body mass index is 27.84 kg/m.   Treatment Plan Summary: Daily contact with patient to assess and evaluate symptoms and progress in treatment, Medication management, and Plan increase Luvox to 100 mg twice a day.  Continue ECT.  , DO 10/25/2022, 10:27 AM

## 2022-10-25 NOTE — Plan of Care (Signed)
  Problem: Education: Goal: Knowledge of General Education information will improve Description: Including pain rating scale, medication(s)/side effects and non-pharmacologic comfort measures Outcome: Progressing   Problem: Health Behavior/Discharge Planning: Goal: Ability to manage health-related needs will improve Outcome: Progressing   Problem: Clinical Measurements: Goal: Ability to maintain clinical measurements within normal limits will improve Outcome: Progressing Goal: Will remain free from infection Outcome: Progressing Goal: Diagnostic test results will improve Outcome: Progressing Goal: Respiratory complications will improve Outcome: Progressing Goal: Cardiovascular complication will be avoided Outcome: Progressing   Problem: Activity: Goal: Risk for activity intolerance will decrease Outcome: Progressing   Problem: Nutrition: Goal: Adequate nutrition will be maintained Outcome: Progressing   Problem: Coping: Goal: Level of anxiety will decrease Outcome: Progressing   Problem: Elimination: Goal: Will not experience complications related to bowel motility Outcome: Progressing Goal: Will not experience complications related to urinary retention Outcome: Progressing   Problem: Pain Managment: Goal: General experience of comfort will improve Outcome: Progressing   Problem: Safety: Goal: Ability to remain free from injury will improve Outcome: Progressing   Problem: Skin Integrity: Goal: Risk for impaired skin integrity will decrease Outcome: Progressing   Problem: Fluid Volume: Goal: Hemodynamic stability will improve Outcome: Progressing   Problem: Clinical Measurements: Goal: Diagnostic test results will improve Outcome: Progressing Goal: Signs and symptoms of infection will decrease Outcome: Progressing   Problem: Respiratory: Goal: Ability to maintain adequate ventilation will improve Outcome: Progressing   Problem: Education: Goal:  Utilization of techniques to improve thought processes will improve Outcome: Progressing Goal: Knowledge of the prescribed therapeutic regimen will improve Outcome: Progressing   Problem: Activity: Goal: Interest or engagement in leisure activities will improve Outcome: Progressing Goal: Imbalance in normal sleep/wake cycle will improve Outcome: Progressing   Problem: Coping: Goal: Coping ability will improve Outcome: Progressing Goal: Will verbalize feelings Outcome: Progressing   Problem: Health Behavior/Discharge Planning: Goal: Ability to make decisions will improve Outcome: Progressing Goal: Compliance with therapeutic regimen will improve Outcome: Progressing   Problem: Role Relationship: Goal: Will demonstrate positive changes in social behaviors and relationships Outcome: Progressing   Problem: Safety: Goal: Ability to disclose and discuss suicidal ideas will improve Outcome: Progressing Goal: Ability to identify and utilize support systems that promote safety will improve Outcome: Progressing   Problem: Self-Concept: Goal: Will verbalize positive feelings about self Outcome: Progressing Goal: Level of anxiety will decrease Outcome: Progressing

## 2022-10-25 NOTE — Group Note (Signed)
[place] LCSW Group Therapy Note   Group Date: 10/25/2022 Start Time: 1330 End Time: 1355   Type of Therapy/Topic:  Group Therapy:  Emotion Regulation  Participation Level:  Minimal     Description of Group:    The purpose of this group is to assist patients in learning to regulate negative emotions and experience positive emotions. Patients will be guided to discuss ways in which they have been vulnerable to their negative emotions. These vulnerabilities will be juxtaposed with experiences of positive emotions or situations, and patients challenged to use positive emotions to combat negative ones. Special emphasis will be placed on coping with negative emotions in conflict situations, and patients will process healthy conflict resolution skills.  Therapeutic Goals: Patient will identify two positive emotions or experiences to reflect on in order to balance out negative emotions:  Patient will label two or more emotions that they find the most difficult to experience:  Patient will be able to demonstrate positive conflict resolution skills through discussion or role plays:   Summary of Patient Progress:   Patient was present for the entirety of the group session. Patient was an active listener and participated in the topic of discussion, provided helpful advice to others, and added nuance to topic of conversation.  He stated during the icebreaker that his favorite rainy day activity was "watching something good on television." Patient has low mood. Group ended early due to unit disruption from [place] and pt's needing to be moved to a safer area.     Therapeutic Modalities:   Cognitive Behavioral Therapy Feelings Identification Dialectical Behavioral Therapy   Alejandra Barna A [place], LCSWA

## 2022-10-25 NOTE — [facility] Group Notes (Signed)
[facility] Group Notes:  (Nursing/MHT/Case Management/Adjunct)  Date:  10/25/2022  Time:  2:15  Type of Therapy:  Music Therapy  Participation Level:  Active  Participation Quality:  Appropriate  Affect:  Appropriate  Cognitive:  Alert and Oriented  Insight:  Good  Engagement in Group:  Engaged  Modes of Intervention:  Activity  Summary of Progress/Problems:  Dillon [name] l Dillon [name] 10/25/2022, 5:52 PM

## 2022-10-26 ENCOUNTER — Inpatient Hospital Stay: Payer: Medicare HMO | Admitting: Certified Registered"

## 2022-10-26 ENCOUNTER — Encounter: Payer: Self-pay | Admitting: Psychiatry

## 2022-10-26 ENCOUNTER — Ambulatory Visit: Payer: Medicare HMO

## 2022-10-26 DIAGNOSIS — F332 Major depressive disorder, recurrent severe without psychotic features: Secondary | ICD-10-CM | POA: Diagnosis not present

## 2022-10-26 DIAGNOSIS — E119 Type 2 diabetes mellitus without complications: Secondary | ICD-10-CM | POA: Diagnosis not present

## 2022-10-26 DIAGNOSIS — F418 Other specified anxiety disorders: Secondary | ICD-10-CM | POA: Diagnosis not present

## 2022-10-26 LAB — GLUCOSE, CAPILLARY
Glucose-Capillary: 204 mg/dL — ABNORMAL HIGH (ref 70–99)
Glucose-Capillary: 172 mg/dL — ABNORMAL HIGH (ref 70–99)
Glucose-Capillary: 238 mg/dL — ABNORMAL HIGH (ref 70–99)
Glucose-Capillary: 138 mg/dL — ABNORMAL HIGH (ref 70–99)

## 2022-10-26 MED ORDER — OLANZAPINE 10 MG PO TABS
10.0000 mg | ORAL_TABLET | Freq: Every day | ORAL | Status: DC
  Administered 2022-10-26 – 2022-11-01 (×7): 10 mg via ORAL
  Filled 2022-10-26 (×4): qty 2
  Filled 2022-10-26: qty 1
  Filled 2022-10-26 (×4): qty 2

## 2022-10-26 MED ORDER — KETAMINE HCL 50 MG/5ML IJ SOSY
PREFILLED_SYRINGE | INTRAMUSCULAR | Status: AC
  Filled 2022-10-26: qty 5

## 2022-10-26 MED ORDER — PROPOFOL 10 MG/ML IV BOLUS
INTRAVENOUS | Status: DC | PRN
  Administered 2022-10-26: 40 mg via INTRAVENOUS

## 2022-10-26 MED ORDER — SUCCINYLCHOLINE CHLORIDE 200 MG/10ML IV SOSY
PREFILLED_SYRINGE | INTRAVENOUS | Status: AC
  Filled 2022-10-26: qty 10

## 2022-10-26 MED ORDER — KETOROLAC TROMETHAMINE 30 MG/ML IJ SOLN
INTRAMUSCULAR | Status: AC
  Filled 2022-10-26: qty 1

## 2022-10-26 MED ORDER — SUCCINYLCHOLINE CHLORIDE 200 MG/10ML IV SOSY
PREFILLED_SYRINGE | INTRAVENOUS | Status: DC | PRN
  Administered 2022-10-26: 80 mg via INTRAVENOUS

## 2022-10-26 MED ORDER — PROPOFOL 10 MG/ML IV BOLUS
INTRAVENOUS | Status: AC
  Filled 2022-10-26: qty 20

## 2022-10-26 MED ORDER — LABETALOL HCL 5 MG/ML IV SOLN
INTRAVENOUS | Status: DC | PRN
  Administered 2022-10-26: 10 mg via INTRAVENOUS

## 2022-10-26 MED ORDER — SODIUM CHLORIDE 0.9 % IV SOLN
500.0000 mL | Freq: Once | INTRAVENOUS | Status: AC
  Administered 2022-10-26: 500 mL via INTRAVENOUS

## 2022-10-26 MED ORDER — KETAMINE HCL 10 MG/ML IJ SOLN
INTRAMUSCULAR | Status: DC | PRN
  Administered 2022-10-26: 40 mg via INTRAVENOUS

## 2022-10-26 MED ORDER — SODIUM CHLORIDE 0.9 % IV SOLN: INTRAVENOUS | Status: DC | PRN

## 2022-10-26 MED ORDER — METHOHEXITAL SODIUM 0.5 G IJ SOLR
INTRAMUSCULAR | Status: AC
  Filled 2022-10-26: qty 500

## 2022-10-26 NOTE — Progress Notes (Signed)
Patient picked up from unit for ECT at 10:09 am.

## 2022-10-26 NOTE — Plan of Care (Signed)
D- Patient alert and oriented x 4. Patient presents with a pleasant mood and affect. Patient denies SI, HI, AVH, and pain.   A- Scheduled medications administered to patient, per MD orders. Support and encouragement provided.  Routine safety checks conducted every 15 minutes.  Patient informed to notify staff with problems or concerns.  R- No adverse drug reactions noted. Patient contracts for safety at this time. Patient compliant with medications and treatment plan. Patient receptive, calm, and cooperative. Patient interacts well with others on the unit.  Patient remains safe at this time.  Problem: Education: Goal: Knowledge of General Education information will improve Description: Including pain rating scale, medication(s)/side effects and non-pharmacologic comfort measures Outcome: Progressing   Problem: Health Behavior/Discharge Planning: Goal: Ability to manage health-related needs will improve Outcome: Progressing   Problem: Clinical Measurements: Goal: Ability to maintain clinical measurements within normal limits will improve Outcome: Progressing Goal: Will remain free from infection Outcome: Progressing Goal: Diagnostic test results will improve Outcome: Progressing Goal: Respiratory complications will improve Outcome: Progressing Goal: Cardiovascular complication will be avoided Outcome: Progressing   Problem: Activity: Goal: Risk for activity intolerance will decrease Outcome: Progressing   Problem: Nutrition: Goal: Adequate nutrition will be maintained Outcome: Progressing   Problem: Coping: Goal: Level of anxiety will decrease Outcome: Progressing   Problem: Elimination: Goal: Will not experience complications related to urinary retention Outcome: Progressing   Problem: Pain Managment: Goal: General experience of comfort will improve Outcome: Progressing   Problem: Safety: Goal: Ability to remain free from injury will improve Outcome: Progressing    Problem: Skin Integrity: Goal: Risk for impaired skin integrity will decrease Outcome: Progressing   Problem: Education: Goal: Utilization of techniques to improve thought processes will improve Outcome: Progressing Goal: Knowledge of the prescribed therapeutic regimen will improve Outcome: Progressing   Problem: Coping: Goal: Coping ability will improve Outcome: Progressing Goal: Will verbalize feelings Outcome: Progressing

## 2022-10-26 NOTE — Anesthesia Procedure Notes (Signed)
Procedure Name: General with mask airway Date/Time: 10/26/2022 12:55 PM  Performed by: Cammie Sickle, CRNAPre-anesthesia Checklist: Patient identified, Emergency Drugs available, Suction available and Patient being monitored Patient Re-evaluated:Patient Re-evaluated prior to induction Oxygen Delivery Method: Circle system utilized

## 2022-10-26 NOTE — [facility] Group Notes (Signed)
Patient attended community meeting, as well as processing group.  Dillon [name] reported today he was ''feeling fine just taking his time to get better. '' Two poems were read, and patients were encouraged to use reflection of previous experiences to help identify their strengths and recognize negative thought patterns. Pt attended and was appropriate.

## 2022-10-26 NOTE — Transfer of Care (Signed)
Immediate Anesthesia Transfer of Care Note  Patient: Dillon [name]  Procedure(s) Performed: ECT TX  Patient Location: PACU  Anesthesia Type:General  Level of Consciousness: drowsy  Airway & Oxygen Therapy: Patient Spontanous Breathing and Patient connected to face mask oxygen  Post-op Assessment: Report given to RN and Post -op Vital signs reviewed and stable  Post vital signs: Reviewed and stable  Last Vitals:  Vitals Value Taken Time  BP 189/96 1309  Temp 35.9 1309  Pulse 103 1309  Resp 18 1309  SpO2 96 1309    Last Pain:  Vitals:   10/26/22 1048  TempSrc:   PainSc: 0-No pain         Complications: No notable events documented.

## 2022-10-26 NOTE — Progress Notes (Signed)
Patient back on the [facility] unit at 1:47 pm. Patients changed into scrubs. Writer performed assessment. All vitals normal. Patient talking with Probation officer and in a pleasant mood. Lunch ordered. Patient denies any pain or discomfort. This Probation officer will continue to monitor and assess patient as needed.

## 2022-10-26 NOTE — [facility] Group Notes (Signed)
Pt did not attend light exercise activity.

## 2022-10-26 NOTE — Plan of Care (Incomplete)
Pt calm, cooperative, med compliant, reports increased appetite, more social with staff. Visible in milieu for snack. 24 hour sleep

## 2022-10-26 NOTE — Progress Notes (Signed)
[facility] MD Progress Note  10/26/2022 1:22 PM Dillon [name]  MRN:  [id number] Subjective: Dillon [name] is seen on rounds.  He he had difficulty sleeping last night.  He has been pleasant and cooperative and mostly isolative to his room.  He is still depressed but able to contract for safety in the hospital.  We increased his Luvox yesterday but he did not get this morning's dose because of ECT.  I will go up on his Zyprexa tonight. Principal Problem: Severe recurrent major depression without psychotic features ([place]) Diagnosis: Principal Problem:   Severe recurrent major depression without psychotic features ([place])  Total Time spent with patient: 15 minutes  Past Psychiatric History:   ECT back in 2018. He has been on Luvox in the past. I believe he was supposed to follow-up at [facility] at that time.   Past Medical History:  Past Medical History:  Diagnosis Date   Kidney stone    Meningitis    spinal   OCD (obsessive compulsive disorder)    Shingles     Past Surgical History:  Procedure Laterality Date   CYSTOSCOPY W/ URETERAL STENT PLACEMENT Right 10/12/2022   Procedure: CYSTOSCOPY WITH RETROGRADE PYELOGRAM/URETERAL STENT PLACEMENT;  Surgeon: Abbie Sons, MD;  Location: [facility] ORS;  Service: Urology;  Laterality: Right;   KIDNEY STONE SURGERY     Family History:  Family History  Problem Relation Age of Onset   Cancer Mother        bladder   Emphysema Father    Emphysema Sister    COPD Sister    Cancer Brother        prostate    Social History:  Social History   Substance and Sexual Activity  Alcohol Use No   Alcohol/week: 0.0 standard drinks of alcohol     Social History   Substance and Sexual Activity  Drug Use No    Social History   Socioeconomic History   Marital status: Widowed    Spouse name: Not on file   Number of children: Not on file   Years of education: Not on file   Highest education level: Not on file  Occupational History   Occupation: retired   Tobacco Use   Smoking status: Never   Smokeless tobacco: Never  Vaping Use   Vaping Use: Never used  Substance and Sexual Activity   Alcohol use: No    Alcohol/week: 0.0 standard drinks of alcohol   Drug use: No   Sexual activity: Not Currently  Other Topics Concern   Not on file  Social History Narrative   Not on file   Social Determinants of Health   Financial Resource Strain: Low Risk  (02/10/2022)   Overall Financial Resource Strain (CARDIA)    Difficulty of Paying Living Expenses: Not hard at all  Food Insecurity: No Food Insecurity (10/19/2022)   Hunger Vital Sign    Worried About Running Out of Food in the Last Year: Never true    [place] in the Last Year: Never true  Transportation Needs: No Transportation Needs (10/19/2022)   PRAPARE - Hydrologist (Medical): No    Lack of Transportation (Non-Medical): No  Physical Activity: Inactive (02/10/2022)   Exercise Vital Sign    Days of Exercise per Week: 0 days    Minutes of Exercise per Session: 0 min  Stress: No Stress Concern Present (02/10/2022)   [place]  Feeling of Stress : Not at all  Social Connections: Moderately Integrated (02/10/2022)   Social Connection and Isolation Panel [NHANES]    Frequency of Communication with Friends and Family: Twice a week    Frequency of Social Gatherings with Friends and Family: Twice a week    Attends Religious Services: 1 to 4 times per year    Active Member of [employer] or Organizations: No    Attends Music therapist: More than 4 times per year    Marital Status: Widowed   Additional Social History:                         Sleep: Poor  Appetite:  Fair  Current Medications: Current Facility-Administered Medications  Medication Dose Route Frequency Provider Last Rate Last Admin   acetaminophen (TYLENOL) tablet 650 mg  650 mg Oral Q6H PRN Clapacs, John  T, MD       alum & mag hydroxide-simeth (MAALOX/MYLANTA) 200-200-20 MG/5ML suspension 30 mL  30 mL Oral Q4H PRN Clapacs, John T, MD       cyanocobalamin (VITAMIN B12) tablet 1,000 mcg  1,000 mcg Oral Daily Clapacs, John T, MD   1,000 mcg at 10/25/22 5093   docusate sodium (COLACE) capsule 100 mg  100 mg Oral BID Clapacs, John T, MD   100 mg at 10/25/22 2121   feeding supplement (ENSURE ENLIVE / ENSURE PLUS) liquid 237 mL  237 mL Oral TID BM Clapacs, John T, MD   237 mL at 10/25/22 0922   fentaNYL (SUBLIMAZE) injection 25 mcg  25 mcg Intravenous Q5 min PRN [place] Service, Gijsbertus F, MD       fiber supplement (BANATROL TF) liquid 60 mL  60 mL Oral BID Clapacs, John T, MD   60 mL at 10/25/22 2124   fluvoxaMINE (LUVOX) tablet 100 mg  100 mg Oral [id number] Parks Ranger, DO   100 mg at 10/25/22 1621   insulin aspart (novoLOG) injection 0-9 Units  0-9 Units Subcutaneous TID AC & HS Clapacs, Madie Reno, MD   2 Units at 10/25/22 2122   magnesium hydroxide (MILK OF MAGNESIA) suspension 30 mL  30 mL Oral Daily PRN Clapacs, John T, MD   30 mL at 10/22/22 1222   multivitamin with minerals tablet 1 tablet  1 tablet Oral Daily Clapacs, Madie Reno, MD   1 tablet at 10/25/22 0921   OLANZapine (ZYPREXA) tablet 5 mg  5 mg Oral QHS Clapacs, John T, MD   5 mg at 10/25/22 2121   ondansetron (ZOFRAN) injection 4 mg  4 mg Intravenous Once PRN [place] Service, Jane Canary, MD       oxybutynin (DITROPAN) tablet 5 mg  5 mg Oral Q8H PRN Clapacs, John T, MD       polyethylene glycol (MIRALAX / GLYCOLAX) packet 17 g  17 g Oral Daily Clapacs, John T, MD   17 g at 10/25/22 0920   rosuvastatin (CRESTOR) tablet 10 mg  10 mg Oral Daily Clapacs, Madie Reno, MD   10 mg at 10/25/22 2671   senna-docusate (Senokot-S) tablet 1 tablet  1 tablet Oral BID Clapacs, Madie Reno, MD   1 tablet at 10/25/22 2121   tamsulosin (FLOMAX) capsule 0.4 mg  0.4 mg Oral Daily Clapacs, Madie Reno, MD   0.4 mg at 10/25/22 2458   Vitamin D (Ergocalciferol) (DRISDOL)  1.25 MG (50000 UNIT) capsule 50,000 Units  50,000 Units Oral Q7 days Clapacs, Madie Reno, MD  50,000 Units at 10/21/22 5462    Lab Results:  Results for orders placed or performed during the hospital encounter of 10/19/22 (from the past 48 hour(s))  Glucose, capillary     Status: Abnormal   Collection Time: 10/24/22  4:38 PM  Result Value Ref Range   Glucose-Capillary 194 (H) 70 - 99 mg/dL    Comment: Glucose reference range applies only to samples taken after fasting for at least 8 hours.  Glucose, capillary     Status: Abnormal   Collection Time: 10/24/22  9:15 PM  Result Value Ref Range   Glucose-Capillary 146 (H) 70 - 99 mg/dL    Comment: Glucose reference range applies only to samples taken after fasting for at least 8 hours.  Glucose, capillary     Status: Abnormal   Collection Time: 10/25/22  7:46 AM  Result Value Ref Range   Glucose-Capillary 165 (H) 70 - 99 mg/dL    Comment: Glucose reference range applies only to samples taken after fasting for at least 8 hours.  Glucose, capillary     Status: Abnormal   Collection Time: 10/25/22 11:47 AM  Result Value Ref Range   Glucose-Capillary 208 (H) 70 - 99 mg/dL    Comment: Glucose reference range applies only to samples taken after fasting for at least 8 hours.  Glucose, capillary     Status: Abnormal   Collection Time: 10/25/22  4:12 PM  Result Value Ref Range   Glucose-Capillary 237 (H) 70 - 99 mg/dL    Comment: Glucose reference range applies only to samples taken after fasting for at least 8 hours.  Glucose, capillary     Status: Abnormal   Collection Time: 10/25/22  8:16 PM  Result Value Ref Range   Glucose-Capillary 189 (H) 70 - 99 mg/dL    Comment: Glucose reference range applies only to samples taken after fasting for at least 8 hours.  Glucose, capillary     Status: Abnormal   Collection Time: 10/26/22  8:51 AM  Result Value Ref Range   Glucose-Capillary 204 (H) 70 - 99 mg/dL    Comment: Glucose reference range applies  only to samples taken after fasting for at least 8 hours.    Blood Alcohol level:  Lab Results  Component Value Date   ETH <5 01/04/2016   ETH <5 12/15/2015    Metabolic Disorder Labs: Lab Results  Component Value Date   HGBA1C 6.3 (H) 10/11/2022   MPG 134 10/11/2022   MPG 91 03/27/2017   Lab Results  Component Value Date   PROLACTIN 29.7 (H) 01/05/2016   Lab Results  Component Value Date   CHOL 144 10/11/2022   TRIG 148 10/11/2022   HDL 44 10/11/2022   CHOLHDL 4.6 03/27/2017   VLDL 23 03/27/2017   LDLCALC 74 10/11/2022   LDLCALC 45 04/08/2022    Physical Findings: AIMS:  , ,  ,  ,    CIWA:    COWS:     Musculoskeletal: Strength & Muscle Tone: within normal limits Gait & Station: normal Patient leans: N/A  Psychiatric Specialty Exam:  Presentation  General Appearance:  Appropriate for Environment  Eye Contact: Good  Speech: Clear and Coherent  Speech Volume: Normal  Handedness: Right   Mood and Affect  Mood: Anxious; Depressed  Affect: Congruent   Thought Process  Thought Processes: Linear  Descriptions of Associations:Intact  Orientation:Full (Time, Place and Person)  Thought Content:Paranoid Ideation ("I will have diarrhea if  I eat or drink")  History of Schizophrenia/Schizoaffective disorder:No  Duration of Psychotic Symptoms:No data recorded Hallucinations:No data recorded Ideas of Reference:Paranoia  Suicidal Thoughts:No data recorded Homicidal Thoughts:No data recorded  Sensorium  Memory: Immediate Good  Judgment: Poor  Insight: Fair   Art therapist  Concentration: Fair  Attention Span: Good  Recall: Fair  Fund of Knowledge: Fair  Language: Fair   Psychomotor Activity  Psychomotor Activity:No data recorded  Assets  Assets: Desire for Improvement; Financial Resources/Insurance; Housing   Sleep  Sleep:No data recorded   Physical Exam: Physical Exam Vitals and nursing note  reviewed.  Constitutional:      Appearance: Normal appearance. He is normal weight.  Neurological:     General: No focal deficit present.     Mental Status: He is alert and oriented to person, place, and time.  Psychiatric:        Attention and Perception: Attention and perception normal.        Mood and Affect: Mood is depressed. Affect is flat.        Speech: Speech normal.        Behavior: Behavior normal. Behavior is cooperative.        Thought Content: Thought content normal.        Cognition and Memory: Cognition and memory normal.        Judgment: Judgment normal.    Review of Systems  Constitutional: Negative.   HENT: Negative.    Eyes: Negative.   Respiratory: Negative.    Cardiovascular: Negative.   Gastrointestinal: Negative.   Genitourinary: Negative.   Musculoskeletal: Negative.   Skin: Negative.   Neurological: Negative.   Endo/Heme/Allergies: Negative.   Psychiatric/Behavioral:  Positive for depression.    Blood pressure (!) (P) 132/94, pulse 88, temperature (P) 98.1 F (36.7 C), resp. rate (!) (P) 24, height 5\' 9"  (1.753 m), weight 85.5 kg, SpO2 92 %. Body mass index is 27.84 kg/m.   Treatment Plan Summary: Daily contact with patient to assess and evaluate symptoms and progress in treatment, Medication management, and Plan increase Zyprexa to 10 mg at bedtime.  Arvis Miguez , DO 10/26/2022, 1:22 PM

## 2022-10-26 NOTE — Group Note (Signed)
[facility] LCSW Group Therapy Note   Group Date: 10/26/2022 Start Time: 1320 End Time: 1400   Type of Therapy/Topic:  Group Therapy:  Balance in Life  Participation Level:  Did Not Attend   Description of Group:    This group will address the concept of balance and how it feels and looks when one is unbalanced. Patients will be encouraged to process areas in their lives that are out of balance, and identify reasons for remaining unbalanced. Facilitators will guide patients utilizing problem- solving interventions to address and correct the stressor making their life unbalanced. Understanding and applying boundaries will be explored and addressed for obtaining  and maintaining a balanced life. Patients will be encouraged to explore ways to assertively make their unbalanced needs known to significant others in their lives, using other group members and facilitator for support and feedback.  Therapeutic Goals: Patient will identify two or more emotions or situations they have that consume much of in their lives. Patient will identify signs/triggers that life has become out of balance:  Patient will identify two ways to set boundaries in order to achieve balance in their lives:  Patient will demonstrate ability to communicate their needs through discussion and/or role plays  Summary of Patient Progress:  X    Therapeutic Modalities:   Cognitive Behavioral Therapy Solution-Focused Therapy Assertiveness Training   Kenney Going A Ondre Salvetti, LCSWA 

## 2022-10-26 NOTE — Anesthesia Preprocedure Evaluation (Signed)
Anesthesia Evaluation  Patient identified by MRN, date of birth, ID band Patient awake    Reviewed: Allergy & Precautions, NPO status , Patient's Chart, lab work & pertinent test results  Airway Mallampati: III  TM Distance: >3 FB     Dental  (+) Chipped   Pulmonary neg pulmonary ROS    + decreased breath sounds      Cardiovascular Exercise Tolerance: Good + angina   Rate:Tachycardia     Neuro/Psych   Anxiety Depression    negative neurological ROS     GI/Hepatic negative GI ROS, Neg liver ROS,,,  Endo/Other  diabetes    Renal/GU Renal disease  negative genitourinary   Musculoskeletal   Abdominal   Peds negative pediatric ROS (+)  Hematology   Anesthesia Other Findings   Reproductive/Obstetrics                              Anesthesia Physical Anesthesia Plan  ASA: 3  Anesthesia Plan: General   Post-op Pain Management:    Induction: Intravenous  PONV Risk Score and Plan:   Airway Management Planned: Natural Airway  Additional Equipment:   Intra-op Plan:   Post-operative Plan:   Informed Consent: I have reviewed the patients History and Physical, chart, labs and discussed the procedure including the risks, benefits and alternatives for the proposed anesthesia with the patient or authorized representative who has indicated his/her understanding and acceptance.       Plan Discussed with: CRNA and Surgeon  Anesthesia Plan Comments:         Anesthesia Quick Evaluation

## 2022-10-26 NOTE — H&P (Signed)
Dillon [name] is an 72 y.o. male.   Chief Complaint: Patient reports mood is feeling better.  Not feeling depressed.  Eating better.  Anxiety improved HPI: History of recurrent depression and OCD  Past Medical History:  Diagnosis Date   Kidney stone    Meningitis    spinal   OCD (obsessive compulsive disorder)    Shingles     Past Surgical History:  Procedure Laterality Date   CYSTOSCOPY W/ URETERAL STENT PLACEMENT Right 10/12/2022   Procedure: CYSTOSCOPY WITH RETROGRADE PYELOGRAM/URETERAL STENT PLACEMENT;  Surgeon: Abbie Sons, MD;  Location: [facility] ORS;  Service: Urology;  Laterality: Right;   KIDNEY STONE SURGERY      Family History  Problem Relation Age of Onset   Cancer Mother        bladder   Emphysema Father    Emphysema Sister    COPD Sister    Cancer Brother        prostate   Social History:  reports that he has never smoked. He has never used smokeless tobacco. He reports that he does not drink alcohol and does not use drugs.  Allergies:  Allergies  Allergen Reactions   Codeine Other (See Comments)    Unsure of reaction    Medications Prior to Admission  Medication Sig Dispense Refill   cyanocobalamin (VITAMIN B12) 1000 MCG tablet Take 1 tablet (1,000 mcg total) by mouth daily. 90 tablet 4   fluvoxaMINE (LUVOX) 100 MG tablet Take 1 tablet (100 mg total) by mouth at bedtime.     glucose blood (ACCU-CHEK GUIDE) test strip Use to check blood sugars 2-3 times daily with goals = <130 fasting in morning and <180 two hours after eating. Bring blood sugar log to appointments. 100 each 12   lisinopril (ZESTRIL) 5 MG tablet Take 1 tablet (5 mg total) by mouth daily. 90 tablet 4   OLANZapine (ZYPREXA) 5 MG tablet Take 5 mg by mouth daily.     oxybutynin (DITROPAN) 5 MG tablet Take 1 tablet (5 mg total) by mouth every 8 (eight) hours as needed for bladder spasms (frequency,urgency).     rosuvastatin (CRESTOR) 10 MG tablet Take 1 tablet (10 mg total) by mouth  daily. 90 tablet 4   sitaGLIPtin (JANUVIA) 100 MG tablet Take 1 tablet (100 mg total) by mouth daily. 30 tablet 3   tamsulosin (FLOMAX) 0.4 MG CAPS capsule Take 1 capsule (0.4 mg total) by mouth daily. 30 capsule    Vitamin D, Ergocalciferol, (DRISDOL) 1.25 MG (50000 UNIT) CAPS capsule Take 1 capsule (50,000 Units total) by mouth every 7 (seven) days. 12 capsule 0    Results for orders placed or performed during the hospital encounter of 10/19/22 (from the past 48 hour(s))  Glucose, capillary     Status: Abnormal   Collection Time: 10/24/22  9:15 PM  Result Value Ref Range   Glucose-Capillary 146 (H) 70 - 99 mg/dL    Comment: Glucose reference range applies only to samples taken after fasting for at least 8 hours.  Glucose, capillary     Status: Abnormal   Collection Time: 10/25/22  7:46 AM  Result Value Ref Range   Glucose-Capillary 165 (H) 70 - 99 mg/dL    Comment: Glucose reference range applies only to samples taken after fasting for at least 8 hours.  Glucose, capillary     Status: Abnormal   Collection Time: 10/25/22 11:47 AM  Result Value Ref Range   Glucose-Capillary 208 (H) 70 - 99  mg/dL    Comment: Glucose reference range applies only to samples taken after fasting for at least 8 hours.  Glucose, capillary     Status: Abnormal   Collection Time: 10/25/22  4:12 PM  Result Value Ref Range   Glucose-Capillary 237 (H) 70 - 99 mg/dL    Comment: Glucose reference range applies only to samples taken after fasting for at least 8 hours.  Glucose, capillary     Status: Abnormal   Collection Time: 10/25/22  8:16 PM  Result Value Ref Range   Glucose-Capillary 189 (H) 70 - 99 mg/dL    Comment: Glucose reference range applies only to samples taken after fasting for at least 8 hours.  Glucose, capillary     Status: Abnormal   Collection Time: 10/26/22  8:51 AM  Result Value Ref Range   Glucose-Capillary 204 (H) 70 - 99 mg/dL    Comment: Glucose reference range applies only to samples  taken after fasting for at least 8 hours.  Glucose, capillary     Status: Abnormal   Collection Time: 10/26/22  1:17 PM  Result Value Ref Range   Glucose-Capillary 172 (H) 70 - 99 mg/dL    Comment: Glucose reference range applies only to samples taken after fasting for at least 8 hours.  Glucose, capillary     Status: Abnormal   Collection Time: 10/26/22  3:55 PM  Result Value Ref Range   Glucose-Capillary 238 (H) 70 - 99 mg/dL    Comment: Glucose reference range applies only to samples taken after fasting for at least 8 hours.   Comment 1 Notify RN    No results found.  Review of Systems  Constitutional: Negative.   HENT: Negative.    Eyes: Negative.   Respiratory: Negative.    Cardiovascular: Negative.   Gastrointestinal: Negative.   Musculoskeletal: Negative.   Skin: Negative.   Neurological: Negative.   Psychiatric/Behavioral: Negative.      Blood pressure 103/68, pulse 81, temperature 98.3 F (36.8 C), resp. rate 18, height 5\' 9"  (1.753 m), weight 85.5 kg, SpO2 100 %. Physical Exam Vitals and nursing note reviewed.  Constitutional:      Appearance: He is well-developed.  HENT:     Head: Normocephalic and atraumatic.  Eyes:     Conjunctiva/sclera: Conjunctivae normal.     Pupils: Pupils are equal, round, and reactive to light.  Cardiovascular:     Heart sounds: Normal heart sounds.  Pulmonary:     Effort: Pulmonary effort is normal.  Abdominal:     Palpations: Abdomen is soft.  Musculoskeletal:        General: Normal range of motion.     Cervical back: Normal range of motion.  Skin:    General: Skin is warm and dry.  Neurological:     General: No focal deficit present.     Mental Status: He is alert.  Psychiatric:        Mood and Affect: Mood normal.        Thought Content: Thought content normal.      Assessment/Plan Seems to have shown some improvement in stabilizing.  I am anticipating this may be our last treatment in this series.  Alethia Berthold,  MD 10/26/2022, 6:16 PM

## 2022-10-26 NOTE — Procedures (Signed)
ECT SERVICES Physician's Interval Evaluation & Treatment Note  Patient Identification: Dillon [name] MRN:  [id number] Date of Evaluation:  10/26/2022 TX #: 3  MADRS:   MMSE:   P.E. Findings:  No change to physical exam  Psychiatric Interval Note:  Patient feels that his mood has been good  Subjective:  Patient is a 72 y.o. male seen for evaluation for Electroconvulsive Therapy. No new complaints  Treatment Summary:   []   Right Unilateral             [x]  Bilateral   % Energy : 1.0 ms 50%   Impedance: 1520 ohms  Seizure Energy Index: No reading  Postictal Suppression Index: No reading  Seizure Concordance Index: 44% but this is inaccurate.  One of the leads had come off and so concordance is meaningless  Medications  Pre Shock: Propofol 40 mg ketamine 40 mg succinylcholine 80 mg labetalol 5 mg  Post Shock:    Seizure Duration: 22 seconds EMG 37 seconds EEG   Comments: Follow-up probably not at this time I think we are probably going to and the index treatment now.  Lungs:  [x]   Clear to auscultation               []  Other:   Heart:    [x]   Regular rhythm             []  irregular rhythm    [x]   Previous H&P reviewed, patient examined and there are NO CHANGES                 []   Previous H&P reviewed, patient examined and there are changes noted.   Alethia Berthold, MD 1/10/20246:21 PM

## 2022-10-26 NOTE — BH IP Treatment Plan (Signed)
Interdisciplinary Treatment and Diagnostic Plan Update  10/26/2022 Time of Session: 9:30AM Dillon [name] MRN: [id number]  Principal Diagnosis: Severe recurrent major depression without psychotic features [facility])  Secondary Diagnoses: Principal Problem:   Severe recurrent major depression without psychotic features ([place])   Current Medications:  Current Facility-Administered Medications  Medication Dose Route Frequency Provider Last Rate Last Admin   acetaminophen (TYLENOL) tablet 650 mg  650 mg Oral Q6H PRN Clapacs, Madie Reno, MD       alum & mag hydroxide-simeth (MAALOX/MYLANTA) 200-200-20 MG/5ML suspension 30 mL  30 mL Oral Q4H PRN Clapacs, John T, MD       cyanocobalamin (VITAMIN B12) tablet 1,000 mcg  1,000 mcg Oral Daily Clapacs, John T, MD   1,000 mcg at 10/25/22 7829   docusate sodium (COLACE) capsule 100 mg  100 mg Oral BID Clapacs, John T, MD   100 mg at 10/25/22 2121   feeding supplement (ENSURE ENLIVE / ENSURE PLUS) liquid 237 mL  237 mL Oral TID BM Clapacs, John T, MD   237 mL at 10/25/22 0922   fentaNYL (SUBLIMAZE) injection 25 mcg  25 mcg Intravenous Q5 min PRN [place] Service, Gijsbertus F, MD       fiber supplement (BANATROL TF) liquid 60 mL  60 mL Oral BID Clapacs, John T, MD   60 mL at 10/25/22 2124   fluvoxaMINE (LUVOX) tablet 100 mg  100 mg Oral [id number] Parks Ranger, DO   100 mg at 10/25/22 1621   insulin aspart (novoLOG) injection 0-9 Units  0-9 Units Subcutaneous TID AC & HS Clapacs, Madie Reno, MD   2 Units at 10/25/22 2122   magnesium hydroxide (MILK OF MAGNESIA) suspension 30 mL  30 mL Oral Daily PRN Clapacs, John T, MD   30 mL at 10/22/22 1222   multivitamin with minerals tablet 1 tablet  1 tablet Oral Daily Clapacs, Madie Reno, MD   1 tablet at 10/25/22 0921   OLANZapine (ZYPREXA) tablet 5 mg  5 mg Oral QHS Clapacs, John T, MD   5 mg at 10/25/22 2121   ondansetron (ZOFRAN) injection 4 mg  4 mg Intravenous Once PRN [place] Service, Gijsbertus F, MD       oxybutynin  (DITROPAN) tablet 5 mg  5 mg Oral Q8H PRN Clapacs, John T, MD       polyethylene glycol (MIRALAX / GLYCOLAX) packet 17 g  17 g Oral Daily Clapacs, John T, MD   17 g at 10/25/22 0920   rosuvastatin (CRESTOR) tablet 10 mg  10 mg Oral Daily Clapacs, Madie Reno, MD   10 mg at 10/25/22 5621   senna-docusate (Senokot-S) tablet 1 tablet  1 tablet Oral BID Clapacs, Madie Reno, MD   1 tablet at 10/25/22 2121   tamsulosin (FLOMAX) capsule 0.4 mg  0.4 mg Oral Daily Clapacs, John T, MD   0.4 mg at 10/25/22 3086   Vitamin D (Ergocalciferol) (DRISDOL) 1.25 MG (50000 UNIT) capsule 50,000 Units  50,000 Units Oral Q7 days Clapacs, Madie Reno, MD   50,000 Units at 10/21/22 5784   PTA Medications: Medications Prior to Admission  Medication Sig Dispense Refill Last Dose   cyanocobalamin (VITAMIN B12) 1000 MCG tablet Take 1 tablet (1,000 mcg total) by mouth daily. 90 tablet 4 10/18/2022   fluvoxaMINE (LUVOX) 100 MG tablet Take 1 tablet (100 mg total) by mouth at bedtime.   10/18/2022   glucose blood (ACCU-CHEK GUIDE) test strip Use to check blood sugars 2-3 times daily with goals = <  130 fasting in morning and <180 two hours after eating. Bring blood sugar log to appointments. 100 each 12 10/18/2022   lisinopril (ZESTRIL) 5 MG tablet Take 1 tablet (5 mg total) by mouth daily. 90 tablet 4 10/18/2022   OLANZapine (ZYPREXA) 5 MG tablet Take 5 mg by mouth daily.   10/18/2022   oxybutynin (DITROPAN) 5 MG tablet Take 1 tablet (5 mg total) by mouth every 8 (eight) hours as needed for bladder spasms (frequency,urgency).   10/18/2022   rosuvastatin (CRESTOR) 10 MG tablet Take 1 tablet (10 mg total) by mouth daily. 90 tablet 4 10/18/2022   sitaGLIPtin (JANUVIA) 100 MG tablet Take 1 tablet (100 mg total) by mouth daily. 30 tablet 3 10/18/2022   tamsulosin (FLOMAX) 0.4 MG CAPS capsule Take 1 capsule (0.4 mg total) by mouth daily. 30 capsule  10/18/2022   Vitamin D, Ergocalciferol, (DRISDOL) 1.25 MG (50000 UNIT) CAPS capsule Take 1 capsule (50,000 Units total)  by mouth every 7 (seven) days. 12 capsule 0 10/18/2022    Patient Stressors:    Patient Strengths:    Treatment Modalities: Medication Management, Group therapy, Case management,  1 to 1 session with clinician, Psychoeducation, Recreational therapy.   Physician Treatment Plan for Primary Diagnosis: Severe recurrent major depression without psychotic features (HCC) Long Term Goal(s): Improvement in symptoms so as ready for discharge   Short Term Goals: Ability to identify changes in lifestyle to reduce recurrence of condition will improve Ability to verbalize feelings will improve Ability to disclose and discuss suicidal ideas Ability to demonstrate self-control will improve Ability to identify and develop effective coping behaviors will improve Ability to maintain clinical measurements within normal limits will improve Compliance with prescribed medications will improve Ability to identify triggers associated with substance abuse/mental health issues will improve  Medication Management: Evaluate patient's response, side effects, and tolerance of medication regimen.  Therapeutic Interventions: 1 to 1 sessions, Unit Group sessions and Medication administration.  Evaluation of Outcomes: Progressing  Physician Treatment Plan for Secondary Diagnosis: Principal Problem:   Severe recurrent major depression without psychotic features (HCC)  Long Term Goal(s): Improvement in symptoms so as ready for discharge   Short Term Goals: Ability to identify changes in lifestyle to reduce recurrence of condition will improve Ability to verbalize feelings will improve Ability to disclose and discuss suicidal ideas Ability to demonstrate self-control will improve Ability to identify and develop effective coping behaviors will improve Ability to maintain clinical measurements within normal limits will improve Compliance with prescribed medications will improve Ability to identify triggers associated  with substance abuse/mental health issues will improve     Medication Management: Evaluate patient's response, side effects, and tolerance of medication regimen.  Therapeutic Interventions: 1 to 1 sessions, Unit Group sessions and Medication administration.  Evaluation of Outcomes: Progressing   RN Treatment Plan for Primary Diagnosis: Severe recurrent major depression without psychotic features (HCC) Long Term Goal(s): Knowledge of disease and therapeutic regimen to maintain health will improve  Short Term Goals: Ability to remain free from injury will improve, Ability to verbalize frustration and anger appropriately will improve, Ability to demonstrate self-control, Ability to participate in decision making will improve, Ability to verbalize feelings will improve, Ability to disclose and discuss suicidal ideas, Ability to identify and develop effective coping behaviors will improve, and Compliance with prescribed medications will improve  Medication Management: RN will administer medications as ordered by provider, will assess and evaluate patient's response and provide education to patient for prescribed medication. RN will report  any adverse and/or side effects to prescribing provider.  Therapeutic Interventions: 1 on 1 counseling sessions, Psychoeducation, Medication administration, Evaluate responses to treatment, Monitor vital signs and CBGs as ordered, Perform/monitor CIWA, COWS, AIMS and Fall Risk screenings as ordered, Perform wound care treatments as ordered.  Evaluation of Outcomes: Progressing   LCSW Treatment Plan for Primary Diagnosis: Severe recurrent major depression without psychotic features ([place]) Long Term Goal(s): Safe transition to appropriate next level of care at discharge, Engage patient in therapeutic group addressing interpersonal concerns.  Short Term Goals: Engage patient in aftercare planning with referrals and resources, Increase social support, Increase ability  to appropriately verbalize feelings, Increase emotional regulation, Facilitate acceptance of mental health diagnosis and concerns, and Increase skills for wellness and recovery  Therapeutic Interventions: Assess for all discharge needs, 1 to 1 time with Social worker, Explore available resources and support systems, Assess for adequacy in community support network, Educate family and significant other(s) on suicide prevention, Complete Psychosocial Assessment, Interpersonal group therapy.  Evaluation of Outcomes: Progressing   Progress in Treatment: Attending groups: Yes. Participating in groups: Yes. Taking medication as prescribed: Yes. Toleration medication: Yes. Family/Significant other contact made: Yes, individual(s) contacted:  SPE completed with pt's daughter Tiandre Teall Patient understands diagnosis: Yes. Discussing patient identified problems/goals with staff: Yes. Medical problems stabilized or resolved: Yes. Denies suicidal/homicidal ideation: Yes. Issues/concerns per patient self-inventory: No. Other: None  New problem(s) identified: No, Describe:  None  New Short Term/Long Term Goal(s): Patient to work towards medication management for mood stabilization; elimination of SI thoughts; development of comprehensive mental wellness plan. Update 10/26/22: No changes at this time.    Patient Goals:  Patient states their goal for treatment is to "get my eating up and get out." Update 10/26/22: No changes at this time.    Discharge Plan or Barriers: No psychosocial barriers identified at this time, patient to return to place of residence when appropriate for discharge. Update 10/26/22: No changes at this time.    Reason for Continuation of Hospitalization: Depression Medication stabilization   Estimated Length of Stay: 1-7 days   Last 3 [place] Suicide Severity Risk Score: Flowsheet Row Admission (Current) from 10/19/2022 in [facility] to Hosp-Admission  (Discharged) from 10/11/2022 in [place] No Risk No Risk       Last PHQ 2/9 Scores:    10/11/2022   12:16 PM 04/08/2022    1:58 PM 02/10/2022   11:21 AM  Depression screen PHQ 2/9  Decreased Interest 3 0 0  Down, Depressed, Hopeless 2 0 0  PHQ - 2 Score 5 0 0  Altered sleeping 2 2 0  Tired, decreased energy 2 2 0  Change in appetite 3 0 0  Feeling bad or failure about yourself  2 0 0  Trouble concentrating 1 0 0  Moving slowly or fidgety/restless 1 0 0  Suicidal thoughts 0 0 0  PHQ-9 Score 16 4 0  Difficult doing work/chores Very difficult Not difficult at all Not difficult at all    Scribe for Treatment Team: Anushri Casalino A [place], [place] 10/26/2022 11:41 AM

## 2022-10-27 DIAGNOSIS — F332 Major depressive disorder, recurrent severe without psychotic features: Secondary | ICD-10-CM | POA: Diagnosis not present

## 2022-10-27 LAB — GLUCOSE, CAPILLARY
Glucose-Capillary: 158 mg/dL — ABNORMAL HIGH (ref 70–99)
Glucose-Capillary: 211 mg/dL — ABNORMAL HIGH (ref 70–99)
Glucose-Capillary: 202 mg/dL — ABNORMAL HIGH (ref 70–99)
Glucose-Capillary: 160 mg/dL — ABNORMAL HIGH (ref 70–99)

## 2022-10-27 NOTE — Plan of Care (Signed)

## 2022-10-27 NOTE — Group Note (Signed)
[facility] LCSW Group Therapy Note   Group Date: 10/27/2022 Start Time: 1610 End Time: 1500  Type of Therapy and Topic:  Group Therapy:  Feelings around Relapse and Recovery  Participation Level:  Active   Mood:  Description of Group:    Patients in this group will discuss emotions they experience before and after a relapse. They will process how experiencing these feelings, or avoidance of experiencing them, relates to having a relapse. Facilitator will guide patients to explore emotions they have related to recovery. Patients will be encouraged to process which emotions are more powerful. They will be guided to discuss the emotional reaction significant others in their lives may have to patients' relapse or recovery. Patients will be assisted in exploring ways to respond to the emotions of others without this contributing to a relapse.  Therapeutic Goals: Patient will identify two or more emotions that lead to relapse for them:  Patient will identify two emotions that result when they relapse:  Patient will identify two emotions related to recovery:  Patient will demonstrate ability to communicate their needs through discussion and/or role plays.   Summary of Patient Progress:  Patient was present for the entirety of the group session. Patient was an active listener and participated in the topic of discussion, provided helpful advice to others, and added nuance to topic of conversation.  CSW led participants in assessing self-care to aid in development of a personal recovery model. He said it was "splitting hairs" in reference to thinking about certain aspects of his self care.    Therapeutic Modalities:   Cognitive Behavioral Therapy Solution-Focused Therapy Assertiveness Training Relapse Prevention Therapy   Asti Mackley A [place], LCSWA

## 2022-10-27 NOTE — Anesthesia Postprocedure Evaluation (Signed)
Anesthesia Post Note  Patient: Dillon [name]  Procedure(s) Performed: ECT TX  Patient location during evaluation: PACU Anesthesia Type: General Level of consciousness: awake and alert Pain management: pain level controlled Vital Signs Assessment: post-procedure vital signs reviewed and stable Respiratory status: spontaneous breathing, nonlabored ventilation, respiratory function stable and patient connected to nasal cannula oxygen Cardiovascular status: blood pressure returned to baseline and stable Postop Assessment: no apparent nausea or vomiting Anesthetic complications: no   No notable events documented.   Last Vitals:  Vitals:   10/26/22 1505 10/26/22 1957  BP: 103/68 118/77  Pulse: 81 81  Resp: 18 16  Temp: 36.8 C 36.6 C  SpO2: 100% 97%    Last Pain:  Vitals:   10/26/22 2100  TempSrc:   PainSc: 0-No pain                 Ilene Qua

## 2022-10-27 NOTE — [facility] Group Notes (Signed)
Psychoeducational group completed.  Patients were given two poems to read, and asked within the framework of cognitive behavioral therapy and positive affirmations,  to identify one positive thought about themselves and one negative belief they would like to let go of.  Pt participated and was appropriate.  

## 2022-10-27 NOTE — Progress Notes (Signed)
Patient with anxious affect.  Patient endorses anxiety, but states it has improved.  Denies depression, SI/HI and AVH. Denies pain.  Patient reports he slept okay and ate a good breakfast.  Compliant with CBGs and scheduled medications.  15 min checks in place.  Patient is occasionally present in the milieu.  Appropriate interaction with peers.  Patient attended MHT group.  Patient is quiet and cooperative with staff.

## 2022-10-27 NOTE — Progress Notes (Signed)
[facility] MD Progress Note  10/27/2022 11:51 AM Dillon [name]  MRN:  [id number] Subjective: Dillon [name] is seen on rounds.  He states that he is feeling a little bit better.  I talked with Dr. Weber Cooks who says that he thinks he does not need any more ECT at this moment.  He is on Luvox which I just increased and Zyprexa.  We will continue to monitor and see how he does.  He is still very depressed and isolative to his room.  Principal Problem: Severe recurrent major depression without psychotic features ([place]) Diagnosis: Principal Problem:   Severe recurrent major depression without psychotic features ([place])  Total Time spent with patient: 15 minutes  Past Psychiatric History:  ECT back in 2018. He has been on Luvox in the past. I believe he was supposed to follow-up at [facility] at that time.    Past Medical History:  Past Medical History:  Diagnosis Date   Kidney stone    Meningitis    spinal   OCD (obsessive compulsive disorder)    Shingles     Past Surgical History:  Procedure Laterality Date   CYSTOSCOPY W/ URETERAL STENT PLACEMENT Right 10/12/2022   Procedure: CYSTOSCOPY WITH RETROGRADE PYELOGRAM/URETERAL STENT PLACEMENT;  Surgeon: Abbie Sons, MD;  Location: [facility] ORS;  Service: Urology;  Laterality: Right;   KIDNEY STONE SURGERY     Family History:  Family History  Problem Relation Age of Onset   Cancer Mother        bladder   Emphysema Father    Emphysema Sister    COPD Sister    Cancer Brother        prostate   Family Psychiatric  History: Unremarkable Social History:  Social History   Substance and Sexual Activity  Alcohol Use No   Alcohol/week: 0.0 standard drinks of alcohol     Social History   Substance and Sexual Activity  Drug Use No    Social History   Socioeconomic History   Marital status: Widowed    Spouse name: Not on file   Number of children: Not on file   Years of education: Not on file   Highest education level: Not on file  Occupational  History   Occupation: retired  Tobacco Use   Smoking status: Never   Smokeless tobacco: Never  Vaping Use   Vaping Use: Never used  Substance and Sexual Activity   Alcohol use: No    Alcohol/week: 0.0 standard drinks of alcohol   Drug use: No   Sexual activity: Not Currently  Other Topics Concern   Not on file  Social History Narrative   Not on file   Social Determinants of Health   Financial Resource Strain: Low Risk  (02/10/2022)   Overall Financial Resource Strain (CARDIA)    Difficulty of Paying Living Expenses: Not hard at all  Food Insecurity: No Food Insecurity (10/19/2022)   Hunger Vital Sign    Worried About Running Out of Food in the Last Year: Never true    [place] in the Last Year: Never true  Transportation Needs: No Transportation Needs (10/19/2022)   PRAPARE - Hydrologist (Medical): No    Lack of Transportation (Non-Medical): No  Physical Activity: Inactive (02/10/2022)   Exercise Vital Sign    Days of Exercise per Week: 0 days    Minutes of Exercise per Session: 0 min  Stress: No Stress Concern Present (02/10/2022)   [employer] of  Occupational Health - Occupational Stress Questionnaire    Feeling of Stress : Not at all  Social Connections: Moderately Integrated (02/10/2022)   Social Connection and Isolation Panel [NHANES]    Frequency of Communication with Friends and Family: Twice a week    Frequency of Social Gatherings with Friends and Family: Twice a week    Attends Religious Services: 1 to 4 times per year    Active Member of [employer] or Organizations: No    Attends Engineer, structural: More than 4 times per year    Marital Status: Widowed   Additional Social History:                         Sleep: Fair  Appetite:  Fair  Current Medications: Current Facility-Administered Medications  Medication Dose Route Frequency Provider Last Rate Last Admin   acetaminophen (TYLENOL) tablet 650 mg  650  mg Oral Q6H PRN Clapacs, John T, MD       alum & mag hydroxide-simeth (MAALOX/MYLANTA) 200-200-20 MG/5ML suspension 30 mL  30 mL Oral Q4H PRN Clapacs, John T, MD       cyanocobalamin (VITAMIN B12) tablet 1,000 mcg  1,000 mcg Oral Daily Clapacs, John T, MD   1,000 mcg at 10/27/22 0900   docusate sodium (COLACE) capsule 100 mg  100 mg Oral BID Clapacs, John T, MD   100 mg at 10/27/22 0900   feeding supplement (ENSURE ENLIVE / ENSURE PLUS) liquid 237 mL  237 mL Oral TID BM Clapacs, John T, MD   237 mL at 10/27/22 1032   fentaNYL (SUBLIMAZE) injection 25 mcg  25 mcg Intravenous Q5 min PRN Darleene Cleaver, Gijsbertus F, MD       fluvoxaMINE (LUVOX) tablet 100 mg  100 mg Oral [id number] Sarina Ill, DO   100 mg at 10/27/22 4742   insulin aspart (novoLOG) injection 0-9 Units  0-9 Units Subcutaneous TID AC & HS Clapacs, Jackquline Denmark, MD   2 Units at 10/27/22 0831   magnesium hydroxide (MILK OF MAGNESIA) suspension 30 mL  30 mL Oral Daily PRN Clapacs, John T, MD   30 mL at 10/22/22 1222   multivitamin with minerals tablet 1 tablet  1 tablet Oral Daily Clapacs, Jackquline Denmark, MD   1 tablet at 10/27/22 0859   OLANZapine (ZYPREXA) tablet 10 mg  10 mg Oral QHS Sarina Ill, DO   10 mg at 10/26/22 2123   ondansetron (ZOFRAN) injection 4 mg  4 mg Intravenous Once PRN Darleene Cleaver, Gerrit Heck, MD       oxybutynin (DITROPAN) tablet 5 mg  5 mg Oral Q8H PRN Clapacs, John T, MD       polyethylene glycol (MIRALAX / GLYCOLAX) packet 17 g  17 g Oral Daily Clapacs, John T, MD   17 g at 10/27/22 0859   rosuvastatin (CRESTOR) tablet 10 mg  10 mg Oral Daily Clapacs, Jackquline Denmark, MD   10 mg at 10/27/22 0859   senna-docusate (Senokot-S) tablet 1 tablet  1 tablet Oral BID Clapacs, Jackquline Denmark, MD   1 tablet at 10/27/22 0859   tamsulosin (FLOMAX) capsule 0.4 mg  0.4 mg Oral Daily Clapacs, John T, MD   0.4 mg at 10/27/22 0900   Vitamin D (Ergocalciferol) (DRISDOL) 1.25 MG (50000 UNIT) capsule 50,000 Units  50,000 Units Oral Q7 days  Clapacs, Jackquline Denmark, MD   50,000 Units at 10/21/22 5956    Lab Results:  Results for orders  placed or performed during the hospital encounter of 10/19/22 (from the past 48 hour(s))  Glucose, capillary     Status: Abnormal   Collection Time: 10/25/22  4:12 PM  Result Value Ref Range   Glucose-Capillary 237 (H) 70 - 99 mg/dL    Comment: Glucose reference range applies only to samples taken after fasting for at least 8 hours.  Glucose, capillary     Status: Abnormal   Collection Time: 10/25/22  8:16 PM  Result Value Ref Range   Glucose-Capillary 189 (H) 70 - 99 mg/dL    Comment: Glucose reference range applies only to samples taken after fasting for at least 8 hours.  Glucose, capillary     Status: Abnormal   Collection Time: 10/26/22  8:51 AM  Result Value Ref Range   Glucose-Capillary 204 (H) 70 - 99 mg/dL    Comment: Glucose reference range applies only to samples taken after fasting for at least 8 hours.  Glucose, capillary     Status: Abnormal   Collection Time: 10/26/22  1:17 PM  Result Value Ref Range   Glucose-Capillary 172 (H) 70 - 99 mg/dL    Comment: Glucose reference range applies only to samples taken after fasting for at least 8 hours.  Glucose, capillary     Status: Abnormal   Collection Time: 10/26/22  3:55 PM  Result Value Ref Range   Glucose-Capillary 238 (H) 70 - 99 mg/dL    Comment: Glucose reference range applies only to samples taken after fasting for at least 8 hours.   Comment 1 Notify RN   Glucose, capillary     Status: Abnormal   Collection Time: 10/26/22  7:59 PM  Result Value Ref Range   Glucose-Capillary 138 (H) 70 - 99 mg/dL    Comment: Glucose reference range applies only to samples taken after fasting for at least 8 hours.  Glucose, capillary     Status: Abnormal   Collection Time: 10/27/22  7:51 AM  Result Value Ref Range   Glucose-Capillary 158 (H) 70 - 99 mg/dL    Comment: Glucose reference range applies only to samples taken after fasting for at  least 8 hours.  Glucose, capillary     Status: Abnormal   Collection Time: 10/27/22 11:37 AM  Result Value Ref Range   Glucose-Capillary 211 (H) 70 - 99 mg/dL    Comment: Glucose reference range applies only to samples taken after fasting for at least 8 hours.    Blood Alcohol level:  Lab Results  Component Value Date   ETH <5 01/04/2016   ETH <5 12/15/2015    Metabolic Disorder Labs: Lab Results  Component Value Date   HGBA1C 6.3 (H) 10/11/2022   MPG 134 10/11/2022   MPG 91 03/27/2017   Lab Results  Component Value Date   PROLACTIN 29.7 (H) 01/05/2016   Lab Results  Component Value Date   CHOL 144 10/11/2022   TRIG 148 10/11/2022   HDL 44 10/11/2022   CHOLHDL 4.6 03/27/2017   VLDL 23 03/27/2017   LDLCALC 74 10/11/2022   LDLCALC 45 04/08/2022    Physical Findings: AIMS:  , ,  ,  ,    CIWA:    COWS:     Musculoskeletal: Strength & Muscle Tone: within normal limits Gait & Station: normal Patient leans: N/A  Psychiatric Specialty Exam:  Presentation  General Appearance:  Appropriate for Environment  Eye Contact: Good  Speech: Clear and Coherent  Speech Volume: Normal  Handedness: Right  Mood and Affect  Mood: Anxious; Depressed  Affect: Congruent   Thought Process  Thought Processes: Linear  Descriptions of Associations:Intact  Orientation:Full (Time, Place and Person)  Thought Content:Paranoid Ideation ("I will have diarrhea if  I eat or drink")  History of Schizophrenia/Schizoaffective disorder:No  Duration of Psychotic Symptoms:No data recorded Hallucinations:No data recorded Ideas of Reference:Paranoia  Suicidal Thoughts:No data recorded Homicidal Thoughts:No data recorded  Sensorium  Memory: Immediate Good  Judgment: Poor  Insight: Fair   Community education officer  Concentration: Fair  Attention Span: Good  Recall: [employer] of Knowledge: Fair  Language: Fair   Psychomotor Activity  Psychomotor  Activity:No data recorded  Assets  Assets: Desire for Improvement; Financial Resources/Insurance; Housing   Sleep  Sleep:No data recorded   Physical Exam: Physical Exam Vitals and nursing note reviewed.  Constitutional:      Appearance: Normal appearance. He is normal weight.  Neurological:     General: No focal deficit present.     Mental Status: He is alert and oriented to person, place, and time.  Psychiatric:        Attention and Perception: Attention and perception normal.        Mood and Affect: Mood is depressed. Affect is flat.        Speech: Speech normal.        Behavior: Behavior normal. Behavior is cooperative.        Thought Content: Thought content normal.        Cognition and Memory: Cognition and memory normal.        Judgment: Judgment normal.    Review of Systems  Constitutional: Negative.   HENT: Negative.    Eyes: Negative.   Respiratory: Negative.    Cardiovascular: Negative.   Gastrointestinal: Negative.   Genitourinary: Negative.   Musculoskeletal: Negative.   Skin: Negative.   Neurological: Negative.   Endo/Heme/Allergies: Negative.   Psychiatric/Behavioral:  Positive for depression.    Blood pressure 116/87, pulse 92, temperature 97.9 F (36.6 C), temperature source Oral, resp. rate 18, height 5\' 9"  (1.753 m), weight 85.5 kg, SpO2 99 %. Body mass index is 27.84 kg/m.   Treatment Plan Summary: Daily contact with patient to assess and evaluate symptoms and progress in treatment, Medication management, and Plan continue current medications.  Parks Ranger, DO 10/27/2022, 11:51 AM

## 2022-10-28 DIAGNOSIS — F332 Major depressive disorder, recurrent severe without psychotic features: Secondary | ICD-10-CM | POA: Diagnosis not present

## 2022-10-28 LAB — GLUCOSE, CAPILLARY
Glucose-Capillary: 177 mg/dL — ABNORMAL HIGH (ref 70–99)
Glucose-Capillary: 193 mg/dL — ABNORMAL HIGH (ref 70–99)
Glucose-Capillary: 230 mg/dL — ABNORMAL HIGH (ref 70–99)
Glucose-Capillary: 182 mg/dL — ABNORMAL HIGH (ref 70–99)

## 2022-10-28 MED ORDER — TRAZODONE HCL 50 MG PO TABS
50.0000 mg | ORAL_TABLET | Freq: Every day | ORAL | Status: DC
  Administered 2022-10-28 – 2022-11-01 (×5): 50 mg via ORAL
  Filled 2022-10-28 (×5): qty 1

## 2022-10-28 NOTE — Plan of Care (Signed)
  Problem: Education: Goal: Knowledge of General Education information will improve Description: Including pain rating scale, medication(s)/side effects and non-pharmacologic comfort measures 10/28/2022 1241 by Joesph Fillers, RN Outcome: Progressing 10/28/2022 1222 by Joesph Fillers, RN Outcome: Progressing   Problem: Activity: Goal: Risk for activity intolerance will decrease Outcome: Progressing   Problem: Nutrition: Goal: Adequate nutrition will be maintained Outcome: Progressing   Problem: Coping: Goal: Level of anxiety will decrease Outcome: Progressing

## 2022-10-28 NOTE — Progress Notes (Signed)
Patient with animated affect.  "I feel good."  Patient reports his anxiety has improved.  Denies SI/HI, AVH and depression.  Denies pain.  ECT treatments have ended.  Patient is cooperative and soft spoken.  Compliant with CBGs and scheduled medications. 15 min checks in place. Patient is safe on the unit.

## 2022-10-28 NOTE — Group Note (Signed)
[facility] LCSW Group Therapy Note   Group Date: 10/28/2022 Start Time: 4132 End Time: 1515   Type of Therapy/Topic:  Group Therapy:  Emotion Regulation  Participation Level:  Active   Mood:  Description of Group:    The purpose of this group is to assist patients in learning to regulate negative emotions and experience positive emotions. Patients will be guided to discuss ways in which they have been vulnerable to their negative emotions. These vulnerabilities will be juxtaposed with experiences of positive emotions or situations, and patients challenged to use positive emotions to combat negative ones. Special emphasis will be placed on coping with negative emotions in conflict situations, and patients will process healthy conflict resolution skills.  Therapeutic Goals: Patient will identify two positive emotions or experiences to reflect on in order to balance out negative emotions:  Patient will label two or more emotions that they find the most difficult to experience:  Patient will be able to demonstrate positive conflict resolution skills through discussion or role plays:   Summary of Patient Progress:   CSW led group in gratitude activity exploration. Patient was present for the entirety of the group session. Patient was an active listener and participated in the topic of discussion, provided helpful advice to others, and added nuance to topic of conversation.  He said what keeps him motivated was going to work but struggled after he retired because "my wife died" 57 days after his retirement started. He said she was "always waiting for me to grow up."    Therapeutic Modalities:   Cognitive Behavioral Therapy Feelings Identification Dialectical Behavioral Therapy   Aylan Bayona A [place], LCSWA

## 2022-10-28 NOTE — Progress Notes (Signed)
   10/28/22 1300  Group Notes  Time of group 1200  Type of therapy Psychoeducational skills  Participation level Active  Participation quality Appropriate;Attentive  Affect Appropriate  Cognitive Alert;Appropriate;Oriented  Modes of intervention Support;Socialization  Summary of progress / Problems addressed Patients took turns passing the activity ball around and answering various questions that promote the use of cognition, thoughts, and feelings. Patient answered questions adequately, able to verbalize feelings, and able to socialize appropriately.

## 2022-10-28 NOTE — Progress Notes (Signed)
[facility] MD Progress Note  10/28/2022 11:56 AM Dillon [name]  MRN:  [id number] Subjective: Dillon [name] is seen on rounds.  He has been compliant with medications.  He feels like ECT is helping and his medicines are also helping.  His sleeping is still a problem.  His mood is improving.  He denies any side effects from his current medications.  I spoke to him about starting trazodone tonight to see if that helps.  He was in agreement. Principal Problem: Severe recurrent major depression without psychotic features (HCC) Diagnosis: Principal Problem:   Severe recurrent major depression without psychotic features (HCC)  Total Time spent with patient: 15 minutes  Past Psychiatric History:   ECT back in 2018. He has been on Luvox in the past. I believe he was supposed to follow-up at [facility] at that time.    Past Medical History:  Past Medical History:  Diagnosis Date   Kidney stone    Meningitis    spinal   OCD (obsessive compulsive disorder)    Shingles     Past Surgical History:  Procedure Laterality Date   CYSTOSCOPY W/ URETERAL STENT PLACEMENT Right 10/12/2022   Procedure: CYSTOSCOPY WITH RETROGRADE PYELOGRAM/URETERAL STENT PLACEMENT;  Surgeon: Riki Altes, MD;  Location: [facility] ORS;  Service: Urology;  Laterality: Right;   KIDNEY STONE SURGERY     Family History:  Family History  Problem Relation Age of Onset   Cancer Mother        bladder   Emphysema Father    Emphysema Sister    COPD Sister    Cancer Brother        prostate    Social History:  Social History   Substance and Sexual Activity  Alcohol Use No   Alcohol/week: 0.0 standard drinks of alcohol     Social History   Substance and Sexual Activity  Drug Use No    Social History   Socioeconomic History   Marital status: Widowed    Spouse name: Not on file   Number of children: Not on file   Years of education: Not on file   Highest education level: Not on file  Occupational History   Occupation: retired   Tobacco Use   Smoking status: Never   Smokeless tobacco: Never  Vaping Use   Vaping Use: Never used  Substance and Sexual Activity   Alcohol use: No    Alcohol/week: 0.0 standard drinks of alcohol   Drug use: No   Sexual activity: Not Currently  Other Topics Concern   Not on file  Social History Narrative   Not on file   Social Determinants of Health   Financial Resource Strain: Low Risk  (02/10/2022)   Overall Financial Resource Strain (CARDIA)    Difficulty of Paying Living Expenses: Not hard at all  Food Insecurity: No Food Insecurity (10/19/2022)   Hunger Vital Sign    Worried About Running Out of Food in the Last Year: Never true    Ran Out of Food in the Last Year: Never true  Transportation Needs: No Transportation Needs (10/19/2022)   PRAPARE - Administrator, Civil Service (Medical): No    Lack of Transportation (Non-Medical): No  Physical Activity: Inactive (02/10/2022)   Exercise Vital Sign    Days of Exercise per Week: 0 days    Minutes of Exercise per Session: 0 min  Stress: No Stress Concern Present (02/10/2022)   [name] of Occupational Health - Occupational Stress Questionnaire  Feeling of Stress : Not at all  Social Connections: Moderately Integrated (02/10/2022)   Social Connection and Isolation Panel [NHANES]    Frequency of Communication with Friends and Family: Twice a week    Frequency of Social Gatherings with Friends and Family: Twice a week    Attends Religious Services: 1 to 4 times per year    Active Member of [employer] or Organizations: No    Attends Music therapist: More than 4 times per year    Marital Status: Widowed   Additional Social History:                         Sleep: Good  Appetite:  Good  Current Medications: Current Facility-Administered Medications  Medication Dose Route Frequency Provider Last Rate Last Admin   acetaminophen (TYLENOL) tablet 650 mg  650 mg Oral Q6H PRN Clapacs, John  T, MD       alum & mag hydroxide-simeth (MAALOX/MYLANTA) 200-200-20 MG/5ML suspension 30 mL  30 mL Oral Q4H PRN Clapacs, John T, MD       cyanocobalamin (VITAMIN B12) tablet 1,000 mcg  1,000 mcg Oral Daily Clapacs, John T, MD   1,000 mcg at 10/28/22 0906   docusate sodium (COLACE) capsule 100 mg  100 mg Oral BID Clapacs, John T, MD   100 mg at 10/28/22 0905   feeding supplement (ENSURE ENLIVE / ENSURE PLUS) liquid 237 mL  237 mL Oral TID BM Clapacs, John T, MD   237 mL at 10/28/22 1006   fentaNYL (SUBLIMAZE) injection 25 mcg  25 mcg Intravenous Q5 min PRN [place] Service, Gijsbertus F, MD       fluvoxaMINE (LUVOX) tablet 100 mg  100 mg Oral [id number] Parks Ranger, DO   100 mg at 10/28/22 0904   insulin aspart (novoLOG) injection 0-9 Units  0-9 Units Subcutaneous TID AC & HS Clapacs, Madie Reno, MD   2 Units at 10/28/22 2841   magnesium hydroxide (MILK OF MAGNESIA) suspension 30 mL  30 mL Oral Daily PRN Clapacs, John T, MD   30 mL at 10/22/22 1222   multivitamin with minerals tablet 1 tablet  1 tablet Oral Daily Clapacs, Madie Reno, MD   1 tablet at 10/28/22 0905   OLANZapine (ZYPREXA) tablet 10 mg  10 mg Oral QHS Parks Ranger, DO   10 mg at 10/27/22 2125   ondansetron (ZOFRAN) injection 4 mg  4 mg Intravenous Once PRN Boston Service, Jane Canary, MD       oxybutynin (DITROPAN) tablet 5 mg  5 mg Oral Q8H PRN Clapacs, John T, MD       polyethylene glycol (MIRALAX / GLYCOLAX) packet 17 g  17 g Oral Daily Clapacs, Madie Reno, MD   17 g at 10/28/22 0904   rosuvastatin (CRESTOR) tablet 10 mg  10 mg Oral Daily Clapacs, Madie Reno, MD   10 mg at 10/28/22 3244   senna-docusate (Senokot-S) tablet 1 tablet  1 tablet Oral BID Clapacs, Madie Reno, MD   1 tablet at 10/28/22 0905   tamsulosin (FLOMAX) capsule 0.4 mg  0.4 mg Oral Daily Clapacs, Madie Reno, MD   0.4 mg at 10/28/22 0102   Vitamin D (Ergocalciferol) (DRISDOL) 1.25 MG (50000 UNIT) capsule 50,000 Units  50,000 Units Oral Q7 days Clapacs, Madie Reno, MD   50,000  Units at 10/28/22 7253    Lab Results:  Results for orders placed or performed during the hospital encounter of 10/19/22 (  from the past 48 hour(s))  Glucose, capillary     Status: Abnormal   Collection Time: 10/26/22  1:17 PM  Result Value Ref Range   Glucose-Capillary 172 (H) 70 - 99 mg/dL    Comment: Glucose reference range applies only to samples taken after fasting for at least 8 hours.  Glucose, capillary     Status: Abnormal   Collection Time: 10/26/22  3:55 PM  Result Value Ref Range   Glucose-Capillary 238 (H) 70 - 99 mg/dL    Comment: Glucose reference range applies only to samples taken after fasting for at least 8 hours.   Comment 1 Notify RN   Glucose, capillary     Status: Abnormal   Collection Time: 10/26/22  7:59 PM  Result Value Ref Range   Glucose-Capillary 138 (H) 70 - 99 mg/dL    Comment: Glucose reference range applies only to samples taken after fasting for at least 8 hours.  Glucose, capillary     Status: Abnormal   Collection Time: 10/27/22  7:51 AM  Result Value Ref Range   Glucose-Capillary 158 (H) 70 - 99 mg/dL    Comment: Glucose reference range applies only to samples taken after fasting for at least 8 hours.  Glucose, capillary     Status: Abnormal   Collection Time: 10/27/22 11:37 AM  Result Value Ref Range   Glucose-Capillary 211 (H) 70 - 99 mg/dL    Comment: Glucose reference range applies only to samples taken after fasting for at least 8 hours.  Glucose, capillary     Status: Abnormal   Collection Time: 10/27/22  4:26 PM  Result Value Ref Range   Glucose-Capillary 202 (H) 70 - 99 mg/dL    Comment: Glucose reference range applies only to samples taken after fasting for at least 8 hours.  Glucose, capillary     Status: Abnormal   Collection Time: 10/27/22  9:20 PM  Result Value Ref Range   Glucose-Capillary 160 (H) 70 - 99 mg/dL    Comment: Glucose reference range applies only to samples taken after fasting for at least 8 hours.  Glucose,  capillary     Status: Abnormal   Collection Time: 10/28/22  8:01 AM  Result Value Ref Range   Glucose-Capillary 177 (H) 70 - 99 mg/dL    Comment: Glucose reference range applies only to samples taken after fasting for at least 8 hours.  Glucose, capillary     Status: Abnormal   Collection Time: 10/28/22 11:35 AM  Result Value Ref Range   Glucose-Capillary 193 (H) 70 - 99 mg/dL    Comment: Glucose reference range applies only to samples taken after fasting for at least 8 hours.    Blood Alcohol level:  Lab Results  Component Value Date   ETH <5 01/04/2016   ETH <5 37/62/8315    Metabolic Disorder Labs: Lab Results  Component Value Date   HGBA1C 6.3 (H) 10/11/2022   MPG 134 10/11/2022   MPG 91 03/27/2017   Lab Results  Component Value Date   PROLACTIN 29.7 (H) 01/05/2016   Lab Results  Component Value Date   CHOL 144 10/11/2022   TRIG 148 10/11/2022   HDL 44 10/11/2022   CHOLHDL 4.6 03/27/2017   VLDL 23 03/27/2017   LDLCALC 74 10/11/2022   LDLCALC 45 04/08/2022    Physical Findings: AIMS:  , ,  ,  ,    CIWA:    COWS:     Musculoskeletal: Strength & Muscle Tone: within  normal limits Gait & Station: normal Patient leans: N/A  Psychiatric Specialty Exam:  Presentation  General Appearance:  Appropriate for Environment  Eye Contact: Good  Speech: Clear and Coherent  Speech Volume: Normal  Handedness: Right   Mood and Affect  Mood: Anxious; Depressed  Affect: Congruent   Thought Process  Thought Processes: Linear  Descriptions of Associations:Intact  Orientation:Full (Time, Place and Person)  Thought Content:Paranoid Ideation ("I will have diarrhea if  I eat or drink")  History of Schizophrenia/Schizoaffective disorder:No  Duration of Psychotic Symptoms:No data recorded Hallucinations:No data recorded Ideas of Reference:Paranoia  Suicidal Thoughts:No data recorded Homicidal Thoughts:No data recorded  Sensorium   Memory: Immediate Good  Judgment: Poor  Insight: Fair   Art therapist  Concentration: Fair  Attention Span: Good  Recall: [employer] of Knowledge: Fair  Language: Fair   Psychomotor Activity  Psychomotor Activity:No data recorded  Assets  Assets: Desire for Improvement; Financial Resources/Insurance; Housing   Sleep  Sleep:No data recorded   Physical Exam: Physical Exam Vitals and nursing note reviewed.  Constitutional:      Appearance: Normal appearance. He is normal weight.  Neurological:     General: No focal deficit present.     Mental Status: He is alert and oriented to person, place, and time.  Psychiatric:        Attention and Perception: Attention and perception normal.        Mood and Affect: Mood is depressed. Affect is flat.        Speech: Speech normal.        Behavior: Behavior normal. Behavior is cooperative.        Thought Content: Thought content normal.        Cognition and Memory: Cognition and memory normal.        Judgment: Judgment normal.    Review of Systems  Constitutional: Negative.   HENT: Negative.    Eyes: Negative.   Respiratory: Negative.    Cardiovascular: Negative.   Gastrointestinal: Negative.   Genitourinary: Negative.   Musculoskeletal: Negative.   Skin: Negative.   Neurological: Negative.   Endo/Heme/Allergies: Negative.   Psychiatric/Behavioral:  Positive for depression.    Blood pressure 117/83, pulse 96, temperature 97.6 F (36.4 C), temperature source Oral, resp. rate 18, height 5\' 9"  (1.753 m), weight 85.5 kg, SpO2 97 %. Body mass index is 27.84 kg/m.   Treatment Plan Summary: Daily contact with patient to assess and evaluate symptoms and progress in treatment, Medication management, and Plan continue current medications and ECT.  Start trazodone 50 milligrams at bedtime.  Recently went up on his Luvox.  , DO 10/28/2022, 11:56 AM

## 2022-10-28 NOTE — Progress Notes (Signed)
D: Pt alert and oriented. Pt denies experiencing any anxiety/depression at this time. Pt denies experiencing any pain at this time. Pt denies experiencing any SI/HI, or AVH at this time.   A: Scheduled medications administered to pt, per MD orders. Support and encouragement provided. Frequent verbal contact made. Routine safety checks conducted q15 minutes.  R: No adverse drug reactions noted. Pt verbally contracts for safety at this time. Pt complaint with medications. Pt interacts minimally with others on the unit. Pt remains safe at this time. Will continue plan of care.

## 2022-10-29 DIAGNOSIS — F332 Major depressive disorder, recurrent severe without psychotic features: Secondary | ICD-10-CM | POA: Diagnosis not present

## 2022-10-29 LAB — GLUCOSE, CAPILLARY
Glucose-Capillary: 174 mg/dL — ABNORMAL HIGH (ref 70–99)
Glucose-Capillary: 154 mg/dL — ABNORMAL HIGH (ref 70–99)
Glucose-Capillary: 151 mg/dL — ABNORMAL HIGH (ref 70–99)
Glucose-Capillary: 150 mg/dL — ABNORMAL HIGH (ref 70–99)

## 2022-10-29 NOTE — Progress Notes (Signed)
Patient is alert and oriented times 4. Mood and affect appropriate. Patient rates pain as 0/10. He denies SI, HI, and AVH. Patient does endorse feelings of anxiety and depression at this time. Patient states he slept well last night. Evening medicines administered whole by mouth without difficulty, and 2 units of insulin subq.  Patient refused his snack and Ensure, choosing to stay in his room instead. Patient remains on unit with Q15 minute checks in place.

## 2022-10-29 NOTE — Plan of Care (Signed)
Pt denies anxiety/depression at this time. Pt denies SI/HI/AVH or pain at this time. Pt is calm and cooperative. Pt is medication compliant. Pt provided with support and encouragement. Pt monitored q15 minutes for safety per unit policy. Plan of care ongoing.   Problem: Education: Goal: Knowledge of General Education information will improve Description: Including pain rating scale, medication(s)/side effects and non-pharmacologic comfort measures Outcome: Progressing   Problem: Coping: Goal: Level of anxiety will decrease Outcome: Progressing

## 2022-10-29 NOTE — Progress Notes (Signed)
[facility] MD Progress Note  10/29/2022 11:55 AM Dillon [name]  MRN:  [id number] Subjective: Dillon [name] is seen on rounds.  I started him on trazodone last night and he states that he slept much better.  He is pleasant and cooperative and compliant with his medication.  He denies any side effects from his medicine.  Overall he is improving. Principal Problem: Severe recurrent major depression without psychotic features ([place]) Diagnosis: Principal Problem:   Severe recurrent major depression without psychotic features ([place])  Total Time spent with patient: 15 minutes  Past Psychiatric History:  ECT back in 2018. He has been on Luvox in the past. I believe he was supposed to follow-up at [facility] at that time   Past Medical History:  Past Medical History:  Diagnosis Date   Kidney stone    Meningitis    spinal   OCD (obsessive compulsive disorder)    Shingles     Past Surgical History:  Procedure Laterality Date   CYSTOSCOPY W/ URETERAL STENT PLACEMENT Right 10/12/2022   Procedure: CYSTOSCOPY WITH RETROGRADE PYELOGRAM/URETERAL STENT PLACEMENT;  Surgeon: Abbie Sons, MD;  Location: [facility] ORS;  Service: Urology;  Laterality: Right;   KIDNEY STONE SURGERY     Family History:  Family History  Problem Relation Age of Onset   Cancer Mother        bladder   Emphysema Father    Emphysema Sister    COPD Sister    Cancer Brother        prostate    Social History:  Social History   Substance and Sexual Activity  Alcohol Use No   Alcohol/week: 0.0 standard drinks of alcohol     Social History   Substance and Sexual Activity  Drug Use No    Social History   Socioeconomic History   Marital status: Widowed    Spouse name: Not on file   Number of children: Not on file   Years of education: Not on file   Highest education level: Not on file  Occupational History   Occupation: retired  Tobacco Use   Smoking status: Never   Smokeless tobacco: Never  Vaping Use   Vaping Use: Never  used  Substance and Sexual Activity   Alcohol use: No    Alcohol/week: 0.0 standard drinks of alcohol   Drug use: No   Sexual activity: Not Currently  Other Topics Concern   Not on file  Social History Narrative   Not on file   Social Determinants of Health   Financial Resource Strain: Low Risk  (02/10/2022)   Overall Financial Resource Strain (CARDIA)    Difficulty of Paying Living Expenses: Not hard at all  Food Insecurity: No Food Insecurity (10/19/2022)   Hunger Vital Sign    Worried About Running Out of Food in the Last Year: Never true    [place] in the Last Year: Never true  Transportation Needs: No Transportation Needs (10/19/2022)   PRAPARE - Hydrologist (Medical): No    Lack of Transportation (Non-Medical): No  Physical Activity: Inactive (02/10/2022)   Exercise Vital Sign    Days of Exercise per Week: 0 days    Minutes of Exercise per Session: 0 min  Stress: No Stress Concern Present (02/10/2022)   [place]    Feeling of Stress : Not at all  Social Connections: Moderately Integrated (02/10/2022)   Social Connection and Isolation Panel [NHANES]  Frequency of Communication with Friends and Family: Twice a week    Frequency of Social Gatherings with Friends and Family: Twice a week    Attends Religious Services: 1 to 4 times per year    Active Member of [employer] or Organizations: No    Attends Engineer, structural: More than 4 times per year    Marital Status: Widowed   Additional Social History:                         Sleep: Good  Appetite:  Good  Current Medications: Current Facility-Administered Medications  Medication Dose Route Frequency Provider Last Rate Last Admin   acetaminophen (TYLENOL) tablet 650 mg  650 mg Oral Q6H PRN Clapacs, John T, MD       alum & mag hydroxide-simeth (MAALOX/MYLANTA) 200-200-20 MG/5ML suspension 30 mL  30 mL  Oral Q4H PRN Clapacs, John T, MD       cyanocobalamin (VITAMIN B12) tablet 1,000 mcg  1,000 mcg Oral Daily Clapacs, John T, MD   1,000 mcg at 10/29/22 0913   docusate sodium (COLACE) capsule 100 mg  100 mg Oral BID Clapacs, John T, MD   100 mg at 10/29/22 0912   feeding supplement (ENSURE ENLIVE / ENSURE PLUS) liquid 237 mL  237 mL Oral TID BM Clapacs, John T, MD   237 mL at 10/29/22 0913   fentaNYL (SUBLIMAZE) injection 25 mcg  25 mcg Intravenous Q5 min PRN Darleene Cleaver, Gijsbertus F, MD       fluvoxaMINE (LUVOX) tablet 100 mg  100 mg Oral [id number] Leahanna Buser Edward, DO   100 mg at 10/29/22 0802   insulin aspart (novoLOG) injection 0-9 Units  0-9 Units Subcutaneous TID AC & HS Clapacs, Jackquline Denmark, MD   2 Units at 10/29/22 1139   magnesium hydroxide (MILK OF MAGNESIA) suspension 30 mL  30 mL Oral Daily PRN Clapacs, John T, MD   30 mL at 10/22/22 1222   multivitamin with minerals tablet 1 tablet  1 tablet Oral Daily Clapacs, Jackquline Denmark, MD   1 tablet at 10/29/22 0913   OLANZapine (ZYPREXA) tablet 10 mg  10 mg Oral QHS Sarina Ill, DO   10 mg at 10/28/22 2120   ondansetron (ZOFRAN) injection 4 mg  4 mg Intravenous Once PRN Darleene Cleaver, Gerrit Heck, MD       oxybutynin (DITROPAN) tablet 5 mg  5 mg Oral Q8H PRN Clapacs, John T, MD       polyethylene glycol (MIRALAX / GLYCOLAX) packet 17 g  17 g Oral Daily Clapacs, John T, MD   17 g at 10/29/22 0913   rosuvastatin (CRESTOR) tablet 10 mg  10 mg Oral Daily Clapacs, Jackquline Denmark, MD   10 mg at 10/29/22 0913   senna-docusate (Senokot-S) tablet 1 tablet  1 tablet Oral BID Clapacs, Jackquline Denmark, MD   1 tablet at 10/29/22 0912   tamsulosin (FLOMAX) capsule 0.4 mg  0.4 mg Oral Daily Clapacs, John T, MD   0.4 mg at 10/29/22 0913   traZODone (DESYREL) tablet 50 mg  50 mg Oral QHS Sarina Ill, DO   50 mg at 10/28/22 2120   Vitamin D (Ergocalciferol) (DRISDOL) 1.25 MG (50000 UNIT) capsule 50,000 Units  50,000 Units Oral Q7 days Clapacs, Jackquline Denmark, MD    50,000 Units at 10/28/22 3149    Lab Results:  Results for orders placed or performed during the hospital encounter of 10/19/22 (from  the past 48 hour(s))  Glucose, capillary     Status: Abnormal   Collection Time: 10/27/22  4:26 PM  Result Value Ref Range   Glucose-Capillary 202 (H) 70 - 99 mg/dL    Comment: Glucose reference range applies only to samples taken after fasting for at least 8 hours.  Glucose, capillary     Status: Abnormal   Collection Time: 10/27/22  9:20 PM  Result Value Ref Range   Glucose-Capillary 160 (H) 70 - 99 mg/dL    Comment: Glucose reference range applies only to samples taken after fasting for at least 8 hours.  Glucose, capillary     Status: Abnormal   Collection Time: 10/28/22  8:01 AM  Result Value Ref Range   Glucose-Capillary 177 (H) 70 - 99 mg/dL    Comment: Glucose reference range applies only to samples taken after fasting for at least 8 hours.  Glucose, capillary     Status: Abnormal   Collection Time: 10/28/22 11:35 AM  Result Value Ref Range   Glucose-Capillary 193 (H) 70 - 99 mg/dL    Comment: Glucose reference range applies only to samples taken after fasting for at least 8 hours.  Glucose, capillary     Status: Abnormal   Collection Time: 10/28/22  4:34 PM  Result Value Ref Range   Glucose-Capillary 230 (H) 70 - 99 mg/dL    Comment: Glucose reference range applies only to samples taken after fasting for at least 8 hours.  Glucose, capillary     Status: Abnormal   Collection Time: 10/28/22  7:41 PM  Result Value Ref Range   Glucose-Capillary 182 (H) 70 - 99 mg/dL    Comment: Glucose reference range applies only to samples taken after fasting for at least 8 hours.  Glucose, capillary     Status: Abnormal   Collection Time: 10/29/22  7:36 AM  Result Value Ref Range   Glucose-Capillary 174 (H) 70 - 99 mg/dL    Comment: Glucose reference range applies only to samples taken after fasting for at least 8 hours.  Glucose, capillary     Status:  Abnormal   Collection Time: 10/29/22 11:32 AM  Result Value Ref Range   Glucose-Capillary 154 (H) 70 - 99 mg/dL    Comment: Glucose reference range applies only to samples taken after fasting for at least 8 hours.    Blood Alcohol level:  Lab Results  Component Value Date   ETH <5 01/04/2016   ETH <5 12/15/2015    Metabolic Disorder Labs: Lab Results  Component Value Date   HGBA1C 6.3 (H) 10/11/2022   MPG 134 10/11/2022   MPG 91 03/27/2017   Lab Results  Component Value Date   PROLACTIN 29.7 (H) 01/05/2016   Lab Results  Component Value Date   CHOL 144 10/11/2022   TRIG 148 10/11/2022   HDL 44 10/11/2022   CHOLHDL 4.6 03/27/2017   VLDL 23 03/27/2017   LDLCALC 74 10/11/2022   LDLCALC 45 04/08/2022    Physical Findings: AIMS:  , ,  ,  ,    CIWA:    COWS:     Musculoskeletal: Strength & Muscle Tone: within normal limits Gait & Station: normal Patient leans: N/A  Psychiatric Specialty Exam:  Presentation  General Appearance:  Appropriate for Environment  Eye Contact: Good  Speech: Clear and Coherent  Speech Volume: Normal  Handedness: Right   Mood and Affect  Mood: Anxious; Depressed  Affect: Congruent   Thought Process  Thought Processes: Linear  Descriptions of Associations:Intact  Orientation:Full (Time, Place and Person)  Thought Content:Paranoid Ideation ("I will have diarrhea if  I eat or drink")  History of Schizophrenia/Schizoaffective disorder:No  Duration of Psychotic Symptoms:No data recorded Hallucinations:No data recorded Ideas of Reference:Paranoia  Suicidal Thoughts:No data recorded Homicidal Thoughts:No data recorded  Sensorium  Memory: Immediate Good  Judgment: Poor  Insight: Fair   Community education officer  Concentration: Fair  Attention Span: Good  Recall: [employer] of Knowledge: Fair  Language: Fair   Psychomotor Activity  Psychomotor Activity:No data recorded  Assets   Assets: Desire for Improvement; Financial Resources/Insurance; Housing   Sleep  Sleep:No data recorded    Blood pressure 102/81, pulse (!) 107, temperature (!) 97.5 F (36.4 C), temperature source Oral, resp. rate 18, height 5\' 9"  (1.753 m), weight 85.5 kg, SpO2 99 %. Body mass index is 27.84 kg/m.   Treatment Plan Summary: Daily contact with patient to assess and evaluate symptoms and progress in treatment, Medication management, and Plan continue current medications.  [place]ks Ranger, DO 10/29/2022, 11:55 AM

## 2022-10-30 DIAGNOSIS — F332 Major depressive disorder, recurrent severe without psychotic features: Secondary | ICD-10-CM | POA: Diagnosis not present

## 2022-10-30 LAB — GLUCOSE, CAPILLARY
Glucose-Capillary: 177 mg/dL — ABNORMAL HIGH (ref 70–99)
Glucose-Capillary: 171 mg/dL — ABNORMAL HIGH (ref 70–99)
Glucose-Capillary: 232 mg/dL — ABNORMAL HIGH (ref 70–99)
Glucose-Capillary: 173 mg/dL — ABNORMAL HIGH (ref 70–99)

## 2022-10-30 NOTE — Progress Notes (Signed)
[facility] MD Progress Note  10/30/2022 11:58 AM Dillon [name]  MRN:  [id number] Subjective: Dillon [name] is seen on rounds.  He says he slept good.  His appetite is improving.  His mood is improving.  He should be ready for discharge in the next day or 2.  Principal Problem: Severe recurrent major depression without psychotic features ([place]) Diagnosis: Principal Problem:   Severe recurrent major depression without psychotic features ([place])  Total Time spent with patient: 15 minutes  Past Psychiatric History:   ECT back in 2018. He has been on Luvox in the past. I believe he was supposed to follow-up at [facility] at that time    Past Medical History:  Past Medical History:  Diagnosis Date   Kidney stone    Meningitis    spinal   OCD (obsessive compulsive disorder)    Shingles     Past Surgical History:  Procedure Laterality Date   CYSTOSCOPY W/ URETERAL STENT PLACEMENT Right 10/12/2022   Procedure: CYSTOSCOPY WITH RETROGRADE PYELOGRAM/URETERAL STENT PLACEMENT;  Surgeon: Abbie Sons, MD;  Location: [facility] ORS;  Service: Urology;  Laterality: Right;   KIDNEY STONE SURGERY     Family History:  Family History  Problem Relation Age of Onset   Cancer Mother        bladder   Emphysema Father    Emphysema Sister    COPD Sister    Cancer Brother        prostate    Social History:  Social History   Substance and Sexual Activity  Alcohol Use No   Alcohol/week: 0.0 standard drinks of alcohol     Social History   Substance and Sexual Activity  Drug Use No    Social History   Socioeconomic History   Marital status: Widowed    Spouse name: Not on file   Number of children: Not on file   Years of education: Not on file   Highest education level: Not on file  Occupational History   Occupation: retired  Tobacco Use   Smoking status: Never   Smokeless tobacco: Never  Vaping Use   Vaping Use: Never used  Substance and Sexual Activity   Alcohol use: No    Alcohol/week: 0.0 standard  drinks of alcohol   Drug use: No   Sexual activity: Not Currently  Other Topics Concern   Not on file  Social History Narrative   Not on file   Social Determinants of Health   Financial Resource Strain: Low Risk  (02/10/2022)   Overall Financial Resource Strain (CARDIA)    Difficulty of Paying Living Expenses: Not hard at all  Food Insecurity: No Food Insecurity (10/19/2022)   Hunger Vital Sign    Worried About Running Out of Food in the Last Year: Never true    [place] in the Last Year: Never true  Transportation Needs: No Transportation Needs (10/19/2022)   PRAPARE - Hydrologist (Medical): No    Lack of Transportation (Non-Medical): No  Physical Activity: Inactive (02/10/2022)   Exercise Vital Sign    Days of Exercise per Week: 0 days    Minutes of Exercise per Session: 0 min  Stress: No Stress Concern Present (02/10/2022)   [place]    Feeling of Stress : Not at all  Social Connections: Moderately Integrated (02/10/2022)   Social Connection and Isolation Panel [NHANES]    Frequency of Communication with Friends and  Family: Twice a week    Frequency of Social Gatherings with Friends and Family: Twice a week    Attends Religious Services: 1 to 4 times per year    Active Member of [employer] or Organizations: No    Attends Music therapist: More than 4 times per year    Marital Status: Widowed   Additional Social History:                         Sleep: Good  Appetite:  Good  Current Medications: Current Facility-Administered Medications  Medication Dose Route Frequency Provider Last Rate Last Admin   acetaminophen (TYLENOL) tablet 650 mg  650 mg Oral Q6H PRN Clapacs, John T, MD       alum & mag hydroxide-simeth (MAALOX/MYLANTA) 200-200-20 MG/5ML suspension 30 mL  30 mL Oral Q4H PRN Clapacs, John T, MD       cyanocobalamin (VITAMIN B12) tablet 1,000 mcg   1,000 mcg Oral Daily Clapacs, John T, MD   1,000 mcg at 10/30/22 0958   docusate sodium (COLACE) capsule 100 mg  100 mg Oral BID Clapacs, John T, MD   100 mg at 10/30/22 0958   feeding supplement (ENSURE ENLIVE / ENSURE PLUS) liquid 237 mL  237 mL Oral TID BM Clapacs, John T, MD   237 mL at 10/30/22 0959   fentaNYL (SUBLIMAZE) injection 25 mcg  25 mcg Intravenous Q5 min PRN Boston Service, Gijsbertus F, MD       fluvoxaMINE (LUVOX) tablet 100 mg  100 mg Oral [id number] Parks Ranger, DO   100 mg at 10/30/22 2725   insulin aspart (novoLOG) injection 0-9 Units  0-9 Units Subcutaneous TID AC & HS Clapacs, Madie Reno, MD   2 Units at 10/30/22 0959   magnesium hydroxide (MILK OF MAGNESIA) suspension 30 mL  30 mL Oral Daily PRN Clapacs, John T, MD   30 mL at 10/22/22 1222   multivitamin with minerals tablet 1 tablet  1 tablet Oral Daily Clapacs, Madie Reno, MD   1 tablet at 10/30/22 1004   OLANZapine (ZYPREXA) tablet 10 mg  10 mg Oral QHS Parks Ranger, DO   10 mg at 10/29/22 2110   ondansetron (ZOFRAN) injection 4 mg  4 mg Intravenous Once PRN Boston Service, Jane Canary, MD       oxybutynin (DITROPAN) tablet 5 mg  5 mg Oral Q8H PRN Clapacs, John T, MD       polyethylene glycol (MIRALAX / GLYCOLAX) packet 17 g  17 g Oral Daily Clapacs, John T, MD   17 g at 10/30/22 0959   rosuvastatin (CRESTOR) tablet 10 mg  10 mg Oral Daily Clapacs, Madie Reno, MD   10 mg at 10/30/22 3664   senna-docusate (Senokot-S) tablet 1 tablet  1 tablet Oral BID Clapacs, Madie Reno, MD   1 tablet at 10/30/22 0959   tamsulosin (FLOMAX) capsule 0.4 mg  0.4 mg Oral Daily Clapacs, John T, MD   0.4 mg at 10/30/22 0958   traZODone (DESYREL) tablet 50 mg  50 mg Oral QHS Parks Ranger, DO   50 mg at 10/29/22 2110   Vitamin D (Ergocalciferol) (DRISDOL) 1.25 MG (50000 UNIT) capsule 50,000 Units  50,000 Units Oral Q7 days Clapacs, Madie Reno, MD   50,000 Units at 10/28/22 4034    Lab Results:  Results for orders placed or performed  during the hospital encounter of 10/19/22 (from the past 48 hour(s))  Glucose,  capillary     Status: Abnormal   Collection Time: 10/28/22  4:34 PM  Result Value Ref Range   Glucose-Capillary 230 (H) 70 - 99 mg/dL    Comment: Glucose reference range applies only to samples taken after fasting for at least 8 hours.  Glucose, capillary     Status: Abnormal   Collection Time: 10/28/22  7:41 PM  Result Value Ref Range   Glucose-Capillary 182 (H) 70 - 99 mg/dL    Comment: Glucose reference range applies only to samples taken after fasting for at least 8 hours.  Glucose, capillary     Status: Abnormal   Collection Time: 10/29/22  7:36 AM  Result Value Ref Range   Glucose-Capillary 174 (H) 70 - 99 mg/dL    Comment: Glucose reference range applies only to samples taken after fasting for at least 8 hours.  Glucose, capillary     Status: Abnormal   Collection Time: 10/29/22 11:32 AM  Result Value Ref Range   Glucose-Capillary 154 (H) 70 - 99 mg/dL    Comment: Glucose reference range applies only to samples taken after fasting for at least 8 hours.  Glucose, capillary     Status: Abnormal   Collection Time: 10/29/22  4:20 PM  Result Value Ref Range   Glucose-Capillary 151 (H) 70 - 99 mg/dL    Comment: Glucose reference range applies only to samples taken after fasting for at least 8 hours.   Comment 1 Notify RN   Glucose, capillary     Status: Abnormal   Collection Time: 10/29/22  8:13 PM  Result Value Ref Range   Glucose-Capillary 150 (H) 70 - 99 mg/dL    Comment: Glucose reference range applies only to samples taken after fasting for at least 8 hours.  Glucose, capillary     Status: Abnormal   Collection Time: 10/30/22  7:40 AM  Result Value Ref Range   Glucose-Capillary 177 (H) 70 - 99 mg/dL    Comment: Glucose reference range applies only to samples taken after fasting for at least 8 hours.  Glucose, capillary     Status: Abnormal   Collection Time: 10/30/22 11:20 AM  Result Value Ref  Range   Glucose-Capillary 171 (H) 70 - 99 mg/dL    Comment: Glucose reference range applies only to samples taken after fasting for at least 8 hours.    Blood Alcohol level:  Lab Results  Component Value Date   ETH <5 01/04/2016   ETH <5 12/15/2015    Metabolic Disorder Labs: Lab Results  Component Value Date   HGBA1C 6.3 (H) 10/11/2022   MPG 134 10/11/2022   MPG 91 03/27/2017   Lab Results  Component Value Date   PROLACTIN 29.7 (H) 01/05/2016   Lab Results  Component Value Date   CHOL 144 10/11/2022   TRIG 148 10/11/2022   HDL 44 10/11/2022   CHOLHDL 4.6 03/27/2017   VLDL 23 03/27/2017   LDLCALC 74 10/11/2022   LDLCALC 45 04/08/2022    Physical Findings: AIMS:  , ,  ,  ,    CIWA:    COWS:     Musculoskeletal: Strength & Muscle Tone: within normal limits Gait & Station: normal Patient leans: N/A  Psychiatric Specialty Exam:  Presentation  General Appearance:  Appropriate for Environment  Eye Contact: Good  Speech: Clear and Coherent  Speech Volume: Normal  Handedness: Right   Mood and Affect  Mood: Anxious; Depressed  Affect: Congruent   Thought Process  Thought Processes:  Linear  Descriptions of Associations:Intact  Orientation:Full (Time, Place and Person)  Thought Content:Paranoid Ideation ("I will have diarrhea if  I eat or drink")  History of Schizophrenia/Schizoaffective disorder:No  Duration of Psychotic Symptoms:No data recorded Hallucinations:No data recorded Ideas of Reference:Paranoia  Suicidal Thoughts:No data recorded Homicidal Thoughts:No data recorded  Sensorium  Memory: Immediate Good  Judgment: Poor  Insight: Fair   Community education officer  Concentration: Fair  Attention Span: Good  Recall: [employer] of Knowledge: Fair  Language: Fair   Psychomotor Activity  Psychomotor Activity:No data recorded  Assets  Assets: Desire for Improvement; Financial Resources/Insurance;  Housing   Sleep  Sleep:No data recorded    Blood pressure 104/78, pulse 87, temperature 97.8 F (36.6 C), temperature source Oral, resp. rate 18, height 5\' 9"  (1.753 m), weight 85.5 kg, SpO2 100 %. Body mass index is 27.84 kg/m.   Treatment Plan Summary: Daily contact with patient to assess and evaluate symptoms and progress in treatment, Medication management, and Plan continue current medications.  Parks Ranger, DO 10/30/2022, 11:58 AM

## 2022-10-30 NOTE — Progress Notes (Signed)
The patient has been cooperative with treatment on shift, he denies SI, HI & AVH.  Patient still reports feelings of anxiety and depression. No new behavioral issues to report on shift at this time.

## 2022-10-30 NOTE — Progress Notes (Signed)
Patient is alert and oriented times 4. Mood and affect appropriate. Patient rates pain as 0/10. He denies SI, HI, and AVH. Patient does endorse feelings of anxiety and depression at this time. Patient states he slept well last night. Evening medicines administered whole by mouth without difficulty, and 1 unit of insulin subq. Patient refused snack but did drink his Ensure in his room. Patient remains on unit with Q15 minute checks in place.

## 2022-10-31 LAB — GLUCOSE, CAPILLARY
Glucose-Capillary: 173 mg/dL — ABNORMAL HIGH (ref 70–99)
Glucose-Capillary: 137 mg/dL — ABNORMAL HIGH (ref 70–99)
Glucose-Capillary: 176 mg/dL — ABNORMAL HIGH (ref 70–99)
Glucose-Capillary: 153 mg/dL — ABNORMAL HIGH (ref 70–99)

## 2022-10-31 NOTE — BH IP Treatment Plan (Signed)
Interdisciplinary Treatment and Diagnostic Plan Update  10/31/2022 Time of Session: 9:00AM Dillon [name] MRN: [id number]  Principal Diagnosis: Severe recurrent major depression without psychotic features [facility])  Secondary Diagnoses: Principal Problem:   Severe recurrent major depression without psychotic features ([facility])   Current Medications:  Current Facility-Administered Medications  Medication Dose Route Frequency Provider Last Rate Last Admin   acetaminophen (TYLENOL) tablet 650 mg  650 mg Oral Q6H PRN Clapacs, Jackquline Denmark, MD       alum & mag hydroxide-simeth (MAALOX/MYLANTA) 200-200-20 MG/5ML suspension 30 mL  30 mL Oral Q4H PRN Clapacs, John T, MD       cyanocobalamin (VITAMIN B12) tablet 1,000 mcg  1,000 mcg Oral Daily Clapacs, John T, MD   1,000 mcg at 10/31/22 0912   docusate sodium (COLACE) capsule 100 mg  100 mg Oral BID Clapacs, John T, MD   100 mg at 10/31/22 0912   feeding supplement (ENSURE ENLIVE / ENSURE PLUS) liquid 237 mL  237 mL Oral TID BM Clapacs, John T, MD   237 mL at 10/30/22 2004   fentaNYL (SUBLIMAZE) injection 25 mcg  25 mcg Intravenous Q5 min PRN Darleene Cleaver, Gijsbertus F, MD       fluvoxaMINE (LUVOX) tablet 100 mg  100 mg Oral [id number] Herrick, Richard Edward, DO   100 mg at 10/31/22 0911   insulin aspart (novoLOG) injection 0-9 Units  0-9 Units Subcutaneous TID AC & HS Clapacs, Jackquline Denmark, MD   2 Units at 10/31/22 0855   magnesium hydroxide (MILK OF MAGNESIA) suspension 30 mL  30 mL Oral Daily PRN Clapacs, John T, MD   30 mL at 10/22/22 1222   multivitamin with minerals tablet 1 tablet  1 tablet Oral Daily Clapacs, Jackquline Denmark, MD   1 tablet at 10/31/22 0912   OLANZapine (ZYPREXA) tablet 10 mg  10 mg Oral QHS Sarina Ill, DO   10 mg at 10/30/22 2115   ondansetron (ZOFRAN) injection 4 mg  4 mg Intravenous Once PRN Darleene Cleaver, Gerrit Heck, MD       oxybutynin (DITROPAN) tablet 5 mg  5 mg Oral Q8H PRN Clapacs, John T, MD       polyethylene glycol (MIRALAX  / GLYCOLAX) packet 17 g  17 g Oral Daily Clapacs, John T, MD   17 g at 10/31/22 0912   rosuvastatin (CRESTOR) tablet 10 mg  10 mg Oral Daily Clapacs, Jackquline Denmark, MD   10 mg at 10/31/22 0912   senna-docusate (Senokot-S) tablet 1 tablet  1 tablet Oral BID Clapacs, Jackquline Denmark, MD   1 tablet at 10/31/22 0912   tamsulosin (FLOMAX) capsule 0.4 mg  0.4 mg Oral Daily Clapacs, John T, MD   0.4 mg at 10/31/22 0912   traZODone (DESYREL) tablet 50 mg  50 mg Oral QHS Sarina Ill, DO   50 mg at 10/30/22 2115   Vitamin D (Ergocalciferol) (DRISDOL) 1.25 MG (50000 UNIT) capsule 50,000 Units  50,000 Units Oral Q7 days Clapacs, Jackquline Denmark, MD   50,000 Units at 10/28/22 0904   PTA Medications: Medications Prior to Admission  Medication Sig Dispense Refill Last Dose   cyanocobalamin (VITAMIN B12) 1000 MCG tablet Take 1 tablet (1,000 mcg total) by mouth daily. 90 tablet 4 10/18/2022   fluvoxaMINE (LUVOX) 100 MG tablet Take 1 tablet (100 mg total) by mouth at bedtime.   10/18/2022   glucose blood (ACCU-CHEK GUIDE) test strip Use to check blood sugars 2-3 times daily with goals = <130  fasting in morning and <180 two hours after eating. Bring blood sugar log to appointments. 100 each 12 10/18/2022   lisinopril (ZESTRIL) 5 MG tablet Take 1 tablet (5 mg total) by mouth daily. 90 tablet 4 10/18/2022   OLANZapine (ZYPREXA) 5 MG tablet Take 5 mg by mouth daily.   10/18/2022   oxybutynin (DITROPAN) 5 MG tablet Take 1 tablet (5 mg total) by mouth every 8 (eight) hours as needed for bladder spasms (frequency,urgency).   10/18/2022   rosuvastatin (CRESTOR) 10 MG tablet Take 1 tablet (10 mg total) by mouth daily. 90 tablet 4 10/18/2022   sitaGLIPtin (JANUVIA) 100 MG tablet Take 1 tablet (100 mg total) by mouth daily. 30 tablet 3 10/18/2022   tamsulosin (FLOMAX) 0.4 MG CAPS capsule Take 1 capsule (0.4 mg total) by mouth daily. 30 capsule  10/18/2022   Vitamin D, Ergocalciferol, (DRISDOL) 1.25 MG (50000 UNIT) CAPS capsule Take 1 capsule (50,000 Units  total) by mouth every 7 (seven) days. 12 capsule 0 10/18/2022    Patient Stressors:    Patient Strengths:    Treatment Modalities: Medication Management, Group therapy, Case management,  1 to 1 session with clinician, Psychoeducation, Recreational therapy.   Physician Treatment Plan for Primary Diagnosis: Severe recurrent major depression without psychotic features ([place]) Long Term Goal(s): Improvement in symptoms so as ready for discharge   Short Term Goals: Ability to identify changes in lifestyle to reduce recurrence of condition will improve Ability to verbalize feelings will improve Ability to disclose and discuss suicidal ideas Ability to demonstrate self-control will improve Ability to identify and develop effective coping behaviors will improve Ability to maintain clinical measurements within normal limits will improve Compliance with prescribed medications will improve Ability to identify triggers associated with substance abuse/mental health issues will improve  Medication Management: Evaluate patient's response, side effects, and tolerance of medication regimen.  Therapeutic Interventions: 1 to 1 sessions, Unit Group sessions and Medication administration.  Evaluation of Outcomes: Adequate for Discharge  Physician Treatment Plan for Secondary Diagnosis: Principal Problem:   Severe recurrent major depression without psychotic features ([place])  Long Term Goal(s): Improvement in symptoms so as ready for discharge   Short Term Goals: Ability to identify changes in lifestyle to reduce recurrence of condition will improve Ability to verbalize feelings will improve Ability to disclose and discuss suicidal ideas Ability to demonstrate self-control will improve Ability to identify and develop effective coping behaviors will improve Ability to maintain clinical measurements within normal limits will improve Compliance with prescribed medications will improve Ability to identify  triggers associated with substance abuse/mental health issues will improve     Medication Management: Evaluate patient's response, side effects, and tolerance of medication regimen.  Therapeutic Interventions: 1 to 1 sessions, Unit Group sessions and Medication administration.  Evaluation of Outcomes: Adequate for Discharge   RN Treatment Plan for Primary Diagnosis: Severe recurrent major depression without psychotic features ([place]) Long Term Goal(s): Knowledge of disease and therapeutic regimen to maintain health will improve  Short Term Goals: Ability to remain free from injury will improve, Ability to verbalize frustration and anger appropriately will improve, Ability to demonstrate self-control, Ability to participate in decision making will improve, Ability to verbalize feelings will improve, Ability to identify and develop effective coping behaviors will improve, and Compliance with prescribed medications will improve  Medication Management: RN will administer medications as ordered by provider, will assess and evaluate patient's response and provide education to patient for prescribed medication. RN will report any adverse and/or side  effects to prescribing provider.  Therapeutic Interventions: 1 on 1 counseling sessions, Psychoeducation, Medication administration, Evaluate responses to treatment, Monitor vital signs and CBGs as ordered, Perform/monitor CIWA, COWS, AIMS and Fall Risk screenings as ordered, Perform wound care treatments as ordered.  Evaluation of Outcomes: Adequate for Discharge   LCSW Treatment Plan for Primary Diagnosis: Severe recurrent major depression without psychotic features ([place]) Long Term Goal(s): Safe transition to appropriate next level of care at discharge, Engage patient in therapeutic group addressing interpersonal concerns.  Short Term Goals: Engage patient in aftercare planning with referrals and resources, Increase social support, Increase ability to  appropriately verbalize feelings, Increase emotional regulation, Facilitate acceptance of mental health diagnosis and concerns, and Increase skills for wellness and recovery  Therapeutic Interventions: Assess for all discharge needs, 1 to 1 time with Social worker, Explore available resources and support systems, Assess for adequacy in community support network, Educate family and significant other(s) on suicide prevention, Complete Psychosocial Assessment, Interpersonal group therapy.  Evaluation of Outcomes: Adequate for Discharge   Progress in Treatment: Attending groups: Yes. Participating in groups: Yes. Taking medication as prescribed: Yes. Toleration medication: Yes. Family/Significant other contact made: Yes, individual(s) contacted:  SPE completed with Clent Ridges' pt's daughter Patient understands diagnosis: Yes. Discussing patient identified problems/goals with staff: Yes. Medical problems stabilized or resolved: Yes. Denies suicidal/homicidal ideation: Yes. Issues/concerns per patient self-inventory: No. Other: None  New problem(s) identified: No, Describe:  None  New Short Term/Long Term Goal(s): Patient to work towards medication management for mood stabilization; elimination of SI thoughts; development of comprehensive mental wellness plan. Update 10/26/22: No changes at this time. Update 10/31/22: No changes at this time.    Patient Goals:  Patient states their goal for treatment is to "get my eating up and get out." Update 10/26/22: No changes at this time. Update 10/31/22: No changes at this time.    Discharge Plan or Barriers: No psychosocial barriers identified at this time, patient to return to place of residence when appropriate for discharge. Update 10/26/22: No changes at this time. Update 10/31/22: No changes at this time.    Reason for Continuation of Hospitalization: Depression Medication stabilization   Estimated Length of Stay: 1-7 days  Last 3 [place]  Suicide Severity Risk Score: Flowsheet Row Admission (Current) from 10/19/2022 in [place] ED to Hosp-Admission (Discharged) from 10/11/2022 in [place] No Risk No Risk       Last PHQ 2/9 Scores:    10/11/2022   12:16 PM 04/08/2022    1:58 PM 02/10/2022   11:21 AM  Depression screen PHQ 2/9  Decreased Interest 3 0 0  Down, Depressed, Hopeless 2 0 0  PHQ - 2 Score 5 0 0  Altered sleeping 2 2 0  Tired, decreased energy 2 2 0  Change in appetite 3 0 0  Feeling bad or failure about yourself  2 0 0  Trouble concentrating 1 0 0  Moving slowly or fidgety/restless 1 0 0  Suicidal thoughts 0 0 0  PHQ-9 Score 16 4 0  Difficult doing work/chores Very difficult Not difficult at all Not difficult at all    Scribe for Treatment Team: Irwin Toran A [place], [place] 10/31/2022 9:57 AM

## 2022-10-31 NOTE — Progress Notes (Signed)
Patient denies SI, HI, and AVH. He rates his depression and anxiety as a 3/10. Patient denies pain or other physical issues.He is compliant with scheduled medications. Patient is isolative to his room this shift and did not come out for snack. Support and encouragement provided.

## 2022-10-31 NOTE — Progress Notes (Signed)
Patient is alert and oriented times 4. Mood and affect appropriate. Patient rates pain as 0/10. He denies SI, HI, and AVH. Patient does endorse feelings of anxiety and depression at this time. Patient states he slept well last night. Evening medicines administered whole by mouth without difficulty. Patient refused snack in day room, but did have his Ensure in his room instead. Patient remains on unit with Q15 minute checks in place.

## 2022-10-31 NOTE — BHH Counselor (Signed)
CSW contacted pt's daughter, Hendryx Ricke, [phone]  to inform her of pt's anticipated discharge date of Wednesday.  She stated that she was very excited and happy to hear her father was doing better.   She stated that she would be able to assist pt with transportation upon discharge.   Jequan Shahin [place], MSW, LCSW-A 1/15/202411:14 AM

## 2022-10-31 NOTE — Plan of Care (Signed)
  Problem: Education: Goal: Knowledge of General Education information will improve Description: Including pain rating scale, medication(s)/side effects and non-pharmacologic comfort measures Outcome: Progressing   Problem: Health Behavior/Discharge Planning: Goal: Ability to manage health-related needs will improve Outcome: Progressing   Problem: Clinical Measurements: Goal: Ability to maintain clinical measurements within normal limits will improve Outcome: Progressing   Problem: Activity: Goal: Risk for activity intolerance will decrease Outcome: Progressing   Problem: Nutrition: Goal: Adequate nutrition will be maintained Outcome: Progressing   Problem: Coping: Goal: Coping ability will improve Outcome: Progressing

## 2022-10-31 NOTE — Progress Notes (Signed)
[facility] MD Progress Note  10/31/2022 1:18 PM Dillon [name]  MRN:  [id number] Subjective: Dillon [name] is seen on rounds.  Tells me that he is sleeping better and his appetite is improved.  He has been taking his medications as prescribed and denies any side effects.  We talked about discharge in the next day or 2.  We need to get a hold of his daughter or son.  He also needs follow-up appointments.  Last time he had ECT followed up with [facility] but did not continue and he has no provider right now. Principal Problem: Severe recurrent major depression without psychotic features ([place]) Diagnosis: Principal Problem:   Severe recurrent major depression without psychotic features ([place])  Total Time spent with patient: 15 minutes  Past Psychiatric History:  ECT back in 2018. He has been on Luvox in the past. I believe he was supposed to follow-up at [facility] at that time    Past Medical History:  Past Medical History:  Diagnosis Date   Kidney stone    Meningitis    spinal   OCD (obsessive compulsive disorder)    Shingles     Past Surgical History:  Procedure Laterality Date   CYSTOSCOPY W/ URETERAL STENT PLACEMENT Right 10/12/2022   Procedure: CYSTOSCOPY WITH RETROGRADE PYELOGRAM/URETERAL STENT PLACEMENT;  Surgeon: Abbie Sons, MD;  Location: [facility] ORS;  Service: Urology;  Laterality: Right;   KIDNEY STONE SURGERY     Family History:  Family History  Problem Relation Age of Onset   Cancer Mother        bladder   Emphysema Father    Emphysema Sister    COPD Sister    Cancer Brother        prostate    Social History:  Social History   Substance and Sexual Activity  Alcohol Use No   Alcohol/week: 0.0 standard drinks of alcohol     Social History   Substance and Sexual Activity  Drug Use No    Social History   Socioeconomic History   Marital status: Widowed    Spouse name: Not on file   Number of children: Not on file   Years of education: Not on file   Highest education level:  Not on file  Occupational History   Occupation: retired  Tobacco Use   Smoking status: Never   Smokeless tobacco: Never  Vaping Use   Vaping Use: Never used  Substance and Sexual Activity   Alcohol use: No    Alcohol/week: 0.0 standard drinks of alcohol   Drug use: No   Sexual activity: Not Currently  Other Topics Concern   Not on file  Social History Narrative   Not on file   Social Determinants of Health   Financial Resource Strain: Low Risk  (02/10/2022)   Overall Financial Resource Strain (CARDIA)    Difficulty of Paying Living Expenses: Not hard at all  Food Insecurity: No Food Insecurity (10/19/2022)   Hunger Vital Sign    Worried About Running Out of Food in the Last Year: Never true    [place] in the Last Year: Never true  Transportation Needs: No Transportation Needs (10/19/2022)   PRAPARE - Hydrologist (Medical): No    Lack of Transportation (Non-Medical): No  Physical Activity: Inactive (02/10/2022)   Exercise Vital Sign    Days of Exercise per Week: 0 days    Minutes of Exercise per Session: 0 min  Stress: No Stress Concern  Present (02/10/2022)   [place]    Feeling of Stress : Not at all  Social Connections: Moderately Integrated (02/10/2022)   Social Connection and Isolation Panel [NHANES]    Frequency of Communication with Friends and Family: Twice a week    Frequency of Social Gatherings with Friends and Family: Twice a week    Attends Religious Services: 1 to 4 times per year    Active Member of [employer] or Organizations: No    Attends Music therapist: More than 4 times per year    Marital Status: Widowed   Additional Social History:                         Sleep: Good  Appetite:  Good  Current Medications: Current Facility-Administered Medications  Medication Dose Route Frequency Provider Last Rate Last Admin   acetaminophen  (TYLENOL) tablet 650 mg  650 mg Oral Q6H PRN Clapacs, John T, MD       alum & mag hydroxide-simeth (MAALOX/MYLANTA) 200-200-20 MG/5ML suspension 30 mL  30 mL Oral Q4H PRN Clapacs, John T, MD       cyanocobalamin (VITAMIN B12) tablet 1,000 mcg  1,000 mcg Oral Daily Clapacs, John T, MD   1,000 mcg at 10/31/22 0912   docusate sodium (COLACE) capsule 100 mg  100 mg Oral BID Clapacs, John T, MD   100 mg at 10/31/22 0912   feeding supplement (ENSURE ENLIVE / ENSURE PLUS) liquid 237 mL  237 mL Oral TID BM Clapacs, John T, MD   237 mL at 10/30/22 2004   fentaNYL (SUBLIMAZE) injection 25 mcg  25 mcg Intravenous Q5 min PRN Boston Service, Gijsbertus F, MD       fluvoxaMINE (LUVOX) tablet 100 mg  100 mg Oral [id number] Hagen Tidd Edward, DO   100 mg at 10/31/22 6720   insulin aspart (novoLOG) injection 0-9 Units  0-9 Units Subcutaneous TID AC & HS Clapacs, John T, MD   1 Units at 10/31/22 1213   magnesium hydroxide (MILK OF MAGNESIA) suspension 30 mL  30 mL Oral Daily PRN Clapacs, John T, MD   30 mL at 10/22/22 1222   multivitamin with minerals tablet 1 tablet  1 tablet Oral Daily Clapacs, Madie Reno, MD   1 tablet at 10/31/22 0912   OLANZapine (ZYPREXA) tablet 10 mg  10 mg Oral QHS Parks Ranger, DO   10 mg at 10/30/22 2115   ondansetron (ZOFRAN) injection 4 mg  4 mg Intravenous Once PRN [place] Service, Jane Canary, MD       oxybutynin (DITROPAN) tablet 5 mg  5 mg Oral Q8H PRN Clapacs, John T, MD       polyethylene glycol (MIRALAX / GLYCOLAX) packet 17 g  17 g Oral Daily Clapacs, John T, MD   17 g at 10/31/22 0912   rosuvastatin (CRESTOR) tablet 10 mg  10 mg Oral Daily Clapacs, Madie Reno, MD   10 mg at 10/31/22 0912   senna-docusate (Senokot-S) tablet 1 tablet  1 tablet Oral BID Clapacs, Madie Reno, MD   1 tablet at 10/31/22 0912   tamsulosin (FLOMAX) capsule 0.4 mg  0.4 mg Oral Daily Clapacs, John T, MD   0.4 mg at 10/31/22 0912   traZODone (DESYREL) tablet 50 mg  50 mg Oral QHS Parks Ranger, DO    50 mg at 10/30/22 2115   Vitamin D (Ergocalciferol) (DRISDOL) 1.25 MG (50000  UNIT) capsule 50,000 Units  50,000 Units Oral Q7 days Clapacs, Jackquline Denmark, MD   50,000 Units at 10/28/22 1607    Lab Results:  Results for orders placed or performed during the hospital encounter of 10/19/22 (from the past 48 hour(s))  Glucose, capillary     Status: Abnormal   Collection Time: 10/29/22  4:20 PM  Result Value Ref Range   Glucose-Capillary 151 (H) 70 - 99 mg/dL    Comment: Glucose reference range applies only to samples taken after fasting for at least 8 hours.   Comment 1 Notify RN   Glucose, capillary     Status: Abnormal   Collection Time: 10/29/22  8:13 PM  Result Value Ref Range   Glucose-Capillary 150 (H) 70 - 99 mg/dL    Comment: Glucose reference range applies only to samples taken after fasting for at least 8 hours.  Glucose, capillary     Status: Abnormal   Collection Time: 10/30/22  7:40 AM  Result Value Ref Range   Glucose-Capillary 177 (H) 70 - 99 mg/dL    Comment: Glucose reference range applies only to samples taken after fasting for at least 8 hours.  Glucose, capillary     Status: Abnormal   Collection Time: 10/30/22 11:20 AM  Result Value Ref Range   Glucose-Capillary 171 (H) 70 - 99 mg/dL    Comment: Glucose reference range applies only to samples taken after fasting for at least 8 hours.  Glucose, capillary     Status: Abnormal   Collection Time: 10/30/22  4:16 PM  Result Value Ref Range   Glucose-Capillary 232 (H) 70 - 99 mg/dL    Comment: Glucose reference range applies only to samples taken after fasting for at least 8 hours.  Glucose, capillary     Status: Abnormal   Collection Time: 10/30/22  7:58 PM  Result Value Ref Range   Glucose-Capillary 173 (H) 70 - 99 mg/dL    Comment: Glucose reference range applies only to samples taken after fasting for at least 8 hours.  Glucose, capillary     Status: Abnormal   Collection Time: 10/31/22  7:53 AM  Result Value Ref Range    Glucose-Capillary 173 (H) 70 - 99 mg/dL    Comment: Glucose reference range applies only to samples taken after fasting for at least 8 hours.  Glucose, capillary     Status: Abnormal   Collection Time: 10/31/22 11:43 AM  Result Value Ref Range   Glucose-Capillary 137 (H) 70 - 99 mg/dL    Comment: Glucose reference range applies only to samples taken after fasting for at least 8 hours.    Blood Alcohol level:  Lab Results  Component Value Date   ETH <5 01/04/2016   ETH <5 12/15/2015    Metabolic Disorder Labs: Lab Results  Component Value Date   HGBA1C 6.3 (H) 10/11/2022   MPG 134 10/11/2022   MPG 91 03/27/2017   Lab Results  Component Value Date   PROLACTIN 29.7 (H) 01/05/2016   Lab Results  Component Value Date   CHOL 144 10/11/2022   TRIG 148 10/11/2022   HDL 44 10/11/2022   CHOLHDL 4.6 03/27/2017   VLDL 23 03/27/2017   LDLCALC 74 10/11/2022   LDLCALC 45 04/08/2022    Physical Findings: AIMS:  , ,  ,  ,    CIWA:    COWS:     Musculoskeletal: Strength & Muscle Tone: within normal limits Gait & Station: normal Patient leans: N/A  Psychiatric  Specialty Exam:  Presentation  General Appearance:  Appropriate for Environment  Eye Contact: Good  Speech: Clear and Coherent  Speech Volume: Normal  Handedness: Right   Mood and Affect  Mood: Anxious; Depressed  Affect: Congruent   Thought Process  Thought Processes: Linear  Descriptions of Associations:Intact  Orientation:Full (Time, Place and Person)  Thought Content:Paranoid Ideation ("I will have diarrhea if  I eat or drink")  History of Schizophrenia/Schizoaffective disorder:No  Duration of Psychotic Symptoms:No data recorded Hallucinations:No data recorded Ideas of Reference:Paranoia  Suicidal Thoughts:No data recorded Homicidal Thoughts:No data recorded  Sensorium  Memory: Immediate Good  Judgment: Poor  Insight: Fair   Art therapist   Concentration: Fair  Attention Span: Good  Recall: [employer] of Knowledge: Fair  Language: Fair   Psychomotor Activity  Psychomotor Activity:No data recorded  Assets  Assets: Desire for Improvement; Financial Resources/Insurance; Housing   Sleep  Sleep:No data recorded   Physical Exam: Physical Exam Vitals and nursing note reviewed.  Constitutional:      Appearance: Normal appearance. He is normal weight.  Neurological:     General: No focal deficit present.     Mental Status: He is alert and oriented to person, place, and time.  Psychiatric:        Attention and Perception: Attention and perception normal.        Mood and Affect: Mood is depressed. Affect is flat.        Speech: Speech normal.        Behavior: Behavior normal. Behavior is cooperative.        Thought Content: Thought content normal.        Cognition and Memory: Cognition and memory normal.        Judgment: Judgment normal.    Review of Systems  Psychiatric/Behavioral:  Positive for depression.    Blood pressure 128/79, pulse 92, temperature 97.9 F (36.6 C), resp. rate 20, height 5\' 9"  (1.753 m), weight 85.5 kg, SpO2 99 %. Body mass index is 27.84 kg/m.   Treatment Plan Summary: Daily contact with patient to assess and evaluate symptoms and progress in treatment, Medication management, and Plan continue current medications.  , DO 10/31/2022, 1:18 PM

## 2022-10-31 NOTE — Group Note (Signed)
[facility] LCSW Group Therapy Note   Group Date: 10/31/2022 Start Time: 1345 End Time: 1415   Type of Therapy/Topic:  Group Therapy:  Emotion Regulation  Participation Level:  Did Not Attend   Mood:  Description of Group:    The purpose of this group is to assist patients in learning to regulate negative emotions and experience positive emotions. Patients will be guided to discuss ways in which they have been vulnerable to their negative emotions. These vulnerabilities will be juxtaposed with experiences of positive emotions or situations, and patients challenged to use positive emotions to combat negative ones. Special emphasis will be placed on coping with negative emotions in conflict situations, and patients will process healthy conflict resolution skills.  Therapeutic Goals: Patient will identify two positive emotions or experiences to reflect on in order to balance out negative emotions:  Patient will label two or more emotions that they find the most difficult to experience:  Patient will be able to demonstrate positive conflict resolution skills through discussion or role plays:   Summary of Patient Progress:   X    Therapeutic Modalities:   Cognitive Behavioral Therapy Feelings Identification Dialectical Behavioral Therapy   Rashae Rother A Rielle Schlauch, LCSWA 

## 2022-10-31 NOTE — Progress Notes (Signed)
Patient with flat affect.  Patient is pleasant and cooperative.  Patient reports he slept well.  Good appetite this morning. Endorses mild anxiety and depression. Denies SI/HI and AVH.  Denies pain.  Compliant with scheduled medications.  15 min checks in place.  Patient present in the milieu after breakfast.  Appropriate interaction with peers  and staff.

## 2022-11-01 ENCOUNTER — Telehealth: Payer: Self-pay

## 2022-11-01 LAB — GLUCOSE, CAPILLARY
Glucose-Capillary: 176 mg/dL — ABNORMAL HIGH (ref 70–99)
Glucose-Capillary: 144 mg/dL — ABNORMAL HIGH (ref 70–99)
Glucose-Capillary: 168 mg/dL — ABNORMAL HIGH (ref 70–99)
Glucose-Capillary: 125 mg/dL — ABNORMAL HIGH (ref 70–99)

## 2022-11-01 MED ORDER — SENNOSIDES-DOCUSATE SODIUM 8.6-50 MG PO TABS
2.0000 | ORAL_TABLET | Freq: Four times a day (QID) | ORAL | Status: DC | PRN
  Administered 2022-11-01 (×2): 2 via ORAL
  Filled 2022-11-01 (×2): qty 2

## 2022-11-01 NOTE — Telephone Encounter (Signed)
Paperwork faxed back to [facility]

## 2022-11-01 NOTE — [facility] Group Notes (Signed)
Patient attended and participated in group. 

## 2022-11-01 NOTE — Group Note (Signed)
LCSW Group Therapy Note  Group Date: 11/01/2022 Start Time: 1500 End Time: 1535   Type of Therapy and Topic:  Group Therapy - Healthy vs Unhealthy Coping Skills  Participation Level:  Active   Description of Group The focus of this group was to determine what unhealthy coping techniques typically are used by group members and what healthy coping techniques would be helpful in coping with various problems. Patients were guided in becoming aware of the differences between healthy and unhealthy coping techniques. Patients were asked to identify 2-3 healthy coping skills they would like to learn to use more effectively.  Therapeutic Goals Patients learned that coping is what human beings do all day long to deal with various situations in their lives Patients defined and discussed healthy vs unhealthy coping techniques Patients identified their preferred coping techniques and identified whether these were healthy or unhealthy Patients determined 2-3 healthy coping skills they would like to become more familiar with and use more often. Patients provided support and ideas to each other   Summary of Patient Progress:  During group, he expressed normal mood and said that he felt negative watching the news and active practicing box breathing coping skills. He said he was "cared for all" and listening to "gospel music was nice as long as the person can sing." Patient proved open to input from peers and feedback from [place]. Patient demonstrated fair insight into the subject matter, was respectful of peers, and participated throughout the entire session.   Therapeutic Modalities Cognitive Behavioral Therapy Motivational Interviewing  Kelley Polinsky A [place], LCSWA 11/01/2022  3:57 PM

## 2022-11-01 NOTE — Progress Notes (Signed)
   11/01/22 1059  Group Notes  Time of group 1000  Type of therapy Other (Comment) (Stress management group)  Participation level Active  Participation quality Appropriate;Attentive;Sharing  Affect Appropriate  Cognitive Alert;Appropriate  Modes of intervention Activity;Education;Support  Summary of progress / Problems addressed Patients completed an activity worksheet by identifying their own triggers, thoughts, feelings, and actions to stress. Patients were educated on effective coping skills and shared one skill to implement when feeling stressed. Patient was able to identify "thinking too much ahead and into the future" as one of his main triggers for stress. Patient stated his reaction to stress is "feeling down." Patient identified his support system as his family and stated "trying to see the positive in things and listening to music" as some of his coping skills. Patient remained for the entire group session and remained engaged.

## 2022-11-01 NOTE — [facility] Group Notes (Signed)
Attended group 

## 2022-11-01 NOTE — Progress Notes (Signed)
Patient denies SI, HI, and AVH. He denies pain. Patient says he has still been having trouble having a bowel movement. Senokot given with scheduled medications. Patient was present in the dayroom for group and snack. He interacts minimally with others. Patient educated on discharge planning. Support and encouragement provided.

## 2022-11-01 NOTE — Progress Notes (Signed)
[facility] MD Progress Note  11/01/2022 1:10 PM Dillon [name]  MRN:  [id number] Subjective: Dillon [name] is seen on rounds.  He is sleeping better and his appetite is improved.  States that the medications have been helpful.  We talked about discharge tomorrow.  He denies any suicidal ideation. Principal Problem: Severe recurrent major depression without psychotic features ([place]) Diagnosis: Principal Problem:   Severe recurrent major depression without psychotic features ([place])  Total Time spent with patient: 15 minutes  Past Psychiatric History:  ECT back in 2018. He has been on Luvox in the past. I believe he was supposed to follow-up at [facility] at that time     Past Medical History:  Past Medical History:  Diagnosis Date   Kidney stone    Meningitis    spinal   OCD (obsessive compulsive disorder)    Shingles     Past Surgical History:  Procedure Laterality Date   CYSTOSCOPY W/ URETERAL STENT PLACEMENT Right 10/12/2022   Procedure: CYSTOSCOPY WITH RETROGRADE PYELOGRAM/URETERAL STENT PLACEMENT;  Surgeon: Abbie Sons, MD;  Location: [facility] ORS;  Service: Urology;  Laterality: Right;   KIDNEY STONE SURGERY     Family History:  Family History  Problem Relation Age of Onset   Cancer Mother        bladder   Emphysema Father    Emphysema Sister    COPD Sister    Cancer Brother        prostate    Social History:  Social History   Substance and Sexual Activity  Alcohol Use No   Alcohol/week: 0.0 standard drinks of alcohol     Social History   Substance and Sexual Activity  Drug Use No    Social History   Socioeconomic History   Marital status: Widowed    Spouse name: Not on file   Number of children: Not on file   Years of education: Not on file   Highest education level: Not on file  Occupational History   Occupation: retired  Tobacco Use   Smoking status: Never   Smokeless tobacco: Never  Vaping Use   Vaping Use: Never used  Substance and Sexual Activity   Alcohol  use: No    Alcohol/week: 0.0 standard drinks of alcohol   Drug use: No   Sexual activity: Not Currently  Other Topics Concern   Not on file  Social History Narrative   Not on file   Social Determinants of Health   Financial Resource Strain: Low Risk  (02/10/2022)   Overall Financial Resource Strain (CARDIA)    Difficulty of Paying Living Expenses: Not hard at all  Food Insecurity: No Food Insecurity (10/19/2022)   Hunger Vital Sign    Worried About Running Out of Food in the Last Year: Never true    [place] in the Last Year: Never true  Transportation Needs: No Transportation Needs (10/19/2022)   PRAPARE - Hydrologist (Medical): No    Lack of Transportation (Non-Medical): No  Physical Activity: Inactive (02/10/2022)   Exercise Vital Sign    Days of Exercise per Week: 0 days    Minutes of Exercise per Session: 0 min  Stress: No Stress Concern Present (02/10/2022)   [place]    Feeling of Stress : Not at all  Social Connections: Moderately Integrated (02/10/2022)   Social Connection and Isolation Panel [NHANES]    Frequency of Communication with Friends and  Family: Twice a week    Frequency of Social Gatherings with Friends and Family: Twice a week    Attends Religious Services: 1 to 4 times per year    Active Member of [employer] or Organizations: No    Attends Engineer, structural: More than 4 times per year    Marital Status: Widowed   Additional Social History:                         Sleep: Good  Appetite:  Good  Current Medications: Current Facility-Administered Medications  Medication Dose Route Frequency Provider Last Rate Last Admin   acetaminophen (TYLENOL) tablet 650 mg  650 mg Oral Q6H PRN Clapacs, John T, MD       alum & mag hydroxide-simeth (MAALOX/MYLANTA) 200-200-20 MG/5ML suspension 30 mL  30 mL Oral Q4H PRN Clapacs, John T, MD        cyanocobalamin (VITAMIN B12) tablet 1,000 mcg  1,000 mcg Oral Daily Clapacs, John T, MD   1,000 mcg at 11/01/22 0911   docusate sodium (COLACE) capsule 100 mg  100 mg Oral BID Clapacs, John T, MD   100 mg at 11/01/22 0911   feeding supplement (ENSURE ENLIVE / ENSURE PLUS) liquid 237 mL  237 mL Oral TID BM Clapacs, John T, MD   237 mL at 11/01/22 0911   fentaNYL (SUBLIMAZE) injection 25 mcg  25 mcg Intravenous Q5 min PRN Darleene Cleaver, Gijsbertus F, MD       fluvoxaMINE (LUVOX) tablet 100 mg  100 mg Oral [id number] Emara Lichter Edward, DO   100 mg at 11/01/22 0910   insulin aspart (novoLOG) injection 0-9 Units  0-9 Units Subcutaneous TID AC & HS Clapacs, Jackquline Denmark, MD   2 Units at 11/01/22 0910   magnesium hydroxide (MILK OF MAGNESIA) suspension 30 mL  30 mL Oral Daily PRN Clapacs, John T, MD   30 mL at 10/22/22 1222   multivitamin with minerals tablet 1 tablet  1 tablet Oral Daily Clapacs, Jackquline Denmark, MD   1 tablet at 11/01/22 0910   OLANZapine (ZYPREXA) tablet 10 mg  10 mg Oral QHS Sarina Ill, DO   10 mg at 10/31/22 2131   ondansetron (ZOFRAN) injection 4 mg  4 mg Intravenous Once PRN Darleene Cleaver, Gerrit Heck, MD       oxybutynin (DITROPAN) tablet 5 mg  5 mg Oral Q8H PRN Clapacs, John T, MD       polyethylene glycol (MIRALAX / GLYCOLAX) packet 17 g  17 g Oral Daily Clapacs, John T, MD   17 g at 11/01/22 0910   rosuvastatin (CRESTOR) tablet 10 mg  10 mg Oral Daily Clapacs, Jackquline Denmark, MD   10 mg at 11/01/22 0910   senna-docusate (Senokot-S) tablet 1 tablet  1 tablet Oral BID Clapacs, Jackquline Denmark, MD   1 tablet at 11/01/22 0911   tamsulosin (FLOMAX) capsule 0.4 mg  0.4 mg Oral Daily Clapacs, John T, MD   0.4 mg at 11/01/22 0911   traZODone (DESYREL) tablet 50 mg  50 mg Oral QHS Sarina Ill, DO   50 mg at 10/31/22 2131   Vitamin D (Ergocalciferol) (DRISDOL) 1.25 MG (50000 UNIT) capsule 50,000 Units  50,000 Units Oral Q7 days Clapacs, Jackquline Denmark, MD   50,000 Units at 10/28/22 0272    Lab  Results:  Results for orders placed or performed during the hospital encounter of 10/19/22 (from the past 48 hour(s))  Glucose,  capillary     Status: Abnormal   Collection Time: 10/30/22  4:16 PM  Result Value Ref Range   Glucose-Capillary 232 (H) 70 - 99 mg/dL    Comment: Glucose reference range applies only to samples taken after fasting for at least 8 hours.  Glucose, capillary     Status: Abnormal   Collection Time: 10/30/22  7:58 PM  Result Value Ref Range   Glucose-Capillary 173 (H) 70 - 99 mg/dL    Comment: Glucose reference range applies only to samples taken after fasting for at least 8 hours.  Glucose, capillary     Status: Abnormal   Collection Time: 10/31/22  7:53 AM  Result Value Ref Range   Glucose-Capillary 173 (H) 70 - 99 mg/dL    Comment: Glucose reference range applies only to samples taken after fasting for at least 8 hours.  Glucose, capillary     Status: Abnormal   Collection Time: 10/31/22 11:43 AM  Result Value Ref Range   Glucose-Capillary 137 (H) 70 - 99 mg/dL    Comment: Glucose reference range applies only to samples taken after fasting for at least 8 hours.  Glucose, capillary     Status: Abnormal   Collection Time: 10/31/22  4:20 PM  Result Value Ref Range   Glucose-Capillary 176 (H) 70 - 99 mg/dL    Comment: Glucose reference range applies only to samples taken after fasting for at least 8 hours.  Glucose, capillary     Status: Abnormal   Collection Time: 10/31/22  8:02 PM  Result Value Ref Range   Glucose-Capillary 153 (H) 70 - 99 mg/dL    Comment: Glucose reference range applies only to samples taken after fasting for at least 8 hours.   Comment 1 Notify RN   Glucose, capillary     Status: Abnormal   Collection Time: 11/01/22  7:58 AM  Result Value Ref Range   Glucose-Capillary 176 (H) 70 - 99 mg/dL    Comment: Glucose reference range applies only to samples taken after fasting for at least 8 hours.  Glucose, capillary     Status: Abnormal    Collection Time: 11/01/22 11:36 AM  Result Value Ref Range   Glucose-Capillary 144 (H) 70 - 99 mg/dL    Comment: Glucose reference range applies only to samples taken after fasting for at least 8 hours.   Comment 1 Notify RN     Blood Alcohol level:  Lab Results  Component Value Date   ETH <5 01/04/2016   ETH <5 20/94/7096    Metabolic Disorder Labs: Lab Results  Component Value Date   HGBA1C 6.3 (H) 10/11/2022   MPG 134 10/11/2022   MPG 91 03/27/2017   Lab Results  Component Value Date   PROLACTIN 29.7 (H) 01/05/2016   Lab Results  Component Value Date   CHOL 144 10/11/2022   TRIG 148 10/11/2022   HDL 44 10/11/2022   CHOLHDL 4.6 03/27/2017   VLDL 23 03/27/2017   LDLCALC 74 10/11/2022   LDLCALC 45 04/08/2022    Physical Findings: AIMS:  , ,  ,  ,    CIWA:    COWS:     Musculoskeletal: Strength & Muscle Tone: within normal limits Gait & Station: normal Patient leans: N/A  Psychiatric Specialty Exam:  Presentation  General Appearance:  Appropriate for Environment  Eye Contact: Good  Speech: Clear and Coherent  Speech Volume: Normal  Handedness: Right   Mood and Affect  Mood: Anxious; Depressed  Affect: Congruent  Thought Process  Thought Processes: Linear  Descriptions of Associations:Intact  Orientation:Full (Time, Place and Person)  Thought Content:Paranoid Ideation ("I will have diarrhea if  I eat or drink")  History of Schizophrenia/Schizoaffective disorder:No  Duration of Psychotic Symptoms:No data recorded Hallucinations:No data recorded Ideas of Reference:Paranoia  Suicidal Thoughts:No data recorded Homicidal Thoughts:No data recorded  Sensorium  Memory: Immediate Good  Judgment: Poor  Insight: Fair   Art therapist  Concentration: Fair  Attention Span: Good  Recall: [employer] of Knowledge: Fair  Language: Fair   Psychomotor Activity  Psychomotor Activity:No data recorded  Assets   Assets: Desire for Improvement; Financial Resources/Insurance; Housing   Sleep  Sleep:No data recorded   Physical Exam: Physical Exam Vitals and nursing note reviewed.  Constitutional:      Appearance: Normal appearance. He is normal weight.  Neurological:     General: No focal deficit present.     Mental Status: He is alert and oriented to person, place, and time.  Psychiatric:        Attention and Perception: Attention and perception normal.        Mood and Affect: Mood and affect normal.        Speech: Speech normal.        Behavior: Behavior normal. Behavior is cooperative.        Thought Content: Thought content normal.        Cognition and Memory: Cognition and memory normal.        Judgment: Judgment normal.    Review of Systems  Constitutional: Negative.   HENT: Negative.    Eyes: Negative.   Respiratory: Negative.    Cardiovascular: Negative.   Gastrointestinal: Negative.   Genitourinary: Negative.   Musculoskeletal: Negative.   Skin: Negative.   Neurological: Negative.   Endo/Heme/Allergies: Negative.   Psychiatric/Behavioral: Negative.     Blood pressure 104/80, pulse 98, temperature 97.9 F (36.6 C), resp. rate 18, height 5\' 9"  (1.753 m), weight 85.5 kg, SpO2 99 %. Body mass index is 27.84 kg/m.   Treatment Plan Summary: Daily contact with patient to assess and evaluate symptoms and progress in treatment, Medication management, and Plan continue current medications.  Discharge tomorrow.  Vanisha Whiten , DO 11/01/2022, 1:10 PM

## 2022-11-01 NOTE — Progress Notes (Signed)
Patient with flat affect.  Patient is pleasant and cooperative. Endorses minimal anxiety. Denies feelings of depression.  Denies SI/HI and AVH.  Denies pain.  Complains of constipation, having only a small BM yesterday.  New order given. Patient is expected to discharge tomorrow. "I'm as ready as I can be."  Complaint with CBGs and scheduled medications.  15 min checks in place.  Patient is safe on the unit.  Patient present in the milieu.  Appropriate interaction with peers.

## 2022-11-01 NOTE — Plan of Care (Signed)

## 2022-11-02 ENCOUNTER — Ambulatory Visit: Payer: Medicare HMO | Admitting: Urology

## 2022-11-02 ENCOUNTER — Encounter: Payer: Self-pay | Admitting: Urology

## 2022-11-02 LAB — GLUCOSE, CAPILLARY
Glucose-Capillary: 161 mg/dL — ABNORMAL HIGH (ref 70–99)
Glucose-Capillary: 162 mg/dL — ABNORMAL HIGH (ref 70–99)

## 2022-11-02 MED ORDER — OLANZAPINE 10 MG PO TABS: 10.0000 mg | ORAL_TABLET | Freq: Every day | ORAL | 3 refills | Status: DC

## 2022-11-02 MED ORDER — FLUVOXAMINE MALEATE 100 MG PO TABS: 100.0000 mg | ORAL_TABLET | ORAL | 3 refills | Status: DC

## 2022-11-02 MED ORDER — TRAZODONE HCL 50 MG PO TABS: 50.0000 mg | ORAL_TABLET | Freq: Every day | ORAL | 3 refills | Status: DC

## 2022-11-02 NOTE — Progress Notes (Signed)
Patient ID: Dillon [name], male   DOB: 1951/01/07, 72 y.o.   MRN: [id number]  Patient was discharged from [facility] unit at approx 1345 escorted by staff. Patient denies SI/HI/AVH. Discharge packet to include printed AVS, Suicide Risk Assessment, and Transition Record reviewed with patient and reviewed with daughter over the telephone. Belongings to include his 'special' hat returned and patient verified receipt with signature. Suicide safety plan completed with a copy kept in chart.

## 2022-11-02 NOTE — Care Management Important Message (Signed)
Important Message  Patient Details  Name: Dillon [name] MRN: [id number] Date of Birth: 18-Nov-1950   Medicare Important Message Given:  Yes     Ryllie Nieland A [place], [place] 11/02/2022, 9:22 AM

## 2022-11-02 NOTE — BHH Suicide Risk Assessment (Signed)
[facility] Discharge Suicide Risk Assessment   Principal Problem: Severe recurrent major depression without psychotic features [facility]) Discharge Diagnoses: Principal Problem:   Severe recurrent major depression without psychotic features ([place])   Total Time spent with patient: 1 hour  Musculoskeletal: Strength & Muscle Tone: within normal limits Gait & Station: normal Patient leans: N/A  Psychiatric Specialty Exam  Presentation  General Appearance:  Appropriate for Environment  Eye Contact: Good  Speech: Clear and Coherent  Speech Volume: Normal  Handedness: Right   Mood and Affect  Mood: Anxious; Depressed  Duration of Depression Symptoms: No data recorded Affect: Congruent   Thought Process  Thought Processes: Linear  Descriptions of Associations:Intact  Orientation:Full (Time, Place and Person)  Thought Content:Paranoid Ideation ("I will have diarrhea if  I eat or drink")  History of Schizophrenia/Schizoaffective disorder:No  Duration of Psychotic Symptoms:No data recorded Hallucinations:No data recorded Ideas of Reference:Paranoia  Suicidal Thoughts:No data recorded Homicidal Thoughts:No data recorded  Sensorium  Memory: Immediate Good  Judgment: Poor  Insight: Fair   Community education officer  Concentration: Fair  Attention Span: Good  Recall: [employer] of Knowledge: Fair  Language: Fair   Psychomotor Activity  Psychomotor Activity:No data recorded  Assets  Assets: Desire for Improvement; Financial Resources/Insurance; Housing   Sleep  Sleep:No data recorded  Physical Exam: Physical Exam ROS Blood pressure 121/82, pulse 84, temperature 97.6 F (36.4 C), temperature source Oral, resp. rate 15, height 5\' 9"  (1.753 m), weight 85.5 kg, SpO2 99 %. Body mass index is 27.84 kg/m.  Mental Status Per Nursing Assessment::   On Admission:  NA  Demographic Factors:  Male, Age 19 or older, Caucasian, and Living alone  Loss  Factors: NA  Historical Factors: NA  Risk Reduction Factors:   Positive social support and Positive coping skills or problem solving skills  Continued Clinical Symptoms:  Depression:   Anhedonia  Cognitive Features That Contribute To Risk:  None    Suicide Risk:  Minimal: No identifiable suicidal ideation.  Patients presenting with no risk factors but with morbid ruminations; may be classified as minimal risk based on the severity of the depressive symptoms   Follow-up [place] on 11/29/2022.   Specialty: Behavioral Health Why: Please present for scheduled appointment with Dr. Modesta Messing on 11/29/2022 at 830am, please arrive 30 minutes early to fill out paperwork. Contact information: [place] [phone]                Plan Of Care/Follow-up recommendations: [place], DO 11/02/2022, 10:54 AM

## 2022-11-02 NOTE — Discharge Summary (Signed)
Physician Discharge Summary Note  Patient:  Dillon [name] is an 72 y.o., male MRN:  [id number] DOB:  07/25/1951 Patient phone:  [phone] (home)  Patient address:   [place] [address],  Total Time spent with patient: 1 hour  Date of Admission:  10/19/2022 Date of Discharge: 11/02/2022  Reason for Admission:   Dillon [name] is a 72 year old white male who was voluntarily admitted to geriatric psychiatry for worsening depression.  He has not been eating.  States that he has been feeling down but denies any suicidal thoughts.  He is currently living alone.  His wife passed away 2015-02-14.  He received ECT from Dr. Weber [name] back in 2017-02-13 and states that it helps.  Dr. Weber [name] has restarted ECT yesterday.  He did not follow-up with any outpatient provider.Dr. Weber [name] has increased Luvox to 100 mg, DC'd Seroquel and started olanzapine 5 mg.  He was recently started on metformin by his PCP for prediabetes.  He was found to have a kidney stone on admission and urology placed a stent and he was transferred to psychiatry.  He denies any auditory or visual hallucinations.  He is retired from the [employer].  He has 1 son and 1 daughter that lives close by and are supportive.   Principal Problem: Severe recurrent major depression without psychotic features [facility]) Discharge Diagnoses: Principal Problem:   Severe recurrent major depression without psychotic features [facility])   Past Psychiatric History:   ECT back in 02/13/17. He has been on Luvox in the past. I believe he was supposed to follow-up at [facility] at that time      Past Medical History:  Past Medical History:  Diagnosis Date   Kidney stone    Meningitis    spinal   OCD (obsessive compulsive disorder)    Shingles     Past Surgical History:  Procedure Laterality Date   CYSTOSCOPY W/ URETERAL STENT PLACEMENT Right 10/12/2022   Procedure: CYSTOSCOPY WITH RETROGRADE PYELOGRAM/URETERAL STENT PLACEMENT;  Surgeon: Abbie Sons, MD;   Location: [facility] ORS;  Service: Urology;  Laterality: Right;   KIDNEY STONE SURGERY     Family History:  Family History  Problem Relation Age of Onset   Cancer Mother        bladder   Emphysema Father    Emphysema Sister    COPD Sister    Cancer Brother        prostate    Social History:  Social History   Substance and Sexual Activity  Alcohol Use No   Alcohol/week: 0.0 standard drinks of alcohol     Social History   Substance and Sexual Activity  Drug Use No    Social History   Socioeconomic History   Marital status: Widowed    Spouse name: Not on file   Number of children: Not on file   Years of education: Not on file   Highest education level: Not on file  Occupational History   Occupation: retired  Tobacco Use   Smoking status: Never   Smokeless tobacco: Never  Vaping Use   Vaping Use: Never used  Substance and Sexual Activity   Alcohol use: No    Alcohol/week: 0.0 standard drinks of alcohol   Drug use: No   Sexual activity: Not Currently  Other Topics Concern   Not on file  Social History Narrative   Not on file   Social Determinants of Health   Financial Resource Strain: Low Risk  (02/10/2022)  Overall Financial Resource Strain (CARDIA)    Difficulty of Paying Living Expenses: Not hard at all  Food Insecurity: No Food Insecurity (10/19/2022)   Hunger Vital Sign    Worried About Running Out of Food in the Last Year: Never true    Ran Out of Food in the Last Year: Never true  Transportation Needs: No Transportation Needs (10/19/2022)   PRAPARE - Administrator, Civil Service (Medical): No    Lack of Transportation (Non-Medical): No  Physical Activity: Inactive (02/10/2022)   Exercise Vital Sign    Days of Exercise per Week: 0 days    Minutes of Exercise per Session: 0 min  Stress: No Stress Concern Present (02/10/2022)   [name] of Occupational Health - Occupational Stress Questionnaire    Feeling of Stress : Not at all  Social  Connections: Moderately Integrated (02/10/2022)   Social Connection and Isolation Panel [NHANES]    Frequency of Communication with Friends and Family: Twice a week    Frequency of Social Gatherings with Friends and Family: Twice a week    Attends Religious Services: 1 to 4 times per year    Active Member of [employer] or Organizations: No    Attends Engineer, structural: More than 4 times per year    Marital Status: Widowed    Hospital Course: [name] is a 72 year old white male with a long history of depression who had not been taking his medications.  He had received ECT in the past which was helpful along with antidepressants.  He was seen in the emergency room and admitted for hydronephrosis with a stent placement and then transferred to psychiatry.  ECT was initiated and he received 2 treatments with success.  Luvox was titrated up to 100 mg twice a day.  Zyprexa was added for sleep, mood, and appetite.  Trazodone was used for sleep.  Initially, he was isolative to his room but then started coming out and participating.  He was compliant with medications and denied any side effects.  He was pleasant and cooperative.  It was felt that he maximized hospitalization and he was discharged home.  On the day of discharge she denied suicidal ideation, homicidal ideation, auditory or visual hallucinations.  Judgment and insight were good.  Physical Findings: AIMS:  , ,  ,  ,    CIWA:    COWS:     Musculoskeletal: Strength & Muscle Tone: within normal limits Gait & Station: normal Patient leans: N/A   Psychiatric Specialty Exam:  Presentation  General Appearance:  Appropriate for Environment  Eye Contact: Good  Speech: Clear and Coherent  Speech Volume: Normal  Handedness: Right   Mood and Affect  Mood: Anxious; Depressed  Affect: Congruent   Thought Process  Thought Processes: Linear  Descriptions of Associations:Intact  Orientation:Full (Time, Place and  Person)  Thought Content:Paranoid Ideation ("I will have diarrhea if  I eat or drink")  History of Schizophrenia/Schizoaffective disorder:No  Duration of Psychotic Symptoms:No data recorded Hallucinations:No data recorded Ideas of Reference:Paranoia  Suicidal Thoughts:No data recorded Homicidal Thoughts:No data recorded  Sensorium  Memory: Immediate Good  Judgment: Poor  Insight: Fair   Art therapist  Concentration: Fair  Attention Span: Good  Recall: [employer] of Knowledge: Fair  Language: Fair   Psychomotor Activity  Psychomotor Activity:No data recorded  Assets  Assets: Desire for Improvement; Financial Resources/Insurance; Housing   Sleep  Sleep:No data recorded   Physical Exam: Physical Exam Vitals and nursing note  reviewed.  Constitutional:      Appearance: Normal appearance. He is normal weight.  Neurological:     General: No focal deficit present.     Mental Status: He is alert and oriented to person, place, and time.  Psychiatric:        Attention and Perception: Attention and perception normal.        Mood and Affect: Mood and affect normal.        Speech: Speech normal.        Behavior: Behavior normal. Behavior is cooperative.        Thought Content: Thought content normal.        Cognition and Memory: Cognition and memory normal.        Judgment: Judgment normal.    Review of Systems  Constitutional: Negative.   HENT: Negative.    Eyes: Negative.   Respiratory: Negative.    Cardiovascular: Negative.   Gastrointestinal: Negative.   Genitourinary: Negative.   Musculoskeletal: Negative.   Skin: Negative.   Neurological: Negative.   Endo/Heme/Allergies: Negative.   Psychiatric/Behavioral: Negative.     Blood pressure 121/82, pulse 84, temperature 97.6 F (36.4 C), temperature source Oral, resp. rate 15, height 5\' 9"  (1.753 m), weight 85.5 kg, SpO2 99 %. Body mass index is 27.84 kg/m.   Social History   Tobacco  Use  Smoking Status Never  Smokeless Tobacco Never   Tobacco Cessation:  N/A, patient does not currently use tobacco products   Blood Alcohol level:  Lab Results  Component Value Date   ETH <5 01/04/2016   ETH <5 40/34/7425    Metabolic Disorder Labs:  Lab Results  Component Value Date   HGBA1C 6.3 (H) 10/11/2022   MPG 134 10/11/2022   MPG 91 03/27/2017   Lab Results  Component Value Date   PROLACTIN 29.7 (H) 01/05/2016   Lab Results  Component Value Date   CHOL 144 10/11/2022   TRIG 148 10/11/2022   HDL 44 10/11/2022   CHOLHDL 4.6 03/27/2017   VLDL 23 03/27/2017   LDLCALC 74 10/11/2022   LDLCALC 45 04/08/2022    See Psychiatric Specialty Exam and Suicide Risk Assessment completed by Attending Physician prior to discharge.  Discharge destination:  Home  Is patient on multiple antipsychotic therapies at discharge:  No   Has Patient had three or more failed trials of antipsychotic monotherapy by history:  No  Recommended Plan for Multiple Antipsychotic Therapies: NA   Allergies as of 11/02/2022       Reactions   Codeine Other (See Comments)   Unsure of reaction        Medication List     STOP taking these medications    lisinopril 5 MG tablet Commonly known as: ZESTRIL       TAKE these medications      Indication  Accu-Chek Guide test strip Generic drug: glucose blood Use to check blood sugars 2-3 times daily with goals = <130 fasting in morning and <180 two hours after eating. Bring blood sugar log to appointments.    cyanocobalamin 1000 MCG tablet Commonly known as: VITAMIN B12 Take 1 tablet (1,000 mcg total) by mouth daily.    fluvoxaMINE 100 MG tablet Commonly known as: LUVOX Take 1 tablet (100 mg total) by mouth 2 (two) times daily at 8 am and 4 pm. What changed: when to take this  Indication: Major Depressive Disorder   OLANZapine 10 MG tablet Commonly known as: ZYPREXA Take 1 tablet (10 mg total)  by mouth at bedtime. What  changed:  medication strength how much to take when to take this  Indication: Anorexia Nervosa, Major Depressive Disorder   oxybutynin 5 MG tablet Commonly known as: DITROPAN Take 1 tablet (5 mg total) by mouth every 8 (eight) hours as needed for bladder spasms (frequency,urgency).    rosuvastatin 10 MG tablet Commonly known as: Crestor Take 1 tablet (10 mg total) by mouth daily.    sitaGLIPtin 100 MG tablet Commonly known as: Januvia Take 1 tablet (100 mg total) by mouth daily.    tamsulosin 0.4 MG Caps capsule Commonly known as: FLOMAX Take 1 capsule (0.4 mg total) by mouth daily.    traZODone 50 MG tablet Commonly known as: DESYREL Take 1 tablet (50 mg total) by mouth at bedtime.  Indication: Trouble Sleeping, Major Depressive Disorder   Vitamin D (Ergocalciferol) 1.25 MG (50000 UNIT) Caps capsule Commonly known as: DRISDOL Take 1 capsule (50,000 Units total) by mouth every 7 (seven) days.         Follow-up Information     [facility]. Go on 11/29/2022.   Specialty: Behavioral Health Why: Please present for scheduled appointment with Dr. Vanetta Shawl on 11/29/2022 at 830am, please arrive 30 minutes early to fill out paperwork. Contact information: [address] Medical [facility] [place] [address] [phone]                Follow-up recommendations:  [facility]    Signed: Sarina Ill, DO 11/02/2022, 11:01 AM

## 2022-11-02 NOTE — Group Note (Signed)
[facility] LCSW Group Therapy Note   Group Date: 11/02/2022 Start Time: 1517 End Time: 1330  Type of Therapy and Topic:  Group Therapy:  Feelings around Relapse and Recovery  Participation Level:  Active   Mood:  Description of Group:    Patients in this group will discuss emotions they experience before and after a relapse. They will process how experiencing these feelings, or avoidance of experiencing them, relates to having a relapse. Facilitator will guide patients to explore emotions they have related to recovery. Patients will be encouraged to process which emotions are more powerful. They will be guided to discuss the emotional reaction significant others in their lives may have to patients' relapse or recovery. Patients will be assisted in exploring ways to respond to the emotions of others without this contributing to a relapse.  Therapeutic Goals: Patient will identify two or more emotions that lead to relapse for them:  Patient will identify two emotions that result when they relapse:  Patient will identify two emotions related to recovery:  Patient will demonstrate ability to communicate their needs through discussion and/or role plays.   Summary of Patient Progress:  Patient was present for the entirety of the group session. Patient was an active listener, had a normal mood and participated in the topic of discussion, provided helpful advice to others, and added nuance to topic of conversation.  CSW led patient in distress tolerance activity to facilitate recovery skills. He said that he enjoyed listening to soothing music and calling loves ones as a contributing activity for improving distressing.    Therapeutic Modalities:   Cognitive Behavioral Therapy Solution-Focused Therapy Assertiveness Training Relapse Prevention Therapy   Dillon [name], LCSWA

## 2022-11-02 NOTE — Progress Notes (Signed)
  [facility] Adult Case Management Discharge Plan :  Will you be returning to the same living situation after discharge:  Yes,  pt will be returning home At discharge, do you have transportation home?: Yes,  pt's daughter will be providing transportation Do you have the ability to pay for your medications: Yes,  pt has [facility]  Release of information consent forms completed and in the chart;  Patient's signature needed at discharge.  Patient to Follow up at:  Follow-up Information     [facility]. Go on 11/29/2022.   Specialty: Behavioral Health Why: Please present for scheduled appointment with Dr. Modesta Messing on 11/29/2022 at 830am, please arrive 30 minutes early to fill out paperwork. Contact information: [place] [phone]                Next level of care provider has access to [place] and Suicide Prevention discussed: Yes,  SPE completed with pt's daughter     Has patient been referred to the Quitline?: N/A patient is not a smoker  Patient has been referred for addiction treatment: N/A  Sheamus Hasting A [place], [place] 11/02/2022, 9:23 AM

## 2022-11-03 ENCOUNTER — Telehealth: Payer: Self-pay | Admitting: *Deleted

## 2022-11-03 ENCOUNTER — Telehealth: Payer: Self-pay

## 2022-11-03 DIAGNOSIS — F325 Major depressive disorder, single episode, in full remission: Secondary | ICD-10-CM

## 2022-11-03 NOTE — Patient Outreach (Signed)
  Care Coordination [facility] Note Transition Care Management Follow-up Telephone Call Date of discharge and from where: [facility] [phone] [facility] How have you been since you were released from the hospital? Doing ok Any questions or concerns? No  Items Reviewed: Did the pt receive and understand the discharge instructions provided? Yes  Medications obtained and verified? Yes  Other? No  Any new allergies since your discharge? No  Dietary orders reviewed? No Do you have support at home? Yes   Home Care and Equipment/Supplies: Were home health services ordered? no If so, what is the name of the agency? N/a  Has the agency set up a time to come to the patient's home? not applicable Were any new equipment or medical supplies ordered?  No What is the name of the medical supply agency? N/a Were you able to get the supplies/equipment? not applicable Do you have any questions related to the use of the equipment or supplies? No  Functional Questionnaire: (I = Independent and D = Dependent) ADLs: I  Bathing/Dressing- I  Meal Prep- I  Eating- I  Maintaining continence- I  Transferring/Ambulation- I  Managing Meds- I  Follow up appointments reviewed:  PCP Hospital f/u appt confirmed? No   Specialist Hospital f/u appt confirmed? Yes  Scheduled to see Dr Modesta Messing [phone] 8:30 Are transportation arrangements needed? No  If their condition worsens, is the pt aware to call PCP or go to the Emergency Dept.? Yes Was the patient provided with contact information for the PCP's office or ED? Yes Was to pt encouraged to call back with questions or concerns? Yes  SDOH assessments and interventions completed:   Yes  SDOH Screenings   Food Insecurity: No Food Insecurity (11/03/2022)  Housing: Low Risk  (11/03/2022)  Transportation Needs: No Transportation Needs (11/03/2022)  Utilities: Not At Risk (10/19/2022)  Alcohol Screen: Low Risk  (10/19/2022)  Depression (PHQ2-9): High Risk (10/11/2022)  Financial  Resource Strain: Low Risk  (02/10/2022)  Physical Activity: Inactive (02/10/2022)  Social Connections: Moderately Integrated (02/10/2022)  Stress: No Stress Concern Present (02/10/2022)  Tobacco Use: Low Risk  (10/26/2022)     Care Coordination Interventions:  RN referred for [facility] meal Plan    Encounter Outcome:  Pt. Visit Completed    [place] Management [phone]

## 2022-11-03 NOTE — Telephone Encounter (Signed)
   Telephone encounter was:  Unsuccessful.  11/03/2022 Name: Dillon [name] MRN: [id number] DOB: 1951-10-12  Unsuccessful outbound call made today to assist with:   [facility] Meals  Outreach Attempt:  1st Attempt  Unable to [place], [place] Management  [phone] [address], [place], [place] [address] Phone: [phone] Email: [email or web address]

## 2022-11-04 ENCOUNTER — Telehealth: Payer: Self-pay

## 2022-11-04 NOTE — Telephone Encounter (Signed)
   Telephone encounter was:  Successful.  11/04/2022 Name: Dillon [name] MRN: [id number] DOB: 23-May-1951  Dillon [name] is a 72 y.o. year old male who is a primary care patient of Cannady, Barbaraann Faster, NP . The community resource team was consulted for assistance with [facility] guide performed the following interventions: Patient provided with information about care guide support team and interviewed to confirm resource needs.Patient stated he didnt want the [facility] Meals at this time   Follow Up Plan:  No further follow up planned at this time. The patient has been provided with needed resources.    [place], Care Management  [phone] [address], [place], [place] [address] Phone: [phone] Email: [email or web address]

## 2022-11-25 ENCOUNTER — Encounter: Payer: Self-pay | Admitting: Urology

## 2022-11-25 ENCOUNTER — Ambulatory Visit (INDEPENDENT_AMBULATORY_CARE_PROVIDER_SITE_OTHER): Payer: Medicare HMO | Admitting: Urology

## 2022-11-25 VITALS — BP 148/74 | HR 78 | Ht 69.0 in | Wt 206.0 lb

## 2022-11-25 DIAGNOSIS — N200 Calculus of kidney: Secondary | ICD-10-CM | POA: Diagnosis not present

## 2022-11-25 DIAGNOSIS — N1330 Unspecified hydronephrosis: Secondary | ICD-10-CM | POA: Diagnosis not present

## 2022-11-25 DIAGNOSIS — N261 Atrophy of kidney (terminal): Secondary | ICD-10-CM

## 2022-11-25 DIAGNOSIS — N132 Hydronephrosis with renal and ureteral calculous obstruction: Secondary | ICD-10-CM

## 2022-11-25 NOTE — H&P (View-Only) (Signed)
11/25/2022 12:01 PM   Dillon [name] 20-Feb-1951 [id number]  Referring provider: Venita Lick, NP [address] [place],  [place] [address]  Chief Complaint  Patient presents with   Follow-up    HPI: 72 y.o. male presents for a follow-up visit.  He presents today with his daughter.  History of OCD and depression and presented to ED 10/12/2022 after PCP referred secondary to refusal to eat or drink for the fear of developing diarrhea As part of his evaluation was found to have pyuria and an elevated lactate.  History of dull right flank pain for several weeks.  CT abdomen pelvis showed a 3 cm partial staghorn calculus obstructing the UPJ with severe right hydronephrosis and parenchymal thinning.  Prior history of stone disease in 2017. Underwent cystoscopy with stent placement 10/12/2022 and was transferred to psychiatry 10/19/2022 and discharged 11/02/2022 He presents today for discussion of stone management Overall doing well with mild-moderate stent symptoms   PMH: Past Medical History:  Diagnosis Date   Kidney stone    Meningitis    spinal   OCD (obsessive compulsive disorder)    Shingles     Surgical History: Past Surgical History:  Procedure Laterality Date   CYSTOSCOPY W/ URETERAL STENT PLACEMENT Right 10/12/2022   Procedure: CYSTOSCOPY WITH RETROGRADE PYELOGRAM/URETERAL STENT PLACEMENT;  Surgeon: Abbie Sons, MD;  Location: [facility] ORS;  Service: Urology;  Laterality: Right;   KIDNEY STONE SURGERY      Home Medications:  Allergies as of 11/25/2022       Reactions   Codeine Other (See Comments)   Unsure of reaction        Medication List        Accurate as of November 25, 2022 11:59 PM. If you have any questions, ask your nurse or doctor.          Accu-Chek Guide test strip Generic drug: glucose blood Use to check blood sugars 2-3 times daily with goals = <130 fasting in morning and <180 two hours after eating. Bring blood sugar log to  appointments.   cyanocobalamin 1000 MCG tablet Commonly known as: VITAMIN B12 Take 1 tablet (1,000 mcg total) by mouth daily.   fluvoxaMINE 100 MG tablet Commonly known as: LUVOX Take 1 tablet (100 mg total) by mouth 2 (two) times daily at 8 am and 4 pm.   OLANZapine 10 MG tablet Commonly known as: ZYPREXA Take 1 tablet (10 mg total) by mouth at bedtime.   oxybutynin 5 MG tablet Commonly known as: DITROPAN Take 1 tablet (5 mg total) by mouth every 8 (eight) hours as needed for bladder spasms (frequency,urgency).   rosuvastatin 10 MG tablet Commonly known as: Crestor Take 1 tablet (10 mg total) by mouth daily.   sitaGLIPtin 100 MG tablet Commonly known as: Januvia Take 1 tablet (100 mg total) by mouth daily.   tamsulosin 0.4 MG Caps capsule Commonly known as: FLOMAX Take 1 capsule (0.4 mg total) by mouth daily.   traZODone 50 MG tablet Commonly known as: DESYREL Take 1 tablet (50 mg total) by mouth at bedtime.   Vitamin D (Ergocalciferol) 1.25 MG (50000 UNIT) Caps capsule Commonly known as: DRISDOL Take 1 capsule (50,000 Units total) by mouth every 7 (seven) days.        Allergies:  Allergies  Allergen Reactions   Codeine Other (See Comments)    Unsure of reaction    Family History: Family History  Problem Relation Age of Onset   Cancer Mother  bladder   Emphysema Father    Emphysema Sister    COPD Sister    Cancer Brother        prostate    Social History:  reports that he has never smoked. He has never used smokeless tobacco. He reports that he does not drink alcohol and does not use drugs.   Physical Exam: BP (!) 148/74   Pulse 78   Ht '5\' 9"'$  (1.753 m)   Wt 206 lb (93.4 kg)   BMI 30.42 kg/m   Constitutional:  Alert, No acute distress. HEENT: [place] AT Respiratory: Normal respiratory effort, no increased work of breathing. Psychiatric: Normal mood and affect.  Laboratory Data:  Urinalysis Dipstick 3+ blood/3+ protein/1+  leukocyte Microscopy 6-10 WBC/>30 RBC   Assessment & Plan:    1.  Right nephrolithiasis 3 cm partial staghorn calculus with obstruction at the UPJ.  Significant parenchymal thinning indicating longstanding obstruction Schedule renal scan for differential function We discussed various treatment options for urolithiasis including observation with or without medical expulsive therapy, shockwave lithotripsy (SWL), ureteroscopy and laser lithotripsy with stent placement, and percutaneous nephrolithotomy. We discussed that management is based on stone size, location, density, patient co-morbidities, and patient preference.  Stones <51m in size have a >80% spontaneous passage rate. Data surrounding the use of tamsulosin for medical expulsive therapy is controversial, but meta analyses suggests it is most efficacious for distal stones between 5-156min size. Possible side effects include dizziness/lightheadedness, and retrograde ejaculation. SWL has a lower stone free rate in a single procedure, but also a lower complication rate compared to ureteroscopy and avoids a stent and associated stent related symptoms. Possible complications include renal hematoma, steinstrasse, and need for additional treatment.  Based on stone size SWL not recommended Ureteroscopy with laser lithotripsy with stent placement has a higher stone free rate than SWL in a single procedure, however increased complication rate including possible infection, ureteral injury, bleeding, and stent related morbidity. Common stent related symptoms include dysuria, urgency/frequency, and flank pain. PCNL is the favored treatment for stones >2cm. It involves a small incision in the flank, with complete fragmentation of stones and removal. It has the highest stone free rate, but also the highest complication rate. Possible complications include bleeding, infection/sepsis, injury to surrounding organs including the pleura, and collecting system  injury.  Parenchymal thinning could make PCNL difficult After an extensive discussion of the risks and benefits of the above treatment options, the patient would like to proceed with ureteroscopy.  Due to stone size we discussed possible need for a staged procedure.  Stone density ~ 500 HU which will improve fragmentation Preoperative urine culture ordered   ScAbbie SonsMDMedora27876 North Tallwood StreetSuIrionuUticaNC [phone]

## 2022-11-25 NOTE — Progress Notes (Signed)
11/25/2022 1:47 PM   Dillon [name] 1950-11-07 [id number]  Referring provider: Venita Lick, NP [place],  [place] [id number]  Chief Complaint  Patient presents with   Follow-up    HPI:    PMH: Past Medical History:  Diagnosis Date   Kidney stone    Meningitis    spinal   OCD (obsessive compulsive disorder)    Shingles     Surgical History: Past Surgical History:  Procedure Laterality Date   CYSTOSCOPY W/ URETERAL STENT PLACEMENT Right 10/12/2022   Procedure: CYSTOSCOPY WITH RETROGRADE PYELOGRAM/URETERAL STENT PLACEMENT;  Surgeon: Abbie Sons, MD;  Location: [facility] ORS;  Service: Urology;  Laterality: Right;   KIDNEY STONE SURGERY      Home Medications:  Allergies as of 11/25/2022       Reactions   Codeine Other (See Comments)   Unsure of reaction        Medication List        Accurate as of November 25, 2022  1:47 PM. If you have any questions, ask your nurse or doctor.          Accu-Chek Guide test strip Generic drug: glucose blood Use to check blood sugars 2-3 times daily with goals = <130 fasting in morning and <180 two hours after eating. Bring blood sugar log to appointments.   cyanocobalamin 1000 MCG tablet Commonly known as: VITAMIN B12 Take 1 tablet (1,000 mcg total) by mouth daily.   fluvoxaMINE 100 MG tablet Commonly known as: LUVOX Take 1 tablet (100 mg total) by mouth 2 (two) times daily at 8 am and 4 pm.   OLANZapine 10 MG tablet Commonly known as: ZYPREXA Take 1 tablet (10 mg total) by mouth at bedtime.   oxybutynin 5 MG tablet Commonly known as: DITROPAN Take 1 tablet (5 mg total) by mouth every 8 (eight) hours as needed for bladder spasms (frequency,urgency).   rosuvastatin 10 MG tablet Commonly known as: Crestor Take 1 tablet (10 mg total) by mouth daily.   sitaGLIPtin 100 MG tablet Commonly known as: Januvia Take 1 tablet (100 mg total) by mouth daily.   tamsulosin 0.4 MG Caps capsule Commonly  known as: FLOMAX Take 1 capsule (0.4 mg total) by mouth daily.   traZODone 50 MG tablet Commonly known as: DESYREL Take 1 tablet (50 mg total) by mouth at bedtime.   Vitamin D (Ergocalciferol) 1.25 MG (50000 UNIT) Caps capsule Commonly known as: DRISDOL Take 1 capsule (50,000 Units total) by mouth every 7 (seven) days.        Allergies:  Allergies  Allergen Reactions   Codeine Other (See Comments)    Unsure of reaction    Family History: Family History  Problem Relation Age of Onset   Cancer Mother        bladder   Emphysema Father    Emphysema Sister    COPD Sister    Cancer Brother        prostate    Social History:  reports that he has never smoked. He has never used smokeless tobacco. He reports that he does not drink alcohol and does not use drugs.   Physical Exam: There were no vitals taken for this visit.  Constitutional:  Alert and oriented, No acute distress. HEENT: [place] AT, moist mucus membranes.  Trachea midline, no masses. Cardiovascular: No clubbing, cyanosis, or edema. Respiratory: Normal respiratory effort, no increased work of breathing. GI: Abdomen is soft, nontender, nondistended, no abdominal masses GU:  No CVA tenderness Skin: No rashes, bruises or suspicious lesions. Neurologic: Grossly intact, no focal deficits, moving all 4 extremities. Psychiatric: Normal mood and affect.  Laboratory Data: Lab Results  Component Value Date   WBC 9.9 10/13/2022   HGB 15.5 10/13/2022   HCT 43.7 10/13/2022   MCV 88.6 10/13/2022   PLT 137 (L) 10/13/2022    Lab Results  Component Value Date   CREATININE 1.06 10/19/2022    No results found for: "PSA"  No results found for: "TESTOSTERONE"  Lab Results  Component Value Date   HGBA1C 6.3 (H) 10/11/2022    Urinalysis    Component Value Date/Time   COLORURINE AMBER (A) 10/11/2022 1830   APPEARANCEUR HAZY (A) 10/11/2022 1830   APPEARANCEUR Clear 09/28/2021 0849   LABSPEC 1.025 10/11/2022 1830    PHURINE 5.0 10/11/2022 1830   GLUCOSEU NEGATIVE 10/11/2022 1830   HGBUR SMALL (A) 10/11/2022 1830   BILIRUBINUR NEGATIVE 10/11/2022 1830   BILIRUBINUR Negative 09/28/2021 0849   KETONESUR 80 (A) 10/11/2022 1830   PROTEINUR 100 (A) 10/11/2022 1830   NITRITE NEGATIVE 10/11/2022 1830   LEUKOCYTESUR MODERATE (A) 10/11/2022 1830    Lab Results  Component Value Date   LABMICR 87.3 12/31/2021   WBCUA None seen 09/28/2021   LABEPIT None seen 09/28/2021   MUCUS Present (A) 09/28/2021   BACTERIA NONE SEEN 10/11/2022    Pertinent Imaging: *** No results found for this or any previous visit.  No results found for this or any previous visit.  No results found for this or any previous visit.  No results found for this or any previous visit.  No results found for this or any previous visit.  No valid procedures specified. No results found for this or any previous visit.  No results found for this or any previous visit.   Assessment & Plan:    There are no diagnoses linked to this encounter.  No follow-ups on file.  Abbie Sons, [place] [address] [place], [place] [address] [phone]

## 2022-11-26 ENCOUNTER — Other Ambulatory Visit: Payer: Self-pay | Admitting: Urology

## 2022-11-26 ENCOUNTER — Encounter: Payer: Self-pay | Admitting: Urology

## 2022-11-26 DIAGNOSIS — N200 Calculus of kidney: Secondary | ICD-10-CM

## 2022-11-26 LAB — URINALYSIS, COMPLETE
Bilirubin, UA: NEGATIVE
Glucose, UA: NEGATIVE
Ketones, UA: NEGATIVE
Nitrite, UA: NEGATIVE
Specific Gravity, UA: >1.03 — ABNORMAL HIGH (ref 1.005–1.030)
Urobilinogen, Ur: 1 mg/dL (ref 0.2–1.0)
pH, UA: 6 (ref 5.0–7.5)

## 2022-11-26 LAB — MICROSCOPIC EXAMINATION: RBC, Urine: >30 /hpf — AB (ref 0–2)

## 2022-11-26 NOTE — Progress Notes (Signed)
Surgical Physician Order Form [facility] [place]  * Scheduling expectation : Next Available  *Length of Case: 120 min  *Clearance needed: no  *Anticoagulation Instructions: N/A  *Aspirin Instructions: N/A  *Post-op visit Date/Instructions:  1 week cysto stent removal  *Diagnosis: Right Nephrolithiasis  *Procedure: right  Ureteroscopy w/laser lithotripsy & stent exchange [id number])   Additional orders: N/A  -Admit type: OUTpatient  -Anesthesia: General  -VTE Prophylaxis Standing Order SCD's       Other:   -Standing Lab Orders Per Anesthesia    Lab other: UA&Urine Culture-ordered 11/25/2022  -Standing Test orders EKG/Chest x-ray per Anesthesia       Test other:   - Medications:  Ceftriaxone(Rocephin) 1gm IV  -Other orders: Renal scan ordered and would like performed prior to surgery date

## 2022-11-27 NOTE — Progress Notes (Unsigned)
Psychiatric Initial Adult Assessment   Patient Identification: Dillon [name] MRN:  [id number] Date of Evaluation:  11/27/2022 Referral Source: *** Chief Complaint:  No chief complaint on file.  Visit Diagnosis: No diagnosis found.  History of Present Illness:   Dillon [name] is a 72 y.o. year old male with a history of depression, OCD, type II diabetes, who is referred for depression.     Fluvoxamine 100 mg twice a day, olanzapine 10 mg at night, trazodone 50 mg at night  Wife deceased in 01-14-15, ect in 01/13/17 Hospitalized in 01/14/2016 & 2017/01/13  Associated Signs/Symptoms: Depression Symptoms:  {DEPRESSION SYMPTOMS:20000} (Hypo) Manic Symptoms:  {BHH MANIC SYMPTOMS:22872} Anxiety Symptoms:  {BHH ANXIETY SYMPTOMS:22873} Psychotic Symptoms:  {BHH PSYCHOTIC SYMPTOMS:22874} PTSD Symptoms: {BHH PTSD SYMPTOMS:22875}  Past Psychiatric History:  Outpatient:  Psychiatry admission:  Previous suicide attempt:  Past trials of medication:  History of violence:  History of head injury:   Previous Psychotropic Medications: {YES/NO:21197}  Substance Abuse History in the last 12 months:  {yes no:314532}  Consequences of Substance Abuse: {BHH CONSEQUENCES OF SUBSTANCE ABUSE:22880}  Past Medical History:  Past Medical History:  Diagnosis Date   Kidney stone    Meningitis    spinal   OCD (obsessive compulsive disorder)    Shingles     Past Surgical History:  Procedure Laterality Date   CYSTOSCOPY W/ URETERAL STENT PLACEMENT Right 10/12/2022   Procedure: CYSTOSCOPY WITH RETROGRADE PYELOGRAM/URETERAL STENT PLACEMENT;  Surgeon: Abbie Sons, MD;  Location: [facility] ORS;  Service: Urology;  Laterality: Right;   KIDNEY STONE SURGERY      Family Psychiatric History: ***  Family History:  Family History  Problem Relation Age of Onset   Cancer Mother        bladder   Emphysema Father    Emphysema Sister    COPD Sister    Cancer Brother        prostate    Social History:    Social History   Socioeconomic History   Marital status: Widowed    Spouse name: Not on file   Number of children: Not on file   Years of education: Not on file   Highest education level: Not on file  Occupational History   Occupation: retired  Tobacco Use   Smoking status: Never   Smokeless tobacco: Never  Vaping Use   Vaping Use: Never used  Substance and Sexual Activity   Alcohol use: No    Alcohol/week: 0.0 standard drinks of alcohol   Drug use: No   Sexual activity: Not Currently  Other Topics Concern   Not on file  Social History Narrative   Not on file   Social Determinants of Health   Financial Resource Strain: Low Risk  (02/10/2022)   Overall Financial Resource Strain (CARDIA)    Difficulty of Paying Living Expenses: Not hard at all  Food Insecurity: No Food Insecurity (11/03/2022)   Hunger Vital Sign    Worried About Running Out of Food in the Last Year: Never true    [place] in the Last Year: Never true  Transportation Needs: No Transportation Needs (11/03/2022)   PRAPARE - Hydrologist (Medical): No    Lack of Transportation (Non-Medical): No  Physical Activity: Inactive (02/10/2022)   Exercise Vital Sign    Days of Exercise per Week: 0 days    Minutes of Exercise per Session: 0 min  Stress: No Stress Concern Present (02/10/2022)   [place]  Institute of Occupational Health - Occupational Stress Questionnaire    Feeling of Stress : Not at all  Social Connections: Moderately Integrated (02/10/2022)   Social Connection and Isolation Panel [NHANES]    Frequency of Communication with Friends and Family: Twice a week    Frequency of Social Gatherings with Friends and Family: Twice a week    Attends Religious Services: 1 to 4 times per year    Active Member of [employer] or Organizations: No    Attends Music therapist: More than 4 times per year    Marital Status: Widowed    Additional Social History:  ***  Allergies:   Allergies  Allergen Reactions   Codeine Other (See Comments)    Unsure of reaction    Metabolic Disorder Labs: Lab Results  Component Value Date   HGBA1C 6.3 (H) 10/11/2022   MPG 134 10/11/2022   MPG 91 03/27/2017   Lab Results  Component Value Date   PROLACTIN 29.7 (H) 01/05/2016   Lab Results  Component Value Date   CHOL 144 10/11/2022   TRIG 148 10/11/2022   HDL 44 10/11/2022   CHOLHDL 4.6 03/27/2017   VLDL 23 03/27/2017   LDLCALC 74 10/11/2022   LDLCALC 45 04/08/2022   Lab Results  Component Value Date   TSH 1.223 10/11/2022    Therapeutic Level Labs: No results found for: "LITHIUM" No results found for: "CBMZ" No results found for: "VALPROATE"  Current Medications: Current Outpatient Medications  Medication Sig Dispense Refill   cyanocobalamin (VITAMIN B12) 1000 MCG tablet Take 1 tablet (1,000 mcg total) by mouth daily. 90 tablet 4   fluvoxaMINE (LUVOX) 100 MG tablet Take 1 tablet (100 mg total) by mouth 2 (two) times daily at 8 am and 4 pm. 60 tablet 3   glucose blood (ACCU-CHEK GUIDE) test strip Use to check blood sugars 2-3 times daily with goals = <130 fasting in morning and <180 two hours after eating. Bring blood sugar log to appointments. 100 each 12   OLANZapine (ZYPREXA) 10 MG tablet Take 1 tablet (10 mg total) by mouth at bedtime. 30 tablet 3   oxybutynin (DITROPAN) 5 MG tablet Take 1 tablet (5 mg total) by mouth every 8 (eight) hours as needed for bladder spasms (frequency,urgency).     rosuvastatin (CRESTOR) 10 MG tablet Take 1 tablet (10 mg total) by mouth daily. 90 tablet 4   sitaGLIPtin (JANUVIA) 100 MG tablet Take 1 tablet (100 mg total) by mouth daily. 30 tablet 3   tamsulosin (FLOMAX) 0.4 MG CAPS capsule Take 1 capsule (0.4 mg total) by mouth daily. 30 capsule    traZODone (DESYREL) 50 MG tablet Take 1 tablet (50 mg total) by mouth at bedtime. 30 tablet 3   Vitamin D, Ergocalciferol, (DRISDOL) 1.25 MG (50000 UNIT) CAPS  capsule Take 1 capsule (50,000 Units total) by mouth every 7 (seven) days. 12 capsule 0   No current facility-administered medications for this visit.    Musculoskeletal: Strength & Muscle Tone: within normal limits Gait & Station: normal Patient leans: N/A  Psychiatric Specialty Exam: Review of Systems  There were no vitals taken for this visit.There is no height or weight on file to calculate BMI.  General Appearance: {Appearance:22683}  Eye Contact:  {BHH EYE CONTACT:22684}  Speech:  Clear and Coherent  Volume:  Normal  Mood:  {BHH MOOD:22306}  Affect:  {Affect (PAA):22687}  Thought Process:  Coherent  Orientation:  Full (Time, Place, and Person)  Thought Content:  Logical  Suicidal Thoughts:  {ST/HT (PAA):22692}  Homicidal Thoughts:  {ST/HT (PAA):22692}  Memory:  Immediate;   Good  Judgement:  {Judgement (PAA):22694}  Insight:  {Insight (PAA):22695}  Psychomotor Activity:  Normal  Concentration:  Concentration: Good and Attention Span: Good  Recall:  Good  Fund of Knowledge:Good  Language: Good  Akathisia:  No  Handed:  Right  AIMS (if indicated):  not done  Assets:  Communication Skills Desire for Improvement  ADL's:  Intact  Cognition: WNL  Sleep:  {BHH GOOD/FAIR/POOR:22877}   Screenings: AIMS    Flowsheet Row Admission (Discharged) from 03/22/2017 in [place] Admission (Discharged) from 01/04/2016 in [place] Admission (Discharged) from 12/15/2015 in [place] Total Score 3 0 0      AUDIT    Flowsheet Row Admission (Discharged) from 10/19/2022 in [place] Admission (Discharged) from 03/22/2017 in [place] Admission (Discharged) from 01/04/2016 in [place] Admission (Discharged) from 12/15/2015 in [place]  Alcohol Use Disorder Identification Test Final Score (AUDIT) 0 0 0 0       ECT-MADRS    Flowsheet Row ECT Treatment from 04/17/2017 in [facility] ECT Treatment from 03/31/2017 in [place] Admission (Discharged) from 03/22/2017 in [place] Admission (Discharged) from 01/04/2016 in [place] Total Score 28 30 30 20      $ [place] Office Visit from 10/11/2022 in [place] Office Visit from 04/08/2022 in [place] Office Visit from 12/31/2021 in [place] Office Visit from 11/02/2021 in [place] Office Visit from 09/28/2021 in [facility]  Total GAD-7 Score 15 3 1 $ 0 16      Mini-Mental    Flowsheet Row ECT Treatment from 04/17/2017 in [facility] ECT Treatment from 03/31/2017 in [place] Admission (Discharged) from 03/22/2017 in [place] Admission (Discharged) from 01/04/2016 in [place]  Total Score (max 30 points ) 30 28 29 30      $ PHQ2-9    Flowsheet Row Office Visit from 10/11/2022 in [place] Office Visit from 04/08/2022 in [place] from 02/10/2022 in [facility] Office Visit from 12/31/2021 in [place] Office Visit from 11/02/2021 in [facility]  PHQ-2 Total Score 5 0 0 1 0  PHQ-9 Total Score 16 4 0 3 1      Flowsheet Row Admission (Discharged) from 10/19/2022 in [facility] ED to Hosp-Admission (Discharged) from 10/11/2022 in [facility] No Risk No Risk       Assessment and Plan:  Assessment  Plan   The patient demonstrates the following risk  factors for suicide: Chronic risk factors for suicide include: {Chronic Risk Factors for HD:3327074. Acute risk factors for suicide include: {Acute Risk Factors for NL:6244280. Protective factors for this patient include: {Protective Factors for Suicide FR:7288263. Considering these factors, the overall suicide risk at this point appears to be {Desc; low/moderate/high:110033}. Patient {ACTION; IS/IS GI:087931 appropriate for outpatient follow up.   Collaboration of Care: {BH OP Collaboration of Care:21014065}  Patient/Guardian was advised Release of Information must be  obtained prior to any record release in order to collaborate their care with an outside provider. Patient/Guardian was advised if they have not already done so to contact the registration department to sign all necessary forms in order for [place] to release information regarding their care.   Consent: Patient/Guardian gives verbal consent for treatment and assignment of benefits for services provided during this visit. Patient/Guardian expressed understanding and agreed to proceed.   Norman Clay, MD 2/11/20243:09 PM

## 2022-11-29 ENCOUNTER — Ambulatory Visit (INDEPENDENT_AMBULATORY_CARE_PROVIDER_SITE_OTHER): Payer: Medicare HMO | Admitting: Psychiatry

## 2022-11-29 ENCOUNTER — Telehealth: Payer: Self-pay

## 2022-11-29 ENCOUNTER — Encounter: Payer: Self-pay | Admitting: Psychiatry

## 2022-11-29 VITALS — BP 142/74 | HR 82 | Temp 98.1°F | Ht 69.0 in | Wt 194.4 lb

## 2022-11-29 DIAGNOSIS — G4700 Insomnia, unspecified: Secondary | ICD-10-CM

## 2022-11-29 DIAGNOSIS — F429 Obsessive-compulsive disorder, unspecified: Secondary | ICD-10-CM | POA: Diagnosis not present

## 2022-11-29 DIAGNOSIS — F3341 Major depressive disorder, recurrent, in partial remission: Secondary | ICD-10-CM

## 2022-11-29 NOTE — Telephone Encounter (Signed)
I spoke with [name]. We have discussed possible surgery dates and Tuesday February 27th, 2024 was agreed upon by all parties. Patient given information about surgery date, what to expect pre-operatively and post operatively.  We discussed that a Pre-Admission Testing office will be calling to set up the pre-op visit that will take place prior to surgery, and that these appointments are typically done over the phone with a Pre-Admissions RN. Informed patient that our office will communicate any additional care to be provided after surgery. Patients questions or concerns were discussed during our call. Advised to call our office should there be any additional information, questions or concerns that arise. Patient verbalized understanding.

## 2022-11-29 NOTE — Patient Instructions (Signed)
Continue Fluvoxamine 100 mg twice a day Continue olanzapine 10 mg at night  Obtain EKG Continue trazodone 50 mg at night as needed for insomnia Next appointment: 4/11 at 8 am

## 2022-11-29 NOTE — Progress Notes (Signed)
   [facility] From  Surgery Date: Date: 12/13/2022  Surgeon: Dr. John Giovanni, MD  Inpt ( No  )   Outpt (Yes)   Obs ( No  )   Diagnosis: N20.0 Right Nephrolithiasis  -CPT: [phone]  Surgery: Right Ureteroscopy with laser lithotripsy and stent exchange  Stop Anticoagulations: No  Cardiac/Medical/Pulmonary Clearance needed: no  *Orders entered into EPIC  Date: 11/29/22   *Case booked in [place]  Date: 11/28/2022  *Notified pt of Surgery: Date: 11/28/2022  PRE-OP UA & CX: yes, obtained in clinic on 11/25/2022  *Placed into Prior Authorization Work Fabio Bering Date: 11/29/22  Assistant/laser/rep:No

## 2022-12-01 LAB — CULTURE, URINE COMPREHENSIVE

## 2022-12-05 ENCOUNTER — Encounter
Admission: RE | Admit: 2022-12-05 | Discharge: 2022-12-05 | Disposition: A | Discharge status: Home / Self Care | Payer: Medicare HMO | Source: Ambulatory Visit | Attending: Urology | Admitting: Urology

## 2022-12-05 DIAGNOSIS — N132 Hydronephrosis with renal and ureteral calculous obstruction: Secondary | ICD-10-CM | POA: Diagnosis not present

## 2022-12-05 DIAGNOSIS — N1330 Unspecified hydronephrosis: Secondary | ICD-10-CM | POA: Diagnosis not present

## 2022-12-05 DIAGNOSIS — N200 Calculus of kidney: Secondary | ICD-10-CM | POA: Diagnosis not present

## 2022-12-05 MED ORDER — TECHNETIUM TC 99M MERTIATIDE
5.5600 | Freq: Once | INTRAVENOUS | Status: AC | PRN
  Administered 2022-12-05: 5.56 via INTRAVENOUS

## 2022-12-05 MED ORDER — FUROSEMIDE 10 MG/ML IJ SOLN
44.0000 mg | Freq: Once | INTRAMUSCULAR | Status: DC
  Filled 2022-12-05: qty 4.4

## 2022-12-05 MED ORDER — FUROSEMIDE 10 MG/ML IJ SOLN
44.0000 mg | Freq: Once | INTRAMUSCULAR | Status: DC
  Filled 2022-12-05 (×2): qty 4.4

## 2022-12-07 ENCOUNTER — Ambulatory Visit: Payer: Self-pay

## 2022-12-07 ENCOUNTER — Encounter
Admission: RE | Admit: 2022-12-07 | Discharge: 2022-12-07 | Disposition: A | Discharge status: Home / Self Care | Payer: Medicare HMO | Source: Ambulatory Visit | Attending: Urology | Admitting: Urology

## 2022-12-07 DIAGNOSIS — Z01810 Encounter for preprocedural cardiovascular examination: Secondary | ICD-10-CM

## 2022-12-07 DIAGNOSIS — Z01818 Encounter for other preprocedural examination: Secondary | ICD-10-CM

## 2022-12-07 DIAGNOSIS — R9431 Abnormal electrocardiogram [ECG] [EKG]: Secondary | ICD-10-CM

## 2022-12-07 DIAGNOSIS — E1165 Type 2 diabetes mellitus with hyperglycemia: Secondary | ICD-10-CM

## 2022-12-07 HISTORY — DX: Malformation of urachus: Q64.4

## 2022-12-07 HISTORY — DX: Benign prostatic hyperplasia with lower urinary tract symptoms: N13.8

## 2022-12-07 HISTORY — DX: Unspecified hydronephrosis: N13.30

## 2022-12-07 HISTORY — DX: Hyperosmolality and hypernatremia: E87.0

## 2022-12-07 HISTORY — DX: Unspecified macular degeneration: H35.30

## 2022-12-07 HISTORY — DX: Type 2 diabetes mellitus without complications: E11.9

## 2022-12-07 HISTORY — DX: Hypokalemia: E87.6

## 2022-12-07 HISTORY — DX: Thrombocytopenia, unspecified: D69.6

## 2022-12-07 HISTORY — DX: Hyperlipidemia, unspecified: E78.5

## 2022-12-07 HISTORY — DX: Other obstructive and reflux uropathy: N40.1

## 2022-12-07 HISTORY — DX: Unspecified severe protein-calorie malnutrition: E43

## 2022-12-07 HISTORY — DX: Depression, unspecified: F32.A

## 2022-12-07 NOTE — Telephone Encounter (Signed)
  Chief Complaint: medication assistance  Symptoms: NA Frequency: today Pertinent Negatives: NA Disposition: []$ ED /[]$ Urgent Care (no appt availability in office) / [x]$ Appointment(In office/virtual)/ []$  [facility] Virtual Care/ [x]$ Home Care/ []$ Refused Recommended Disposition /[]$ [facility]/ []$  Follow-up with PCP Additional Notes: pt states he is unsure which dosage of fluvoxaMINE he is suppose to be taking since discharged from hospital on 11/02/22. Pt had 26m QD from [facility]Seneca NP and [facility]. Advised pt he is suppose to be taking fluvoxaMINE 100 MG tablet BID and recommended he schedule FU with Jolene. Pt agreed and scheduled for 12/14/22 at 1500, pt is suppose to have kidney stone removed Tuesday before so will call back if unable to come to [facility]   Summary: medication management - hospital discharge   [facility] his fluvoxaMINE (LUVOX) 100 MG tablet vs what PCP prescribed. Patient seeking clarity, Offered to schedule a hospital follow appointment with PCP, PCP has no available hospital follow up until March 14th         Reason for Disposition  Caller has medicine question only, adult not sick, AND triager answers question  Answer Assessment - Initial Assessment Questions 1. NAME of MEDICINE: "What medicine(s) are you calling about?"     fluvoxaMINE 100 MG tablet 2. QUESTION: "What is your question?" (e.g., double dose of medicine, side effect)     Wanting to know which dosage to take  3. PRESCRIBER: "Who prescribed the medicine?" Reason: if prescribed by specialist, call should be referred to that group.     [facility]  4. SYMPTOMS: "Do you have any symptoms?" If Yes, ask: "What symptoms are you having?"  "How bad are the symptoms (e.g., mild, moderate, severe)     NA  Protocols used: Medication Question Call-A-AH

## 2022-12-07 NOTE — Patient Instructions (Addendum)
Your procedure is scheduled on:12-13-22 Tuesday Report to the Registration Desk on the 1st floor of the [facility].Then proceed to the 2nd floor Surgery To find out your arrival time, please call [phone] between 1PM - 3PM on:12-12-22 Monday If your arrival time is 6:00 am, do not arrive before that time as the [facility] entrance doors do not open until 6:00 am.  REMEMBER: Instructions that are not followed completely may result in serious medical risk, up to and including death; or upon the discretion of your surgeon and anesthesiologist your surgery may need to be rescheduled.  Do not eat food OR drink any liquids after midnight the night before surgery.  No gum chewing or hard candies.  One week prior to surgery: Stop Anti-inflammatories (NSAIDS) such as Advil, Aleve, Ibuprofen, Motrin, Naproxen, Naprosyn and Aspirin based products such as Excedrin, Goody's Powder, BC Powder.You may however, take Tylenol if needed for pain up until the day of surgery.  Stop ANY OVER THE COUNTER supplements/vitamins NOW (12-07-22)until after surgery (Vitamin B12, Areds)  TAKE ONLY THESE MEDICATIONS THE MORNING OF SURGERY WITH A SIP OF WATER: -fluvoxaMINE (LUVOX)   No Alcohol for 24 hours before or after surgery.  No Smoking including e-cigarettes for 24 hours before surgery.  No chewable tobacco products for at least 6 hours before surgery.  No nicotine patches on the day of surgery.  Do not use any "recreational" drugs for at least a week (preferably 2 weeks) before your surgery.  Please be advised that the combination of cocaine and anesthesia may have negative outcomes, up to and including death. If you test positive for cocaine, your surgery will be cancelled.  On the morning of surgery brush your teeth with toothpaste and water, you may rinse your mouth with mouthwash if you wish. Do not swallow any toothpaste or mouthwash.  Do not wear jewelry, make-up, hairpins, clips or nail  polish.  Do not wear lotions, powders, or perfumes.   Do not shave body hair from the neck down 48 hours before surgery.  Contact lenses, hearing aids and dentures may not be worn into surgery.  Do not bring valuables to the hospital. [facility] is not responsible for any missing/lost belongings or valuables.   Notify your doctor if there is any change in your medical condition (cold, fever, infection).  Wear comfortable clothing (specific to your surgery type) to the hospital.  After surgery, you can help prevent lung complications by doing breathing exercises.  Take deep breaths and cough every 1-2 hours. Your doctor may order a device called an Incentive Spirometer to help you take deep breaths. When coughing or sneezing, hold a pillow firmly against your incision with both hands. This is called "splinting." Doing this helps protect your incision. It also decreases belly discomfort.  If you are being admitted to the hospital overnight, leave your suitcase in the car. After surgery it may be brought to your room.  In case of increased patient census, it may be necessary for you, the patient, to continue your postoperative care in the Same Day Surgery department.  If you are being discharged the day of surgery, you will not be allowed to drive home. You will need a responsible individual to drive you home and stay with you for 24 hours after surgery.   If you are taking public transportation, you will need to have a responsible individual with you.  Please call the [facility] Dept. at [phone] if you have any questions  about these instructions.  Surgery Visitation Policy:  Patients undergoing a surgery or procedure may have two family members or support persons with them as long as the person is not COVID-19 positive or experiencing its symptoms.   Due to an increase in RSV and influenza rates and associated hospitalizations, children ages 51 and under will not be  able to visit patients in [facility]. Masks continue to be strongly recommended.

## 2022-12-09 ENCOUNTER — Encounter
Admission: RE | Admit: 2022-12-09 | Discharge: 2022-12-09 | Disposition: A | Discharge status: Home / Self Care | Payer: Medicare HMO | Source: Ambulatory Visit | Attending: Urology | Admitting: Urology

## 2022-12-09 DIAGNOSIS — E1165 Type 2 diabetes mellitus with hyperglycemia: Secondary | ICD-10-CM | POA: Diagnosis not present

## 2022-12-09 DIAGNOSIS — Z01810 Encounter for preprocedural cardiovascular examination: Secondary | ICD-10-CM | POA: Diagnosis not present

## 2022-12-09 DIAGNOSIS — R9431 Abnormal electrocardiogram [ECG] [EKG]: Secondary | ICD-10-CM | POA: Insufficient documentation

## 2022-12-10 NOTE — Patient Instructions (Signed)
Kidney Stones  Kidney stones are solid, rock-like deposits that form inside of the kidneys. The kidneys are a pair of organs that make urine. A kidney stone may form in a kidney and move into other parts of the urinary tract, including the tubes that connect the kidneys to the bladder (ureters), the bladder, and the tube that carries urine out of the body (urethra). As the stone moves through these areas, it can cause intense pain and block the flow of urine. Kidney stones are created when high levels of certain minerals are found in the urine. The stones are usually passed out of the body through urination, but in some cases, medical treatment may be needed to remove them. What are the causes? Kidney stones may be caused by: A condition in which certain glands produce too much parathyroid hormone (primary hyperparathyroidism), which causes too much calcium buildup in the blood. A buildup of uric acid crystals in the bladder (hyperuricosuria). Uric acid is a chemical that the body produces when you eat certain foods. It usually leaves the body in the urine. Narrowing (stricture) of one or both of the ureters. A kidney blockage that is present at birth (congenital obstruction). Past surgery on the kidney or the ureters. What increases the risk? The following factors may make you more likely to develop this condition: Having had a kidney stone in the past. Having a family history of kidney stones. Not drinking enough water. Eating a diet that is high in protein, salt (sodium), or sugar. Being overweight or obese. What are the signs or symptoms? Symptoms of a kidney stone may include: Pain in the side of the abdomen, right below the ribs (flank pain). Pain usually spreads (radiates) to the groin. Needing to urinate often or urgently. Painful urination. Blood in the urine (hematuria). Nausea. Vomiting. Fever and chills. How is this diagnosed? This condition may be diagnosed based on: Your  symptoms and medical history. A physical exam. Blood tests. Urine tests. These may be done before and after the stone passes out of your body through urination. Imaging tests, such as a CT scan, abdominal X-ray, or ultrasound. A procedure to examine the inside of the bladder (cystoscopy). How is this treated? Treatment for kidney stones depends on the size, location, and makeup of the stones. Kidney stones will often pass out of the body through urination. You may need to: Increase your fluid intake to help pass the stone. In some cases, you may be given fluids through an IV and may need to be monitored in the hospital. Take medicine for pain. Make changes in your diet to help prevent kidney stones from coming back. Sometimes, procedures are needed to remove a kidney stone. This may involve: A procedure to break up kidney stones using: A focused beam of light (laser therapy). Shock waves (extracorporeal shock wave lithotripsy). Surgery to remove kidney stones. This may be needed if you have severe pain or have stones that block your urinary tract. Follow these instructions at home: Medicines Take over-the-counter and prescription medicines only as told by your health care provider. Ask your health care provider if the medicine prescribed to you requires you to avoid driving or using heavy machinery. Eating and drinking Drink enough fluid to keep your urine pale yellow. You may be instructed to drink at least 8-10 glasses of water each day. This will help you pass the kidney stone. If directed, change your diet. This may include: Limiting how much sodium you eat. Eating more fruits  and vegetables. Limiting how much animal protein you eat. Animal proteins include red meat, poultry, fish, and eggs. Eating a normal amount of calcium (1,000-1,300 mg per day). Follow instructions from your health care provider about eating or drinking restrictions. General instructions Collect urine samples as  told by your health care provider. You may need to collect a urine sample: 24 hours after you pass the stone. 8-12 weeks after you pass the kidney stone, and every 6-12 months after that. Strain your urine every time you urinate, for as long as directed. Use the strainer that your health care provider recommends. Do not throw out the kidney stone after passing it. Keep the stone so it can be tested by your health care provider. Testing the makeup of your kidney stone may help prevent you from getting kidney stones in the future. Keep all follow-up visits. You may need follow-up X-rays or ultrasounds to make sure that your stone has passed. How is this prevented? To prevent another kidney stone: Drink enough fluid to keep your urine pale yellow. This is the best way to prevent kidney stones. Eat a healthy diet. Follow recommendations from your health care provider about foods to avoid. Recommendations vary depending on the type of kidney stone that you have. You may be instructed to eat a low-protein diet. Maintain a healthy weight. Where to find more information [place] ([facility]): www.kidney.[facility]): www.urologyhealth.org Contact a health care provider if: You have pain that gets worse or does not get better with medicine. Get help right away if: You have a fever or chills. You develop severe pain. You develop new abdominal pain. You faint. You are unable to urinate. Summary Kidney stones are solid, rock-like deposits that form inside of the kidneys. Kidney stones can cause nausea, vomiting, blood in the urine, abdominal pain, and the urge to urinate often. Treatment for kidney stones depends on the size, location, and makeup of the stones. Kidney stones will often pass out of the body through urination. Kidney stones can be prevented by drinking enough fluids, eating a healthy diet, and maintaining a healthy weight. This information is not intended  to replace advice given to you by your health care provider. Make sure you discuss any questions you have with your health care provider. Document Revised: 01/12/2022 Document Reviewed: 01/12/2022 Elsevier Patient Education  [place].

## 2022-12-12 MED ORDER — SODIUM CHLORIDE 0.9 % IV SOLN: INTRAVENOUS | Status: DC

## 2022-12-12 MED ORDER — CHLORHEXIDINE GLUCONATE 0.12 % MT SOLN: 15.0000 mL | Freq: Once | OROMUCOSAL | Status: AC

## 2022-12-12 MED ORDER — FAMOTIDINE 20 MG PO TABS: 20.0000 mg | ORAL_TABLET | Freq: Once | ORAL | Status: AC

## 2022-12-12 MED ORDER — SODIUM CHLORIDE 0.9 % IV SOLN
1.0000 g | INTRAVENOUS | Status: AC
  Administered 2022-12-13: 1 g via INTRAVENOUS
  Filled 2022-12-12: qty 1

## 2022-12-12 MED ORDER — ORAL CARE MOUTH RINSE: 15.0000 mL | Freq: Once | OROMUCOSAL | Status: AC

## 2022-12-13 ENCOUNTER — Ambulatory Visit: Payer: Medicare HMO

## 2022-12-13 ENCOUNTER — Ambulatory Visit
Admission: RE | Admit: 2022-12-13 | Discharge: 2022-12-13 | Disposition: A | Discharge status: Home / Self Care | Payer: Medicare HMO | Source: Ambulatory Visit | Attending: Urology | Admitting: Urology

## 2022-12-13 ENCOUNTER — Encounter
Admission: RE | Disposition: A | Discharge status: Home / Self Care | Payer: Self-pay | Source: Ambulatory Visit | Attending: Urology

## 2022-12-13 ENCOUNTER — Other Ambulatory Visit: Payer: Self-pay

## 2022-12-13 ENCOUNTER — Ambulatory Visit: Payer: Medicare HMO | Admitting: Urgent Care

## 2022-12-13 ENCOUNTER — Encounter: Payer: Self-pay | Admitting: Urology

## 2022-12-13 DIAGNOSIS — Z7984 Long term (current) use of oral hypoglycemic drugs: Secondary | ICD-10-CM | POA: Insufficient documentation

## 2022-12-13 DIAGNOSIS — Z01818 Encounter for other preprocedural examination: Secondary | ICD-10-CM

## 2022-12-13 DIAGNOSIS — N200 Calculus of kidney: Secondary | ICD-10-CM | POA: Diagnosis not present

## 2022-12-13 DIAGNOSIS — N132 Hydronephrosis with renal and ureteral calculous obstruction: Secondary | ICD-10-CM | POA: Diagnosis not present

## 2022-12-13 DIAGNOSIS — E119 Type 2 diabetes mellitus without complications: Secondary | ICD-10-CM | POA: Insufficient documentation

## 2022-12-13 DIAGNOSIS — F418 Other specified anxiety disorders: Secondary | ICD-10-CM | POA: Diagnosis not present

## 2022-12-13 DIAGNOSIS — Z8619 Personal history of other infectious and parasitic diseases: Secondary | ICD-10-CM | POA: Diagnosis not present

## 2022-12-13 HISTORY — PX: CYSTOSCOPY/URETEROSCOPY/HOLMIUM LASER/STENT PLACEMENT: SHX6546

## 2022-12-13 LAB — GLUCOSE, CAPILLARY
Glucose-Capillary: 315 mg/dL — ABNORMAL HIGH (ref 70–99)
Glucose-Capillary: 287 mg/dL — ABNORMAL HIGH (ref 70–99)
Glucose-Capillary: 218 mg/dL — ABNORMAL HIGH (ref 70–99)

## 2022-12-13 SURGERY — CYSTOSCOPY/URETEROSCOPY/HOLMIUM LASER/STENT PLACEMENT
Anesthesia: General | Site: Ureter | Laterality: Right

## 2022-12-13 MED ORDER — ONDANSETRON HCL 4 MG/2ML IJ SOLN
INTRAMUSCULAR | Status: DC | PRN
  Administered 2022-12-13: 4 mg via INTRAVENOUS

## 2022-12-13 MED ORDER — PHENYLEPHRINE HCL-NACL 20-0.9 MG/250ML-% IV SOLN
INTRAVENOUS | Status: AC
  Filled 2022-12-13: qty 250

## 2022-12-13 MED ORDER — INSULIN ASPART 100 UNIT/ML IJ SOLN
INTRAMUSCULAR | Status: AC
  Administered 2022-12-13: 5 [IU] via SUBCUTANEOUS
  Filled 2022-12-13: qty 1

## 2022-12-13 MED ORDER — CHLORHEXIDINE GLUCONATE 0.12 % MT SOLN
OROMUCOSAL | Status: AC
  Administered 2022-12-13: 15 mL via OROMUCOSAL
  Filled 2022-12-13: qty 15

## 2022-12-13 MED ORDER — INSULIN ASPART 100 UNIT/ML IJ SOLN: 5.0000 [IU] | Freq: Once | INTRAMUSCULAR | Status: AC

## 2022-12-13 MED ORDER — OXYCODONE HCL 5 MG/5ML PO SOLN: 5.0000 mg | Freq: Once | ORAL | Status: DC | PRN

## 2022-12-13 MED ORDER — PHENYLEPHRINE 80 MCG/ML (10ML) SYRINGE FOR IV PUSH (FOR BLOOD PRESSURE SUPPORT)
PREFILLED_SYRINGE | INTRAVENOUS | Status: AC
  Filled 2022-12-13: qty 10

## 2022-12-13 MED ORDER — LIDOCAINE HCL (PF) 2 % IJ SOLN
INTRAMUSCULAR | Status: AC
  Filled 2022-12-13: qty 10

## 2022-12-13 MED ORDER — ONDANSETRON HCL 4 MG/2ML IJ SOLN
INTRAMUSCULAR | Status: AC
  Filled 2022-12-13: qty 2

## 2022-12-13 MED ORDER — OXYBUTYNIN CHLORIDE 5 MG PO TABS: ORAL_TABLET | ORAL | 0 refills | Status: DC

## 2022-12-13 MED ORDER — FENTANYL CITRATE (PF) 100 MCG/2ML IJ SOLN
INTRAMUSCULAR | Status: DC | PRN
  Administered 2022-12-13: 100 ug via INTRAVENOUS

## 2022-12-13 MED ORDER — HYDROCODONE-ACETAMINOPHEN 5-325 MG PO TABS: 1.0000 | ORAL_TABLET | Freq: Four times a day (QID) | ORAL | 0 refills | Status: DC | PRN

## 2022-12-13 MED ORDER — TAMSULOSIN HCL 0.4 MG PO CAPS: 0.4000 mg | ORAL_CAPSULE | Freq: Every day | ORAL | 0 refills | Status: DC

## 2022-12-13 MED ORDER — SODIUM CHLORIDE 0.9 % IR SOLN
Status: DC | PRN
  Administered 2022-12-13 (×2): 3000 mL via INTRAVESICAL

## 2022-12-13 MED ORDER — CHLORHEXIDINE GLUCONATE 0.12 % MT SOLN
OROMUCOSAL | Status: AC
  Filled 2022-12-13: qty 15

## 2022-12-13 MED ORDER — DEXMEDETOMIDINE HCL IN NACL 200 MCG/50ML IV SOLN
INTRAVENOUS | Status: DC | PRN
  Administered 2022-12-13: 4 ug via INTRAVENOUS
  Administered 2022-12-13: 8 ug via INTRAVENOUS
  Administered 2022-12-13 (×2): 4 ug via INTRAVENOUS

## 2022-12-13 MED ORDER — FAMOTIDINE 20 MG PO TABS
ORAL_TABLET | ORAL | Status: AC
  Administered 2022-12-13: 20 mg via ORAL
  Filled 2022-12-13: qty 1

## 2022-12-13 MED ORDER — PROPOFOL 10 MG/ML IV BOLUS
INTRAVENOUS | Status: DC | PRN
  Administered 2022-12-13: 30 mg via INTRAVENOUS
  Administered 2022-12-13: 50 mg via INTRAVENOUS
  Administered 2022-12-13: 150 mg via INTRAVENOUS
  Administered 2022-12-13: 20 mg via INTRAVENOUS

## 2022-12-13 MED ORDER — IOHEXOL 180 MG/ML  SOLN
INTRAMUSCULAR | Status: DC | PRN
  Administered 2022-12-13 (×2): 10 mL

## 2022-12-13 MED ORDER — PHENYLEPHRINE HCL-NACL 20-0.9 MG/250ML-% IV SOLN
INTRAVENOUS | Status: DC | PRN
  Administered 2022-12-13: 30 ug/min via INTRAVENOUS

## 2022-12-13 MED ORDER — LACTATED RINGERS IV SOLN: INTRAVENOUS | Status: DC | PRN

## 2022-12-13 MED ORDER — FENTANYL CITRATE (PF) 100 MCG/2ML IJ SOLN
INTRAMUSCULAR | Status: AC
  Filled 2022-12-13: qty 2

## 2022-12-13 MED ORDER — FENTANYL CITRATE (PF) 100 MCG/2ML IJ SOLN: 25.0000 ug | INTRAMUSCULAR | Status: DC | PRN

## 2022-12-13 MED ORDER — OXYCODONE HCL 5 MG PO TABS: 5.0000 mg | ORAL_TABLET | Freq: Once | ORAL | Status: DC | PRN

## 2022-12-13 MED ORDER — LIDOCAINE HCL (CARDIAC) PF 100 MG/5ML IV SOSY
PREFILLED_SYRINGE | INTRAVENOUS | Status: DC | PRN
  Administered 2022-12-13: 80 mg via INTRAVENOUS

## 2022-12-13 MED ORDER — PHENYLEPHRINE 80 MCG/ML (10ML) SYRINGE FOR IV PUSH (FOR BLOOD PRESSURE SUPPORT)
PREFILLED_SYRINGE | INTRAVENOUS | Status: DC | PRN
  Administered 2022-12-13: 160 ug via INTRAVENOUS
  Administered 2022-12-13: 80 ug via INTRAVENOUS
  Administered 2022-12-13: 160 ug via INTRAVENOUS
  Administered 2022-12-13: 80 ug via INTRAVENOUS
  Administered 2022-12-13 (×3): 160 ug via INTRAVENOUS

## 2022-12-13 SURGICAL SUPPLY — 29 items
BAG DRAIN SIEMENS DORNER NS (MISCELLANEOUS) ×1 IMPLANT
BAG DRN NS LF (MISCELLANEOUS) ×1
BASKET ZERO TIP 1.9FR (BASKET) IMPLANT
BRUSH SCRUB EZ 1% IODOPHOR (MISCELLANEOUS) ×1 IMPLANT
BSKT STON RTRVL ZERO TP 1.9FR (BASKET)
CATH URET FLEX-TIP 2 LUMEN 10F (CATHETERS) IMPLANT
CATH URETL OPEN END 6X70 (CATHETERS) IMPLANT
CNTNR URN SCR LID CUP LEK RST (MISCELLANEOUS) IMPLANT
CONT SPEC 4OZ STRL OR WHT (MISCELLANEOUS)
DRAPE UTILITY 15X26 TOWEL STRL (DRAPES) ×1 IMPLANT
FIBER LASER MOSES 200 DFL (Laser) IMPLANT
GLOVE SURG UNDER POLY LF SZ7.5 (GLOVE) ×1 IMPLANT
GOWN STRL REUS W/ TWL LRG LVL3 (GOWN DISPOSABLE) ×1 IMPLANT
GOWN STRL REUS W/ TWL XL LVL3 (GOWN DISPOSABLE) ×1 IMPLANT
GOWN STRL REUS W/TWL LRG LVL3 (GOWN DISPOSABLE) ×1
GOWN STRL REUS W/TWL XL LVL3 (GOWN DISPOSABLE) ×1
GUIDEWIRE STR DUAL SENSOR (WIRE) ×1 IMPLANT
IV NS IRRIG 3000ML ARTHROMATIC (IV SOLUTION) ×1 IMPLANT
KIT TURNOVER CYSTO (KITS) ×1 IMPLANT
PACK CYSTO AR (MISCELLANEOUS) ×1 IMPLANT
SET CYSTO W/LG BORE CLAMP LF (SET/KITS/TRAYS/PACK) ×1 IMPLANT
SHEATH NAVIGATOR HD 12/14X36 (SHEATH) IMPLANT
SHEATH NAVIGATOR HD 12/14X46 (SHEATH) IMPLANT
STENT URET 6FRX24 CONTOUR (STENTS) IMPLANT
STENT URET 6FRX26 CONTOUR (STENTS) IMPLANT
SURGILUBE 2OZ TUBE FLIPTOP (MISCELLANEOUS) ×1 IMPLANT
TRACTIP FLEXIVA PULSE ID 200 (Laser) IMPLANT
VALVE UROSEAL ADJ ENDO (VALVE) IMPLANT
WATER STERILE IRR 500ML POUR (IV SOLUTION) ×1 IMPLANT

## 2022-12-13 NOTE — Op Note (Signed)
Preoperative diagnosis:  Right nephrolithiasis   Postoperative diagnosis:  Right nephrolithiasis  Procedure:  Cystoscopy Right ureteroscopy and stone removal-staged Ureteroscopic laser lithotripsy Right ureteral stent exchange (38F/24 cm)  Right retrograde pyelography with interpretation Intraoperative fluoroscopy < 30 min  Surgeon: Nicki Reaper C. Neveyah Garzon, M.D.  Anesthesia: General  Complications: None  Intraoperative findings: Cystoscopy-wide caliber bulbar urethral stricture which did not impede passage of the 21 French cystoscope.  Moderate lateral lobe enlargement.  Inflammatory changes right UO secondary to indwelling stent Ureteropyeloscopy-ureter with normal mucosa.  No calculi or stricture noted.  Marked inflammatory changes at UPJ secondary to calculus.  Severe calyceal dilation. Right retrograde pyelography post procedure showed no contrast extravasation.  Severe hydronephrosis.  Filling defect right lower calyx secondary to remaining calculus  EBL: Minimal  Specimens: None   Indication: Benjamim Mathison is a 72 y.o. year old patient with a 3 cm partial staghorn calculus obstructing the UPJ with severe right hydronephrosis and parenchymal thinning.  Stent placed 10/12/2022.  Recent renal scan with differential function of the right kidney at 29%.  After reviewing the management options for treatment, the patient elected to proceed with the above surgical procedure(s). We have discussed the potential benefits and risks of the procedure, side effects of the proposed treatment, the likelihood of the patient achieving the goals of the procedure, and any potential problems that might occur during the procedure or recuperation. Informed consent has been obtained.  Description of procedure:  The patient was taken to the operating room and general anesthesia was induced.  The patient was placed in the dorsal lithotomy position, prepped and draped in the usual sterile fashion, and  preoperative antibiotics were administered. A preoperative time-out was performed.   A 21 French cystoscope was lubricated, passed per urethra and advanced proximally under direct vision with findings as described above.  Attention was directed to the right ureteral orifice and a 0.038 Sensor wire was then advanced up the ureter into the renal pelvis alongside the indwelling stent.  The cystoscope was removed and repassed.  The stent was grasped with endoscopic forceps and removed without difficulty.    A dual-lumen catheter was placed over the sensor wire and a retrograde pyelogram was performed which showed severe right hydronephrosis and a large partial staghorn calculus.  A second sensor wire was placed through the dual-lumen catheter.  A 12/14 French ureteral access sheath was placed over the working wire under fluoroscopic guidance without difficulty.  A dual channel digital flexible ureteroscope was placed through the access sheath and advanced into the r UPJ without difficulty.  Due to inflammatory changes of the mucosa at the UPJ the calculus was difficult to visualized.  The calculus was able to be advanced further for better visualization.  A 242 m holmium laser fiber was placed through the ureteroscope and the stone was dusted at a setting of 0.3J/50 Hz.  This portion procedure was tedious due to oozing and suboptimal visualization.  Once a portion of the stone had been treated the ureteroscope was able to be advanced more proximal with much better visualization.  Once the majority of the renal pelvic portion of the stone had been treated it migrated to the lower pole calyx.  Visualization in the dilated calyx was suboptimal and anesthesia time had been almost 2 hours.  It was elected at this point to replace the ureteral stent and complete a second stage at a later date.  The ureteral access sheath and ureteroscope were removed in tandem and the  ureter showed no evidence of injury or  perforation.  The guidewire was backloaded on the cystoscope and a 98F/24 cm Contour ureteral stent was placed under fluoroscopic guidance.  The wire was then removed with an adequate stent curl noted in the renal pelvis as well as in the bladder.  The bladder was then emptied and the procedure ended.  The patient appeared to tolerate the procedure well and without complications.  After anesthetic reversal the patient was transported to the PACU in stable condition.    Plan: Second stage ureteroscopy to be scheduled ~ 2 weeks   John Giovanni, MD

## 2022-12-13 NOTE — Interval H&P Note (Signed)
History and Physical Interval Note:  All questions were answered.  Due to stone size we discussed the possible need for a staged procedure.  CV: RRR Lungs: Clear  12/13/2022 10:39 AM  [name]  has presented today for surgery, with the diagnosis of Right Nephrolithiasis.  The various methods of treatment have been discussed with the patient and family. After consideration of risks, benefits and other options for treatment, the patient has consented to  Procedure(s): CYSTOSCOPY/URETEROSCOPY/HOLMIUM LASER/STENT EXCHANGE (Right) as a surgical intervention.  The patient's history has been reviewed, patient examined, no change in status, stable for surgery.  I have reviewed the patient's chart and labs.  Questions were answered to the patient's satisfaction.     [place]

## 2022-12-13 NOTE — Discharge Instructions (Signed)
DISCHARGE INSTRUCTIONS FOR KIDNEY STONE/URETERAL STENT   MEDICATIONS:  1. Resume all your other meds from home.  2.  AZO (over-the-counter) can help with the burning/stinging when you urinate. 3.  Hydrocodone is for moderate/severe pain if needed, Rx was sent to your pharmacy. 4.  Prescriptions for tamsulosin and oxybutynin were sent to your pharmacy which may help postop bladder irritation  ACTIVITY:  1. May resume regular activities in 24 hours. 2. No driving while on narcotic pain medications  3. Drink plenty of water  4. Continue to walk at home - you can still get blood clots when you are at home, so keep active, but don't over do it.  5. May return to work/school tomorrow or when you feel ready    SIGNS/SYMPTOMS TO CALL:  Common postoperative symptoms include urinary frequency, urgency, bladder spasm and blood in the urine  Please call us if you have a fever greater than 101.5, uncontrolled nausea/vomiting, uncontrolled pain, dizziness, unable to urinate, excessively bloody urine, chest pain, shortness of breath, leg swelling, leg pain, or any other concerns or questions.   You can reach [place] at [phone].   FOLLOW-UP:  1. You will be contacted regarding scheduling of follow-up ureteroscopy

## 2022-12-13 NOTE — Anesthesia Preprocedure Evaluation (Signed)
Anesthesia Evaluation  Patient identified by MRN, date of birth, ID band Patient awake    Reviewed: Allergy & Precautions, NPO status , Patient's Chart, lab work & pertinent test results  History of Anesthesia Complications Negative for: history of anesthetic complications  Airway Mallampati: III  TM Distance: >3 FB Neck ROM: full    Dental  (+) Chipped   Pulmonary neg pulmonary ROS   Pulmonary exam normal        Cardiovascular negative cardio ROS Normal cardiovascular exam     Neuro/Psych  PSYCHIATRIC DISORDERS Anxiety Depression    negative neurological ROS     GI/Hepatic negative GI ROS, Neg liver ROS,,,  Endo/Other  diabetes, Poorly Controlled, Oral Hypoglycemic Agents    Renal/GU Renal disease     Musculoskeletal   Abdominal   Peds  Hematology negative hematology ROS (+)   Anesthesia Other Findings Past Medical History: No date: Kidney stone No date: Meningitis     Comment:  spinal No date: OCD (obsessive compulsive disorder) No date: Shingles  Past Surgical History: No date: KIDNEY STONE SURGERY     Reproductive/Obstetrics negative OB ROS                              Anesthesia Physical Anesthesia Plan  ASA: 3  Anesthesia Plan: General LMA   Post-op Pain Management: Toradol IV (intra-op)* and Ofirmev IV (intra-op)*   Induction: Intravenous  PONV Risk Score and Plan: 2 and Dexamethasone, Ondansetron, Midazolam and Treatment may vary due to age or medical condition  Airway Management Planned: LMA  Additional Equipment:   Intra-op Plan:   Post-operative Plan: Extubation in OR  Informed Consent: I have reviewed the patients History and Physical, chart, labs and discussed the procedure including the risks, benefits and alternatives for the proposed anesthesia with the patient or authorized representative who has indicated his/her understanding and acceptance.      Dental Advisory Given  Plan Discussed with: Anesthesiologist, CRNA and Surgeon  Anesthesia Plan Comments: (Patient consented for risks of anesthesia including but not limited to:  - adverse reactions to medications - damage to eyes, teeth, lips or other oral mucosa - nerve damage due to positioning  - sore throat or hoarseness - Damage to heart, brain, nerves, lungs, other parts of body or loss of life  Patient voiced understanding.)         Anesthesia Quick Evaluation

## 2022-12-13 NOTE — Anesthesia Procedure Notes (Signed)
Procedure Name: LMA Insertion Date/Time: 12/13/2022 11:05 AM  Performed by: Otho Perl, CRNAPre-anesthesia Checklist: Patient identified, Patient being monitored, Timeout performed, Emergency Drugs available and Suction available Patient Re-evaluated:Patient Re-evaluated prior to induction Oxygen Delivery Method: Circle system utilized Preoxygenation: Pre-oxygenation with 100% oxygen Induction Type: IV induction Ventilation: Mask ventilation without difficulty LMA: LMA inserted LMA Size: 4.0 Tube type: Oral Number of attempts: 1 Placement Confirmation: positive ETCO2 and breath sounds checked- equal and bilateral Tube secured with: Tape Dental Injury: Teeth and Oropharynx as per pre-operative assessment  Difficulty Due To: Difficulty was unanticipated

## 2022-12-13 NOTE — Transfer of Care (Signed)
Immediate Anesthesia Transfer of Care Note  Patient: Dillon [name]  Procedure(s) Performed: CYSTOSCOPY/URETEROSCOPY/HOLMIUM LASER/STENT EXCHANGE (Right: Ureter)  Patient Location: PACU  Anesthesia Type:General  Level of Consciousness: awake  Airway & Oxygen Therapy: Patient Spontanous Breathing and Patient connected to face mask oxygen  Post-op Assessment: Report given to RN and Post -op Vital signs reviewed and stable  Post vital signs: Reviewed  Last Vitals:  Vitals Value Taken Time  BP 154/94 12/13/22 1309  Temp 71F   Pulse 70 12/13/22 1311  Resp 11 12/13/22 1311  SpO2 100 % 12/13/22 1311  Vitals shown include unvalidated device data.  Last Pain:  Vitals:   12/13/22 0852  TempSrc: Tympanic  PainSc: 0-No pain         Complications:  Encounter Notable Events  Notable Event Outcome Phase Comment  Difficult to intubate - unexpected  Intraprocedure Filed from anesthesia note documentation.

## 2022-12-13 NOTE — Anesthesia Postprocedure Evaluation (Signed)
Anesthesia Post Note  Patient: Dillon [name]  Procedure(s) Performed: CYSTOSCOPY/URETEROSCOPY/HOLMIUM LASER/STENT EXCHANGE (Right: Ureter)  Patient location during evaluation: PACU Anesthesia Type: General Level of consciousness: awake and alert Pain management: pain level controlled Vital Signs Assessment: post-procedure vital signs reviewed and stable Respiratory status: spontaneous breathing, nonlabored ventilation, respiratory function stable and patient connected to nasal cannula oxygen Cardiovascular status: blood pressure returned to baseline and stable Postop Assessment: no apparent nausea or vomiting Anesthetic complications: yes   Encounter Notable Events  Notable Event Outcome Phase Comment  Difficult to intubate - unexpected  Intraprocedure Filed from anesthesia note documentation.     Last Vitals:  Vitals:   12/13/22 1330 12/13/22 1345  BP: (!) 136/91 (!) 148/89  Pulse: 77 77  Resp:  16  Temp:  (!) 36.1 C  SpO2: 100% 100%    Last Pain:  Vitals:   12/13/22 0852  TempSrc: Tympanic  PainSc: 0-No pain                 Ilene Qua

## 2022-12-14 ENCOUNTER — Encounter: Payer: Self-pay | Admitting: Urology

## 2022-12-14 ENCOUNTER — Ambulatory Visit (INDEPENDENT_AMBULATORY_CARE_PROVIDER_SITE_OTHER): Payer: Medicare HMO | Admitting: Nurse Practitioner

## 2022-12-14 VITALS — BP 116/75 | HR 105 | Temp 97.8°F | Ht 69.02 in | Wt 201.8 lb

## 2022-12-14 DIAGNOSIS — F332 Major depressive disorder, recurrent severe without psychotic features: Secondary | ICD-10-CM | POA: Diagnosis not present

## 2022-12-14 DIAGNOSIS — D696 Thrombocytopenia, unspecified: Secondary | ICD-10-CM

## 2022-12-14 DIAGNOSIS — N200 Calculus of kidney: Secondary | ICD-10-CM | POA: Diagnosis not present

## 2022-12-14 DIAGNOSIS — N1330 Unspecified hydronephrosis: Secondary | ICD-10-CM

## 2022-12-14 DIAGNOSIS — E43 Unspecified severe protein-calorie malnutrition: Secondary | ICD-10-CM | POA: Diagnosis not present

## 2022-12-14 DIAGNOSIS — F422 Mixed obsessional thoughts and acts: Secondary | ICD-10-CM

## 2022-12-14 MED ORDER — TRAZODONE HCL 50 MG PO TABS: 50.0000 mg | ORAL_TABLET | Freq: Every day | ORAL | 4 refills | Status: DC

## 2022-12-14 MED ORDER — SITAGLIPTIN PHOSPHATE 100 MG PO TABS: 100.0000 mg | ORAL_TABLET | Freq: Every day | ORAL | 4 refills | Status: DC

## 2022-12-14 MED ORDER — FLUVOXAMINE MALEATE 100 MG PO TABS: 100.0000 mg | ORAL_TABLET | ORAL | 4 refills | Status: DC

## 2022-12-14 MED ORDER — OLANZAPINE 10 MG PO TABS: 10.0000 mg | ORAL_TABLET | Freq: Every day | ORAL | 4 refills | Status: DC

## 2022-12-14 NOTE — Assessment & Plan Note (Signed)
Ongoing, continue collaboration with urology -- currently no symptoms.  Recommend he continue to follow with urology, needs to schedule follow-up.

## 2022-12-14 NOTE — Assessment & Plan Note (Signed)
Refer to renal calculi plan of care.

## 2022-12-14 NOTE — Assessment & Plan Note (Signed)
Chronic, ongoing.  Denies SI/HI.  Overall mood much improved today and scores improved.  Will continue current medication regimen and collaboration with psychiatry.  Obtain EKG next visit to as do not see where he obtained recently and psychiatry had recommended/ordered.  Refills sent in.

## 2022-12-14 NOTE — Assessment & Plan Note (Signed)
Refer to depression plan of care.

## 2022-12-14 NOTE — Assessment & Plan Note (Signed)
Improving overall with mood improving -- weight gain present.  Check CMP today.

## 2022-12-14 NOTE — Progress Notes (Signed)
BP 116/75   Pulse (!) 105   Temp 97.8 F (36.6 C) (Oral)   Ht 5' 9.02" (1.753 m)   Wt 201 lb 12.8 oz (91.5 kg)   SpO2 96%   BMI 29.79 kg/m    Subjective:    Patient ID: Dillon [name], male    DOB: [birth year], 72 y.o.   MRN: [id number]  HPI: Dillon [name] is a 72 y.o. male  Chief Complaint  Patient presents with   Depression   Renal Calculi    Has procedure yesterday by Dr. Bernardo Heater   KIDNEY STONES Followed by urology with cystoscopy and stent exchange yesterday -- he reports they could not get full kidney stones out.  To continue to follow with urology, Dr. Bernardo Heater.  No current symptoms.   Urinary frequency: no Urgency: no Small volume voids: no Symptom severity: no Urinary incontinence: no Foul odor: no Hematuria: no Abdominal pain: no Back pain: no Suprapubic pain/pressure: no Flank pain: no Fever:  no  DEPRESSION Had ECT in hospital on admission 10/19/22, had three treatments.  Does notice improvement in mood, but some short term memory issues since treatment.  Saw psychiatry, Dr. Modesta Messing, on 11/29/22 -- to continue Fluvoxamine  and Olanzapine at current doses + Trazodone.  Mild low platelet recent labs in hospital. Mood status: stable Satisfied with current treatment?: yes Symptom severity: moderate  Duration of current treatment : chronic Side effects: no Medication compliance: good compliance Psychotherapy/counseling: yes in the past Previous psychiatric medications: multiple medications in past Depressed mood: no Anxious mood: no Anhedonia: no Significant weight loss or gain: no Insomnia: yes hard to fall asleep Fatigue: no Feelings of worthlessness or guilt: no Impaired concentration/indecisiveness: no Suicidal ideations: no Hopelessness: no Crying spells: no    12/14/2022    3:07 PM 11/29/2022    9:55 AM 10/11/2022   12:16 PM 04/08/2022    1:58 PM 02/10/2022   11:21 AM  Depression screen PHQ 2/9  Decreased Interest 0 1 3 0 0  Down,  Depressed, Hopeless 1 0 2 0 0  PHQ - 2 Score '1 1 5 '$ 0 0  Altered sleeping 0 0 2 2 0  Tired, decreased energy 0 '1 2 2 '$ 0  Change in appetite '1 2 3 '$ 0 0  Feeling bad or failure about yourself  0 0 2 0 0  Trouble concentrating 0 0 1 0 0  Moving slowly or fidgety/restless 0 0 1 0 0  Suicidal thoughts 0 0 0 0 0  PHQ-9 Score '2 4 16 4 '$ 0  Difficult doing work/chores Not difficult at all Not difficult at all Very difficult Not difficult at all Not difficult at all       12/14/2022    3:08 PM 11/29/2022    9:55 AM 10/11/2022   12:16 PM 04/08/2022    1:58 PM  GAD 7 : Generalized Anxiety Score  Nervous, Anxious, on Edge 0 0 3 0  Control/stop worrying 0 0 3 1  Worry too much - different things 0 0 3 1  Trouble relaxing 0 1 1 0  Restless 0 0 1 0  Easily annoyed or irritable 0 0 1 1  Afraid - awful might happen 0 0 3 0  Total GAD 7 Score 0 '1 15 3  '$ Anxiety Difficulty Not difficult at all Not difficult at all Somewhat difficult Not difficult at all   Relevant past medical, surgical, family and social history reviewed and updated as indicated. Interim medical history since  our last visit reviewed. Allergies and medications reviewed and updated.  Review of Systems  Constitutional:  Negative for activity change, diaphoresis, fatigue and fever.  Respiratory:  Negative for cough, chest tightness, shortness of breath and wheezing.   Cardiovascular:  Negative for chest pain, palpitations and leg swelling.  Gastrointestinal: Negative.   Neurological: Negative.   Psychiatric/Behavioral:  Negative for decreased concentration, self-injury, sleep disturbance and suicidal ideas. The patient is not nervous/anxious.     Per HPI unless specifically indicated above     Objective:    BP 116/75   Pulse (!) 105   Temp 97.8 F (36.6 C) (Oral)   Ht 5' 9.02" (1.753 m)   Wt 201 lb 12.8 oz (91.5 kg)   SpO2 96%   BMI 29.79 kg/m   Wt Readings from Last 3 Encounters:  12/14/22 201 lb 12.8 oz (91.5 kg)   12/13/22 190 lb (86.2 kg)  11/29/22 194 lb 6.4 oz (88.2 kg)    Physical Exam Vitals and nursing note reviewed.  Constitutional:      General: He is awake. He is not in acute distress.    Appearance: He is well-developed and well-groomed. He is obese. He is not ill-appearing.  HENT:     Head: Normocephalic and atraumatic.     Right Ear: Hearing normal. No drainage.     Left Ear: Hearing normal. No drainage.  Eyes:     General: Lids are normal.        Right eye: No discharge.        Left eye: No discharge.     Conjunctiva/sclera: Conjunctivae normal.     Pupils: Pupils are equal, round, and reactive to light.  Neck:     Thyroid: No thyromegaly.     Vascular: No carotid bruit.     Trachea: Trachea normal.  Cardiovascular:     Rate and Rhythm: Normal rate and regular rhythm.     Heart sounds: Normal heart sounds, S1 normal and S2 normal. No murmur heard.    No gallop.  Pulmonary:     Effort: Pulmonary effort is normal. No accessory muscle usage or respiratory distress.     Breath sounds: Normal breath sounds.  Abdominal:     General: Bowel sounds are normal.     Palpations: Abdomen is soft.  Musculoskeletal:        General: Normal range of motion.     Cervical back: Normal range of motion and neck supple.     Right lower leg: No edema.     Left lower leg: No edema.  Skin:    General: Skin is warm and dry.     Capillary Refill: Capillary refill takes less than 2 seconds.  Neurological:     Mental Status: He is alert and oriented to person, place, and time.     Deep Tendon Reflexes: Reflexes are normal and symmetric.  Psychiatric:        Attention and Perception: Attention normal.        Mood and Affect: Mood normal.        Speech: Speech normal.        Behavior: Behavior normal. Behavior is cooperative.        Thought Content: Thought content normal.     Results for orders placed or performed during the hospital encounter of 12/13/22  Glucose, capillary  Result  Value Ref Range   Glucose-Capillary 315 (H) 70 - 99 mg/dL   Comment 1 Notify RN    Comment  2 Document in Chart   Glucose, capillary  Result Value Ref Range   Glucose-Capillary 287 (H) 70 - 99 mg/dL  Glucose, capillary  Result Value Ref Range   Glucose-Capillary 218 (H) 70 - 99 mg/dL      Assessment & Plan:   Problem List Items Addressed This Visit       Genitourinary   Hydronephrosis of right kidney    Refer to renal calculi plan of care.      Relevant Orders   Comprehensive metabolic panel   Renal calculi    Ongoing, continue collaboration with urology -- currently no symptoms.  Recommend he continue to follow with urology, needs to schedule follow-up.      Relevant Orders   Comprehensive metabolic panel     Hematopoietic and Hemostatic   Thrombocytopenia ([place])    Noted on recent labs, recheck today.      Relevant Orders   CBC with Differential/Platelet     Other   Protein-calorie malnutrition, severe    Improving overall with mood improving -- weight gain present.  Check CMP today.      Relevant Orders   Comprehensive metabolic panel   Severe recurrent major depression without psychotic features ([place]) - Primary    Chronic, ongoing.  Denies SI/HI.  Overall mood much improved today and scores improved.  Will continue current medication regimen and collaboration with psychiatry.  Obtain EKG next visit to as do not see where he obtained recently and psychiatry had recommended/ordered.  Refills sent in.      Relevant Medications   traZODone (DESYREL) 50 MG tablet   fluvoxaMINE (LUVOX) 100 MG tablet     Follow up plan: Return in about 4 weeks (around 01/11/2023) for T2DM, HTN/HLD, MOOD.

## 2022-12-14 NOTE — Assessment & Plan Note (Signed)
Noted on recent labs, recheck today.

## 2022-12-15 ENCOUNTER — Other Ambulatory Visit: Payer: Self-pay | Admitting: Nurse Practitioner

## 2022-12-15 ENCOUNTER — Other Ambulatory Visit: Payer: Self-pay | Admitting: Urology

## 2022-12-15 ENCOUNTER — Telehealth: Payer: Self-pay

## 2022-12-15 DIAGNOSIS — N200 Calculus of kidney: Secondary | ICD-10-CM

## 2022-12-15 LAB — COMPREHENSIVE METABOLIC PANEL
ALT: 28 IU/L (ref 0–44)
AST: 11 IU/L (ref 0–40)
Albumin/Globulin Ratio: 2.7 — ABNORMAL HIGH (ref 1.2–2.2)
Albumin: 4.3 g/dL (ref 3.9–4.9)
Alkaline Phosphatase: 151 IU/L — ABNORMAL HIGH (ref 44–121)
BUN/Creatinine Ratio: 14 (ref 10–24)
BUN: 18 mg/dL (ref 8–27)
Bilirubin Total: 0.4 mg/dL (ref 0.0–1.2)
CO2: 23 mmol/L (ref 20–29)
Calcium: 9.7 mg/dL (ref 8.6–10.2)
Chloride: 102 mmol/L (ref 96–106)
Creatinine, Ser: 1.26 mg/dL (ref 0.76–1.27)
Globulin, Total: 1.6 g/dL (ref 1.5–4.5)
Glucose: 259 mg/dL — ABNORMAL HIGH (ref 70–99)
Potassium: 4.2 mmol/L (ref 3.5–5.2)
Sodium: 139 mmol/L (ref 134–144)
Total Protein: 5.9 g/dL — ABNORMAL LOW (ref 6.0–8.5)
eGFR: 62 mL/min/{1.73_m2} (ref >59)

## 2022-12-15 LAB — CBC WITH DIFFERENTIAL/PLATELET
Basophils Absolute: 0 10*3/uL (ref 0.0–0.2)
Basos: 0 %
EOS (ABSOLUTE): 0.1 10*3/uL (ref 0.0–0.4)
Eos: 1 %
Hematocrit: 50.8 % (ref 37.5–51.0)
Hemoglobin: 17.1 g/dL (ref 13.0–17.7)
Immature Grans (Abs): 0 10*3/uL (ref 0.0–0.1)
Immature Granulocytes: 0 %
Lymphocytes Absolute: 3.5 10*3/uL — ABNORMAL HIGH (ref 0.7–3.1)
Lymphs: 32 %
MCH: 31.3 pg (ref 26.6–33.0)
MCHC: 33.7 g/dL (ref 31.5–35.7)
MCV: 93 fL (ref 79–97)
Monocytes Absolute: 0.9 10*3/uL (ref 0.1–0.9)
Monocytes: 9 %
Neutrophils Absolute: 6.2 10*3/uL (ref 1.4–7.0)
Neutrophils: 58 %
Platelets: 140 10*3/uL — ABNORMAL LOW (ref 150–450)
RBC: 5.47 x10E6/uL (ref 4.14–5.80)
RDW: 12.2 % (ref 11.6–15.4)
WBC: 10.8 10*3/uL (ref 3.4–10.8)

## 2022-12-15 MED ORDER — EMPAGLIFLOZIN 10 MG PO TABS: 10.0000 mg | ORAL_TABLET | Freq: Every day | ORAL | 3 refills | Status: DC

## 2022-12-15 NOTE — Progress Notes (Signed)
Good day, please call Azael in morning and let him know labs have returned: - Platelet count, these are part of blood that help clot it, are on lower side.  We will continue to monitor this.  Could be related to recent therapy.   - Kidney and liver function are stable.  Sugar is trending up, I am going to send in a medication called Jardiance for you to start taking, along with your Januvia for diabetes as I am concerned with sugars trending up, which you had mentioned noting too.  Jardiance helps push sugar out through your urine and you may notice you pee a little more with it.  Ensure to drink plenty of water daily while taking.  Do not stop Januvia, you will take Jardiance with this.  Any questions? Keep being awesome!!  Thank you for allowing me to participate in your care.  I appreciate you. Kindest regards, Markees Carns

## 2022-12-15 NOTE — Telephone Encounter (Signed)
I spoke with Dillon [name]. We have discussed possible surgery dates and Tuesday March 12th, 2024 was agreed upon by all parties. Patient given information about surgery date, what to expect pre-operatively and post operatively.  We discussed that a Pre-Admission Testing office will be calling to set up the pre-op visit that will take place prior to surgery, and that these appointments are typically done over the phone with a Pre-Admissions RN. Informed patient that our office will communicate any additional care to be provided after surgery. Patients questions or concerns were discussed during our call. Advised to call our office should there be any additional information, questions or concerns that arise. Patient verbalized understanding.

## 2022-12-15 NOTE — Progress Notes (Signed)
Surgical Physician Order Form [facility] [place]  * Scheduling expectation : Next Available  *Length of Case: 2 hours  *Clearance needed: no  *Anticoagulation Instructions: N/A  *Aspirin Instructions: N/A  *Post-op visit Date/Instructions:   TBD  *Diagnosis: Right Nephrolithiasis  *Procedure:  Right   Ureteroscopy w/laser lithotripsy & stent exchange [id number])   Additional orders: N/A  -Admit type: OUTpatient  -Anesthesia: General  -VTE Prophylaxis Standing Order SCD's       Other:   -Standing Lab Orders Per Anesthesia    Lab other: UA&Urine Culture  -Standing Test orders EKG/Chest x-ray per Anesthesia       Test other:   - Medications:  Ceftriaxone(Rocephin) 1gm IV  -Other orders:  N/A

## 2022-12-15 NOTE — Progress Notes (Signed)
   [facility] From  Surgery Date: Date: 12/27/2022  Surgeon: Dr. John Giovanni, MD  Inpt ( No  )   Outpt (Yes)   Obs ( No  )   Diagnosis: N20.0 Right Nephrolithiasis  -CPT: [phone]  Surgery: Right Ureteroscopy with Laser Lithotripsy and Stent Exchange  Stop Anticoagulations: N/A  Cardiac/Medical/Pulmonary Clearance needed: no  *Orders entered into EPIC  Date: 12/15/22   *Case booked in [place]  Date: 12/15/22  *Notified pt of Surgery: Date: 12/15/22  PRE-OP UA & CX: yes, will obtain in clinic on 12/19/2022  *Placed into Prior Authorization Work Fabio Bering Date: 12/15/22  Assistant/laser/rep:No

## 2022-12-16 ENCOUNTER — Telehealth: Payer: Self-pay

## 2022-12-16 NOTE — Telephone Encounter (Signed)
Pt given lab results per notes of Odette Fraction NP on 12/16/22.Marland Kitchen Pt verbalized understanding.

## 2022-12-19 ENCOUNTER — Other Ambulatory Visit: Payer: Medicare HMO

## 2022-12-19 DIAGNOSIS — N200 Calculus of kidney: Secondary | ICD-10-CM

## 2022-12-19 LAB — URINALYSIS, COMPLETE
Bilirubin, UA: NEGATIVE
Ketones, UA: NEGATIVE
Leukocytes,UA: NEGATIVE
Nitrite, UA: NEGATIVE
Specific Gravity, UA: 1.015 (ref 1.005–1.030)
Urobilinogen, Ur: 0.2 mg/dL (ref 0.2–1.0)
pH, UA: 5 (ref 5.0–7.5)

## 2022-12-19 LAB — MICROSCOPIC EXAMINATION: RBC, Urine: >30 /hpf — AB (ref 0–2)

## 2022-12-21 ENCOUNTER — Encounter
Admission: RE | Admit: 2022-12-21 | Discharge: 2022-12-21 | Disposition: A | Discharge status: Home / Self Care | Payer: Medicare HMO | Source: Ambulatory Visit | Attending: Urology | Admitting: Urology

## 2022-12-21 ENCOUNTER — Other Ambulatory Visit: Payer: Self-pay

## 2022-12-21 VITALS — Ht 69.0 in | Wt 201.8 lb

## 2022-12-21 DIAGNOSIS — E119 Type 2 diabetes mellitus without complications: Secondary | ICD-10-CM

## 2022-12-21 HISTORY — DX: Personal history of urinary calculi: Z87.442

## 2022-12-21 NOTE — Pre-Procedure Instructions (Addendum)
Pre admit testing phone interview and patient instructions given  over the phone. Patient verbalized understanding but unable to pick the printed instructions. Per patient he had this kind of procedure done in the past so he knows what to expect.  Instructed patient  to call pre -admit office for any questions regarding instructions.

## 2022-12-21 NOTE — Patient Instructions (Addendum)
Your procedure is scheduled on: 12/27/2022  Report to the Registration Desk on the 1st floor of the [facility]. To find out your arrival time, please call [phone] between 1PM - 3PM on: 12/26/2022  If your arrival time is 6:00 am, do not arrive before that time as the [facility] entrance doors do not open until 6:00 am.  REMEMBER: Instructions that are not followed completely may result in serious medical risk, up to and including death; or upon the discretion of your surgeon and anesthesiologist your surgery may need to be rescheduled.  Do not eat food after midnight the night before surgery.  No gum chewing or hard candies.    One week prior to surgery: Stop Anti-inflammatories (NSAIDS) such as Advil, Aleve, Ibuprofen, Motrin, Naproxen, Naprosyn and Aspirin based products such as Excedrin, Goody's Powder, BC Powder. Stop ANY OVER THE COUNTER supplements until after surgery like (VITAMIN B12) ,   You may however, continue to take Tylenol if needed for pain up until the day of surgery.  Continue taking all prescribed medications with the exception of the following:    Do not take empagliflozin (JARDIANCE) on 3/9,3/10,3/11    Do not take sitaGLIPtin (JANUVIA) on day of surgery   TAKE ONLY THESE MEDICATIONS THE MORNING OF SURGERY WITH A SIP OF WATER:  tamsulosin (FLOMAX  oxybutynin (DITROPAN)  fluvoxaMINE (LUVOX)     No Alcohol for 24 hours before or after surgery.  No Smoking including e-cigarettes for 24 hours before surgery.  No chewable tobacco products for at least 6 hours before surgery.  No nicotine patches on the day of surgery.  Do not use any "recreational" drugs for at least a week (preferably 2 weeks) before your surgery.  Please be advised that the combination of cocaine and anesthesia may have negative outcomes, up to and including death. If you test positive for cocaine, your surgery will be cancelled.  On the morning of surgery brush your teeth with  toothpaste and water, you may rinse your mouth with mouthwash if you wish. Do not swallow any toothpaste or mouthwash.  You need to shower on day of surgery.  Do not wear jewelry, make-up, hairpins, clips or nail polish.  Do not wear lotions, powders, or perfumes.   Do not shave body hair from the neck down 48 hours before surgery.  Contact lenses, hearing aids and dentures may not be worn into surgery.  Do not bring valuables to the hospital. [facility] is not responsible for any missing/lost belongings or valuables.   Notify your doctor if there is any change in your medical condition (cold, fever, infection).  Wear comfortable clothing (specific to your surgery type) to the hospital.  After surgery, you can help prevent lung complications by doing breathing exercises.  Take deep breaths and cough every 1-2 hours. Your doctor may order a device called an Incentive Spirometer to help you take deep breaths.   If you are being discharged the day of surgery, you will not be allowed to drive home. You will need a responsible individual to drive you home and stay with you for 24 hours after surgery.    Please call the [facility] Dept. at [phone] if you have any questions about these instructions.  Surgery Visitation Policy:  Patients undergoing a surgery or procedure may have two family members or support persons with them as long as the person is not COVID-19 positive or experiencing its symptoms.    Due to an increase  in RSV and influenza rates and associated hospitalizations, children ages 60 and under will not be able to visit patients in [facility]. Masks continue to be strongly recommended.

## 2022-12-23 ENCOUNTER — Other Ambulatory Visit: Payer: Self-pay | Admitting: Urology

## 2022-12-23 ENCOUNTER — Telehealth: Payer: Self-pay | Admitting: Family Medicine

## 2022-12-23 DIAGNOSIS — N200 Calculus of kidney: Secondary | ICD-10-CM

## 2022-12-23 LAB — CULTURE, URINE COMPREHENSIVE

## 2022-12-23 MED ORDER — NITROFURANTOIN MONOHYD MACRO 100 MG PO CAPS: 100.0000 mg | ORAL_CAPSULE | Freq: Two times a day (BID) | ORAL | 0 refills | Status: DC

## 2022-12-23 NOTE — Telephone Encounter (Signed)
Err

## 2022-12-23 NOTE — Telephone Encounter (Signed)
Patient notified and voiced understanding. ABX sent to pharmacy.  

## 2022-12-23 NOTE — Telephone Encounter (Signed)
-----   Message from Nori Riis, PA-C sent at 12/23/2022 11:47 AM EST ----- Please let [name] know that his urine culture was positive for infection and he needs to start Macrobid 100 mg twice daily for seven days - starting to day.

## 2022-12-23 NOTE — Progress Notes (Signed)
I called it in for him.

## 2022-12-26 MED ORDER — CHLORHEXIDINE GLUCONATE 0.12 % MT SOLN: 15.0000 mL | Freq: Once | OROMUCOSAL | Status: AC

## 2022-12-26 MED ORDER — ORAL CARE MOUTH RINSE: 15.0000 mL | Freq: Once | OROMUCOSAL | Status: AC

## 2022-12-26 MED ORDER — SODIUM CHLORIDE 0.9 % IV SOLN: INTRAVENOUS | Status: DC

## 2022-12-26 MED ORDER — SODIUM CHLORIDE 0.9 % IV SOLN
1.0000 g | INTRAVENOUS | Status: AC
  Administered 2022-12-27: 1 g via INTRAVENOUS
  Filled 2022-12-26: qty 1

## 2022-12-26 MED ORDER — FAMOTIDINE 20 MG PO TABS: 20.0000 mg | ORAL_TABLET | Freq: Once | ORAL | Status: AC

## 2022-12-27 ENCOUNTER — Encounter
Admission: RE | Disposition: A | Discharge status: Home / Self Care | Payer: Self-pay | Source: Home / Self Care | Attending: Urology

## 2022-12-27 ENCOUNTER — Encounter: Payer: Self-pay | Admitting: Urology

## 2022-12-27 ENCOUNTER — Other Ambulatory Visit: Payer: Self-pay

## 2022-12-27 ENCOUNTER — Ambulatory Visit: Payer: Medicare HMO | Admitting: Certified Registered"

## 2022-12-27 ENCOUNTER — Ambulatory Visit: Payer: Medicare HMO

## 2022-12-27 ENCOUNTER — Ambulatory Visit
Admission: RE | Admit: 2022-12-27 | Discharge: 2022-12-27 | Disposition: A | Discharge status: Home / Self Care | Payer: Medicare HMO | Attending: Urology | Admitting: Urology

## 2022-12-27 DIAGNOSIS — Z87442 Personal history of urinary calculi: Secondary | ICD-10-CM | POA: Insufficient documentation

## 2022-12-27 DIAGNOSIS — E785 Hyperlipidemia, unspecified: Secondary | ICD-10-CM | POA: Diagnosis not present

## 2022-12-27 DIAGNOSIS — N200 Calculus of kidney: Secondary | ICD-10-CM | POA: Diagnosis not present

## 2022-12-27 DIAGNOSIS — N401 Enlarged prostate with lower urinary tract symptoms: Secondary | ICD-10-CM | POA: Insufficient documentation

## 2022-12-27 DIAGNOSIS — N132 Hydronephrosis with renal and ureteral calculous obstruction: Secondary | ICD-10-CM | POA: Insufficient documentation

## 2022-12-27 DIAGNOSIS — E119 Type 2 diabetes mellitus without complications: Secondary | ICD-10-CM | POA: Insufficient documentation

## 2022-12-27 DIAGNOSIS — N1330 Unspecified hydronephrosis: Secondary | ICD-10-CM

## 2022-12-27 HISTORY — PX: CYSTOSCOPY/URETEROSCOPY/HOLMIUM LASER/STENT PLACEMENT: SHX6546

## 2022-12-27 LAB — GLUCOSE, CAPILLARY
Glucose-Capillary: 253 mg/dL — ABNORMAL HIGH (ref 70–99)
Glucose-Capillary: 238 mg/dL — ABNORMAL HIGH (ref 70–99)

## 2022-12-27 SURGERY — CYSTOSCOPY/URETEROSCOPY/HOLMIUM LASER/STENT PLACEMENT
Anesthesia: General | Laterality: Right

## 2022-12-27 MED ORDER — ACETAMINOPHEN 10 MG/ML IV SOLN: 1000.0000 mg | Freq: Once | INTRAVENOUS | Status: DC | PRN

## 2022-12-27 MED ORDER — LIDOCAINE HCL (CARDIAC) PF 100 MG/5ML IV SOSY
PREFILLED_SYRINGE | INTRAVENOUS | Status: DC | PRN
  Administered 2022-12-27: 100 mg via INTRAVENOUS

## 2022-12-27 MED ORDER — CHLORHEXIDINE GLUCONATE 0.12 % MT SOLN
OROMUCOSAL | Status: AC
  Administered 2022-12-27: 15 mL via OROMUCOSAL
  Filled 2022-12-27: qty 15

## 2022-12-27 MED ORDER — FENTANYL CITRATE (PF) 100 MCG/2ML IJ SOLN
INTRAMUSCULAR | Status: AC
  Filled 2022-12-27: qty 2

## 2022-12-27 MED ORDER — HYDROCODONE-ACETAMINOPHEN 5-325 MG PO TABS: 1.0000 | ORAL_TABLET | Freq: Four times a day (QID) | ORAL | 0 refills | Status: DC | PRN

## 2022-12-27 MED ORDER — FENTANYL CITRATE (PF) 100 MCG/2ML IJ SOLN
INTRAMUSCULAR | Status: DC | PRN
  Administered 2022-12-27: 50 ug via INTRAVENOUS

## 2022-12-27 MED ORDER — OXYBUTYNIN CHLORIDE 5 MG PO TABS: ORAL_TABLET | ORAL | 1 refills | Status: DC

## 2022-12-27 MED ORDER — MIDAZOLAM HCL 2 MG/2ML IJ SOLN
INTRAMUSCULAR | Status: AC
  Filled 2022-12-27: qty 2

## 2022-12-27 MED ORDER — TAMSULOSIN HCL 0.4 MG PO CAPS: 0.4000 mg | ORAL_CAPSULE | Freq: Every day | ORAL | 0 refills | Status: DC

## 2022-12-27 MED ORDER — IOHEXOL 180 MG/ML  SOLN
INTRAMUSCULAR | Status: DC | PRN
  Administered 2022-12-27: 10 mL

## 2022-12-27 MED ORDER — KETAMINE HCL 50 MG/5ML IJ SOSY
PREFILLED_SYRINGE | INTRAMUSCULAR | Status: AC
  Filled 2022-12-27: qty 5

## 2022-12-27 MED ORDER — METOPROLOL TARTRATE 5 MG/5ML IV SOLN
INTRAVENOUS | Status: DC | PRN
  Administered 2022-12-27 (×2): 1.5 mg via INTRAVENOUS

## 2022-12-27 MED ORDER — DEXAMETHASONE SODIUM PHOSPHATE 10 MG/ML IJ SOLN
INTRAMUSCULAR | Status: DC | PRN
  Administered 2022-12-27: 5 mg via INTRAVENOUS

## 2022-12-27 MED ORDER — DEXAMETHASONE SODIUM PHOSPHATE 10 MG/ML IJ SOLN
INTRAMUSCULAR | Status: AC
  Filled 2022-12-27: qty 1

## 2022-12-27 MED ORDER — SUCCINYLCHOLINE CHLORIDE 200 MG/10ML IV SOSY
PREFILLED_SYRINGE | INTRAVENOUS | Status: AC
  Filled 2022-12-27: qty 10

## 2022-12-27 MED ORDER — OXYCODONE HCL 5 MG PO TABS: 5.0000 mg | ORAL_TABLET | Freq: Once | ORAL | Status: DC | PRN

## 2022-12-27 MED ORDER — MIDAZOLAM HCL 2 MG/2ML IJ SOLN
INTRAMUSCULAR | Status: DC | PRN
  Administered 2022-12-27: 1 mg via INTRAVENOUS

## 2022-12-27 MED ORDER — OXYCODONE HCL 5 MG/5ML PO SOLN: 5.0000 mg | Freq: Once | ORAL | Status: DC | PRN

## 2022-12-27 MED ORDER — FENTANYL CITRATE (PF) 100 MCG/2ML IJ SOLN: 25.0000 ug | INTRAMUSCULAR | Status: DC | PRN

## 2022-12-27 MED ORDER — PROMETHAZINE HCL 25 MG/ML IJ SOLN: 6.2500 mg | INTRAMUSCULAR | Status: DC | PRN

## 2022-12-27 MED ORDER — METOPROLOL TARTRATE 5 MG/5ML IV SOLN
INTRAVENOUS | Status: AC
  Filled 2022-12-27: qty 5

## 2022-12-27 MED ORDER — ROCURONIUM BROMIDE 10 MG/ML (PF) SYRINGE
PREFILLED_SYRINGE | INTRAVENOUS | Status: AC
  Filled 2022-12-27: qty 10

## 2022-12-27 MED ORDER — ONDANSETRON HCL 4 MG/2ML IJ SOLN
INTRAMUSCULAR | Status: AC
  Filled 2022-12-27: qty 2

## 2022-12-27 MED ORDER — PROPOFOL 10 MG/ML IV BOLUS
INTRAVENOUS | Status: DC | PRN
  Administered 2022-12-27: 150 mg via INTRAVENOUS

## 2022-12-27 MED ORDER — PROPOFOL 10 MG/ML IV BOLUS
INTRAVENOUS | Status: AC
  Filled 2022-12-27: qty 20

## 2022-12-27 MED ORDER — DROPERIDOL 2.5 MG/ML IJ SOLN: 0.6250 mg | Freq: Once | INTRAMUSCULAR | Status: DC | PRN

## 2022-12-27 MED ORDER — KETAMINE HCL 10 MG/ML IJ SOLN
INTRAMUSCULAR | Status: DC | PRN
  Administered 2022-12-27: 20 mg via INTRAVENOUS
  Administered 2022-12-27: 30 mg via INTRAVENOUS

## 2022-12-27 MED ORDER — FAMOTIDINE 20 MG PO TABS
ORAL_TABLET | ORAL | Status: AC
  Administered 2022-12-27: 20 mg via ORAL
  Filled 2022-12-27: qty 1

## 2022-12-27 MED ORDER — ONDANSETRON HCL 4 MG/2ML IJ SOLN
INTRAMUSCULAR | Status: DC | PRN
  Administered 2022-12-27: 4 mg via INTRAVENOUS

## 2022-12-27 MED ORDER — LIDOCAINE HCL (PF) 2 % IJ SOLN
INTRAMUSCULAR | Status: AC
  Filled 2022-12-27: qty 5

## 2022-12-27 MED ORDER — SUCCINYLCHOLINE CHLORIDE 200 MG/10ML IV SOSY
PREFILLED_SYRINGE | INTRAVENOUS | Status: DC | PRN
  Administered 2022-12-27: 100 mg via INTRAVENOUS

## 2022-12-27 SURGICAL SUPPLY — 31 items
BAG DRAIN SIEMENS DORNER NS (MISCELLANEOUS) ×1 IMPLANT
BAG DRN NS LF (MISCELLANEOUS) ×1
BASKET ZERO TIP 1.9FR (BASKET) IMPLANT
BRUSH SCRUB EZ 1% IODOPHOR (MISCELLANEOUS) ×1 IMPLANT
BSKT STON RTRVL ZERO TP 1.9FR (BASKET)
CATH URET FLEX-TIP 2 LUMEN 10F (CATHETERS) IMPLANT
CATH URETL OPEN END 6X70 (CATHETERS) IMPLANT
CNTNR URN SCR LID CUP LEK RST (MISCELLANEOUS) IMPLANT
CONT SPEC 4OZ STRL OR WHT (MISCELLANEOUS)
DRAPE UTILITY 15X26 TOWEL STRL (DRAPES) ×1 IMPLANT
FIBER LASER MOSES 200 DFL (Laser) IMPLANT
GLOVE SURG UNDER POLY LF SZ7.5 (GLOVE) ×1 IMPLANT
GOWN STRL REUS W/ TWL LRG LVL3 (GOWN DISPOSABLE) ×1 IMPLANT
GOWN STRL REUS W/ TWL XL LVL3 (GOWN DISPOSABLE) ×1 IMPLANT
GOWN STRL REUS W/TWL LRG LVL3 (GOWN DISPOSABLE) ×1
GOWN STRL REUS W/TWL XL LVL3 (GOWN DISPOSABLE) ×1
GUIDEWIRE STR DUAL SENSOR (WIRE) ×1 IMPLANT
IV NS IRRIG 3000ML ARTHROMATIC (IV SOLUTION) ×1 IMPLANT
KIT TURNOVER CYSTO (KITS) ×1 IMPLANT
PACK CYSTO AR (MISCELLANEOUS) ×1 IMPLANT
SCOPE LITHOVUE DISP (UROLOGICAL SUPPLIES) IMPLANT
SCOPE LITHOVUE DISPOSABLE (UROLOGICAL SUPPLIES) ×1
SET CYSTO W/LG BORE CLAMP LF (SET/KITS/TRAYS/PACK) ×1 IMPLANT
SHEATH NAVIGATOR HD 12/14X36 (SHEATH) IMPLANT
SHEATH NAVIGATOR HD 12/14X46 (SHEATH) IMPLANT
STENT URET 6FRX24 CONTOUR (STENTS) IMPLANT
STENT URET 6FRX26 CONTOUR (STENTS) IMPLANT
SURGILUBE 2OZ TUBE FLIPTOP (MISCELLANEOUS) ×1 IMPLANT
TRAP FLUID SMOKE EVACUATOR (MISCELLANEOUS) ×1 IMPLANT
VALVE UROSEAL ADJ ENDO (VALVE) IMPLANT
WATER STERILE IRR 500ML POUR (IV SOLUTION) ×1 IMPLANT

## 2022-12-27 NOTE — Op Note (Signed)
Preoperative diagnosis: Right nephrolithiasis   Postoperative diagnosis: Right nephrolithiasis  Procedure:  Cystoscopy Right ureteroscopy and stone removal Ureteroscopic laser lithotripsy Right ureteral stent exchange (44F/26 cm)  Right retrograde pyelography with interpretation Intraoperative fluoroscopy < 30 minutes  Surgeon: Nicki Reaper C. Fate Galanti, M.D.  Anesthesia: General  Complications: None  Intraoperative findings:  1.  Cystoscopy-wide caliber bulbar urethral stricture; prostate with moderate lateral lobe enlargement.  Inflammatory changes right UO secondary to indwelling stent.  No solid or papillary lesions 2.  Ureteropyeloscopy-ureter with normal mucosa; no stone fragments identified.  Fragmented calculus lower pole calyx.  Severe calyceal dilation secondary to chronic obstruction. 3.  Right retrograde pyelogram-filling defect lower calyx consistent with known calculus  EBL: Minimal  Specimens: Calculus fragments for analysis   Indication: Dillon [name] is a 72 y.o. with a 3 cm partial staghorn calculus who underwent initial ureteroscopy 12/13/2022.  He presents for follow-up ureteroscopy as part of a staged procedure.  After reviewing the management options for treatment, the patient elected to proceed with the above surgical procedure(s). We have discussed the potential benefits and risks of the procedure, side effects of the proposed treatment, the likelihood of the patient achieving the goals of the procedure, and any potential problems that might occur during the procedure or recuperation. Informed consent has been obtained.  Description of procedure:  The patient was taken to the operating room and general anesthesia was induced.  The patient was placed in the dorsal lithotomy position, prepped and draped in the usual sterile fashion, and preoperative antibiotics were administered. A preoperative time-out was performed.   A 21 French cystoscope was lubricated,  passed per urethra and advanced proximally under direct vision with findings as described above  The indwelling right ureteral stent was grasped with endoscopic forceps and brought out through the urethral meatus. A 0.038 Sensor wire was then advanced through the stent into the renal pelvis under fluoroscopic guidance.  The cystoscope was removed and a dual-lumen catheter was placed over the Sensor wire.  Retrograde pyelogram was performed with findings as described above.  A second Sensor wire was placed in a similar fashion.  A 12/14 French ureteral access sheath was placed over the working wire under fluoroscopic guidance without difficulty.  A LithoVue Elite flexible disposable ureteroscope was placed through the access sheath.  Pyeloscopy was performed with findings described above.  No stone fragments were identified in upper or middle calyces.  A 200 m Moses holmium laser fiber was placed through the ureteroscope and the stone was dusted at a setting of 0.3J/120 Hz.  As the calculus decreased in size settings were changed to 0.3J/80 Hz.  Once completely fragmented a 1.9 French 0 tip nitinol basket was placed through the ureteroscope and the fragment was removed for analysis.  The holmium laser fiber was replaced and noncontact laser lithotripsy was performed at a setting of 0.6J/40 Hz until no significant sized fragments were visualized.  All calyces were again examined and no significant/fragments were identified under direct vision or on fluoroscopy.  The ureteral access sheath and ureteroscope were removed in tandem and the ureter showed no evidence of injury or perforation.  A 44F/26 cm Contour ureteral stent was placed under fluoroscopic guidance.  The wire was then removed with an adequate stent curl noted in the renal pelvis as well as in the bladder.  The bladder was then emptied and the procedure ended.  The patient appeared to tolerate the procedure well and without complications.   After anesthetic  reversal the patient was transported to the PACU in stable condition.   Plan: KUB in 1 week and once reviewed he will be scheduled for cystoscopy with stent removal   John Giovanni, MD

## 2022-12-27 NOTE — Transfer of Care (Signed)
Immediate Anesthesia Transfer of Care Note  Patient: Dillon [name]  Procedure(s) Performed: CYSTOSCOPY/URETEROSCOPY/HOLMIUM LASER/STENT EXCHANGE (Right)  Patient Location: PACU  Anesthesia Type:General  Level of Consciousness: awake and drowsy  Airway & Oxygen Therapy: Patient Spontanous Breathing and Patient connected to nasal cannula oxygen  Post-op Assessment: Report given to RN and Post -op Vital signs reviewed and stable  Post vital signs: Reviewed and stable  Last Vitals:  Vitals Value Taken Time  BP 145/88 12/27/22 1315  Temp    Pulse 90 12/27/22 1316  Resp 16 12/27/22 1316  SpO2 98 % 12/27/22 1316  Vitals shown include unvalidated device data.  Last Pain:  Vitals:   12/27/22 1116  TempSrc: Temporal  PainSc: 0-No pain         Complications: No notable events documented.

## 2022-12-27 NOTE — Anesthesia Procedure Notes (Signed)
Procedure Name: Intubation Date/Time: 12/27/2022 11:50 AM  Performed by: Fredderick Phenix, CRNAPre-anesthesia Checklist: Patient identified, Emergency Drugs available, Suction available and Patient being monitored Patient Re-evaluated:Patient Re-evaluated prior to induction Oxygen Delivery Method: Circle system utilized Preoxygenation: Pre-oxygenation with 100% oxygen Induction Type: IV induction Ventilation: Mask ventilation without difficulty Laryngoscope Size: Glidescope and 3 Grade View: Grade I Tube type: Oral Tube size: 7.0 mm Number of attempts: 1 Airway Equipment and Method: Stylet and Oral airway Placement Confirmation: ETT inserted through vocal cords under direct vision, positive ETCO2 and breath sounds checked- equal and bilateral Secured at: 21 cm Tube secured with: Tape Dental Injury: Teeth and Oropharynx as per pre-operative assessment

## 2022-12-27 NOTE — H&P (Signed)
   11/25/2022 11:36 AM   Dillon [name] 04-May-1951 [id number]  Referring provider: No referring provider defined for this encounter.   HPI: 72 y.o. male presents for a completion of stage ureteroscopy for a right partial staghorn calculus with renal atrophy and differential renal function of 29%.  First stage was performed 12/13/2022.  He has no complaints   PMH: Past Medical History:  Diagnosis Date   Anomalies of urachus, congenital    BPH with urinary obstruction    Depression    Diabetes mellitus without complication ([place])    History of kidney stones    Hydronephrosis    Hyperlipidemia    Hypernatremia    Hypokalemia    Kidney stone    Macular degeneration    Meningitis    spinal   OCD (obsessive compulsive disorder)    Protein-calorie malnutrition, severe ([facility])    Shingles    Thrombocytopenia ([place])     Surgical History: Past Surgical History:  Procedure Laterality Date   CYSTOSCOPY W/ URETERAL STENT PLACEMENT Right 10/12/2022   Procedure: CYSTOSCOPY WITH RETROGRADE PYELOGRAM/URETERAL STENT PLACEMENT;  Surgeon: Abbie Sons, MD;  Location: [facility] ORS;  Service: Urology;  Laterality: Right;   CYSTOSCOPY/URETEROSCOPY/HOLMIUM LASER/STENT PLACEMENT Right 12/13/2022   Procedure: CYSTOSCOPY/URETEROSCOPY/HOLMIUM LASER/STENT EXCHANGE;  Surgeon: Abbie Sons, MD;  Location: [facility] ORS;  Service: Urology;  Laterality: Right;   KIDNEY STONE SURGERY      Home Medications:  Reviewed   Allergies:  Allergies  Allergen Reactions   Codeine Other (See Comments)    Unsure of reaction    Family History: Family History  Problem Relation Age of Onset   Cancer Mother        bladder   Emphysema Father    Emphysema Sister    COPD Sister    Cancer Brother        prostate    Social History:  reports that he has never smoked. He has never used smokeless tobacco. He reports that he does not drink alcohol and does not use drugs.   Physical Exam: BP (!) 148/103    Pulse (!) 114   Temp (!) 97.5 F (36.4 C) (Temporal)   Resp 18   Ht 5\' 9"  (1.753 m)   Wt 91.5 kg   SpO2 99%   BMI 29.80 kg/m   Constitutional:  Alert, No acute distress. HEENT: [place] AT Respiratory: Normal respiratory effort, no increased work of breathing. Psychiatric: Normal mood and affect.   Assessment & Plan:    1.  Right nephrolithiasis Presents for completion of staged ureteroscopy for a 3 cm partial staghorn calculus. All questions were answered and he desires to proceed   Abbie Sons, [facility] [address] [place], [place] [address] [phone]

## 2022-12-27 NOTE — Discharge Instructions (Addendum)
AMBULATORY SURGERY  DISCHARGE INSTRUCTIONS   The drugs that you were given will stay in your system until tomorrow so for the next 24 hours you should not:  Drive an automobile Make any legal decisions Drink any alcoholic beverage   You may resume regular meals tomorrow.  Today it is better to start with liquids and gradually work up to solid foods.  You may eat anything you prefer, but it is better to start with liquids, then soup and crackers, and gradually work up to solid foods.   Please notify your doctor immediately if you have any unusual bleeding, trouble breathing, redness and pain at the surgery site, drainage, fever, or pain not relieved by medication.     Your post-operative visit with Dr.                                       is: Date:                        Time:    Please call to schedule your post-operative visit.  Additional Instructions:  DISCHARGE INSTRUCTIONS FOR KIDNEY STONE/URETERAL STENT   MEDICATIONS:  1. Resume all your other meds from home.  2.  AZO (over-the-counter) can help with the burning/stinging when you urinate. 3.  Hydrocodone is for moderate/severe pain, Rx was sent to your pharmacy. 4.  Oxybutynin and tamsulosin are for stent/bladder irritation.  Refills were sent to your pharmacy  ACTIVITY:  1. May resume regular activities in 24 hours. 2. No driving while on narcotic pain medications  3. Drink plenty of water  4. Continue to walk at home - you can still get blood clots when you are at home, so keep active, but don't over do it.  5. May return to work/school tomorrow or when you feel ready   SIGNS/SYMPTOMS TO CALL:  Common postoperative symptoms include urinary frequency, urgency, bladder spasm and blood in the urine  Please call us if you have a fever greater than 101.5, uncontrolled nausea/vomiting, uncontrolled pain, dizziness, unable to urinate, excessively bloody urine, chest pain, shortness of breath, leg swelling, leg pain,  or any other concerns or questions.   You can reach [place] at [phone].   FOLLOW-UP:  1. You will be contacted for a follow-up x-ray in 1 week.  Once this is reviewed you will be scheduled for stent removal

## 2022-12-27 NOTE — Interval H&P Note (Signed)
History and Physical Interval Note:  CV: RRR Lungs: Clear  12/27/2022 11:40 AM  [name]  has presented today for surgery, with the diagnosis of Right Nephrolithiasis.  The various methods of treatment have been discussed with the patient and family. After consideration of risks, benefits and other options for treatment, the patient has consented to  Procedure(s): CYSTOSCOPY/URETEROSCOPY/HOLMIUM LASER/STENT EXCHANGE (Right) as a surgical intervention.  The patient's history has been reviewed, patient examined, no change in status, stable for surgery.  I have reviewed the patient's chart and labs.  Questions were answered to the patient's satisfaction.     [place]

## 2022-12-27 NOTE — Anesthesia Preprocedure Evaluation (Signed)
Anesthesia Evaluation  Patient identified by MRN, date of birth, ID band Patient awake    Reviewed: Allergy & Precautions, H&P , NPO status , Patient's Chart, lab work & pertinent test results, reviewed documented beta blocker date and time   Airway Mallampati: II  TM Distance: >3 FB Neck ROM: full    Dental  (+) Teeth Intact   Pulmonary neg pulmonary ROS   Pulmonary exam normal        Cardiovascular Exercise Tolerance: Good negative cardio ROS Normal cardiovascular exam Rate:Normal     Neuro/Psych  PSYCHIATRIC DISORDERS Anxiety Depression    negative neurological ROS     GI/Hepatic negative GI ROS, Neg liver ROS,,,  Endo/Other  negative endocrine ROSdiabetes, Well Controlled    Renal/GU Renal disease  negative genitourinary   Musculoskeletal   Abdominal   Peds  Hematology negative hematology ROS (+)   Anesthesia Other Findings   Reproductive/Obstetrics negative OB ROS                             Anesthesia Physical Anesthesia Plan  ASA: 3  Anesthesia Plan: General LMA   Post-op Pain Management:    Induction:   PONV Risk Score and Plan: 3  Airway Management Planned:   Additional Equipment:   Intra-op Plan:   Post-operative Plan:   Informed Consent: I have reviewed the patients History and Physical, chart, labs and discussed the procedure including the risks, benefits and alternatives for the proposed anesthesia with the patient or authorized representative who has indicated his/her understanding and acceptance.       Plan Discussed with: CRNA  Anesthesia Plan Comments:        Anesthesia Quick Evaluation

## 2022-12-28 ENCOUNTER — Encounter: Payer: Self-pay | Admitting: Urology

## 2022-12-28 ENCOUNTER — Other Ambulatory Visit: Payer: Self-pay

## 2022-12-28 DIAGNOSIS — N200 Calculus of kidney: Secondary | ICD-10-CM

## 2022-12-29 ENCOUNTER — Encounter: Payer: Medicare HMO | Admitting: Urology

## 2022-12-29 NOTE — Anesthesia Postprocedure Evaluation (Signed)
Anesthesia Post Note  Patient: Dillon [name]  Procedure(s) Performed: CYSTOSCOPY/URETEROSCOPY/HOLMIUM LASER/STENT EXCHANGE (Right)  Patient location during evaluation: PACU Anesthesia Type: General Level of consciousness: awake and alert Pain management: pain level controlled Vital Signs Assessment: post-procedure vital signs reviewed and stable Respiratory status: spontaneous breathing, nonlabored ventilation, respiratory function stable and patient connected to nasal cannula oxygen Cardiovascular status: blood pressure returned to baseline and stable Postop Assessment: no apparent nausea or vomiting Anesthetic complications: no   No notable events documented.   Last Vitals:  Vitals:   12/27/22 1430 12/27/22 1444  BP: (!) 139/92 (!) 152/94  Pulse: 84 83  Resp: (!) 24 20  Temp: 36.5 C 36.9 C  SpO2: 94% 99%    Last Pain:  Vitals:   12/27/22 1444  TempSrc: Temporal  PainSc: 0-No pain                 Molli Barrows

## 2023-01-03 ENCOUNTER — Ambulatory Visit
Admission: RE | Admit: 2023-01-03 | Discharge: 2023-01-03 | Disposition: A | Discharge status: Home / Self Care | Payer: Medicare HMO | Attending: Urology | Admitting: Urology

## 2023-01-03 ENCOUNTER — Ambulatory Visit
Admission: RE | Admit: 2023-01-03 | Discharge: 2023-01-03 | Disposition: A | Discharge status: Home / Self Care | Payer: Medicare HMO | Source: Ambulatory Visit | Attending: Urology | Admitting: Urology

## 2023-01-03 DIAGNOSIS — N200 Calculus of kidney: Secondary | ICD-10-CM | POA: Insufficient documentation

## 2023-01-03 LAB — CALCULI, WITH PHOTOGRAPH (CLINICAL LAB)
Calcium Oxalate Monohydrate: 40 %
Uric Acid Calculi: 60 %
Weight Calculi: 8 mg

## 2023-01-07 NOTE — Patient Instructions (Addendum)
Stop Jardiance 10 MG daily and start 25 MG daily.  Diabetes Mellitus Basics  Diabetes mellitus, or diabetes, is a long-term (chronic) disease. It occurs when the body does not properly use sugar (glucose) that is released from food after you eat. Diabetes mellitus may be caused by one or both of these problems: Your pancreas does not make enough of a hormone called insulin. Your body does not react in a normal way to the insulin that it makes. Insulin lets glucose enter cells in your body. This gives you energy. If you have diabetes, glucose cannot get into cells. This causes high blood glucose (hyperglycemia). How to treat and manage diabetes You may need to take insulin or other diabetes medicines daily to keep your glucose in balance. If you are prescribed insulin, you will learn how to give yourself insulin by injection. You may need to adjust the amount of insulin you take based on the foods that you eat. You will need to check your blood glucose levels using a glucose monitor as told by your health care provider. The readings can help determine if you have low or high blood glucose. Generally, you should have these blood glucose levels: Before meals (preprandial): 80-130 mg/dL (4.4-7.2 mmol/L). After meals (postprandial): below 180 mg/dL (10 mmol/L). Hemoglobin A1c (HbA1c) level: less than 7%. Your health care provider will set treatment goals for you. Keep all follow-up visits. This is important. Follow these instructions at home: Diabetes medicines Take your diabetes medicines every day as told by your health care provider. List your diabetes medicines here: Name of medicine: ______________________________ Amount (dose): _______________ Time (a.m./p.m.): _______________ Notes: ___________________________________ Name of medicine: ______________________________ Amount (dose): _______________ Time (a.m./p.m.): _______________ Notes: ___________________________________ Name of medicine:  ______________________________ Amount (dose): _______________ Time (a.m./p.m.): _______________ Notes: ___________________________________ Insulin If you use insulin, list the types of insulin you use here: Insulin type: ______________________________ Amount (dose): _______________ Time (a.m./p.m.): _______________Notes: ___________________________________ Insulin type: ______________________________ Amount (dose): _______________ Time (a.m./p.m.): _______________ Notes: ___________________________________ Insulin type: ______________________________ Amount (dose): _______________ Time (a.m./p.m.): _______________ Notes: ___________________________________ Insulin type: ______________________________ Amount (dose): _______________ Time (a.m./p.m.): _______________ Notes: ___________________________________ Insulin type: ______________________________ Amount (dose): _______________ Time (a.m./p.m.): _______________ Notes: ___________________________________ Managing blood glucose  Check your blood glucose levels using a glucose monitor as told by your health care provider. Write down the times that you check your glucose levels here: Time: _______________ Notes: ___________________________________ Time: _______________ Notes: ___________________________________ Time: _______________ Notes: ___________________________________ Time: _______________ Notes: ___________________________________ Time: _______________ Notes: ___________________________________ Time: _______________ Notes: ___________________________________  Low blood glucose Low blood glucose (hypoglycemia) is when glucose is at or below 70 mg/dL (3.9 mmol/L). Symptoms may include: Feeling: Hungry. Sweaty and clammy. Irritable or easily upset. Dizzy. Sleepy. Having: A fast heartbeat. A headache. A change in your vision. Numbness around the mouth, lips, or tongue. Having trouble with: Moving  (coordination). Sleeping. Treating low blood glucose To treat low blood glucose, eat or drink something containing sugar right away. If you can think clearly and swallow safely, follow the 15:15 rule: Take 15 grams of a fast-acting carb (carbohydrate), as told by your health care provider. Some fast-acting carbs are: Glucose tablets: take 3-4 tablets. Hard candy: eat 3-5 pieces. Fruit juice: drink 4 oz (120 mL). Regular (not diet) soda: drink 4-6 oz (120-180 mL). Honey or sugar: eat 1 Tbsp (15 mL). Check your blood glucose levels 15 minutes after you take the carb. If your glucose is still at or below 70 mg/dL (3.9 mmol/L), take 15 grams of a carb again. If  your glucose does not go above 70 mg/dL (3.9 mmol/L) after 3 tries, get help right away. After your glucose goes back to normal, eat a meal or a snack within 1 hour. Treating very low blood glucose If your glucose is at or below 54 mg/dL (3 mmol/L), you have very low blood glucose (severe hypoglycemia). This is an emergency. Do not wait to see if the symptoms will go away. Get medical help right away. Call your local emergency services (911 in the [place] Do not drive yourself to the hospital. Questions to ask your health care provider Should I talk with a diabetes educator? What equipment will I need to care for myself at home? What diabetes medicines do I need? When should I take them? How often do I need to check my blood glucose levels? What number can I call if I have questions? When is my follow-up visit? Where can I find a support group for people with diabetes? Where to find more information American Diabetes Association: www.diabetes.org Association of Diabetes Care and Education Specialists: www.diabeteseducator.org Contact a health care provider if: Your blood glucose is at or above 240 mg/dL (13.3 mmol/L) for 2 days in a row. You have been sick or have had a fever for 2 days or more, and you are not getting better. You  have any of these problems for more than 6 hours: You cannot eat or drink. You feel nauseous. You vomit. You have diarrhea. Get help right away if: Your blood glucose is lower than 54 mg/dL (3 mmol/L). You get confused. You have trouble thinking clearly. You have trouble breathing. These symptoms may represent a serious problem that is an emergency. Do not wait to see if the symptoms will go away. Get medical help right away. Call your local emergency services (911 in the [place] Do not drive yourself to the hospital. Summary Diabetes mellitus is a chronic disease that occurs when the body does not properly use sugar (glucose) that is released from food after you eat. Take insulin and diabetes medicines as told. Check your blood glucose every day, as often as told. Keep all follow-up visits. This is important. This information is not intended to replace advice given to you by your health care provider. Make sure you discuss any questions you have with your health care provider. Document Revised: 02/04/2020 Document Reviewed: 02/04/2020 Elsevier Patient Education  [place].

## 2023-01-13 ENCOUNTER — Ambulatory Visit (INDEPENDENT_AMBULATORY_CARE_PROVIDER_SITE_OTHER): Payer: Medicare HMO | Admitting: Nurse Practitioner

## 2023-01-13 ENCOUNTER — Encounter: Payer: Self-pay | Admitting: Nurse Practitioner

## 2023-01-13 VITALS — BP 115/81 | HR 98 | Temp 97.6°F | Ht 69.02 in | Wt 193.9 lb

## 2023-01-13 DIAGNOSIS — E538 Deficiency of other specified B group vitamins: Secondary | ICD-10-CM | POA: Diagnosis not present

## 2023-01-13 DIAGNOSIS — N200 Calculus of kidney: Secondary | ICD-10-CM

## 2023-01-13 DIAGNOSIS — E785 Hyperlipidemia, unspecified: Secondary | ICD-10-CM

## 2023-01-13 DIAGNOSIS — E1165 Type 2 diabetes mellitus with hyperglycemia: Secondary | ICD-10-CM

## 2023-01-13 DIAGNOSIS — F332 Major depressive disorder, recurrent severe without psychotic features: Secondary | ICD-10-CM | POA: Diagnosis not present

## 2023-01-13 DIAGNOSIS — E43 Unspecified severe protein-calorie malnutrition: Secondary | ICD-10-CM

## 2023-01-13 DIAGNOSIS — F422 Mixed obsessional thoughts and acts: Secondary | ICD-10-CM

## 2023-01-13 DIAGNOSIS — E1169 Type 2 diabetes mellitus with other specified complication: Secondary | ICD-10-CM

## 2023-01-13 DIAGNOSIS — D696 Thrombocytopenia, unspecified: Secondary | ICD-10-CM | POA: Diagnosis not present

## 2023-01-13 DIAGNOSIS — E559 Vitamin D deficiency, unspecified: Secondary | ICD-10-CM | POA: Diagnosis not present

## 2023-01-13 DIAGNOSIS — E6609 Other obesity due to excess calories: Secondary | ICD-10-CM

## 2023-01-13 LAB — URINALYSIS, ROUTINE W REFLEX MICROSCOPIC
Bilirubin, UA: NEGATIVE
Ketones, UA: NEGATIVE
Nitrite, UA: NEGATIVE
Specific Gravity, UA: 1.015 (ref 1.005–1.030)
Urobilinogen, Ur: 0.2 mg/dL (ref 0.2–1.0)
pH, UA: 5 (ref 5.0–7.5)

## 2023-01-13 LAB — MICROSCOPIC EXAMINATION
Bacteria, UA: NONE SEEN
Epithelial Cells (non renal): NONE SEEN /hpf (ref 0–10)

## 2023-01-13 LAB — MICROALBUMIN, URINE WAIVED
Creatinine, Urine Waived: 50 mg/dL (ref 10–300)
Microalb, Ur Waived: 150 mg/L — ABNORMAL HIGH (ref 0–19)
Microalb/Creat Ratio: >300 mg/g — ABNORMAL HIGH (ref <30)

## 2023-01-13 LAB — BAYER DCA HB A1C WAIVED: HB A1C (BAYER DCA - WAIVED): 9 % — ABNORMAL HIGH (ref 4.8–5.6)

## 2023-01-13 MED ORDER — EMPAGLIFLOZIN 25 MG PO TABS: 25.0000 mg | ORAL_TABLET | Freq: Every day | ORAL | 4 refills | Status: DC

## 2023-01-13 NOTE — Assessment & Plan Note (Signed)
Ongoing.  Noted on recent labs, recheck today.

## 2023-01-13 NOTE — Assessment & Plan Note (Addendum)
Diagnosed December 2022 -- A1c 9.9% at the time -- A1c today is 9%, increased from previous 6.3% due to eating a lot of fast food.  We discussed at length need to focus on diet changes with more healthy options vs quick options.  Increase Jardiance to 25 MG and continue Januvia 100 MG.  Could consider change to GLP1 in future, however unsure he will perform weekly injection.  Recommend he check BS at home daily and document for next visit so provider can review.  Focus on diabetic diet.  LABS: CBC, CMP, TSH, urine ALB.  Urine ALB 150 March 2024. - Refuses vaccines - Foot exam up to date and needs eye exam - ACE and statin on board - Could consider retrial of lower dose Metformin XL in future

## 2023-01-13 NOTE — Addendum Note (Signed)
Addended by: Marnee Guarneri T on: 01/13/2023 03:09 PM   Modules accepted: Orders

## 2023-01-13 NOTE — Assessment & Plan Note (Signed)
Chronic, ongoing.  Will continue Rosuvastatin 10 MG daily as is tolerating and monitor, adjust as needed.  Lipid panel and CMP today.

## 2023-01-13 NOTE — Assessment & Plan Note (Signed)
Chronic, ongoing.  Noted past labs and is taking supplement, recheck today. 

## 2023-01-13 NOTE — Assessment & Plan Note (Signed)
Some loss present today -- 8 pounds, although is eating well.  Suspect related to his kidney stones currently and occasional discomfort.  Check CMP today.

## 2023-01-13 NOTE — Assessment & Plan Note (Signed)
Refer to depression plan of care. 

## 2023-01-13 NOTE — Assessment & Plan Note (Addendum)
Ongoing, continue collaboration with urology -- currently no symptoms.  Recommend he continue to follow with urology.

## 2023-01-13 NOTE — Assessment & Plan Note (Signed)
Ongoing.  Noted on labs March 2023, he is taking supplement daily at this time.  Recheck on labs.

## 2023-01-13 NOTE — Progress Notes (Signed)
BP 115/81   Pulse 98   Temp 97.6 F (36.4 C) (Oral)   Ht 5' 9.02" (1.753 m)   Wt 193 lb 14.4 oz (88 kg)   SpO2 98%   BMI 28.62 kg/m    Subjective:    Patient ID: Dillon [name], male    DOB: 1951-07-27, 72 y.o.   MRN: [id number]  HPI: Dillon [name] is a 72 y.o. male  Chief Complaint  Patient presents with   Diabetes   Hypertension   Hyperlipidemia   Mood   DIABETES A1c 6.3% December 2023.  Initial diagnosis in December 2022, A1c was 9.9%.  Metformin in past caused issues with diarrhea.  Currently to be taking Jardiance 10 MG daily and Januvia 100 MG daily.  Does endorse eating fast food often.  He is also taking Vitamin D for low levels on labs -- weekly dosing.   Taking B12 daily for low levels past checks. Hypoglycemic episodes:no Polydipsia/polyuria: none Visual disturbance: no Chest pain: no Paresthesias: no Glucose Monitoring: yes  Accucheck frequency: not checking  Fasting glucose:  Post prandial:  Evening:  Before meals: Taking Insulin?: no  Long acting insulin:  Short acting insulin: Blood Pressure Monitoring: not checking Retinal Examination: Not Up To Date -- macular degeneration Foot Exam: Up to Date Pneumovax: Not up to Date - refuses vaccines Influenza: Not up to Date - refuses vaccines Aspirin: no   HYPERTENSION / HYPERLIPIDEMIA Currently to be taking Lisinopril 5 MG daily + Crestor 10 MG daily. Satisfied with current treatment? yes Duration of hypertension: chronic BP monitoring frequency: not checking BP range:  BP medication side effects: no Past BP meds:  Duration of hyperlipidemia: chronic Aspirin: no Recent stressors: no Recurrent headaches: no Visual changes: no Palpitations: no Dyspnea: no Chest pain: no Lower extremity edema: no Dizzy/lightheaded: no   KIDNEY STONES Saw urology last on 12/27/22 for cystoscopy.  Is taking Flomax and Ditropan.  History of kidney stones, continues to have one on current imaging to  right kidney 1.3 cm. BPH status: stable Satisfied with current treatment?: yes Medication side effects: no Medication compliance: good compliance Duration: months Nocturia: 1/night Urinary frequency:no Incomplete voiding: no Urgency: no Weak urinary stream: no Straining to start stream: no Dysuria: no  OCD Saw psychiatry last on 11/29/22 -- recent ECT treatments.  Currently taking Olanzapine and Fluvoxamine + Trazodone.  He is to return to see them on 01/26/23.  He thinks ECT affected his short term memory. Mood status: stable Satisfied with current treatment?: no Symptom severity: severe  Duration of current treatment : chronic Side effects: no Medication compliance: good compliance Psychotherapy/counseling: yes in the past Depressed mood: occasionally Anxious mood: no Anhedonia:no Significant weight loss or gain: no Insomnia: yes hard to stay asleep Fatigue: no Feelings of worthlessness or guilt: no Impaired concentration/indecisiveness: no Suicidal ideations: no Hopelessness: yes Crying spells: no    01/13/2023    9:20 AM 12/14/2022    3:07 PM 11/29/2022    9:55 AM 10/11/2022   12:16 PM 04/08/2022    1:58 PM  Depression screen PHQ 2/9  Decreased Interest 1 0 1 3 0  Down, Depressed, Hopeless 1 1 0 2 0  PHQ - 2 Score 2 1 1 5  0  Altered sleeping 0 0 0 2 2  Tired, decreased energy 1 0 1 2 2   Change in appetite 1 1 2 3  0  Feeling bad or failure about yourself  0 0 0 2 0  Trouble concentrating 0  0 0 1 0  Moving slowly or fidgety/restless 0 0 0 1 0  Suicidal thoughts 0 0 0 0 0  PHQ-9 Score 4 2 4 16 4   Difficult doing work/chores Not difficult at all Not difficult at all Not difficult at all Very difficult Not difficult at all       01/13/2023    9:21 AM 12/14/2022    3:08 PM 11/29/2022    9:55 AM 10/11/2022   12:16 PM  GAD 7 : Generalized Anxiety Score  Nervous, Anxious, on Edge 0 0 0 3  Control/stop worrying 0 0 0 3  Worry too much - different things 0 0 0 3   Trouble relaxing 0 0 1 1  Restless 0 0 0 1  Easily annoyed or irritable 0 0 0 1  Afraid - awful might happen 0 0 0 3  Total GAD 7 Score 0 0 1 15  Anxiety Difficulty Not difficult at all Not difficult at all Not difficult at all Somewhat difficult    Relevant past medical, surgical, family and social history reviewed and updated as indicated. Interim medical history since our last visit reviewed. Allergies and medications reviewed and updated.  Review of Systems  Constitutional:  Negative for activity change, diaphoresis, fatigue and fever.  Respiratory:  Negative for cough, chest tightness, shortness of breath and wheezing.   Cardiovascular:  Negative for chest pain, palpitations and leg swelling.  Gastrointestinal: Negative.   Neurological: Negative.   Psychiatric/Behavioral:  Negative for decreased concentration, self-injury, sleep disturbance and suicidal ideas. The patient is not nervous/anxious.     Per HPI unless specifically indicated above     Objective:    BP 115/81   Pulse 98   Temp 97.6 F (36.4 C) (Oral)   Ht 5' 9.02" (1.753 m)   Wt 193 lb 14.4 oz (88 kg)   SpO2 98%   BMI 28.62 kg/m   Wt Readings from Last 3 Encounters:  01/13/23 193 lb 14.4 oz (88 kg)  12/27/22 201 lb 12.8 oz (91.5 kg)  12/21/22 201 lb 12.8 oz (91.5 kg)    Physical Exam Vitals and nursing note reviewed.  Constitutional:      General: He is awake. He is not in acute distress.    Appearance: He is well-developed and well-groomed. He is obese. He is not ill-appearing.  HENT:     Head: Normocephalic and atraumatic.     Right Ear: Hearing normal. No drainage.     Left Ear: Hearing normal. No drainage.  Eyes:     General: Lids are normal.        Right eye: No discharge.        Left eye: No discharge.     Conjunctiva/sclera: Conjunctivae normal.     Pupils: Pupils are equal, round, and reactive to light.  Neck:     Thyroid: No thyromegaly.     Vascular: No carotid bruit.     Trachea:  Trachea normal.  Cardiovascular:     Rate and Rhythm: Normal rate and regular rhythm.     Heart sounds: Normal heart sounds, S1 normal and S2 normal. No murmur heard.    No gallop.  Pulmonary:     Effort: Pulmonary effort is normal. No accessory muscle usage or respiratory distress.     Breath sounds: Normal breath sounds.  Abdominal:     General: Bowel sounds are normal.     Palpations: Abdomen is soft.  Musculoskeletal:  General: Normal range of motion.     Cervical back: Normal range of motion and neck supple.     Right lower leg: No edema.     Left lower leg: No edema.  Skin:    General: Skin is warm and dry.     Capillary Refill: Capillary refill takes less than 2 seconds.  Neurological:     Mental Status: He is alert and oriented to person, place, and time.     Deep Tendon Reflexes: Reflexes are normal and symmetric.  Psychiatric:        Attention and Perception: Attention normal.        Mood and Affect: Mood normal.        Speech: Speech normal.        Behavior: Behavior normal. Behavior is cooperative.        Thought Content: Thought content normal.    Diabetic Foot Exam - Simple   Simple Foot Form Visual Inspection See comments: Yes Sensation Testing Intact to touch and monofilament testing bilaterally: Yes Pulse Check Posterior Tibialis and Dorsalis pulse intact bilaterally: Yes Comments Thickened nails and dry skin.     Results for orders placed or performed during the hospital encounter of 12/27/22  Glucose, capillary  Result Value Ref Range   Glucose-Capillary 253 (H) 70 - 99 mg/dL   Comment 1 Notify RN    Comment 2 Document in Chart   Calculi, with Photograph (to Clinical Lab)  Result Value Ref Range   Source Calculi Comment    Color Calculi Brown    Size Calculi 2x2 mm   Weight Calculi 8 mg   Composition Calculi Comment    Calcium Oxalate Monohydrate 40 %   Uric Acid Calculi 60 %   Comment Calculi 2 Comment    Photo Calculi Comment     Comment Calculi 3 Comment    Please Note: Comment    DISCLAIMER: Comment   Glucose, capillary  Result Value Ref Range   Glucose-Capillary 238 (H) 70 - 99 mg/dL      Assessment & Plan:   Problem List Items Addressed This Visit       Endocrine   Hyperlipidemia associated with type 2 diabetes mellitus (HCC)    Chronic, ongoing.  Will continue Rosuvastatin 10 MG daily as is tolerating and monitor, adjust as needed.  Lipid panel and CMP today.      Relevant Medications   empagliflozin (JARDIANCE) 25 MG TABS tablet   Other Relevant Orders   Bayer DCA Hb A1c Waived   Comprehensive metabolic panel   Lipid Panel w/o Chol/HDL Ratio   Type 2 diabetes mellitus with hyperglycemia, without long-term current use of insulin ([place]) - Primary    Diagnosed December 2022 -- A1c 9.9% at the time -- A1c today is 9%, increased from previous 6.3% due to eating a lot of fast food.  We discussed at length need to focus on diet changes with more healthy options vs quick options.  Increase Jardiance to 25 MG and continue Januvia 100 MG.  Could consider change to GLP1 in future, however unsure he will perform weekly injection.  Recommend he check BS at home daily and document for next visit so provider can review.  Focus on diabetic diet.  LABS: CBC, CMP, TSH, urine ALB.  Urine ALB 150 March 2024. - Refuses vaccines - Foot exam up to date and needs eye exam - ACE and statin on board - Could consider retrial of lower dose Metformin XL in  future      Relevant Medications   empagliflozin (JARDIANCE) 25 MG TABS tablet   Other Relevant Orders   Bayer DCA Hb A1c Waived   Microalbumin, Urine Waived   Urinalysis, Routine w reflex microscopic     Genitourinary   Right nephrolithiasis    Ongoing, continue collaboration with urology -- currently no symptoms.  Recommend he continue to follow with urology.        Hematopoietic and Hemostatic   Thrombocytopenia ([place])    Ongoing.  Noted on recent labs, recheck  today.      Relevant Orders   CBC with Differential/Platelet     Other   OCD (obsessive compulsive disorder)    Refer to depression plan of care.      Protein-calorie malnutrition, severe (HCC)    Some loss present today -- 8 pounds, although is eating well.  Suspect related to his kidney stones currently and occasional discomfort.  Check CMP today.      Relevant Orders   Comprehensive metabolic panel   Severe recurrent major depression without psychotic features (HCC)    Chronic, ongoing.  Denies SI/HI.  Overall mood much stable today and scores improved.  Will continue current medication regimen and collaboration with psychiatry.  Obtain EKG next visit to as do not see where he obtained recently and psychiatry had recommended/ordered.  Refills sent in.      Relevant Orders   TSH   Vitamin B12 deficiency    Ongoing.  Noted on labs March 2023, he is taking supplement daily at this time.  Recheck on labs.      Relevant Orders   Vitamin B12   Vitamin D deficiency    Chronic, ongoing.  Noted past labs and is taking supplement, recheck today.      Relevant Orders   VITAMIN D 25 Hydroxy (Vit-D Deficiency, Fractures)     Follow up plan: Return in about 4 weeks (around 02/10/2023) for T2DM.

## 2023-01-13 NOTE — Assessment & Plan Note (Signed)
Chronic, ongoing.  Denies SI/HI.  Overall mood much stable today and scores improved.  Will continue current medication regimen and collaboration with psychiatry.  Obtain EKG next visit to as do not see where he obtained recently and psychiatry had recommended/ordered.  Refills sent in.

## 2023-01-14 LAB — VITAMIN D 25 HYDROXY (VIT D DEFICIENCY, FRACTURES): Vit D, 25-Hydroxy: 23.7 ng/mL — ABNORMAL LOW (ref 30.0–100.0)

## 2023-01-14 LAB — CBC WITH DIFFERENTIAL/PLATELET
Basophils Absolute: 0 10*3/uL (ref 0.0–0.2)
Basos: 0 %
EOS (ABSOLUTE): 0.1 10*3/uL (ref 0.0–0.4)
Eos: 0 %
Hematocrit: 50.5 % (ref 37.5–51.0)
Hemoglobin: 17.6 g/dL (ref 13.0–17.7)
Immature Grans (Abs): 0 10*3/uL (ref 0.0–0.1)
Immature Granulocytes: 0 %
Lymphocytes Absolute: 3.2 10*3/uL — ABNORMAL HIGH (ref 0.7–3.1)
Lymphs: 25 %
MCH: 30.8 pg (ref 26.6–33.0)
MCHC: 34.9 g/dL (ref 31.5–35.7)
MCV: 88 fL (ref 79–97)
Monocytes Absolute: 1.2 10*3/uL — ABNORMAL HIGH (ref 0.1–0.9)
Monocytes: 9 %
Neutrophils Absolute: 8.3 10*3/uL — ABNORMAL HIGH (ref 1.4–7.0)
Neutrophils: 66 %
Platelets: 209 10*3/uL (ref 150–450)
RBC: 5.71 x10E6/uL (ref 4.14–5.80)
RDW: 12.3 % (ref 11.6–15.4)
WBC: 12.8 10*3/uL — ABNORMAL HIGH (ref 3.4–10.8)

## 2023-01-14 LAB — COMPREHENSIVE METABOLIC PANEL
ALT: 16 IU/L (ref 0–44)
AST: 10 IU/L (ref 0–40)
Albumin/Globulin Ratio: 2 (ref 1.2–2.2)
Albumin: 4.3 g/dL (ref 3.9–4.9)
Alkaline Phosphatase: 155 IU/L — ABNORMAL HIGH (ref 44–121)
BUN/Creatinine Ratio: 21 (ref 10–24)
BUN: 24 mg/dL (ref 8–27)
Bilirubin Total: 0.7 mg/dL (ref 0.0–1.2)
CO2: 19 mmol/L — ABNORMAL LOW (ref 20–29)
Calcium: 10.8 mg/dL — ABNORMAL HIGH (ref 8.6–10.2)
Chloride: 104 mmol/L (ref 96–106)
Creatinine, Ser: 1.12 mg/dL (ref 0.76–1.27)
Globulin, Total: 2.2 g/dL (ref 1.5–4.5)
Glucose: 207 mg/dL — ABNORMAL HIGH (ref 70–99)
Potassium: 4.6 mmol/L (ref 3.5–5.2)
Sodium: 142 mmol/L (ref 134–144)
Total Protein: 6.5 g/dL (ref 6.0–8.5)
eGFR: 71 mL/min/{1.73_m2} (ref >59)

## 2023-01-14 LAB — TSH: TSH: 1.01 u[IU]/mL (ref 0.450–4.500)

## 2023-01-14 LAB — LIPID PANEL W/O CHOL/HDL RATIO
Cholesterol, Total: 222 mg/dL — ABNORMAL HIGH (ref 100–199)
HDL: 44 mg/dL (ref >39)
LDL Chol Calc (NIH): 152 mg/dL — ABNORMAL HIGH (ref 0–99)
Triglycerides: 146 mg/dL (ref 0–149)
VLDL Cholesterol Cal: 26 mg/dL (ref 5–40)

## 2023-01-14 LAB — VITAMIN B12: Vitamin B-12: 742 pg/mL (ref 232–1245)

## 2023-01-14 NOTE — Progress Notes (Signed)
Good morning, please let Dillon [name] know his labs have returned: - CBC is showing mild elevation in white blood cell count and neutrophils -- I am concerned you may still have a mild urine infection and am waiting on urine culture to return.  If present we will treat.  Continue to visit with urology due to your kidney stones and urine infection risk with these. - Kidney and liver function are stable. - Cholesterol levels are elevated, are you taking your Rosuvastatin daily? Please ensure you do and we will recheck next visit + adjust medication as needed. - Vitamin D level remains low but trending up -- continue your Vitamin D supplement weekly please. - Remainder of labs are normal.  Any questions?  We will recheck CBC next visit.   Keep being amazing!!  Thank you for allowing me to participate in your care.  I appreciate you. Kindest regards, Kelbie Moro

## 2023-01-19 ENCOUNTER — Other Ambulatory Visit: Payer: Self-pay | Admitting: Nurse Practitioner

## 2023-01-19 LAB — URINE CULTURE

## 2023-01-19 MED ORDER — CIPROFLOXACIN HCL 500 MG PO TABS: 500.0000 mg | ORAL_TABLET | Freq: Every day | ORAL | 0 refills | Status: AC

## 2023-01-19 NOTE — Progress Notes (Signed)
Good afternoon, please let Dillon [name] know his urine returned showing infection present.  I am sending in Ciprofloxacin for him to take daily for 5 days and would like to recheck urine on his 02/10/23 visit.  Ensure to drink plenty of water.  If any worsening symptoms let me know. Keep being amazing!!  Thank you for allowing me to participate in your care.  I appreciate you. Kindest regards, Vernestine Brodhead

## 2023-01-24 NOTE — Progress Notes (Deleted)
BH MD/PA/NP OP Progress Note  01/24/2023 7:34 AM Dillon [name]  MRN:  [id number]  Chief Complaint: No chief complaint on file.  HPI:  - since the last visit, he underwent the following for nephrolithiasis Cystoscopy Right ureteroscopy and stone removal Ureteroscopic laser lithotripsy Right ureteral stent exchange (72F/26 cm)  Right retrograde pyelography with interpretation Intraoperative fluoroscopy < 30 minutes Visit Diagnosis: No diagnosis found.  Past Psychiatric History: Please see initial evaluation for full details. I have reviewed the history. No updates at this time.     Past Medical History:  Past Medical History:  Diagnosis Date   Anomalies of urachus, congenital    BPH with urinary obstruction    Depression    Diabetes mellitus without complication (HCC)    History of kidney stones    Hydronephrosis    Hyperlipidemia    Hypernatremia    Hypokalemia    Kidney stone    Macular degeneration    Meningitis    spinal   OCD (obsessive compulsive disorder)    Protein-calorie malnutrition, severe (HCC)    Shingles    Thrombocytopenia (HCC)     Past Surgical History:  Procedure Laterality Date   CYSTOSCOPY W/ URETERAL STENT PLACEMENT Right 10/12/2022   Procedure: CYSTOSCOPY WITH RETROGRADE PYELOGRAM/URETERAL STENT PLACEMENT;  Surgeon: Riki Altes, MD;  Location: [facility] OR[facility];  Service: Urology;  Laterality: Right;   CYSTOSCOPY/URETEROSCOPY/HOLMIUM LASER/STENT PLACEMENT Right 12/13/2022   Procedure: CYSTOSCOPY/URETEROSCOPY/HOLMIUM LASER/STENT EXCHANGE;  Surgeon: Riki Altes, MD;  Location: [facility];  Service: Urology;  Laterality: Right;   CYSTOSCOPY/URETEROSCOPY/HOLMIUM LASER/STENT PLACEMENT Right 12/27/2022   Procedure: CYSTOSCOPY/URETEROSCOPY/HOLMIUM LASER/STENT EXCHANGE;  Surgeon: Riki Altes, MD;  Location: [facility] OR[facility];  Service: Urology;  Laterality: Right;   KIDNEY STONE SURGERY      Family Psychiatric History: Please see initial evaluation for full  details. I have reviewed the history. No updates at this time.     Family History:  Family History  Problem Relation Age of Onset   Cancer Mother        bladder   Emphysema Father    Emphysema Sister    COPD Sister    Cancer Brother        prostate    Social History:  Social History   Socioeconomic History   Marital status: Widowed    Spouse name: Not on file   Number of children: Not on file   Years of education: Not on file   Highest education level: GED or equivalent  Occupational History   Occupation: retired  Tobacco Use   Smoking status: Never   Smokeless tobacco: Never  Vaping Use   Vaping Use: Never used  Substance and Sexual Activity   Alcohol use: No    Alcohol/week: 0.0 standard drinks of alcohol   Drug use: No   Sexual activity: Not Currently  Other Topics Concern   Not on file  Social History Narrative   Live alone and  have two children that are very supportive.   Social Determinants of Health   Financial Resource Strain: Low Risk  (02/10/2022)   Overall Financial Resource Strain (CARDIA)    Difficulty of Paying Living Expenses: Not hard at all  Food Insecurity: No Food Insecurity (11/03/2022)   Hunger Vital Sign    Worried About Running Out of Food in the Last Year: Never true    Ran Out of Food in the Last Year: Never true  Transportation Needs: No Transportation Needs (11/03/2022)   PRAPARE - Transportation  Lack of Transportation (Medical): No    Lack of Transportation (Non-Medical): No  Physical Activity: Inactive (02/10/2022)   Exercise Vital Sign    Days of Exercise per Week: 0 days    Minutes of Exercise per Session: 0 min  Stress: No Stress Concern Present (02/10/2022)   [name] of Occupational Health - Occupational Stress Questionnaire    Feeling of Stress : Not at all  Social Connections: Moderately Integrated (02/10/2022)   Social Connection and Isolation Panel [NHANES]    Frequency of Communication with Friends and  Family: Twice a week    Frequency of Social Gatherings with Friends and Family: Twice a week    Attends Religious Services: 1 to 4 times per year    Active Member of [employer] or Organizations: No    Attends Engineer, structural: More than 4 times per year    Marital Status: Widowed    Allergies:  Allergies  Allergen Reactions   Codeine Other (See Comments)    Unsure of reaction    Metabolic Disorder Labs: Lab Results  Component Value Date   HGBA1C 9.0 (H) 01/13/2023   MPG 134 10/11/2022   MPG 91 03/27/2017   Lab Results  Component Value Date   PROLACTIN 29.7 (H) 01/05/2016   Lab Results  Component Value Date   CHOL 222 (H) 01/13/2023   TRIG 146 01/13/2023   HDL 44 01/13/2023   CHOLHDL 4.6 03/27/2017   VLDL 23 03/27/2017   LDLCALC 152 (H) 01/13/2023   LDLCALC 74 10/11/2022   Lab Results  Component Value Date   TSH 1.010 01/13/2023   TSH 1.223 10/11/2022    Therapeutic Level Labs: No results found for: "LITHIUM" No results found for: "VALPROATE" No results found for: "CBMZ"  Current Medications: Current Outpatient Medications  Medication Sig Dispense Refill   ciprofloxacin (CIPRO) 500 MG tablet Take 1 tablet (500 mg total) by mouth daily with breakfast for 5 days. 5 tablet 0   cyanocobalamin (VITAMIN B12) 1000 MCG tablet Take 1 tablet (1,000 mcg total) by mouth daily. 90 tablet 4   empagliflozin (JARDIANCE) 25 MG TABS tablet Take 1 tablet (25 mg total) by mouth daily before breakfast. 90 tablet 4   fluvoxaMINE (LUVOX) 100 MG tablet Take 1 tablet (100 mg total) by mouth 2 (two) times daily at 8 am and 4 pm. 180 tablet 4   glucose blood (ACCU-CHEK GUIDE) test strip Use to check blood sugars 2-3 times daily with goals = <130 fasting in morning and <180 two hours after eating. Bring blood sugar log to appointments. 100 each 12   HYDROcodone-acetaminophen (NORCO/VICODIN) 5-325 MG tablet Take 1 tablet by mouth every 6 (six) hours as needed for moderate pain. 8  tablet 0   lisinopril (ZESTRIL) 5 MG tablet Take 5 mg by mouth daily.     Multiple Vitamins-Minerals (ICAPS AREDS 2 PO) Take 1 tablet by mouth 2 (two) times daily.     OLANZapine (ZYPREXA) 10 MG tablet Take 1 tablet (10 mg total) by mouth at bedtime. 90 tablet 4   oxybutynin (DITROPAN) 5 MG tablet 1 tab tid prn frequency,urgency, bladder spasm 30 tablet 1   rosuvastatin (CRESTOR) 10 MG tablet Take 10 mg by mouth at bedtime.     sitaGLIPtin (JANUVIA) 100 MG tablet Take 1 tablet (100 mg total) by mouth daily. 90 tablet 4   tamsulosin (FLOMAX) 0.4 MG CAPS capsule Take 1 capsule (0.4 mg total) by mouth daily after breakfast. 30 capsule 0  traZODone (DESYREL) 50 MG tablet Take 1 tablet (50 mg total) by mouth at bedtime. 90 tablet 4   Vitamin D, Ergocalciferol, (DRISDOL) 1.25 MG (50000 UNIT) CAPS capsule Take 1 capsule (50,000 Units total) by mouth every 7 (seven) days. (Patient taking differently: Take 50,000 Units by mouth every 7 (seven) days. Tuesdays) 12 capsule 0   No current facility-administered medications for this visit.     Musculoskeletal: Strength & Muscle Tone: within normal limits Gait & Station: normal Patient leans: N/A  Psychiatric Specialty Exam: Review of Systems  There were no vitals taken for this visit.There is no height or weight on file to calculate BMI.  General Appearance: {Appearance:22683}  Eye Contact:  {BHH EYE CONTACT:22684}  Speech:  Clear and Coherent  Volume:  Normal  Mood:  {BHH MOOD:22306}  Affect:  {Affect (PAA):22687}  Thought Process:  Coherent  Orientation:  Full (Time, Place, and Person)  Thought Content: Logical   Suicidal Thoughts:  {ST/HT (PAA):22692}  Homicidal Thoughts:  {ST/HT (PAA):22692}  Memory:  Immediate;   Good  Judgement:  {Judgement (PAA):22694}  Insight:  {Insight (PAA):22695}  Psychomotor Activity:  Normal  Concentration:  Concentration: Good and Attention Span: Good  Recall:  Good  Fund of Knowledge: Good  Language: Good   Akathisia:  No  Handed:  Right  AIMS (if indicated): not done  Assets:  Communication Skills Desire for Improvement  ADL's:  Intact  Cognition: WNL  Sleep:  {BHH GOOD/FAIR/POOR:22877}   Screenings: AIMS    Flowsheet Row Admission (Discharged) from 03/22/2017 in [facility] BE[facility]ICINE Admission (Discharged) from 01/04/2016 in [facility] INPATIENT BEHAVIORAL MEDICINE Admission (Discharged) from 12/15/2015 in [facility] INPATIENT BEHAVIORAL MEDICINE  AIMS Total Score 3 0 0      AUDIT    Flowsheet Row Admission (Discharged) from 10/19/2022 in [facility] BEHAV[facility]AL MEDICINE Admission (Discharged) from 03/22/2017 in [facility] INPATIENT BEHAVIORAL MEDICINE Admission (Discharged) from 01/04/2016 in [facility] INPATIENT BEHAVIORAL MEDICINE Admission (Discharged) from 12/15/2015 in [facility] INPATIENT BEHAVIORAL MEDICINE  Alcohol Use Disorder Identification Test Final Score (AUDIT) 0 0 0 0      ECT-MADRS    Flowsheet Row ECT Treatment from 04/17/2017 in [facility] ECT Treatment from 03/31/2017 in [facility] Admission (Discharged) from 03/22/2017 in [facility] INPATIENT BEHAVIORAL MEDICINE Admission (Discharged) from 01/04/2016 in [facility] INPATIENT BEHAVIORAL MEDICINE  MADRS Total Score 28 30 30 20       GAD-7    Flowsheet Row Office Visit from 01/13/2023 in [facility] Office Visit from 12/14/2022 in [facility] Office Visit from 11/29/2022 in [facility] Office Visit from 10/11/2022 in [facility] Office Visit from 04/08/2022 in [facility]  Total GAD-7 Score 0 0 1 15 3       Mini-Mental    Flowsheet Row ECT Treatment from 04/17/2017 in [facility] ECT Treatment from 03/31/2017 in [facility] Admission (Discharged) from 03/22/2017 in [facility] INPATIENT BEHAVIORAL  MEDICINE Admission (Discharged) from 01/04/2016 in [facility] INPATIENT BEHAVIORAL MEDICINE  Total Score (max 30 points ) 30 28 29 30       PHQ2-9    Flowsheet Row Office Visit from 01/13/2023 in [facility] Office Visit from 12/14/2022 in [facility] Office Visit from 11/29/2022 in [facility] Office Visit from 10/11/2022 in [facility]  [facility]  PHQ-2 Total Score 2 1 1 5  0  PHQ-9 Total Score 4 2 4 16 4       Flowsheet Row Admission (Discharged) from [facility]27[facility]2022 [facility] Pre-Admission Testing 45 from [facility]20[facility] Admission (Discharged) [facility]20[facility]  C-SSRS RISK CATEGORY No Risk Error: Question 1 not populated No Risk        Assessment and Plan:  Dillon BlacksmithRobert Henry [name] is a 72 y.o. year old male with a history of depression, OCD, type II diabetes, who is referred for depression. This is after care [facility] last in Jan 2024.    1. MDD (major depressive disorder), recurrent, in partial remission (HCC) 2. Obsessive-compulsive disorder, unspecified type Acute stressors include:  Other stressors include: loss of his wife with COPD in 2016 (found her to be dead at home)    History: reportedly dx with depression, OCD one year after loss of his wife. Admission in 2017, 2023, 2024. [facility]   There has been overall improvement in depressive symptoms, OCD and anxiety since recent admission, which he underwent ECT and treatment with current medication regimen.  He reports great support from his children, grandchildren, and reports his faith to be very helpful.  Will continue current medication regimen.  Will continue fluvoxamine, olanzapine to target  depression, OCD.  Discussed potential metabolic side effect, EPS.  Noted that the patient has a prolonged QTc interval based on the EKG conducted in December. Given the observed benefits of the current dosage, it will be maintained for now. Discussed the potential risk of arrhythmia with the patient and agreed to monitor closely.  Will plan to reassess the olanzapine dosage after a follow-up EKG. (Obtaining EKG was discussed after the visit with Caryl AspKellie, who agrees with the plan. The patient was not available on the phone)   3. Insomnia, unspecified type - history of snoring. He reports overall improvement in insomnia since discharge.  Although he is recommended for sleep evaluation due to history of snoring, she declined this.  Will continue trazodone as needed for insomnia.    Plan Continue Fluvoxamine 100 mg twice a day Continue olanzapine 10 mg at night - HR 77, QTc 432 msec, 11/2022. History of QTc prolongation Obtain EKG Continue trazodone 50 mg at night as needed for insomnia Next appointment: 4/11 at 8 am for 30 mins, IP Obtain 2-way ROI to speak with Caryl AspKellie   The patient demonstrates the following risk factors for suicide: Chronic risk factors for suicide include: psychiatric disorder of depression, OCD . Acute risk factors for suicide include: N/A. Protective factors for this patient include: positive social support, coping skills, and hope for the future. Considering these factors, the overall suicide risk at this point appears to be low. Patient is appropriate for outpatient follow up.   Collaboration of Care: Collaboration of Care: {BH OP Collaboration of Care:21014065}  Patient/Guardian was advised Release of Information must be obtained prior to any record release in order to collaborate their care with an outside provider. Patient/Guardian was advised if they have not already done so to contact the registration department to sign all necessary forms in order for us to release  information regarding their care.   Consent: Patient/Guardian gives verbal consent for treatment and assignment of benefits for services provided during this visit. Patient/Guardian expressed understanding and agreed to proceed.  Neysa Hotter, MD 01/24/2023, 7:34 AM

## 2023-01-26 ENCOUNTER — Telehealth: Payer: Self-pay | Admitting: Nurse Practitioner

## 2023-01-26 ENCOUNTER — Encounter: Payer: Self-pay | Admitting: Urology

## 2023-01-26 ENCOUNTER — Ambulatory Visit (INDEPENDENT_AMBULATORY_CARE_PROVIDER_SITE_OTHER): Payer: Medicare HMO | Admitting: Urology

## 2023-01-26 ENCOUNTER — Ambulatory Visit: Payer: Medicare HMO | Admitting: Psychiatry

## 2023-01-26 VITALS — BP 112/80 | HR 120 | Ht 69.0 in | Wt 190.0 lb

## 2023-01-26 DIAGNOSIS — N261 Atrophy of kidney (terminal): Secondary | ICD-10-CM | POA: Diagnosis not present

## 2023-01-26 DIAGNOSIS — Z466 Encounter for fitting and adjustment of urinary device: Secondary | ICD-10-CM

## 2023-01-26 DIAGNOSIS — N200 Calculus of kidney: Secondary | ICD-10-CM | POA: Diagnosis not present

## 2023-01-26 LAB — URINALYSIS, COMPLETE
Bilirubin, UA: NEGATIVE
Nitrite, UA: NEGATIVE
Specific Gravity, UA: 1.02 (ref 1.005–1.030)
Urobilinogen, Ur: 0.2 mg/dL (ref 0.2–1.0)
pH, UA: 5.5 (ref 5.0–7.5)

## 2023-01-26 LAB — MICROSCOPIC EXAMINATION
RBC, Urine: >30 /hpf — AB (ref 0–2)
WBC, UA: >30 /hpf — AB (ref 0–5)

## 2023-01-26 MED ORDER — LEVOFLOXACIN 500 MG PO TABS
500.0000 mg | ORAL_TABLET | Freq: Once | ORAL | Status: AC
Start: 2023-01-26 — End: 2023-01-26
  Administered 2023-01-26: 500 mg via ORAL

## 2023-01-26 NOTE — Progress Notes (Signed)
   Indications: Patient is 72 y.o., who is s/p staged ureteroscopic removal of a right partial staghorn calculus with obstruction at the UPJ.  KUB performed last week with minute right lower pole fragments the patient is presenting today for stent removal.  Procedure:  Flexible Cystoscopy with stent removal (50277)  Timeout was performed and the correct patient, procedure and participants were identified.    Description:  The patient was prepped and draped in the usual sterile fashion. Flexible cystosopy was performed.  The stent was visualized, grasped, and removed intact without difficulty. The patient tolerated the procedure well.  A single dose of oral antibiotics was given.  Complications:  None  Plan:  Call for fever/flank pain post stent removal We discussed he is at risk for ureteral stricture based on chronicity and recommend a follow-up visit with renal ultrasound in 2 months He had severe hydronephrosis with renal atrophy and unlikely his hydronephrosis will resolve

## 2023-01-26 NOTE — Addendum Note (Signed)
Addended by: Levada Schilling on: 01/26/2023 04:27 PM   Modules accepted: Orders

## 2023-01-26 NOTE — Telephone Encounter (Signed)
Copied from CRM [id number]. Topic: Medicare AWV >> Jan 26, 2023 11:10 AM Payton Doughty wrote: Reason for CRM: Called patient to schedule Medicare Annual Wellness Visit (AWV). No voicemail available to leave a message.  Last date of AWV: 02/10/22  Please schedule an appointment at any time with Kennedy Bucker, LPN  .  If any questions, please contact me.  Thank you ,  Verlee Rossetti; Care Guide Ambulatory Clinical Support [facility] Direct Dial: [phone]

## 2023-01-30 LAB — CULTURE, URINE COMPREHENSIVE

## 2023-02-10 ENCOUNTER — Ambulatory Visit: Payer: Medicare HMO | Admitting: Nurse Practitioner

## 2023-02-24 ENCOUNTER — Telehealth: Payer: Self-pay | Admitting: Nurse Practitioner

## 2023-02-24 NOTE — Telephone Encounter (Signed)
Contacted [name] to schedule their annual wellness visit. Appointment made for 02/28/2023.  Verlee Rossetti; Care Guide Ambulatory Clinical Support [place] [facility] Direct Dial: [phone]
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
## 2023-02-28 ENCOUNTER — Encounter: Payer: Self-pay | Admitting: Psychiatry

## 2023-02-28 ENCOUNTER — Ambulatory Visit (INDEPENDENT_AMBULATORY_CARE_PROVIDER_SITE_OTHER): Payer: Medicare HMO | Admitting: Psychiatry

## 2023-02-28 ENCOUNTER — Ambulatory Visit (INDEPENDENT_AMBULATORY_CARE_PROVIDER_SITE_OTHER): Payer: Medicare HMO

## 2023-02-28 VITALS — BP 153/89 | HR 73 | Temp 97.1°F | Ht 69.0 in | Wt 192.6 lb

## 2023-02-28 VITALS — Ht 69.0 in | Wt 193.0 lb

## 2023-02-28 DIAGNOSIS — Z0000 Encounter for general adult medical examination without abnormal findings: Secondary | ICD-10-CM

## 2023-02-28 DIAGNOSIS — F3341 Major depressive disorder, recurrent, in partial remission: Secondary | ICD-10-CM | POA: Diagnosis not present

## 2023-02-28 DIAGNOSIS — F429 Obsessive-compulsive disorder, unspecified: Secondary | ICD-10-CM | POA: Diagnosis not present

## 2023-02-28 DIAGNOSIS — G4700 Insomnia, unspecified: Secondary | ICD-10-CM

## 2023-02-28 NOTE — Progress Notes (Signed)
I connected with  [name] on 02/28/23 by a audio enabled telemedicine application and verified that I am speaking with the correct person using two identifiers.  Patient Location: Home  Provider Location: Office/Clinic  I discussed the limitations of evaluation and management by telemedicine. The patient expressed understanding and agreed to proceed.  Subjective:   Dillon [name] is a 72 y.o. male who presents for Medicare Annual/Subsequent preventive examination.  Review of Systems     Cardiac Risk Factors include: advanced age (>97men, >27 women);diabetes mellitus;male gender     Objective:    There were no vitals filed for this visit. There is no height or weight on file to calculate BMI.     02/28/2023   11:29 AM 12/27/2022   11:18 AM 12/21/2022    1:08 PM 12/13/2022    8:46 AM 12/07/2022    9:55 AM 10/19/2022    3:56 PM 10/12/2022   10:32 AM  Advanced Directives  Does Patient Have a Medical Advance Directive? No No No No No  No  Would patient like information on creating a medical advance directive? No - Patient declined No - Patient declined No - Patient declined No - Patient declined   No - Patient declined     Information is confidential and restricted. Go to Review Flowsheets to unlock data.    Current Medications (verified) Outpatient Encounter Medications as of 02/28/2023  Medication Sig   cyanocobalamin (VITAMIN B12) 1000 MCG tablet Take 1 tablet (1,000 mcg total) by mouth daily.   empagliflozin (JARDIANCE) 25 MG TABS tablet Take 1 tablet (25 mg total) by mouth daily before breakfast.   fluvoxaMINE (LUVOX) 100 MG tablet Take 1 tablet (100 mg total) by mouth 2 (two) times daily at 8 am and 4 pm.   glucose blood (ACCU-CHEK GUIDE) test strip Use to check blood sugars 2-3 times daily with goals = <130 fasting in morning and <180 two hours after eating. Bring blood sugar log to appointments.   lisinopril (ZESTRIL) 5 MG tablet Take 5 mg by mouth daily.    OLANZapine (ZYPREXA) 10 MG tablet Take 1 tablet (10 mg total) by mouth at bedtime.   oxybutynin (DITROPAN) 5 MG tablet 1 tab tid prn frequency,urgency, bladder spasm   rosuvastatin (CRESTOR) 10 MG tablet Take 10 mg by mouth at bedtime.   sitaGLIPtin (JANUVIA) 100 MG tablet Take 1 tablet (100 mg total) by mouth daily.   tamsulosin (FLOMAX) 0.4 MG CAPS capsule Take 1 capsule (0.4 mg total) by mouth daily after breakfast.   traZODone (DESYREL) 50 MG tablet Take 1 tablet (50 mg total) by mouth at bedtime.   Vitamin D, Ergocalciferol, (DRISDOL) 1.25 MG (50000 UNIT) CAPS capsule Take 1 capsule (50,000 Units total) by mouth every 7 (seven) days. (Patient taking differently: Take 50,000 Units by mouth every 7 (seven) days. Tuesdays)   HYDROcodone-acetaminophen (NORCO/VICODIN) 5-325 MG tablet Take 1 tablet by mouth every 6 (six) hours as needed for moderate pain. (Patient not taking: Reported on 02/28/2023)   Multiple Vitamins-Minerals (ICAPS AREDS 2 PO) Take 1 tablet by mouth 2 (two) times daily. (Patient not taking: Reported on 02/28/2023)   No facility-administered encounter medications on file as of 02/28/2023.    Allergies (verified) Codeine   History: Past Medical History:  Diagnosis Date   Anomalies of urachus, congenital    BPH with urinary obstruction    Depression    Diabetes mellitus without complication (HCC)    History of kidney stones  Hydronephrosis    Hyperlipidemia    Hypernatremia    Hypokalemia    Kidney stone    Macular degeneration    Meningitis    spinal   OCD (obsessive compulsive disorder)    Protein-calorie malnutrition, severe (HCC)    Shingles    Thrombocytopenia (HCC)    Past Surgical History:  Procedure Laterality Date   CYSTOSCOPY W/ URETERAL STENT PLACEMENT Right 10/12/2022   Procedure: CYSTOSCOPY WITH RETROGRADE PYELOGRAM/URETERAL STENT PLACEMENT;  Surgeon: Riki Altes, MD;  Location: [facility];  Service: Urology;  Laterality: Right;    CYSTOSCOPY/URETEROSCOPY/HOLMIUM LASER/STENT PLACEMENT Right 12/13/2022   Procedure: CYSTOSCOPY/URETEROSCOPY/HOLMIUM LASER/STENT EXCHANGE;  Surgeon: Riki Altes, MD;  Location: [facility];  Service: Urology;  Laterality: Right;   CYSTOSCOPY/URETEROSCOPY/HOLMIUM LASER/STENT PLACEMENT Right 12/27/2022   Procedure: CYSTOSCOPY/URETEROSCOPY/HOLMIUM LASER/STENT EXCHANGE;  Surgeon: Riki Altes, MD;  Location: [facility];  Service: Urology;  Laterality: Right;   KIDNEY STONE SURGERY     Family History  Problem Relation Age of Onset   Cancer Mother        bladder   Emphysema Father    Emphysema Sister    COPD Sister    Cancer Brother        prostate   Social History   Socioeconomic History   Marital status: Widowed    Spouse name: Not on file   Number of children: Not on file   Years of education: Not on file   Highest education level: GED or equivalent  Occupational History   Occupation: retired  Tobacco Use   Smoking status: Never   Smokeless tobacco: Never  Vaping Use   Vaping Use: Never used  Substance and Sexual Activity   Alcohol use: No    Alcohol/week: 0.0 standard drinks of alcohol   Drug use: No   Sexual activity: Not Currently  Other Topics Concern   Not on file  Social History Narrative   Live alone and  have two children that are very supportive.   Social Determinants of Health   Financial Resource Strain: Low Risk  (02/28/2023)   Overall Financial Resource Strain (CARDIA)    Difficulty of Paying Living Expenses: Not hard at all  Food Insecurity: No Food Insecurity (02/28/2023)   Hunger Vital Sign    Worried About Running Out of Food in the Last Year: Never true    Ran Out of Food in the Last Year: Never true  Transportation Needs: No Transportation Needs (02/28/2023)   PRAPARE - Administrator, Civil Service (Medical): No    Lack of Transportation (Non-Medical): No  Physical Activity: Insufficiently Active (02/28/2023)   Exercise Vital Sign     Days of Exercise per Week: 4 days    Minutes of Exercise per Session: 30 min  Stress: No Stress Concern Present (02/28/2023)   [name] of Occupational Health - Occupational Stress Questionnaire    Feeling of Stress : Not at all  Social Connections: Unknown (02/28/2023)   Social Connection and Isolation Panel [NHANES]    Frequency of Communication with Friends and Family: Twice a week    Frequency of Social Gatherings with Friends and Family: Not on file    Attends Religious Services: Never    Database administrator or Organizations: No    Attends Banker Meetings: Never    Marital Status: Widowed    Tobacco Counseling Counseling given: Not Answered   Clinical Intake:  Pre-visit preparation completed: Yes  Pain : No/denies pain  Nutritional Risks: None Diabetes: Yes CBG done?: No Did pt. bring in CBG monitor from home?: No  How often do you need to have someone help you when you read instructions, pamphlets, or other written materials from your doctor or pharmacy?: 1 - Never  Diabetic?yes Nutrition Risk Assessment:  Has the patient had any N/V/D within the last 2 months?  No  Does the patient have any non-healing wounds?  No  Has the patient had any unintentional weight loss or weight gain?  No   Diabetes:  Is the patient diabetic?  Yes  If diabetic, was a CBG obtained today?  No  Did the patient bring in their glucometer from home?  No  How often do you monitor your CBG's? periodically.   Financial Strains and Diabetes Management:  Are you having any financial strains with the device, your supplies or your medication? No .  Does the patient want to be seen by Chronic Care Management for management of their diabetes?  No  Would the patient like to be referred to a Nutritionist or for Diabetic Management?  No   Diabetic Exams:  Diabetic Eye Exam: Completed 11/22/21.  Pt has been advised about the importance in completing this exam.   Diabetic Foot Exam: Completed 01/13/23. Pt has been advised about the importance in completing this exam.   Interpreter Needed?: No  Information entered by :: Kennedy Bucker, LPN   Activities of Daily Living    02/28/2023   11:30 AM 12/21/2022    1:16 PM  In your present state of health, do you have any difficulty performing the following activities:  Hearing? 0   Vision? 0   Difficulty concentrating or making decisions? 0   Walking or climbing stairs? 0   Dressing or bathing? 0   Doing errands, shopping? 0 0  Preparing Food and eating ? N   Using the Toilet? N   In the past six months, have you accidently leaked urine? N   Do you have problems with loss of bowel control? N   Managing your Medications? N   Managing your Finances? N   Housekeeping or managing your Housekeeping? N     Patient Care Team: Marjie Skiff, NP as PCP - General (Nurse Practitioner)  Indicate any recent Medical Services you may have received from other than [facility] providers in the past year (date may be approximate).     Assessment:   This is a routine wellness examination for Dillon [name].  Hearing/Vision screen Hearing Screening - Comments:: No aids Vision Screening - Comments:: readers  Dietary issues and exercise activities discussed: Current Exercise Habits: Home exercise routine, Type of exercise: walking, Time (Minutes): 30, Frequency (Times/Week): 4, Weekly Exercise (Minutes/Week): 120, Intensity: Mild   Goals Addressed             This Visit's Progress    DIET - EAT MORE FRUITS AND VEGETABLES         Depression Screen    02/28/2023   11:27 AM 01/13/2023    9:20 AM 12/14/2022    3:07 PM 11/29/2022    9:55 AM 10/11/2022   12:16 PM 04/08/2022    1:58 PM 02/10/2022   11:21 AM  PHQ 2/9 Scores  PHQ - 2 Score 0 2 1  5  0 0  PHQ- 9 Score 0 4 2  16 4  0     Information is confidential and restricted. Go to Review Flowsheets to unlock data.    Fall Risk  02/28/2023   11:29 AM  12/14/2022    3:07 PM 02/10/2022   11:07 AM 11/02/2021    3:14 PM 02/08/2021   11:22 AM  Fall Risk   Falls in the past year? 0 0 0 0 0  Number falls in past yr: 0 0 0 0   Injury with Fall? 0 0 0 0   Risk for fall due to : No Fall Risks No Fall Risks  No Fall Risks No Fall Risks  Follow up Falls prevention discussed;Falls evaluation completed Falls evaluation completed Falls evaluation completed;Education provided;Falls prevention discussed Falls evaluation completed Falls evaluation completed;Education provided;Falls prevention discussed    FALL RISK PREVENTION PERTAINING TO THE HOME:  Any stairs in or around the home? No  If so, are there any without handrails? No  Home free of loose throw rugs in walkways, pet beds, electrical cords, etc? Yes  Adequate lighting in your home to reduce risk of falls? Yes   ASSISTIVE DEVICES UTILIZED TO PREVENT FALLS:  Life alert? No  Use of a cane, walker or w/c? No  Grab bars in the bathroom? No  Shower chair or bench in shower? No  Elevated toilet seat or a handicapped toilet? No    Cognitive Function:    04/17/2017    9:04 AM 03/31/2017    9:03 AM 03/24/2017    9:29 AM 01/27/2016    9:16 AM 01/20/2016    8:27 AM  MMSE - Mini Mental State Exam  Orientation to time       Orientation to Place       Registration       Attention/ Calculation       Recall       Language- name 2 objects       Language- repeat       Language- follow 3 step command       Language- read & follow direction       Write a sentence       Copy design       Total score          Information is confidential and restricted. Go to Review Flowsheets to unlock data.        02/28/2023   11:34 AM 02/10/2022   11:05 AM 02/08/2021   11:24 AM  6CIT Screen  What Year? 0 points 0 points 0 points  What month? 0 points 0 points 0 points  What time? 0 points 0 points 0 points  Count back from 20 0 points 0 points 0 points  Months in reverse 2 points 2 points 0 points  Repeat  phrase 0 points 4 points 4 points  Total Score 2 points 6 points 4 points    Immunizations  There is no immunization history on file for this patient.  TDAP status: Due, Education has been provided regarding the importance of this vaccine. Advised may receive this vaccine at local pharmacy or Health Dept. Aware to provide a copy of the vaccination record if obtained from local pharmacy or Health Dept. Verbalized acceptance and understanding.  Flu Vaccine status: Declined, Education has been provided regarding the importance of this vaccine but patient still declined. Advised may receive this vaccine at local pharmacy or Health Dept. Aware to provide a copy of the vaccination record if obtained from local pharmacy or Health Dept. Verbalized acceptance and understanding.  Pneumococcal vaccine status: Declined,  Education has been provided regarding the importance of this vaccine but patient still declined. Advised  may receive this vaccine at local pharmacy or Health Dept. Aware to provide a copy of the vaccination record if obtained from local pharmacy or Health Dept. Verbalized acceptance and understanding.   Covid-19 vaccine status: Declined, Education has been provided regarding the importance of this vaccine but patient still declined. Advised may receive this vaccine at local pharmacy or Health Dept.or vaccine clinic. Aware to provide a copy of the vaccination record if obtained from local pharmacy or Health Dept. Verbalized acceptance and understanding.  Qualifies for Shingles Vaccine? Yes   Zostavax completed No   Shingrix Completed?: No.    Education has been provided regarding the importance of this vaccine. Patient has been advised to call insurance company to determine out of pocket expense if they have not yet received this vaccine. Advised may also receive vaccine at local pharmacy or Health Dept. Verbalized acceptance and understanding.  Screening Tests Health Maintenance  Topic  Date Due   COVID-19 Vaccine (1) Never done   DTaP/Tdap/Td (1 - Tdap) Never done   OPHTHALMOLOGY EXAM  11/22/2022   COLONOSCOPY (Pts 45-45yrs Insurance coverage will need to be confirmed)  04/09/2023 (Originally 01/01/1998)   Zoster Vaccines- Shingrix (1 of 2) 04/15/2023 (Originally 01/02/2003)   Pneumonia Vaccine 84+ Years old (1 of 2 - PCV) 01/13/2024 (Originally 01/02/1957)   INFLUENZA VACCINE  05/18/2023   HEMOGLOBIN A1C  07/16/2023   Diabetic kidney evaluation - eGFR measurement  01/13/2024   Diabetic kidney evaluation - Urine ACR  01/13/2024   FOOT EXAM  01/13/2024   Medicare Annual Wellness (AWV)  02/28/2024   Hepatitis C Screening  Completed   HPV VACCINES  Aged Out    Health Maintenance  Health Maintenance Due  Topic Date Due   COVID-19 Vaccine (1) Never done   DTaP/Tdap/Td (1 - Tdap) Never done   OPHTHALMOLOGY EXAM  11/22/2022    Declined referral for colonoscopy  Lung Cancer Screening: (Low Dose CT Chest recommended if Age 27-80 years, 30 pack-year currently smoking OR have quit w/in 15years.) does not qualify.   Additional Screening:  Hepatitis C Screening: does qualify; Completed 09/28/21  Vision Screening: Recommended annual ophthalmology exams for early detection of glaucoma and other disorders of the eye. Is the patient up to date with their annual eye exam?  No  Who is the provider or what is the name of the office in which the patient attends annual eye exams? No one If pt is not established with a provider, would they like to be referred to a provider to establish care? No .   Dental Screening: Recommended annual dental exams for proper oral hygiene  Community Resource Referral / Chronic Care Management: CRR required this visit?  No   CCM required this visit?  No      Plan:     I have personally reviewed and noted the following in the patient's chart:   Medical and social history Use of alcohol, tobacco or illicit drugs  Current medications and  supplements including opioid prescriptions. Patient is not currently taking opioid prescriptions. Functional ability and status Nutritional status Physical activity Advanced directives List of other physicians Hospitalizations, surgeries, and ER visits in previous 12 months Vitals Screenings to include cognitive, depression, and falls Referrals and appointments  In addition, I have reviewed and discussed with patient certain preventive protocols, quality metrics, and best practice recommendations. A written personalized care plan for preventive services as well as general preventive health recommendations were provided to patient.     Dillon [name]  Tommas Olp, LPN   01/23/8118   Nurse Notes: none

## 2023-02-28 NOTE — Patient Instructions (Signed)
Continue Fluvoxamine 100 mg twice a day Continue olanzapine 10 mg at night  Continue trazodone 50 mg at night as needed for insomnia Next appointment: 7/23 at 4:30

## 2023-02-28 NOTE — Patient Instructions (Signed)
Dillon [name] , Thank you for taking time to come for your Medicare Wellness Visit. I appreciate your ongoing commitment to your health goals. Please review the following plan we discussed and let me know if I can assist you in the future.   These are the goals we discussed:  Goals      DIET - EAT MORE FRUITS AND VEGETABLES     Increase physical activity     Patient Stated     02/08/2021, wants to walk more        This is a list of the screening recommended for you and due dates:  Health Maintenance  Topic Date Due   COVID-19 Vaccine (1) Never done   DTaP/Tdap/Td vaccine (1 - Tdap) Never done   Eye exam for diabetics  11/22/2022   Colon Cancer Screening  04/09/2023*   Zoster (Shingles) Vaccine (1 of 2) 04/15/2023*   Pneumonia Vaccine (1 of 2 - PCV) 01/13/2024*   Flu Shot  05/18/2023   Hemoglobin A1C  07/16/2023   Yearly kidney function blood test for diabetes  01/13/2024   Yearly kidney health urinalysis for diabetes  01/13/2024   Complete foot exam   01/13/2024   Medicare Annual Wellness Visit  02/28/2024   Hepatitis C Screening: USPSTF Recommendation to screen - Ages 18-79 yo.  Completed   HPV Vaccine  Aged Out  *Topic was postponed. The date shown is not the original due date.    Advanced directives: no  Conditions/risks identified: none  Next appointment: Follow up in one year for your annual wellness visit. 03/05/24 @ 10:15 am by phone  Preventive Care 65 Years and Older, Male  Preventive care refers to lifestyle choices and visits with your health care provider that can promote health and wellness. What does preventive care include? A yearly physical exam. This is also called an annual well check. Dental exams once or twice a year. Routine eye exams. Ask your health care provider how often you should have your eyes checked. Personal lifestyle choices, including: Daily care of your teeth and gums. Regular physical activity. Eating a healthy diet. Avoiding tobacco and  drug use. Limiting alcohol use. Practicing safe sex. Taking low doses of aspirin every day. Taking vitamin and mineral supplements as recommended by your health care provider. What happens during an annual well check? The services and screenings done by your health care provider during your annual well check will depend on your age, overall health, lifestyle risk factors, and family history of disease. Counseling  Your health care provider may ask you questions about your: Alcohol use. Tobacco use. Drug use. Emotional well-being. Home and relationship well-being. Sexual activity. Eating habits. History of falls. Memory and ability to understand (cognition). Work and work Astronomer. Screening  You may have the following tests or measurements: Height, weight, and BMI. Blood pressure. Lipid and cholesterol levels. These may be checked every 5 years, or more frequently if you are over 79 years old. Skin check. Lung cancer screening. You may have this screening every year starting at age 42 if you have a 30-pack-year history of smoking and currently smoke or have quit within the past 15 years. Fecal occult blood test (FOBT) of the stool. You may have this test every year starting at age 69. Flexible sigmoidoscopy or colonoscopy. You may have a sigmoidoscopy every 5 years or a colonoscopy every 10 years starting at age 25. Prostate cancer screening. Recommendations will vary depending on your family history and other risks. Hepatitis  C blood test. Hepatitis B blood test. Sexually transmitted disease (STD) testing. Diabetes screening. This is done by checking your blood sugar (glucose) after you have not eaten for a while (fasting). You may have this done every 1-3 years. Abdominal aortic aneurysm (AAA) screening. You may need this if you are a current or former smoker. Osteoporosis. You may be screened starting at age 6 if you are at high risk. Talk with your health care provider  about your test results, treatment options, and if necessary, the need for more tests. Vaccines  Your health care provider may recommend certain vaccines, such as: Influenza vaccine. This is recommended every year. Tetanus, diphtheria, and acellular pertussis (Tdap, Td) vaccine. You may need a Td booster every 10 years. Zoster vaccine. You may need this after age 63. Pneumococcal 13-valent conjugate (PCV13) vaccine. One dose is recommended after age 58. Pneumococcal polysaccharide (PPSV23) vaccine. One dose is recommended after age 71. Talk to your health care provider about which screenings and vaccines you need and how often you need them. This information is not intended to replace advice given to you by your health care provider. Make sure you discuss any questions you have with your health care provider. Document Released: 10/30/2015 Document Revised: 06/22/2016 Document Reviewed: 08/04/2015 Elsevier Interactive Patient Education  2017 [employer].  Fall Prevention in the Home Falls can cause injuries. They can happen to people of all ages. There are many things you can do to make your home safe and to help prevent falls. What can I do on the outside of my home? Regularly fix the edges of walkways and driveways and fix any cracks. Remove anything that might make you trip as you walk through a door, such as a raised step or threshold. Trim any bushes or trees on the path to your home. Use bright outdoor lighting. Clear any walking paths of anything that might make someone trip, such as rocks or tools. Regularly check to see if handrails are loose or broken. Make sure that both sides of any steps have handrails. Any raised decks and porches should have guardrails on the edges. Have any leaves, snow, or ice cleared regularly. Use sand or salt on walking paths during winter. Clean up any spills in your garage right away. This includes oil or grease spills. What can I do in the  bathroom? Use night lights. Install grab bars by the toilet and in the tub and shower. Do not use towel bars as grab bars. Use non-skid mats or decals in the tub or shower. If you need to sit down in the shower, use a plastic, non-slip stool. Keep the floor dry. Clean up any water that spills on the floor as soon as it happens. Remove soap buildup in the tub or shower regularly. Attach bath mats securely with double-sided non-slip rug tape. Do not have throw rugs and other things on the floor that can make you trip. What can I do in the bedroom? Use night lights. Make sure that you have a light by your bed that is easy to reach. Do not use any sheets or blankets that are too big for your bed. They should not hang down onto the floor. Have a firm chair that has side arms. You can use this for support while you get dressed. Do not have throw rugs and other things on the floor that can make you trip. What can I do in the kitchen? Clean up any spills right away. Avoid walking  on wet floors. Keep items that you use a lot in easy-to-reach places. If you need to reach something above you, use a strong step stool that has a grab bar. Keep electrical cords out of the way. Do not use floor polish or wax that makes floors slippery. If you must use wax, use non-skid floor wax. Do not have throw rugs and other things on the floor that can make you trip. What can I do with my stairs? Do not leave any items on the stairs. Make sure that there are handrails on both sides of the stairs and use them. Fix handrails that are broken or loose. Make sure that handrails are as long as the stairways. Check any carpeting to make sure that it is firmly attached to the stairs. Fix any carpet that is loose or worn. Avoid having throw rugs at the top or bottom of the stairs. If you do have throw rugs, attach them to the floor with carpet tape. Make sure that you have a light switch at the top of the stairs and the  bottom of the stairs. If you do not have them, ask someone to add them for you. What else can I do to help prevent falls? Wear shoes that: Do not have high heels. Have rubber bottoms. Are comfortable and fit you well. Are closed at the toe. Do not wear sandals. If you use a stepladder: Make sure that it is fully opened. Do not climb a closed stepladder. Make sure that both sides of the stepladder are locked into place. Ask someone to hold it for you, if possible. Clearly mark and make sure that you can see: Any grab bars or handrails. First and last steps. Where the edge of each step is. Use tools that help you move around (mobility aids) if they are needed. These include: Canes. Walkers. Scooters. Crutches. Turn on the lights when you go into a dark area. Replace any light bulbs as soon as they burn out. Set up your furniture so you have a clear path. Avoid moving your furniture around. If any of your floors are uneven, fix them. If there are any pets around you, be aware of where they are. Review your medicines with your doctor. Some medicines can make you feel dizzy. This can increase your chance of falling. Ask your doctor what other things that you can do to help prevent falls. This information is not intended to replace advice given to you by your health care provider. Make sure you discuss any questions you have with your health care provider. Document Released: 07/30/2009 Document Revised: 03/10/2016 Document Reviewed: 11/07/2014 Elsevier Interactive Patient Education  2017 [employer].

## 2023-03-01 ENCOUNTER — Ambulatory Visit
Admission: RE | Admit: 2023-03-01 | Discharge: 2023-03-01 | Disposition: A | Discharge status: Home / Self Care | Payer: Medicare HMO | Source: Ambulatory Visit | Attending: Urology | Admitting: Urology

## 2023-03-01 DIAGNOSIS — N1330 Unspecified hydronephrosis: Secondary | ICD-10-CM | POA: Diagnosis not present

## 2023-03-01 DIAGNOSIS — Z466 Encounter for fitting and adjustment of urinary device: Secondary | ICD-10-CM | POA: Insufficient documentation

## 2023-03-05 NOTE — Patient Instructions (Addendum)
Please take both the Januvia 100 MG daily and the Jardiance 25 MG daily for your diabetes.  Diabetes Mellitus Basics  Diabetes mellitus, or diabetes, is a long-term (chronic) disease. It occurs when the body does not properly use sugar (glucose) that is released from food after you eat. Diabetes mellitus may be caused by one or both of these problems: Your pancreas does not make enough of a hormone called insulin. Your body does not react in a normal way to the insulin that it makes. Insulin lets glucose enter cells in your body. This gives you energy. If you have diabetes, glucose cannot get into cells. This causes high blood glucose (hyperglycemia). How to treat and manage diabetes You may need to take insulin or other diabetes medicines daily to keep your glucose in balance. If you are prescribed insulin, you will learn how to give yourself insulin by injection. You may need to adjust the amount of insulin you take based on the foods that you eat. You will need to check your blood glucose levels using a glucose monitor as told by your health care provider. The readings can help determine if you have low or high blood glucose. Generally, you should have these blood glucose levels: Before meals (preprandial): 80-130 mg/dL (1.6-1.0 mmol/L). After meals (postprandial): below 180 mg/dL (10 mmol/L). Hemoglobin A1c (HbA1c) level: less than 7%. Your health care provider will set treatment goals for you. Keep all follow-up visits. This is important. Follow these instructions at home: Diabetes medicines Take your diabetes medicines every day as told by your health care provider. List your diabetes medicines here: Name of medicine: ______________________________ Amount (dose): _______________ Time (a.m./p.m.): _______________ Notes: ___________________________________ Name of medicine: ______________________________ Amount (dose): _______________ Time (a.m./p.m.): _______________ Notes:  ___________________________________ Name of medicine: ______________________________ Amount (dose): _______________ Time (a.m./p.m.): _______________ Notes: ___________________________________ Insulin If you use insulin, list the types of insulin you use here: Insulin type: ______________________________ Amount (dose): _______________ Time (a.m./p.m.): _______________Notes: ___________________________________ Insulin type: ______________________________ Amount (dose): _______________ Time (a.m./p.m.): _______________ Notes: ___________________________________ Insulin type: ______________________________ Amount (dose): _______________ Time (a.m./p.m.): _______________ Notes: ___________________________________ Insulin type: ______________________________ Amount (dose): _______________ Time (a.m./p.m.): _______________ Notes: ___________________________________ Insulin type: ______________________________ Amount (dose): _______________ Time (a.m./p.m.): _______________ Notes: ___________________________________ Managing blood glucose  Check your blood glucose levels using a glucose monitor as told by your health care provider. Write down the times that you check your glucose levels here: Time: _______________ Notes: ___________________________________ Time: _______________ Notes: ___________________________________ Time: _______________ Notes: ___________________________________ Time: _______________ Notes: ___________________________________ Time: _______________ Notes: ___________________________________ Time: _______________ Notes: ___________________________________  Low blood glucose Low blood glucose (hypoglycemia) is when glucose is at or below 70 mg/dL (3.9 mmol/L). Symptoms may include: Feeling: Hungry. Sweaty and clammy. Irritable or easily upset. Dizzy. Sleepy. Having: A fast heartbeat. A headache. A change in your vision. Numbness around the mouth, lips, or  tongue. Having trouble with: Moving (coordination). Sleeping. Treating low blood glucose To treat low blood glucose, eat or drink something containing sugar right away. If you can think clearly and swallow safely, follow the 15:15 rule: Take 15 grams of a fast-acting carb (carbohydrate), as told by your health care provider. Some fast-acting carbs are: Glucose tablets: take 3-4 tablets. Hard candy: eat 3-5 pieces. Fruit juice: drink 4 oz (120 mL). Regular (not diet) soda: drink 4-6 oz (120-180 mL). Honey or sugar: eat 1 Tbsp (15 mL). Check your blood glucose levels 15 minutes after you take the carb. If your glucose is still at or below 70 mg/dL (3.9 mmol/L), take  15 grams of a carb again. If your glucose does not go above 70 mg/dL (3.9 mmol/L) after 3 tries, get help right away. After your glucose goes back to normal, eat a meal or a snack within 1 hour. Treating very low blood glucose If your glucose is at or below 54 mg/dL (3 mmol/L), you have very low blood glucose (severe hypoglycemia). This is an emergency. Do not wait to see if the symptoms will go away. Get medical help right away. Call your local emergency services (911 in the [place] Do not drive yourself to the hospital. Questions to ask your health care provider Should I talk with a diabetes educator? What equipment will I need to care for myself at home? What diabetes medicines do I need? When should I take them? How often do I need to check my blood glucose levels? What number can I call if I have questions? When is my follow-up visit? Where can I find a support group for people with diabetes? Where to find more information American Diabetes Association: www.diabetes.org Association of Diabetes Care and Education Specialists: www.diabeteseducator.org Contact a health care provider if: Your blood glucose is at or above 240 mg/dL (16.1 mmol/L) for 2 days in a row. You have been sick or have had a fever for 2 days or more,  and you are not getting better. You have any of these problems for more than 6 hours: You cannot eat or drink. You feel nauseous. You vomit. You have diarrhea. Get help right away if: Your blood glucose is lower than 54 mg/dL (3 mmol/L). You get confused. You have trouble thinking clearly. You have trouble breathing. These symptoms may represent a serious problem that is an emergency. Do not wait to see if the symptoms will go away. Get medical help right away. Call your local emergency services (911 in the [place] Do not drive yourself to the hospital. Summary Diabetes mellitus is a chronic disease that occurs when the body does not properly use sugar (glucose) that is released from food after you eat. Take insulin and diabetes medicines as told. Check your blood glucose every day, as often as told. Keep all follow-up visits. This is important. This information is not intended to replace advice given to you by your health care provider. Make sure you discuss any questions you have with your health care provider. Document Revised: 02/04/2020 Document Reviewed: 02/04/2020 Elsevier Patient Education  2023 [employer].

## 2023-03-10 ENCOUNTER — Ambulatory Visit (INDEPENDENT_AMBULATORY_CARE_PROVIDER_SITE_OTHER): Payer: Medicare HMO | Admitting: Nurse Practitioner

## 2023-03-10 ENCOUNTER — Encounter: Payer: Self-pay | Admitting: Nurse Practitioner

## 2023-03-10 VITALS — BP 128/88 | HR 83 | Temp 97.6°F | Ht 69.02 in | Wt 196.0 lb

## 2023-03-10 DIAGNOSIS — E1165 Type 2 diabetes mellitus with hyperglycemia: Secondary | ICD-10-CM

## 2023-03-10 DIAGNOSIS — Z7984 Long term (current) use of oral hypoglycemic drugs: Secondary | ICD-10-CM

## 2023-03-10 MED ORDER — ROSUVASTATIN CALCIUM 10 MG PO TABS: 10.0000 mg | ORAL_TABLET | Freq: Every day | ORAL | 4 refills | Status: DC

## 2023-03-10 NOTE — Assessment & Plan Note (Signed)
Diagnosed December 2022, A1c 9.9% at the time.  A1c March 2024 was 9%, increased from previous 6.3% due to eating a lot of fast food.  We discussed at length need to focus on diet changes with more healthy options vs quick options.  Continue Jardiance 25 MG and restart Januvia 100 MG (he stopped taking and sugars not at goal).  Could consider change to GLP1 in future, however unsure he will perform weekly injection.  Recommend he check BS at home daily and document for next visit so provider can review.  Focus on diabetic diet.  LABS: up to date.  Urine ALB 150 March 2024.  Bring all pill bottles to next visit. - Refuses vaccines - Foot exam up to date and needs eye exam - ACE and statin on board - Could consider retrial of lower dose Metformin XL in future

## 2023-03-10 NOTE — Progress Notes (Signed)
BP 128/88   Pulse 83   Temp 97.6 F (36.4 C) (Oral)   Ht 5' 9.02" (1.753 m)   Wt 196 lb (88.9 kg)   SpO2 96%   BMI 28.93 kg/m    Subjective:    Patient ID: Dillon [name], male    DOB: June 09, 1951, 72 y.o.   MRN: [id number]  HPI: Dillon [name] is a 72 y.o. male  Chief Complaint  Patient presents with   Diabetes   DIABETES A1c 9% March and we increased Jardiance to 25 MG.  Initial diagnosis in December 2022, A1c was 9.9%.  Currently to be taking Jardiance 25 MG daily and Januvia 100 MG daily -- he is not sure if he has consistently taking Januvia, as thought he did not need to take it with Jardiance increase.  Does endorse eating fast food often.  Metformin in past caused issues with diarrhea.  He reports taking medications every day. Hypoglycemic episodes:no Polydipsia/polyuria: none Visual disturbance: no Chest pain: no Paresthesias: no Glucose Monitoring: yes  Accucheck frequency: daily  Fasting glucose: 143 to 208  Post prandial:  Evening:  Before meals: Taking Insulin?: no  Long acting insulin:  Short acting insulin: Blood Pressure Monitoring: not checking Retinal Examination: Not Up To Date -- macular degeneration Foot Exam: Up to Date Pneumovax: Not up to Date - refuses vaccines Influenza: Not up to Date - refuses vaccines Aspirin: no   Relevant past medical, surgical, family and social history reviewed and updated as indicated. Interim medical history since our last visit reviewed. Allergies and medications reviewed and updated.  Review of Systems  Constitutional:  Negative for activity change, diaphoresis, fatigue and fever.  Respiratory:  Negative for cough, chest tightness, shortness of breath and wheezing.   Cardiovascular:  Negative for chest pain, palpitations and leg swelling.  Gastrointestinal: Negative.   Neurological: Negative.   Psychiatric/Behavioral:  Negative for decreased concentration, self-injury, sleep disturbance and suicidal  ideas. The patient is not nervous/anxious.     Per HPI unless specifically indicated above     Objective:    BP 128/88   Pulse 83   Temp 97.6 F (36.4 C) (Oral)   Ht 5' 9.02" (1.753 m)   Wt 196 lb (88.9 kg)   SpO2 96%   BMI 28.93 kg/m   Wt Readings from Last 3 Encounters:  03/10/23 196 lb (88.9 kg)  02/28/23 193 lb (87.5 kg)  01/26/23 190 lb (86.2 kg)    Physical Exam Vitals and nursing note reviewed.  Constitutional:      General: He is awake. He is not in acute distress.    Appearance: He is well-developed and well-groomed. He is obese. He is not ill-appearing.  HENT:     Head: Normocephalic and atraumatic.     Right Ear: Hearing normal. No drainage.     Left Ear: Hearing normal. No drainage.  Eyes:     General: Lids are normal.        Right eye: No discharge.        Left eye: No discharge.     Conjunctiva/sclera: Conjunctivae normal.     Pupils: Pupils are equal, round, and reactive to light.  Neck:     Thyroid: No thyromegaly.     Vascular: No carotid bruit.     Trachea: Trachea normal.  Cardiovascular:     Rate and Rhythm: Normal rate and regular rhythm.     Heart sounds: Normal heart sounds, S1 normal and S2 normal. No murmur  heard.    No gallop.  Pulmonary:     Effort: Pulmonary effort is normal. No accessory muscle usage or respiratory distress.     Breath sounds: Normal breath sounds.  Abdominal:     General: Bowel sounds are normal.     Palpations: Abdomen is soft.  Musculoskeletal:        General: Normal range of motion.     Cervical back: Normal range of motion and neck supple.     Right lower leg: No edema.     Left lower leg: No edema.  Skin:    General: Skin is warm and dry.     Capillary Refill: Capillary refill takes less than 2 seconds.  Neurological:     Mental Status: He is alert and oriented to person, place, and time.     Deep Tendon Reflexes: Reflexes are normal and symmetric.  Psychiatric:        Attention and Perception:  Attention normal.        Mood and Affect: Mood normal.        Speech: Speech normal.        Behavior: Behavior normal. Behavior is cooperative.        Thought Content: Thought content normal.    Results for orders placed or performed in visit on 01/26/23  CULTURE, URINE COMPREHENSIVE   Specimen: Urine   UR  Result Value Ref Range   Urine Culture, Comprehensive Final report (A)    Organism ID, Bacteria Enterococcus faecalis (A)    Organism ID, Bacteria Not applicable    ANTIMICROBIAL SUSCEPTIBILITY Comment   Microscopic Examination   Urine  Result Value Ref Range   WBC, UA >30 (A) 0 - 5 /hpf   RBC, Urine >30 (A) 0 - 2 /hpf   Epithelial Cells (non renal) 0-10 0 - 10 /hpf   Bacteria, UA Many (A) None seen/Few  Urinalysis, Complete  Result Value Ref Range   Specific Gravity, UA 1.020 1.005 - 1.030   pH, UA 5.5 5.0 - 7.5   Color, UA Yellow Yellow   Appearance Ur Cloudy (A) Clear   Leukocytes,UA 1+ (A) Negative   Protein,UA 2+ (A) Negative/Trace   Glucose, UA 3+ (A) Negative   Ketones, UA 3+ (A) Negative   RBC, UA 3+ (A) Negative   Bilirubin, UA Negative Negative   Urobilinogen, Ur 0.2 0.2 - 1.0 mg/dL   Nitrite, UA Negative Negative   Microscopic Examination See below:       Assessment & Plan:   Problem List Items Addressed This Visit       Endocrine   Type 2 diabetes mellitus with hyperglycemia, without long-term current use of insulin (HCC) - Primary    Diagnosed December 2022, A1c 9.9% at the time.  A1c March 2024 was 9%, increased from previous 6.3% due to eating a lot of fast food.  We discussed at length need to focus on diet changes with more healthy options vs quick options.  Continue Jardiance 25 MG and restart Januvia 100 MG (he stopped taking and sugars not at goal).  Could consider change to GLP1 in future, however unsure he will perform weekly injection.  Recommend he check BS at home daily and document for next visit so provider can review.  Focus on diabetic  diet.  LABS: up to date.  Urine ALB 150 March 2024.  Bring all pill bottles to next visit. - Refuses vaccines - Foot exam up to date and needs eye exam - ACE  and statin on board - Could consider retrial of lower dose Metformin XL in future      Relevant Medications   rosuvastatin (CRESTOR) 10 MG tablet     Follow up plan: Return in about 5 weeks (around 04/17/2023) for T2DM, HTN/HLD, VIT D and B12.

## 2023-03-23 ENCOUNTER — Other Ambulatory Visit: Payer: Self-pay | Admitting: *Deleted

## 2023-03-23 ENCOUNTER — Other Ambulatory Visit: Payer: Self-pay | Admitting: Nurse Practitioner

## 2023-03-23 MED ORDER — TAMSULOSIN HCL 0.4 MG PO CAPS: 0.4000 mg | ORAL_CAPSULE | Freq: Every day | ORAL | 11 refills | Status: DC

## 2023-03-23 NOTE — Telephone Encounter (Signed)
Medication Refill - Medication: lisinopril (ZESTRIL) 5 MG tablet   Has the patient contacted their pharmacy? Yes.     Preferred Pharmacy (with phone number or street name):  [id number] - [place], [place] - [address] AT [facility] OF [address] [facility] Phone: [phone]  Fax: [phone]      Has the patient been seen for an appointment in the last year OR does the patient have an upcoming appointment? Yes.    Patient assist patient further

## 2023-03-23 NOTE — Telephone Encounter (Signed)
Requested medications are due for refill today.  unsure  Requested medications are on the active medications list.  yes  Last refill. 12/13/2022  Future visit scheduled.   yes  Notes to clinic.  Medication listed as historical.    Requested Prescriptions  Pending Prescriptions Disp Refills   lisinopril (ZESTRIL) 5 MG tablet      Sig: Take 1 tablet (5 mg total) by mouth daily.     Cardiovascular:  ACE Inhibitors Passed - 03/23/2023 12:24 PM      Passed - Cr in normal range and within 180 days    Creatinine  Date Value Ref Range Status  12/01/2013 1.17 0.60 - 1.30 mg/dL Final   Creatinine, Ser  Date Value Ref Range Status  01/13/2023 1.12 0.76 - 1.27 mg/dL Final         Passed - K in normal range and within 180 days    Potassium  Date Value Ref Range Status  01/13/2023 4.6 3.5 - 5.2 mmol/L Final  12/01/2013 3.7 3.5 - 5.1 mmol/L Final         Passed - Patient is not pregnant      Passed - Last BP in normal range    BP Readings from Last 1 Encounters:  03/10/23 128/88         Passed - Valid encounter within last 6 months    Recent Outpatient Visits           1 week ago Type 2 diabetes mellitus with hyperglycemia, without long-term current use of insulin (HCC)   [place] [facility] [place], [place] T, NP   2 months ago Type 2 diabetes mellitus with hyperglycemia, without long-term current use of insulin (HCC)   [place] [facility] [place], [place] T, NP   3 months ago Severe recurrent major depression without psychotic features (HCC)   [place] [facility] [place], Jolene T, NP   5 months ago Type 2 diabetes mellitus with proteinuria (HCC)   [place] [facility] [place], [place] T, NP   11 months ago Type 2 diabetes mellitus with proteinuria (HCC)   [place] [facility] [place], Dorie Rank, NP       Future Appointments             In 6 days Stoioff, Verna Czech, MD [facility]  [place]   In 3 weeks [place], Dorie Rank, NP [facility], [facility]

## 2023-03-24 MED ORDER — LISINOPRIL 5 MG PO TABS: 5.0000 mg | ORAL_TABLET | Freq: Every day | ORAL | 4 refills | Status: DC

## 2023-03-29 ENCOUNTER — Encounter: Payer: Self-pay | Admitting: Urology

## 2023-03-29 ENCOUNTER — Ambulatory Visit (INDEPENDENT_AMBULATORY_CARE_PROVIDER_SITE_OTHER): Payer: Medicare HMO | Admitting: Urology

## 2023-03-29 VITALS — BP 138/84 | HR 76 | Ht 69.0 in | Wt 195.0 lb

## 2023-03-29 DIAGNOSIS — Z87442 Personal history of urinary calculi: Secondary | ICD-10-CM

## 2023-03-29 DIAGNOSIS — N1330 Unspecified hydronephrosis: Secondary | ICD-10-CM

## 2023-03-29 DIAGNOSIS — N200 Calculus of kidney: Secondary | ICD-10-CM

## 2023-03-29 NOTE — Progress Notes (Signed)
I, Dillon [name], acting as a Neurosurgeon for Dillon Altes, MD., have documented all relevant documentation on the behalf of Dillon Altes, MD, as directed by  Dillon Altes, MD while in the presence of Dillon Altes, MD.  03/29/2023 10:59 AM   Dillon [name] September 11, 1951 [id number]  Referring provider: Marjie Skiff, NP [address] [place],  [place] [address]  Chief Complaint  Patient presents with   Nephrolithiasis   Urologic History: Nephrolithiasis  HPI: 72 y.o. male presents for a follow-up visit.   Status post staged ureteroscopic removal of a right partial staghorn calculus with obstruction [name] [facility] 2/27 and 3/12. Stent removed 01/26/2023 and a 2 month follow up renal ultrasound was recommended, which was performed 03/01/2023. There was mild hydronephrosis of the right kidney. He has no complaints today, and denies flank/abdominal/pelvic pain or gross hematuria.   PMH: Past Medical History:  Diagnosis Date   Anomalies of urachus, congenital    BPH with urinary obstruction    Depression    Diabetes mellitus without complication (HCC)    History of kidney stones    Hydronephrosis    Hyperlipidemia    Hypernatremia    Hypokalemia    Kidney stone    Macular degeneration    Meningitis    spinal   OCD (obsessive compulsive disorder)    Protein-calorie malnutrition, severe (HCC)    Shingles    Thrombocytopenia (HCC)     Surgical History: Past Surgical History:  Procedure Laterality Date   CYSTOSCOPY W/ URETERAL STENT PLACEMENT Right 10/12/2022   Procedure: CYSTOSCOPY WITH RETROGRADE PYELOGRAM/URETERAL STENT PLACEMENT;  Surgeon: Dillon Altes, MD;  Location: [facility] ORS;  Service: Urology;  Laterality: Right;   CYSTOSCOPY/URETEROSCOPY/HOLMIUM LASER/STENT PLACEMENT Right 12/13/2022   Procedure: CYSTOSCOPY/URETEROSCOPY/HOLMIUM LASER/STENT EXCHANGE;  Surgeon: Dillon Altes, MD;  Location: [facility] ORS;  Service: Urology;  Laterality: Right;    CYSTOSCOPY/URETEROSCOPY/HOLMIUM LASER/STENT PLACEMENT Right 12/27/2022   Procedure: CYSTOSCOPY/URETEROSCOPY/HOLMIUM LASER/STENT EXCHANGE;  Surgeon: Dillon Altes, MD;  Location: [facility] ORS;  Service: Urology;  Laterality: Right;   KIDNEY STONE SURGERY      Home Medications:  Allergies as of 03/29/2023       Reactions   Codeine Other (See Comments)   Unsure of reaction        Medication List        Accurate as of March 29, 2023 10:59 AM. If you have any questions, ask your nurse or doctor.          Accu-Chek Guide test strip Generic drug: glucose blood Use to check blood sugars 2-3 times daily with goals = <130 fasting in morning and <180 two hours after eating. Bring blood sugar log to appointments.   cyanocobalamin 1000 MCG tablet Commonly known as: VITAMIN B12 Take 1 tablet (1,000 mcg total) by mouth daily.   empagliflozin 25 MG Tabs tablet Commonly known as: Jardiance Take 1 tablet (25 mg total) by mouth daily before breakfast.   fluvoxaMINE 100 MG tablet Commonly known as: LUVOX Take 1 tablet (100 mg total) by mouth 2 (two) times daily [name] 8 am and 4 pm.   ICAPS AREDS 2 PO Take 1 tablet by mouth 2 (two) times daily.   lisinopril 5 MG tablet Commonly known as: ZESTRIL Take 1 tablet (5 mg total) by mouth daily.   OLANZapine 10 MG tablet Commonly known as: ZYPREXA Take 1 tablet (10 mg total) by mouth [name] bedtime.   oxybutynin 5 MG tablet Commonly  known as: DITROPAN 1 tab tid prn frequency,urgency, bladder spasm   rosuvastatin 10 MG tablet Commonly known as: CRESTOR Take 1 tablet (10 mg total) by mouth [name] bedtime.   sitaGLIPtin 100 MG tablet Commonly known as: Januvia Take 1 tablet (100 mg total) by mouth daily.   tamsulosin 0.4 MG Caps capsule Commonly known as: FLOMAX Take 1 capsule (0.4 mg total) by mouth daily after breakfast.   traZODone 50 MG tablet Commonly known as: DESYREL Take 1 tablet (50 mg total) by mouth [name] bedtime.   Vitamin D  (Ergocalciferol) 1.25 MG (50000 UNIT) Caps capsule Commonly known as: DRISDOL Take 1 capsule (50,000 Units total) by mouth every 7 (seven) days. What changed: additional instructions        Allergies:  Allergies  Allergen Reactions   Codeine Other (See Comments)    Unsure of reaction    Family History: Family History  Problem Relation Age of Onset   Cancer Mother        bladder   Emphysema Father    Emphysema Sister    COPD Sister    Cancer Brother        prostate    Social History:  reports that he has never smoked. He has never used smokeless tobacco. He reports that he does not drink alcohol and does not use drugs.   Physical Exam: BP 138/84   Pulse 76   Ht 5\' 9"  (1.753 m)   Wt 195 lb (88.5 kg)   BMI 28.80 kg/m   Constitutional:  Alert, No acute distress. HEENT: Dillon [name] Respiratory: Normal respiratory effort, no increased work of breathing. Psychiatric: Normal mood and affect.   Pertinent Imaging: Personally reviewed and interpreted.   Narrative & Impression  CLINICAL DATA:  Removal of ureteral stent   EXAM: RENAL / URINARY TRACT ULTRASOUND COMPLETE   COMPARISON:  CT abdomen pelvis 10/11/2022   FINDINGS: Right Kidney:   Renal measurements: 11.9 x 5.1 x 5.5 cm = volume: 173.2 mL. Normal renal cortical thickness and echogenicity. Mild hydronephrosis. No renal mass.   Left Kidney:   Renal measurements: 12.4 x 6.2 x 5.7 cm = volume: 231.2 mL. Echogenicity within normal limits. No mass or hydronephrosis visualized.   Bladder:   Appears normal for degree of bladder distention.   Other:   None.   IMPRESSION: Mild right hydronephrosis.     Electronically Signed   By: Dillon [name] M.D.   On: 03/01/2023 14:56    Assessment & Plan:    1. Personal history of urinary calculi Preoperatively, he had severe hydronephrosis from long standing obstruction and recent ultrasound showing mild hydronephrosis. Felt to be baseline postoperatively. He  will call for development of flank/abdominal pain. Schedule 6 month follow up with repeat renal ultrasound prior.  I have reviewed the above documentation for accuracy and completeness, and I agree with the above.   Dillon Altes, MD  [facility] [address] [place], [place] [address] [phone]

## 2023-04-03 ENCOUNTER — Other Ambulatory Visit: Payer: Self-pay | Admitting: Nurse Practitioner

## 2023-04-04 NOTE — Telephone Encounter (Signed)
Unable to refill per protocol, Rx request is too soon. Last refill 12/14/22 for 90 and 4 refills.  Requested Prescriptions  Pending Prescriptions Disp Refills   JANUVIA 100 MG tablet [Pharmacy Med Name: JANUVIA 100MG  TABLETS] 30 tablet     Sig: TAKE 1 TABLET(100 MG) BY MOUTH DAILY     Endocrinology:  Diabetes - DPP-4 Inhibitors Failed - 04/03/2023 11:26 AM      Failed - HBA1C is between 0 and 7.9 and within 180 days    HB A1C (BAYER DCA - WAIVED)  Date Value Ref Range Status  01/13/2023 9.0 (H) 4.8 - 5.6 % Final    Comment:             Prediabetes: 5.7 - 6.4          Diabetes: >6.4          Glycemic control for adults with diabetes: <7.0          Passed - Cr in normal range and within 360 days    Creatinine  Date Value Ref Range Status  12/01/2013 1.17 0.60 - 1.30 mg/dL Final   Creatinine, Ser  Date Value Ref Range Status  01/13/2023 1.12 0.76 - 1.27 mg/dL Final         Passed - Valid encounter within last 6 months    Recent Outpatient Visits           3 weeks ago Type 2 diabetes mellitus with hyperglycemia, without long-term current use of insulin (HCC)   [place] [facility] [place], [place] T, NP   2 months ago Type 2 diabetes mellitus with hyperglycemia, without long-term current use of insulin (HCC)   [place] [facility] [place], [place] T, NP   3 months ago Severe recurrent major depression without psychotic features (HCC)   [place] [facility] [place], Jolene T, NP   5 months ago Type 2 diabetes mellitus with proteinuria (HCC)   [place] [facility] [place], [place] T, NP   12 months ago Type 2 diabetes mellitus with proteinuria (HCC)   [place] [facility] [place], Dorie Rank, NP       Future Appointments             In 1 week [place], Dorie Rank, NP [place] [facility], [facility]   In 5 months Stoioff, Verna Czech, MD [facility] [place]

## 2023-04-12 NOTE — Patient Instructions (Signed)
Diabetes Mellitus Basics  Diabetes mellitus, or diabetes, is a long-term (chronic) disease. It occurs when the body does not properly use sugar (glucose) that is released from food after you eat. Diabetes mellitus may be caused by one or both of these problems: Your pancreas does not make enough of a hormone called insulin. Your body does not react in a normal way to the insulin that it makes. Insulin lets glucose enter cells in your body. This gives you energy. If you have diabetes, glucose cannot get into cells. This causes high blood glucose (hyperglycemia). How to treat and manage diabetes You may need to take insulin or other diabetes medicines daily to keep your glucose in balance. If you are prescribed insulin, you will learn how to give yourself insulin by injection. You may need to adjust the amount of insulin you take based on the foods that you eat. You will need to check your blood glucose levels using a glucose monitor as told by your health care provider. The readings can help determine if you have low or high blood glucose. Generally, you should have these blood glucose levels: Before meals (preprandial): 80-130 mg/dL (4.4-7.2 mmol/L). After meals (postprandial): below 180 mg/dL (10 mmol/L). Hemoglobin A1c (HbA1c) level: less than 7%. Your health care provider will set treatment goals for you. Keep all follow-up visits. This is important. Follow these instructions at home: Diabetes medicines Take your diabetes medicines every day as told by your health care provider. List your diabetes medicines here: Name of medicine: ______________________________ Amount (dose): _______________ Time (a.m./p.m.): _______________ Notes: ___________________________________ Name of medicine: ______________________________ Amount (dose): _______________ Time (a.m./p.m.): _______________ Notes: ___________________________________ Name of medicine: ______________________________ Amount (dose):  _______________ Time (a.m./p.m.): _______________ Notes: ___________________________________ Insulin If you use insulin, list the types of insulin you use here: Insulin type: ______________________________ Amount (dose): _______________ Time (a.m./p.m.): _______________Notes: ___________________________________ Insulin type: ______________________________ Amount (dose): _______________ Time (a.m./p.m.): _______________ Notes: ___________________________________ Insulin type: ______________________________ Amount (dose): _______________ Time (a.m./p.m.): _______________ Notes: ___________________________________ Insulin type: ______________________________ Amount (dose): _______________ Time (a.m./p.m.): _______________ Notes: ___________________________________ Insulin type: ______________________________ Amount (dose): _______________ Time (a.m./p.m.): _______________ Notes: ___________________________________ Managing blood glucose  Check your blood glucose levels using a glucose monitor as told by your health care provider. Write down the times that you check your glucose levels here: Time: _______________ Notes: ___________________________________ Time: _______________ Notes: ___________________________________ Time: _______________ Notes: ___________________________________ Time: _______________ Notes: ___________________________________ Time: _______________ Notes: ___________________________________ Time: _______________ Notes: ___________________________________  Low blood glucose Low blood glucose (hypoglycemia) is when glucose is at or below 70 mg/dL (3.9 mmol/L). Symptoms may include: Feeling: Hungry. Sweaty and clammy. Irritable or easily upset. Dizzy. Sleepy. Having: A fast heartbeat. A headache. A change in your vision. Numbness around the mouth, lips, or tongue. Having trouble with: Moving (coordination). Sleeping. Treating low blood glucose To treat low blood  glucose, eat or drink something containing sugar right away. If you can think clearly and swallow safely, follow the 15:15 rule: Take 15 grams of a fast-acting carb (carbohydrate), as told by your health care provider. Some fast-acting carbs are: Glucose tablets: take 3-4 tablets. Hard candy: eat 3-5 pieces. Fruit juice: drink 4 oz (120 mL). Regular (not diet) soda: drink 4-6 oz (120-180 mL). Honey or sugar: eat 1 Tbsp (15 mL). Check your blood glucose levels 15 minutes after you take the carb. If your glucose is still at or below 70 mg/dL (3.9 mmol/L), take 15 grams of a carb again. If your glucose does not go above 70 mg/dL (3.9 mmol/L) after   3 tries, get help right away. After your glucose goes back to normal, eat a meal or a snack within 1 hour. Treating very low blood glucose If your glucose is at or below 54 mg/dL (3 mmol/L), you have very low blood glucose (severe hypoglycemia). This is an emergency. Do not wait to see if the symptoms will go away. Get medical help right away. Call your local emergency services (911 in the [place] Do not drive yourself to the hospital. Questions to ask your health care provider Should I talk with a diabetes educator? What equipment will I need to care for myself at home? What diabetes medicines do I need? When should I take them? How often do I need to check my blood glucose levels? What number can I call if I have questions? When is my follow-up visit? Where can I find a support group for people with diabetes? Where to find more information American Diabetes Association: www.diabetes.org Association of Diabetes Care and Education Specialists: www.diabeteseducator.org Contact a health care provider if: Your blood glucose is at or above 240 mg/dL (13.3 mmol/L) for 2 days in a row. You have been sick or have had a fever for 2 days or more, and you are not getting better. You have any of these problems for more than 6 hours: You cannot eat or  drink. You feel nauseous. You vomit. You have diarrhea. Get help right away if: Your blood glucose is lower than 54 mg/dL (3 mmol/L). You get confused. You have trouble thinking clearly. You have trouble breathing. These symptoms may represent a serious problem that is an emergency. Do not wait to see if the symptoms will go away. Get medical help right away. Call your local emergency services (911 in the [place] Do not drive yourself to the hospital. Summary Diabetes mellitus is a chronic disease that occurs when the body does not properly use sugar (glucose) that is released from food after you eat. Take insulin and diabetes medicines as told. Check your blood glucose every day, as often as told. Keep all follow-up visits. This is important. This information is not intended to replace advice given to you by your health care provider. Make sure you discuss any questions you have with your health care provider. Document Revised: 02/04/2020 Document Reviewed: 02/04/2020 Elsevier Patient Education  2024 Elsevier Inc.  

## 2023-04-14 ENCOUNTER — Ambulatory Visit (INDEPENDENT_AMBULATORY_CARE_PROVIDER_SITE_OTHER): Payer: Medicare HMO | Admitting: Nurse Practitioner

## 2023-04-14 ENCOUNTER — Encounter: Payer: Self-pay | Admitting: Nurse Practitioner

## 2023-04-14 VITALS — BP 116/80 | HR 96 | Temp 97.5°F | Ht 69.0 in | Wt 203.0 lb

## 2023-04-14 DIAGNOSIS — E1165 Type 2 diabetes mellitus with hyperglycemia: Secondary | ICD-10-CM

## 2023-04-14 DIAGNOSIS — D696 Thrombocytopenia, unspecified: Secondary | ICD-10-CM

## 2023-04-14 DIAGNOSIS — E559 Vitamin D deficiency, unspecified: Secondary | ICD-10-CM | POA: Diagnosis not present

## 2023-04-14 DIAGNOSIS — E785 Hyperlipidemia, unspecified: Secondary | ICD-10-CM | POA: Diagnosis not present

## 2023-04-14 DIAGNOSIS — F332 Major depressive disorder, recurrent severe without psychotic features: Secondary | ICD-10-CM

## 2023-04-14 DIAGNOSIS — E43 Unspecified severe protein-calorie malnutrition: Secondary | ICD-10-CM

## 2023-04-14 DIAGNOSIS — E1169 Type 2 diabetes mellitus with other specified complication: Secondary | ICD-10-CM

## 2023-04-14 DIAGNOSIS — F422 Mixed obsessional thoughts and acts: Secondary | ICD-10-CM

## 2023-04-14 DIAGNOSIS — E538 Deficiency of other specified B group vitamins: Secondary | ICD-10-CM | POA: Diagnosis not present

## 2023-04-14 DIAGNOSIS — Z7984 Long term (current) use of oral hypoglycemic drugs: Secondary | ICD-10-CM

## 2023-04-14 DIAGNOSIS — N1330 Unspecified hydronephrosis: Secondary | ICD-10-CM

## 2023-04-14 LAB — BAYER DCA HB A1C WAIVED: HB A1C (BAYER DCA - WAIVED): 7.2 % — ABNORMAL HIGH (ref 4.8–5.6)

## 2023-04-14 MED ORDER — VITAMIN B-12 1000 MCG PO TABS: 1000.0000 ug | ORAL_TABLET | Freq: Every day | ORAL | 4 refills | Status: DC

## 2023-04-14 MED ORDER — VITAMIN D (ERGOCALCIFEROL) 1.25 MG (50000 UNIT) PO CAPS: 50000.0000 [IU] | ORAL_CAPSULE | ORAL | 4 refills | Status: DC

## 2023-04-14 NOTE — Assessment & Plan Note (Signed)
Chronic, ongoing.  Not consistently taking supplement, ran out.  Refills sent in today and educated him on this.

## 2023-04-14 NOTE — Assessment & Plan Note (Signed)
Diagnosed December 2022, A1c 9.9% at the time.  A1c today has trended down to 7.2% from 9%, with increase in Jardiance.  Did not tolerate Metformin in past, severe diarrhea. - Continue Jardiance 25 MG and Januvia 100 MG as they are offering benefit.  Could consider change to GLP1 in future, however unsure he will perform weekly injection, could do Rybelsus.  Recommend he check BS at home daily and document for next visit so provider can review.  Focus on diabetic diet.  LABS: CMP.  Urine ALB 150 March 2024.  Bring all pill bottles to next visit. - Refuses vaccines - Foot exam up to date and needs eye exam -- recommend he schedule ASAP - ACE and statin on board

## 2023-04-14 NOTE — Assessment & Plan Note (Signed)
Refer to depression plan of care. 

## 2023-04-14 NOTE — Assessment & Plan Note (Addendum)
Chronic, ongoing.  Denies SI/HI.  Overall mood much stable today and scores improved.  Will continue current medication regimen and collaboration with psychiatry.  Obtain EKG as needed dependent on psychiatry recommendations, annual checks.

## 2023-04-14 NOTE — Assessment & Plan Note (Signed)
Chronic, ongoing.  Will continue Rosuvastatin 10 MG daily as is tolerating and monitor, adjust as needed.  Lipid panel and CMP today. 

## 2023-04-14 NOTE — Progress Notes (Signed)
BP 116/80   Pulse 96   Temp (!) 97.5 F (36.4 C) (Oral)   Ht 5\' 9"  (1.753 m)   Wt 203 lb (92.1 kg)   SpO2 96%   BMI 29.98 kg/m    Subjective:    Patient ID: Dillon [name], male    DOB: [birth year], 72 y.o.   MRN: [id number]  HPI: Dillon [name] is a 72 y.o. male  Chief Complaint  Patient presents with   Diabetes    Patient declines having a recent Diabetic Eye Exam.    Hyperlipidemia   Hypertension   DIABETES A1c in March was 9%, he has recently gotten over illness.  Initial diagnosis in December 2022, A1c was 9.9% at the time.  Currently to be taking Jardiance 25 MG daily and Januvia 100 MG daily.  Does endorse eating fast food often, went for biscuit this morning.  Metformin in past caused issues with frequent diarrhea.  Is to be taking Vitamin D and B12 supplements for low levels, but currently is not taking.  He reports Vitamin D has been long run out. Hypoglycemic episodes:no Polydipsia/polyuria: none Visual disturbance: no Chest pain: no Paresthesias: no Glucose Monitoring: yes  Accucheck frequency: rarely  Fasting glucose: 140 range  Post prandial:  Evening:  Before meals: Taking Insulin?: no  Long acting insulin:  Short acting insulin: Blood Pressure Monitoring: not checking Retinal Examination: Not Up To Date -- macular degeneration, [facility] Foot Exam: Up to Date Pneumovax: Not up to Date - refuses vaccines Influenza: Not up to Date - refuses vaccines Aspirin: no   HYPERTENSION / HYPERLIPIDEMIA Currently to be taking Lisinopril 5 MG daily + Crestor 10 MG daily. Satisfied with current treatment? yes Duration of hypertension: chronic BP monitoring frequency: not checking BP range:  BP medication side effects: no Past BP meds:  Duration of hyperlipidemia: chronic Aspirin: no Recent stressors: no Recurrent headaches: no Visual changes: no Palpitations: no Dyspnea: no Chest pain: no Lower extremity edema: no Dizzy/lightheaded: no    KIDNEY STONES HISTORY Saw urology last on 03/29/23.  Is taking Flomax and Ditropan.  History of kidney stones.  Recent ultrasound showed mild hydronephrosis. BPH status: stable Satisfied with current treatment?: yes Medication side effects: no Medication compliance: good compliance Duration: months Nocturia: 1/night Urinary frequency:no Incomplete voiding: no Urgency: no Weak urinary stream: no Straining to start stream: no Dysuria: no IPSS Questionnaire (AUA-7): Over the past month.   1)  How often have you had a sensation of not emptying your bladder completely after you finish urinating?  0 - Not at all  2)  How often have you had to urinate again less than two hours after you finished urinating? 1 - Less than 1 time in 5  3)  How often have you found you stopped and started again several times when you urinated?  0 - Not at all  4) How difficult have you found it to postpone urination?  0 - Not at all  5) How often have you had a weak urinary stream?  0 - Not at all  6) How often have you had to push or strain to begin urination?  0 - Not at all  7) How many times did you most typically get up to urinate from the time you went to bed until the time you got up in the morning?  1 - 1 time  Total score:  0-7 mildly symptomatic   8-19 moderately symptomatic   20-35 severely symptomatic  OCD Saw psychiatry last on 02/28/23.  History of ECT treatments (March 2024).  Currently taking Olanzapine and Fluvoxamine + Trazodone. Return visit to psychiatry scheduled for 05/09/23, no changes at recent visit. Mood status: stable Satisfied with current treatment?: no Symptom severity: moderate Duration of current treatment : chronic Side effects: no Medication compliance: good compliance Psychotherapy/counseling: yes in the past Depressed mood: occasional Anxious mood: no Anhedonia:no Significant weight loss or gain: no Insomnia: improved Fatigue: no Feelings of worthlessness or  guilt: no Impaired concentration/indecisiveness: no Suicidal ideations: no Hopelessness: no Crying spells: no    04/14/2023   10:15 AM 02/28/2023   11:27 AM 01/13/2023    9:20 AM 12/14/2022    3:07 PM 11/29/2022    9:55 AM  Depression screen PHQ 2/9  Decreased Interest 0 0 1 0 1  Down, Depressed, Hopeless 0 0 1 1 0  PHQ - 2 Score 0 0 2 1 1   Altered sleeping 0 0 0 0 0  Tired, decreased energy 0 0 1 0 1  Change in appetite 0 0 1 1 2   Feeling bad or failure about yourself  0 0 0 0 0  Trouble concentrating 0 0 0 0 0  Moving slowly or fidgety/restless 0 0 0 0 0  Suicidal thoughts 0 0 0 0 0  PHQ-9 Score 0 0 4 2 4   Difficult doing work/chores Not difficult at all Not difficult at all Not difficult at all Not difficult at all Not difficult at all       04/14/2023   10:15 AM 01/13/2023    9:21 AM 12/14/2022    3:08 PM 11/29/2022    9:55 AM  GAD 7 : Generalized Anxiety Score  Nervous, Anxious, on Edge 0 0 0 0  Control/stop worrying 0 0 0 0  Worry too much - different things 0 0 0 0  Trouble relaxing 0 0 0 1  Restless 0 0 0 0  Easily annoyed or irritable 0 0 0 0  Afraid - awful might happen 0 0 0 0  Total GAD 7 Score 0 0 0 1  Anxiety Difficulty Not difficult at all Not difficult at all Not difficult at all Not difficult at all   Relevant past medical, surgical, family and social history reviewed and updated as indicated. Interim medical history since our last visit reviewed. Allergies and medications reviewed and updated.  Review of Systems  Constitutional:  Negative for activity change, diaphoresis, fatigue and fever.  Respiratory:  Negative for cough, chest tightness, shortness of breath and wheezing.   Cardiovascular:  Negative for chest pain, palpitations and leg swelling.  Gastrointestinal: Negative.   Neurological: Negative.   Psychiatric/Behavioral:  Negative for decreased concentration, self-injury, sleep disturbance and suicidal ideas. The patient is not nervous/anxious.      Per HPI unless specifically indicated above     Objective:    BP 116/80   Pulse 96   Temp (!) 97.5 F (36.4 C) (Oral)   Ht 5\' 9"  (1.753 m)   Wt 203 lb (92.1 kg)   SpO2 96%   BMI 29.98 kg/m   Wt Readings from Last 3 Encounters:  04/14/23 203 lb (92.1 kg)  03/29/23 195 lb (88.5 kg)  03/10/23 196 lb (88.9 kg)    Physical Exam Vitals and nursing note reviewed.  Constitutional:      General: He is awake. He is not in acute distress.    Appearance: He is well-developed and well-groomed. He is obese. He is not  ill-appearing.  HENT:     Head: Normocephalic and atraumatic.     Right Ear: Hearing normal. No drainage.     Left Ear: Hearing normal. No drainage.  Eyes:     General: Lids are normal.        Right eye: No discharge.        Left eye: No discharge.     Conjunctiva/sclera: Conjunctivae normal.     Pupils: Pupils are equal, round, and reactive to light.  Neck:     Thyroid: No thyromegaly.     Vascular: No carotid bruit.     Trachea: Trachea normal.  Cardiovascular:     Rate and Rhythm: Normal rate and regular rhythm.     Heart sounds: Normal heart sounds, S1 normal and S2 normal. No murmur heard.    No gallop.  Pulmonary:     Effort: Pulmonary effort is normal. No accessory muscle usage or respiratory distress.     Breath sounds: Normal breath sounds.  Abdominal:     General: Bowel sounds are normal.     Palpations: Abdomen is soft.  Musculoskeletal:        General: Normal range of motion.     Cervical back: Normal range of motion and neck supple.     Right lower leg: No edema.     Left lower leg: No edema.  Skin:    General: Skin is warm and dry.     Capillary Refill: Capillary refill takes less than 2 seconds.  Neurological:     Mental Status: He is alert and oriented to person, place, and time.     Deep Tendon Reflexes: Reflexes are normal and symmetric.  Psychiatric:        Attention and Perception: Attention normal.        Mood and Affect: Mood  normal.        Speech: Speech normal.        Behavior: Behavior normal. Behavior is cooperative.        Thought Content: Thought content normal.       04/14/2023   10:39 AM 02/28/2023   11:34 AM 02/10/2022   11:05 AM 02/08/2021   11:24 AM  6CIT Screen  What Year? 0 points 0 points 0 points 0 points  What month? 0 points 0 points 0 points 0 points  What time? 0 points 0 points 0 points 0 points  Count back from 20 0 points 0 points 0 points 0 points  Months in reverse 0 points 2 points 2 points 0 points  Repeat phrase 0 points 0 points 4 points 4 points  Total Score 0 points 2 points 6 points 4 points   Results for orders placed or performed in visit on 01/26/23  CULTURE, URINE COMPREHENSIVE   Specimen: Urine   UR  Result Value Ref Range   Urine Culture, Comprehensive Final report (A)    Organism ID, Bacteria Enterococcus faecalis (A)    Organism ID, Bacteria Not applicable    ANTIMICROBIAL SUSCEPTIBILITY Comment   Microscopic Examination   Urine  Result Value Ref Range   WBC, UA >30 (A) 0 - 5 /hpf   RBC, Urine >30 (A) 0 - 2 /hpf   Epithelial Cells (non renal) 0-10 0 - 10 /hpf   Bacteria, UA Many (A) None seen/Few  Urinalysis, Complete  Result Value Ref Range   Specific Gravity, UA 1.020 1.005 - 1.030   pH, UA 5.5 5.0 - 7.5   Color, UA Yellow Yellow  Appearance Ur Cloudy (A) Clear   Leukocytes,UA 1+ (A) Negative   Protein,UA 2+ (A) Negative/Trace   Glucose, UA 3+ (A) Negative   Ketones, UA 3+ (A) Negative   RBC, UA 3+ (A) Negative   Bilirubin, UA Negative Negative   Urobilinogen, Ur 0.2 0.2 - 1.0 mg/dL   Nitrite, UA Negative Negative   Microscopic Examination See below:       Assessment & Plan:   Problem List Items Addressed This Visit       Endocrine   Hyperlipidemia associated with type 2 diabetes mellitus (HCC)    Chronic, ongoing.  Will continue Rosuvastatin 10 MG daily as is tolerating and monitor, adjust as needed.  Lipid panel and CMP today.       Relevant Orders   Bayer DCA Hb A1c Waived   Comprehensive metabolic panel   Lipid Panel w/o Chol/HDL Ratio   Type 2 diabetes mellitus with hyperglycemia, without long-term current use of insulin (HCC) - Primary    Diagnosed December 2022, A1c 9.9% at the time.  A1c today has trended down to 7.2% from 9%, with increase in Jardiance.  Did not tolerate Metformin in past, severe diarrhea. - Continue Jardiance 25 MG and Januvia 100 MG as they are offering benefit.  Could consider change to GLP1 in future, however unsure he will perform weekly injection, could do Rybelsus.  Recommend he check BS at home daily and document for next visit so provider can review.  Focus on diabetic diet.  LABS: CMP.  Urine ALB 150 March 2024.  Bring all pill bottles to next visit. - Refuses vaccines - Foot exam up to date and needs eye exam -- recommend he schedule ASAP - ACE and statin on board      Relevant Orders   Bayer DCA Hb A1c Waived     Genitourinary   Hydronephrosis of right kidney    Continue collaboration with urology as needed, he is to return in 6 months for follow-up with them.  History of kidney stones.  Maintain current medication regimen.        Hematopoietic and Hemostatic   Thrombocytopenia (HCC)    Ongoing.  Noted on recent labs, however had improved on recheck to 209.  Will continue to monitor, check CBC next visit.        Other   OCD (obsessive compulsive disorder)    Refer to depression plan of care.      Severe recurrent major depression without psychotic features (HCC)    Chronic, ongoing.  Denies SI/HI.  Overall mood much stable today and scores improved.  Will continue current medication regimen and collaboration with psychiatry.  Obtain EKG as needed dependent on psychiatry recommendations, annual checks.      Vitamin B12 deficiency    Chronic, ongoing.  Not consistently taking supplement, ran out.  Refills sent in today and educated him on this.      Vitamin D deficiency     Chronic, ongoing.  Not consistently taking supplement, ran out.  Refills sent in today and educated him on this.        Follow up plan: Return in about 3 months (around 07/15/2023) for T2DM, HTN/HLD, MOOD.

## 2023-04-14 NOTE — Assessment & Plan Note (Signed)
Ongoing.  Noted on recent labs, however had improved on recheck to 209.  Will continue to monitor, check CBC next visit.

## 2023-04-14 NOTE — Assessment & Plan Note (Signed)
Chronic, ongoing.  Not consistently taking supplement, ran out.  Refills sent in today and educated him on this. 

## 2023-04-14 NOTE — Assessment & Plan Note (Signed)
Continue collaboration with urology as needed, he is to return in 6 months for follow-up with them.  History of kidney stones.  Maintain current medication regimen.

## 2023-04-15 LAB — LIPID PANEL W/O CHOL/HDL RATIO
Cholesterol, Total: 166 mg/dL (ref 100–199)
HDL: 43 mg/dL (ref >39)
LDL Chol Calc (NIH): 92 mg/dL (ref 0–99)
Triglycerides: 179 mg/dL — ABNORMAL HIGH (ref 0–149)
VLDL Cholesterol Cal: 31 mg/dL (ref 5–40)

## 2023-04-15 LAB — COMPREHENSIVE METABOLIC PANEL
ALT: 29 IU/L (ref 0–44)
AST: 19 IU/L (ref 0–40)
Albumin: 4.6 g/dL (ref 3.9–4.9)
Alkaline Phosphatase: 142 IU/L — ABNORMAL HIGH (ref 44–121)
BUN/Creatinine Ratio: 19 (ref 10–24)
BUN: 20 mg/dL (ref 8–27)
Bilirubin Total: 0.8 mg/dL (ref 0.0–1.2)
CO2: 21 mmol/L (ref 20–29)
Calcium: 10.5 mg/dL — ABNORMAL HIGH (ref 8.6–10.2)
Chloride: 104 mmol/L (ref 96–106)
Creatinine, Ser: 1.04 mg/dL (ref 0.76–1.27)
Globulin, Total: 2 g/dL (ref 1.5–4.5)
Glucose: 147 mg/dL — ABNORMAL HIGH (ref 70–99)
Potassium: 4.2 mmol/L (ref 3.5–5.2)
Sodium: 140 mmol/L (ref 134–144)
Total Protein: 6.6 g/dL (ref 6.0–8.5)
eGFR: 77 mL/min/{1.73_m2} (ref >59)

## 2023-04-15 NOTE — Progress Notes (Signed)
Good morning, please let Rope know his labs have returned.  Calcium remains a little elevated, but is trending down.  We will recheck next visit.  Kidney and liver function are normal.  Cholesterol levels trending down.  We may consider increasing Rosuvastatin to 20 MG next visit to help further get levels to goal and reduce risk of stroke or heart attack.  Any questions? Keep being amazing!!  Thank you for allowing me to participate in your care.  I appreciate you. Kindest regards, Hamid Brookens
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
## 2023-05-09 ENCOUNTER — Ambulatory Visit (INDEPENDENT_AMBULATORY_CARE_PROVIDER_SITE_OTHER): Payer: Medicare HMO | Admitting: Psychiatry

## 2023-05-09 ENCOUNTER — Encounter: Payer: Self-pay | Admitting: Psychiatry

## 2023-05-09 VITALS — BP 144/84 | HR 94 | Temp 98.0°F | Ht 69.0 in | Wt 204.0 lb

## 2023-05-09 DIAGNOSIS — F3342 Major depressive disorder, recurrent, in full remission: Secondary | ICD-10-CM

## 2023-05-09 DIAGNOSIS — G4700 Insomnia, unspecified: Secondary | ICD-10-CM

## 2023-05-09 DIAGNOSIS — F429 Obsessive-compulsive disorder, unspecified: Secondary | ICD-10-CM | POA: Diagnosis not present

## 2023-05-09 NOTE — Telephone Encounter (Signed)
Called [employer] for questions about medication authorization.

## 2023-06-22 ENCOUNTER — Other Ambulatory Visit: Payer: Self-pay | Admitting: Nurse Practitioner

## 2023-06-22 NOTE — Telephone Encounter (Signed)
Medication Refill - Medication: fluvoxaMINE (LUVOX) 100 MG tablet  Pt has 3 pills left.   Has the patient contacted their pharmacy? Yes.   Pharmacy advised pt that they sent a refill request over to the office and never heard anything back from the office.      Preferred Pharmacy (with phone number or street name): [id number] - [place],  - [address] AT [facility] [address] [facility]  Phone: [phone]  Has the patient been seen for an appointment in the last year OR does the patient have an upcoming appointment? Yes.    Agent: Please be advised that RX refills may take up to 3 business days. We ask that you follow-up with your pharmacy.

## 2023-06-23 MED ORDER — FLUVOXAMINE MALEATE 100 MG PO TABS: 100.0000 mg | ORAL_TABLET | ORAL | 0 refills | Status: DC

## 2023-06-23 NOTE — Telephone Encounter (Signed)
Requested Prescriptions  Pending Prescriptions Disp Refills   fluvoxaMINE (LUVOX) 100 MG tablet 180 tablet 0    Sig: Take 1 tablet (100 mg total) by mouth 2 (two) times daily at 8 am and 4 pm.     Psychiatry:  Antidepressants - SSRI Passed - 06/22/2023 12:22 PM      Passed - Completed PHQ-2 or PHQ-9 in the last 360 days      Passed - Valid encounter within last 6 months    Recent Outpatient Visits           2 months ago Type 2 diabetes mellitus with hyperglycemia, without long-term current use of insulin (HCC)   [place] [facility] Echo, [place] T, NP   3 months ago Type 2 diabetes mellitus with hyperglycemia, without long-term current use of insulin (HCC)   [place] [facility] [place], [place] T, NP   5 months ago Type 2 diabetes mellitus with hyperglycemia, without long-term current use of insulin (HCC)   [place] [facility] [place], [place] T, NP   6 months ago Severe recurrent major depression without psychotic features (HCC)   [place] [facility] [place], Jolene T, NP   8 months ago Type 2 diabetes mellitus with proteinuria (HCC)   [place] [facility] [place], Dorie Rank, NP       Future Appointments             In 3 weeks Cannady, Dorie Rank, NP [place] [facility]   In 3 months Stoioff, Verna Czech, MD [facility] [place]

## 2023-07-16 NOTE — Patient Instructions (Incomplete)

## 2023-07-17 ENCOUNTER — Encounter: Payer: Self-pay | Admitting: Nurse Practitioner

## 2023-07-17 ENCOUNTER — Ambulatory Visit (INDEPENDENT_AMBULATORY_CARE_PROVIDER_SITE_OTHER): Payer: Medicare HMO | Admitting: Nurse Practitioner

## 2023-07-17 VITALS — BP 106/73 | HR 156 | Temp 97.5°F | Ht 69.0 in | Wt 212.0 lb

## 2023-07-17 DIAGNOSIS — N401 Enlarged prostate with lower urinary tract symptoms: Secondary | ICD-10-CM

## 2023-07-17 DIAGNOSIS — E1165 Type 2 diabetes mellitus with hyperglycemia: Secondary | ICD-10-CM

## 2023-07-17 DIAGNOSIS — E785 Hyperlipidemia, unspecified: Secondary | ICD-10-CM

## 2023-07-17 DIAGNOSIS — F422 Mixed obsessional thoughts and acts: Secondary | ICD-10-CM

## 2023-07-17 DIAGNOSIS — N138 Other obstructive and reflux uropathy: Secondary | ICD-10-CM

## 2023-07-17 DIAGNOSIS — I4892 Unspecified atrial flutter: Secondary | ICD-10-CM | POA: Diagnosis not present

## 2023-07-17 DIAGNOSIS — F332 Major depressive disorder, recurrent severe without psychotic features: Secondary | ICD-10-CM

## 2023-07-17 DIAGNOSIS — Z7984 Long term (current) use of oral hypoglycemic drugs: Secondary | ICD-10-CM

## 2023-07-17 DIAGNOSIS — D696 Thrombocytopenia, unspecified: Secondary | ICD-10-CM | POA: Diagnosis not present

## 2023-07-17 DIAGNOSIS — E538 Deficiency of other specified B group vitamins: Secondary | ICD-10-CM

## 2023-07-17 DIAGNOSIS — E1169 Type 2 diabetes mellitus with other specified complication: Secondary | ICD-10-CM

## 2023-07-17 DIAGNOSIS — I483 Typical atrial flutter: Secondary | ICD-10-CM | POA: Insufficient documentation

## 2023-07-17 DIAGNOSIS — E559 Vitamin D deficiency, unspecified: Secondary | ICD-10-CM

## 2023-07-17 MED ORDER — VITAMIN D (ERGOCALCIFEROL) 1.25 MG (50000 UNIT) PO CAPS: 50000.0000 [IU] | ORAL_CAPSULE | ORAL | 4 refills | Status: DC

## 2023-07-17 MED ORDER — APIXABAN 5 MG PO TABS: 5.0000 mg | ORAL_TABLET | Freq: Two times a day (BID) | ORAL | 2 refills | Status: DC

## 2023-07-17 MED ORDER — VITAMIN B-12 1000 MCG PO TABS: 1000.0000 ug | ORAL_TABLET | Freq: Every day | ORAL | 4 refills | Status: DC

## 2023-07-17 MED ORDER — METOPROLOL SUCCINATE ER 50 MG PO TB24: 50.0000 mg | ORAL_TABLET | Freq: Every day | ORAL | 1 refills | Status: DC

## 2023-07-17 NOTE — Assessment & Plan Note (Signed)
Noted tachycardia on exam today, patient reports occasional flutters.  EKG concerning for atrial flutter.  Will start Metoprolol XL 50 MG daily and Eliquis 5 MG BID.  Urgent referral to cardiology placed + ordered Echo and Zio to further assess.  Educated patient on findings + treatment plan.  All questions answered.  He reports understanding of diagnosis and plan of care.  Advised him if any CP or SOB present to immediately go to ER for assessment.

## 2023-07-17 NOTE — Assessment & Plan Note (Signed)
Chronic, ongoing.  Will continue Rosuvastatin 10 MG daily as is tolerating and monitor, adjust as needed.  Lipid panel and CMP today.

## 2023-07-17 NOTE — Progress Notes (Addendum)
BP 106/73   Pulse (!) 156   Temp (!) 97.5 F (36.4 C) (Oral)   Ht 5\' 9"  (1.753 m)   Wt 212 lb (96.2 kg)   SpO2 97%   BMI 31.31 kg/m    Subjective:    Patient ID: Dillon [name], male    DOB: Feb 13, 1951, 72 y.o.   MRN: [id number]  HPI: Dillon [name] is a 72 y.o. male  Chief Complaint  Patient presents with   Diabetes    Patient declines having a recent Diabetic Eye Exam.    Hyperlipidemia   Hypertension   Mood   DIABETES A1c June was 7.2%.  Initially diagnosed in December 2022, A1c was 9.9% at the time. Taking Jardiance 25 MG daily and Januvia 100 MG daily.  Metformin in past caused issues with frequent diarrhea.  He reports eating lots of [place] stew recently.  Continues to eat fast food, eats chicken often.  Is to be taking Vitamin B12 and D, but has not been taking. Hypoglycemic episodes:no Polydipsia/polyuria: none Visual disturbance: no Chest pain: no Paresthesias: no Glucose Monitoring: yes  Accucheck frequency: rarely  Fasting glucose: unsure the numbers  Post prandial:  Evening:  Before meals: Taking Insulin?: no  Long acting insulin:  Short acting insulin: Blood Pressure Monitoring: not checking Retinal Examination: Not Up To Date -- macular degeneration, [facility] Foot Exam: Up to Date Pneumovax: Not up to Date - refuses vaccines Influenza: Not up to Date - refuses vaccines Aspirin: no   HYPERTENSION / HYPERLIPIDEMIA Currently taking Lisinopril 5 MG daily + Crestor 10 MG daily.  History of low platelets on labs. Satisfied with current treatment? yes Duration of hypertension: chronic BP monitoring frequency: not checking BP range:  BP medication side effects: no Past BP meds:  Duration of hyperlipidemia: chronic Aspirin: no Recent stressors: no Recurrent headaches: no Visual changes: no Palpitations: occasional flutter Dyspnea: no Chest pain: no Lower extremity edema: no Dizzy/lightheaded: no   BPH Continues on Flomax  daily, history of kidney stones. BPH status: stable Satisfied with current treatment?: yes Medication side effects: no Medication compliance: good compliance Duration: chronic Nocturia: 1-2x per night Urinary frequency:no Incomplete voiding: no Urgency: no Weak urinary stream: no Straining to start stream: no Dysuria: no Onset: gradual Severity: mild  OCD Saw psychiatry last on 05/09/23.  History of ECT treatments (March 2024).  Currently taking Olanzapine and Fluvoxamine + Trazodone.  Mood status: stable Satisfied with current treatment?: no Symptom severity: moderate Duration of current treatment : chronic Side effects: no Medication compliance: good compliance Psychotherapy/counseling: yes in the past Depressed mood: no Anxious mood: sometimes, but not as bad Anhedonia:no Significant weight loss or gain: no Insomnia: improved Fatigue: no Feelings of worthlessness or guilt: no Impaired concentration/indecisiveness: no Suicidal ideations: no Hopelessness: no Crying spells: no    07/17/2023    9:46 AM 04/14/2023   10:15 AM 02/28/2023   11:27 AM 01/13/2023    9:20 AM 12/14/2022    3:07 PM  Depression screen PHQ 2/9  Decreased Interest 1 0 0 1 0  Down, Depressed, Hopeless 0 0 0 1 1  PHQ - 2 Score 1 0 0 2 1  Altered sleeping 0 0 0 0 0  Tired, decreased energy 0 0 0 1 0  Change in appetite 0 0 0 1 1  Feeling bad or failure about yourself  0 0 0 0 0  Trouble concentrating 0 0 0 0 0  Moving slowly or fidgety/restless 0 0 0  0 0  Suicidal thoughts 0 0 0 0 0  PHQ-9 Score 1 0 0 4 2  Difficult doing work/chores Not difficult at all Not difficult at all Not difficult at all Not difficult at all Not difficult at all       07/17/2023    9:46 AM 04/14/2023   10:15 AM 01/13/2023    9:21 AM 12/14/2022    3:08 PM  GAD 7 : Generalized Anxiety Score  Nervous, Anxious, on Edge 0 0 0 0  Control/stop worrying 0 0 0 0  Worry too much - different things 0 0 0 0  Trouble relaxing 0 0 0  0  Restless 0 0 0 0  Easily annoyed or irritable 0 0 0 0  Afraid - awful might happen 0 0 0 0  Total GAD 7 Score 0 0 0 0  Anxiety Difficulty Not difficult at all Not difficult at all Not difficult at all Not difficult at all   Relevant past medical, surgical, family and social history reviewed and updated as indicated. Interim medical history since our last visit reviewed. Allergies and medications reviewed and updated.  Review of Systems  Constitutional:  Negative for activity change, diaphoresis, fatigue and fever.  Respiratory:  Negative for cough, chest tightness, shortness of breath and wheezing.   Cardiovascular:  Negative for chest pain, palpitations and leg swelling.  Gastrointestinal: Negative.   Neurological: Negative.   Psychiatric/Behavioral:  Negative for decreased concentration, self-injury, sleep disturbance and suicidal ideas. The patient is not nervous/anxious.     Per HPI unless specifically indicated above     Objective:    BP 106/73   Pulse (!) 156   Temp (!) 97.5 F (36.4 C) (Oral)   Ht 5\' 9"  (1.753 m)   Wt 212 lb (96.2 kg)   SpO2 97%   BMI 31.31 kg/m   Wt Readings from Last 3 Encounters:  07/17/23 212 lb (96.2 kg)  05/09/23 204 lb (92.5 kg)  04/14/23 203 lb (92.1 kg)    Physical Exam Vitals and nursing note reviewed.  Constitutional:      General: He is awake. He is not in acute distress.    Appearance: He is well-developed and well-groomed. He is obese. He is not ill-appearing.  HENT:     Head: Normocephalic and atraumatic.     Right Ear: Hearing normal. No drainage.     Left Ear: Hearing normal. No drainage.  Eyes:     General: Lids are normal.        Right eye: No discharge.        Left eye: No discharge.     Conjunctiva/sclera: Conjunctivae normal.     Pupils: Pupils are equal, round, and reactive to light.  Neck:     Thyroid: No thyromegaly.     Vascular: No carotid bruit.     Trachea: Trachea normal.  Cardiovascular:     Rate and  Rhythm: Regular rhythm. Tachycardia present.     Heart sounds: Normal heart sounds, S1 normal and S2 normal. No murmur heard.    No gallop.     Comments: HR 160 on auscultation, he denies any red flag symptoms, but does report occasional fast feeling. Pulmonary:     Effort: Pulmonary effort is normal. No accessory muscle usage or respiratory distress.     Breath sounds: Normal breath sounds.  Abdominal:     General: Bowel sounds are normal.     Palpations: Abdomen is soft.  Musculoskeletal:  General: Normal range of motion.     Cervical back: Normal range of motion and neck supple.     Right lower leg: No edema.     Left lower leg: No edema.  Skin:    General: Skin is warm and dry.     Capillary Refill: Capillary refill takes less than 2 seconds.  Neurological:     Mental Status: He is alert and oriented to person, place, and time.     Deep Tendon Reflexes: Reflexes are normal and symmetric.  Psychiatric:        Attention and Perception: Attention normal.        Mood and Affect: Mood normal.        Speech: Speech normal.        Behavior: Behavior normal. Behavior is cooperative.        Thought Content: Thought content normal.   EKG My review and personal interpretation at Time: 1100  Indication: tachycardia Rate: 160  Rhythm: atrial flutter Axis: normal Other: No nonspecific st abn, no stemi, no lvh     04/14/2023   10:39 AM 02/28/2023   11:34 AM 02/10/2022   11:05 AM 02/08/2021   11:24 AM  6CIT Screen  What Year? 0 points 0 points 0 points 0 points  What month? 0 points 0 points 0 points 0 points  What time? 0 points 0 points 0 points 0 points  Count back from 20 0 points 0 points 0 points 0 points  Months in reverse 0 points 2 points 2 points 0 points  Repeat phrase 0 points 0 points 4 points 4 points  Total Score 0 points 2 points 6 points 4 points   Results for orders placed or performed in visit on 04/14/23  Bayer DCA Hb A1c Waived  Result Value Ref Range    HB A1C (BAYER DCA - WAIVED) 7.2 (H) 4.8 - 5.6 %  Comprehensive metabolic panel  Result Value Ref Range   Glucose 147 (H) 70 - 99 mg/dL   BUN 20 8 - 27 mg/dL   Creatinine, Ser 1.61 0.76 - 1.27 mg/dL   eGFR 77 >09 UE/AVW/0.98   BUN/Creatinine Ratio 19 10 - 24   Sodium 140 134 - 144 mmol/L   Potassium 4.2 3.5 - 5.2 mmol/L   Chloride 104 96 - 106 mmol/L   CO2 21 20 - 29 mmol/L   Calcium 10.5 (H) 8.6 - 10.2 mg/dL   Total Protein 6.6 6.0 - 8.5 g/dL   Albumin 4.6 3.9 - 4.9 g/dL   Globulin, Total 2.0 1.5 - 4.5 g/dL   Bilirubin Total 0.8 0.0 - 1.2 mg/dL   Alkaline Phosphatase 142 (H) 44 - 121 IU/L   AST 19 0 - 40 IU/L   ALT 29 0 - 44 IU/L  Lipid Panel w/o Chol/HDL Ratio  Result Value Ref Range   Cholesterol, Total 166 100 - 199 mg/dL   Triglycerides 119 (H) 0 - 149 mg/dL   HDL 43 >14 mg/dL   VLDL Cholesterol Cal 31 5 - 40 mg/dL   LDL Chol Calc (NIH) 92 0 - 99 mg/dL      Assessment & Plan:   Problem List Items Addressed This Visit       Cardiovascular and Mediastinum   Atrial flutter (HCC)    Noted tachycardia on exam today, patient reports occasional flutters.  EKG concerning for atrial flutter.  Will start Metoprolol XL 50 MG daily and Eliquis 5 MG BID.  Urgent referral to cardiology  placed + ordered Echo and Zio to further assess.  Educated patient on findings + treatment plan.  All questions answered.  He reports understanding of diagnosis and plan of care.  Advised him if any CP or SOB present to immediately go to ER for assessment.      Relevant Medications   metoprolol succinate (TOPROL-XL) 50 MG 24 hr tablet   apixaban (ELIQUIS) 5 MG TABS tablet   Other Relevant Orders   LONG TERM MONITOR (3-14 DAYS)   EKG 12-Lead (Completed)   Ambulatory referral to Cardiology   ECHOCARDIOGRAM COMPLETE     Endocrine   Hyperlipidemia associated with type 2 diabetes mellitus (HCC)    Chronic, ongoing.  Will continue Rosuvastatin 10 MG daily as is tolerating and monitor, adjust as  needed.  Lipid panel and CMP today.      Relevant Medications   metoprolol succinate (TOPROL-XL) 50 MG 24 hr tablet   apixaban (ELIQUIS) 5 MG TABS tablet   Other Relevant Orders   HgB A1c   Comprehensive metabolic panel   Lipid Panel w/o Chol/HDL Ratio   Type 2 diabetes mellitus with hyperglycemia, without long-term current use of insulin (HCC) - Primary    Diagnosed December 2022, A1c 9.9% at the time.  A1c last visit was 7.2% down from 9%.  Did not tolerate Metformin in past, severe diarrhea. Recheck today, concerned it may be a little more elevated based on his occasional BS readings. - Continue Jardiance 25 MG and Januvia 100 MG as they are offering benefit.  Could consider change to GLP1 in future, however unsure he will perform weekly injection, could do Rybelsus.  Recommend he check BS at home daily and document for next visit so provider can review.  Focus on diabetic diet.  LABS: CMP, A1c.  Urine ALB 150 March 2024.  Bring all pill bottles to next visit. - Refuses vaccines - Foot exam up to date and needs eye exam -- recommend he schedule ASAP - ACE and statin on board      Relevant Orders   HgB A1c   Comprehensive metabolic panel     Genitourinary   Benign localized hyperplasia of prostate with urinary obstruction    Chronic, ongoing.  Controlled with Flomax.  Continue this regimen and collaboration with urology. PSA on labs today.      Relevant Orders   PSA     Hematopoietic and Hemostatic   Thrombocytopenia (HCC)    Ongoing.  Noted on labs, however had improved on recheck to 209.  Will continue to monitor, check CBC today.      Relevant Orders   CBC with Differential/Platelet     Other   OCD (obsessive compulsive disorder)    Refer to depression plan of care.      Severe recurrent major depression without psychotic features (HCC)    Chronic, ongoing.  Denies SI/HI.  Overall mood stable per patient.  Will continue current medication regimen and collaboration  with psychiatry.  Obtain EKG as needed dependent on psychiatry recommendations, annual checks.      Vitamin B12 deficiency    Chronic, ongoing.  Not consistently taking supplement, ran out.  Refills sent in today and educated him on this.      Relevant Orders   Vitamin B12   Vitamin D deficiency    Chronic, ongoing.  Not consistently taking supplement, ran out.  Refills sent in today and educated him on this.      Relevant Orders   VITAMIN  D 25 Hydroxy (Vit-D Deficiency, Fractures)     Follow up plan: Return in about 4 weeks (around 08/14/2023) for ATRIAL FLUTTER.

## 2023-07-17 NOTE — Assessment & Plan Note (Signed)
Diagnosed December 2022, A1c 9.9% at the time.  A1c last visit was 7.2% down from 9%.  Did not tolerate Metformin in past, severe diarrhea. Recheck today, concerned it may be a little more elevated based on his occasional BS readings. - Continue Jardiance 25 MG and Januvia 100 MG as they are offering benefit.  Could consider change to GLP1 in future, however unsure he will perform weekly injection, could do Rybelsus.  Recommend he check BS at home daily and document for next visit so provider can review.  Focus on diabetic diet.  LABS: CMP, A1c.  Urine ALB 150 March 2024.  Bring all pill bottles to next visit. - Refuses vaccines - Foot exam up to date and needs eye exam -- recommend he schedule ASAP - ACE and statin on board

## 2023-07-17 NOTE — Assessment & Plan Note (Signed)
Chronic, ongoing.  Not consistently taking supplement, ran out.  Refills sent in today and educated him on this. 

## 2023-07-17 NOTE — Assessment & Plan Note (Signed)
Refer to depression plan of care. 

## 2023-07-17 NOTE — Assessment & Plan Note (Signed)
Ongoing.  Noted on labs, however had improved on recheck to 209.  Will continue to monitor, check CBC today.

## 2023-07-17 NOTE — Assessment & Plan Note (Signed)
Chronic, ongoing.  Denies SI/HI.  Overall mood stable per patient.  Will continue current medication regimen and collaboration with psychiatry.  Obtain EKG as needed dependent on psychiatry recommendations, annual checks.

## 2023-07-17 NOTE — Assessment & Plan Note (Addendum)
Chronic, ongoing.  Controlled with Flomax.  Continue this regimen and collaboration with urology. PSA on labs today.

## 2023-07-18 ENCOUNTER — Ambulatory Visit: Payer: Medicare HMO | Attending: Nurse Practitioner

## 2023-07-18 DIAGNOSIS — R000 Tachycardia, unspecified: Secondary | ICD-10-CM

## 2023-07-18 LAB — COMPREHENSIVE METABOLIC PANEL
ALT: 39 [IU]/L (ref 0–44)
AST: 26 [IU]/L (ref 0–40)
Albumin: 4.5 g/dL (ref 3.9–4.9)
Alkaline Phosphatase: 138 [IU]/L — ABNORMAL HIGH (ref 44–121)
BUN/Creatinine Ratio: 21 (ref 10–24)
BUN: 25 mg/dL (ref 8–27)
Bilirubin Total: 0.9 mg/dL (ref 0.0–1.2)
CO2: 18 mmol/L — ABNORMAL LOW (ref 20–29)
Calcium: 9.9 mg/dL (ref 8.6–10.2)
Chloride: 105 mmol/L (ref 96–106)
Creatinine, Ser: 1.17 mg/dL (ref 0.76–1.27)
Globulin, Total: 1.9 g/dL (ref 1.5–4.5)
Glucose: 173 mg/dL — ABNORMAL HIGH (ref 70–99)
Potassium: 4.1 mmol/L (ref 3.5–5.2)
Sodium: 141 mmol/L (ref 134–144)
Total Protein: 6.4 g/dL (ref 6.0–8.5)
eGFR: 67 mL/min/{1.73_m2} (ref >59)

## 2023-07-18 LAB — CBC WITH DIFFERENTIAL/PLATELET
Basophils Absolute: 0 10*3/uL (ref 0.0–0.2)
Basos: 0 %
EOS (ABSOLUTE): 0 10*3/uL (ref 0.0–0.4)
Eos: 0 %
Hematocrit: 57.5 % — ABNORMAL HIGH (ref 37.5–51.0)
Hemoglobin: 18.7 g/dL — ABNORMAL HIGH (ref 13.0–17.7)
Immature Grans (Abs): 0 10*3/uL (ref 0.0–0.1)
Immature Granulocytes: 0 %
Lymphocytes Absolute: 2.9 10*3/uL (ref 0.7–3.1)
Lymphs: 29 %
MCH: 30.9 pg (ref 26.6–33.0)
MCHC: 32.5 g/dL (ref 31.5–35.7)
MCV: 95 fL (ref 79–97)
Monocytes Absolute: 0.8 10*3/uL (ref 0.1–0.9)
Monocytes: 8 %
Neutrophils Absolute: 6.1 10*3/uL (ref 1.4–7.0)
Neutrophils: 63 %
Platelets: 141 10*3/uL — ABNORMAL LOW (ref 150–450)
RBC: 6.06 x10E6/uL — ABNORMAL HIGH (ref 4.14–5.80)
RDW: 12.8 % (ref 11.6–15.4)
WBC: 9.9 10*3/uL (ref 3.4–10.8)

## 2023-07-18 LAB — PSA: Prostate Specific Ag, Serum: 3.6 ng/mL (ref 0.0–4.0)

## 2023-07-18 LAB — LIPID PANEL W/O CHOL/HDL RATIO
Cholesterol, Total: 126 mg/dL (ref 100–199)
HDL: 41 mg/dL (ref >39)
LDL Chol Calc (NIH): 62 mg/dL (ref 0–99)
Triglycerides: 132 mg/dL (ref 0–149)
VLDL Cholesterol Cal: 23 mg/dL (ref 5–40)

## 2023-07-18 LAB — HEMOGLOBIN A1C
Est. average glucose Bld gHb Est-mCnc: 192 mg/dL
Hgb A1c MFr Bld: 8.3 % — ABNORMAL HIGH (ref 4.8–5.6)

## 2023-07-18 LAB — VITAMIN D 25 HYDROXY (VIT D DEFICIENCY, FRACTURES): Vit D, 25-Hydroxy: 30.9 ng/mL (ref 30.0–100.0)

## 2023-07-18 LAB — VITAMIN B12: Vitamin B-12: 342 pg/mL (ref 232–1245)

## 2023-07-18 NOTE — Progress Notes (Signed)
Good afternoon, please let Dillon [name] know his labs have returned: - A1c is creeping up to 8.3% from 7.2%, I do recommend we try a weekly injectable like Ozempic or Trulicity and if concerns we can show you how to inject here in the office.  These are not insulin as we discussed, but a different family of medications that can help lower sugars and even reduce weight.  Let me know if you want to try one.  If we change I will want to see you back in 4 weeks + we would be able to stop Januvia. - Kidney and liver function stable. - CBC shows mild elevation in hemoglobin and hematocrit.  Platelets are a little low.  We will continue to monitor these. - Remainder of labs are stable.  Any questions? Keep being amazing!!  Thank you for allowing me to participate in your care.  I appreciate you. Kindest regards, Shaquon Gropp

## 2023-07-19 DIAGNOSIS — R000 Tachycardia, unspecified: Secondary | ICD-10-CM | POA: Diagnosis not present
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
## 2023-08-01 ENCOUNTER — Ambulatory Visit (INDEPENDENT_AMBULATORY_CARE_PROVIDER_SITE_OTHER): Payer: Medicare HMO | Admitting: Psychiatry

## 2023-08-01 ENCOUNTER — Encounter: Payer: Self-pay | Admitting: Psychiatry

## 2023-08-01 VITALS — BP 127/93 | HR 142 | Temp 97.7°F | Ht 69.0 in | Wt 214.6 lb

## 2023-08-01 DIAGNOSIS — F429 Obsessive-compulsive disorder, unspecified: Secondary | ICD-10-CM | POA: Diagnosis not present

## 2023-08-01 DIAGNOSIS — F3342 Major depressive disorder, recurrent, in full remission: Secondary | ICD-10-CM | POA: Diagnosis not present

## 2023-08-01 DIAGNOSIS — G4700 Insomnia, unspecified: Secondary | ICD-10-CM | POA: Diagnosis not present

## 2023-08-01 MED ORDER — OLANZAPINE 7.5 MG PO TABS: 7.5000 mg | ORAL_TABLET | Freq: Every day | ORAL | 0 refills | Status: DC

## 2023-08-01 NOTE — Patient Instructions (Signed)
Continue Fluvoxamine 100 mg twice a day Decrease olanzapine 7.5 mg at night  Continue trazodone 50 mg at night as needed for insomnia Next appointment: 12/16 at 4:30

## 2023-08-07 DIAGNOSIS — I483 Typical atrial flutter: Secondary | ICD-10-CM | POA: Diagnosis not present

## 2023-08-07 DIAGNOSIS — E782 Mixed hyperlipidemia: Secondary | ICD-10-CM | POA: Diagnosis not present

## 2023-08-07 DIAGNOSIS — R000 Tachycardia, unspecified: Secondary | ICD-10-CM | POA: Diagnosis not present

## 2023-08-07 DIAGNOSIS — I10 Essential (primary) hypertension: Secondary | ICD-10-CM | POA: Diagnosis not present

## 2023-08-08 DIAGNOSIS — R000 Tachycardia, unspecified: Secondary | ICD-10-CM | POA: Diagnosis not present

## 2023-08-12 NOTE — Patient Instructions (Signed)

## 2023-08-14 ENCOUNTER — Ambulatory Visit (INDEPENDENT_AMBULATORY_CARE_PROVIDER_SITE_OTHER): Payer: Medicare HMO | Admitting: Nurse Practitioner

## 2023-08-14 ENCOUNTER — Emergency Department: Payer: Medicare HMO

## 2023-08-14 ENCOUNTER — Other Ambulatory Visit: Payer: Self-pay

## 2023-08-14 ENCOUNTER — Encounter: Payer: Self-pay | Admitting: Nurse Practitioner

## 2023-08-14 ENCOUNTER — Inpatient Hospital Stay
Admission: EM | Admit: 2023-08-14 | Discharge: 2023-08-16 | DRG: 308 | Disposition: A | Discharge status: Home / Self Care | Payer: Medicare HMO | Attending: Internal Medicine | Admitting: Internal Medicine

## 2023-08-14 VITALS — BP 125/91 | HR 141 | Temp 97.4°F | Ht 69.0 in | Wt 215.8 lb

## 2023-08-14 DIAGNOSIS — Z87442 Personal history of urinary calculi: Secondary | ICD-10-CM

## 2023-08-14 DIAGNOSIS — E785 Hyperlipidemia, unspecified: Secondary | ICD-10-CM | POA: Diagnosis present

## 2023-08-14 DIAGNOSIS — I484 Atypical atrial flutter: Secondary | ICD-10-CM | POA: Diagnosis not present

## 2023-08-14 DIAGNOSIS — E669 Obesity, unspecified: Secondary | ICD-10-CM | POA: Diagnosis present

## 2023-08-14 DIAGNOSIS — I10 Essential (primary) hypertension: Secondary | ICD-10-CM | POA: Diagnosis not present

## 2023-08-14 DIAGNOSIS — N401 Enlarged prostate with lower urinary tract symptoms: Secondary | ICD-10-CM | POA: Diagnosis present

## 2023-08-14 DIAGNOSIS — E876 Hypokalemia: Secondary | ICD-10-CM | POA: Diagnosis present

## 2023-08-14 DIAGNOSIS — Z7984 Long term (current) use of oral hypoglycemic drugs: Secondary | ICD-10-CM

## 2023-08-14 DIAGNOSIS — E66811 Obesity, class 1: Secondary | ICD-10-CM | POA: Diagnosis present

## 2023-08-14 DIAGNOSIS — I5021 Acute systolic (congestive) heart failure: Secondary | ICD-10-CM | POA: Diagnosis not present

## 2023-08-14 DIAGNOSIS — D696 Thrombocytopenia, unspecified: Secondary | ICD-10-CM | POA: Diagnosis present

## 2023-08-14 DIAGNOSIS — Z8661 Personal history of infections of the central nervous system: Secondary | ICD-10-CM | POA: Diagnosis not present

## 2023-08-14 DIAGNOSIS — H3530 Unspecified macular degeneration: Secondary | ICD-10-CM | POA: Diagnosis present

## 2023-08-14 DIAGNOSIS — E1165 Type 2 diabetes mellitus with hyperglycemia: Secondary | ICD-10-CM | POA: Diagnosis present

## 2023-08-14 DIAGNOSIS — E1169 Type 2 diabetes mellitus with other specified complication: Secondary | ICD-10-CM | POA: Diagnosis present

## 2023-08-14 DIAGNOSIS — I483 Typical atrial flutter: Secondary | ICD-10-CM | POA: Diagnosis not present

## 2023-08-14 DIAGNOSIS — Z6832 Body mass index (BMI) 32.0-32.9, adult: Secondary | ICD-10-CM | POA: Diagnosis not present

## 2023-08-14 DIAGNOSIS — Q644 Malformation of urachus: Secondary | ICD-10-CM

## 2023-08-14 DIAGNOSIS — Z825 Family history of asthma and other chronic lower respiratory diseases: Secondary | ICD-10-CM | POA: Diagnosis not present

## 2023-08-14 DIAGNOSIS — I4892 Unspecified atrial flutter: Secondary | ICD-10-CM | POA: Diagnosis not present

## 2023-08-14 DIAGNOSIS — N1330 Unspecified hydronephrosis: Secondary | ICD-10-CM | POA: Diagnosis present

## 2023-08-14 DIAGNOSIS — R000 Tachycardia, unspecified: Secondary | ICD-10-CM | POA: Diagnosis present

## 2023-08-14 DIAGNOSIS — R002 Palpitations: Secondary | ICD-10-CM | POA: Diagnosis present

## 2023-08-14 DIAGNOSIS — Z7901 Long term (current) use of anticoagulants: Secondary | ICD-10-CM

## 2023-08-14 DIAGNOSIS — F429 Obsessive-compulsive disorder, unspecified: Secondary | ICD-10-CM | POA: Diagnosis present

## 2023-08-14 DIAGNOSIS — Z79899 Other long term (current) drug therapy: Secondary | ICD-10-CM | POA: Diagnosis not present

## 2023-08-14 DIAGNOSIS — R0601 Orthopnea: Secondary | ICD-10-CM | POA: Diagnosis not present

## 2023-08-14 DIAGNOSIS — N3289 Other specified disorders of bladder: Secondary | ICD-10-CM | POA: Diagnosis present

## 2023-08-14 DIAGNOSIS — Z885 Allergy status to narcotic agent status: Secondary | ICD-10-CM | POA: Diagnosis not present

## 2023-08-14 DIAGNOSIS — I4891 Unspecified atrial fibrillation: Principal | ICD-10-CM | POA: Diagnosis present

## 2023-08-14 DIAGNOSIS — F332 Major depressive disorder, recurrent severe without psychotic features: Secondary | ICD-10-CM | POA: Diagnosis present

## 2023-08-14 DIAGNOSIS — I110 Hypertensive heart disease with heart failure: Secondary | ICD-10-CM | POA: Diagnosis present

## 2023-08-14 DIAGNOSIS — I5023 Acute on chronic systolic (congestive) heart failure: Secondary | ICD-10-CM | POA: Diagnosis present

## 2023-08-14 DIAGNOSIS — I428 Other cardiomyopathies: Secondary | ICD-10-CM | POA: Diagnosis not present

## 2023-08-14 LAB — T4, FREE: Free T4: 1.05 ng/dL (ref 0.61–1.12)

## 2023-08-14 LAB — TSH: TSH: 1.722 u[IU]/mL (ref 0.350–4.500)

## 2023-08-14 LAB — CBC
HCT: 52 % (ref 39.0–52.0)
Hemoglobin: 17.5 g/dL — ABNORMAL HIGH (ref 13.0–17.0)
MCH: 30.5 pg (ref 26.0–34.0)
MCHC: 33.7 g/dL (ref 30.0–36.0)
MCV: 90.6 fL (ref 80.0–100.0)
Platelets: 129 10*3/uL — ABNORMAL LOW (ref 150–400)
RBC: 5.74 MIL/uL (ref 4.22–5.81)
RDW: 13.2 % (ref 11.5–15.5)
WBC: 9.7 10*3/uL (ref 4.0–10.5)
nRBC: 0 % (ref 0.0–0.2)

## 2023-08-14 LAB — HEPARIN LEVEL (UNFRACTIONATED): Heparin Unfractionated: >1.1 [IU]/mL — ABNORMAL HIGH (ref 0.30–0.70)

## 2023-08-14 LAB — BASIC METABOLIC PANEL
Anion gap: 11 (ref 5–15)
BUN: 23 mg/dL (ref 8–23)
CO2: 19 mmol/L — ABNORMAL LOW (ref 22–32)
Calcium: 9.6 mg/dL (ref 8.9–10.3)
Chloride: 112 mmol/L — ABNORMAL HIGH (ref 98–111)
Creatinine, Ser: 1.04 mg/dL (ref 0.61–1.24)
GFR, Estimated: >60 mL/min (ref >60)
Glucose, Bld: 145 mg/dL — ABNORMAL HIGH (ref 70–99)
Potassium: 4.5 mmol/L (ref 3.5–5.1)
Sodium: 142 mmol/L (ref 135–145)

## 2023-08-14 LAB — GLUCOSE, CAPILLARY
Glucose-Capillary: 184 mg/dL — ABNORMAL HIGH (ref 70–99)
Glucose-Capillary: 138 mg/dL — ABNORMAL HIGH (ref 70–99)

## 2023-08-14 LAB — PROTIME-INR
INR: 1.5 — ABNORMAL HIGH (ref 0.8–1.2)
Prothrombin Time: 18.5 s — ABNORMAL HIGH (ref 11.4–15.2)

## 2023-08-14 LAB — TROPONIN I (HIGH SENSITIVITY)
Troponin I (High Sensitivity): 8 ng/L (ref <18)
Troponin I (High Sensitivity): 9 ng/L (ref <18)

## 2023-08-14 LAB — MAGNESIUM: Magnesium: 2.4 mg/dL (ref 1.7–2.4)

## 2023-08-14 LAB — APTT: aPTT: 38 s — ABNORMAL HIGH (ref 24–36)

## 2023-08-14 MED ORDER — HEPARIN BOLUS VIA INFUSION
4000.0000 [IU] | Freq: Once | INTRAVENOUS | Status: AC
  Administered 2023-08-14: 4000 [IU] via INTRAVENOUS
  Filled 2023-08-14: qty 4000

## 2023-08-14 MED ORDER — ROSUVASTATIN CALCIUM 10 MG PO TABS
10.0000 mg | ORAL_TABLET | Freq: Every day | ORAL | Status: DC
  Administered 2023-08-14 – 2023-08-15 (×2): 10 mg via ORAL
  Filled 2023-08-14 (×3): qty 1

## 2023-08-14 MED ORDER — ENOXAPARIN SODIUM 60 MG/0.6ML IJ SOSY: 0.5000 mg/kg | PREFILLED_SYRINGE | INTRAMUSCULAR | Status: DC

## 2023-08-14 MED ORDER — HEPARIN (PORCINE) 25000 UT/250ML-% IV SOLN
1250.0000 [IU]/h | INTRAVENOUS | Status: DC
  Administered 2023-08-14: 1250 [IU]/h via INTRAVENOUS
  Filled 2023-08-14: qty 250

## 2023-08-14 MED ORDER — ENOXAPARIN SODIUM 40 MG/0.4ML IJ SOSY: 40.0000 mg | PREFILLED_SYRINGE | INTRAMUSCULAR | Status: DC

## 2023-08-14 MED ORDER — INSULIN ASPART 100 UNIT/ML IJ SOLN
0.0000 [IU] | Freq: Three times a day (TID) | INTRAMUSCULAR | Status: DC
  Administered 2023-08-14 – 2023-08-15 (×4): 1 [IU] via SUBCUTANEOUS
  Filled 2023-08-14 (×4): qty 1

## 2023-08-14 MED ORDER — ONDANSETRON HCL 4 MG PO TABS: 4.0000 mg | ORAL_TABLET | Freq: Four times a day (QID) | ORAL | Status: DC | PRN

## 2023-08-14 MED ORDER — METOPROLOL TARTRATE 5 MG/5ML IV SOLN: 5.0000 mg | INTRAVENOUS | Status: DC | PRN

## 2023-08-14 MED ORDER — METOPROLOL TARTRATE 5 MG/5ML IV SOLN
5.0000 mg | INTRAVENOUS | Status: AC | PRN
  Administered 2023-08-14 (×3): 5 mg via INTRAVENOUS
  Filled 2023-08-14: qty 5

## 2023-08-14 MED ORDER — METOPROLOL SUCCINATE ER 100 MG PO TB24
100.0000 mg | ORAL_TABLET | Freq: Every day | ORAL | Status: DC
  Administered 2023-08-14 – 2023-08-16 (×3): 100 mg via ORAL
  Filled 2023-08-14 (×3): qty 1

## 2023-08-14 MED ORDER — APIXABAN 5 MG PO TABS
5.0000 mg | ORAL_TABLET | Freq: Two times a day (BID) | ORAL | Status: DC
  Administered 2023-08-14 – 2023-08-16 (×4): 5 mg via ORAL
  Filled 2023-08-14 (×4): qty 1

## 2023-08-14 MED ORDER — AMIODARONE HCL IN DEXTROSE 360-4.14 MG/200ML-% IV SOLN
30.0000 mg/h | INTRAVENOUS | Status: DC
  Administered 2023-08-15 – 2023-08-16 (×3): 30 mg/h via INTRAVENOUS
  Filled 2023-08-14 (×3): qty 200

## 2023-08-14 MED ORDER — AMIODARONE HCL IN DEXTROSE 360-4.14 MG/200ML-% IV SOLN
60.0000 mg/h | INTRAVENOUS | Status: AC
  Administered 2023-08-14 (×2): 60 mg/h via INTRAVENOUS
  Filled 2023-08-14 (×3): qty 200

## 2023-08-14 MED ORDER — ONDANSETRON HCL 4 MG/2ML IJ SOLN: 4.0000 mg | Freq: Four times a day (QID) | INTRAMUSCULAR | Status: DC | PRN

## 2023-08-14 NOTE — Assessment & Plan Note (Addendum)
Persistent heart rate into the 140s with noted EKG concerning for atrial flutter with RVR. Recently seen by outpatient [facility] cardiology on 1021 for similar symptoms Started on amiodarone drip as well as as needed metoprolol in the ER Will continue for now pending cardiology evaluation Started on eliquis outpt  Transition to heparin gtt pending cardiology evaluation  Troponin pending TEE cardioversion being considered Monitor

## 2023-08-14 NOTE — Assessment & Plan Note (Signed)
Diagnosed on 07/17/23.  EKG concerning for atrial flutter.  Having some orthopnea, SOB with activity, and palpitations -- mild edema on exam and some SOB with talking occasionally noted.  Continue Metoprolol XL 100 MG daily and Eliquis 5 MG BID.  Continue to collaborate with cardiology, spoke with them via secure chat this morning -- plan is for ablation in future.  Alerted them to current plan.  PCP has recommended patient go to ER today as HR still tachycardic with current new symptoms of possible HF.   Concern for worsening and in need of rate control.  Educated patient on findings + treatment plan.  All questions answered.  He reports understanding of diagnosis and plan of care.  His grand daughter is taking him to ER.

## 2023-08-14 NOTE — ED Provider Notes (Addendum)
[facility] Provider Note    Event Date/Time   First MD Initiated Contact with Patient 08/14/23 1123     (approximate)   History   Tachycardia   HPI  Dillon [name] is a 72 y.o. male   Past medical history of diabetes, hypertension hyperlipidemia, recently diagnosed a flutter/A-fib earlier this month started on Eliquis and metoprolol with tachycardia.  He states that occasionally he gets lightheaded and short of breath.  He feels short of breath when laying flat.  He denies chest pain.  He has been compliant with all of his medications except that he only forgot to take his metoprolol this morning.  He has had multiple doctors appointments including cardiology appointment where he is persistently tachycardic and has increasing doses of his metoprolol without rate control.  He went to a doctor's appointment today found to be persistently tachycardic in the 140s and normotensive and sent to the emergency department for further evaluation treatment.   External Medical Documents Reviewed: Cardiology note from earlier this month documenting history of atrial flutter, medications including Eliquis and metoprolol, recently increased dose.      Physical Exam   Triage Vital Signs: ED Triage Vitals  Encounter Vitals Group     BP 08/14/23 1118 (!) 133/113     Systolic BP Percentile --      Diastolic BP Percentile --      Pulse Rate 08/14/23 1118 (!) 141     Resp 08/14/23 1118 18     Temp --      Temp src --      SpO2 08/14/23 1118 94 %     Weight 08/14/23 1119 218 lb (98.9 kg)     Height 08/14/23 1119 5\' 9"  (1.753 m)     Head Circumference --      Peak Flow --      Pain Score 08/14/23 1119 0     Pain Loc --      Pain Education --      Exclude from Growth Chart --     Most recent vital signs: Vitals:   08/14/23 1210 08/14/23 1215  BP: (!) 130/110 (!) 128/103  Pulse: (!) 140 (!) 140  Resp: (!) 27 20  Temp:    SpO2: 95% 95%     General: Awake, no distress.  CV:  Good peripheral perfusion.  Resp:  Normal effort.  Abd:  No distention.  Other:  Awake alert comfortable normotensive tachycardic irregular rate in the 140s.  No fever, breathing comfortably with clear lungs.  He does not appear floridly overloaded with fluid, as his lungs are clear, he does have some very mild edema at the ankles.   ED Results / Procedures / Treatments   Labs (all labs ordered are listed, but only abnormal results are displayed) Labs Reviewed  BASIC METABOLIC PANEL - Abnormal; Notable for the following components:      Result Value   Chloride 112 (*)    CO2 19 (*)    Glucose, Bld 145 (*)    All other components within normal limits  CBC - Abnormal; Notable for the following components:   Hemoglobin 17.5 (*)    Platelets 129 (*)    All other components within normal limits  MAGNESIUM  T4, FREE  TSH     I ordered and reviewed the above labs they are notable for cell count significant for hemoglobin 17.5 which is consistent with prior testing, electrolytes largely unremarkable, normal renal function  EKG  ED ECG REPORT I, Pilar Jarvis, the attending physician, personally viewed and interpreted this ECG.   Date: 08/14/2023  EKG Time: 1126  Rate: 142  Rhythm: Aflutter/fib  Axis: nl  Intervals:none  ST&T Change: no stemi    RADIOLOGY I independently reviewed and interpreted chest x-ray and I see no obvious focalities or pneumothorax I also reviewed radiologist's formal read.   PROCEDURES:  Critical Care performed: Yes, see critical care procedure note(s)  .Critical Care  Performed by: Pilar Jarvis, MD Authorized by: Pilar Jarvis, MD   Critical care provider statement:    Critical care time (minutes):  30   Critical care was time spent personally by me on the following activities:  Development of treatment plan with patient or surrogate, discussions with consultants, evaluation of patient's response to  treatment, examination of patient, ordering and review of laboratory studies, ordering and review of radiographic studies, ordering and performing treatments and interventions, pulse oximetry, re-evaluation of patient's condition and review of old charts    MEDICATIONS ORDERED IN ED: Medications  amiodarone (NEXTERONE PREMIX) 360-4.14 MG/200ML-% (1.8 mg/mL) IV infusion (has no administration in time range)  amiodarone (NEXTERONE PREMIX) 360-4.14 MG/200ML-% (1.8 mg/mL) IV infusion (has no administration in time range)  metoprolol tartrate (LOPRESSOR) injection 5 mg (5 mg Intravenous Given 08/14/23 1203)     IMPRESSION / MDM / ASSESSMENT AND PLAN / ED COURSE  I reviewed the triage vital signs and the nursing notes.                                Patient's presentation is most consistent with acute presentation with potential threat to life or bodily function.  Differential diagnosis includes, but is not limited to, dysrhythmia, atrial flutter with RVR, electrolyte derangements, thyroid dysfunction, ACS     The patient is on the cardiac monitor to evaluate for evidence of arrhythmia and/or significant heart rate changes.  MDM:    Atrial flutter/fib with RVR normotensive patient who has been on blood thinners for the past 2 weeks and increasing metoprolol dosing as an outpatient with persistent tachycardia documented on multiple past doctors visits.  He continues to have tachycardia and has some symptoms of lightheadedness and occasional exertional dyspnea.  No history of CHF.  Denies chest pain so I doubt ACS, has been compliant with Eliquis doubt PE.    Will trial IV doses of his metoprolol today for rate control.  If refractory can do diltiazem infusion and admission.   --   Metoprolol IV 5 mg x 3, remains tachycardic, normotensive.  Discussed cardioversion, patient hesitant, for this procedure. Discussed further medication management and will hold on diltiazem given concern  for heart failure given his orthopnea and exertional dyspnea and no prior echocardiogram obtained.  Discussed with [facility] cardiologist on-call Dr. [name] Sing who agrees for admission and amiodarone infusion, will need echo, and consideration of TEE cardioversion while inpatient.     FINAL CLINICAL IMPRESSION(S) / ED DIAGNOSES   Final diagnoses:  Atrial fibrillation with RVR (HCC)     Rx / DC Orders   ED Discharge Orders     None        Note:  This document was prepared using Dragon voice recognition software and may include unintentional dictation errors.    Pilar Jarvis, MD 08/14/23 1203    Pilar Jarvis, MD 08/14/23 [phone]

## 2023-08-14 NOTE — Assessment & Plan Note (Signed)
Blood sugar in 140s  Sensitive SSi  Follow

## 2023-08-14 NOTE — Assessment & Plan Note (Signed)
Continue statin. 

## 2023-08-14 NOTE — ED Triage Notes (Signed)
Pt in via POV from Dr. Isidore Moos. Pt states his HR was in the 140's during visit and MD sent him to the ED. Pt denies CP. Pt denies any heart hx.

## 2023-08-14 NOTE — Consult Note (Signed)
PHARMACY - ANTICOAGULATION CONSULT NOTE  Pharmacy Consult for heparin infusion Indication: atrial fibrillation  Allergies  Allergen Reactions   Codeine Other (See Comments)    Unsure of reaction    Patient Measurements: Height: 5\' 9"  (175.3 cm) Weight: 98.9 kg (218 lb) IBW/kg (Calculated) : 70.7 Heparin Dosing Weight: 91.5 kg  Vital Signs: Temp: 98 F (36.7 C) (10/28 1142) Temp Source: Oral (10/28 1142) BP: 137/116 (10/28 1327) Pulse Rate: 139 (10/28 1327)  Labs: Recent Labs    08/14/23 1136  HGB 17.5*  HCT 52.0  PLT 129*  CREATININE 1.04    Estimated Creatinine Clearance: 76.7 mL/min (by C-G formula based on SCr of 1.04 mg/dL).   Medical History: Past Medical History:  Diagnosis Date   Anomalies of urachus, congenital    BPH with urinary obstruction    Depression    Diabetes mellitus without complication (HCC)    History of kidney stones    Hydronephrosis    Hyperlipidemia    Hypernatremia    Hypokalemia    Kidney stone    Macular degeneration    Meningitis    spinal   OCD (obsessive compulsive disorder)    Protein-calorie malnutrition, severe (HCC)    Shingles    Thrombocytopenia (HCC)     Medications:  Patient on Eliquis 5 mg po BID PTA, last dose reported 10/27 @ 2000.  Assessment: 72 yo male presented to the ED from PCP office due to Aflutter.  Patient was diagnosed with Aflutter recently and started on Eliquis and metoprolol succinate.  Patient started on Amiodarone infusion in the ED.  Pharmacy consulted to start heparin infusion.  Baseline Labs: PT/INR, aPTT, HL = pending    Goal of Therapy:  Heparin level 0.3-0.7 units/ml aPTT 66-102 seconds Monitor platelets by anticoagulation protocol: Yes   Plan:  Give heparin 4000 units IV x 1 Start heparin infusion at 1250 units/hr Check aPTT 6 hours after initiation Adjust based on aPTT until correlation with HL Daily CBC and HL while on infusion  Barrie Folk, PharmD 08/14/2023,1:51  PM

## 2023-08-14 NOTE — Progress Notes (Signed)
PHARMACIST - PHYSICIAN COMMUNICATION  CONCERNING:  Enoxaparin (Lovenox) for DVT Prophylaxis    RECOMMENDATION: Patient was prescribed enoxaprin 40mg  q24 hours for VTE prophylaxis.   Filed Weights   08/14/23 1119  Weight: 98.9 kg (218 lb)    Body mass index is 32.19 kg/m.  Estimated Creatinine Clearance: 76.7 mL/min (by C-G formula based on SCr of 1.04 mg/dL).   Based on [facility] policy patient is candidate for enoxaparin 0.5mg /kg TBW SQ every 24 hours based on BMI being >30.   DESCRIPTION: Pharmacy has adjusted enoxaparin dose per [facility] policy.  Patient is now receiving enoxaparin 0.5 mg/kg every 24 hours    Lowella Bandy, PharmD Clinical Pharmacist  08/14/2023 1:36 PM

## 2023-08-14 NOTE — H&P (Addendum)
History and Physical    Patient: Dillon [name] WGN:[id number] DOB: [birth year] DOA: 08/14/2023 DOS: the patient was seen and examined on 08/14/2023 PCP: Marjie Skiff, NP  Patient coming from:  PCP Clinic   Chief Complaint:  Chief Complaint  Patient presents with   Tachycardia   HPI: Dillon [name] is a 72 y.o. male with medical history significant of depression, OCD, hypertension, hyperlipidemia, type 2 diabetes, obesity, recent diagnosis of atrial flutter presenting with atrial flutter with RVR.  History from patient as well as family.  Per report, and follow-up with his primary care doctor within the past month or so.  Was noted to have a heart rate into the 140s.  Was redirected to the cardiologist for further evaluation.  Had formal diagnosis of atrial flutter on October 21 evaluation in the [facility] system.  Was due for outpatient echocardiogram.  Was also started eliquis and metoprolol.  Per the patient, has been compliant with regimen.  Has had some intermittent shortness of breath.  Minimal to mild orthopnea.  Has not had echocardiogram done.  No reported illicit drug use.  Denies any excessive alcohol use.  Had follow-up with his PCP today.  Heart rate was normal in the 140s.  Was redirected to the ER for further evaluation. Presented to the ER afebrile, heart rate into the 140s, BP stable.  Satting well on room air.  White count 9.7, hemoglobin 17.5, platelets 129, creatinine 1.04, glucose 145.  EKG Atrial flutter with heart rate into the 140s.  Per Dr. Renaldo Reel, case discussed with on-call [facility] cardiology.  Recommend amiodarone drip.  Will formally consult. Review of Systems: As mentioned in the history of present illness. All other systems reviewed and are negative. Past Medical History:  Diagnosis Date   Anomalies of urachus, congenital    BPH with urinary obstruction    Depression    Diabetes mellitus without complication (HCC)    History of kidney stones     Hydronephrosis    Hyperlipidemia    Hypernatremia    Hypokalemia    Kidney stone    Macular degeneration    Meningitis    spinal   OCD (obsessive compulsive disorder)    Protein-calorie malnutrition, severe (HCC)    Shingles    Thrombocytopenia (HCC)    Past Surgical History:  Procedure Laterality Date   CYSTOSCOPY W/ URETERAL STENT PLACEMENT Right 10/12/2022   Procedure: CYSTOSCOPY WITH RETROGRADE PYELOGRAM/URETERAL STENT PLACEMENT;  Surgeon: Riki Altes, MD;  Location: [facility] ORS;  Service: Urology;  Laterality: Right;   CYSTOSCOPY/URETEROSCOPY/HOLMIUM LASER/STENT PLACEMENT Right 12/13/2022   Procedure: CYSTOSCOPY/URETEROSCOPY/HOLMIUM LASER/STENT EXCHANGE;  Surgeon: Riki Altes, MD;  Location: [facility] ORS;  Service: Urology;  Laterality: Right;   CYSTOSCOPY/URETEROSCOPY/HOLMIUM LASER/STENT PLACEMENT Right 12/27/2022   Procedure: CYSTOSCOPY/URETEROSCOPY/HOLMIUM LASER/STENT EXCHANGE;  Surgeon: Riki Altes, MD;  Location: [facility] ORS;  Service: Urology;  Laterality: Right;   KIDNEY STONE SURGERY     Social History:  reports that he has never smoked. He has never used smokeless tobacco. He reports that he does not drink alcohol and does not use drugs.  Allergies  Allergen Reactions   Codeine Other (See Comments)    Unsure of reaction    Family History  Problem Relation Age of Onset   Cancer Mother        bladder   Emphysema Father    Emphysema Sister    COPD Sister    Cancer Brother        prostate  Prior to Admission medications   Medication Sig Start Date End Date Taking? Authorizing Provider  apixaban (ELIQUIS) 5 MG TABS tablet Take 1 tablet (5 mg total) by mouth 2 (two) times daily. 07/17/23  Yes Cannady, Jolene T, NP  cyanocobalamin (VITAMIN B12) 1000 MCG tablet Take 1 tablet (1,000 mcg total) by mouth daily. 07/17/23  Yes Cannady, Jolene T, NP  empagliflozin (JARDIANCE) 25 MG TABS tablet Take 1 tablet (25 mg total) by mouth daily before breakfast. 01/13/23  Yes  Cannady, Jolene T, NP  fluvoxaMINE (LUVOX) 100 MG tablet Take 1 tablet (100 mg total) by mouth 2 (two) times daily at 8 am and 4 pm. 06/23/23  Yes Cannady, Jolene T, NP  glucose blood (ACCU-CHEK GUIDE) test strip Use to check blood sugars 2-3 times daily with goals = <130 fasting in morning and <180 two hours after eating. Bring blood sugar log to appointments. 01/03/22  Yes Cannady, Jolene T, NP  lisinopril (ZESTRIL) 5 MG tablet Take 1 tablet (5 mg total) by mouth daily. 03/24/23  Yes Cannady, Jolene T, NP  metoprolol succinate (TOPROL-XL) 100 MG 24 hr tablet Take 100 mg by mouth daily. Take with or immediately following a meal.   Yes [provider]  Multiple Vitamins-Minerals (ICAPS AREDS 2 PO) Take 1 tablet by mouth 2 (two) times daily.   Yes [provider]  OLANZapine (ZYPREXA) 7.5 MG tablet Take 1 tablet (7.5 mg total) by mouth at bedtime. 08/01/23 10/30/23 Yes Hisada, Barbee Cough, MD  rosuvastatin (CRESTOR) 10 MG tablet Take 1 tablet (10 mg total) by mouth at bedtime. 03/10/23  Yes Cannady, Jolene T, NP  sitaGLIPtin (JANUVIA) 100 MG tablet Take 1 tablet (100 mg total) by mouth daily. 12/14/22  Yes Cannady, Corrie Dandy T, NP  tamsulosin (FLOMAX) 0.4 MG CAPS capsule Take 1 capsule (0.4 mg total) by mouth daily after breakfast. 03/23/23  Yes Stoioff, Verna Czech, MD  Vitamin D, Ergocalciferol, (DRISDOL) 1.25 MG (50000 UNIT) CAPS capsule Take 1 capsule (50,000 Units total) by mouth every 7 (seven) days. Patient taking differently: Take 50,000 Units by mouth every Sunday. 07/17/23  Yes Cannady, Corrie Dandy T, NP  oxybutynin (DITROPAN) 5 MG tablet 1 tab tid prn frequency,urgency, bladder spasm Patient not taking: Reported on 08/14/2023 12/27/22   Riki Altes, MD  traZODone (DESYREL) 50 MG tablet Take 1 tablet (50 mg total) by mouth at bedtime. Patient not taking: Reported on 08/14/2023 12/14/22   Marjie Skiff, NP    Physical Exam: Vitals:   08/14/23 1245 08/14/23 1300 08/14/23 1315 08/14/23 1327   BP: (!) 126/102 (!) 136/109 (!) 133/115 (!) 137/116  Pulse: (!) 141 (!) 139 (!) 139 (!) 139  Resp: (!) 25 16 (!) 28 19  Temp:      TempSrc:      SpO2: 94% 95% 94% 95%  Weight:      Height:       Physical Exam Constitutional:      Appearance: He is obese.  HENT:     Head: Normocephalic and atraumatic.     Nose: Nose normal.     Mouth/Throat:     Mouth: Mucous membranes are moist.  Eyes:     Pupils: Pupils are equal, round, and reactive to light.  Cardiovascular:     Rate and Rhythm: Tachycardia present. Rhythm irregular.  Pulmonary:     Effort: Pulmonary effort is normal.  Abdominal:     General: Bowel sounds are normal.  Musculoskeletal:  General: Normal range of motion.  Skin:    General: Skin is warm.  Neurological:     General: No focal deficit present.  Psychiatric:        Mood and Affect: Mood normal.     Data Reviewed:  There are no new results to review at this time.  DG Chest Port 1 View CLINICAL DATA:  5107 Atrial fibrillation (HCC) 5107.  EXAM: PORTABLE CHEST 1 VIEW  COMPARISON:  01/13/2016.  FINDINGS: There is probable left retrocardiac atelectasis/scarring. Bilateral lung fields are otherwise clear. No acute consolidation or lung collapse. Bilateral costophrenic angles are clear.  Normal cardio-mediastinal silhouette.  No acute osseous abnormalities.  The soft tissues are within normal limits.  IMPRESSION: No acute cardiopulmonary abnormality.  Electronically Signed   By: Jules Schick M.D.   On: 08/14/2023 12:46  Lab Results  Component Value Date   WBC 9.7 08/14/2023   HGB 17.5 (H) 08/14/2023   HCT 52.0 08/14/2023   MCV 90.6 08/14/2023   PLT 129 (L) 08/14/2023   Last metabolic panel Lab Results  Component Value Date   GLUCOSE 145 (H) 08/14/2023   NA 142 08/14/2023   K 4.5 08/14/2023   CL 112 (H) 08/14/2023   CO2 19 (L) 08/14/2023   BUN 23 08/14/2023   CREATININE 1.04 08/14/2023   GFRNONAA >60 08/14/2023    CALCIUM 9.6 08/14/2023   PHOS 3.1 10/18/2022   PROT 6.4 07/17/2023   ALBUMIN 4.5 07/17/2023   LABGLOB 1.9 07/17/2023   AGRATIO 2.0 01/13/2023   BILITOT 0.9 07/17/2023   ALKPHOS 138 (H) 07/17/2023   AST 26 07/17/2023   ALT 39 07/17/2023   ANIONGAP 11 08/14/2023    Assessment and Plan: * Atrial flutter with rapid ventricular response (HCC) Persistent heart rate into the 140s with noted EKG concerning for atrial flutter with RVR. Recently seen by outpatient [facility] cardiology on 1021 for similar symptoms Started on amiodarone drip as well as as needed metoprolol in the ER Will continue for now pending cardiology evaluation Started on eliquis outpt  Transition to heparin gtt pending cardiology evaluation  Troponin pending TEE cardioversion being considered Monitor  Severe recurrent major depression without psychotic features (HCC) Appears stable  Cont home regimen for now Consider holding  luvox as SE profile includes palpitations    Hyperlipidemia associated with type 2 diabetes mellitus (HCC) Continue statin  Type 2 diabetes mellitus with hyperglycemia, without long-term current use of insulin (HCC) Blood sugar in 140s  Sensitive SSi  Follow        Advance Care Planning:   Code Status: Full Code   Consults: Cardiology  Family Communication: Family at the bedside   Severity of Illness: The appropriate patient status for this patient is INPATIENT. Inpatient status is judged to be reasonable and necessary in order to provide the required intensity of service to ensure the patient's safety. The patient's presenting symptoms, physical exam findings, and initial radiographic and laboratory data in the context of their chronic comorbidities is felt to place them at high risk for further clinical deterioration. Furthermore, it is not anticipated that the patient will be medically stable for discharge from the hospital within 2 midnights of admission.   * I certify that at the  point of admission it is my clinical judgment that the patient will require inpatient hospital care spanning beyond 2 midnights from the point of admission due to high intensity of service, high risk for further deterioration and high frequency of surveillance required.*  Author: Floydene Flock, MD 08/14/2023 1:43 PM  For on call review [email or web address].

## 2023-08-14 NOTE — Progress Notes (Addendum)
BP (!) 125/91 (BP Location: Left Arm, Patient Position: Sitting, Cuff Size: Large)   Pulse (!) 141   Temp (!) 97.4 F (36.3 C) (Oral)   Ht 5\' 9"  (1.753 m)   Wt 215 lb 12.8 oz (97.9 kg)   SpO2 98%   BMI 31.87 kg/m    Subjective:    Patient ID: Dillon [name], male    DOB: 10/23/1950, 72 y.o.   MRN: [id number]  HPI: Dillon [name] is a 72 y.o. male  Chief Complaint  Patient presents with   Medical Management of Chronic Issues    4 week follow up on atrial flutter, has a cardiologist appt on Friday, would like to discuss heart monitor machine and recent fatigue, would like to know if it relates to taking eliquis    ATRIAL FIBRILLATION Diagnosed on 07/17/23.  Was started on Eliquis and Metoprolol.  Saw cardiology on 08/07/23 and they increased Metoprolol XL 100 MG. Psychiatry reduced his Olanzapine to help with rate. Atrial fibrillation status: uncontrolled Satisfied with current treatment: yes  Medication side effects:  yes -- fatigue Medication compliance: good compliance Etiology of atrial fibrillation: unknown Palpitations:   occasional, feels it is beating faster Chest pain:  no Dyspnea on exertion:  yes  Orthopnea:   occasional -- occasionally recently, reports he is having to sleep up in a chair.  Often lies in bed for sleep at baseline. Syncope:  no Edema:  no Ventricular rate control: B-blocker Anti-coagulation: long acting   Relevant past medical, surgical, family and social history reviewed and updated as indicated. Interim medical history since our last visit reviewed. Allergies and medications reviewed and updated.  Review of Systems  Constitutional:  Positive for fatigue. Negative for activity change, appetite change, diaphoresis and fever.  Respiratory:  Positive for shortness of breath. Negative for cough, chest tightness and wheezing.   Cardiovascular:  Positive for palpitations and leg swelling. Negative for chest pain.       Orthopnea   Gastrointestinal:  Positive for abdominal distention (mild). Negative for abdominal pain, constipation, diarrhea, nausea and vomiting.  Neurological: Negative.   Psychiatric/Behavioral: Negative.     Per HPI unless specifically indicated above     Objective:    BP (!) 125/91 (BP Location: Left Arm, Patient Position: Sitting, Cuff Size: Large)   Pulse (!) 141   Temp (!) 97.4 F (36.3 C) (Oral)   Ht 5\' 9"  (1.753 m)   Wt 215 lb 12.8 oz (97.9 kg)   SpO2 98%   BMI 31.87 kg/m   Wt Readings from Last 3 Encounters:  08/14/23 215 lb 12.8 oz (97.9 kg)  07/17/23 212 lb (96.2 kg)  04/14/23 203 lb (92.1 kg)    Physical Exam Vitals and nursing note reviewed.  Constitutional:      General: He is awake. He is not in acute distress.    Appearance: He is well-developed and well-groomed. He is obese. He is not ill-appearing.  HENT:     Head: Normocephalic and atraumatic.     Right Ear: Hearing normal. No drainage.     Left Ear: Hearing normal. No drainage.  Eyes:     General: Lids are normal.        Right eye: No discharge.        Left eye: No discharge.     Conjunctiva/sclera: Conjunctivae normal.     Pupils: Pupils are equal, round, and reactive to light.  Neck:     Thyroid: No thyromegaly.  Vascular: No carotid bruit.     Trachea: Trachea normal.  Cardiovascular:     Rate and Rhythm: Tachycardia present. Rhythm irregularly irregular.     Heart sounds: Normal heart sounds, S1 normal and S2 normal. No murmur heard.    No gallop.     Comments: HR 140 range on auscultation. Pulmonary:     Effort: Pulmonary effort is normal. No accessory muscle usage or respiratory distress.     Breath sounds: Normal breath sounds.     Comments: Occasional mild SOB with talking noted. Abdominal:     General: Bowel sounds are normal. There is distension (mild noted).     Palpations: Abdomen is soft.  Musculoskeletal:        General: Normal range of motion.     Cervical back: Normal range of  motion and neck supple.     Right lower leg: 1+ Pitting Edema present.     Left lower leg: 1+ Pitting Edema present.  Skin:    General: Skin is warm and dry.     Capillary Refill: Capillary refill takes less than 2 seconds.  Neurological:     Mental Status: He is alert and oriented to person, place, and time.     Deep Tendon Reflexes: Reflexes are normal and symmetric.  Psychiatric:        Attention and Perception: Attention normal.        Mood and Affect: Mood normal.        Speech: Speech normal.        Behavior: Behavior normal. Behavior is cooperative.        Thought Content: Thought content normal.    Results for orders placed or performed in visit on 07/17/23  HgB A1c  Result Value Ref Range   Hgb A1c MFr Bld 8.3 (H) 4.8 - 5.6 %   Est. average glucose Bld gHb Est-mCnc 192 mg/dL  Comprehensive metabolic panel  Result Value Ref Range   Glucose 173 (H) 70 - 99 mg/dL   BUN 25 8 - 27 mg/dL   Creatinine, Ser 1.61 0.76 - 1.27 mg/dL   eGFR 67 >09 UE/AVW/0.98   BUN/Creatinine Ratio 21 10 - 24   Sodium 141 134 - 144 mmol/L   Potassium 4.1 3.5 - 5.2 mmol/L   Chloride 105 96 - 106 mmol/L   CO2 18 (L) 20 - 29 mmol/L   Calcium 9.9 8.6 - 10.2 mg/dL   Total Protein 6.4 6.0 - 8.5 g/dL   Albumin 4.5 3.9 - 4.9 g/dL   Globulin, Total 1.9 1.5 - 4.5 g/dL   Bilirubin Total 0.9 0.0 - 1.2 mg/dL   Alkaline Phosphatase 138 (H) 44 - 121 IU/L   AST 26 0 - 40 IU/L   ALT 39 0 - 44 IU/L  Lipid Panel w/o Chol/HDL Ratio  Result Value Ref Range   Cholesterol, Total 126 100 - 199 mg/dL   Triglycerides 119 0 - 149 mg/dL   HDL 41 >14 mg/dL   VLDL Cholesterol Cal 23 5 - 40 mg/dL   LDL Chol Calc (NIH) 62 0 - 99 mg/dL  VITAMIN D 25 Hydroxy (Vit-D Deficiency, Fractures)  Result Value Ref Range   Vit D, 25-Hydroxy 30.9 30.0 - 100.0 ng/mL  Vitamin B12  Result Value Ref Range   Vitamin B-12 342 232 - 1,245 pg/mL  PSA  Result Value Ref Range   Prostate Specific Ag, Serum 3.6 0.0 - 4.0 ng/mL  CBC  with Differential/Platelet  Result Value Ref Range  WBC 9.9 3.4 - 10.8 x10E3/uL   RBC 6.06 (H) 4.14 - 5.80 x10E6/uL   Hemoglobin 18.7 (H) 13.0 - 17.7 g/dL   Hematocrit 16.1 (H) 09.6 - 51.0 %   MCV 95 79 - 97 fL   MCH 30.9 26.6 - 33.0 pg   MCHC 32.5 31.5 - 35.7 g/dL   RDW 04.5 40.9 - 81.1 %   Platelets 141 (L) 150 - 450 x10E3/uL   Neutrophils 63 Not Estab. %   Lymphs 29 Not Estab. %   Monocytes 8 Not Estab. %   Eos 0 Not Estab. %   Basos 0 Not Estab. %   Neutrophils Absolute 6.1 1.4 - 7.0 x10E3/uL   Lymphocytes Absolute 2.9 0.7 - 3.1 x10E3/uL   Monocytes Absolute 0.8 0.1 - 0.9 x10E3/uL   EOS (ABSOLUTE) 0.0 0.0 - 0.4 x10E3/uL   Basophils Absolute 0.0 0.0 - 0.2 x10E3/uL   Immature Granulocytes 0 Not Estab. %   Immature Grans (Abs) 0.0 0.0 - 0.1 x10E3/uL      Assessment & Plan:   Problem List Items Addressed This Visit       Cardiovascular and Mediastinum   Atrial flutter (HCC) - Primary    Diagnosed on 07/17/23.  EKG concerning for atrial flutter.  Having some orthopnea, SOB with activity, and palpitations -- mild edema on exam and some SOB with talking occasionally noted.  Continue Metoprolol XL 100 MG daily and Eliquis 5 MG BID.  Continue to collaborate with cardiology, spoke with them via secure chat this morning -- plan is for ablation in future.  Alerted them to current plan.  PCP has recommended patient go to ER today as HR still tachycardic with current new symptoms of possible HF.   Concern for worsening and in need of rate control.  Educated patient on findings + treatment plan.  All questions answered.  He reports understanding of diagnosis and plan of care.  His grand daughter is taking him to ER.      Relevant Medications   metoprolol succinate (TOPROL-XL) 100 MG 24 hr tablet    Time: 25 minutes, >50% spent counseling/or care coordination  Follow up plan: Return for After Hospital Check.

## 2023-08-14 NOTE — Consult Note (Signed)
[facility] CLINIC CARDIOLOGY CONSULT NOTE       Patient ID: Dillon [name] MRN: [id number] DOB/AGE: 12/13/1950 72 y.o.  Admit date: 08/14/2023 Referring Physician Dr. Pilar Jarvis Primary Physician Marjie Skiff, NP  Primary Cardiologist Dr. Melton Alar Reason for Consultation atrial flutter RVR  HPI: Dillon [name] is a 72 y.o. male  with a past medical history of hypertension, hyperlipidemia, and diabetes who presented to the ED on 08/14/2023 for tachycardia. EKG in the ED notable for atrial flutter with rate in the 140s. Cardiology was consulted for further evaluation.   Patient reports that about 1 month ago he saw his PCP for regularly scheduled appointment and was found to be tachycardic.  EKG on 9/30 revealed atrial flutter with elevated rate.  He was started on metoprolol and Eliquis and referred to cardiology.  He was seen in our office on 10/21 for this and remained in atrial flutter with a rate in the 140s.  His dose of metoprolol was increased at this time and he was set up for EP evaluation.  He followed up with his PCP this morning and rate was found to be persistently elevated in the 140s.  He was sent to the the ED for further evaluation.  Lab workup today notable for Cr 1.04, K 4.5, Mag 2.4, Hgb 17.5. Initial troponin 8. EKG with atrial flutter rate 142 bpm. CXR without acute process. HR sustaining in the 140s despite IV metoprolol, thus patient was started on amiodarone infusion in the ED.   At the time of my evaluation this afternoon the patient is resting comfortably in ED stretcher with daughter and son present at bedside. He reports occasional episodes of palpitations over the last month. States he does not always feel like his heart is racing but HR has been elevated when he checks this at home. States that for the last few days he has also noticed some SOB with he is laying down at night. Denies any chest pain. States he has felt lightheaded at times but denies any  dizziness or syncope. Reports he has noticed some mild swelling in his ankles where he can see an impression from his socks. Denies any exertional symptoms.   Review of systems complete and found to be negative unless listed above    Past Medical History:  Diagnosis Date   Anomalies of urachus, congenital    BPH with urinary obstruction    Depression    Diabetes mellitus without complication (HCC)    History of kidney stones    Hydronephrosis    Hyperlipidemia    Hypernatremia    Hypokalemia    Kidney stone    Macular degeneration    Meningitis    spinal   OCD (obsessive compulsive disorder)    Protein-calorie malnutrition, severe (HCC)    Shingles    Thrombocytopenia (HCC)     Past Surgical History:  Procedure Laterality Date   CYSTOSCOPY W/ URETERAL STENT PLACEMENT Right 10/12/2022   Procedure: CYSTOSCOPY WITH RETROGRADE PYELOGRAM/URETERAL STENT PLACEMENT;  Surgeon: Riki Altes, MD;  Location: [facility] ORS;  Service: Urology;  Laterality: Right;   CYSTOSCOPY/URETEROSCOPY/HOLMIUM LASER/STENT PLACEMENT Right 12/13/2022   Procedure: CYSTOSCOPY/URETEROSCOPY/HOLMIUM LASER/STENT EXCHANGE;  Surgeon: Riki Altes, MD;  Location: [facility] ORS;  Service: Urology;  Laterality: Right;   CYSTOSCOPY/URETEROSCOPY/HOLMIUM LASER/STENT PLACEMENT Right 12/27/2022   Procedure: CYSTOSCOPY/URETEROSCOPY/HOLMIUM LASER/STENT EXCHANGE;  Surgeon: Riki Altes, MD;  Location: [facility] ORS;  Service: Urology;  Laterality: Right;   KIDNEY STONE SURGERY      (  Not in a hospital admission)  Social History   Socioeconomic History   Marital status: Widowed    Spouse name: Not on file   Number of children: Not on file   Years of education: Not on file   Highest education level: GED or equivalent  Occupational History   Occupation: retired  Tobacco Use   Smoking status: Never   Smokeless tobacco: Never  Vaping Use   Vaping status: Never Used  Substance and Sexual Activity   Alcohol use: No     Alcohol/week: 0.0 standard drinks of alcohol   Drug use: No   Sexual activity: Not Currently  Other Topics Concern   Not on file  Social History Narrative   Live alone and  have two children that are very supportive.   Social Determinants of Health   Financial Resource Strain: Low Risk  (02/28/2023)   Overall Financial Resource Strain (CARDIA)    Difficulty of Paying Living Expenses: Not hard at all  Food Insecurity: No Food Insecurity (08/14/2023)   Hunger Vital Sign    Worried About Running Out of Food in the Last Year: Never true    Ran Out of Food in the Last Year: Never true  Transportation Needs: No Transportation Needs (08/14/2023)   PRAPARE - Administrator, Civil Service (Medical): No    Lack of Transportation (Non-Medical): No  Physical Activity: Insufficiently Active (02/28/2023)   Exercise Vital Sign    Days of Exercise per Week: 4 days    Minutes of Exercise per Session: 30 min  Stress: No Stress Concern Present (02/28/2023)   [name] of Occupational Health - Occupational Stress Questionnaire    Feeling of Stress : Not at all  Social Connections: Unknown (02/28/2023)   Social Connection and Isolation Panel [NHANES]    Frequency of Communication with Friends and Family: Twice a week    Frequency of Social Gatherings with Friends and Family: Not on file    Attends Religious Services: Never    Database administrator or Organizations: No    Attends Banker Meetings: Never    Marital Status: Widowed  Intimate Partner Violence: Not At Risk (08/14/2023)   Humiliation, Afraid, Rape, and Kick questionnaire    Fear of Current or Ex-Partner: No    Emotionally Abused: No    Physically Abused: No    Sexually Abused: No    Family History  Problem Relation Age of Onset   Cancer Mother        bladder   Emphysema Father    Emphysema Sister    COPD Sister    Cancer Brother        prostate     Vitals:   08/14/23 1430 08/14/23 1551  08/14/23 1600 08/14/23 1615  BP: (!) 137/108  (!) 131/100 (!) 131/102  Pulse: (!) 140  (!) 140 (!) 139  Resp: (!) 22     Temp:  98.2 F (36.8 C)    TempSrc:  Oral    SpO2: 95%  95% 93%  Weight:      Height:        PHYSICAL EXAM General: Well appearing male, well nourished, in no acute distress sitting upright in hospital bed with daughter present at bedside. HEENT: Normocephalic and atraumatic. Neck: No JVD.  Lungs: Normal respiratory effort on room air. Clear bilaterally to auscultation. No wheezes, crackles, rhonchi.  Heart: Irregular rhythm, fast rate. Normal S1 and S2 without gallops or murmurs.  Abdomen: Non-distended  appearing.  Msk: Normal strength and tone for age. Extremities: Warm and well perfused. No clubbing, cyanosis. No edema.  Neuro: Alert and oriented X 3. Psych: Answers questions appropriately.   Labs: Basic Metabolic Panel: Recent Labs    08/14/23 1136  NA 142  K 4.5  CL 112*  CO2 19*  GLUCOSE 145*  BUN 23  CREATININE 1.04  CALCIUM 9.6  MG 2.4   Liver Function Tests: No results for input(s): "AST", "ALT", "ALKPHOS", "BILITOT", "PROT", "ALBUMIN" in the last 72 hours. No results for input(s): "LIPASE", "AMYLASE" in the last 72 hours. CBC: Recent Labs    08/14/23 1136  WBC 9.7  HGB 17.5*  HCT 52.0  MCV 90.6  PLT 129*   Cardiac Enzymes: Recent Labs    08/14/23 1335  TROPONINIHS 8   BNP: No results for input(s): "BNP" in the last 72 hours. D-Dimer: No results for input(s): "DDIMER" in the last 72 hours. Hemoglobin A1C: No results for input(s): "HGBA1C" in the last 72 hours. Fasting Lipid Panel: No results for input(s): "CHOL", "HDL", "LDLCALC", "TRIG", "CHOLHDL", "LDLDIRECT" in the last 72 hours. Thyroid Function Tests: Recent Labs    08/14/23 1136  TSH 1.722   Anemia Panel: No results for input(s): "VITAMINB12", "FOLATE", "FERRITIN", "TIBC", "IRON", "RETICCTPCT" in the last 72 hours.   Radiology: [facility] Chest Port 1 View  Result  Date: 08/14/2023 CLINICAL DATA:  5107 Atrial fibrillation (HCC) 5107. EXAM: PORTABLE CHEST 1 VIEW COMPARISON:  01/13/2016. FINDINGS: There is probable left retrocardiac atelectasis/scarring. Bilateral lung fields are otherwise clear. No acute consolidation or lung collapse. Bilateral costophrenic angles are clear. Normal cardio-mediastinal silhouette. No acute osseous abnormalities. The soft tissues are within normal limits. IMPRESSION: No acute cardiopulmonary abnormality. Electronically Signed   By: Jules Schick M.D.   On: 08/14/2023 12:46   LONG TERM MONITOR (3-14 DAYS)  Result Date: 08/10/2023 HR 71 - 158, average 145 bpm Rare ventricular ectopy 100% atrial flutter burden Cardiology consult already pending from PCP. Sheria Lang T. Lalla Brothers, MD, [facility], [place] Cardiac Electrophysiology    ECHO pending  TELEMETRY reviewed by me 08/14/2023: atrial flutter rate 140s  EKG reviewed by me: atrial flutter RVR rate 142 bpm  Data reviewed by me 08/14/2023: last 24h vitals tele labs imaging I/O ED provider note, admission H&P  Principal Problem:   Atrial flutter with rapid ventricular response (HCC) Active Problems:   Type 2 diabetes mellitus with hyperglycemia, without long-term current use of insulin (HCC)   Hyperlipidemia associated with type 2 diabetes mellitus (HCC)   Severe recurrent major depression without psychotic features (HCC)    ASSESSMENT AND PLAN:  Kongpheng Esperanza is a 72 y.o. male  with a past medical history of hypertension, hyperlipidemia, and diabetes who presented to the ED on 08/14/2023 for tachycardia. EKG in the ED notable for atrial flutter with rate in the 140s. Cardiology was consulted for further evaluation.   # Atrial flutter RVR Patient recently diagnosed with atrial flutter and started on metoprolol for rate control as well as eliquis for stroke risk reduction 06/2023. HR has remained elevated and he was sent to the ED for further evaluation after PCP appointment this  AM. Rate in the 140s on admission. Initial troponin 8.  -Amiodarone infusion for rate/rhythm control.  -Continue home metoprolol succinate 100 mg daily for rate control.  -Continue IV metoprolol prn for rate control.  -DC heparin infusion. Restart home eliquis this evening.  -Will plan for TEE/CV tomorrow around 1 PM with Dr. [name] Sing. Risks/benefits  of procedure discussed with patient. He is amenable to proceeding.   # Hypertension # Hyperlipidemia BP stable since admission.  -Will hold home lisinopril. Plan to restart prior to discharge. -Restart home rosuvastatin.   This patient's plan of care was discussed and created with Dr. [name] Sing and he is in agreement.  Signed: Gale Journey, PA-C  08/14/2023, 4:39 PM [facility]

## 2023-08-14 NOTE — Assessment & Plan Note (Signed)
Appears stable  Cont home regimen for now Consider holding  luvox as SE profile includes palpitations

## 2023-08-15 ENCOUNTER — Inpatient Hospital Stay
Admit: 2023-08-15 | Discharge: 2023-08-15 | Disposition: A | Discharge status: Home / Self Care | Payer: Medicare HMO | Attending: Student

## 2023-08-15 ENCOUNTER — Inpatient Hospital Stay
Admit: 2023-08-15 | Discharge: 2023-08-15 | Disposition: A | Discharge status: Home / Self Care | Payer: Medicare HMO | Attending: Cardiology | Admitting: Cardiology

## 2023-08-15 ENCOUNTER — Inpatient Hospital Stay: Payer: Medicare HMO | Admitting: Anesthesiology

## 2023-08-15 ENCOUNTER — Other Ambulatory Visit: Payer: Self-pay

## 2023-08-15 ENCOUNTER — Encounter
Admission: EM | Disposition: A | Discharge status: Home / Self Care | Payer: Self-pay | Source: Ambulatory Visit | Attending: Internal Medicine

## 2023-08-15 DIAGNOSIS — E1165 Type 2 diabetes mellitus with hyperglycemia: Secondary | ICD-10-CM

## 2023-08-15 DIAGNOSIS — F332 Major depressive disorder, recurrent severe without psychotic features: Secondary | ICD-10-CM

## 2023-08-15 DIAGNOSIS — E1169 Type 2 diabetes mellitus with other specified complication: Secondary | ICD-10-CM | POA: Diagnosis not present

## 2023-08-15 DIAGNOSIS — I4892 Unspecified atrial flutter: Secondary | ICD-10-CM | POA: Diagnosis not present

## 2023-08-15 HISTORY — PX: TEE WITHOUT CARDIOVERSION: SHX5443

## 2023-08-15 HISTORY — PX: CARDIOVERSION: SHX1299

## 2023-08-15 LAB — COMPREHENSIVE METABOLIC PANEL
ALT: 29 U/L (ref 0–44)
AST: 18 U/L (ref 15–41)
Albumin: 4.2 g/dL (ref 3.5–5.0)
Alkaline Phosphatase: 103 U/L (ref 38–126)
Anion gap: 11 (ref 5–15)
BUN: 25 mg/dL — ABNORMAL HIGH (ref 8–23)
CO2: 20 mmol/L — ABNORMAL LOW (ref 22–32)
Calcium: 9.7 mg/dL (ref 8.9–10.3)
Chloride: 110 mmol/L (ref 98–111)
Creatinine, Ser: 1.03 mg/dL (ref 0.61–1.24)
GFR, Estimated: >60 mL/min (ref >60)
Glucose, Bld: 161 mg/dL — ABNORMAL HIGH (ref 70–99)
Potassium: 3.8 mmol/L (ref 3.5–5.1)
Sodium: 141 mmol/L (ref 135–145)
Total Bilirubin: 1.9 mg/dL — ABNORMAL HIGH (ref 0.3–1.2)
Total Protein: 6.8 g/dL (ref 6.5–8.1)

## 2023-08-15 LAB — GLUCOSE, CAPILLARY
Glucose-Capillary: 154 mg/dL — ABNORMAL HIGH (ref 70–99)
Glucose-Capillary: 164 mg/dL — ABNORMAL HIGH (ref 70–99)
Glucose-Capillary: 191 mg/dL — ABNORMAL HIGH (ref 70–99)
Glucose-Capillary: 153 mg/dL — ABNORMAL HIGH (ref 70–99)
Glucose-Capillary: 147 mg/dL — ABNORMAL HIGH (ref 70–99)

## 2023-08-15 LAB — ECHO TEE

## 2023-08-15 LAB — CBC
HCT: 53.5 % — ABNORMAL HIGH (ref 39.0–52.0)
Hemoglobin: 17.9 g/dL — ABNORMAL HIGH (ref 13.0–17.0)
MCH: 30.3 pg (ref 26.0–34.0)
MCHC: 33.5 g/dL (ref 30.0–36.0)
MCV: 90.7 fL (ref 80.0–100.0)
Platelets: 125 10*3/uL — ABNORMAL LOW (ref 150–400)
RBC: 5.9 MIL/uL — ABNORMAL HIGH (ref 4.22–5.81)
RDW: 13.2 % (ref 11.5–15.5)
WBC: 13.4 10*3/uL — ABNORMAL HIGH (ref 4.0–10.5)
nRBC: 0 % (ref 0.0–0.2)

## 2023-08-15 SURGERY — TRANSESOPHAGEAL ECHOCARDIOGRAM (TEE)
Anesthesia: General

## 2023-08-15 SURGERY — CARDIOVERSION
Anesthesia: General

## 2023-08-15 MED ORDER — OLANZAPINE 7.5 MG PO TABS
7.5000 mg | ORAL_TABLET | Freq: Every day | ORAL | Status: DC
  Administered 2023-08-15: 7.5 mg via ORAL
  Filled 2023-08-15 (×2): qty 1

## 2023-08-15 MED ORDER — LIDOCAINE HCL (CARDIAC) PF 100 MG/5ML IV SOSY
PREFILLED_SYRINGE | INTRAVENOUS | Status: DC | PRN
  Administered 2023-08-15: 100 mg via INTRAVENOUS

## 2023-08-15 MED ORDER — BUTAMBEN-TETRACAINE-BENZOCAINE 2-2-14 % EX AERO
INHALATION_SPRAY | CUTANEOUS | Status: AC
  Filled 2023-08-15: qty 5

## 2023-08-15 MED ORDER — PHENYLEPHRINE 80 MCG/ML (10ML) SYRINGE FOR IV PUSH (FOR BLOOD PRESSURE SUPPORT)
PREFILLED_SYRINGE | INTRAVENOUS | Status: DC | PRN
  Administered 2023-08-15 (×2): 80 ug via INTRAVENOUS
  Administered 2023-08-15: 160 ug via INTRAVENOUS

## 2023-08-15 MED ORDER — LIDOCAINE VISCOUS HCL 2 % MT SOLN
OROMUCOSAL | Status: AC
  Filled 2023-08-15: qty 15

## 2023-08-15 MED ORDER — PROPOFOL 10 MG/ML IV BOLUS
INTRAVENOUS | Status: DC | PRN
  Administered 2023-08-15: 20 mg via INTRAVENOUS
  Administered 2023-08-15: 10 mg via INTRAVENOUS
  Administered 2023-08-15 (×2): 20 mg via INTRAVENOUS
  Administered 2023-08-15: 40 mg via INTRAVENOUS

## 2023-08-15 MED ORDER — FUROSEMIDE 10 MG/ML IJ SOLN
40.0000 mg | Freq: Once | INTRAMUSCULAR | Status: AC
  Administered 2023-08-15: 40 mg via INTRAVENOUS
  Filled 2023-08-15: qty 4

## 2023-08-15 MED ORDER — SODIUM CHLORIDE 0.9 % IV SOLN: INTRAVENOUS | Status: AC

## 2023-08-15 NOTE — Anesthesia Procedure Notes (Signed)
Date/Time: 08/15/2023 1:27 PM  Performed by: Karoline Caldwell, CRNAPre-anesthesia Checklist: Patient identified, Emergency Drugs available, Suction available and Patient being monitored Patient Re-evaluated:Patient Re-evaluated prior to induction Oxygen Delivery Method: Nasal cannula

## 2023-08-15 NOTE — Progress Notes (Signed)
1      PROGRESS NOTE    Dillon [name]  GMW:[id number] DOB: 07/17/1951 DOA: 08/14/2023 PCP: Dillon Skiff, NP    Brief Narrative:   72 y.o. male with medical history significant of depression, OCD, hypertension, hyperlipidemia, type 2 diabetes, obesity, recent diagnosis of atrial flutter admitted for atrial flutter with RVR   10/28: Cardiology consult.   10/29 :successful cardioversion, EF 20 to 25% on TEE  Assessment & Plan:   Principal Problem:   Atrial flutter with rapid ventricular response (HCC) Active Problems:   Type 2 diabetes mellitus with hyperglycemia, without long-term current use of insulin (HCC)   Hyperlipidemia associated with type 2 diabetes mellitus (HCC)   Severe recurrent major depression without psychotic features (HCC)  Atrial flutter with RVR Status post successful cardioversion, TEE showing EF of 20 to 25% 2D echo pending Continue amiodarone and metoprolol for rate control Continue Eliquis for anticoagulation  Acute on chronic systolic CHF TEE showing EF of 20 to 25% Lasix 40 mg IV given once Further diuresis per cardiology  Severe recurrent major depression without psychotic features (HCC) Appears stable  Continue Zyprexa holding luvox as SE profile includes palpitations    Hyperlipidemia associated with type 2 diabetes mellitus (HCC) Continue statin   Type 2 diabetes mellitus with hyperglycemia, without long-term current use of insulin (HCC) Sliding-scale    DVT prophylaxis: Eliquis Place TED hose Start: 08/14/23 1333 SCDs Start: 08/14/23 1330 apixaban (ELIQUIS) tablet 5 mg     Code Status: (Full code Family Communication: None at bedside Disposition Plan: Possible discharge in next 2 to 3 days depending on clinical condition and cardiology workup   Consultants:  Cardiology  Procedures:  TEE/cardioversion    Subjective:  He does report orthopnea and shortness of breath.  He could not sleep well all night due to his  symptoms  Objective: Vitals:   08/15/23 1500 08/15/23 1515 08/15/23 1530 08/15/23 1545  BP: (!) 133/115 116/84 (!) 115/102 122/83  Pulse: 92 87 87 91  Resp: (!) 31 (!) 21 20 (!) 25  Temp:      TempSrc:      SpO2: 95% 96% 92% 98%  Weight:      Height:        Intake/Output Summary (Last 24 hours) at 08/15/2023 1610 Last data filed at 08/15/2023 1400 Gross per 24 hour  Intake 427.08 ml  Output 1425 ml  Net -997.92 ml   Filed Weights   08/14/23 1119 08/15/23 1207  Weight: 98.9 kg 98.9 kg    Examination:  General exam: Appears calm and comfortable  Respiratory system: Crackles at the bases Cardiovascular system: Irregularly irregular, tachycardic Gastrointestinal system: Abdomen is soft, benign Central nervous system: Alert and oriented. No focal neurological deficits. Extremities: Symmetric 5 x 5 power. Skin: No rashes, lesions or ulcers Psychiatry: Judgement and insight appear normal. Mood & affect appropriate.     Data Reviewed: I have personally reviewed following labs and imaging studies  CBC: Recent Labs  Lab 08/14/23 1136 08/15/23 0438  WBC 9.7 13.4*  HGB 17.5* 17.9*  HCT 52.0 53.5*  MCV 90.6 90.7  PLT 129* 125*   Basic Metabolic Panel: Recent Labs  Lab 08/14/23 1136 08/15/23 0438  NA 142 141  K 4.5 3.8  CL 112* 110  CO2 19* 20*  GLUCOSE 145* 161*  BUN 23 25*  CREATININE 1.04 1.03  CALCIUM 9.6 9.7  MG 2.4  --    GFR: Estimated Creatinine Clearance: 77.4 mL/min (by C-G  formula based on SCr of 1.03 mg/dL). Liver Function Tests: Recent Labs  Lab 08/15/23 0438  AST 18  ALT 29  ALKPHOS 103  BILITOT 1.9*  PROT 6.8  ALBUMIN 4.2   No results for input(s): "LIPASE", "AMYLASE" in the last 168 hours. No results for input(s): "AMMONIA" in the last 168 hours. Coagulation Profile: Recent Labs  Lab 08/14/23 1415  INR 1.5*   Cardiac Enzymes: No results for input(s): "CKTOTAL", "CKMB", "CKMBINDEX", "TROPONINI" in the last 168 hours. BNP  (last 3 results) No results for input(s): "PROBNP" in the last 8760 hours. HbA1C: No results for input(s): "HGBA1C" in the last 72 hours. CBG: Recent Labs  Lab 08/14/23 2138 08/15/23 0827 08/15/23 1120 08/15/23 1203 08/15/23 1542  GLUCAP 138* 154* 164* 191* 153*   Lipid Profile: No results for input(s): "CHOL", "HDL", "LDLCALC", "TRIG", "CHOLHDL", "LDLDIRECT" in the last 72 hours. Thyroid Function Tests: Recent Labs    08/14/23 1136  TSH 1.722  FREET4 1.05   Anemia Panel: No results for input(s): "VITAMINB12", "FOLATE", "FERRITIN", "TIBC", "IRON", "RETICCTPCT" in the last 72 hours. Sepsis Labs: No results for input(s): "PROCALCITON", "LATICACIDVEN" in the last 168 hours.  No results found for this or any previous visit (from the past 240 hour(s)).       Radiology Studies: DG Chest Port 1 View  Result Date: 08/14/2023 CLINICAL DATA:  5107 Atrial fibrillation (HCC) 5107. EXAM: PORTABLE CHEST 1 VIEW COMPARISON:  01/13/2016. FINDINGS: There is probable left retrocardiac atelectasis/scarring. Bilateral lung fields are otherwise clear. No acute consolidation or lung collapse. Bilateral costophrenic angles are clear. Normal cardio-mediastinal silhouette. No acute osseous abnormalities. The soft tissues are within normal limits. IMPRESSION: No acute cardiopulmonary abnormality. Electronically Signed   By: Jules Schick M.D.   On: 08/14/2023 12:46        Scheduled Meds:  apixaban  5 mg Oral BID   insulin aspart  0-6 Units Subcutaneous TID WC   metoprolol succinate  100 mg Oral Daily   rosuvastatin  10 mg Oral QHS   Continuous Infusions:  sodium chloride 20 mL/hr at 08/15/23 1325   amiodarone 30 mg/hr (08/15/23 0800)     LOS: 1 day    Time spent: 35 minutes    [name], MD [facility] Pager [phone]  If 7PM-7AM, please contact night-coverage [email or web address] Password [email or web address] 08/15/2023, 4:10 PM

## 2023-08-15 NOTE — Plan of Care (Signed)

## 2023-08-15 NOTE — Transfer of Care (Signed)
Immediate Anesthesia Transfer of Care Note  Patient: Dillon [name]  Procedure(s) Performed: CARDIOVERSION TRANSESOPHAGEAL ECHOCARDIOGRAM  Patient Location: PACU  Anesthesia Type:General  Level of Consciousness: awake, alert , and oriented  Airway & Oxygen Therapy: Patient Spontanous Breathing  Post-op Assessment: Report given to RN and Post -op Vital signs reviewed and stable  Post vital signs: Reviewed and stable  Last Vitals:  Vitals Value Taken Time  BP 110/91 08/15/23 1343  Temp    Pulse 67 08/15/23 1344  Resp 30 08/15/23 1344  SpO2 91 % 08/15/23 1344    Last Pain:  Vitals:   08/15/23 1207  TempSrc: Oral  PainSc: 0-No pain         Complications: No notable events documented.

## 2023-08-15 NOTE — Progress Notes (Signed)
Transition of Care [facility]) - Inpatient Brief Assessment   Patient Details  Name: Dillon [name] MRN: [id number] Date of Birth: 07/26/1951  Transition of Care [facility]) CM/SW Contact:    Truddie Hidden, RN Phone Number: 08/15/2023, 1:52 PM   Clinical Narrative: TOC continuing to follow patient's progress throughout discharge planning.   Transition of Care Asessment: Insurance and Status: Insurance coverage has been reviewed Patient has primary care physician: Yes Home environment has been reviewed: Home Prior level of function:: independent Prior/Current Home Services: No current home services Social Determinants of Health Reivew: SDOH reviewed no interventions necessary Readmission risk has been reviewed: Yes Transition of care needs: no transition of care needs at this time

## 2023-08-15 NOTE — Progress Notes (Signed)
*  PRELIMINARY RESULTS* Echocardiogram Echocardiogram Transesophageal has been performed.  Cristela Blue 08/15/2023, 1:52 PM

## 2023-08-15 NOTE — Procedures (Signed)
Electrical Cardioversion Procedure Note  Indication: Atrial flutter  Procedure Details: Consent: Indication, Risk/benefits of procedure as well as the alternatives explained to patient and informed consent obtained. Time out performed. Verified patient identification, verified procedure, verified correct patient position, special equipment/implants available, medications/allergies/relevent history reviewed, required imaging and test results reviewed.  Deep sedation was provided by anesthesia with propofol. Patient was delivered with 200 Joules of electricity X 1 with success to Sinus rhythm. Patient tolerated the procedure well. No immediate complication noted.   Successful cardioversion  Windell Norfolk, MD [facility] Cardiology- [facility]

## 2023-08-15 NOTE — Progress Notes (Signed)
[facility] CLINIC CARDIOLOGY PROGRESS NOTE       Patient ID: Dillon [name] MRN: [id number] DOB/AGE: 70-22-1952 70 y.o.  Admit date: 08/14/2023 Referring Physician Dr. Pilar Jarvis Primary Physician Marjie Skiff, NP  Primary Cardiologist Dr. Melton Alar Reason for Consultation atrial flutter RVR  HPI: Dillon [name] is a 72 y.o. male  with a past medical history of hypertension, hyperlipidemia, and diabetes who presented to the ED on 08/14/2023 for tachycardia. EKG in the ED notable for atrial flutter with rate in the 140s. Cardiology was consulted for further evaluation.   Interval history: -Patient reports that he did not sleep well.  Endorses orthopnea overnight and was placed on 2 L supplemental O2. -Denies any chest pain or palpitations.  Heart rate mildly improved to 130s on telemetry, remains in atrial flutter. -Plan for TEE/cardioversion today.  Risks and benefits discussed with patient.  Review of systems complete and found to be negative unless listed above    Past Medical History:  Diagnosis Date   Anomalies of urachus, congenital    BPH with urinary obstruction    Depression    Diabetes mellitus without complication (HCC)    History of kidney stones    Hydronephrosis    Hyperlipidemia    Hypernatremia    Hypokalemia    Kidney stone    Macular degeneration    Meningitis    spinal   OCD (obsessive compulsive disorder)    Protein-calorie malnutrition, severe (HCC)    Shingles    Thrombocytopenia (HCC)     Past Surgical History:  Procedure Laterality Date   CYSTOSCOPY W/ URETERAL STENT PLACEMENT Right 10/12/2022   Procedure: CYSTOSCOPY WITH RETROGRADE PYELOGRAM/URETERAL STENT PLACEMENT;  Surgeon: Riki Altes, MD;  Location: [facility] ORS;  Service: Urology;  Laterality: Right;   CYSTOSCOPY/URETEROSCOPY/HOLMIUM LASER/STENT PLACEMENT Right 12/13/2022   Procedure: CYSTOSCOPY/URETEROSCOPY/HOLMIUM LASER/STENT EXCHANGE;  Surgeon: Riki Altes, MD;   Location: [facility] ORS;  Service: Urology;  Laterality: Right;   CYSTOSCOPY/URETEROSCOPY/HOLMIUM LASER/STENT PLACEMENT Right 12/27/2022   Procedure: CYSTOSCOPY/URETEROSCOPY/HOLMIUM LASER/STENT EXCHANGE;  Surgeon: Riki Altes, MD;  Location: [facility] ORS;  Service: Urology;  Laterality: Right;   KIDNEY STONE SURGERY      Medications Prior to Admission  Medication Sig Dispense Refill Last Dose   apixaban (ELIQUIS) 5 MG TABS tablet Take 1 tablet (5 mg total) by mouth 2 (two) times daily. 60 tablet 2 08/13/2023 at 2000   cyanocobalamin (VITAMIN B12) 1000 MCG tablet Take 1 tablet (1,000 mcg total) by mouth daily. 90 tablet 4    empagliflozin (JARDIANCE) 25 MG TABS tablet Take 1 tablet (25 mg total) by mouth daily before breakfast. 90 tablet 4 08/13/2023 at 0800   fluvoxaMINE (LUVOX) 100 MG tablet Take 1 tablet (100 mg total) by mouth 2 (two) times daily at 8 am and 4 pm. 180 tablet 0 08/13/2023 at 1800   glucose blood (ACCU-CHEK GUIDE) test strip Use to check blood sugars 2-3 times daily with goals = <130 fasting in morning and <180 two hours after eating. Bring blood sugar log to appointments. 100 each 12    lisinopril (ZESTRIL) 5 MG tablet Take 1 tablet (5 mg total) by mouth daily. 90 tablet 4 08/13/2023 at Unknown   metoprolol succinate (TOPROL-XL) 100 MG 24 hr tablet Take 100 mg by mouth daily. Take with or immediately following a meal.   08/13/2023 at Unknown   Multiple Vitamins-Minerals (ICAPS AREDS 2 PO) Take 1 tablet by mouth 2 (two) times daily.  OLANZapine (ZYPREXA) 7.5 MG tablet Take 1 tablet (7.5 mg total) by mouth at bedtime. 90 tablet 0 08/13/2023 at 2000   rosuvastatin (CRESTOR) 10 MG tablet Take 1 tablet (10 mg total) by mouth at bedtime. 90 tablet 4 Unknown at Unknown   sitaGLIPtin (JANUVIA) 100 MG tablet Take 1 tablet (100 mg total) by mouth daily. 90 tablet 4 Unknown at Unknown   tamsulosin (FLOMAX) 0.4 MG CAPS capsule Take 1 capsule (0.4 mg total) by mouth daily after breakfast. 30  capsule 11 08/13/2023 at 0800   Vitamin D, Ergocalciferol, (DRISDOL) 1.25 MG (50000 UNIT) CAPS capsule Take 1 capsule (50,000 Units total) by mouth every 7 (seven) days. (Patient taking differently: Take 50,000 Units by mouth every Sunday.) 12 capsule 4    oxybutynin (DITROPAN) 5 MG tablet 1 tab tid prn frequency,urgency, bladder spasm (Patient not taking: Reported on 08/14/2023) 30 tablet 1 Not Taking   traZODone (DESYREL) 50 MG tablet Take 1 tablet (50 mg total) by mouth at bedtime. (Patient not taking: Reported on 08/14/2023) 90 tablet 4 Not Taking   Social History   Socioeconomic History   Marital status: Widowed    Spouse name: Not on file   Number of children: Not on file   Years of education: Not on file   Highest education level: GED or equivalent  Occupational History   Occupation: retired  Tobacco Use   Smoking status: Never   Smokeless tobacco: Never  Vaping Use   Vaping status: Never Used  Substance and Sexual Activity   Alcohol use: No    Alcohol/week: 0.0 standard drinks of alcohol   Drug use: No   Sexual activity: Not Currently  Other Topics Concern   Not on file  Social History Narrative   Live alone and  have two children that are very supportive.   Social Determinants of Health   Financial Resource Strain: Low Risk  (02/28/2023)   Overall Financial Resource Strain (CARDIA)    Difficulty of Paying Living Expenses: Not hard at all  Food Insecurity: No Food Insecurity (08/14/2023)   Hunger Vital Sign    Worried About Running Out of Food in the Last Year: Never true    Ran Out of Food in the Last Year: Never true  Transportation Needs: No Transportation Needs (08/14/2023)   PRAPARE - Administrator, Civil Service (Medical): No    Lack of Transportation (Non-Medical): No  Physical Activity: Insufficiently Active (02/28/2023)   Exercise Vital Sign    Days of Exercise per Week: 4 days    Minutes of Exercise per Session: 30 min  Stress: No Stress  Concern Present (02/28/2023)   [name] of Occupational Health - Occupational Stress Questionnaire    Feeling of Stress : Not at all  Social Connections: Unknown (02/28/2023)   Social Connection and Isolation Panel [NHANES]    Frequency of Communication with Friends and Family: Twice a week    Frequency of Social Gatherings with Friends and Family: Not on file    Attends Religious Services: Never    Database administrator or Organizations: No    Attends Banker Meetings: Never    Marital Status: Widowed  Intimate Partner Violence: Not At Risk (08/14/2023)   Humiliation, Afraid, Rape, and Kick questionnaire    Fear of Current or Ex-Partner: No    Emotionally Abused: No    Physically Abused: No    Sexually Abused: No    Family History  Problem Relation Age of  Onset   Cancer Mother        bladder   Emphysema Father    Emphysema Sister    COPD Sister    Cancer Brother        prostate     Vitals:   08/15/23 0600 08/15/23 0640 08/15/23 0700 08/15/23 0752  BP: (!) 127/100   (!) 134/96  Pulse:    (!) 133  Resp: 11 20 (!) 26 20  Temp:    (!) 97.5 F (36.4 C)  TempSrc:    Oral  SpO2:    95%  Weight:      Height:        PHYSICAL EXAM General: Well appearing male, well nourished, in no acute distress sitting upright in hospital bed HEENT: Normocephalic and atraumatic. Neck: No JVD.  Lungs: Normal respiratory effort on 2L [place].  Mild bibasilar crackles. Heart: Irregular rhythm, fast rate. Normal S1 and S2 without gallops or murmurs.  Abdomen: Non-distended appearing.  Msk: Normal strength and tone for age. Extremities: Warm and well perfused. No clubbing, cyanosis. No edema.  Neuro: Alert and oriented X 3. Psych: Answers questions appropriately.   Labs: Basic Metabolic Panel: Recent Labs    08/14/23 1136 08/15/23 0438  NA 142 141  K 4.5 3.8  CL 112* 110  CO2 19* 20*  GLUCOSE 145* 161*  BUN 23 25*  CREATININE 1.04 1.03  CALCIUM 9.6 9.7  MG 2.4   --    Liver Function Tests: Recent Labs    08/15/23 0438  AST 18  ALT 29  ALKPHOS 103  BILITOT 1.9*  PROT 6.8  ALBUMIN 4.2   No results for input(s): "LIPASE", "AMYLASE" in the last 72 hours. CBC: Recent Labs    08/14/23 1136 08/15/23 0438  WBC 9.7 13.4*  HGB 17.5* 17.9*  HCT 52.0 53.5*  MCV 90.6 90.7  PLT 129* 125*   Cardiac Enzymes: Recent Labs    08/14/23 1335 08/14/23 1559  TROPONINIHS 8 9   BNP: No results for input(s): "BNP" in the last 72 hours. D-Dimer: No results for input(s): "DDIMER" in the last 72 hours. Hemoglobin A1C: No results for input(s): "HGBA1C" in the last 72 hours. Fasting Lipid Panel: No results for input(s): "CHOL", "HDL", "LDLCALC", "TRIG", "CHOLHDL", "LDLDIRECT" in the last 72 hours. Thyroid Function Tests: Recent Labs    08/14/23 1136  TSH 1.722   Anemia Panel: No results for input(s): "VITAMINB12", "FOLATE", "FERRITIN", "TIBC", "IRON", "RETICCTPCT" in the last 72 hours.   Radiology: [facility] Chest Port 1 View  Result Date: 08/14/2023 CLINICAL DATA:  5107 Atrial fibrillation (HCC) 5107. EXAM: PORTABLE CHEST 1 VIEW COMPARISON:  01/13/2016. FINDINGS: There is probable left retrocardiac atelectasis/scarring. Bilateral lung fields are otherwise clear. No acute consolidation or lung collapse. Bilateral costophrenic angles are clear. Normal cardio-mediastinal silhouette. No acute osseous abnormalities. The soft tissues are within normal limits. IMPRESSION: No acute cardiopulmonary abnormality. Electronically Signed   By: Jules Schick M.D.   On: 08/14/2023 12:46   LONG TERM MONITOR (3-14 DAYS)  Result Date: 08/10/2023 HR 71 - 158, average 145 bpm Rare ventricular ectopy 100% atrial flutter burden Cardiology consult already pending from PCP. Sheria Lang T. Lalla Brothers, MD, [facility], [facility] Cardiac Electrophysiology    ECHO pending  TELEMETRY reviewed by me 08/15/2023: Atrial flutter rate 130s  EKG reviewed by me: atrial flutter RVR rate 142 bpm  Data  reviewed by me 08/15/2023: last 24h vitals tele labs imaging I/O hospitalist progress note  Principal Problem:   Atrial flutter  with rapid ventricular response (HCC) Active Problems:   Type 2 diabetes mellitus with hyperglycemia, without long-term current use of insulin (HCC)   Hyperlipidemia associated with type 2 diabetes mellitus (HCC)   Severe recurrent major depression without psychotic features (HCC)    ASSESSMENT AND PLAN:  Dillon [name] is a 72 y.o. male  with a past medical history of hypertension, hyperlipidemia, and diabetes who presented to the ED on 08/14/2023 for tachycardia. EKG in the ED notable for atrial flutter with rate in the 140s. Cardiology was consulted for further evaluation.   # Atrial flutter RVR Patient recently diagnosed with atrial flutter and started on metoprolol for rate control as well as eliquis for stroke risk reduction 06/2023. HR has remained elevated and he was sent to the ED for further evaluation after PCP appointment this AM. Rate in the 140s on admission. Troponin trended 8 > 9.  -Amiodarone infusion for rate/rhythm control.  -Continue home metoprolol succinate 100 mg daily for rate control.  -Continue IV metoprolol prn for rate control.  -Continue Eliquis 5 mg twice daily. -Will plan for TEE/CV this afternoon with Dr. [name] Sing. Risks/benefits of procedure discussed with patient. He is amenable to proceeding.   # Orthopnea With episode of orthopnea and desaturation overnight requiring supplemental O2.  Concern for heart failure given persistent atrial flutter with fast rate for multiple weeks.  -Will give IV Lasix 40 mg x 1 today -EF to be assessed during TEE today  # Hypertension # Hyperlipidemia BP stable since admission.  -Will hold home lisinopril. Plan to restart prior to discharge. -Continue home rosuvastatin.   This patient's plan of care was discussed and created with Dr. [name] Sing and he is in agreement.  Signed: Gale Journey, PA-C   08/15/2023, 9:09 AM [facility] Cardiology

## 2023-08-15 NOTE — Anesthesia Preprocedure Evaluation (Signed)
Anesthesia Evaluation  Patient identified by MRN, date of birth, ID band Patient awake    Reviewed: Allergy & Precautions, NPO status , Patient's Chart, lab work & pertinent test results  History of Anesthesia Complications Negative for: history of anesthetic complications  Airway Mallampati: III  TM Distance: <3 FB Neck ROM: full    Dental  (+) Chipped, Poor Dentition, Missing   Pulmonary neg pulmonary ROS   Pulmonary exam normal        Cardiovascular Exercise Tolerance: Good (-) angina (-) Past MI negative cardio ROS  Rhythm:irregular Rate:Tachycardia     Neuro/Psych  PSYCHIATRIC DISORDERS      negative neurological ROS     GI/Hepatic negative GI ROS, Neg liver ROS,,,  Endo/Other  negative endocrine ROSdiabetes, Type 2    Renal/GU Renal disease  negative genitourinary   Musculoskeletal   Abdominal   Peds  Hematology negative hematology ROS (+)   Anesthesia Other Findings Past Medical History: No date: Anomalies of urachus, congenital No date: BPH with urinary obstruction No date: Depression No date: Diabetes mellitus without complication (HCC) No date: History of kidney stones No date: Hydronephrosis No date: Hyperlipidemia No date: Hypernatremia No date: Hypokalemia No date: Kidney stone No date: Macular degeneration No date: Meningitis     Comment:  spinal No date: OCD (obsessive compulsive disorder) No date: Protein-calorie malnutrition, severe (HCC) No date: Shingles No date: Thrombocytopenia (HCC)  Past Surgical History: 10/12/2022: CYSTOSCOPY W/ URETERAL STENT PLACEMENT; Right     Comment:  Procedure: CYSTOSCOPY WITH RETROGRADE PYELOGRAM/URETERAL              STENT PLACEMENT;  Surgeon: Riki Altes, MD;                Location: [facility] ORS;  Service: Urology;  Laterality:               Right; 12/13/2022: CYSTOSCOPY/URETEROSCOPY/HOLMIUM LASER/STENT PLACEMENT;  Right     Comment:   Procedure: CYSTOSCOPY/URETEROSCOPY/HOLMIUM LASER/STENT               EXCHANGE;  Surgeon: Riki Altes, MD;  Location: [facility]              ORS;  Service: Urology;  Laterality: Right; 12/27/2022: CYSTOSCOPY/URETEROSCOPY/HOLMIUM LASER/STENT PLACEMENT;  Right     Comment:  Procedure: CYSTOSCOPY/URETEROSCOPY/HOLMIUM LASER/STENT               EXCHANGE;  Surgeon: Riki Altes, MD;  Location: [facility]              ORS;  Service: Urology;  Laterality: Right; No date: KIDNEY STONE SURGERY  BMI    Body Mass Index: 32.20 kg/m      Reproductive/Obstetrics negative OB ROS                             Anesthesia Physical Anesthesia Plan  ASA: 4  Anesthesia Plan: General   Post-op Pain Management:    Induction: Intravenous  PONV Risk Score and Plan: Propofol infusion and TIVA  Airway Management Planned: Natural Airway and Nasal Cannula  Additional Equipment:   Intra-op Plan:   Post-operative Plan:   Informed Consent: I have reviewed the patients History and Physical, chart, labs and discussed the procedure including the risks, benefits and alternatives for the proposed anesthesia with the patient or authorized representative who has indicated his/her understanding and acceptance.     Dental Advisory Given  Plan Discussed with: Anesthesiologist, CRNA and  Surgeon  Anesthesia Plan Comments: (Patient consented for risks of anesthesia including but not limited to:  - adverse reactions to medications - risk of airway placement if required - damage to eyes, teeth, lips or other oral mucosa - nerve damage due to positioning  - sore throat or hoarseness - Damage to heart, brain, nerves, lungs, other parts of body or loss of life  Patient voiced understanding and assent.)       Anesthesia Quick Evaluation

## 2023-08-16 DIAGNOSIS — F332 Major depressive disorder, recurrent severe without psychotic features: Secondary | ICD-10-CM | POA: Diagnosis not present

## 2023-08-16 DIAGNOSIS — E1165 Type 2 diabetes mellitus with hyperglycemia: Secondary | ICD-10-CM | POA: Diagnosis not present

## 2023-08-16 DIAGNOSIS — I4892 Unspecified atrial flutter: Secondary | ICD-10-CM | POA: Diagnosis not present

## 2023-08-16 DIAGNOSIS — E1169 Type 2 diabetes mellitus with other specified complication: Secondary | ICD-10-CM | POA: Diagnosis not present

## 2023-08-16 LAB — BASIC METABOLIC PANEL
Anion gap: 8 (ref 5–15)
BUN: 29 mg/dL — ABNORMAL HIGH (ref 8–23)
CO2: 24 mmol/L (ref 22–32)
Calcium: 9 mg/dL (ref 8.9–10.3)
Chloride: 107 mmol/L (ref 98–111)
Creatinine, Ser: 1.32 mg/dL — ABNORMAL HIGH (ref 0.61–1.24)
GFR, Estimated: 58 mL/min — ABNORMAL LOW (ref >60)
Glucose, Bld: 133 mg/dL — ABNORMAL HIGH (ref 70–99)
Potassium: 3.2 mmol/L — ABNORMAL LOW (ref 3.5–5.1)
Sodium: 139 mmol/L (ref 135–145)

## 2023-08-16 LAB — GLUCOSE, CAPILLARY
Glucose-Capillary: 136 mg/dL — ABNORMAL HIGH (ref 70–99)
Glucose-Capillary: 134 mg/dL — ABNORMAL HIGH (ref 70–99)

## 2023-08-16 LAB — CBC
HCT: 46.8 % (ref 39.0–52.0)
Hemoglobin: 15.4 g/dL (ref 13.0–17.0)
MCH: 30.2 pg (ref 26.0–34.0)
MCHC: 32.9 g/dL (ref 30.0–36.0)
MCV: 91.8 fL (ref 80.0–100.0)
Platelets: 106 10*3/uL — ABNORMAL LOW (ref 150–400)
RBC: 5.1 MIL/uL (ref 4.22–5.81)
RDW: 13.3 % (ref 11.5–15.5)
WBC: 9 10*3/uL (ref 4.0–10.5)
nRBC: 0 % (ref 0.0–0.2)

## 2023-08-16 LAB — ECHOCARDIOGRAM LIMITED
Height: 69 in
S' Lateral: 3.9 cm
Weight: 3488.56 [oz_av]

## 2023-08-16 MED ORDER — FUROSEMIDE 20 MG PO TABS: 20.0000 mg | ORAL_TABLET | ORAL | Status: DC

## 2023-08-16 MED ORDER — POTASSIUM CHLORIDE 20 MEQ PO PACK
40.0000 meq | PACK | Freq: Every day | ORAL | Status: DC
  Administered 2023-08-16: 40 meq via ORAL
  Filled 2023-08-16: qty 2

## 2023-08-16 MED ORDER — AMIODARONE HCL 200 MG PO TABS: 200.0000 mg | ORAL_TABLET | Freq: Every day | ORAL | Status: DC

## 2023-08-16 MED ORDER — AMIODARONE HCL 200 MG PO TABS
400.0000 mg | ORAL_TABLET | Freq: Two times a day (BID) | ORAL | Status: DC
  Administered 2023-08-16: 400 mg via ORAL
  Filled 2023-08-16: qty 2

## 2023-08-16 MED ORDER — POTASSIUM CHLORIDE CRYS ER 20 MEQ PO TBCR
40.0000 meq | EXTENDED_RELEASE_TABLET | Freq: Once | ORAL | Status: AC
  Administered 2023-08-16: 40 meq via ORAL
  Filled 2023-08-16: qty 2

## 2023-08-16 MED ORDER — FUROSEMIDE 20 MG PO TABS: 20.0000 mg | ORAL_TABLET | ORAL | 0 refills | Status: DC

## 2023-08-16 MED ORDER — AMIODARONE HCL 200 MG PO TABS: ORAL_TABLET | ORAL | 0 refills | Status: DC

## 2023-08-16 NOTE — Anesthesia Postprocedure Evaluation (Signed)
Anesthesia Post Note  Patient: Geraldo Queiroz  Procedure(s) Performed: CARDIOVERSION TRANSESOPHAGEAL ECHOCARDIOGRAM  Patient location during evaluation: Specials Recovery Anesthesia Type: General Level of consciousness: awake and alert Pain management: pain level controlled Vital Signs Assessment: post-procedure vital signs reviewed and stable Respiratory status: spontaneous breathing, nonlabored ventilation, respiratory function stable and patient connected to nasal cannula oxygen Cardiovascular status: blood pressure returned to baseline and stable Postop Assessment: no apparent nausea or vomiting Anesthetic complications: no   No notable events documented.   Last Vitals:  Vitals:   08/16/23 0732 08/16/23 1141  BP: 109/73 107/83  Pulse: 86 75  Resp: 16 16  Temp: 36.6 C 36.6 C  SpO2: 92% 95%    Last Pain:  Vitals:   08/16/23 0732  TempSrc: Oral  PainSc: 0-No pain                 Cleda Mccreedy Zeddie Njie

## 2023-08-16 NOTE — Discharge Summary (Signed)
[facility] Procedure: 2D Echo, Cardiac Doppler, Limited Echo and Limited Color Doppler Indications:     I42.9 Cradiomyopathy  History:         Patient has no prior history of Echocardiogram examinations.                  TEE-08/15/23; Risk Factors:Diabetes and Dyslipidemia.  Sonographer:     Taurino Figeroa RDCS Referring Phys:  [id number] [facility] Diagnosing Phys: Mellody Drown Alluri IMPRESSIONS  1. Left ventricular ejection fraction, by estimation, is 30 to 35%. The left ventricle has moderate to severely decreased function. The left ventricle demonstrates global hypokinesis.  2. Right ventricular systolic function is mildly reduced. The right ventricular size is normal.  3. Left atrial size was dilated.  4. The mitral valve is normal in structure. Mild to moderate mitral valve regurgitation.  5. The aortic valve is tricuspid. Aortic valve regurgitation is not visualized.  6. The inferior vena cava is dilated in size with <50% respiratory variability, suggesting right atrial pressure of 15 mmHg. FINDINGS  Left Ventricle: Left ventricular ejection fraction, by estimation, is 30 to 35%. The left ventricle has moderate to severely decreased function. The left ventricle demonstrates global hypokinesis. The left ventricular internal cavity size was normal in size. Right Ventricle: The right ventricular size is normal. No increase in right ventricular wall thickness. Right ventricular systolic function is mildly reduced. Left Atrium: Left atrial size was dilated. Pericardium: There is no evidence of pericardial effusion. Mitral Valve: The  mitral valve is normal in structure. Mild to moderate mitral valve regurgitation. Tricuspid Valve: The tricuspid valve is normal in structure. Tricuspid valve regurgitation is mild. Aortic Valve: The aortic valve is tricuspid. Aortic valve regurgitation is not visualized. Pulmonic Valve: The pulmonic valve was not well visualized. Aorta: The aortic root is normal in size and structure. Venous: The inferior vena cava is dilated in size with less than 50% respiratory variability, suggesting right atrial pressure of 15 mmHg. LEFT VENTRICLE PLAX 2D LVIDd:         5.60 cm   Diastology LVIDs:         3.90 cm   LV e' medial:  6.20 cm/s LV PW:         0.80 cm   LV e' lateral: 9.07 cm/s LV IVS:        0.80 cm LVOT diam:     2.00 cm LVOT Area:     3.14 cm  RIGHT VENTRICLE            IVC RV S prime:     9.14 cm/s  IVC diam: 2.20 cm LEFT ATRIUM         Index LA diam:    4.90 cm 2.29 cm/m   AORTA Ao Root diam: 3.40 cm TRICUSPID VALVE TR Peak grad:   23.4 mmHg TR Vmax:        242.00 cm/s  SHUNTS Systemic Diam: 2.00 cm Windell Norfolk Electronically signed by Windell Norfolk Signature Date/Time: 08/16/2023/8:17:58 AM    Final    ECHO TEE  Result Date: 08/15/2023    TRANSESOPHOGEAL ECHO REPORT   Patient Name:   Dillon [name] Date of Exam: 08/15/2023 Medical Rec #:  [id number]          Height:       69.0 in Accession #:    [id number]         Weight:       218.0 lb Date of Birth:  October 14, 1951  [facility] Procedure: 2D Echo, Cardiac Doppler, Limited Echo and Limited Color Doppler Indications:     I42.9 Cradiomyopathy  History:         Patient has no prior history of Echocardiogram examinations.                  TEE-08/15/23; Risk Factors:Diabetes and Dyslipidemia.  Sonographer:     Taurino Figeroa RDCS Referring Phys:  [id number] [facility] Diagnosing Phys: Mellody Drown Alluri IMPRESSIONS  1. Left ventricular ejection fraction, by estimation, is 30 to 35%. The left ventricle has moderate to severely decreased function. The left ventricle demonstrates global hypokinesis.  2. Right ventricular systolic function is mildly reduced. The right ventricular size is normal.  3. Left atrial size was dilated.  4. The mitral valve is normal in structure. Mild to moderate mitral valve regurgitation.  5. The aortic valve is tricuspid. Aortic valve regurgitation is not visualized.  6. The inferior vena cava is dilated in size with <50% respiratory variability, suggesting right atrial pressure of 15 mmHg. FINDINGS  Left Ventricle: Left ventricular ejection fraction, by estimation, is 30 to 35%. The left ventricle has moderate to severely decreased function. The left ventricle demonstrates global hypokinesis. The left ventricular internal cavity size was normal in size. Right Ventricle: The right ventricular size is normal. No increase in right ventricular wall thickness. Right ventricular systolic function is mildly reduced. Left Atrium: Left atrial size was dilated. Pericardium: There is no evidence of pericardial effusion. Mitral Valve: The  mitral valve is normal in structure. Mild to moderate mitral valve regurgitation. Tricuspid Valve: The tricuspid valve is normal in structure. Tricuspid valve regurgitation is mild. Aortic Valve: The aortic valve is tricuspid. Aortic valve regurgitation is not visualized. Pulmonic Valve: The pulmonic valve was not well visualized. Aorta: The aortic root is normal in size and structure. Venous: The inferior vena cava is dilated in size with less than 50% respiratory variability, suggesting right atrial pressure of 15 mmHg. LEFT VENTRICLE PLAX 2D LVIDd:         5.60 cm   Diastology LVIDs:         3.90 cm   LV e' medial:  6.20 cm/s LV PW:         0.80 cm   LV e' lateral: 9.07 cm/s LV IVS:        0.80 cm LVOT diam:     2.00 cm LVOT Area:     3.14 cm  RIGHT VENTRICLE            IVC RV S prime:     9.14 cm/s  IVC diam: 2.20 cm LEFT ATRIUM         Index LA diam:    4.90 cm 2.29 cm/m   AORTA Ao Root diam: 3.40 cm TRICUSPID VALVE TR Peak grad:   23.4 mmHg TR Vmax:        242.00 cm/s  SHUNTS Systemic Diam: 2.00 cm Windell Norfolk Electronically signed by Windell Norfolk Signature Date/Time: 08/16/2023/8:17:58 AM    Final    ECHO TEE  Result Date: 08/15/2023    TRANSESOPHOGEAL ECHO REPORT   Patient Name:   Dillon [name] Date of Exam: 08/15/2023 Medical Rec #:  [id number]          Height:       69.0 in Accession #:    [id number]         Weight:       218.0 lb Date of Birth:  October 14, 1951  Physician Discharge Summary   Patient: Dillon [name] MRN: [id number] DOB: 02/26/1951  Admit date:     08/14/2023  Discharge date: 08/16/23  Discharge Physician: Dillon Duster   PCP: Marjie Skiff, NP   Recommendations at discharge:    PCP follow up in  1 week. Cardiology follow up as suggested.  Discharge Diagnoses: Principal Problem:   Atrial flutter with rapid ventricular response (HCC) Active Problems:   Type 2 diabetes mellitus with hyperglycemia, without long-term current use of insulin (HCC)   Hyperlipidemia associated with type 2 diabetes mellitus (HCC)   Severe recurrent major depression without psychotic features (HCC)  Resolved Problems:   * No resolved hospital problems. *  Hospital Course: Dillon [name] is a 72 y.o. male with medical history significant of depression, OCD, hypertension, hyperlipidemia, type 2 diabetes, obesity, recent diagnosis of atrial flutter admitted for atrial flutter with RVR    10/28: Cardiology consult. Plan for DCCV. 10/29: successful cardioversion, EF 20 to 25% on TEE  Assessment and Plan: * Atrial flutter with rapid ventricular response (HCC) Status post successful cardioversion, TEE showing EF of 20 to 25% 2D echo limited showed EF 30-35%.  Low EF possibly in the setting of A-fib. Cardiology advised amiodarone 400 mg bid for 7 days followed by 200mg  daily.  GDMT to be optimized as outpatient if his BP and renal function tolerates. Continue home dose metoprolol and Eliquis for anticoagulation. Due to risk of QT prolongation, I held his Olanzapine for now, advised to talk to primary psychiatry.  Severe recurrent major depression without psychotic features (HCC) Appears stable. Cont home regimen for now Held Olanzapine due to high risk of QT prolongation.   Hyperlipidemia Continue statin  Type 2 diabetes mellitus with hyperglycemia, without long-term current use of insulin (HCC) Blood sugar in 140s  Continue  [place], [place]. Advised to follow-up with PCP upon discharge as instructed.  Obesity BMI 32.2: Diet, exercise and weight reduction advised.     Consultants: Cardiology Procedures performed: Cardioversion  Disposition: Home Diet recommendation:  Discharge Diet Orders (From admission, onward)     Start     Ordered   08/16/23 0000  Diet - low sodium heart healthy        08/16/23 1418   08/16/23 0000  Diet Carb Modified        08/16/23 1418           Cardiac and Carb modified diet DISCHARGE MEDICATION: Allergies as of 08/16/2023       Reactions   Codeine Other (See Comments)   Unsure of reaction        Medication List     STOP taking these medications    lisinopril 5 MG tablet Commonly known as: ZESTRIL   OLANZapine 7.5 MG tablet Commonly known as: ZYPREXA   oxybutynin 5 MG tablet Commonly known as: DITROPAN   traZODone 50 MG tablet Commonly known as: DESYREL       TAKE these medications    Accu-Chek Guide test strip Generic drug: glucose blood Use to check blood sugars 2-3 times daily with goals = <130 fasting in morning and <180 two hours after eating. Bring blood sugar log to appointments.   amiodarone 200 MG tablet Commonly known as: PACERONE Take 2 tablets (400 mg total) by mouth 2 (two) times daily for 7 days, THEN 1 tablet (200 mg total) daily. Start taking on: August 16, 2023   apixaban 5 MG Tabs tablet Commonly known as: ELIQUIS Take 1 tablet (5 mg  [facility] Procedure: 2D Echo, Cardiac Doppler, Limited Echo and Limited Color Doppler Indications:     I42.9 Cradiomyopathy  History:         Patient has no prior history of Echocardiogram examinations.                  TEE-08/15/23; Risk Factors:Diabetes and Dyslipidemia.  Sonographer:     Taurino Figeroa RDCS Referring Phys:  [id number] [facility] Diagnosing Phys: Mellody Drown Alluri IMPRESSIONS  1. Left ventricular ejection fraction, by estimation, is 30 to 35%. The left ventricle has moderate to severely decreased function. The left ventricle demonstrates global hypokinesis.  2. Right ventricular systolic function is mildly reduced. The right ventricular size is normal.  3. Left atrial size was dilated.  4. The mitral valve is normal in structure. Mild to moderate mitral valve regurgitation.  5. The aortic valve is tricuspid. Aortic valve regurgitation is not visualized.  6. The inferior vena cava is dilated in size with <50% respiratory variability, suggesting right atrial pressure of 15 mmHg. FINDINGS  Left Ventricle: Left ventricular ejection fraction, by estimation, is 30 to 35%. The left ventricle has moderate to severely decreased function. The left ventricle demonstrates global hypokinesis. The left ventricular internal cavity size was normal in size. Right Ventricle: The right ventricular size is normal. No increase in right ventricular wall thickness. Right ventricular systolic function is mildly reduced. Left Atrium: Left atrial size was dilated. Pericardium: There is no evidence of pericardial effusion. Mitral Valve: The  mitral valve is normal in structure. Mild to moderate mitral valve regurgitation. Tricuspid Valve: The tricuspid valve is normal in structure. Tricuspid valve regurgitation is mild. Aortic Valve: The aortic valve is tricuspid. Aortic valve regurgitation is not visualized. Pulmonic Valve: The pulmonic valve was not well visualized. Aorta: The aortic root is normal in size and structure. Venous: The inferior vena cava is dilated in size with less than 50% respiratory variability, suggesting right atrial pressure of 15 mmHg. LEFT VENTRICLE PLAX 2D LVIDd:         5.60 cm   Diastology LVIDs:         3.90 cm   LV e' medial:  6.20 cm/s LV PW:         0.80 cm   LV e' lateral: 9.07 cm/s LV IVS:        0.80 cm LVOT diam:     2.00 cm LVOT Area:     3.14 cm  RIGHT VENTRICLE            IVC RV S prime:     9.14 cm/s  IVC diam: 2.20 cm LEFT ATRIUM         Index LA diam:    4.90 cm 2.29 cm/m   AORTA Ao Root diam: 3.40 cm TRICUSPID VALVE TR Peak grad:   23.4 mmHg TR Vmax:        242.00 cm/s  SHUNTS Systemic Diam: 2.00 cm Windell Norfolk Electronically signed by Windell Norfolk Signature Date/Time: 08/16/2023/8:17:58 AM    Final    ECHO TEE  Result Date: 08/15/2023    TRANSESOPHOGEAL ECHO REPORT   Patient Name:   Dillon [name] Date of Exam: 08/15/2023 Medical Rec #:  [id number]          Height:       69.0 in Accession #:    [id number]         Weight:       218.0 lb Date of Birth:  October 14, 1951  Physician Discharge Summary   Patient: Dillon [name] MRN: [id number] DOB: 02/26/1951  Admit date:     08/14/2023  Discharge date: 08/16/23  Discharge Physician: Dillon Duster   PCP: Marjie Skiff, NP   Recommendations at discharge:    PCP follow up in  1 week. Cardiology follow up as suggested.  Discharge Diagnoses: Principal Problem:   Atrial flutter with rapid ventricular response (HCC) Active Problems:   Type 2 diabetes mellitus with hyperglycemia, without long-term current use of insulin (HCC)   Hyperlipidemia associated with type 2 diabetes mellitus (HCC)   Severe recurrent major depression without psychotic features (HCC)  Resolved Problems:   * No resolved hospital problems. *  Hospital Course: Dillon [name] is a 72 y.o. male with medical history significant of depression, OCD, hypertension, hyperlipidemia, type 2 diabetes, obesity, recent diagnosis of atrial flutter admitted for atrial flutter with RVR    10/28: Cardiology consult. Plan for DCCV. 10/29: successful cardioversion, EF 20 to 25% on TEE  Assessment and Plan: * Atrial flutter with rapid ventricular response (HCC) Status post successful cardioversion, TEE showing EF of 20 to 25% 2D echo limited showed EF 30-35%.  Low EF possibly in the setting of A-fib. Cardiology advised amiodarone 400 mg bid for 7 days followed by 200mg  daily.  GDMT to be optimized as outpatient if his BP and renal function tolerates. Continue home dose metoprolol and Eliquis for anticoagulation. Due to risk of QT prolongation, I held his Olanzapine for now, advised to talk to primary psychiatry.  Severe recurrent major depression without psychotic features (HCC) Appears stable. Cont home regimen for now Held Olanzapine due to high risk of QT prolongation.   Hyperlipidemia Continue statin  Type 2 diabetes mellitus with hyperglycemia, without long-term current use of insulin (HCC) Blood sugar in 140s  Continue  [place], [place]. Advised to follow-up with PCP upon discharge as instructed.  Obesity BMI 32.2: Diet, exercise and weight reduction advised.     Consultants: Cardiology Procedures performed: Cardioversion  Disposition: Home Diet recommendation:  Discharge Diet Orders (From admission, onward)     Start     Ordered   08/16/23 0000  Diet - low sodium heart healthy        08/16/23 1418   08/16/23 0000  Diet Carb Modified        08/16/23 1418           Cardiac and Carb modified diet DISCHARGE MEDICATION: Allergies as of 08/16/2023       Reactions   Codeine Other (See Comments)   Unsure of reaction        Medication List     STOP taking these medications    lisinopril 5 MG tablet Commonly known as: ZESTRIL   OLANZapine 7.5 MG tablet Commonly known as: ZYPREXA   oxybutynin 5 MG tablet Commonly known as: DITROPAN   traZODone 50 MG tablet Commonly known as: DESYREL       TAKE these medications    Accu-Chek Guide test strip Generic drug: glucose blood Use to check blood sugars 2-3 times daily with goals = <130 fasting in morning and <180 two hours after eating. Bring blood sugar log to appointments.   amiodarone 200 MG tablet Commonly known as: PACERONE Take 2 tablets (400 mg total) by mouth 2 (two) times daily for 7 days, THEN 1 tablet (200 mg total) daily. Start taking on: August 16, 2023   apixaban 5 MG Tabs tablet Commonly known as: ELIQUIS Take 1 tablet (5 mg  [facility] Procedure: 2D Echo, Cardiac Doppler, Limited Echo and Limited Color Doppler Indications:     I42.9 Cradiomyopathy  History:         Patient has no prior history of Echocardiogram examinations.                  TEE-08/15/23; Risk Factors:Diabetes and Dyslipidemia.  Sonographer:     Taurino Figeroa RDCS Referring Phys:  [id number] [facility] Diagnosing Phys: Mellody Drown Alluri IMPRESSIONS  1. Left ventricular ejection fraction, by estimation, is 30 to 35%. The left ventricle has moderate to severely decreased function. The left ventricle demonstrates global hypokinesis.  2. Right ventricular systolic function is mildly reduced. The right ventricular size is normal.  3. Left atrial size was dilated.  4. The mitral valve is normal in structure. Mild to moderate mitral valve regurgitation.  5. The aortic valve is tricuspid. Aortic valve regurgitation is not visualized.  6. The inferior vena cava is dilated in size with <50% respiratory variability, suggesting right atrial pressure of 15 mmHg. FINDINGS  Left Ventricle: Left ventricular ejection fraction, by estimation, is 30 to 35%. The left ventricle has moderate to severely decreased function. The left ventricle demonstrates global hypokinesis. The left ventricular internal cavity size was normal in size. Right Ventricle: The right ventricular size is normal. No increase in right ventricular wall thickness. Right ventricular systolic function is mildly reduced. Left Atrium: Left atrial size was dilated. Pericardium: There is no evidence of pericardial effusion. Mitral Valve: The  mitral valve is normal in structure. Mild to moderate mitral valve regurgitation. Tricuspid Valve: The tricuspid valve is normal in structure. Tricuspid valve regurgitation is mild. Aortic Valve: The aortic valve is tricuspid. Aortic valve regurgitation is not visualized. Pulmonic Valve: The pulmonic valve was not well visualized. Aorta: The aortic root is normal in size and structure. Venous: The inferior vena cava is dilated in size with less than 50% respiratory variability, suggesting right atrial pressure of 15 mmHg. LEFT VENTRICLE PLAX 2D LVIDd:         5.60 cm   Diastology LVIDs:         3.90 cm   LV e' medial:  6.20 cm/s LV PW:         0.80 cm   LV e' lateral: 9.07 cm/s LV IVS:        0.80 cm LVOT diam:     2.00 cm LVOT Area:     3.14 cm  RIGHT VENTRICLE            IVC RV S prime:     9.14 cm/s  IVC diam: 2.20 cm LEFT ATRIUM         Index LA diam:    4.90 cm 2.29 cm/m   AORTA Ao Root diam: 3.40 cm TRICUSPID VALVE TR Peak grad:   23.4 mmHg TR Vmax:        242.00 cm/s  SHUNTS Systemic Diam: 2.00 cm Windell Norfolk Electronically signed by Windell Norfolk Signature Date/Time: 08/16/2023/8:17:58 AM    Final    ECHO TEE  Result Date: 08/15/2023    TRANSESOPHOGEAL ECHO REPORT   Patient Name:   Dillon [name] Date of Exam: 08/15/2023 Medical Rec #:  [id number]          Height:       69.0 in Accession #:    [id number]         Weight:       218.0 lb Date of Birth:  October 14, 1951

## 2023-08-16 NOTE — Plan of Care (Signed)
Problem: Fluid Volume: Goal: Hemodynamic stability will improve 08/16/2023 1452 by Sherre Scarlet, RN Outcome: Adequate for Discharge 08/16/2023 1122 by Sherre Scarlet, RN Outcome: Progressing   Problem: Clinical Measurements: Goal: Diagnostic test results will improve 08/16/2023 1452 by Sherre Scarlet, RN Outcome: Adequate for Discharge 08/16/2023 1122 by Sherre Scarlet, RN Outcome: Progressing Goal: Signs and symptoms of infection will decrease 08/16/2023 1452 by Sherre Scarlet, RN Outcome: Adequate for Discharge 08/16/2023 1122 by Sherre Scarlet, RN Outcome: Progressing   Problem: Respiratory: Goal: Ability to maintain adequate ventilation will improve 08/16/2023 1452 by Sherre Scarlet, RN Outcome: Adequate for Discharge 08/16/2023 1122 by Sherre Scarlet, RN Outcome: Progressing   Problem: Education: Goal: Ability to describe self-care measures that may prevent or decrease complications (Diabetes Survival Skills Education) will improve 08/16/2023 1452 by Sherre Scarlet, RN Outcome: Adequate for Discharge 08/16/2023 1122 by Sherre Scarlet, RN Outcome: Progressing Goal: Individualized Educational Video(s) 08/16/2023 1452 by Sherre Scarlet, RN Outcome: Adequate for Discharge 08/16/2023 1122 by Sherre Scarlet, RN Outcome: Progressing   Problem: Coping: Goal: Ability to adjust to condition or change in health will improve 08/16/2023 1452 by Sherre Scarlet, RN Outcome: Adequate for Discharge 08/16/2023 1122 by Sherre Scarlet, RN Outcome: Progressing   Problem: Fluid Volume: Goal: Ability to maintain a balanced intake and output will improve 08/16/2023 1452 by Sherre Scarlet, RN Outcome: Adequate for Discharge 08/16/2023 1122 by Sherre Scarlet, RN Outcome: Progressing   Problem: Health Behavior/Discharge Planning: Goal: Ability to identify and utilize available resources and services will improve 08/16/2023 1452 by Sherre Scarlet, RN Outcome:  Adequate for Discharge 08/16/2023 1122 by Sherre Scarlet, RN Outcome: Progressing Goal: Ability to manage health-related needs will improve 08/16/2023 1452 by Sherre Scarlet, RN Outcome: Adequate for Discharge 08/16/2023 1122 by Sherre Scarlet, RN Outcome: Progressing   Problem: Metabolic: Goal: Ability to maintain appropriate glucose levels will improve 08/16/2023 1452 by Sherre Scarlet, RN Outcome: Adequate for Discharge 08/16/2023 1122 by Sherre Scarlet, RN Outcome: Progressing   Problem: Nutritional: Goal: Maintenance of adequate nutrition will improve 08/16/2023 1452 by Sherre Scarlet, RN Outcome: Adequate for Discharge 08/16/2023 1122 by Sherre Scarlet, RN Outcome: Progressing Goal: Progress toward achieving an optimal weight will improve 08/16/2023 1452 by Sherre Scarlet, RN Outcome: Adequate for Discharge 08/16/2023 1122 by Sherre Scarlet, RN Outcome: Progressing   Problem: Skin Integrity: Goal: Risk for impaired skin integrity will decrease 08/16/2023 1452 by Sherre Scarlet, RN Outcome: Adequate for Discharge 08/16/2023 1122 by Sherre Scarlet, RN Outcome: Progressing   Problem: Tissue Perfusion: Goal: Adequacy of tissue perfusion will improve 08/16/2023 1452 by Sherre Scarlet, RN Outcome: Adequate for Discharge 08/16/2023 1122 by Sherre Scarlet, RN Outcome: Progressing   Problem: Education: Goal: Knowledge of General Education information will improve Description: Including pain rating scale, medication(s)/side effects and non-pharmacologic comfort measures 08/16/2023 1452 by Sherre Scarlet, RN Outcome: Adequate for Discharge 08/16/2023 1122 by Sherre Scarlet, RN Outcome: Progressing   Problem: Health Behavior/Discharge Planning: Goal: Ability to manage health-related needs will improve 08/16/2023 1452 by Sherre Scarlet, RN Outcome: Adequate for Discharge 08/16/2023 1122 by Sherre Scarlet, RN Outcome: Progressing   Problem: Clinical  Measurements: Goal: Ability to maintain clinical measurements within normal limits will improve 08/16/2023 1452 by Sherre Scarlet, RN Outcome: Adequate for Discharge 08/16/2023 1122 by Sherre Scarlet, RN Outcome: Progressing Goal: Will remain free from infection 08/16/2023 1452 by Sherre Scarlet, RN Outcome: Adequate for Discharge 08/16/2023 1122 by Sherre Scarlet, RN Outcome: Progressing Goal: Diagnostic test results will improve 08/16/2023 1452 by Sherre Scarlet, RN Outcome: Adequate for Discharge  08/16/2023 1122 by Sherre Scarlet, RN Outcome: Progressing Goal: Respiratory complications will improve 08/16/2023 1452 by Sherre Scarlet, RN Outcome: Adequate for Discharge 08/16/2023 1122 by Sherre Scarlet, RN Outcome: Progressing Goal: Cardiovascular complication will be avoided 08/16/2023 1452 by Sherre Scarlet, RN Outcome: Adequate for Discharge 08/16/2023 1122 by Sherre Scarlet, RN Outcome: Progressing   Problem: Activity: Goal: Risk for activity intolerance will decrease 08/16/2023 1452 by Sherre Scarlet, RN Outcome: Adequate for Discharge 08/16/2023 1122 by Sherre Scarlet, RN Outcome: Progressing   Problem: Nutrition: Goal: Adequate nutrition will be maintained 08/16/2023 1452 by Sherre Scarlet, RN Outcome: Adequate for Discharge 08/16/2023 1122 by Sherre Scarlet, RN Outcome: Progressing   Problem: Coping: Goal: Level of anxiety will decrease 08/16/2023 1452 by Sherre Scarlet, RN Outcome: Adequate for Discharge 08/16/2023 1122 by Sherre Scarlet, RN Outcome: Progressing   Problem: Elimination: Goal: Will not experience complications related to bowel motility 08/16/2023 1452 by Sherre Scarlet, RN Outcome: Adequate for Discharge 08/16/2023 1122 by Sherre Scarlet, RN Outcome: Progressing Goal: Will not experience complications related to urinary retention 08/16/2023 1452 by Sherre Scarlet, RN Outcome: Adequate for Discharge 08/16/2023 1122 by  Sherre Scarlet, RN Outcome: Progressing   Problem: Pain Management: Goal: General experience of comfort will improve 08/16/2023 1452 by Sherre Scarlet, RN Outcome: Adequate for Discharge 08/16/2023 1122 by Sherre Scarlet, RN Outcome: Progressing   Problem: Safety: Goal: Ability to remain free from injury will improve 08/16/2023 1452 by Sherre Scarlet, RN Outcome: Adequate for Discharge 08/16/2023 1122 by Sherre Scarlet, RN Outcome: Progressing   Problem: Skin Integrity: Goal: Risk for impaired skin integrity will decrease 08/16/2023 1452 by Sherre Scarlet, RN Outcome: Adequate for Discharge 08/16/2023 1122 by Sherre Scarlet, RN Outcome: Progressing

## 2023-08-16 NOTE — Plan of Care (Signed)

## 2023-08-16 NOTE — Progress Notes (Signed)
PT Cancellation Note  Patient Details Name: Dillon [name] MRN: [id number] DOB: September 30, 1951   Cancelled Treatment:    Reason Eval/Treat Not Completed: PT screened, no needs identified, will sign off. Pt demonstrates being at baseline with no LOB noted during standing from recliner and performing marches. Will plan to discontinue skilled PT at this time unless change of status.   Lewi Drost 08/16/2023, 1:28 PM

## 2023-08-16 NOTE — Progress Notes (Signed)
Birth:  January 21, 1951          BSA:          2.143 m Patient Age:    72 years           BP:           110/91 mmHg Patient Gender: M                  HR:           67 bpm. Exam Location:  [facility] Procedure: Transesophageal Echo, Cardiac Doppler and Color Doppler Indications:     Atrial Fibrillation  History:         Patient has no prior history of Echocardiogram examinations.                  Risk Factors:Dyslipidemia and Diabetes.  Sonographer:     Cristela Blue Referring Phys:  [id number] Kathryne Gin Diagnosing Phys: Windell Norfolk PROCEDURE: The transesophogeal probe was passed without difficulty through the esophogus of the patient. Sedation performed by different physician. The patient was monitored while under deep sedation. Anesthestetic sedation was provided intravenously by Anesthesiology: 120mg  of Propofol. Image quality was excellent. The patient's vital signs; including heart rate, blood pressure, and oxygen saturation; remained stable throughout the procedure. The patient developed no complications during the procedure.  A successful direct current cardioversion was performed at 200 joules.  IMPRESSIONS  1. Left ventricular ejection fraction, by estimation, is 20 to 25%. The left ventricle has severely decreased function. The left ventricular internal cavity size was mildly dilated.  2. Right ventricular systolic function is severely reduced. The  right ventricular size is mildly enlarged.  3. Left atrial size was dilated. No left atrial/left atrial appendage thrombus was detected.  4. Right atrial size was dilated.  5. The mitral valve is normal in structure. Mild to moderate mitral valve regurgitation.  6. The aortic valve is tricuspid. Aortic valve regurgitation is not visualized. FINDINGS  Left Ventricle: Left ventricular ejection fraction, by estimation, is 20 to 25%. The left ventricle has severely decreased function. The left ventricular internal cavity size was mildly dilated. Right Ventricle: The right ventricular size is mildly enlarged. No increase in right ventricular wall thickness. Right ventricular systolic function is severely reduced. Left Atrium: Left atrial size was dilated. No left atrial/left atrial appendage thrombus was detected. Right Atrium: Right atrial size was dilated. Pericardium: There is no evidence of pericardial effusion. Mitral Valve: The mitral valve is normal in structure. Mild to moderate mitral valve regurgitation. Tricuspid Valve: The tricuspid valve is normal in structure. Tricuspid valve regurgitation is trivial. Aortic Valve: The aortic valve is tricuspid. Aortic valve regurgitation is not visualized. Pulmonic Valve: The pulmonic valve was normal in structure. Pulmonic valve regurgitation is not visualized. Aorta: The aortic root is normal in size and structure. IAS/Shunts: No atrial level shunt detected by color flow Doppler. Additional Comments: Spectral Doppler performed. Windell Norfolk Electronically signed by Windell Norfolk Signature Date/Time: 08/15/2023/5:38:56 PM    Final    DG Chest Port 1 View  Result Date: 08/14/2023 CLINICAL DATA:  5107 Atrial fibrillation (HCC) 5107. EXAM: PORTABLE CHEST 1 VIEW COMPARISON:  01/13/2016. FINDINGS: There is probable left retrocardiac atelectasis/scarring. Bilateral lung fields are otherwise clear. No acute consolidation or lung collapse. Bilateral costophrenic angles  are clear. Normal cardio-mediastinal silhouette. No acute osseous abnormalities. The soft tissues are within normal limits. IMPRESSION: No acute cardiopulmonary abnormality. Electronically Signed   By: Timoteo Expose.D.  Birth:  January 21, 1951          BSA:          2.143 m Patient Age:    72 years           BP:           110/91 mmHg Patient Gender: M                  HR:           67 bpm. Exam Location:  [facility] Procedure: Transesophageal Echo, Cardiac Doppler and Color Doppler Indications:     Atrial Fibrillation  History:         Patient has no prior history of Echocardiogram examinations.                  Risk Factors:Dyslipidemia and Diabetes.  Sonographer:     Cristela Blue Referring Phys:  [id number] Kathryne Gin Diagnosing Phys: Windell Norfolk PROCEDURE: The transesophogeal probe was passed without difficulty through the esophogus of the patient. Sedation performed by different physician. The patient was monitored while under deep sedation. Anesthestetic sedation was provided intravenously by Anesthesiology: 120mg  of Propofol. Image quality was excellent. The patient's vital signs; including heart rate, blood pressure, and oxygen saturation; remained stable throughout the procedure. The patient developed no complications during the procedure.  A successful direct current cardioversion was performed at 200 joules.  IMPRESSIONS  1. Left ventricular ejection fraction, by estimation, is 20 to 25%. The left ventricle has severely decreased function. The left ventricular internal cavity size was mildly dilated.  2. Right ventricular systolic function is severely reduced. The  right ventricular size is mildly enlarged.  3. Left atrial size was dilated. No left atrial/left atrial appendage thrombus was detected.  4. Right atrial size was dilated.  5. The mitral valve is normal in structure. Mild to moderate mitral valve regurgitation.  6. The aortic valve is tricuspid. Aortic valve regurgitation is not visualized. FINDINGS  Left Ventricle: Left ventricular ejection fraction, by estimation, is 20 to 25%. The left ventricle has severely decreased function. The left ventricular internal cavity size was mildly dilated. Right Ventricle: The right ventricular size is mildly enlarged. No increase in right ventricular wall thickness. Right ventricular systolic function is severely reduced. Left Atrium: Left atrial size was dilated. No left atrial/left atrial appendage thrombus was detected. Right Atrium: Right atrial size was dilated. Pericardium: There is no evidence of pericardial effusion. Mitral Valve: The mitral valve is normal in structure. Mild to moderate mitral valve regurgitation. Tricuspid Valve: The tricuspid valve is normal in structure. Tricuspid valve regurgitation is trivial. Aortic Valve: The aortic valve is tricuspid. Aortic valve regurgitation is not visualized. Pulmonic Valve: The pulmonic valve was normal in structure. Pulmonic valve regurgitation is not visualized. Aorta: The aortic root is normal in size and structure. IAS/Shunts: No atrial level shunt detected by color flow Doppler. Additional Comments: Spectral Doppler performed. Windell Norfolk Electronically signed by Windell Norfolk Signature Date/Time: 08/15/2023/5:38:56 PM    Final    DG Chest Port 1 View  Result Date: 08/14/2023 CLINICAL DATA:  5107 Atrial fibrillation (HCC) 5107. EXAM: PORTABLE CHEST 1 VIEW COMPARISON:  01/13/2016. FINDINGS: There is probable left retrocardiac atelectasis/scarring. Bilateral lung fields are otherwise clear. No acute consolidation or lung collapse. Bilateral costophrenic angles  are clear. Normal cardio-mediastinal silhouette. No acute osseous abnormalities. The soft tissues are within normal limits. IMPRESSION: No acute cardiopulmonary abnormality. Electronically Signed   By: Timoteo Expose.D.  Birth:  January 21, 1951          BSA:          2.143 m Patient Age:    72 years           BP:           110/91 mmHg Patient Gender: M                  HR:           67 bpm. Exam Location:  [facility] Procedure: Transesophageal Echo, Cardiac Doppler and Color Doppler Indications:     Atrial Fibrillation  History:         Patient has no prior history of Echocardiogram examinations.                  Risk Factors:Dyslipidemia and Diabetes.  Sonographer:     Cristela Blue Referring Phys:  [id number] Kathryne Gin Diagnosing Phys: Windell Norfolk PROCEDURE: The transesophogeal probe was passed without difficulty through the esophogus of the patient. Sedation performed by different physician. The patient was monitored while under deep sedation. Anesthestetic sedation was provided intravenously by Anesthesiology: 120mg  of Propofol. Image quality was excellent. The patient's vital signs; including heart rate, blood pressure, and oxygen saturation; remained stable throughout the procedure. The patient developed no complications during the procedure.  A successful direct current cardioversion was performed at 200 joules.  IMPRESSIONS  1. Left ventricular ejection fraction, by estimation, is 20 to 25%. The left ventricle has severely decreased function. The left ventricular internal cavity size was mildly dilated.  2. Right ventricular systolic function is severely reduced. The  right ventricular size is mildly enlarged.  3. Left atrial size was dilated. No left atrial/left atrial appendage thrombus was detected.  4. Right atrial size was dilated.  5. The mitral valve is normal in structure. Mild to moderate mitral valve regurgitation.  6. The aortic valve is tricuspid. Aortic valve regurgitation is not visualized. FINDINGS  Left Ventricle: Left ventricular ejection fraction, by estimation, is 20 to 25%. The left ventricle has severely decreased function. The left ventricular internal cavity size was mildly dilated. Right Ventricle: The right ventricular size is mildly enlarged. No increase in right ventricular wall thickness. Right ventricular systolic function is severely reduced. Left Atrium: Left atrial size was dilated. No left atrial/left atrial appendage thrombus was detected. Right Atrium: Right atrial size was dilated. Pericardium: There is no evidence of pericardial effusion. Mitral Valve: The mitral valve is normal in structure. Mild to moderate mitral valve regurgitation. Tricuspid Valve: The tricuspid valve is normal in structure. Tricuspid valve regurgitation is trivial. Aortic Valve: The aortic valve is tricuspid. Aortic valve regurgitation is not visualized. Pulmonic Valve: The pulmonic valve was normal in structure. Pulmonic valve regurgitation is not visualized. Aorta: The aortic root is normal in size and structure. IAS/Shunts: No atrial level shunt detected by color flow Doppler. Additional Comments: Spectral Doppler performed. Windell Norfolk Electronically signed by Windell Norfolk Signature Date/Time: 08/15/2023/5:38:56 PM    Final    DG Chest Port 1 View  Result Date: 08/14/2023 CLINICAL DATA:  5107 Atrial fibrillation (HCC) 5107. EXAM: PORTABLE CHEST 1 VIEW COMPARISON:  01/13/2016. FINDINGS: There is probable left retrocardiac atelectasis/scarring. Bilateral lung fields are otherwise clear. No acute consolidation or lung collapse. Bilateral costophrenic angles  are clear. Normal cardio-mediastinal silhouette. No acute osseous abnormalities. The soft tissues are within normal limits. IMPRESSION: No acute cardiopulmonary abnormality. Electronically Signed   By: Timoteo Expose.D.  [facility] CARDIOLOGY PROGRESS NOTE       Patient ID: Dillon [name] MRN: [id number] DOB/AGE: Jan 16, 1951 72 y.o.  Admit date: 08/14/2023 Referring Physician Dr. Pilar Jarvis Primary Physician Marjie Skiff, NP  Primary Cardiologist Dr. Melton Alar Reason for Consultation atrial flutter RVR  HPI: Willies Ozaki is a 72 y.o. male  with a past medical history of hypertension, hyperlipidemia, and diabetes who presented to the ED on 08/14/2023 for tachycardia. EKG in the ED notable for atrial flutter with rate in the 140s. Cardiology was consulted for further evaluation.   Interval history: -Patient resting comfortably this morning. States he slept much better.  -Denies any chest pain or palpitations. Remains in NSR after cardioversion yesterday. BP remains stable.  -Denies any SOB, weaned to room air.  -Slight bump in Cr this AM and K low at 3.2.   Review of systems complete and found to be negative unless listed above    Past Medical History:  Diagnosis Date   Anomalies of urachus, congenital    BPH with urinary obstruction    Depression    Diabetes mellitus without complication (HCC)    History of kidney stones    Hydronephrosis    Hyperlipidemia    Hypernatremia    Hypokalemia    Kidney stone    Macular degeneration    Meningitis    spinal   OCD (obsessive compulsive disorder)    Protein-calorie malnutrition, severe (HCC)    Shingles    Thrombocytopenia (HCC)     Past Surgical History:  Procedure Laterality Date   CYSTOSCOPY W/ URETERAL STENT PLACEMENT Right 10/12/2022   Procedure: CYSTOSCOPY WITH RETROGRADE PYELOGRAM/URETERAL STENT PLACEMENT;  Surgeon: Riki Altes, MD;  Location: [facility] ORS;  Service: Urology;  Laterality: Right;   CYSTOSCOPY/URETEROSCOPY/HOLMIUM LASER/STENT PLACEMENT Right 12/13/2022   Procedure: CYSTOSCOPY/URETEROSCOPY/HOLMIUM LASER/STENT EXCHANGE;  Surgeon: Riki Altes, MD;  Location: [facility] ORS;  Service: Urology;  Laterality:  Right;   CYSTOSCOPY/URETEROSCOPY/HOLMIUM LASER/STENT PLACEMENT Right 12/27/2022   Procedure: CYSTOSCOPY/URETEROSCOPY/HOLMIUM LASER/STENT EXCHANGE;  Surgeon: Riki Altes, MD;  Location: [facility] ORS;  Service: Urology;  Laterality: Right;   KIDNEY STONE SURGERY      Medications Prior to Admission  Medication Sig Dispense Refill Last Dose   apixaban (ELIQUIS) 5 MG TABS tablet Take 1 tablet (5 mg total) by mouth 2 (two) times daily. 60 tablet 2 08/13/2023 at 2000   cyanocobalamin (VITAMIN B12) 1000 MCG tablet Take 1 tablet (1,000 mcg total) by mouth daily. 90 tablet 4    empagliflozin (JARDIANCE) 25 MG TABS tablet Take 1 tablet (25 mg total) by mouth daily before breakfast. 90 tablet 4 08/13/2023 at 0800   fluvoxaMINE (LUVOX) 100 MG tablet Take 1 tablet (100 mg total) by mouth 2 (two) times daily at 8 am and 4 pm. 180 tablet 0 08/13/2023 at 1800   glucose blood (ACCU-CHEK GUIDE) test strip Use to check blood sugars 2-3 times daily with goals = <130 fasting in morning and <180 two hours after eating. Bring blood sugar log to appointments. 100 each 12    lisinopril (ZESTRIL) 5 MG tablet Take 1 tablet (5 mg total) by mouth daily. 90 tablet 4 08/13/2023 at Unknown   metoprolol succinate (TOPROL-XL) 100 MG 24 hr tablet Take 100 mg by mouth daily. Take with or immediately following a meal.   08/13/2023 at Unknown   Multiple Vitamins-Minerals (ICAPS AREDS 2 PO) Take 1 tablet by mouth 2 (two) times daily.      OLANZapine (ZYPREXA) 7.5  Cancer Mother        bladder   Emphysema Father    Emphysema Sister    COPD Sister    Cancer Brother        prostate     Vitals:   08/15/23 1949 08/16/23 0032 08/16/23 0330 08/16/23 0732  BP: 113/76 110/73 110/77 109/73  Pulse: 86 81 79 86  Resp: 17 18 18 16   Temp: 97.8 F (36.6 C) 98.7 F (37.1 C) 98.5 F (36.9 C) 97.8 F (36.6 C)  TempSrc: Oral Oral Oral Oral  SpO2: 93% 94% 95% 92%  Weight:      Height:        PHYSICAL EXAM General: Well appearing male, well nourished, in no acute distress laying at slight incline resting comfortably in hospital bed. HEENT: Normocephalic and atraumatic. Neck: No JVD.  Lungs: Normal respiratory effort on room air.  Lungs CTAB. Heart: Irregular rhythm, fast rate. Normal S1 and S2 without gallops or murmurs.  Abdomen: Non-distended appearing.  Msk: Normal strength and tone for age. Extremities: Warm and well perfused. No clubbing, cyanosis. No edema.  Neuro: Alert and oriented X 3. Psych: Answers questions appropriately.   Labs: Basic Metabolic Panel: Recent Labs    08/14/23 1136 08/15/23 0438 08/16/23 0419  NA 142 141 139  K 4.5 3.8 3.2*  CL 112* 110 107  CO2 19* 20* 24  GLUCOSE 145*  161* 133*  BUN 23 25* 29*  CREATININE 1.04 1.03 1.32*  CALCIUM 9.6 9.7 9.0  MG 2.4  --   --    Liver Function Tests: Recent Labs    08/15/23 0438  AST 18  ALT 29  ALKPHOS 103  BILITOT 1.9*  PROT 6.8  ALBUMIN 4.2   No results for input(s): "LIPASE", "AMYLASE" in the last 72 hours. CBC: Recent Labs    08/15/23 0438 08/16/23 0419  WBC 13.4* 9.0  HGB 17.9* 15.4  HCT 53.5* 46.8  MCV 90.7 91.8  PLT 125* 106*   Cardiac Enzymes: Recent Labs    08/14/23 1335 08/14/23 1559  TROPONINIHS 8 9   BNP: No results for input(s): "BNP" in the last 72 hours. D-Dimer: No results for input(s): "DDIMER" in the last 72 hours. Hemoglobin A1C: No results for input(s): "HGBA1C" in the last 72 hours. Fasting Lipid Panel: No results for input(s): "CHOL", "HDL", "LDLCALC", "TRIG", "CHOLHDL", "LDLDIRECT" in the last 72 hours. Thyroid Function Tests: Recent Labs    08/14/23 1136  TSH 1.722   Anemia Panel: No results for input(s): "VITAMINB12", "FOLATE", "FERRITIN", "TIBC", "IRON", "RETICCTPCT" in the last 72 hours.   Radiology: ECHOCARDIOGRAM LIMITED  Result Date: 08/16/2023    ECHOCARDIOGRAM LIMITED REPORT   Patient Name:   Dillon [name] Date of Exam: 08/15/2023 Medical Rec #:  [id number]          Height:       69.0 in Accession #:    [id number]         Weight:       218.0 lb Date of Birth:  April 20, 1951          BSA:          2.143 m Patient Age:    72 years           BP:           116/97 mmHg Patient Gender: M                  HR:  [facility]INIC CARDIOLOGY PROGRESS NOTE       Patient ID: Dillon [name] MRN: [id number] DOB/AGE: Jan 16, 1951 72 y.o.  Admit date: 08/14/2023 Referring Physician Dr. Pilar Jarvis Primary Physician Marjie Skiff, NP  Primary Cardiologist Dr. Melton Alar Reason for Consultation atrial flutter RVR  HPI: Willies Ozaki is a 72 y.o. male  with a past medical history of hypertension, hyperlipidemia, and diabetes who presented to the ED on 08/14/2023 for tachycardia. EKG in the ED notable for atrial flutter with rate in the 140s. Cardiology was consulted for further evaluation.   Interval history: -Patient resting comfortably this morning. States he slept much better.  -Denies any chest pain or palpitations. Remains in NSR after cardioversion yesterday. BP remains stable.  -Denies any SOB, weaned to room air.  -Slight bump in Cr this AM and K low at 3.2.   Review of systems complete and found to be negative unless listed above    Past Medical History:  Diagnosis Date   Anomalies of urachus, congenital    BPH with urinary obstruction    Depression    Diabetes mellitus without complication (HCC)    History of kidney stones    Hydronephrosis    Hyperlipidemia    Hypernatremia    Hypokalemia    Kidney stone    Macular degeneration    Meningitis    spinal   OCD (obsessive compulsive disorder)    Protein-calorie malnutrition, severe (HCC)    Shingles    Thrombocytopenia (HCC)     Past Surgical History:  Procedure Laterality Date   CYSTOSCOPY W/ URETERAL STENT PLACEMENT Right 10/12/2022   Procedure: CYSTOSCOPY WITH RETROGRADE PYELOGRAM/URETERAL STENT PLACEMENT;  Surgeon: Riki Altes, MD;  Location: [facility] ORS;  Service: Urology;  Laterality: Right;   CYSTOSCOPY/URETEROSCOPY/HOLMIUM LASER/STENT PLACEMENT Right 12/13/2022   Procedure: CYSTOSCOPY/URETEROSCOPY/HOLMIUM LASER/STENT EXCHANGE;  Surgeon: Riki Altes, MD;  Location: [facility] ORS;  Service: Urology;  Laterality:  Right;   CYSTOSCOPY/URETEROSCOPY/HOLMIUM LASER/STENT PLACEMENT Right 12/27/2022   Procedure: CYSTOSCOPY/URETEROSCOPY/HOLMIUM LASER/STENT EXCHANGE;  Surgeon: Riki Altes, MD;  Location: [facility] ORS;  Service: Urology;  Laterality: Right;   KIDNEY STONE SURGERY      Medications Prior to Admission  Medication Sig Dispense Refill Last Dose   apixaban (ELIQUIS) 5 MG TABS tablet Take 1 tablet (5 mg total) by mouth 2 (two) times daily. 60 tablet 2 08/13/2023 at 2000   cyanocobalamin (VITAMIN B12) 1000 MCG tablet Take 1 tablet (1,000 mcg total) by mouth daily. 90 tablet 4    empagliflozin (JARDIANCE) 25 MG TABS tablet Take 1 tablet (25 mg total) by mouth daily before breakfast. 90 tablet 4 08/13/2023 at 0800   fluvoxaMINE (LUVOX) 100 MG tablet Take 1 tablet (100 mg total) by mouth 2 (two) times daily at 8 am and 4 pm. 180 tablet 0 08/13/2023 at 1800   glucose blood (ACCU-CHEK GUIDE) test strip Use to check blood sugars 2-3 times daily with goals = <130 fasting in morning and <180 two hours after eating. Bring blood sugar log to appointments. 100 each 12    lisinopril (ZESTRIL) 5 MG tablet Take 1 tablet (5 mg total) by mouth daily. 90 tablet 4 08/13/2023 at Unknown   metoprolol succinate (TOPROL-XL) 100 MG 24 hr tablet Take 100 mg by mouth daily. Take with or immediately following a meal.   08/13/2023 at Unknown   Multiple Vitamins-Minerals (ICAPS AREDS 2 PO) Take 1 tablet by mouth 2 (two) times daily.      OLANZapine (ZYPREXA) 7.5

## 2023-08-16 NOTE — Progress Notes (Signed)
Heart Failure Stewardship Pharmacy Note  PCP: Marjie Skiff, NP PCP-Cardiologist: None  HPI: Dillon [name] is a 72 y.o. male with depression, OCD, hypertension, hyperlipidemia, type 2 diabetes, obesity, recent diagnosis of atrial flutter  who presented with atrial flutter with RVR. Troponin on admission was normal. CXR with no evidence of congestion per report. Patient was started on amiodarone infusion and underwent a successful TEE cardioversion.  TEE this admission with LVEF of 20-25%, severely reduced RV, mild-moderate MR. TTE after DCCV showed LVEF of 30-35%, mildly reduced RV function.   Pertinent Lab Values: Creatinine  Date Value Ref Range Status  12/01/2013 1.17 0.60 - 1.30 mg/dL Final   Creatinine, Ser  Date Value Ref Range Status  08/16/2023 1.32 (H) 0.61 - 1.24 mg/dL Final   BUN  Date Value Ref Range Status  08/16/2023 29 (H) 8 - 23 mg/dL Final  16/07/9603 25 8 - 27 mg/dL Final  54/06/8118 16 7 - 18 mg/dL Final   Potassium  Date Value Ref Range Status  08/16/2023 3.2 (L) 3.5 - 5.1 mmol/L Final  12/01/2013 3.7 3.5 - 5.1 mmol/L Final   Sodium  Date Value Ref Range Status  08/16/2023 139 135 - 145 mmol/L Final  07/17/2023 141 134 - 144 mmol/L Final  12/01/2013 139 136 - 145 mmol/L Final   Magnesium  Date Value Ref Range Status  08/14/2023 2.4 1.7 - 2.4 mg/dL Final    Comment:    Performed at [facility], [address] [place], [place] [address]   HB A1C ([facility])  Date Value Ref Range Status  04/14/2023 7.2 (H) 4.8 - 5.6 % Final    Comment:             Prediabetes: 5.7 - 6.4          Diabetes: >6.4          Glycemic control for adults with diabetes: <7.0    Hgb A1c MFr Bld  Date Value Ref Range Status  07/17/2023 8.3 (H) 4.8 - 5.6 % Final    Comment:             Prediabetes: 5.7 - 6.4          Diabetes: >6.4          Glycemic control for adults with diabetes: <7.0    TSH  Date Value Ref Range Status  08/14/2023  1.722 0.350 - 4.500 uIU/mL Final    Comment:    Performed by a 3rd Generation assay with a functional sensitivity of <=0.01 uIU/mL. Performed at [facility], [address] [place], [place] [address]   01/13/2023 1.010 0.450 - 4.500 uIU/mL Final    Vital Signs: Admission weight: Temp:  [97.6 F (36.4 C)-98.7 F (37.1 C)] 97.8 F (36.6 C) (10/30 0732) Pulse Rate:  [67-136] 86 (10/30 0732) Cardiac Rhythm: Normal sinus rhythm (10/29 1953) Resp:  [16-40] 16 (10/30 0732) BP: (87-146)/(55-122) 109/73 (10/30 0732) SpO2:  [86 %-98 %] 92 % (10/30 0732) Weight:  [98.9 kg (218 lb 0.6 oz)] 98.9 kg (218 lb 0.6 oz) (10/29 1207)  Intake/Output Summary (Last 24 hours) at 08/16/2023 0835 Last data filed at 08/16/2023 0547 Gross per 24 hour  Intake 328.68 ml  Output 1900 ml  Net -1571.32 ml    Current Heart Failure Medications:  Loop diuretic: furosemide 20 mg PO three times weekly Beta-Blocker: metoprolol succinate 100 mg daily ACEI/ARB/ARNI: none MRA: none SGLT2i: none  Prior to admission Heart Failure Medications:  Loop diuretic: none Beta-Blocker: metoprolol succinate 100 mg daily ACEI/ARB/ARNI: lisinopril 5 mg daily MRA: none SGLT2i: Jardiance 25 mg daily  Assessment: 1. Acute systolic heart failure (LVEF prior to DCCV of 20-25%, improved to 30-35% after) with mildly reduced RV function, due to presumed NICM. NYHA class  symptoms.  -Symptoms: Reports significant improvement in shortness of breath with cardioversion. Has not walked much.   -Volume: Appears euvolemic today. Creatinine and BUN bumped. Suspect that patient will have less fluid accumulation in NSR and may not need scheduled diuretics long term -Hemodynamics: BP WNL. Pulse ~80s in sinus rhythm. -BB: Metoprolol currently at home dose. May need to reduce now that patient is in NSR pending heart rate. Agree with current dose. -ACEI/ARB/ARNI: Home lisinopril is held due to BP. Could consider transition to  losartan outpatient given less BP affect, especially at 12.5 mg dose. -MRA: Can consider adding spironolactone at low dose today for HF and hypokalemia -SGLT2i: On SGLT2i at home, however also with history of Enterococcus UTI and uretal stent. May not be ideal to restart.  Plan: 1) Medication changes recommended at this time: -Consider starting spironolactone 12.5 mg daily. If started, may need to use furosemide 20 mg prn as outpatient rather than scheduled +/- reduce scheduled potassium.  2) Patient assistance: -None  3) Education: - Patient has been educated on current HF medications and potential additions to HF medication regimen - Patient verbalizes understanding that over the next few months, these medication doses may change and more medications may be added to optimize HF regimen - Patient has been educated on basic disease state pathophysiology and goals of therapy  Medication Assistance / Insurance Benefits Check: Does the patient have prescription insurance?    Type of insurance plan:  Does the patient qualify for medication assistance through manufacturers or grants? Pending   Outpatient Pharmacy: Prior to admission outpatient pharmacy: [facility]     Please do not hesitate to reach out with questions or concerns,  Enos Fling, PharmD, CPP, BCPS Heart Failure Pharmacist  Phone - [phone] 08/16/2023 10:31 AM

## 2023-08-17 ENCOUNTER — Encounter: Payer: Self-pay | Admitting: Cardiology

## 2023-08-17 ENCOUNTER — Telehealth: Payer: Self-pay

## 2023-08-17 NOTE — Telephone Encounter (Signed)
This encounter was created in error - please disregard.

## 2023-08-17 NOTE — Transitions of Care (Post Inpatient/ED Visit) (Signed)
08/17/2023  Name: Dillon [name] MRN: [id number] DOB: 1950/12/05  Today's TOC FU Call Status: Today's TOC FU Call Status:: Successful TOC FU Call Completed TOC FU Call Complete Date: 08/17/23 Patient's Name and Date of Birth confirmed.  Transition Care Management Follow-up Telephone Call Date of Discharge: 08/16/23 Discharge Facility: [facility]) Type of Discharge: Inpatient Admission Primary Inpatient Discharge Diagnosis:: A-fib How have you been since you were released from the hospital?: Better Any questions or concerns?: No  Items Reviewed: Did you receive and understand the discharge instructions provided?: Yes Medications obtained,verified, and reconciled?: Yes (Medications Reviewed) Any new allergies since your discharge?: No Dietary orders reviewed?: NA Do you have support at home?: No  Medications Reviewed Today: Medications Reviewed Today     Reviewed by Redge Gainer, RN (Case Manager) on 08/17/23 at 1539  Med List Status: <None>   Medication Order Taking? Sig Documenting Provider Last Dose Status Informant  amiodarone (PACERONE) 200 MG tablet [id number]  Take 2 tablets (400 mg total) by mouth 2 (two) times daily for 7 days, THEN 1 tablet (200 mg total) daily. Marcelino Duster, MD  Active   apixaban (ELIQUIS) 5 MG TABS tablet [id number] No Take 1 tablet (5 mg total) by mouth 2 (two) times daily. Aura Dials T, NP 08/13/2023 2000 Active Pharmacy Records, Self  cyanocobalamin (VITAMIN B12) 1000 MCG tablet [id number] No Take 1 tablet (1,000 mcg total) by mouth daily. Marjie Skiff, NP Taking Active Self  empagliflozin (JARDIANCE) 25 MG TABS tablet [id number] No Take 1 tablet (25 mg total) by mouth daily before breakfast. Aura Dials T, NP 08/13/2023 0800 Active Pharmacy Records  fluvoxaMINE (LUVOX) 100 MG tablet [id number] No Take 1 tablet (100 mg total) by mouth 2 (two) times daily at 8 am and 4 pm. Aura Dials T, NP  08/13/2023 1800 Active Pharmacy Records, Family Member  furosemide (LASIX) 20 MG tablet [id number]  Take 1 tablet (20 mg total) by mouth 3 (three) times a week. Marcelino Duster, MD  Active   glucose blood (ACCU-CHEK GUIDE) test strip [id number] No Use to check blood sugars 2-3 times daily with goals = <130 fasting in morning and <180 two hours after eating. Bring blood sugar log to appointments. Aura Dials T, NP Taking Active Other  metoprolol succinate (TOPROL-XL) 100 MG 24 hr tablet [id number] No Take 100 mg by mouth daily. Take with or immediately following a meal. [provider] 08/13/2023 Unknown Active Pharmacy Records, Self  Multiple Vitamins-Minerals (ICAPS AREDS 2 PO) [id number] No Take 1 tablet by mouth 2 (two) times daily. [provider] Taking Active Self  rosuvastatin (CRESTOR) 10 MG tablet [id number] No Take 1 tablet (10 mg total) by mouth at bedtime. Marjie Skiff, NP Unknown Unknown Active Pharmacy Records, Self           Med Note Sharon Seller Aug 14, 2023  1:30 PM) Patient reports taking - last dispensed 02/2023 for 90d  sitaGLIPtin (JANUVIA) 100 MG tablet [id number] No Take 1 tablet (100 mg total) by mouth daily. Marjie Skiff, NP Unknown Unknown Active Pharmacy Records  tamsulosin (FLOMAX) 0.4 MG CAPS capsule [id number] No Take 1 capsule (0.4 mg total) by mouth daily after breakfast. Riki Altes, MD 08/13/2023 0800 Active Pharmacy Records  Vitamin D, Ergocalciferol, (DRISDOL) 1.25 MG (50000 UNIT) CAPS capsule [id number] No Take 1 capsule (50,000 Units total) by mouth every 7 (seven) days.  Patient taking differently: Take 50,000 Units by mouth every Sunday.  Marjie Skiff, NP Taking Active Pharmacy Records, Self            Home Care and Equipment/Supplies: Were Home Health Services Ordered?: NA Any new equipment or medical supplies ordered?: NA  Functional Questionnaire: Do you need assistance with bathing/showering or  dressing?: No Do you need assistance with meal preparation?: No Do you need assistance with eating?: No Do you have difficulty maintaining continence: No Do you need assistance with getting out of bed/getting out of a chair/moving?: No Do you have difficulty managing or taking your medications?: No  Follow up appointments reviewed: PCP Follow-up appointment confirmed?: NA Specialist Hospital Follow-up appointment confirmed?: Yes Date of Specialist follow-up appointment?: 08/24/23 Follow-Up Specialty Provider:: Hewitt Blade Do you need transportation to your follow-up appointment?: No Do you understand care options if your condition(s) worsen?: Yes-patient verbalized understanding  SDOH Interventions Today    Flowsheet Row Most Recent Value  SDOH Interventions   Food Insecurity Interventions Intervention Not Indicated  Transportation Interventions Intervention Not Indicated       Deidre Ala, RN RN Care Manager [facility] [phone]

## 2023-08-23 DIAGNOSIS — R000 Tachycardia, unspecified: Secondary | ICD-10-CM | POA: Diagnosis not present

## 2023-08-23 DIAGNOSIS — I10 Essential (primary) hypertension: Secondary | ICD-10-CM | POA: Diagnosis not present

## 2023-08-23 DIAGNOSIS — E782 Mixed hyperlipidemia: Secondary | ICD-10-CM | POA: Diagnosis not present

## 2023-08-23 DIAGNOSIS — I483 Typical atrial flutter: Secondary | ICD-10-CM | POA: Diagnosis not present

## 2023-08-24 ENCOUNTER — Other Ambulatory Visit
Admission: RE | Admit: 2023-08-24 | Discharge: 2023-08-24 | Disposition: A | Discharge status: Home / Self Care | Payer: Medicare HMO | Source: Ambulatory Visit | Attending: Family | Admitting: Family

## 2023-08-24 ENCOUNTER — Telehealth: Payer: Self-pay

## 2023-08-24 ENCOUNTER — Encounter: Payer: Self-pay | Admitting: Family

## 2023-08-24 ENCOUNTER — Encounter: Payer: Medicare HMO | Admitting: Family

## 2023-08-24 ENCOUNTER — Ambulatory Visit: Payer: Medicare HMO | Admitting: Family

## 2023-08-24 VITALS — BP 93/74 | HR 121 | Ht 69.0 in | Wt 210.0 lb

## 2023-08-24 DIAGNOSIS — E119 Type 2 diabetes mellitus without complications: Secondary | ICD-10-CM | POA: Diagnosis not present

## 2023-08-24 DIAGNOSIS — I5022 Chronic systolic (congestive) heart failure: Secondary | ICD-10-CM | POA: Diagnosis not present

## 2023-08-24 DIAGNOSIS — E1169 Type 2 diabetes mellitus with other specified complication: Secondary | ICD-10-CM

## 2023-08-24 DIAGNOSIS — I4892 Unspecified atrial flutter: Secondary | ICD-10-CM | POA: Diagnosis not present

## 2023-08-24 DIAGNOSIS — E785 Hyperlipidemia, unspecified: Secondary | ICD-10-CM | POA: Diagnosis not present

## 2023-08-24 DIAGNOSIS — F329 Major depressive disorder, single episode, unspecified: Secondary | ICD-10-CM | POA: Diagnosis not present

## 2023-08-24 DIAGNOSIS — I10 Essential (primary) hypertension: Secondary | ICD-10-CM | POA: Diagnosis not present

## 2023-08-24 DIAGNOSIS — R000 Tachycardia, unspecified: Secondary | ICD-10-CM | POA: Diagnosis not present

## 2023-08-24 LAB — BASIC METABOLIC PANEL
Anion gap: 8 (ref 5–15)
BUN: 23 mg/dL (ref 8–23)
CO2: 25 mmol/L (ref 22–32)
Calcium: 9.8 mg/dL (ref 8.9–10.3)
Chloride: 108 mmol/L (ref 98–111)
Creatinine, Ser: 1.31 mg/dL — ABNORMAL HIGH (ref 0.61–1.24)
GFR, Estimated: 59 mL/min — ABNORMAL LOW (ref >60)
Glucose, Bld: 149 mg/dL — ABNORMAL HIGH (ref 70–99)
Potassium: 4.2 mmol/L (ref 3.5–5.1)
Sodium: 141 mmol/L (ref 135–145)

## 2023-08-24 LAB — MAGNESIUM: Magnesium: 2.3 mg/dL (ref 1.7–2.4)

## 2023-08-24 NOTE — Progress Notes (Signed)
Advanced Heart Failure Clinic Note    Referring Physician: recent admission PCP: Marjie Skiff, NP (last seen 10/24) Cardiologist: Clotilde Dieter, MD (last seen 11/24; returns 01/25)   HPI:  Dillon [name] is a 72 y/o male with a history of atrial flutter (10/24), depression, HTN, diabetes (~2023), hyperlipidemia, spinal meningitis, OCD, thrombocytopenia and chronic heart failure. Has had a few mental health admissions.   Admitted 08/14/23 due to atrial flutter with RVR. Had intermittent shortness of breath and minimal orthopnea. HR 140's in the ED. Cardiology consulted and amiodarone drip started. Cardioverted successfully 08/15/23. Due to prolonged QT, olanzapine held.   Echo 08/15/23: EF 30-35% with mild LAE, mild/ moderate Dillon TEE 08/15/23: EF 20-25% with mild/ moderate Dillon  He presents today for his initial visit with a chief complaint of minimal shortness of breath with moderate exertion. Has associated minimal fatigue, occasional palpitations and pedal edema along with this. Denies chest pain, cough, abdominal distention, dizziness or difficulty sleeping. Prior to admission he was having to sit up to breathe but now he is sleeping on 1 pillow.  He wants to make sure that he understands his medications and directions on how to take everything because with his OCD, he's afraid that he's going to make a mistake if there's changes made that don't print out on an updated medication list. He does admit to being overwhelmed with everything because up until last year with his diabetes diagnosis, he wasn't taking many medications and now he has "a bunch".  With his OCD, his son says that he would like patient to have all his care in the [facility] system to make things easier on the patient.   Does having [facility] EP appointment tomorrow that he is planning on keeping.   Review of Systems: [y] = yes, [ ]  = no   General: Weight gain [ ] ; Weight loss [ ] ; Anorexia [ ] ; Fatigue Cove.Etienne ]; Fever [ ] ; Chills [  ]; Weakness [ ]   Cardiac: Chest pain/pressure [ ] ; Resting SOB [ ] ; Exertional SOB Cove.Etienne ]; Orthopnea [ ] ; Pedal Edema Cove.Etienne ]; Palpitations Cove.Etienne ]; Syncope [ ] ; Presyncope [ ] ; Paroxysmal nocturnal dyspnea[ ]   Pulmonary: Cough [ ] ; Wheezing[ ] ; Hemoptysis[ ] ; Sputum [ ] ; Snoring [ ]   GI: Vomiting[ ] ; Dysphagia[ ] ; Melena[ ] ; Hematochezia [ ] ; Heartburn[ ] ; Abdominal pain [ ] ; Constipation [ ] ; Diarrhea [ ] ; BRBPR [ ]   GU: Hematuria[ ] ; Dysuria [ ] ; Nocturia[ ]   Vascular: Pain in legs with walking [ ] ; Pain in feet with lying flat [ ] ; Non-healing sores [ ] ; Stroke [ ] ; TIA [ ] ; Slurred speech [ ] ;  Neuro: Headaches[ ] ; Vertigo[ ] ; Seizures[ ] ; Paresthesias[ ] ;Blurred vision [ ] ; Diplopia [ ] ; Vision changes [ ]   Ortho/Skin: Arthritis [ ] ; Joint pain [ ] ; Muscle pain [ ] ; Joint swelling [ ] ; Back Pain [ ] ; Rash [ ]   Psych: Depression[ ] ; Anxiety[y ]  Heme: Bleeding problems [ ] ; Clotting disorders [ ] ; Anemia [ ]   Endocrine: Diabetes Cove.Etienne ]; Thyroid dysfunction[ ]    Past Medical History:  Diagnosis Date   Anomalies of urachus, congenital    BPH with urinary obstruction    Depression    Diabetes mellitus without complication (HCC)    History of kidney stones    Hydronephrosis    Hyperlipidemia    Hypernatremia    Hypokalemia    Kidney stone    Macular degeneration    Meningitis    spinal  OCD (obsessive compulsive disorder)    Protein-calorie malnutrition, severe (HCC)    Shingles    Thrombocytopenia (HCC)     Current Outpatient Medications  Medication Sig Dispense Refill   amiodarone (PACERONE) 200 MG tablet Take 2 tablets (400 mg total) by mouth 2 (two) times daily for 7 days, THEN 1 tablet (200 mg total) daily. 58 tablet 0   apixaban (ELIQUIS) 5 MG TABS tablet Take 1 tablet (5 mg total) by mouth 2 (two) times daily. 60 tablet 2   cyanocobalamin (VITAMIN B12) 1000 MCG tablet Take 1 tablet (1,000 mcg total) by mouth daily. 90 tablet 4   empagliflozin (JARDIANCE) 25 MG TABS tablet Take 1  tablet (25 mg total) by mouth daily before breakfast. 90 tablet 4   fluvoxaMINE (LUVOX) 100 MG tablet Take 1 tablet (100 mg total) by mouth 2 (two) times daily at 8 am and 4 pm. 180 tablet 0   furosemide (LASIX) 20 MG tablet Take 1 tablet (20 mg total) by mouth 3 (three) times a week. 30 tablet 0   glucose blood (ACCU-CHEK GUIDE) test strip Use to check blood sugars 2-3 times daily with goals = <130 fasting in morning and <180 two hours after eating. Bring blood sugar log to appointments. 100 each 12   metoprolol succinate (TOPROL-XL) 100 MG 24 hr tablet Take 100 mg by mouth daily. Take with or immediately following a meal.     Multiple Vitamins-Minerals (ICAPS AREDS 2 PO) Take 1 tablet by mouth 2 (two) times daily.     rosuvastatin (CRESTOR) 10 MG tablet Take 1 tablet (10 mg total) by mouth at bedtime. 90 tablet 4   sitaGLIPtin (JANUVIA) 100 MG tablet Take 1 tablet (100 mg total) by mouth daily. 90 tablet 4   tamsulosin (FLOMAX) 0.4 MG CAPS capsule Take 1 capsule (0.4 mg total) by mouth daily after breakfast. 30 capsule 11   Vitamin D, Ergocalciferol, (DRISDOL) 1.25 MG (50000 UNIT) CAPS capsule Take 1 capsule (50,000 Units total) by mouth every 7 (seven) days. (Patient taking differently: Take 50,000 Units by mouth every Sunday.) 12 capsule 4   No current facility-administered medications for this visit.    Allergies  Allergen Reactions   Codeine Other (See Comments)    Unsure of reaction      Social History   Socioeconomic History   Marital status: Widowed    Spouse name: Not on file   Number of children: Not on file   Years of education: Not on file   Highest education level: GED or equivalent  Occupational History   Occupation: retired  Tobacco Use   Smoking status: Never   Smokeless tobacco: Never  Vaping Use   Vaping status: Never Used  Substance and Sexual Activity   Alcohol use: No    Alcohol/week: 0.0 standard drinks of alcohol   Drug use: No   Sexual activity: Not  Currently  Other Topics Concern   Not on file  Social History Narrative   Live alone and  have two children that are very supportive.   Social Determinants of Health   Financial Resource Strain: Low Risk  (02/28/2023)   Overall Financial Resource Strain (CARDIA)    Difficulty of Paying Living Expenses: Not hard at all  Food Insecurity: No Food Insecurity (08/17/2023)   Hunger Vital Sign    Worried About Running Out of Food in the Last Year: Never true    Ran Out of Food in the Last Year: Never true  Transportation Needs:  No Transportation Needs (08/17/2023)   PRAPARE - Administrator, Civil Service (Medical): No    Lack of Transportation (Non-Medical): No  Physical Activity: Insufficiently Active (02/28/2023)   Exercise Vital Sign    Days of Exercise per Week: 4 days    Minutes of Exercise per Session: 30 min  Stress: No Stress Concern Present (02/28/2023)   [name] of Occupational Health - Occupational Stress Questionnaire    Feeling of Stress : Not at all  Social Connections: Unknown (02/28/2023)   Social Connection and Isolation Panel [NHANES]    Frequency of Communication with Friends and Family: Twice a week    Frequency of Social Gatherings with Friends and Family: Not on file    Attends Religious Services: Never    Database administrator or Organizations: No    Attends Banker Meetings: Never    Marital Status: Widowed  Intimate Partner Violence: Not At Risk (08/14/2023)   Humiliation, Afraid, Rape, and Kick questionnaire    Fear of Current or Ex-Partner: No    Emotionally Abused: No    Physically Abused: No    Sexually Abused: No      Family History  Problem Relation Age of Onset   Cancer Mother        bladder   Emphysema Father    Emphysema Sister    COPD Sister    Cancer Brother        prostate   Vitals:   08/24/23 1005  BP: 93/74  Pulse: (!) 121  SpO2: 97%  Weight: 210 lb (95.3 kg)  Height: 5\' 9"  (1.753 m)   Wt  Readings from Last 3 Encounters:  08/24/23 210 lb (95.3 kg)  08/15/23 218 lb 0.6 oz (98.9 kg)  08/14/23 215 lb 12.8 oz (97.9 kg)   Lab Results  Component Value Date   CREATININE 1.32 (H) 08/16/2023   CREATININE 1.03 08/15/2023   CREATININE 1.04 08/14/2023   PHYSICAL EXAM: General:  Well appearing. No respiratory difficulty HEENT: normal Neck: supple. no JVD. No lymphadenopathy or thyromegaly appreciated. Cor: PMI nondisplaced. Tachycardia & regular rhythm. No rubs, gallops or murmurs. Lungs: clear Abdomen: soft, nontender, nondistended. No hepatosplenomegaly. No bruits or masses.  Extremities: no cyanosis, clubbing, rash, 1+ pitting edema right lower leg, trace pitting left lower leg Neuro: alert & oriented x 3, cranial nerves grossly intact. moves all 4 extremities w/o difficulty. Affect pleasant.  ECG: 08/15/23: NSR   ASSESSMENT & PLAN:  1: NICM with reduced ejection fraction- - suspect due to atrial flutter - NYHA class II - euvolemic - begin weighing daily & call for overnight weight gain of > 2 pounds or a weekly weight gain of > 5 pounds - Echo 08/15/23: EF 30-35% with mild LAE, mild/ moderate Dillon - TEE 08/15/23: EF 20-25% with mild/ moderate Dillon - continue jardiance 25mg  daily (diabetes dose) - continue metoprolol succinate 100mg  BID - continue furosemide 20mg  3 days/ week (M, W, F) - current BP precludes any other GDMT  - can wear compression socks or cut the bands on his socks - no BNP to report  2: Atrial flutter- - saw cardiology (Custovic) 11/24 - to see EP Maisie Fus) tomorrow for consideration of ablation - continue amiodarone 400mg  BID until tomorrow and then decrease it to 200mg  daily - continue apixaban 5mg  BID - BMET/ Mg today  3: HTN- - BP 93/74 - saw PCP [facility]) 10/24 - BMP 08/16/23 showed sodium 139, potassium 3.2, creatinine 1.32 & GFR  58  4: DM- - A1c 07/17/23 was 8.3% - continue jardiance 25mg  daily - continue januvia 100mg  daily  5:  Hyperlipidemia- - LDL 07/17/23 was 62 - continue rosuvastatin 10mg  daily  6: Depression/ OCD- - saw psychiatry (Hisada) 10/24 - continue fluvoxamine 100mg  BID   Return in 1 month, sooner if needed. Advised patient and son that whichever cardiology group he chooses to stay with is his decision.    Dillon Freeze, FNP 08/24/23

## 2023-08-24 NOTE — Patient Instructions (Addendum)
It was great to meet you both today!  Go over to the [facility]. Go pass the gift shop and have your blood work completed.  Please review your medication list on this AVS.  Begin weighing daily and call for an overnight weight gain of 3 pounds or more or a weekly weight gain of more than 5 pounds.

## 2023-08-24 NOTE — Telephone Encounter (Signed)
-----   Message from Delma Freeze sent at 08/24/2023 12:57 PM EST ----- Potassium now normal. Magnesium normal & kidney function stable. Continue medications at this time.

## 2023-08-25 DIAGNOSIS — I483 Typical atrial flutter: Secondary | ICD-10-CM | POA: Diagnosis not present

## 2023-09-11 DIAGNOSIS — E782 Mixed hyperlipidemia: Secondary | ICD-10-CM | POA: Diagnosis not present

## 2023-09-11 DIAGNOSIS — I4892 Unspecified atrial flutter: Secondary | ICD-10-CM | POA: Diagnosis not present

## 2023-09-11 DIAGNOSIS — I483 Typical atrial flutter: Secondary | ICD-10-CM | POA: Diagnosis not present

## 2023-09-11 DIAGNOSIS — I10 Essential (primary) hypertension: Secondary | ICD-10-CM | POA: Diagnosis not present

## 2023-09-11 DIAGNOSIS — R000 Tachycardia, unspecified: Secondary | ICD-10-CM | POA: Diagnosis not present

## 2023-09-27 ENCOUNTER — Encounter: Payer: Medicare HMO | Admitting: Family

## 2023-09-27 DIAGNOSIS — I4892 Unspecified atrial flutter: Secondary | ICD-10-CM | POA: Diagnosis not present

## 2023-09-27 DIAGNOSIS — R9431 Abnormal electrocardiogram [ECG] [EKG]: Secondary | ICD-10-CM | POA: Diagnosis not present

## 2023-09-27 DIAGNOSIS — I483 Typical atrial flutter: Secondary | ICD-10-CM | POA: Diagnosis not present

## 2023-09-27 DIAGNOSIS — I517 Cardiomegaly: Secondary | ICD-10-CM | POA: Diagnosis not present

## 2023-09-28 ENCOUNTER — Ambulatory Visit: Payer: Medicare HMO | Admitting: Urology

## 2023-09-29 ENCOUNTER — Ambulatory Visit: Payer: Medicare HMO | Admitting: Cardiovascular Disease
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
## 2023-10-02 ENCOUNTER — Other Ambulatory Visit: Payer: Self-pay | Admitting: Nurse Practitioner

## 2023-10-02 ENCOUNTER — Encounter: Payer: Medicare HMO | Admitting: Family

## 2023-10-02 ENCOUNTER — Encounter: Payer: Self-pay | Admitting: Psychiatry

## 2023-10-02 ENCOUNTER — Telehealth: Payer: Self-pay | Admitting: Family

## 2023-10-02 ENCOUNTER — Ambulatory Visit (INDEPENDENT_AMBULATORY_CARE_PROVIDER_SITE_OTHER): Payer: Medicare HMO | Admitting: Psychiatry

## 2023-10-02 VITALS — BP 130/81 | HR 67 | Temp 97.4°F | Ht 69.0 in | Wt 224.0 lb

## 2023-10-02 DIAGNOSIS — F429 Obsessive-compulsive disorder, unspecified: Secondary | ICD-10-CM

## 2023-10-02 DIAGNOSIS — G4700 Insomnia, unspecified: Secondary | ICD-10-CM | POA: Diagnosis not present

## 2023-10-02 DIAGNOSIS — F3342 Major depressive disorder, recurrent, in full remission: Secondary | ICD-10-CM

## 2023-10-02 NOTE — Patient Instructions (Signed)
Continue fluvoxamine 100 mg twice a day  Continue trazodone 50 mg at night as needed for insomnia Next appointment: 2/13 at 4:30

## 2023-10-02 NOTE — Telephone Encounter (Signed)
Patient did not show for his [facility] appointment on 10/02/23.

## 2023-10-03 NOTE — Telephone Encounter (Signed)
Requested Prescriptions  Pending Prescriptions Disp Refills   fluvoxaMINE (LUVOX) 100 MG tablet [Pharmacy Med Name: FLUVOXAMINE 100MG  TABLETS] 180 tablet 0    Sig: TAKE 1 TABLET(100 MG) BY MOUTH TWICE DAILY AT 8 AM AND AT 4 PM.     Psychiatry:  Antidepressants - SSRI Passed - 10/03/2023 12:05 PM      Passed - Completed PHQ-2 or PHQ-9 in the last 360 days      Passed - Valid encounter within last 6 months    Recent Outpatient Visits           1 month ago Atypical atrial flutter (HCC)   [facility] [place], [place] T, NP   2 months ago Type 2 diabetes mellitus with hyperglycemia, without long-term current use of insulin (HCC)   [facility] [place], [place] T, NP   5 months ago Type 2 diabetes mellitus with hyperglycemia, without long-term current use of insulin (HCC)   [facility] [place], [place] T, NP   6 months ago Type 2 diabetes mellitus with hyperglycemia, without long-term current use of insulin (HCC)   [facility] [place], [place] T, NP   8 months ago Type 2 diabetes mellitus with hyperglycemia, without long-term current use of insulin (HCC)   [facility], Dorie Rank, NP

## 2023-10-19 ENCOUNTER — Other Ambulatory Visit: Payer: Self-pay | Admitting: Nurse Practitioner

## 2023-10-23 NOTE — Telephone Encounter (Signed)
 Requested Prescriptions  Pending Prescriptions Disp Refills   ELIQUIS  5 MG TABS tablet [Pharmacy Med Name: ELIQUIS  5MG  TABLETS] 60 tablet 2    Sig: TAKE 1 TABLET(5 MG) BY MOUTH TWICE DAILY     Hematology:  Anticoagulants - apixaban  Failed - 10/23/2023 11:40 AM      Failed - PLT in normal range and within 360 days    Platelets  Date Value Ref Range Status  08/16/2023 106 (L) 150 - 400 K/uL Final  07/17/2023 141 (L) 150 - 450 x10E3/uL Final         Failed - Cr in normal range and within 360 days    Creatinine  Date Value Ref Range Status  12/01/2013 1.17 0.60 - 1.30 mg/dL Final   Creatinine, Ser  Date Value Ref Range Status  08/24/2023 1.31 (H) 0.61 - 1.24 mg/dL Final         Passed - HGB in normal range and within 360 days    Hemoglobin  Date Value Ref Range Status  08/16/2023 15.4 13.0 - 17.0 g/dL Final  90/69/7975 81.2 (H) 13.0 - 17.7 g/dL Final         Passed - HCT in normal range and within 360 days    HCT  Date Value Ref Range Status  08/16/2023 46.8 39.0 - 52.0 % Final   Hematocrit  Date Value Ref Range Status  07/17/2023 57.5 (H) 37.5 - 51.0 % Final         Passed - AST in normal range and within 360 days    AST  Date Value Ref Range Status  08/15/2023 18 15 - 41 U/L Final         Passed - ALT in normal range and within 360 days    ALT  Date Value Ref Range Status  08/15/2023 29 0 - 44 U/L Final         Passed - Valid encounter within last 12 months    Recent Outpatient Visits           2 months ago Atypical atrial flutter (HCC)   [facility] [place], [place] T, NP   3 months ago Type 2 diabetes mellitus with hyperglycemia, without long-term current use of insulin  (HCC)   [facility] [place], [place] T, NP   6 months ago Type 2 diabetes mellitus with hyperglycemia, without long-term current use of insulin  (HCC)   [facility] [place], [place] T, NP   7 months ago Type 2 diabetes  mellitus with hyperglycemia, without long-term current use of insulin  (HCC)   [facility] [place], Jolene T, NP   9 months ago Type 2 diabetes mellitus with hyperglycemia, without long-term current use of insulin  (HCC)    [facility] [place], Melanie DASEN, NP

## 2023-11-19 DIAGNOSIS — I5022 Chronic systolic (congestive) heart failure: Secondary | ICD-10-CM | POA: Insufficient documentation

## 2023-11-19 NOTE — Patient Instructions (Signed)

## 2023-11-21 ENCOUNTER — Encounter: Payer: Self-pay | Admitting: Nurse Practitioner

## 2023-11-21 ENCOUNTER — Ambulatory Visit (INDEPENDENT_AMBULATORY_CARE_PROVIDER_SITE_OTHER): Payer: Medicare HMO | Admitting: Nurse Practitioner

## 2023-11-21 VITALS — BP 133/74 | HR 65 | Temp 97.4°F | Ht 69.0 in | Wt 210.4 lb

## 2023-11-21 DIAGNOSIS — I484 Atypical atrial flutter: Secondary | ICD-10-CM | POA: Diagnosis not present

## 2023-11-21 DIAGNOSIS — E1165 Type 2 diabetes mellitus with hyperglycemia: Secondary | ICD-10-CM | POA: Diagnosis not present

## 2023-11-21 DIAGNOSIS — E538 Deficiency of other specified B group vitamins: Secondary | ICD-10-CM

## 2023-11-21 DIAGNOSIS — F422 Mixed obsessional thoughts and acts: Secondary | ICD-10-CM | POA: Diagnosis not present

## 2023-11-21 DIAGNOSIS — E785 Hyperlipidemia, unspecified: Secondary | ICD-10-CM | POA: Diagnosis not present

## 2023-11-21 DIAGNOSIS — E1169 Type 2 diabetes mellitus with other specified complication: Secondary | ICD-10-CM | POA: Diagnosis not present

## 2023-11-21 DIAGNOSIS — F332 Major depressive disorder, recurrent severe without psychotic features: Secondary | ICD-10-CM | POA: Diagnosis not present

## 2023-11-21 DIAGNOSIS — D696 Thrombocytopenia, unspecified: Secondary | ICD-10-CM

## 2023-11-21 DIAGNOSIS — I5022 Chronic systolic (congestive) heart failure: Secondary | ICD-10-CM

## 2023-11-21 DIAGNOSIS — E559 Vitamin D deficiency, unspecified: Secondary | ICD-10-CM

## 2023-11-21 DIAGNOSIS — Z7984 Long term (current) use of oral hypoglycemic drugs: Secondary | ICD-10-CM

## 2023-11-21 MED ORDER — EMPAGLIFLOZIN 25 MG PO TABS: 25.0000 mg | ORAL_TABLET | Freq: Every day | ORAL | 4 refills | Status: AC

## 2023-11-21 MED ORDER — SITAGLIPTIN PHOSPHATE 100 MG PO TABS: 100.0000 mg | ORAL_TABLET | Freq: Every day | ORAL | 4 refills | Status: DC

## 2023-11-21 MED ORDER — APIXABAN 5 MG PO TABS: 5.0000 mg | ORAL_TABLET | Freq: Two times a day (BID) | ORAL | 1 refills | Status: DC

## 2023-11-21 MED ORDER — ROSUVASTATIN CALCIUM 10 MG PO TABS: 10.0000 mg | ORAL_TABLET | Freq: Every day | ORAL | 4 refills | Status: AC

## 2023-11-21 NOTE — Assessment & Plan Note (Signed)
Chronic, ongoing.  Denies SI/HI.  Overall mood stable per patient.  Will continue current medication regimen and collaboration with psychiatry.  Obtain EKG as needed dependent on psychiatry recommendations, annual checks.

## 2023-11-21 NOTE — Assessment & Plan Note (Signed)
Chronic, ongoing.  Will continue Rosuvastatin 10 MG daily as is tolerating and monitor, adjust as needed.  Lipid panel and CMP today.

## 2023-11-21 NOTE — Assessment & Plan Note (Signed)
 Diagnosed December 2022, A1c 9.9% at the time.  A1c last visit was 8.3%, will recheck today and check urine ALB.  Did not tolerate Metformin  in past, severe diarrhea.  - Continue Jardiance  25 MG and Januvia  100 MG as they are offering benefit.  Could consider change to GLP1 in future, however unsure he will perform weekly injection, could do Rybelsus .  Recommend he check BS at home daily and document for next visit so provider can review.  Focus on diabetic diet.  LABS: CMP, A1c.  Urine ALB 150 March 2024.  Bring all pill bottles to next visit. - Refuses vaccines - Foot exam up to date and needs eye exam -- recommend he schedule ASAP - ACE and statin on board

## 2023-11-21 NOTE — Assessment & Plan Note (Signed)
Diagnosed on 07/17/23.  Following with cardiology with successful cardioversion on 08/15/23.  Continue collaboration with cardiology and current medication regimen as ordered.  Recommend he bring medications to all visits.

## 2023-11-21 NOTE — Assessment & Plan Note (Signed)
Ongoing.  Noted on labs, however had improved on recheck.  Will continue to monitor, check CBC today.

## 2023-11-21 NOTE — Assessment & Plan Note (Signed)
Chronic, ongoing.  Not consistently taking supplement.  Recommend he take daily as ordered.

## 2023-11-21 NOTE — Assessment & Plan Note (Signed)
 Refer to depression plan of care.

## 2023-11-21 NOTE — Progress Notes (Signed)
 BP 133/74   Pulse 65   Temp (!) 97.4 F (36.3 C) (Oral)   Ht 5' 9 (1.753 m)   Wt 210 lb 6.4 oz (95.4 kg)   SpO2 96%   BMI 31.07 kg/m    Subjective:    Patient ID: Dillon [name], male    DOB: 09/08/1951, 73 y.o.   MRN: [id number]  HPI: Dillon [name] is a 73 y.o. male  Chief Complaint  Patient presents with   Diabetes    No recent eye exam per patient   Hyperlipidemia   Hypertension   Depression   DIABETES Diagnosed in December 2022, A1c was 9.9% at the time. Last A1c 8.3% September.  Continues Jardiance  25 MG daily and Januvia  100 MG daily.  Metformin  caused issues with frequent diarrhea.  Is to be taking Vitamin B12 and D, not consistent with this. He reports trying to improve diet, more chicken and healthier options. Hypoglycemic episodes:no Polydipsia/polyuria: none Visual disturbance: no Chest pain: no Paresthesias: no Glucose Monitoring: yes  Accucheck frequency: occasional  Fasting glucose: 130 something last check  Post prandial:  Evening:  Before meals: Taking Insulin ?: no  Long acting insulin :  Short acting insulin : Blood Pressure Monitoring: not checking Retinal Examination: Not Up To Date -- macular degeneration, [facility] Foot Exam: Up to Date Pneumovax: Not up to Date - refuses vaccines Influenza: Not up to Date - refuses vaccines Aspirin: no   ATRIAL FLUTTER & HEART FAILURE Taking Eliquis  and Metoprolol  -- he is unsure if taking Amiodarone . Last EF on 08/15/23 was 20-25%.  Saw HF Clinic last on 08/24/23 and cardiology on 09/20/23. Atrial fibrillation status: stable Satisfied with current treatment: yes  Medication side effects:  no Medication compliance: good compliance Etiology of atrial fibrillation: unknown Palpitations:  no Chest pain:  no Dyspnea on exertion:   improved Orthopnea:  no -- sleeps on one pillow Syncope:  no Edema:  occasional Ventricular rate control: B-blocker Anti-coagulation: long acting   HYPERTENSION /  HYPERLIPIDEMIA Currently taking Lisinopril , Cardizem, Lasix , Metoprolol  + Crestor .  Low platelets on labs in past. Satisfied with current treatment? yes Duration of hypertension: chronic BP monitoring frequency: not checking BP range:  BP medication side effects: no Past BP meds:  Duration of hyperlipidemia: chronic Aspirin: no Recent stressors: no Recurrent headaches: no Visual changes: no Palpitations: no Dyspnea: improved with recent procedure Chest pain: no Lower extremity edema: no Dizzy/lightheaded: no   OCD Has visits with psychiatry, last on 10/02/23.  History of ECT treatments (March 2024).  Currently taking Fluvoxamine  + Trazodone .  Mood status: stable Satisfied with current treatment?: no Symptom severity: moderate Duration of current treatment : chronic Side effects: no Medication compliance: good compliance Psychotherapy/counseling: yes in the past Depressed mood: no Anxious mood: occasional Anhedonia:no Significant weight loss or gain: no Insomnia: varies on occasion Fatigue: no Feelings of worthlessness or guilt: no Impaired concentration/indecisiveness: no Suicidal ideations: no Hopelessness: no Crying spells: no    11/21/2023    9:50 AM 08/14/2023   10:02 AM 07/17/2023    9:46 AM 04/14/2023   10:15 AM 02/28/2023   11:27 AM  Depression screen PHQ 2/9  Decreased Interest 0 0 1 0 0  Down, Depressed, Hopeless 0 0 0 0 0  PHQ - 2 Score 0 0 1 0 0  Altered sleeping 0 0 0 0 0  Tired, decreased energy 0 0 0 0 0  Change in appetite 0 0 0 0 0  Feeling bad or failure about  yourself  0 0 0 0 0  Trouble concentrating 0 0 0 0 0  Moving slowly or fidgety/restless 0 0 0 0 0  Suicidal thoughts 0 0 0 0 0  PHQ-9 Score 0 0 1 0 0  Difficult doing work/chores Not difficult at all  Not difficult at all Not difficult at all Not difficult at all       11/21/2023    9:50 AM 08/14/2023   10:02 AM 07/17/2023    9:46 AM 04/14/2023   10:15 AM  GAD 7 : Generalized Anxiety  Score  Nervous, Anxious, on Edge 1 0 0 0  Control/stop worrying 0 0 0 0  Worry too much - different things 0 0 0 0  Trouble relaxing 0 0 0 0  Restless 0 0 0 0  Easily annoyed or irritable 0 0 0 0  Afraid - awful might happen 0 0 0 0  Total GAD 7 Score 1 0 0 0  Anxiety Difficulty Not difficult at all  Not difficult at all Not difficult at all   Relevant past medical, surgical, family and social history reviewed and updated as indicated. Interim medical history since our last visit reviewed. Allergies and medications reviewed and updated.  Review of Systems  Constitutional:  Negative for activity change, diaphoresis, fatigue and fever.  Respiratory:  Negative for cough, chest tightness, shortness of breath and wheezing.   Cardiovascular:  Negative for chest pain, palpitations and leg swelling.  Gastrointestinal: Negative.   Neurological: Negative.   Psychiatric/Behavioral:  Negative for decreased concentration, self-injury, sleep disturbance and suicidal ideas. The patient is not nervous/anxious.     Per HPI unless specifically indicated above     Objective:    BP 133/74   Pulse 65   Temp (!) 97.4 F (36.3 C) (Oral)   Ht 5' 9 (1.753 m)   Wt 210 lb 6.4 oz (95.4 kg)   SpO2 96%   BMI 31.07 kg/m   Wt Readings from Last 3 Encounters:  11/21/23 210 lb 6.4 oz (95.4 kg)  08/24/23 210 lb (95.3 kg)  08/15/23 218 lb 0.6 oz (98.9 kg)    Physical Exam Vitals and nursing note reviewed.  Constitutional:      General: He is awake. He is not in acute distress.    Appearance: He is well-developed and well-groomed. He is obese. He is not ill-appearing or toxic-appearing.  HENT:     Head: Normocephalic.     Right Ear: Hearing and external ear normal.     Left Ear: Hearing and external ear normal.  Eyes:     General: Lids are normal.     Extraocular Movements: Extraocular movements intact.     Conjunctiva/sclera: Conjunctivae normal.  Neck:     Thyroid : No thyromegaly.      Vascular: No carotid bruit.  Cardiovascular:     Rate and Rhythm: Normal rate and regular rhythm.     Heart sounds: Normal heart sounds. No murmur heard.    No gallop.  Pulmonary:     Effort: No accessory muscle usage or respiratory distress.     Breath sounds: Normal breath sounds.  Abdominal:     General: Bowel sounds are normal. There is no distension.     Palpations: Abdomen is soft.     Tenderness: There is no abdominal tenderness.  Musculoskeletal:     Cervical back: Full passive range of motion without pain.     Right lower leg: No edema.     Left  lower leg: No edema.  Lymphadenopathy:     Cervical: No cervical adenopathy.  Skin:    General: Skin is warm.     Capillary Refill: Capillary refill takes less than 2 seconds.  Neurological:     Mental Status: He is alert and oriented to person, place, and time.     Deep Tendon Reflexes: Reflexes are normal and symmetric.     Reflex Scores:      Brachioradialis reflexes are 2+ on the right side and 2+ on the left side.      Patellar reflexes are 2+ on the right side and 2+ on the left side. Psychiatric:        Attention and Perception: Attention normal.        Mood and Affect: Mood normal.        Speech: Speech normal.        Behavior: Behavior normal. Behavior is cooperative.        Thought Content: Thought content normal.       04/14/2023   10:39 AM 02/28/2023   11:34 AM 02/10/2022   11:05 AM 02/08/2021   11:24 AM  6CIT Screen  What Year? 0 points 0 points 0 points 0 points  What month? 0 points 0 points 0 points 0 points  What time? 0 points 0 points 0 points 0 points  Count back from 20 0 points 0 points 0 points 0 points  Months in reverse 0 points 2 points 2 points 0 points  Repeat phrase 0 points 0 points 4 points 4 points  Total Score 0 points 2 points 6 points 4 points   Results for orders placed or performed during the hospital encounter of 08/24/23  Magnesium    Collection Time: 08/24/23 11:34 AM  Result  Value Ref Range   Magnesium  2.3 1.7 - 2.4 mg/dL  Basic metabolic panel   Collection Time: 08/24/23 11:34 AM  Result Value Ref Range   Sodium 141 135 - 145 mmol/L   Potassium 4.2 3.5 - 5.1 mmol/L   Chloride 108 98 - 111 mmol/L   CO2 25 22 - 32 mmol/L   Glucose, Bld 149 (H) 70 - 99 mg/dL   BUN 23 8 - 23 mg/dL   Creatinine, Ser 8.68 (H) 0.61 - 1.24 mg/dL   Calcium  9.8 8.9 - 10.3 mg/dL   GFR, Estimated 59 (L) >60 mL/min   Anion gap 8 5 - 15      Assessment & Plan:   Problem List Items Addressed This Visit       Cardiovascular and Mediastinum   Atrial flutter (HCC)   Diagnosed on 07/17/23.  Following with cardiology with successful cardioversion on 08/15/23.  Continue collaboration with cardiology and current medication regimen as ordered.  Recommend he bring medications to all visits.      Relevant Medications   diltiazem (CARDIZEM) 30 MG tablet   lisinopril  (ZESTRIL ) 5 MG tablet   apixaban  (ELIQUIS ) 5 MG TABS tablet   rosuvastatin  (CRESTOR ) 10 MG tablet   Other Relevant Orders   Comprehensive metabolic panel   Lipid Panel w/o Chol/HDL Ratio   Chronic systolic heart failure (HCC)   Ongoing, followed by cardiology and HF clinic.  Suspect related to Atrial Flutter which was diagnosed in October 2024.  Continue current medication regimen and adjust as needed.  Euvolemic today. - Reminded to call for an overnight weight gain of >2 pounds or a weekly weight gain of >5 pounds - not adding salt to food and read  food labels. Reviewed the importance of keeping daily sodium intake to 2000mg  daily.  - Avoid Ibuprofen products      Relevant Medications   diltiazem (CARDIZEM) 30 MG tablet   lisinopril  (ZESTRIL ) 5 MG tablet   apixaban  (ELIQUIS ) 5 MG TABS tablet   rosuvastatin  (CRESTOR ) 10 MG tablet   Other Relevant Orders   Comprehensive metabolic panel   Lipid Panel w/o Chol/HDL Ratio     Endocrine   Hyperlipidemia associated with type 2 diabetes mellitus (HCC)   Chronic, ongoing.   Will continue Rosuvastatin  10 MG daily as is tolerating and monitor, adjust as needed.  Lipid panel and CMP today.      Relevant Medications   diltiazem (CARDIZEM) 30 MG tablet   lisinopril  (ZESTRIL ) 5 MG tablet   apixaban  (ELIQUIS ) 5 MG TABS tablet   empagliflozin  (JARDIANCE ) 25 MG TABS tablet   rosuvastatin  (CRESTOR ) 10 MG tablet   sitaGLIPtin  (JANUVIA ) 100 MG tablet   Other Relevant Orders   Comprehensive metabolic panel   Lipid Panel w/o Chol/HDL Ratio   HgB A1c   Type 2 diabetes mellitus with hyperglycemia, without long-term current use of insulin  [facility]) - Primary   Diagnosed December 2022, A1c 9.9% at the time.  A1c last visit was 8.3%, will recheck today and check urine ALB.  Did not tolerate Metformin  in past, severe diarrhea.  - Continue Jardiance  25 MG and Januvia  100 MG as they are offering benefit.  Could consider change to GLP1 in future, however unsure he will perform weekly injection, could do Rybelsus .  Recommend he check BS at home daily and document for next visit so provider can review.  Focus on diabetic diet.  LABS: CMP, A1c.  Urine ALB 150 March 2024.  Bring all pill bottles to next visit. - Refuses vaccines - Foot exam up to date and needs eye exam -- recommend he schedule ASAP - ACE and statin on board      Relevant Medications   lisinopril  (ZESTRIL ) 5 MG tablet   empagliflozin  (JARDIANCE ) 25 MG TABS tablet   rosuvastatin  (CRESTOR ) 10 MG tablet   sitaGLIPtin  (JANUVIA ) 100 MG tablet   Other Relevant Orders   HgB A1c   Urine Microalbumin w/creat. ratio     Hematopoietic and Hemostatic   Thrombocytopenia (HCC)   Ongoing.  Noted on labs, however had improved on recheck.  Will continue to monitor, check CBC today.      Relevant Orders   CBC with Differential/Platelet     Other   OCD (obsessive compulsive disorder)   Refer to depression plan of care.      Severe recurrent major depression without psychotic features (HCC)   Chronic, ongoing.  Denies  SI/HI.  Overall mood stable per patient.  Will continue current medication regimen and collaboration with psychiatry.  Obtain EKG as needed dependent on psychiatry recommendations, annual checks.      Vitamin B12 deficiency   Chronic, ongoing.  Not consistently taking supplement.  Recommend he take daily as ordered.      Relevant Orders   Vitamin B12   Vitamin D  deficiency   Chronic, ongoing.  Not consistently taking supplement.  Recommend he take weekly as ordered.      Relevant Orders   VITAMIN D  25 Hydroxy (Vit-D Deficiency, Fractures)     Follow up plan: Return in about 3 months (around 02/18/2024) for T2DM, HTN/HLD, A Flutter, Mood.

## 2023-11-21 NOTE — Assessment & Plan Note (Addendum)
Chronic, ongoing.  Not consistently taking supplement.  Recommend he take weekly as ordered.

## 2023-11-21 NOTE — Assessment & Plan Note (Addendum)
 Ongoing, followed by cardiology and HF clinic.  Suspect related to Atrial Flutter which was diagnosed in October 2024.  Continue current medication regimen and adjust as needed.  Euvolemic today. - Reminded to call for an overnight weight gain of >2 pounds or a weekly weight gain of >5 pounds - not adding salt to food and read food labels. Reviewed the importance of keeping daily sodium intake to 2000mg  daily.  - Avoid Ibuprofen products

## 2023-11-22 LAB — COMPREHENSIVE METABOLIC PANEL
ALT: 33 [IU]/L (ref 0–44)
AST: 18 [IU]/L (ref 0–40)
Albumin: 4.4 g/dL (ref 3.9–4.9)
Alkaline Phosphatase: 151 [IU]/L — ABNORMAL HIGH (ref 44–121)
BUN/Creatinine Ratio: 19 (ref 10–24)
BUN: 21 mg/dL (ref 8–27)
Bilirubin Total: 0.7 mg/dL (ref 0.0–1.2)
CO2: 20 mmol/L (ref 20–29)
Calcium: 9.9 mg/dL (ref 8.6–10.2)
Chloride: 107 mmol/L — ABNORMAL HIGH (ref 96–106)
Creatinine, Ser: 1.11 mg/dL (ref 0.76–1.27)
Globulin, Total: 2.1 g/dL (ref 1.5–4.5)
Glucose: 176 mg/dL — ABNORMAL HIGH (ref 70–99)
Potassium: 4.1 mmol/L (ref 3.5–5.2)
Sodium: 145 mmol/L — ABNORMAL HIGH (ref 134–144)
Total Protein: 6.5 g/dL (ref 6.0–8.5)
eGFR: 71 mL/min/{1.73_m2} (ref >59)

## 2023-11-22 LAB — HEMOGLOBIN A1C
Est. average glucose Bld gHb Est-mCnc: 154 mg/dL
Hgb A1c MFr Bld: 7 % — ABNORMAL HIGH (ref 4.8–5.6)

## 2023-11-22 LAB — CBC WITH DIFFERENTIAL/PLATELET
Basophils Absolute: 0 10*3/uL (ref 0.0–0.2)
Basos: 0 %
EOS (ABSOLUTE): 0.1 10*3/uL (ref 0.0–0.4)
Eos: 1 %
Hematocrit: 54.5 % — ABNORMAL HIGH (ref 37.5–51.0)
Hemoglobin: 18 g/dL — ABNORMAL HIGH (ref 13.0–17.7)
Immature Grans (Abs): 0 10*3/uL (ref 0.0–0.1)
Immature Granulocytes: 0 %
Lymphocytes Absolute: 2.6 10*3/uL (ref 0.7–3.1)
Lymphs: 30 %
MCH: 30.1 pg (ref 26.6–33.0)
MCHC: 33 g/dL (ref 31.5–35.7)
MCV: 91 fL (ref 79–97)
Monocytes Absolute: 0.8 10*3/uL (ref 0.1–0.9)
Monocytes: 9 %
Neutrophils Absolute: 5.2 10*3/uL (ref 1.4–7.0)
Neutrophils: 60 %
Platelets: 112 10*3/uL — ABNORMAL LOW (ref 150–450)
RBC: 5.99 x10E6/uL — ABNORMAL HIGH (ref 4.14–5.80)
RDW: 14.7 % (ref 11.6–15.4)
WBC: 8.8 10*3/uL (ref 3.4–10.8)

## 2023-11-22 LAB — VITAMIN D 25 HYDROXY (VIT D DEFICIENCY, FRACTURES): Vit D, 25-Hydroxy: 43.3 ng/mL (ref 30.0–100.0)

## 2023-11-22 LAB — LIPID PANEL W/O CHOL/HDL RATIO
Cholesterol, Total: 118 mg/dL (ref 100–199)
HDL: 46 mg/dL (ref >39)
LDL Chol Calc (NIH): 55 mg/dL (ref 0–99)
Triglycerides: 90 mg/dL (ref 0–149)
VLDL Cholesterol Cal: 17 mg/dL (ref 5–40)

## 2023-11-22 LAB — VITAMIN B12: Vitamin B-12: 579 pg/mL (ref 232–1245)

## 2023-11-22 NOTE — Progress Notes (Signed)
 Good morning amazing crew, please let Dillon [name] know his labs have returned: - Kidney and liver function are normal - Sodium, salt, level is a little high.  Please cut back on salt and drink a little more water.  We will recheck next visit. - CBC, shows mild elevation in hemoglobin and hematocrit + platelets remain on lower side.  We will continue to monitor these closely. - Remainder of labs stable.  No medication changes needed.  Any questions? Keep being amazing!!  Thank you for allowing me to participate in your care.  I appreciate you. Kindest regards, Torsha Lemus

## 2023-11-23 LAB — MICROALBUMIN / CREATININE URINE RATIO
Creatinine, Urine: 69.7 mg/dL
Microalb/Creat Ratio: 23 mg/g{creat} (ref 0–29)
Microalbumin, Urine: 15.8 ug/mL

## 2023-12-05 ENCOUNTER — Telehealth: Payer: Self-pay

## 2023-12-05 NOTE — Telephone Encounter (Signed)
Patient was identified as falling into the [place] Measure - Diabetes.   Patient was: Patient is already scheduled for follow up visit 02/23/2024.

## 2023-12-18 ENCOUNTER — Other Ambulatory Visit: Payer: Self-pay | Admitting: Nurse Practitioner

## 2023-12-19 NOTE — Telephone Encounter (Signed)
 Requested Prescriptions  Pending Prescriptions Disp Refills   sitaGLIPtin (JANUVIA) 100 MG tablet [Pharmacy Med Name: JANUVIA 100MG  TABLETS] 90 tablet 0    Sig: TAKE 1 TABLET(100 MG) BY MOUTH DAILY     Endocrinology:  Diabetes - DPP-4 Inhibitors Passed - 12/19/2023  2:49 PM      Passed - HBA1C is between 0 and 7.9 and within 180 days    HB A1C (BAYER DCA - WAIVED)  Date Value Ref Range Status  04/14/2023 7.2 (H) 4.8 - 5.6 % Final    Comment:             Prediabetes: 5.7 - 6.4          Diabetes: >6.4          Glycemic control for adults with diabetes: <7.0    Hgb A1c MFr Bld  Date Value Ref Range Status  11/21/2023 7.0 (H) 4.8 - 5.6 % Final    Comment:             Prediabetes: 5.7 - 6.4          Diabetes: >6.4          Glycemic control for adults with diabetes: <7.0          Passed - Cr in normal range and within 360 days    Creatinine  Date Value Ref Range Status  12/01/2013 1.17 0.60 - 1.30 mg/dL Final   Creatinine, Ser  Date Value Ref Range Status  11/21/2023 1.11 0.76 - 1.27 mg/dL Final         Passed - Valid encounter within last 6 months    Recent Outpatient Visits           4 months ago Atypical atrial flutter (HCC)   [place] [facility] [place], [place] T, NP   5 months ago Type 2 diabetes mellitus with hyperglycemia, without long-term current use of insulin (HCC)   [facility] [place], [place] T, NP   8 months ago Type 2 diabetes mellitus with hyperglycemia, without long-term current use of insulin (HCC)   [place] [facility] [place], [place] T, NP   9 months ago Type 2 diabetes mellitus with hyperglycemia, without long-term current use of insulin (HCC)   [place] [facility] [place], [place] T, NP   11 months ago Type 2 diabetes mellitus with hyperglycemia, without long-term current use of insulin (HCC)   [place] [facility] [place], Dorie Rank, NP       Future  Appointments             In 2 months [place], Dorie Rank, NP [place] [facility]

## 2024-01-17 ENCOUNTER — Telehealth: Payer: Self-pay | Admitting: Nurse Practitioner

## 2024-01-17 NOTE — Telephone Encounter (Signed)
 Copied from CRM [id number]. Topic: General - Billing Inquiry >> Jan 17, 2024 10:22 AM Higinio Roger wrote: Reason for CRM: Patient has a billing issue resulting in $260  Callback #: [id number] >> Jan 17, 2024 10:42 AM Geroge Baseman wrote: Patient states that he has spoken to filling and he does not believe he should have this charge, he states he got a letter stating that if it is not resolved in 30 days he will have to pay the bill. The letter states there is a denial. He needs someone to give him a call back he does not understand why this was not charged through insurance.  [phone] is his call back number.

## 2024-01-18 ENCOUNTER — Telehealth: Payer: Self-pay | Admitting: Nurse Practitioner

## 2024-01-18 NOTE — Telephone Encounter (Signed)
 Copied from CRM [id number]. Topic: General - Billing Inquiry >> Jan 17, 2024 10:22 AM Higinio Roger wrote: Reason for CRM: Patient has a billing issue resulting in $260  Callback #: [id number] >> Jan 17, 2024 10:42 AM Geroge Baseman wrote: Patient states that he has spoken to filling and he does not believe he should have this charge, he states he got a letter stating that if it is not resolved in 30 days he will have to pay the bill. The letter states there is a denial. He needs someone to give him a call back he does not understand why this was not charged through insurance.  [phone] is his call back number.

## 2024-02-12 ENCOUNTER — Other Ambulatory Visit: Payer: Self-pay | Admitting: Nurse Practitioner

## 2024-02-23 ENCOUNTER — Ambulatory Visit: Payer: Medicare HMO | Admitting: Nurse Practitioner

## 2024-02-24 DIAGNOSIS — Z79899 Other long term (current) drug therapy: Secondary | ICD-10-CM | POA: Insufficient documentation

## 2024-02-24 DIAGNOSIS — E119 Type 2 diabetes mellitus without complications: Secondary | ICD-10-CM | POA: Insufficient documentation

## 2024-02-24 NOTE — Patient Instructions (Signed)

## 2024-02-26 ENCOUNTER — Encounter: Payer: Self-pay | Admitting: Nurse Practitioner

## 2024-02-26 ENCOUNTER — Ambulatory Visit (INDEPENDENT_AMBULATORY_CARE_PROVIDER_SITE_OTHER): Admitting: Nurse Practitioner

## 2024-02-26 VITALS — BP 129/80 | HR 60 | Temp 98.5°F | Resp 15 | Ht 69.02 in | Wt 215.0 lb

## 2024-02-26 DIAGNOSIS — E119 Type 2 diabetes mellitus without complications: Secondary | ICD-10-CM | POA: Diagnosis not present

## 2024-02-26 DIAGNOSIS — Z79899 Other long term (current) drug therapy: Secondary | ICD-10-CM

## 2024-02-26 DIAGNOSIS — E785 Hyperlipidemia, unspecified: Secondary | ICD-10-CM

## 2024-02-26 DIAGNOSIS — Z7984 Long term (current) use of oral hypoglycemic drugs: Secondary | ICD-10-CM

## 2024-02-26 DIAGNOSIS — I5022 Chronic systolic (congestive) heart failure: Secondary | ICD-10-CM | POA: Diagnosis not present

## 2024-02-26 DIAGNOSIS — F422 Mixed obsessional thoughts and acts: Secondary | ICD-10-CM | POA: Diagnosis not present

## 2024-02-26 DIAGNOSIS — E1165 Type 2 diabetes mellitus with hyperglycemia: Secondary | ICD-10-CM

## 2024-02-26 DIAGNOSIS — F332 Major depressive disorder, recurrent severe without psychotic features: Secondary | ICD-10-CM

## 2024-02-26 DIAGNOSIS — E1169 Type 2 diabetes mellitus with other specified complication: Secondary | ICD-10-CM

## 2024-02-26 DIAGNOSIS — I483 Typical atrial flutter: Secondary | ICD-10-CM | POA: Diagnosis not present

## 2024-02-26 DIAGNOSIS — D696 Thrombocytopenia, unspecified: Secondary | ICD-10-CM | POA: Diagnosis not present

## 2024-02-26 LAB — BAYER DCA HB A1C WAIVED: HB A1C (BAYER DCA - WAIVED): 7.6 % — ABNORMAL HIGH (ref 4.8–5.6)

## 2024-02-26 MED ORDER — SITAGLIPTIN PHOSPHATE 100 MG PO TABS: 100.0000 mg | ORAL_TABLET | Freq: Every day | ORAL | 3 refills | Status: DC

## 2024-02-26 MED ORDER — APIXABAN 5 MG PO TABS: 5.0000 mg | ORAL_TABLET | Freq: Two times a day (BID) | ORAL | 3 refills | Status: AC

## 2024-02-26 NOTE — Assessment & Plan Note (Signed)
 Ongoing.  Noted on labs, however had improved on recheck.  Will continue to monitor, check CBC every 6 months.

## 2024-02-26 NOTE — Progress Notes (Signed)
 BP 129/80 (BP Location: Left Arm, Patient Position: Sitting, Cuff Size: Large)   Pulse 60   Temp 98.5 F (36.9 C) (Oral)   Resp 15   Ht 5' 9.02" (1.753 m)   Wt 215 lb (97.5 kg)   SpO2 96%   BMI 31.74 kg/m    Subjective:    Patient ID: Dillon [name], male    DOB: [birth year], 73 y.o.   MRN: [id number]  HPI: Dillon [name] is a 73 y.o. male  Chief Complaint  Patient presents with   Diabetes    Does not check daily. Has forgotten a lot lately. Mindful about diet but hesitant to answer   Hypertension    Also does not check at home.    DIABETES Diagnosed December 2022 when A1c was 9.9% at the time. A1c 7% in February.  Taking Jardiance  25 MG daily and Januvia  100 MG daily.  To be taking Vitamin B12 and D daily for past low levels. Trying to eat more healthy, more chicken and less fast food.  Metformin  caused issues with frequent diarrhea.   Hypoglycemic episodes:no Polydipsia/polyuria: none Visual disturbance: no Chest pain: no Paresthesias: no Glucose Monitoring: yes  Accucheck frequency: not  checking every day  Fasting glucose:   Post prandial:  Evening:  Before meals: Taking Insulin ?: no  Long acting insulin :  Short acting insulin : Blood Pressure Monitoring: not checking Retinal Examination: Not Up To Date -- macular degeneration, [facility] -- needs to schedule Foot Exam: Up to Date Pneumovax: Not up to Date - refuses vaccines Influenza: Not up to Date - refuses vaccines Aspirin: no   ATRIAL FLUTTER & HEART FAILURE Takes Eliquis , Metoprolol , Amiodarone , and Diltiazem. EF on 08/15/23 was 20-25%.  HF Clinic seen last on 08/24/23 and cardiology on 12/27/23.  Cardioversion done 08/15/23. Atrial fibrillation status: stable Satisfied with current treatment: yes  Medication side effects:  no Medication compliance: good compliance Etiology of atrial fibrillation: unknown Palpitations:  no Chest pain:  no Dyspnea on exertion:  no Orthopnea:  no -- sleeps  with one pillow Syncope:  no Edema:  occasionally at sockline Ventricular rate control: B-blocker Anti-coagulation: long acting   HYPERTENSION / HYPERLIPIDEMIA Takes Lisinopril , Cardizem, Lasix , Metoprolol , Amiodarone , + Crestor .  Low platelets on past labs. Satisfied with current treatment? yes Duration of hypertension: chronic BP monitoring frequency: not checking BP range:  BP medication side effects: no Past BP meds:  Duration of hyperlipidemia: chronic Aspirin: no Recent stressors: no Recurrent headaches: no Visual changes: no Palpitations: no Dyspnea: no Chest pain: no Lower extremity edema: no Dizzy/lightheaded: no   OCD Last saw psychiatry on 10/02/23.  History of ECT treatments (March 2024).  Takes Fluvoxamine  + Trazodone . Was to have follow-up on 11/30/23, but did not attend. Mood status: stable Satisfied with current treatment?: no Symptom severity: moderate Duration of current treatment : chronic Side effects: no Medication compliance: good compliance Psychotherapy/counseling: yes in the past Depressed mood: on occasion Anxious mood: on occasion Anhedonia:no Significant weight loss or gain: no Insomnia: varies -- occasional, wife died in their bed so he will never sleep there again, sleeps on couch Fatigue: no Feelings of worthlessness or guilt: no Impaired concentration/indecisiveness: no Suicidal ideations: no Hopelessness: no Crying spells: no    02/26/2024   10:06 AM 11/21/2023    9:50 AM 08/14/2023   10:02 AM 07/17/2023    9:46 AM 04/14/2023   10:15 AM  Depression screen PHQ 2/9  Decreased Interest 0 0 0 1 0  Down, Depressed, Hopeless 0 0 0 0 0  PHQ - 2 Score 0 0 0 1 0  Altered sleeping 0 0 0 0 0  Tired, decreased energy 0 0 0 0 0  Change in appetite 0 0 0 0 0  Feeling bad or failure about yourself  0 0 0 0 0  Trouble concentrating 0 0 0 0 0  Moving slowly or fidgety/restless 0 0 0 0 0  Suicidal thoughts 0 0 0 0 0  PHQ-9 Score 0 0 0 1 0   Difficult doing work/chores  Not difficult at all  Not difficult at all Not difficult at all       02/26/2024   10:07 AM 11/21/2023    9:50 AM 08/14/2023   10:02 AM 07/17/2023    9:46 AM  GAD 7 : Generalized Anxiety Score  Nervous, Anxious, on Edge 0 1 0 0  Control/stop worrying 0 0 0 0  Worry too much - different things 0 0 0 0  Trouble relaxing 0 0 0 0  Restless 0 0 0 0  Easily annoyed or irritable 0 0 0 0  Afraid - awful might happen 0 0 0 0  Total GAD 7 Score 0 1 0 0  Anxiety Difficulty  Not difficult at all  Not difficult at all   Relevant past medical, surgical, family and social history reviewed and updated as indicated. Interim medical history since our last visit reviewed. Allergies and medications reviewed and updated.  Review of Systems  Constitutional:  Negative for activity change, diaphoresis, fatigue and fever.  Respiratory:  Negative for cough, chest tightness, shortness of breath and wheezing.   Cardiovascular:  Negative for chest pain, palpitations and leg swelling.  Gastrointestinal: Negative.   Neurological: Negative.   Psychiatric/Behavioral:  Negative for decreased concentration, self-injury, sleep disturbance and suicidal ideas. The patient is not nervous/anxious.     Per HPI unless specifically indicated above     Objective:    BP 129/80 (BP Location: Left Arm, Patient Position: Sitting, Cuff Size: Large)   Pulse 60   Temp 98.5 F (36.9 C) (Oral)   Resp 15   Ht 5' 9.02" (1.753 m)   Wt 215 lb (97.5 kg)   SpO2 96%   BMI 31.74 kg/m   Wt Readings from Last 3 Encounters:  02/26/24 215 lb (97.5 kg)  11/21/23 210 lb 6.4 oz (95.4 kg)  10/02/23 224 lb (101.6 kg)    Physical Exam Vitals and nursing note reviewed.  Constitutional:      General: He is awake. He is not in acute distress.    Appearance: He is well-developed and well-groomed. He is obese. He is not ill-appearing or toxic-appearing.  HENT:     Head: Normocephalic.     Right Ear: Hearing  and external ear normal.     Left Ear: Hearing and external ear normal.  Eyes:     General: Lids are normal.     Extraocular Movements: Extraocular movements intact.     Conjunctiva/sclera: Conjunctivae normal.  Neck:     Thyroid : No thyromegaly.     Vascular: No carotid bruit.  Cardiovascular:     Rate and Rhythm: Normal rate and regular rhythm.     Heart sounds: Normal heart sounds. No murmur heard.    No gallop.  Pulmonary:     Effort: No accessory muscle usage or respiratory distress.     Breath sounds: Normal breath sounds.  Abdominal:     General: Bowel sounds  are normal. There is no distension.     Palpations: Abdomen is soft.     Tenderness: There is no abdominal tenderness.  Musculoskeletal:     Cervical back: Full passive range of motion without pain.     Right lower leg: No edema.     Left lower leg: No edema.  Lymphadenopathy:     Cervical: No cervical adenopathy.  Skin:    General: Skin is warm.     Capillary Refill: Capillary refill takes less than 2 seconds.  Neurological:     Mental Status: He is alert and oriented to person, place, and time.     Deep Tendon Reflexes: Reflexes are normal and symmetric.     Reflex Scores:      Brachioradialis reflexes are 2+ on the right side and 2+ on the left side.      Patellar reflexes are 2+ on the right side and 2+ on the left side. Psychiatric:        Attention and Perception: Attention normal.        Mood and Affect: Mood normal.        Speech: Speech normal.        Behavior: Behavior normal. Behavior is cooperative.        Thought Content: Thought content normal.    Diabetic Foot Exam - Simple   Simple Foot Form Visual Inspection See comments: Yes Sensation Testing Intact to touch and monofilament testing bilaterally: Yes Pulse Check Posterior Tibialis and Dorsalis pulse intact bilaterally: Yes Comments Thick toenails.       04/14/2023   10:39 AM 02/28/2023   11:34 AM 02/10/2022   11:05 AM 02/08/2021    11:24 AM  6CIT Screen  What Year? 0 points 0 points 0 points 0 points  What month? 0 points 0 points 0 points 0 points  What time? 0 points 0 points 0 points 0 points  Count back from 20 0 points 0 points 0 points 0 points  Months in reverse 0 points 2 points 2 points 0 points  Repeat phrase 0 points 0 points 4 points 4 points  Total Score 0 points 2 points 6 points 4 points   Results for orders placed or performed in visit on 11/21/23  Urine Microalbumin w/creat. ratio   Collection Time: 11/21/23 10:32 AM  Result Value Ref Range   Creatinine, Urine 69.7 Not Estab. mg/dL   Microalbumin, Urine 16.1 Not Estab. ug/mL   Microalb/Creat Ratio 23 0 - 29 mg/g creat  Comprehensive metabolic panel   Collection Time: 11/21/23 10:33 AM  Result Value Ref Range   Glucose 176 (H) 70 - 99 mg/dL   BUN 21 8 - 27 mg/dL   Creatinine, Ser 0.96 0.76 - 1.27 mg/dL   eGFR 71 >04 VW/UJW/1.19   BUN/Creatinine Ratio 19 10 - 24   Sodium 145 (H) 134 - 144 mmol/L   Potassium 4.1 3.5 - 5.2 mmol/L   Chloride 107 (H) 96 - 106 mmol/L   CO2 20 20 - 29 mmol/L   Calcium  9.9 8.6 - 10.2 mg/dL   Total Protein 6.5 6.0 - 8.5 g/dL   Albumin 4.4 3.9 - 4.9 g/dL   Globulin, Total 2.1 1.5 - 4.5 g/dL   Bilirubin Total 0.7 0.0 - 1.2 mg/dL   Alkaline Phosphatase 151 (H) 44 - 121 IU/L   AST 18 0 - 40 IU/L   ALT 33 0 - 44 IU/L  Lipid Panel w/o Chol/HDL Ratio   Collection Time: 11/21/23 10:33  AM  Result Value Ref Range   Cholesterol, Total 118 100 - 199 mg/dL   Triglycerides 90 0 - 149 mg/dL   HDL 46 >62 mg/dL   VLDL Cholesterol Cal 17 5 - 40 mg/dL   LDL Chol Calc (NIH) 55 0 - 99 mg/dL  Vitamin Z30   Collection Time: 11/21/23 10:33 AM  Result Value Ref Range   Vitamin B-12 579 232 - 1,245 pg/mL  VITAMIN D  25 Hydroxy (Vit-D Deficiency, Fractures)   Collection Time: 11/21/23 10:33 AM  Result Value Ref Range   Vit D, 25-Hydroxy 43.3 30.0 - 100.0 ng/mL  CBC with Differential/Platelet   Collection Time: 11/21/23 10:33 AM   Result Value Ref Range   WBC 8.8 3.4 - 10.8 x10E3/uL   RBC 5.99 (H) 4.14 - 5.80 x10E6/uL   Hemoglobin 18.0 (H) 13.0 - 17.7 g/dL   Hematocrit 86.5 (H) 78.4 - 51.0 %   MCV 91 79 - 97 fL   MCH 30.1 26.6 - 33.0 pg   MCHC 33.0 31.5 - 35.7 g/dL   RDW 69.6 29.5 - 28.4 %   Platelets 112 (L) 150 - 450 x10E3/uL   Neutrophils 60 Not Estab. %   Lymphs 30 Not Estab. %   Monocytes 9 Not Estab. %   Eos 1 Not Estab. %   Basos 0 Not Estab. %   Neutrophils Absolute 5.2 1.4 - 7.0 x10E3/uL   Lymphocytes Absolute 2.6 0.7 - 3.1 x10E3/uL   Monocytes Absolute 0.8 0.1 - 0.9 x10E3/uL   EOS (ABSOLUTE) 0.1 0.0 - 0.4 x10E3/uL   Basophils Absolute 0.0 0.0 - 0.2 x10E3/uL   Immature Granulocytes 0 Not Estab. %   Immature Grans (Abs) 0.0 0.0 - 0.1 x10E3/uL  HgB A1c   Collection Time: 11/21/23 10:33 AM  Result Value Ref Range   Hgb A1c MFr Bld 7.0 (H) 4.8 - 5.6 %   Est. average glucose Bld gHb Est-mCnc 154 mg/dL      Assessment & Plan:   Problem List Items Addressed This Visit       Cardiovascular and Mediastinum   Typical atrial flutter (HCC)   Diagnosed on 07/17/23.  Following with cardiology with successful cardioversion on 08/15/23.  Continue collaboration with cardiology and current medication regimen as ordered.  Recommend he bring medications to all visits.      Relevant Medications   amiodarone  (PACERONE ) 200 MG tablet   apixaban  (ELIQUIS ) 5 MG TABS tablet   Other Relevant Orders   Comprehensive metabolic panel with GFR   Chronic systolic heart failure (HCC)   Ongoing, followed by cardiology and HF clinic.  Suspect related to Atrial Flutter which was diagnosed in October 2024.  Continue current medication regimen and adjust as needed.  Euvolemic today. - Reminded to call for an overnight weight gain of >2 pounds or a weekly weight gain of >5 pounds - not adding salt to food and read food labels. Reviewed the importance of keeping daily sodium intake to 2000mg  daily.  - Avoid Ibuprofen  products      Relevant Medications   amiodarone  (PACERONE ) 200 MG tablet   apixaban  (ELIQUIS ) 5 MG TABS tablet   Other Relevant Orders   Comprehensive metabolic panel with GFR     Endocrine   Type 2 diabetes mellitus with hyperglycemia, without long-term current use of insulin  [facility]) - Primary   Diagnosed December 2022, A1c 9.9% at the time.  A1c 7.6% today, trend up, and urine ALB 02 December 2023.  Did not tolerate Metformin   in past, severe diarrhea.  - Continue Jardiance  25 MG and Januvia  100 MG as they are offering benefit.  Could consider change to GLP1 in future, however unsure he will perform weekly injection, or could do Rybelsus.  Recommend he check BS at home daily and document for next visit so provider can review.  Focus on diabetic diet.  LABS: CMP, A1c.  Bring all pill bottles to next visit.  - Will not add on any medications at this time, but provided patient information on diet for diabetes with print outs for him to follow. - Refuses vaccines - Foot exam up to date and needs eye exam -- recommend he schedule ASAP - ACE and statin on board      Relevant Medications   sitaGLIPtin  (JANUVIA ) 100 MG tablet   Other Relevant Orders   Bayer DCA Hb A1c Waived   Hyperlipidemia associated with type 2 diabetes mellitus (HCC)   Chronic, ongoing.  Will continue Rosuvastatin  10 MG daily as is tolerating and monitor, adjust as needed.  Lipid panel and CMP today.      Relevant Medications   amiodarone  (PACERONE ) 200 MG tablet   apixaban  (ELIQUIS ) 5 MG TABS tablet   sitaGLIPtin  (JANUVIA ) 100 MG tablet   Other Relevant Orders   Bayer DCA Hb A1c Waived   Comprehensive metabolic panel with GFR   Lipid Panel w/o Chol/HDL Ratio   Diabetes mellitus treated with oral medication (HCC)   Refer to diabetes with hyperglycemia plan of care.      Relevant Medications   sitaGLIPtin  (JANUVIA ) 100 MG tablet   Other Relevant Orders   Bayer DCA Hb A1c Waived     Hematopoietic and Hemostatic    Thrombocytopenia (HCC)   Ongoing.  Noted on labs, however had improved on recheck.  Will continue to monitor, check CBC every 6 months.        Other   Severe recurrent major depression without psychotic features (HCC)   Chronic, ongoing.  Denies SI/HI.  Overall mood stable per patient.  Will continue current medication regimen and collaboration with psychiatry.  Obtain EKG as needed dependent on psychiatry recommendations, annual checks. Recommend he schedule follow-up with psychiatry, missed February one.      OCD (obsessive compulsive disorder)   Refer to depression plan of care.      Long term current use of amiodarone    For atrial fibrillation, ordered by cardiology. Continue this collaboration.  Recent notes reviewed.  Check thyroid  levels every 6 months.      Relevant Orders   T4, free   TSH     Follow up plan: Return in about 3 months (around 05/28/2024) for T2DM, HTN/HLD, ANXIETY.

## 2024-02-26 NOTE — Assessment & Plan Note (Signed)
 Chronic, ongoing.  Denies SI/HI.  Overall mood stable per patient.  Will continue current medication regimen and collaboration with psychiatry.  Obtain EKG as needed dependent on psychiatry recommendations, annual checks. Recommend he schedule follow-up with psychiatry, missed February one.

## 2024-02-26 NOTE — Assessment & Plan Note (Signed)
 For atrial fibrillation, ordered by cardiology. Continue this collaboration.  Recent notes reviewed.  Check thyroid  levels every 6 months.

## 2024-02-26 NOTE — Assessment & Plan Note (Signed)
 Diagnosed on 07/17/23.  Following with cardiology with successful cardioversion on 08/15/23.  Continue collaboration with cardiology and current medication regimen as ordered.  Recommend he bring medications to all visits.

## 2024-02-26 NOTE — Progress Notes (Signed)
 Patient scheduled for appointment 02-27-24

## 2024-02-26 NOTE — Assessment & Plan Note (Signed)
Chronic, ongoing.  Will continue Rosuvastatin 10 MG daily as is tolerating and monitor, adjust as needed.  Lipid panel and CMP today.

## 2024-02-26 NOTE — Assessment & Plan Note (Signed)
 Diagnosed December 2022, A1c 9.9% at the time.  A1c 7.6% today, trend up, and urine ALB 02 December 2023.  Did not tolerate Metformin  in past, severe diarrhea.  - Continue Jardiance  25 MG and Januvia  100 MG as they are offering benefit.  Could consider change to GLP1 in future, however unsure he will perform weekly injection, or could do Rybelsus.  Recommend he check BS at home daily and document for next visit so provider can review.  Focus on diabetic diet.  LABS: CMP, A1c.  Bring all pill bottles to next visit.  - Will not add on any medications at this time, but provided patient information on diet for diabetes with print outs for him to follow. - Refuses vaccines - Foot exam up to date and needs eye exam -- recommend he schedule ASAP - ACE and statin on board

## 2024-02-26 NOTE — Assessment & Plan Note (Signed)
 Ongoing, followed by cardiology and HF clinic.  Suspect related to Atrial Flutter which was diagnosed in October 2024.  Continue current medication regimen and adjust as needed.  Euvolemic today. - Reminded to call for an overnight weight gain of >2 pounds or a weekly weight gain of >5 pounds - not adding salt to food and read food labels. Reviewed the importance of keeping daily sodium intake to 2000mg  daily.  - Avoid Ibuprofen products

## 2024-02-26 NOTE — Assessment & Plan Note (Signed)
 Refer to depression plan of care.

## 2024-02-26 NOTE — Assessment & Plan Note (Signed)
Refer to diabetes with hyperglycemia plan of care.

## 2024-02-27 ENCOUNTER — Ambulatory Visit: Payer: Self-pay | Admitting: Nurse Practitioner

## 2024-02-27 ENCOUNTER — Encounter: Payer: Self-pay | Admitting: Psychiatry

## 2024-02-27 ENCOUNTER — Ambulatory Visit (INDEPENDENT_AMBULATORY_CARE_PROVIDER_SITE_OTHER): Admitting: Psychiatry

## 2024-02-27 VITALS — BP 122/78 | HR 74 | Temp 98.3°F | Ht 69.02 in | Wt 214.2 lb

## 2024-02-27 DIAGNOSIS — F429 Obsessive-compulsive disorder, unspecified: Secondary | ICD-10-CM | POA: Diagnosis not present

## 2024-02-27 DIAGNOSIS — F3342 Major depressive disorder, recurrent, in full remission: Secondary | ICD-10-CM

## 2024-02-27 DIAGNOSIS — G4700 Insomnia, unspecified: Secondary | ICD-10-CM | POA: Diagnosis not present

## 2024-02-27 LAB — COMPREHENSIVE METABOLIC PANEL WITH GFR
ALT: 36 IU/L (ref 0–44)
AST: 27 IU/L (ref 0–40)
Albumin: 4.6 g/dL (ref 3.8–4.8)
Alkaline Phosphatase: 126 IU/L — ABNORMAL HIGH (ref 44–121)
BUN/Creatinine Ratio: 18 (ref 10–24)
BUN: 20 mg/dL (ref 8–27)
Bilirubin Total: 1.2 mg/dL (ref 0.0–1.2)
CO2: 20 mmol/L (ref 20–29)
Calcium: 10.4 mg/dL — ABNORMAL HIGH (ref 8.6–10.2)
Chloride: 103 mmol/L (ref 96–106)
Creatinine, Ser: 1.11 mg/dL (ref 0.76–1.27)
Globulin, Total: 1.9 g/dL (ref 1.5–4.5)
Glucose: 224 mg/dL — ABNORMAL HIGH (ref 70–99)
Potassium: 4.2 mmol/L (ref 3.5–5.2)
Sodium: 143 mmol/L (ref 134–144)
Total Protein: 6.5 g/dL (ref 6.0–8.5)
eGFR: 71 mL/min/{1.73_m2} (ref >59)

## 2024-02-27 LAB — LIPID PANEL W/O CHOL/HDL RATIO
Cholesterol, Total: 132 mg/dL (ref 100–199)
HDL: 40 mg/dL (ref >39)
LDL Chol Calc (NIH): 55 mg/dL (ref 0–99)
Triglycerides: 231 mg/dL — ABNORMAL HIGH (ref 0–149)
VLDL Cholesterol Cal: 37 mg/dL (ref 5–40)

## 2024-02-27 LAB — T4, FREE: Free T4: 1.3 ng/dL (ref 0.82–1.77)

## 2024-02-27 LAB — TSH: TSH: 1.15 u[IU]/mL (ref 0.450–4.500)

## 2024-02-27 MED ORDER — FLUVOXAMINE MALEATE 100 MG PO TABS: 100.0000 mg | ORAL_TABLET | Freq: Two times a day (BID) | ORAL | 0 refills | Status: DC
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
## 2024-02-27 NOTE — Progress Notes (Signed)
 Good afternoon, please let Dillon [name] know his labs have returned: - Kidney and liver function remain stable. - Glucose was up a little, work on diet like we discussed. - Lipid panel shows LDL, bad cholesterol, at goal.  Any questions? Keep being amazing!!  Thank you for allowing me to participate in your care.  I appreciate you. Kindest regards, Cleota Pellerito

## 2024-02-27 NOTE — Patient Instructions (Signed)
 Continue fluvoxamine  100 mg twice a day  Next appointment: 8/5 at 2:30

## 2024-03-01 NOTE — Telephone Encounter (Signed)
 Submitted request to [employer] to review patient's bill for resolution.

## 2024-03-04 ENCOUNTER — Telehealth: Payer: Self-pay | Admitting: Nurse Practitioner

## 2024-03-04 NOTE — Telephone Encounter (Signed)
 Copied from CRM [id number]. Topic: Medicare AWV >> Mar 04, 2024  9:49 AM Juliana Ocean wrote: Reason for CRM: Called 03/04/24 reminder call for AWV appt 03/05/24 @10 :40am NO VM   Rosalee Collins; Care Guide Ambulatory Clinical Support [place] [facility] Direct Dial: [phone]

## 2024-03-05 ENCOUNTER — Ambulatory Visit: Payer: Self-pay | Admitting: Emergency Medicine

## 2024-03-05 VITALS — Ht 69.0 in | Wt 215.0 lb

## 2024-03-05 DIAGNOSIS — Z1211 Encounter for screening for malignant neoplasm of colon: Secondary | ICD-10-CM

## 2024-03-05 DIAGNOSIS — Z0000 Encounter for general adult medical examination without abnormal findings: Secondary | ICD-10-CM

## 2024-03-05 NOTE — Progress Notes (Signed)
 Subjective:   Dillon [name] is a 73 y.o. who presents for a Medicare Wellness preventive visit.  As a reminder, Annual Wellness Visits don't include a physical exam, and some assessments may be limited, especially if this visit is performed virtually. We may recommend an in-person follow-up visit with your provider if needed.  Visit Complete: Virtual I connected with  Dillon [name] on 03/05/24 by a audio enabled telemedicine application and verified that I am speaking with the correct person using two identifiers.  Patient Location: Home  Provider Location: Home Office  I discussed the limitations of evaluation and management by telemedicine. The patient expressed understanding and agreed to proceed.  Vital Signs: Because this visit was a virtual/telehealth visit, some criteria may be missing or patient reported. Any vitals not documented were not able to be obtained and vitals that have been documented are patient reported.  VideoDeclined- This patient declined Librarian, academic. Therefore the visit was completed with audio only.  Persons Participating in Visit: Patient.  AWV Questionnaire: No: Patient Medicare AWV questionnaire was not completed prior to this visit.  Cardiac Risk Factors include: advanced age (>49men, >45 women);male gender;diabetes mellitus;dyslipidemia;obesity (BMI >30kg/m2)     Objective:     Today's Vitals   03/05/24 1038  Weight: 215 lb (97.5 kg)  Height: 5\' 9"  (1.753 m)   Body mass index is 31.75 kg/m.     03/05/2024   10:57 AM 08/14/2023    1:00 PM 08/14/2023   11:20 AM 02/28/2023   11:29 AM 12/27/2022   11:18 AM 12/21/2022    1:08 PM 12/13/2022    8:46 AM  Advanced Directives  Does Patient Have a Medical Advance Directive? No No No No No No No  Would patient like information on creating a medical advance directive? Yes (MAU/Ambulatory/Procedural Areas - Information given) No - Patient declined  No - Patient  declined No - Patient declined No - Patient declined No - Patient declined    Current Medications (verified) Outpatient Encounter Medications as of 03/05/2024  Medication Sig   apixaban  (ELIQUIS ) 5 MG TABS tablet Take 1 tablet (5 mg total) by mouth 2 (two) times daily.   diltiazem (CARDIZEM) 30 MG tablet Take 30 mg by mouth 4 (four) times daily.   empagliflozin  (JARDIANCE ) 25 MG TABS tablet Take 1 tablet (25 mg total) by mouth daily before breakfast.   fluvoxaMINE  (LUVOX ) 100 MG tablet Take 1 tablet (100 mg total) by mouth 2 (two) times daily.   glucose blood (ACCU-CHEK GUIDE) test strip Use to check blood sugars 2-3 times daily with goals = <130 fasting in morning and <180 two hours after eating. Bring blood sugar log to appointments.   metoprolol  succinate (TOPROL -XL) 100 MG 24 hr tablet Take 1 tablet by mouth 2 (two) times daily.   Multiple Vitamins-Minerals (ICAPS AREDS 2 PO) Take 1 tablet by mouth 2 (two) times daily.   rosuvastatin  (CRESTOR ) 10 MG tablet Take 1 tablet (10 mg total) by mouth at bedtime.   sitaGLIPtin  (JANUVIA ) 100 MG tablet Take 1 tablet (100 mg total) by mouth daily.   tamsulosin  (FLOMAX ) 0.4 MG CAPS capsule Take 1 capsule (0.4 mg total) by mouth daily after breakfast.   Vitamin D , Ergocalciferol , (DRISDOL ) 1.25 MG (50000 UNIT) CAPS capsule Take 1 capsule (50,000 Units total) by mouth every 7 (seven) days. (Patient taking differently: Take 50,000 Units by mouth every Sunday.)   amiodarone  (PACERONE ) 200 MG tablet Take 200 mg by mouth daily. (Patient  not taking: Reported on 03/05/2024)   cyanocobalamin  (VITAMIN B12) 1000 MCG tablet Take 1 tablet (1,000 mcg total) by mouth daily. (Patient not taking: Reported on 03/05/2024)   furosemide  (LASIX ) 20 MG tablet Take 1 tablet (20 mg total) by mouth 3 (three) times a week. (Patient not taking: Reported on 03/05/2024)   lisinopril  (ZESTRIL ) 5 MG tablet Take 5 mg by mouth daily. (Patient not taking: Reported on 03/05/2024)   No  facility-administered encounter medications on file as of 03/05/2024.    Allergies (verified) Codeine   History: Past Medical History:  Diagnosis Date   Anomalies of urachus, congenital    BPH with urinary obstruction    Depression    Diabetes mellitus without complication (HCC)    History of kidney stones    Hydronephrosis    Hyperlipidemia    Hypernatremia    Hypokalemia    Kidney stone    Macular degeneration    Meningitis    spinal   OCD (obsessive compulsive disorder)    Protein-calorie malnutrition, severe (HCC)    Shingles    Thrombocytopenia (HCC)    Past Surgical History:  Procedure Laterality Date   CARDIOVERSION N/A 08/15/2023   Procedure: CARDIOVERSION;  Surgeon: Alluri, Odessa Bene, MD;  Location: [facility];  Service: Cardiovascular;  Laterality: N/A;   CYSTOSCOPY W/ URETERAL STENT PLACEMENT Right 10/12/2022   Procedure: CYSTOSCOPY WITH RETROGRADE PYELOGRAM/URETERAL STENT PLACEMENT;  Surgeon: Geraline Knapp, MD;  Location: [facility];  Service: Urology;  Laterality: Right;   CYSTOSCOPY/URETEROSCOPY/HOLMIUM LASER/STENT PLACEMENT Right 12/13/2022   Procedure: CYSTOSCOPY/URETEROSCOPY/HOLMIUM LASER/STENT EXCHANGE;  Surgeon: Geraline Knapp, MD;  Location: [facility];  Service: Urology;  Laterality: Right;   CYSTOSCOPY/URETEROSCOPY/HOLMIUM LASER/STENT PLACEMENT Right 12/27/2022   Procedure: CYSTOSCOPY/URETEROSCOPY/HOLMIUM LASER/STENT EXCHANGE;  Surgeon: Geraline Knapp, MD;  Location: [facility];  Service: Urology;  Laterality: Right;   KIDNEY STONE SURGERY     TEE WITHOUT CARDIOVERSION N/A 08/15/2023   Procedure: TRANSESOPHAGEAL ECHOCARDIOGRAM;  Surgeon: Alluri, Odessa Bene, MD;  Location: [facility];  Service: Cardiovascular;  Laterality: N/A;   Family History  Problem Relation Age of Onset   Cancer Mother        bladder   Emphysema Father    Emphysema Sister    COPD Sister    Cancer Brother        prostate   Social History   Socioeconomic History   Marital status:  Widowed    Spouse name: Not on file   Number of children: 2   Years of education: Not on file   Highest education level: GED or equivalent  Occupational History   Occupation: retired  Tobacco Use   Smoking status: Never    Passive exposure: Past   Smokeless tobacco: Never  Vaping Use   Vaping status: Never Used  Substance and Sexual Activity   Alcohol use: No    Alcohol/week: 0.0 standard drinks of alcohol   Drug use: No   Sexual activity: Not Currently  Other Topics Concern   Not on file  Social History Narrative   Live alone and  have two children that are very supportive.   Social Drivers of Corporate investment banker Strain: Low Risk  (03/05/2024)   Overall Financial Resource Strain (CARDIA)    Difficulty of Paying Living Expenses: Not hard at all  Food Insecurity: No Food Insecurity (03/05/2024)   Hunger Vital Sign    Worried About Running Out of Food in the Last Year: Never true    Ran Out  of Food in the Last Year: Never true  Transportation Needs: No Transportation Needs (03/05/2024)   PRAPARE - Administrator, Civil Service (Medical): No    Lack of Transportation (Non-Medical): No  Physical Activity: Insufficiently Active (03/05/2024)   Exercise Vital Sign    Days of Exercise per Week: 2 days    Minutes of Exercise per Session: 20 min  Stress: No Stress Concern Present (03/05/2024)   [name] of Occupational Health - Occupational Stress Questionnaire    Feeling of Stress : Not at all  Social Connections: Moderately Isolated (03/05/2024)   Social Connection and Isolation Panel [NHANES]    Frequency of Communication with Friends and Family: More than three times a week    Frequency of Social Gatherings with Friends and Family: More than three times a week    Attends Religious Services: 1 to 4 times per year    Active Member of [employer] or Organizations: No    Attends Banker Meetings: Never    Marital Status: Widowed    Tobacco  Counseling Counseling given: Not Answered    Clinical Intake:  Pre-visit preparation completed: Yes  Pain : No/denies pain     BMI - recorded: 31.75 Nutritional Status: BMI > 30  Obese Nutritional Risks: None Diabetes: Yes CBG done?: No Did pt. bring in CBG monitor from home?: No  Lab Results  Component Value Date   HGBA1C 7.6 (H) 02/26/2024   HGBA1C 7.0 (H) 11/21/2023   HGBA1C 8.3 (H) 07/17/2023     How often do you need to have someone help you when you read instructions, pamphlets, or other written materials from your doctor or pharmacy?: 1 - Never  Interpreter Needed?: No  Information entered by :: Jaunita Messier, CMA   Activities of Daily Living     03/05/2024   10:42 AM 08/14/2023    1:00 PM  In your present state of health, do you have any difficulty performing the following activities:  Hearing? 0 0  Vision? 0 0  Difficulty concentrating or making decisions? 0 0  Walking or climbing stairs? 0   Dressing or bathing? 0   Doing errands, shopping? 0 0  Preparing Food and eating ? N   Using the Toilet? N   In the past six months, have you accidently leaked urine? N   Do you have problems with loss of bowel control? N   Managing your Medications? N   Managing your Finances? N   Housekeeping or managing your Housekeeping? N     Patient Care Team: Lemar Pyles, NP as PCP - General (Nurse Practitioner) Custovic, Sabina, DO as Referring Physician (Cardiology) Todd Fossa, MD as Consulting Physician (Psychiatry) [facility], [facility] (Optometry)  Indicate any recent Medical Services you may have received from other than [facility] providers in the past year (date may be approximate).     Assessment:    This is a routine wellness examination for Dillon [name].  Hearing/Vision screen Hearing Screening - Comments:: Denies hearing loss Vision Screening - Comments:: Needs DM eye exam. Will call to schedule appointment with [facility]   Goals Addressed              This Visit's Progress    DIET - EAT MORE FRUITS AND VEGETABLES         Depression Screen     03/05/2024   10:54 AM 02/26/2024   10:06 AM 11/21/2023    9:50 AM 08/14/2023   10:02 AM  07/17/2023    9:46 AM 04/14/2023   10:15 AM 02/28/2023   11:27 AM  PHQ 2/9 Scores  PHQ - 2 Score 0 0 0 0 1 0 0  PHQ- 9 Score 0 0 0 0 1 0 0    Fall Risk     03/05/2024   10:58 AM 02/26/2024   10:06 AM 11/21/2023    9:48 AM 04/14/2023   10:14 AM 02/28/2023   11:29 AM  Fall Risk   Falls in the past year? 0 1 0 0 0  Number falls in past yr: 0 0 0 0 0  Injury with Fall? 0 0 0 0 0  Risk for fall due to : No Fall Risks No Fall Risks No Fall Risks No Fall Risks No Fall Risks  Follow up Falls prevention discussed;Falls evaluation completed Falls evaluation completed Falls evaluation completed Falls evaluation completed Falls prevention discussed;Falls evaluation completed    MEDICARE RISK AT HOME:  Medicare Risk at Home Any stairs in or around the home?: Yes If so, are there any without handrails?: No Home free of loose throw rugs in walkways, pet beds, electrical cords, etc?: Yes Adequate lighting in your home to reduce risk of falls?: No Life alert?: No Use of a cane, walker or w/c?: No Grab bars in the bathroom?: No Shower chair or bench in shower?: Yes Elevated toilet seat or a handicapped toilet?: No  TIMED UP AND GO:  Was the test performed?  No  Cognitive Function: 6CIT completed    04/17/2017    9:04 AM 03/31/2017    9:03 AM 03/24/2017    9:29 AM 01/27/2016    9:16 AM 01/20/2016    8:27 AM  MMSE - Mini Mental State Exam  Orientation to time       Orientation to Place       Registration       Attention/ Calculation       Recall       Language- name 2 objects       Language- repeat       Language- follow 3 step command       Language- read & follow direction       Write a sentence       Copy design       Total score          Information is confidential and restricted. Go to Review  Flowsheets to unlock data.        03/05/2024   11:00 AM 04/14/2023   10:39 AM 02/28/2023   11:34 AM 02/10/2022   11:05 AM 02/08/2021   11:24 AM  6CIT Screen  What Year? 0 points 0 points 0 points 0 points 0 points  What month? 3 points 0 points 0 points 0 points 0 points  What time? 0 points 0 points 0 points 0 points 0 points  Count back from 20 0 points 0 points 0 points 0 points 0 points  Months in reverse 0 points 0 points 2 points 2 points 0 points  Repeat phrase 4 points 0 points 0 points 4 points 4 points  Total Score 7 points 0 points 2 points 6 points 4 points    Immunizations  There is no immunization history on file for this patient.  Screening Tests Health Maintenance  Topic Date Due   OPHTHALMOLOGY EXAM  11/22/2022   COVID-19 Vaccine (1) 03/13/2024 (Originally 01/01/1956)   Zoster Vaccines- Shingrix (1 of 2) 05/28/2024 (Originally 01/02/1970)  Colonoscopy  08/11/2024 (Originally 01/01/1998)   Pneumonia Vaccine 70+ Years old (1 of 2 - PCV) 02/25/2025 (Originally 01/02/1970)   INFLUENZA VACCINE  05/17/2024   HEMOGLOBIN A1C  08/28/2024   Diabetic kidney evaluation - Urine ACR  11/20/2024   Diabetic kidney evaluation - eGFR measurement  02/25/2025   FOOT EXAM  02/25/2025   Medicare Annual Wellness (AWV)  03/05/2025   Hepatitis C Screening  Completed   HPV VACCINES  Aged Out   Meningococcal B Vaccine  Aged Out   DTaP/Tdap/Td  Discontinued    Health Maintenance  Health Maintenance Due  Topic Date Due   OPHTHALMOLOGY EXAM  11/22/2022   Health Maintenance Items Addressed: Cologuard Ordered, See Nurse Notes  Additional Screening:  Vision Screening: Recommended annual ophthalmology exams for early detection of glaucoma and other disorders of the eye.  Dental Screening: Recommended annual dental exams for proper oral hygiene  Community Resource Referral / Chronic Care Management: CRR required this visit?  No   CCM required this visit?  No   Plan:    I  have personally reviewed and noted the following in the patient's chart:   Medical and social history Use of alcohol, tobacco or illicit drugs  Current medications and supplements including opioid prescriptions. Patient is not currently taking opioid prescriptions. Functional ability and status Nutritional status Physical activity Advanced directives List of other physicians Hospitalizations, surgeries, and ER visits in previous 12 months Vitals Screenings to include cognitive, depression, and falls Referrals and appointments  In addition, I have reviewed and discussed with patient certain preventive protocols, quality metrics, and best practice recommendations. A written personalized care plan for preventive services as well as general preventive health recommendations were provided to patient.   Jaunita Messier, CMA   03/05/2024   After Visit Summary: (Mail) Due to this being a telephonic visit, the after visit summary with patients personalized plan was offered to patient via mail   Notes: Please refer to Routing Comments.

## 2024-03-05 NOTE — Patient Instructions (Signed)
 Dillon [name] , Thank you for taking time out of your busy schedule to complete your Annual Wellness Visit with me. I enjoyed our conversation and look forward to speaking with you again next year. I, as well as your care team,  appreciate your ongoing commitment to your health goals. Please review the following plan we discussed and let me know if I can assist you in the future. Your Game plan/ To Do List    Referrals: None  Follow up Visits: Next Medicare AWV with our clinical staff: 03/11/25 @ 10:40am (phone visit)   Have you seen your provider in the last 6 months (3 months if uncontrolled diabetes)? Yes Next Office Visit with your provider: 05/30/24 @ 11:00 am with Jolene Cannady, NP  Clinician Recommendations:  Aim for 30 minutes of exercise or brisk walking, 6-8 glasses of water, and 5 servings of fruits and vegetables each day. Call [facility] @ [phone] to schedule a diabetic eye exam at your earliest convenience. I have placed an order for a cologuard kit to screen for colon cancer. You should receive this in the mail.      This is a list of the screening recommended for you and due dates:  Health Maintenance  Topic Date Due   Eye exam for diabetics  11/22/2022   COVID-19 Vaccine (1) 03/13/2024*   Zoster (Shingles) Vaccine (1 of 2) 05/28/2024*   Colon Cancer Screening  08/11/2024*   Pneumonia Vaccine (1 of 2 - PCV) 02/25/2025*   Flu Shot  05/17/2024   Hemoglobin A1C  08/28/2024   Yearly kidney health urinalysis for diabetes  11/20/2024   Yearly kidney function blood test for diabetes  02/25/2025   Complete foot exam   02/25/2025   Medicare Annual Wellness Visit  03/05/2025   Hepatitis C Screening  Completed   HPV Vaccine  Aged Out   Meningitis B Vaccine  Aged Out   DTaP/Tdap/Td vaccine  Discontinued  *Topic was postponed. The date shown is not the original due date.    Advanced directives: (Provided) Advance directive discussed with you today. I have provided a copy for  you to complete at home and have notarized. Once this is complete, please bring a copy in to our office so we can scan it into your chart.  Advance Care Planning is important because it:  [x]  Makes sure you receive the medical care that is consistent with your values, goals, and preferences  [x]  It provides guidance to your family and loved ones and reduces their decisional burden about whether or not they are making the right decisions based on your wishes.  Follow the link provided in your after visit summary or read over the paperwork we have mailed to you to help you started getting your Advance Directives in place. If you need assistance in completing these, please reach out to us  so that we can help you!  See attachments for Preventive Care and Fall Prevention Tips.   Fall Prevention in the Home, Adult Falls can cause injuries and affect people of all ages. There are many simple things that you can do to make your home safe and to help prevent falls. If you need it, ask for help making these changes. What actions can I take to prevent falls? General information Use good lighting in all rooms. Make sure to: Replace any light bulbs that burn out. Turn on lights if it is dark and use night-lights. Keep items that you use often in easy-to-reach places. Lower  the shelves around your home if needed. Move furniture so that there are clear paths around it. Do not keep throw rugs or other things on the floor that can make you trip. If any of your floors are uneven, fix them. Add color or contrast paint or tape to clearly mark and help you see: Grab bars or handrails. First and last steps of staircases. Where the edge of each step is. If you use a ladder or stepladder: Make sure that it is fully opened. Do not climb a closed ladder. Make sure the sides of the ladder are locked in place. Have someone hold the ladder while you use it. Know where your pets are as you move through your  home. What can I do in the bathroom?     Keep the floor dry. Clean up any water that is on the floor right away. Remove soap buildup in the bathtub or shower. Buildup makes bathtubs and showers slippery. Use non-skid mats or decals on the floor of the bathtub or shower. Attach bath mats securely with double-sided, non-slip rug tape. If you need to sit down while you are in the shower, use a non-slip stool. Install grab bars by the toilet and in the bathtub and shower. Do not use towel bars as grab bars. What can I do in the bedroom? Make sure that you have a light by your bed that is easy to reach. Do not use any sheets or blankets on your bed that hang to the floor. Have a firm bench or chair with side arms that you can use for support when you get dressed. What can I do in the kitchen? Clean up any spills right away. If you need to reach something above you, use a sturdy step stool that has a grab bar. Keep electrical cables out of the way. Do not use floor polish or wax that makes floors slippery. What can I do with my stairs? Do not leave anything on the stairs. Make sure that you have a light switch at the top and the bottom of the stairs. Have them installed if you do not have them. Make sure that there are handrails on both sides of the stairs. Fix handrails that are broken or loose. Make sure that handrails are as long as the staircases. Install non-slip stair treads on all stairs in your home if they do not have carpet. Avoid having throw rugs at the top or bottom of stairs, or secure the rugs with carpet tape to prevent them from moving. Choose a carpet design that does not hide the edge of steps on the stairs. Make sure that carpet is firmly attached to the stairs. Fix any carpet that is loose or worn. What can I do on the outside of my home? Use bright outdoor lighting. Repair the edges of walkways and driveways and fix any cracks. Clear paths of anything that can make you  trip, such as tools or rocks. Add color or contrast paint or tape to clearly mark and help you see high doorway thresholds. Trim any bushes or trees on the main path into your home. Check that handrails are securely fastened and in good repair. Both sides of all steps should have handrails. Install guardrails along the edges of any raised decks or porches. Have leaves, snow, and ice cleared regularly. Use sand, salt, or ice melt on walkways during winter months if you live where there is ice and snow. In the garage, clean up any  spills right away, including grease or oil spills. What other actions can I take? Review your medicines with your health care provider. Some medicines can make you confused or feel dizzy. This can increase your chance of falling. Wear closed-toe shoes that fit well and support your feet. Wear shoes that have rubber soles and low heels. Use a cane, walker, scooter, or crutches that help you move around if needed. Talk with your provider about other ways that you can decrease your risk of falls. This may include seeing a physical therapist to learn to do exercises to improve movement and strength. Where to find more information Centers for Disease Control and Prevention, STEADI: TonerPromos.no [employer] on Aging: BaseRingTones.pl National Institute on Aging: BaseRingTones.pl Contact a health care provider if: You are afraid of falling at home. You feel weak, drowsy, or dizzy at home. You fall at home. Get help right away if you: Lose consciousness or have trouble moving after a fall. Have a fall that causes a head injury. These symptoms may be an emergency. Get help right away. Call 911. Do not wait to see if the symptoms will go away. Do not drive yourself to the hospital. This information is not intended to replace advice given to you by your health care provider. Make sure you discuss any questions you have with your health care provider. Document Revised: 06/06/2022  Document Reviewed: 06/06/2022 Elsevier Patient Education  2024 [employer].

## 2024-03-15 DIAGNOSIS — E1136 Type 2 diabetes mellitus with diabetic cataract: Secondary | ICD-10-CM | POA: Diagnosis not present

## 2024-03-15 DIAGNOSIS — I4892 Unspecified atrial flutter: Secondary | ICD-10-CM | POA: Diagnosis not present

## 2024-03-15 DIAGNOSIS — N529 Male erectile dysfunction, unspecified: Secondary | ICD-10-CM | POA: Diagnosis not present

## 2024-03-15 DIAGNOSIS — Z885 Allergy status to narcotic agent status: Secondary | ICD-10-CM | POA: Diagnosis not present

## 2024-03-15 DIAGNOSIS — N400 Enlarged prostate without lower urinary tract symptoms: Secondary | ICD-10-CM | POA: Diagnosis not present

## 2024-03-15 DIAGNOSIS — I25119 Atherosclerotic heart disease of native coronary artery with unspecified angina pectoris: Secondary | ICD-10-CM | POA: Diagnosis not present

## 2024-03-15 DIAGNOSIS — E559 Vitamin D deficiency, unspecified: Secondary | ICD-10-CM | POA: Diagnosis not present

## 2024-03-15 DIAGNOSIS — Z7901 Long term (current) use of anticoagulants: Secondary | ICD-10-CM | POA: Diagnosis not present

## 2024-03-15 DIAGNOSIS — I130 Hypertensive heart and chronic kidney disease with heart failure and stage 1 through stage 4 chronic kidney disease, or unspecified chronic kidney disease: Secondary | ICD-10-CM | POA: Diagnosis not present

## 2024-03-15 DIAGNOSIS — I509 Heart failure, unspecified: Secondary | ICD-10-CM | POA: Diagnosis not present

## 2024-03-15 DIAGNOSIS — Z833 Family history of diabetes mellitus: Secondary | ICD-10-CM | POA: Diagnosis not present

## 2024-03-15 DIAGNOSIS — D6869 Other thrombophilia: Secondary | ICD-10-CM | POA: Diagnosis not present

## 2024-03-15 DIAGNOSIS — F411 Generalized anxiety disorder: Secondary | ICD-10-CM | POA: Diagnosis not present

## 2024-03-15 DIAGNOSIS — E669 Obesity, unspecified: Secondary | ICD-10-CM | POA: Diagnosis not present

## 2024-03-15 DIAGNOSIS — I4891 Unspecified atrial fibrillation: Secondary | ICD-10-CM | POA: Diagnosis not present

## 2024-03-15 DIAGNOSIS — E1122 Type 2 diabetes mellitus with diabetic chronic kidney disease: Secondary | ICD-10-CM | POA: Diagnosis not present

## 2024-03-15 DIAGNOSIS — M1990 Unspecified osteoarthritis, unspecified site: Secondary | ICD-10-CM | POA: Diagnosis not present

## 2024-03-15 DIAGNOSIS — Z6831 Body mass index (BMI) 31.0-31.9, adult: Secondary | ICD-10-CM | POA: Diagnosis not present

## 2024-03-15 DIAGNOSIS — I429 Cardiomyopathy, unspecified: Secondary | ICD-10-CM | POA: Diagnosis not present

## 2024-03-15 DIAGNOSIS — F320 Major depressive disorder, single episode, mild: Secondary | ICD-10-CM | POA: Diagnosis not present

## 2024-03-15 DIAGNOSIS — N182 Chronic kidney disease, stage 2 (mild): Secondary | ICD-10-CM | POA: Diagnosis not present

## 2024-03-15 DIAGNOSIS — R32 Unspecified urinary incontinence: Secondary | ICD-10-CM | POA: Diagnosis not present

## 2024-03-15 DIAGNOSIS — H3530 Unspecified macular degeneration: Secondary | ICD-10-CM | POA: Diagnosis not present

## 2024-03-15 DIAGNOSIS — F429 Obsessive-compulsive disorder, unspecified: Secondary | ICD-10-CM | POA: Diagnosis not present

## 2024-04-05 ENCOUNTER — Other Ambulatory Visit: Payer: Self-pay | Admitting: Nurse Practitioner

## 2024-04-08 NOTE — Telephone Encounter (Signed)
 Requested Prescriptions  Refused Prescriptions Disp Refills   JANUVIA  100 MG tablet [Pharmacy Med Name: JANUVIA  100MG  TABLETS] 90 tablet 3    Sig: TAKE 1 TABLET(100 MG) BY MOUTH DAILY     Endocrinology:  Diabetes - DPP-4 Inhibitors Passed - 04/08/2024  4:24 PM      Passed - HBA1C is between 0 and 7.9 and within 180 days    HB A1C (BAYER DCA - WAIVED)  Date Value Ref Range Status  02/26/2024 7.6 (H) 4.8 - 5.6 % Final    Comment:             Prediabetes: 5.7 - 6.4          Diabetes: >6.4          Glycemic control for adults with diabetes: <7.0          Passed - Cr in normal range and within 360 days    Creatinine  Date Value Ref Range Status  12/01/2013 1.17 0.60 - 1.30 mg/dL Final   Creatinine, Ser  Date Value Ref Range Status  02/26/2024 1.11 0.76 - 1.27 mg/dL Final         Passed - Valid encounter within last 6 months    Recent Outpatient Visits           1 month ago Type 2 diabetes mellitus with hyperglycemia, without long-term current use of insulin  (HCC)   [facility] [place], [place] T, NP   4 months ago Type 2 diabetes mellitus with hyperglycemia, without long-term current use of insulin  (HCC)   [facility] [place], Melanie DASEN, NP

## 2024-04-09 ENCOUNTER — Other Ambulatory Visit: Payer: Self-pay | Admitting: Nurse Practitioner

## 2024-04-09 NOTE — Telephone Encounter (Signed)
 Copied from CRM [id number]. Topic: Clinical - Medication Refill >> Apr 09, 2024 12:33 PM Rosaria E wrote: Medication: JANUVIA  100 MG tablet [Pharmacy Med Name: JANUVIA  100MG  TABLETS]   Has the patient contacted their pharmacy? Yes (Agent: If no, request that the patient contact the pharmacy for the refill. If patient does not wish to contact the pharmacy document the reason why and proceed with request.) (Agent: If yes, when and what did the pharmacy advise?)  This is the patient's preferred pharmacy:  [id number] [place], [place] - [address] AT [facility] OF [address] [place] [address] [place] [address] Phone: [phone] Fax: [phone]  Is this the correct pharmacy for this prescription? Yes If no, delete pharmacy and type the correct one.   Has the prescription been filled recently? Yes  Is the patient out of the medication? No, Has 1 left   Has the patient been seen for an appointment in the last year OR does the patient have an upcoming appointment? Yes  Can we respond through MyChart? Yes  Agent: Please be advised that Rx refills may take up to 3 business days. We ask that you follow-up with your pharmacy.

## 2024-04-11 NOTE — Telephone Encounter (Signed)
 Too soon for refill, refilled on 02/26/24 for 90 and 3 RF.  Requested Prescriptions  Pending Prescriptions Disp Refills   sitaGLIPtin  (JANUVIA ) 100 MG tablet 90 tablet 3    Sig: Take 1 tablet (100 mg total) by mouth daily.     Endocrinology:  Diabetes - DPP-4 Inhibitors Passed - 04/11/2024  9:43 AM      Passed - HBA1C is between 0 and 7.9 and within 180 days    HB A1C (BAYER DCA - WAIVED)  Date Value Ref Range Status  02/26/2024 7.6 (H) 4.8 - 5.6 % Final    Comment:             Prediabetes: 5.7 - 6.4          Diabetes: >6.4          Glycemic control for adults with diabetes: <7.0          Passed - Cr in normal range and within 360 days    Creatinine  Date Value Ref Range Status  12/01/2013 1.17 0.60 - 1.30 mg/dL Final   Creatinine, Ser  Date Value Ref Range Status  02/26/2024 1.11 0.76 - 1.27 mg/dL Final         Passed - Valid encounter within last 6 months    Recent Outpatient Visits           1 month ago Type 2 diabetes mellitus with hyperglycemia, without long-term current use of insulin  (HCC)   [facility] [place], Pag[place] T, NP   4 months ago Type 2 diabetes mellitus with hyperglycemia, without long-term current use of insulin  (HCC)   [facility] [place], Melanie DASEN, NP

## 2024-04-29 ENCOUNTER — Other Ambulatory Visit: Payer: Self-pay | Admitting: Urology
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
## 2024-05-21 ENCOUNTER — Ambulatory Visit (INDEPENDENT_AMBULATORY_CARE_PROVIDER_SITE_OTHER): Admitting: Psychiatry

## 2024-05-21 ENCOUNTER — Other Ambulatory Visit: Payer: Self-pay

## 2024-05-21 ENCOUNTER — Encounter: Payer: Self-pay | Admitting: Psychiatry

## 2024-05-21 VITALS — BP 143/97 | HR 76 | Temp 96.9°F | Ht 69.0 in | Wt 217.2 lb

## 2024-05-21 DIAGNOSIS — F3342 Major depressive disorder, recurrent, in full remission: Secondary | ICD-10-CM | POA: Diagnosis not present

## 2024-05-21 DIAGNOSIS — F429 Obsessive-compulsive disorder, unspecified: Secondary | ICD-10-CM | POA: Diagnosis not present

## 2024-05-21 MED ORDER — FLUVOXAMINE MALEATE 100 MG PO TABS: 100.0000 mg | ORAL_TABLET | Freq: Two times a day (BID) | ORAL | 0 refills | Status: DC

## 2024-05-21 NOTE — Patient Instructions (Signed)
 Continue fluvoxamine  100 mg twice a day  Next appointment: 10/23 at 2:30

## 2024-05-25 NOTE — Patient Instructions (Signed)

## 2024-05-30 ENCOUNTER — Ambulatory Visit: Admitting: Nurse Practitioner

## 2024-05-30 ENCOUNTER — Encounter: Payer: Self-pay | Admitting: Nurse Practitioner

## 2024-05-30 VITALS — BP 126/77 | HR 69 | Temp 97.6°F | Ht 69.0 in | Wt 214.4 lb

## 2024-05-30 DIAGNOSIS — E559 Vitamin D deficiency, unspecified: Secondary | ICD-10-CM | POA: Diagnosis not present

## 2024-05-30 DIAGNOSIS — D696 Thrombocytopenia, unspecified: Secondary | ICD-10-CM | POA: Diagnosis not present

## 2024-05-30 DIAGNOSIS — I5022 Chronic systolic (congestive) heart failure: Secondary | ICD-10-CM | POA: Diagnosis not present

## 2024-05-30 DIAGNOSIS — E785 Hyperlipidemia, unspecified: Secondary | ICD-10-CM

## 2024-05-30 DIAGNOSIS — Z7984 Long term (current) use of oral hypoglycemic drugs: Secondary | ICD-10-CM

## 2024-05-30 DIAGNOSIS — F332 Major depressive disorder, recurrent severe without psychotic features: Secondary | ICD-10-CM | POA: Diagnosis not present

## 2024-05-30 DIAGNOSIS — E1169 Type 2 diabetes mellitus with other specified complication: Secondary | ICD-10-CM | POA: Diagnosis not present

## 2024-05-30 DIAGNOSIS — E1165 Type 2 diabetes mellitus with hyperglycemia: Secondary | ICD-10-CM | POA: Diagnosis not present

## 2024-05-30 DIAGNOSIS — Z79899 Other long term (current) drug therapy: Secondary | ICD-10-CM

## 2024-05-30 DIAGNOSIS — I483 Typical atrial flutter: Secondary | ICD-10-CM

## 2024-05-30 DIAGNOSIS — N400 Enlarged prostate without lower urinary tract symptoms: Secondary | ICD-10-CM | POA: Diagnosis not present

## 2024-05-30 DIAGNOSIS — E538 Deficiency of other specified B group vitamins: Secondary | ICD-10-CM | POA: Diagnosis not present

## 2024-05-30 DIAGNOSIS — E119 Type 2 diabetes mellitus without complications: Secondary | ICD-10-CM | POA: Diagnosis not present

## 2024-05-30 DIAGNOSIS — N138 Other obstructive and reflux uropathy: Secondary | ICD-10-CM

## 2024-05-30 DIAGNOSIS — F422 Mixed obsessional thoughts and acts: Secondary | ICD-10-CM

## 2024-05-30 DIAGNOSIS — N401 Enlarged prostate with lower urinary tract symptoms: Secondary | ICD-10-CM

## 2024-05-30 LAB — BAYER DCA HB A1C WAIVED: HB A1C (BAYER DCA - WAIVED): 8.7 % — ABNORMAL HIGH (ref 4.8–5.6)

## 2024-05-30 MED ORDER — TAMSULOSIN HCL 0.4 MG PO CAPS: 0.4000 mg | ORAL_CAPSULE | Freq: Every day | ORAL | 3 refills | Status: AC

## 2024-05-30 MED ORDER — RYBELSUS 7 MG PO TABS: 7.0000 mg | ORAL_TABLET | Freq: Every day | ORAL | 6 refills | Status: DC

## 2024-05-30 MED ORDER — RYBELSUS 3 MG PO TABS: 3.0000 mg | ORAL_TABLET | Freq: Every day | ORAL | Status: DC

## 2024-05-30 NOTE — Assessment & Plan Note (Signed)
 Ongoing.  Noted on labs, however had improved on recheck.  Will continue to monitor, check CBC every 6 months.

## 2024-05-30 NOTE — Progress Notes (Signed)
 BP 126/77   Pulse 69   Temp 97.6 F (36.4 C) (Oral)   Ht 5' 9 (1.753 m)   Wt 214 lb 6.4 oz (97.3 kg)   SpO2 97%   BMI 31.66 kg/m    Subjective:    Patient ID: Dillon [name], male    DOB: February 06, 1951, 73 y.o.   MRN: [id number]  HPI: Dillon [name] is a 73 y.o. male  Chief Complaint  Patient presents with   Anxiety   Diabetes   Hyperlipidemia   Hypertension   DIABETES Diagnosed in December 2022 when A1c was 9.9% at the time. In May A1c was 7.6%. Taking Jardiance 25 MG daily and Januvia 100 MG daily.  To be taking Vitamin B12 and D daily for past low levels, but admits is not always consistent with this. He has been trying to eat less fast food, trying to eat more chicken and salads.  Metformin caused issues with frequent diarrhea, had to stop in past due to this.   Hypoglycemic episodes:no Polydipsia/polyuria: none Visual disturbance: no Chest pain: no Paresthesias: no Glucose Monitoring: yes  Accucheck frequency: occasional  Fasting glucose:   Post prandial:  Evening:  Before meals: Taking Insulin?: no  Long acting insulin:  Short acting insulin: Blood Pressure Monitoring: not checking Retinal Examination: Not Up To Date -- macular degeneration, [facility] -- needs to schedule Foot Exam: Up to Date Pneumovax: Not up to Date - refuses vaccines Influenza: Not up to Date - refuses vaccines Aspirin: no   ATRIAL FLUTTER & HEART FAILURE Takes Eliquis, Metoprolol, and Diltiazem. EF on 08/15/23 was 20-25%.  Saw cardiology on 12/27/23 and HF clinic 08/24/23.  Cardioversion/ablation done 08/15/23. He self stopped Amiodarone in past. Atrial fibrillation status: stable Satisfied with current treatment: yes  Medication side effects:  no Medication compliance: good compliance Etiology of atrial fibrillation: unknown Palpitations:  no Chest pain:  no Dyspnea on exertion:  no Orthopnea:  no -- sleeps with one pillow at baseline Syncope:  no Edema:  at sock line  on occasion Ventricular rate control: B-blocker Anti-coagulation: long acting   HYPERTENSION / HYPERLIPIDEMIA Continues to take Lisinopril, Cardizem, Lasix, Metoprolol, Amiodarone, + Crestor.  Occasional low platelets on past labs. Satisfied with current treatment? yes Duration of hypertension: chronic BP monitoring frequency: not checking BP range:  BP medication side effects: no Past BP meds:  Duration of hyperlipidemia: chronic Aspirin: no Recent stressors: no Recurrent headaches: no Visual changes: no Palpitations: no Dyspnea: no Chest pain: no Lower extremity edema: no Dizzy/lightheaded: no   BPH Continues on Flomax. Last saw urology 03/29/23. BPH status: stable Satisfied with current treatment?: yes Medication side effects: no Medication compliance: good compliance Duration: chronic Nocturia: 1-2x per night drinks a lot of water Urinary frequency:sometimes Incomplete voiding: no Urgency: sometimes Weak urinary stream: no Straining to start stream: no Dysuria: no Onset: gradual Severity: moderate Alleviating factors: Flomax Aggravating factors: drinking a lot of water Treatments attempted: as above AUA symptom score: 5 IPSS Questionnaire (AUA-7): Over the past month.   1)  How often have you had a sensation of not emptying your bladder completely after you finish urinating?  1 - Less than 1 time in 5  2)  How often have you had to urinate again less than two hours after you finished urinating? 1 - Less than 1 time in 5  3)  How often have you found you stopped and started again several times when you urinated?  0 - Not  at all  4) How difficult have you found it to postpone urination?  1 - Less than 1 time in 5  5) How often have you had a weak urinary stream?  0 - Not at all  6) How often have you had to push or strain to begin urination?  0 - Not at all  7) How many times did you most typically get up to urinate from the time you went to bed until the time you  got up in the morning?  2 - 2 times  Total score:  0-7 mildly symptomatic   8-19 moderately symptomatic   20-35 severely symptomatic    OCD History of ECT treatments (March 2024).  Taking Fluvoxamine .  Was to have follow-up on 11/30/23 with psychiatry, but did not attend. He did go to visit on 05/21/24 with no changes made. Wife died in their bed so he will never sleep there again, sleeps on couch. Mood status: stable Satisfied with current treatment?: no Symptom severity: moderate Duration of current treatment : chronic Side effects: no Medication compliance: good compliance Psychotherapy/counseling: yes in the past Depressed mood: no Anxious mood: no Anhedonia: no Significant weight loss or gain: no Insomnia: rarely Fatigue: no Feelings of worthlessness or guilt: no Impaired concentration/indecisiveness: no Suicidal ideations: no Hopelessness: no Crying spells: no    05/30/2024   10:58 AM 03/05/2024   10:54 AM 02/26/2024   10:06 AM 11/21/2023    9:50 AM 08/14/2023   10:02 AM  Depression screen PHQ 2/9  Decreased Interest 0 0 0 0 0  Down, Depressed, Hopeless 0 0 0 0 0  PHQ - 2 Score 0 0 0 0 0  Altered sleeping 0 0 0 0 0  Tired, decreased energy 0 0 0 0 0  Change in appetite 0 0 0 0 0  Feeling bad or failure about yourself  0 0 0 0 0  Trouble concentrating 0 0 0 0 0  Moving slowly or fidgety/restless 0 0 0 0 0  Suicidal thoughts 0 0 0 0 0  PHQ-9 Score 0 0 0 0 0  Difficult doing work/chores Not difficult at all Not difficult at all  Not difficult at all        05/30/2024   10:58 AM 02/26/2024   10:07 AM 11/21/2023    9:50 AM 08/14/2023   10:02 AM  GAD 7 : Generalized Anxiety Score  Nervous, Anxious, on Edge 0 0 1 0  Control/stop worrying 0 0 0 0  Worry too much - different things 0 0 0 0  Trouble relaxing 0 0 0 0  Restless 0 0 0 0  Easily annoyed or irritable 0 0 0 0  Afraid - awful might happen 0 0 0 0  Total GAD 7 Score 0 0 1 0  Anxiety Difficulty Not difficult at  all  Not difficult at all    Relevant past medical, surgical, family and social history reviewed and updated as indicated. Interim medical history since our last visit reviewed. Allergies and medications reviewed and updated.  Review of Systems  Constitutional:  Negative for activity change, diaphoresis, fatigue and fever.  Respiratory:  Negative for cough, chest tightness, shortness of breath and wheezing.   Cardiovascular:  Negative for chest pain, palpitations and leg swelling.  Gastrointestinal: Negative.   Neurological: Negative.   Psychiatric/Behavioral:  Negative for decreased concentration, self-injury, sleep disturbance and suicidal ideas. The patient is not nervous/anxious.     Per HPI unless specifically indicated above  Objective:    BP 126/77   Pulse 69   Temp 97.6 F (36.4 C) (Oral)   Ht 5' 9 (1.753 m)   Wt 214 lb 6.4 oz (97.3 kg)   SpO2 97%   BMI 31.66 kg/m   Wt Readings from Last 3 Encounters:  05/30/24 214 lb 6.4 oz (97.3 kg)  05/21/24 217 lb 3.2 oz (98.5 kg)  03/05/24 215 lb (97.5 kg)    Physical Exam Vitals and nursing note reviewed.  Constitutional:      General: He is awake. He is not in acute distress.    Appearance: He is well-developed and well-groomed. He is obese. He is not ill-appearing or toxic-appearing.  HENT:     Head: Normocephalic.     Right Ear: Hearing and external ear normal.     Left Ear: Hearing and external ear normal.  Eyes:     General: Lids are normal.     Extraocular Movements: Extraocular movements intact.     Conjunctiva/sclera: Conjunctivae normal.  Neck:     Thyroid: No thyromegaly.     Vascular: No carotid bruit.  Cardiovascular:     Rate and Rhythm: Normal rate and regular rhythm.     Heart sounds: Normal heart sounds. No murmur heard.    No gallop.  Pulmonary:     Effort: No accessory muscle usage or respiratory distress.     Breath sounds: Normal breath sounds.  Abdominal:     General: Bowel sounds are  normal. There is no distension.     Palpations: Abdomen is soft.     Tenderness: There is no abdominal tenderness.  Musculoskeletal:     Cervical back: Full passive range of motion without pain.     Right lower leg: No edema.     Left lower leg: No edema.  Lymphadenopathy:     Cervical: No cervical adenopathy.  Skin:    General: Skin is warm.     Capillary Refill: Capillary refill takes less than 2 seconds.  Neurological:     Mental Status: He is alert and oriented to person, place, and time.     Deep Tendon Reflexes: Reflexes are normal and symmetric.     Reflex Scores:      Brachioradialis reflexes are 2+ on the right side and 2+ on the left side.      Patellar reflexes are 2+ on the right side and 2+ on the left side. Psychiatric:        Attention and Perception: Attention normal.        Mood and Affect: Mood normal.        Speech: Speech normal.        Behavior: Behavior normal. Behavior is cooperative.        Thought Content: Thought content normal.       03/05/2024   11:00 AM 04/14/2023   10:39 AM 02/28/2023   11:34 AM 02/10/2022   11:05 AM 02/08/2021   11:24 AM  6CIT Screen  What Year? 0 points 0 points 0 points 0 points 0 points  What month? 3 points 0 points 0 points 0 points 0 points  What time? 0 points 0 points 0 points 0 points 0 points  Count back from 20 0 points 0 points 0 points 0 points 0 points  Months in reverse 0 points 0 points 2 points 2 points 0 points  Repeat phrase 4 points 0 points 0 points 4 points 4 points  Total Score 7 points 0 points  2 points 6 points 4 points   Results for orders placed or performed in visit on 05/30/24  Bayer DCA Hb A1c Waived   Collection Time: 05/30/24 11:00 AM  Result Value Ref Range   HB A1C (BAYER DCA - WAIVED) 8.7 (H) 4.8 - 5.6 %      Assessment & Plan:   Problem List Items Addressed This Visit       Cardiovascular and Mediastinum   Typical atrial flutter (HCC)   Diagnosed on 07/17/23.  Following with  cardiology with successful cardioversion on 08/15/23.  Continue collaboration with cardiology and current medication regimen as ordered.  Recommend he bring medications to all visits.      Relevant Orders   AMB Referral [facility] Care Management   Chronic systolic heart failure (HCC)   Ongoing, followed by cardiology and HF clinic.  Suspect related to Atrial Flutter which was diagnosed in October 2024.  Continue current medication regimen and adjust as needed.  Euvolemic today. - Reminded to call for an overnight weight gain of >2 pounds or a weekly weight gain of >5 pounds - not adding salt to food and read food labels. Reviewed the importance of keeping daily sodium intake to 2000mg  daily.  - Avoid Ibuprofen products - Highly recommend he ensure to follow-up with cardiology.      Relevant Orders   AMB Referral [facility] Care Management     Endocrine   Type 2 diabetes mellitus with hyperglycemia, without long-term current use of insulin  [facility] - Primary   Diagnosed December 2022, A1c 9.9% at the time.  A1c 8.7% today, continuing to trend up, and urine ALB 02 December 2023.  Did not tolerate Metformin  in past, severe diarrhea.  - Continue Jardiance  25 MG. Stop Januvia  and will change to Rybelsus , 3 MG tablet sample given in office today and educated on medication and side effects to report.  He would prefer not to use injections unless needed, we discussed that we may need to change to injection form of Semaglutide  and may even need insulin  in the future.   - Recommend he check BS at home daily and document for next visit so provider can review, gave him a log to fill out.  Focus on diabetic diet.  LABS: CMP, A1c.  Bring all pill bottles to next visit.  - Referral to [facility] PharmD as suspect we will need assistance with meds in future and would benefit team approach with pharmacist - Refuses vaccines - Foot exam up to date and needs eye exam -- recommend he schedule ASAP - ACE and statin on board       Relevant Medications   Semaglutide  (RYBELSUS ) 3 MG TABS   Semaglutide  (RYBELSUS ) 7 MG TABS (Start on 06/28/2024)   Other Relevant Orders   Bayer DCA Hb A1c Waived (Completed)   AMB Referral VBCI Care Management   Hyperlipidemia associated with type 2 diabetes mellitus (HCC)   Chronic, ongoing.  Will continue Rosuvastatin  10 MG daily as is tolerating and monitor, adjust as needed.  Lipid panel and CMP today.      Relevant Medications   Semaglutide  (RYBELSUS ) 3 MG TABS   Semaglutide  (RYBELSUS ) 7 MG TABS (Start on 06/28/2024)   Other Relevant Orders   Bayer DCA Hb A1c Waived (Completed)   Comprehensive metabolic panel with GFR   Lipid Panel w/o Chol/HDL Ratio   AMB Referral [facility]BCI Care Management   Diabetes mellitus treated with oral medication (HCC)   Refer to diabetes with hyperglycemia plan of  care.      Relevant Medications   Semaglutide  (RYBELSUS ) 3 MG TABS   Semaglutide  (RYBELSUS ) 7 MG TABS (Start on 06/28/2024)   Other Relevant Orders   Bayer DCA Hb A1c Waived (Completed)   AMB Referral [facility]BCI Care Management     Genitourinary   Benign localized hyperplasia of prostate with urinary obstruction   Chronic, ongoing.  Controlled with Flomax .  Continue this regimen and collaboration with urology. PSA on labs today.      Relevant Medications   tamsulosin  (FLOMAX ) 0.4 MG CAPS capsule   Other Relevant Orders   PSA     Hematopoietic and Hemostatic   Thrombocytopenia (HCC)   Ongoing.  Noted on labs, however had improved on recheck.  Will continue to monitor, check CBC every 6 months.      Relevant Orders   CBC with Differential/Platelet     Other   Vitamin D  deficiency   Chronic, ongoing.  Not consistently taking supplement.  Recommend he take weekly as ordered. Check level today.      Relevant Orders   VITAMIN D  25 Hydroxy (Vit-D Deficiency, Fractures)   Vitamin B12 deficiency   Chronic, ongoing.  Not consistently taking supplement.  Recommend he take daily as ordered.  Check level today.      Relevant Orders   Vitamin B12   Severe recurrent major depression without psychotic features (HCC)   Chronic, ongoing.  Denies SI/HI.  Overall mood stable per patient.  Will continue current medication regimen and collaboration with psychiatry.  Obtain EKG annually for psychiatry, due next October 2025.        Follow up plan: Return in about 4 weeks (around 06/27/2024) for T2DM -- started Rybelsus  and stopped Januvia .

## 2024-05-30 NOTE — Assessment & Plan Note (Signed)
 Chronic, ongoing.  Not consistently taking supplement.  Recommend he take weekly as ordered. Check level today.

## 2024-05-30 NOTE — Assessment & Plan Note (Signed)
 Diagnosed December 2022, A1c 9.9% at the time.  A1c 8.7% today, continuing to trend up, and urine ALB 02 December 2023.  Did not tolerate Metformin  in past, severe diarrhea.  - Continue Jardiance  25 MG. Stop Januvia  and will change to Rybelsus , 3 MG tablet sample given in office today and educated on medication and side effects to report.  He would prefer not to use injections unless needed, we discussed that we may need to change to injection form of Semaglutide  and may even need insulin  in the future.   - Recommend he check BS at home daily and document for next visit so provider can review, gave him a log to fill out.  Focus on diabetic diet.  LABS: CMP, A1c.  Bring all pill bottles to next visit.  - Referral to [facility] PharmD as suspect we will need assistance with meds in future and would benefit team approach with pharmacist - Refuses vaccines - Foot exam up to date and needs eye exam -- recommend he schedule ASAP - ACE and statin on board

## 2024-05-30 NOTE — Assessment & Plan Note (Signed)
 Chronic, ongoing.  Will continue Rosuvastatin  10 MG daily as is tolerating and monitor, adjust as needed.  Lipid panel and CMP today.

## 2024-05-30 NOTE — Assessment & Plan Note (Signed)
 Diagnosed on 07/17/23.  Following with cardiology with successful cardioversion on 08/15/23.  Continue collaboration with cardiology and current medication regimen as ordered.  Recommend he bring medications to all visits.

## 2024-05-30 NOTE — Assessment & Plan Note (Signed)
 Ongoing, followed by cardiology and HF clinic.  Suspect related to Atrial Flutter which was diagnosed in October 2024.  Continue current medication regimen and adjust as needed.  Euvolemic today. - Reminded to call for an overnight weight gain of >2 pounds or a weekly weight gain of >5 pounds - not adding salt to food and read food labels. Reviewed the importance of keeping daily sodium intake to 2000mg  daily.  - Avoid Ibuprofen products - Highly recommend he ensure to follow-up with cardiology.

## 2024-05-30 NOTE — Assessment & Plan Note (Signed)
 Chronic, ongoing.  Denies SI/HI.  Overall mood stable per patient.  Will continue current medication regimen and collaboration with psychiatry.  Obtain EKG annually for psychiatry, due next October 2025.

## 2024-05-30 NOTE — Assessment & Plan Note (Signed)
 Chronic, ongoing.  Not consistently taking supplement.  Recommend he take daily as ordered. Check level today.

## 2024-05-30 NOTE — Assessment & Plan Note (Signed)
Chronic, ongoing.  Controlled with Flomax.  Continue this regimen and collaboration with urology. PSA on labs today.

## 2024-05-30 NOTE — Assessment & Plan Note (Signed)
Refer to diabetes with hyperglycemia plan of care.

## 2024-05-31 ENCOUNTER — Ambulatory Visit: Payer: Self-pay | Admitting: Nurse Practitioner

## 2024-05-31 DIAGNOSIS — R7989 Other specified abnormal findings of blood chemistry: Secondary | ICD-10-CM

## 2024-05-31 LAB — LIPID PANEL W/O CHOL/HDL RATIO
Cholesterol, Total: 154 mg/dL (ref 100–199)
HDL: 41 mg/dL (ref >39)
LDL Chol Calc (NIH): 74 mg/dL (ref 0–99)
Triglycerides: 235 mg/dL — ABNORMAL HIGH (ref 0–149)
VLDL Cholesterol Cal: 39 mg/dL (ref 5–40)

## 2024-05-31 LAB — COMPREHENSIVE METABOLIC PANEL WITH GFR
ALT: 34 IU/L (ref 0–44)
AST: 21 IU/L (ref 0–40)
Albumin: 4.5 g/dL (ref 3.8–4.8)
Alkaline Phosphatase: 122 IU/L — ABNORMAL HIGH (ref 44–121)
BUN/Creatinine Ratio: 19 (ref 10–24)
BUN: 21 mg/dL (ref 8–27)
Bilirubin Total: 0.9 mg/dL (ref 0.0–1.2)
CO2: 17 mmol/L — ABNORMAL LOW (ref 20–29)
Calcium: 10.3 mg/dL — ABNORMAL HIGH (ref 8.6–10.2)
Chloride: 105 mmol/L (ref 96–106)
Creatinine, Ser: 1.13 mg/dL (ref 0.76–1.27)
Globulin, Total: 2.1 g/dL (ref 1.5–4.5)
Glucose: 307 mg/dL — ABNORMAL HIGH (ref 70–99)
Potassium: 4.3 mmol/L (ref 3.5–5.2)
Sodium: 143 mmol/L (ref 134–144)
Total Protein: 6.6 g/dL (ref 6.0–8.5)
eGFR: 69 mL/min/1.73 (ref >59)

## 2024-05-31 LAB — CBC WITH DIFFERENTIAL/PLATELET
Basophils Absolute: 0 x10E3/uL (ref 0.0–0.2)
Basos: 0 %
EOS (ABSOLUTE): 0 x10E3/uL (ref 0.0–0.4)
Eos: 0 %
Hematocrit: 56.8 % — ABNORMAL HIGH (ref 37.5–51.0)
Hemoglobin: 19.1 g/dL — ABNORMAL HIGH (ref 13.0–17.7)
Immature Grans (Abs): 0.1 x10E3/uL (ref 0.0–0.1)
Immature Granulocytes: 1 %
Lymphocytes Absolute: 2.9 x10E3/uL (ref 0.7–3.1)
Lymphs: 26 %
MCH: 31 pg (ref 26.6–33.0)
MCHC: 33.6 g/dL (ref 31.5–35.7)
MCV: 92 fL (ref 79–97)
Monocytes Absolute: 0.7 x10E3/uL (ref 0.1–0.9)
Monocytes: 6 %
Neutrophils Absolute: 7.2 x10E3/uL — ABNORMAL HIGH (ref 1.4–7.0)
Neutrophils: 67 %
Platelets: 129 x10E3/uL — ABNORMAL LOW (ref 150–450)
RBC: 6.17 x10E6/uL — ABNORMAL HIGH (ref 4.14–5.80)
RDW: 13 % (ref 11.6–15.4)
WBC: 10.9 x10E3/uL — ABNORMAL HIGH (ref 3.4–10.8)

## 2024-05-31 LAB — VITAMIN D 25 HYDROXY (VIT D DEFICIENCY, FRACTURES): Vit D, 25-Hydroxy: 25.9 ng/mL — ABNORMAL LOW (ref 30.0–100.0)

## 2024-05-31 LAB — PSA: Prostate Specific Ag, Serum: 3.3 ng/mL (ref 0.0–4.0)

## 2024-05-31 LAB — VITAMIN B12: Vitamin B-12: 377 pg/mL (ref 232–1245)

## 2024-05-31 NOTE — Progress Notes (Signed)
 Good afternoon, please let Dillon [name] know his labs have returned: - Glucose was elevated in the 300 range, this goes along with trend up in his A1c.  Start the Rybelsus  as we discussed. If any issues with this immediately let me know. - Lipid panel overall stable with exception of triglycerides which remain a little elevated, ensure you are taking your Rosuvastatin  daily and at next visit we may increase dose. - CBC is showing elevation in white blood cell count and neutrophils + other abnormals.  I would like to recheck this when you return in 4 weeks to see if this improves.  Have you been sick recently? - Vitamin D  and B12 remain on lower side of normal, I do recommend starting supplements we have discussed for this.  Thank you:) Have a great day!! Keep being stellar!!  Thank you for allowing me to participate in your care.  I appreciate you. Kindest regards, Dillon [name]

## 2024-06-03 ENCOUNTER — Other Ambulatory Visit (HOSPITAL_COMMUNITY): Payer: Self-pay

## 2024-06-03 ENCOUNTER — Telehealth: Payer: Self-pay

## 2024-06-03 NOTE — Progress Notes (Signed)
 Care Guide Pharmacy Note  06/03/2024 Name: Dillon [name] MRN: [id number] DOB: 1951-09-09  Referred By: Valerio Melanie DASEN, NP Reason for referral: Complex Care Management (Outreach to schedule with Pharm d )   Dillon [name] is a 73 y.o. year old male who is a primary care patient of Cannady, Jolene T, NP.  Dillon [name] was referred to the pharmacist for assistance related to: DMII  Successful contact was made with the patient to discuss pharmacy services including being ready for the pharmacist to call at least 5 minutes before the scheduled appointment time and to have medication bottles and any blood pressure readings ready for review. The patient agreed to meet with the pharmacist via telephone visit on (date/time).06/07/2024  Dillon [name] , RMA     [place]  [facility], [facility] Guide  Direct Dial: [phone]  Website: [email or web address]

## 2024-06-03 NOTE — Telephone Encounter (Signed)
 Pharmacy Patient Advocate Encounter   Received notification from Onbase that prior authorization for Rybelsus  7MG  tablets is required/requested.   Insurance verification completed.   The patient is insured through [employer] .   Per test claim: PA required; PA submitted to above mentioned insurance via Latent Key/confirmation #/EOC [id number] Status is pending

## 2024-06-04 NOTE — Telephone Encounter (Signed)
 Pharmacy Patient Advocate Encounter  Received notification from [employer] that Prior Authorization for Rybelsus  7MG  tablets has been APPROVED from 10/18/2023 to 10/16/2024   PA #/Case ID/Reference #: [id number]

## 2024-06-04 NOTE — Progress Notes (Signed)
 Order in.

## 2024-06-07 ENCOUNTER — Other Ambulatory Visit: Payer: Self-pay

## 2024-06-07 ENCOUNTER — Telehealth: Payer: Self-pay

## 2024-06-07 NOTE — Telephone Encounter (Signed)
 Copied from CRM #[id number]. Topic: General - Other >> Jun 07, 2024 12:05 PM Debby BROCKS wrote: Reason for CRM: Returning a missed call. [facility] on Lunch Hour. Patient would like to know what the call was about

## 2024-06-07 NOTE — Progress Notes (Signed)
   06/07/2024  Patient ID: Dillon [name], male   DOB: 1951/04/26, 73 y.o.   MRN: [id number]  Outreach attempt for scheduled telephone visit to assist with management of diabetes was not successful, and patient does not have voicemail set up.  I attempted to call him twice with no answer and will ask care guide to try to contact to reschedule visit.   Patient recently switched from Januvia  100mg  daily to Rybelsus .  Prior authorization has been approved for Rybelsus  7mg  per chart notes, and patient was provided with 3mg  samples.  Reviewed medication list and pharmacy fill history, and patient does not appear to be taking the following medications listed:  Vitamin D , rosuvastatin  10mg , lisinopril  5mg , and diltiazem 30mg .  He also has not filled diabetic testing supplies in over a year, per pharmacy report.    I will also address medication adherence if/when telephone visit is rescheduled.  [name], PharmD, DPLA

## 2024-06-18 ENCOUNTER — Other Ambulatory Visit: Payer: Self-pay

## 2024-06-18 DIAGNOSIS — E1165 Type 2 diabetes mellitus with hyperglycemia: Secondary | ICD-10-CM

## 2024-06-18 NOTE — Progress Notes (Signed)
 06/18/2024 Name: Dillon [name] MRN: [id number] DOB: [birth year]  Chief Complaint  Patient presents with   Diabetes Management Plan   Dillon [name] is a 73 y.o. year old male who presented for a telephone visit.   They were referred to the pharmacist by their PCP for assistance in managing diabetes.   Subjective:  Care Team: Primary Care Provider: Valerio Melanie DASEN, NP ; Next Scheduled Visit: 10/2  Medication Access/Adherence  Current Pharmacy:  [id number] [place], [place] - [address] AT [facility] [address] [place] [address] Phone: [phone] Fax: [phone]  -Patient reports affordability concerns with their medications: No  -Patient reports access/transportation concerns to their pharmacy: No  -Patient reports adherence concerns with their medications:  No    Diabetes: Current medications: Rybelsus  3mg  daily, Jardiance  25mg  daily -Medications tried in the past: metformin  caused significant diarrhea -Januvia  was recently stopped and Rybelsus  started for additional A1c benefit (preferred oral tablet over weekly injection) -Patient was provided with Rybelsus  3mg  sample at visit on 8/14 and endorses he is tolerating this medication well, stating he feels better now than before he was taking the medication.  Does endorse some mild heart burn symptoms, but states this did not necessarily start after taking the Rybelsus . -A1c on 8/14 was elevated at 8.7% -Patient is checking home BG a couple of times throughout the day; recent readings include 196 yesterday afternoon, 139 before bed- states most readings are in the 200's -Patient denies hypoglycemic s/sx including dizziness, shakiness, sweating.  -Patient denies hyperglycemic symptoms including polyuria, polydipsia, polyphagia, nocturia, neuropathy, blurred vision. -Prescription for Rybelsus  7mg  was sent to the pharmacy for patient to pick up after completing 3mg  samples-  test claim reflects $0 copay for medication -Patient endorses good adherence to medications and no barriers to adherence; pharmacy fill report supports this in regard to Jardiance , but he does not appear to be taking lisinopril , vitamin D , diltiazem, or rosuvastatin  as prescribed  Macrovascular and Microvascular Risk Reduction:  Statin? yes (rosuvastatin  10mg ); last filled 4/21 for 90d supply ACEi/ARB? yes (lisinopril  5mg ); last filled December 2024 Last urinary albumin/creatinine ratio:  Lab Results  Component Value Date   MICRALBCREAT 23 11/21/2023   MICRALBCREAT >300 (H) 01/13/2023   MICRALBCREAT 114 (H) 12/31/2021   MICRALBCREAT 30-300 (H) 09/28/2021   Last eye exam:  Lab Results  Component Value Date   HMDIABEYEEXA No Retinopathy 11/22/2021   Last foot exam: No foot exam found Tobacco Use:  Tobacco Use: Low Risk  (05/30/2024)   Patient History    Smoking Tobacco Use: Never    Smokeless Tobacco Use: Never    Passive Exposure: Past   Objective:  Lab Results  Component Value Date   HGBA1C 8.7 (H) 05/30/2024   Lab Results  Component Value Date   CREATININE 1.13 05/30/2024   BUN 21 05/30/2024   NA 143 05/30/2024   K 4.3 05/30/2024   CL 105 05/30/2024   CO2 17 (L) 05/30/2024   Lab Results  Component Value Date   CHOL 154 05/30/2024   HDL 41 05/30/2024   LDLCALC 74 05/30/2024   TRIG 235 (H) 05/30/2024   CHOLHDL 4.6 03/27/2017   Medications Reviewed Today     Reviewed by Deanna Channing LABOR, RPH (Pharmacist) on 06/18/24 at 1339  Med List Status: <None>   Medication Order Taking? Sig Documenting Provider Last Dose Status Informant  apixaban  (ELIQUIS ) 5 MG TABS tablet [id number]  Take 1 tablet (5 mg total) by mouth 2 (two) times daily. Cannady, Jolene T, NP  Active   diltiazem (CARDIZEM) 30 MG tablet [id number]  Take 30 mg by mouth 4 (four) times daily. [provider]  Active   empagliflozin  (JARDIANCE ) 25 MG TABS tablet [id number] Yes Take 1 tablet (25 mg  total) by mouth daily before breakfast. Cannady, Jolene T, NP  Active   fluvoxaMINE  (LUVOX ) 100 MG tablet [id number]  Take 1 tablet (100 mg total) by mouth 2 (two) times daily. Vickey Mettle, MD  Active   glucose blood (ACCU-CHEK GUIDE) test strip [id number] Yes Use to check blood sugars 2-3 times daily with goals = <130 fasting in morning and <180 two hours after eating. Bring blood sugar log to appointments. Cannady, Jolene T, NP  Active Other  lisinopril  (ZESTRIL ) 5 MG tablet [id number]  Take 5 mg by mouth daily. [provider]  Active   metoprolol  succinate (TOPROL -XL) 100 MG 24 hr tablet [id number]  Take 1 tablet by mouth 2 (two) times daily. [provider]  Active   Multiple Vitamins-Minerals (ICAPS AREDS 2 PO) [id number]  Take 1 tablet by mouth 2 (two) times daily. [provider]  Active Self  rosuvastatin  (CRESTOR ) 10 MG tablet [id number]  Take 1 tablet (10 mg total) by mouth at bedtime. Cannady, Jolene T, NP  Active   Semaglutide  (RYBELSUS ) 3 MG TABS [id number] Yes Take 1 tablet (3 mg total) by mouth daily. Cannady, Jolene T, NP  Active   Semaglutide  (RYBELSUS ) 7 MG TABS [id number]  Take 1 tablet (7 mg total) by mouth daily.  Patient not taking: Reported on 06/07/2024   Cannady, Jolene T, NP  Active   tamsulosin  (FLOMAX ) 0.4 MG CAPS capsule [id number]  Take 1 capsule (0.4 mg total) by mouth daily after breakfast. Cannady, Jolene T, NP  Active   Vitamin D , Ergocalciferol , (DRISDOL ) 1.25 MG (50000 UNIT) CAPS capsule [id number]  Take 1 capsule (50,000 Units total) by mouth every 7 (seven) days. Valerio Melanie DASEN, NP  Active Pharmacy Records, Self           Assessment/Plan:   Diabetes: - Currently uncontrolled; goal A1c <7%. Cardiorenal risk reduction is optimized.. Blood pressure is at goal <130/80. LDL is not at goal of <70 - Reviewed goal A1c, goal fasting, and goal 2 hour post prandial glucose. Recommended to check glucose first thing in the morning before breakfast  and 2 hours after a meal daily - Recommend to continue Jardiance  25mg  daily and Rybelsus  3mg  daily.  Fill Rybelsus  7mg  at pharmacy after completing 3mg  tablets. -Will address adherence to lisinopril , rosuvastatin , diltiazem and vitamin D  at next visit  Follow Up Plan: 1 month  [name], PharmD, DPLA

## 2024-06-26 ENCOUNTER — Telehealth: Payer: Self-pay

## 2024-06-26 DIAGNOSIS — E1165 Type 2 diabetes mellitus with hyperglycemia: Secondary | ICD-10-CM | POA: Diagnosis not present

## 2024-06-26 DIAGNOSIS — Z7901 Long term (current) use of anticoagulants: Secondary | ICD-10-CM | POA: Diagnosis not present

## 2024-06-26 DIAGNOSIS — E782 Mixed hyperlipidemia: Secondary | ICD-10-CM | POA: Diagnosis not present

## 2024-06-26 DIAGNOSIS — I10 Essential (primary) hypertension: Secondary | ICD-10-CM | POA: Diagnosis not present

## 2024-06-26 DIAGNOSIS — I483 Typical atrial flutter: Secondary | ICD-10-CM | POA: Diagnosis not present

## 2024-06-26 NOTE — Telephone Encounter (Unsigned)
 Copied from CRM #[id number]. Topic: Clinical - Medication Question >> Jun 26, 2024  1:41 PM DeAngela L wrote: Reason for CRM: patient calling to make sure his new prescription has been called in to the pharmacy for pick up cause he is almost out of samples  Semaglutide  (RYBELSUS ) 7 MG TABS (Starting on 06/28/2024)  Pt num [phone] (M)

## 2024-06-27 NOTE — Telephone Encounter (Signed)
 Ok for E2C2 to review.  Please advise patient he should be able to pick up anytime after tomorrow.   Outpatient Medication Detail   Disp Refills Start End   Semaglutide  (RYBELSUS ) 7 MG TABS 30 tablet 6 06/28/2024 --   Sig - Route: Take 1 tablet (7 mg total) by mouth daily. - Oral   Patient not taking: Reported on 06/18/2024   Sent to pharmacy as: Semaglutide  (RYBELSUS ) 7 MG Tab   E-Prescribing Status: Receipt confirmed by pharmacy (05/30/2024 11:26 AM EDT)

## 2024-07-01 ENCOUNTER — Other Ambulatory Visit

## 2024-07-01 DIAGNOSIS — R7989 Other specified abnormal findings of blood chemistry: Secondary | ICD-10-CM

## 2024-07-02 ENCOUNTER — Ambulatory Visit: Payer: Self-pay | Admitting: Nurse Practitioner

## 2024-07-02 ENCOUNTER — Encounter: Payer: Self-pay | Admitting: Nurse Practitioner

## 2024-07-02 ENCOUNTER — Ambulatory Visit (INDEPENDENT_AMBULATORY_CARE_PROVIDER_SITE_OTHER): Admitting: Nurse Practitioner

## 2024-07-02 VITALS — BP 128/76 | HR 85 | Temp 98.1°F | Resp 15 | Ht 69.02 in | Wt 214.4 lb

## 2024-07-02 DIAGNOSIS — E1165 Type 2 diabetes mellitus with hyperglycemia: Secondary | ICD-10-CM | POA: Diagnosis not present

## 2024-07-02 LAB — CBC WITH DIFFERENTIAL/PLATELET
Basophils Absolute: 0 x10E3/uL (ref 0.0–0.2)
Basos: 0 %
EOS (ABSOLUTE): 0.1 x10E3/uL (ref 0.0–0.4)
Eos: 1 %
Hematocrit: 55.2 % — ABNORMAL HIGH (ref 37.5–51.0)
Hemoglobin: 18.3 g/dL — ABNORMAL HIGH (ref 13.0–17.7)
Immature Grans (Abs): 0 x10E3/uL (ref 0.0–0.1)
Immature Granulocytes: 0 %
Lymphocytes Absolute: 3.4 x10E3/uL — ABNORMAL HIGH (ref 0.7–3.1)
Lymphs: 35 %
MCH: 31.2 pg (ref 26.6–33.0)
MCHC: 33.2 g/dL (ref 31.5–35.7)
MCV: 94 fL (ref 79–97)
Monocytes Absolute: 0.7 x10E3/uL (ref 0.1–0.9)
Monocytes: 7 %
Neutrophils Absolute: 5.4 x10E3/uL (ref 1.4–7.0)
Neutrophils: 57 %
Platelets: 135 x10E3/uL — ABNORMAL LOW (ref 150–450)
RBC: 5.86 x10E6/uL — ABNORMAL HIGH (ref 4.14–5.80)
RDW: 13.3 % (ref 11.6–15.4)
WBC: 9.6 x10E3/uL (ref 3.4–10.8)

## 2024-07-02 NOTE — Patient Instructions (Signed)
 Diabetes Action Plan A diabetes action plan is a way for you to manage your symptoms of diabetes, also called diabetes mellitus. The plan is color-coded to guide you on what actions to take based on any symptoms you're having. If you have symptoms in the red zone, you need medical care right away. If you have symptoms in the yellow zone, your diabetes isn't under control, and you may need to make some changes. If you have symptoms in the green zone, you're doing well. Understanding diabetes can take time. Follow the treatment plan that you created with your health care provider. Know the target range for your blood sugar, also called glucose. Review your plan each time you visit your provider. The target range for my blood sugar level is __________________________ mg/dL. Red zone Get medical help right away if you have any of the following symptoms: A blood sugar test result that's below 54 mg/dL (3 mmol/L). A blood sugar test result that's at or above 240 mg/dL (16.1 mmol/L) for 2 days in a row along with: Extreme thirst and frequent peeing. Confusion or trouble thinking clearly. Moderate or large ketone levels in your pee (urine). Feeling tired or having no energy. Trouble breathing. Sickness or a fever for 2 or more days that's not getting better. These symptoms may be an emergency. Call 911 right away. Do not wait to see if the symptoms will go away. Do not drive yourself to the hospital. If you have very low blood sugar, also called severe hypoglycemia, and you can't eat or drink, you may need glucagon. Make sure a family member or close friend knows how to check your blood sugar and how to give you glucagon. You may need to be treated in a hospital for this condition. Yellow zone If you have any of the following symptoms, your diabetes isn't under control, and you may need to make some changes: A blood sugar test result that's at or above 240 mg/dL (09.6 mmol/L) for 2 days in a  row. Blood sugar test results that are below 70 mg/dL (3.9 mmol/L). Other symptoms of hypoglycemia, such as: Shaking or feeling light-headed. Confusion or irritability. Feeling hungry. Having a fast heartbeat. If you have any yellow zone symptoms: Treat your hypoglycemia by eating or drinking 15 grams of a rapid-acting carbohydrate. Follow the 15:15 rule: Take 15 grams of a rapid-acting carbohydrate, such as: 1 tube of glucose gel. 4 glucose pills. 4 oz (120 mL) of fruit juice. 4 oz (120 mL) of regular (not diet) soda. Check your blood sugar again 15 minutes after you take the carbohydrate. If the second blood sugar test is still at or below 70 mg/dL (3.9 mmol/L), take 15 grams of a carbohydrate again. If your blood sugar doesn't increase above 70 mg/dL (3.9 mmol/L) after 3 tries, get medical help right away. After your blood sugar returns to normal, eat a meal or a snack within 1 hour. Keep taking your daily medicines as told by your provider. Check your blood sugar more often than you normally would. Write down your results. Call your provider if you have trouble keeping your blood sugar in your target range. Green zone These signs mean you're doing well and can continue what you're doing to manage your diabetes: Your blood sugar is within your personal target range. For most people, a blood sugar level before a meal should be 80-130 mg/dL (0.4-5.4 mmol/L). You feel well, and you're able to do daily activities. If you're in the green zone,  continue to manage your diabetes as told by your provider. To do this: Eat a healthy diet. Exercise regularly. Check your blood sugar as told. Take your medicines only as told. Where to find more information American Diabetes Association (ADA): diabetes.org Association of Diabetes Care & Education Specialists (ADCES): adces.org/diabetes-education-dsmes This information is not intended to replace advice given to you by your health care provider.  Make sure you discuss any questions you have with your health care provider. Document Revised: 05/24/2023 Document Reviewed: 05/24/2023 Elsevier Patient Education  2024 [employer].

## 2024-07-02 NOTE — Assessment & Plan Note (Signed)
 Diagnosed December 2022, A1c 9.9% at the time.  A1c 8.7% in August, continuing to trend up, and urine ALB 02 December 2023.  Did not tolerate Metformin  in past, severe diarrhea. Tolerating Rybelsus  well and sugars slowly trending down. - Continue Jardiance  25 MG. He will pick up 7 MG Rybelsus  tomorrow and is aware if any issues to let us  know.  He would prefer not to use injections unless needed, we discussed that we may need to change to injection form of Semaglutide  and may even need insulin  in the future.   - Recommend he check BS at home daily and document for next visit so provider can review, gave him a log to fill out.  Focus on diabetic diet.  LABS: up to date.  Bring all pill bottles to next visit.  - Collaborate with [place] PharmD - Refuses vaccines - Foot exam up to date and needs eye exam -- recommend he schedule ASAP - ACE and statin on board

## 2024-07-02 NOTE — Progress Notes (Signed)
 Will discuss with him at visit this afternoon

## 2024-07-02 NOTE — Progress Notes (Signed)
 BP 128/76 (BP Location: Left Arm, Patient Position: Sitting, Cuff Size: Normal)   Pulse 85   Temp 98.1 F (36.7 C) (Oral)   Resp 15   Ht 5' 9.02 (1.753 m)   Wt 214 lb 6.4 oz (97.3 kg)   SpO2 97%   BMI 31.65 kg/m    Subjective:    Patient ID: Dillon [name], male    DOB: [birth year], 73 y.o.   MRN: [id number]  HPI: Dillon [name] is a 73 y.o. male  Chief Complaint  Patient presents with   Diabetes    Stopped Januvia  and started Rybelsus . He says he does feel better overall. Is aware to pick up 7 mg for $0 copay and call office tomorrow if he is asked to pay.    DIABETES Started on Rybelsus  and stopped Januvia .  Is tolerating this. A1c at visit in August 8.7%. He reports he has been cutting back on meals, eating more in moderation.  Continues Jardiance  25 MG daily. Hypoglycemic episodes:no Polydipsia/polyuria: no Visual disturbance: no Chest pain: no Paresthesias: no Glucose Monitoring: yes  Accucheck frequency: BID range 163 to 264  Fasting glucose:  Post prandial:  Evening:  Before meals: Taking Insulin ?: no  Long acting insulin :  Short acting insulin : Blood Pressure Monitoring: not checking Retinal Examination: Not up to Date Foot Exam: Up to Date Diabetic Education: Not Completed Pneumovax: refuses Influenza: refuses Aspirin: no   Relevant past medical, surgical, family and social history reviewed and updated as indicated. Interim medical history since our last visit reviewed. Allergies and medications reviewed and updated.  Review of Systems  Constitutional:  Negative for activity change, diaphoresis, fatigue and fever.  Respiratory:  Negative for cough, chest tightness, shortness of breath and wheezing.   Cardiovascular:  Negative for chest pain, palpitations and leg swelling.  Gastrointestinal: Negative.   Neurological: Negative.   Psychiatric/Behavioral:  Negative for decreased concentration, self-injury, sleep disturbance and suicidal ideas.  The patient is not nervous/anxious.     Per HPI unless specifically indicated above     Objective:    BP 128/76 (BP Location: Left Arm, Patient Position: Sitting, Cuff Size: Normal)   Pulse 85   Temp 98.1 F (36.7 C) (Oral)   Resp 15   Ht 5' 9.02 (1.753 m)   Wt 214 lb 6.4 oz (97.3 kg)   SpO2 97%   BMI 31.65 kg/m   Wt Readings from Last 3 Encounters:  07/02/24 214 lb 6.4 oz (97.3 kg)  05/30/24 214 lb 6.4 oz (97.3 kg)  03/05/24 215 lb (97.5 kg)    Physical Exam Vitals and nursing note reviewed.  Constitutional:      General: He is awake. He is not in acute distress.    Appearance: He is well-developed and well-groomed. He is obese. He is not ill-appearing or toxic-appearing.  HENT:     Head: Normocephalic.     Right Ear: Hearing and external ear normal.     Left Ear: Hearing and external ear normal.  Eyes:     General: Lids are normal.     Extraocular Movements: Extraocular movements intact.     Conjunctiva/sclera: Conjunctivae normal.  Neck:     Thyroid : No thyromegaly.     Vascular: No carotid bruit.  Cardiovascular:     Rate and Rhythm: Normal rate and regular rhythm.     Heart sounds: Normal heart sounds. No murmur heard.    No gallop.  Pulmonary:     Effort: No accessory muscle usage  or respiratory distress.     Breath sounds: Normal breath sounds.  Abdominal:     General: Bowel sounds are normal. There is no distension.     Palpations: Abdomen is soft.     Tenderness: There is no abdominal tenderness.  Musculoskeletal:     Cervical back: Full passive range of motion without pain.     Right lower leg: No edema.     Left lower leg: No edema.  Lymphadenopathy:     Cervical: No cervical adenopathy.  Skin:    General: Skin is warm.     Capillary Refill: Capillary refill takes less than 2 seconds.  Neurological:     Mental Status: He is alert and oriented to person, place, and time.     Deep Tendon Reflexes: Reflexes are normal and symmetric.     Reflex  Scores:      Brachioradialis reflexes are 2+ on the right side and 2+ on the left side.      Patellar reflexes are 2+ on the right side and 2+ on the left side. Psychiatric:        Attention and Perception: Attention normal.        Mood and Affect: Mood normal.        Speech: Speech normal.        Behavior: Behavior normal. Behavior is cooperative.        Thought Content: Thought content normal.     Results for orders placed or performed in visit on 07/01/24  CBC with Differential/Platelet   Collection Time: 07/01/24  9:30 AM  Result Value Ref Range   WBC 9.6 3.4 - 10.8 x10E3/uL   RBC 5.86 (H) 4.14 - 5.80 x10E6/uL   Hemoglobin 18.3 (H) 13.0 - 17.7 g/dL   Hematocrit 44.7 (H) 62.4 - 51.0 %   MCV 94 79 - 97 fL   MCH 31.2 26.6 - 33.0 pg   MCHC 33.2 31.5 - 35.7 g/dL   RDW 86.6 88.3 - 84.5 %   Platelets 135 (L) 150 - 450 x10E3/uL   Neutrophils 57 Not Estab. %   Lymphs 35 Not Estab. %   Monocytes 7 Not Estab. %   Eos 1 Not Estab. %   Basos 0 Not Estab. %   Neutrophils Absolute 5.4 1.4 - 7.0 x10E3/uL   Lymphocytes Absolute 3.4 (H) 0.7 - 3.1 x10E3/uL   Monocytes Absolute 0.7 0.1 - 0.9 x10E3/uL   EOS (ABSOLUTE) 0.1 0.0 - 0.4 x10E3/uL   Basophils Absolute 0.0 0.0 - 0.2 x10E3/uL   Immature Granulocytes 0 Not Estab. %   Immature Grans (Abs) 0.0 0.0 - 0.1 x10E3/uL      Assessment & Plan:   Problem List Items Addressed This Visit       Endocrine   Type 2 diabetes mellitus with hyperglycemia, without long-term current use of insulin  (HCC) - Primary   Diagnosed December 2022, A1c 9.9% at the time.  A1c 8.7% in August, continuing to trend up, and urine ALB 02 December 2023.  Did not tolerate Metformin  in past, severe diarrhea. Tolerating Rybelsus  well and sugars slowly trending down. - Continue Jardiance  25 MG. He will pick up 7 MG Rybelsus  tomorrow and is aware if any issues to let us  know.  He would prefer not to use injections unless needed, we discussed that we may need to change to  injection form of Semaglutide  and may even need insulin  in the future.   - Recommend he check BS at home daily and document  for next visit so provider can review, gave him a log to fill out.  Focus on diabetic diet.  LABS: up to date.  Bring all pill bottles to next visit.  - Collaborate with [place] PharmD - Refuses vaccines - Foot exam up to date and needs eye exam -- recommend he schedule ASAP - ACE and statin on board        Follow up plan: Return in about 2 months (around 09/01/2024) for T2DM, HTN/HLD, Depression.

## 2024-07-14 NOTE — Progress Notes (Unsigned)
   07/15/2024 Name: Dillon [name] MRN: [id number] DOB: 07-Aug-1951  Dillon [name] is a 73 y.o. year old male who presented for a telephone visit.   They were referred to the pharmacist by their PCP for assistance in managing diabetes.   Subjective:  Care Team: Primary Care Provider: Valerio Melanie DASEN, NP ; Next Scheduled Visit: 11/18  Medication Access/Adherence  Current Pharmacy:  [facility] #[id number] - [place], [place] - [address] AT [facility] OF [address] [place] [address] Phone: [phone] Fax: [phone]  -Patient reports affordability concerns with their medications: No  -Patient reports access/transportation concerns to their pharmacy: No  -Patient reports adherence concerns with their medications:  No    Diabetes: Current medications: Rybelsus  7mg  daily, Jardiance  25mg  daily -Medications tried in the past: metformin  caused significant diarrhea -Januvia  was recently stopped and Rybelsus  started for additional A1c benefit (preferred oral tablet over weekly injection) -Patient completed Rybelsus  3mg  samples, and started Rybelsus  7mg  daily approximately 1 week ago.  He endorses tolerating medication well with no noticeable side effects at this time. -A1c on 8/14 was elevated at 8.7% -Patient is checking home BG on occasion and states these are usually just below 200 (fasting) -Patient denies hypoglycemic s/sx including dizziness, shakiness, sweating.  -Patient denies hyperglycemic symptoms including polyuria, polydipsia, polyphagia, nocturia, neuropathy, blurred vision. -Patient endorses good adherence to medications and no barriers to adherence; pharmacy fill report supports this in regard to Jardiance  and Rybelsus   Macrovascular and Microvascular Risk Reduction:  Statin? yes (rosuvastatin  10mg ) ACEi/ARB? yes (lisinopril  5mg ) Last urinary albumin/creatinine ratio:  Lab Results  Component Value Date   MICRALBCREAT 23  11/21/2023   MICRALBCREAT >300 (H) 01/13/2023   MICRALBCREAT 114 (H) 12/31/2021   MICRALBCREAT 30-300 (H) 09/28/2021   Last eye exam:  Lab Results  Component Value Date   HMDIABEYEEXA No Retinopathy 11/22/2021   Last foot exam: No foot exam found Tobacco Use:  Tobacco Use: Low Risk  (07/02/2024)   Patient History    Smoking Tobacco Use: Never    Smokeless Tobacco Use: Never    Passive Exposure: Past   Objective:  Lab Results  Component Value Date   HGBA1C 8.7 (H) 05/30/2024   Lab Results  Component Value Date   CREATININE 1.13 05/30/2024   BUN 21 05/30/2024   NA 143 05/30/2024   K 4.3 05/30/2024   CL 105 05/30/2024   CO2 17 (L) 05/30/2024   Lab Results  Component Value Date   CHOL 154 05/30/2024   HDL 41 05/30/2024   LDLCALC 74 05/30/2024   TRIG 235 (H) 05/30/2024   CHOLHDL 4.6 03/27/2017   Assessment/Plan:   Diabetes: - Currently uncontrolled; goal A1c <7%. Cardiorenal risk reduction is optimized.. Blood pressure is at goal <130/80. LDL is not at goal of <70 - Reviewed goal A1c, goal fasting, and goal 2 hour post prandial glucose. Recommended to check glucose first thing in the morning before breakfast and 2 hours after a meal daily - Continue current regimen at this time; patient will due for A1c at 11/18 PCP visit, and I recommend increasing Rybelsus  to 14mg  daily at this time if he has continued to tolerate 7mg  well  Follow Up Plan: 6-8 weeks  Dillon [name], PharmD, DPLA

## 2024-07-15 ENCOUNTER — Other Ambulatory Visit: Payer: Self-pay

## 2024-07-15 NOTE — Progress Notes (Signed)
   07/15/2024  Patient ID: Dillon [name], Dillon [name]   DOB: 09/06/1951, 73 y.o.   MRN: [id number]  Patient did not answer x2 for scheduled telephone visit, but ended up returning my call.  See visit note from 07/15/2024.  [name], PharmD, DPLA

## 2024-07-18 ENCOUNTER — Ambulatory Visit: Admitting: Nurse Practitioner

## 2024-08-03 ENCOUNTER — Other Ambulatory Visit: Payer: Self-pay | Admitting: Nurse Practitioner
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
## 2024-08-06 NOTE — Telephone Encounter (Signed)
 Called pharmacy - refills are availble.

## 2024-08-06 NOTE — Telephone Encounter (Signed)
 Requested Prescriptions  Refused Prescriptions Disp Refills   ELIQUIS  5 MG TABS tablet [Pharmacy Med Name: ELIQUIS  5MG  TABLETS] 180 tablet 3    Sig: TAKE 1 TABLET(5 MG) BY MOUTH TWICE DAILY     Hematology:  Anticoagulants - apixaban  Failed - 08/06/2024 10:12 AM      Failed - PLT in normal range and within 360 days    Platelets  Date Value Ref Range Status  07/01/2024 135 (L) 150 - 450 x10E3/uL Final         Failed - HGB in normal range and within 360 days    Hemoglobin  Date Value Ref Range Status  07/01/2024 18.3 (H) 13.0 - 17.7 g/dL Final         Failed - HCT in normal range and within 360 days    Hematocrit  Date Value Ref Range Status  07/01/2024 55.2 (H) 37.5 - 51.0 % Final         Passed - Cr in normal range and within 360 days    Creatinine  Date Value Ref Range Status  12/01/2013 1.17 0.60 - 1.30 mg/dL Final   Creatinine, Ser  Date Value Ref Range Status  05/30/2024 1.13 0.76 - 1.27 mg/dL Final         Passed - AST in normal range and within 360 days    AST  Date Value Ref Range Status  05/30/2024 21 0 - 40 IU/L Final         Passed - ALT in normal range and within 360 days    ALT  Date Value Ref Range Status  05/30/2024 34 0 - 44 IU/L Final         Passed - Valid encounter within last 12 months    Recent Outpatient Visits           1 month ago Type 2 diabetes mellitus with hyperglycemia, without long-term current use of insulin  (HCC)   [facility] [place], [place] T, NP   2 months ago Type 2 diabetes mellitus with hyperglycemia, without long-term current use of insulin  (HCC)   [facility] [place], [place] T, NP   5 months ago Type 2 diabetes mellitus with hyperglycemia, without long-term current use of insulin  (HCC)   [facility] [place], [place] T, NP   8 months ago Type 2 diabetes mellitus with hyperglycemia, without long-term current use of insulin  [facility])    [facility] [place], Melanie DASEN, NP

## 2024-08-08 ENCOUNTER — Ambulatory Visit: Admitting: Psychiatry

## 2024-08-08 ENCOUNTER — Other Ambulatory Visit: Payer: Self-pay | Admitting: Nurse Practitioner

## 2024-08-08 NOTE — Telephone Encounter (Signed)
 Copied from CRM #[id number]. Topic: Clinical - Medication Refill >> Aug 08, 2024  1:36 PM Rosaria E wrote: Medication: apixaban  (ELIQUIS ) 5 MG TABS tablet  Has the patient contacted their pharmacy? Yes (Agent: If no, request that the patient contact the pharmacy for the refill. If patient does not wish to contact the pharmacy document the reason why and proceed with request.) (Agent: If yes, when and what did the pharmacy advise?)  This is the patient's preferred pharmacy:  [id number] [place], [place] - [address] AT [facility] [address] [place] [address] Phone: [phone] Fax: [phone]  Is this the correct pharmacy for this prescription? Yes If no, delete pharmacy and type the correct one.   Has the prescription been filled recently? Yes  Is the patient out of the medication? Yes  Has the patient been seen for an appointment in the last year OR does the patient have an upcoming appointment? Yes  Can we respond through MyChart? Yes  Agent: Please be advised that Rx refills may take up to 3 business days. We ask that you follow-up with your pharmacy.

## 2024-08-09 NOTE — Telephone Encounter (Signed)
 Called pharmacy - pt has refills available - pharmacy will prepare refill.

## 2024-08-09 NOTE — Telephone Encounter (Signed)
 Requested Prescriptions  Refused Prescriptions Disp Refills   apixaban  (ELIQUIS ) 5 MG TABS tablet 180 tablet 3    Sig: Take 1 tablet (5 mg total) by mouth 2 (two) times daily.     Hematology:  Anticoagulants - apixaban  Failed - 08/09/2024  4:45 PM      Failed - PLT in normal range and within 360 days    Platelets  Date Value Ref Range Status  07/01/2024 135 (L) 150 - 450 x10E3/uL Final         Failed - HGB in normal range and within 360 days    Hemoglobin  Date Value Ref Range Status  07/01/2024 18.3 (H) 13.0 - 17.7 g/dL Final         Failed - HCT in normal range and within 360 days    Hematocrit  Date Value Ref Range Status  07/01/2024 55.2 (H) 37.5 - 51.0 % Final         Passed - Cr in normal range and within 360 days    Creatinine  Date Value Ref Range Status  12/01/2013 1.17 0.60 - 1.30 mg/dL Final   Creatinine, Ser  Date Value Ref Range Status  05/30/2024 1.13 0.76 - 1.27 mg/dL Final         Passed - AST in normal range and within 360 days    AST  Date Value Ref Range Status  05/30/2024 21 0 - 40 IU/L Final         Passed - ALT in normal range and within 360 days    ALT  Date Value Ref Range Status  05/30/2024 34 0 - 44 IU/L Final         Passed - Valid encounter within last 12 months    Recent Outpatient Visits           1 month ago Type 2 diabetes mellitus with hyperglycemia, without long-term current use of insulin  (HCC)   [facility] [place], [place] T, NP   2 months ago Type 2 diabetes mellitus with hyperglycemia, without long-term current use of insulin  (HCC)   [facility] [place], [place] T, NP   5 months ago Type 2 diabetes mellitus with hyperglycemia, without long-term current use of insulin  (HCC)   [place] [facility] [facility] [place], [place] T, NP   8 months ago Type 2 diabetes mellitus with hyperglycemia, without long-term current use of insulin  [facility])   [facility] [place], Melanie DASEN, NP

## 2024-08-31 NOTE — Patient Instructions (Signed)

## 2024-09-03 ENCOUNTER — Encounter: Payer: Self-pay | Admitting: Nurse Practitioner

## 2024-09-03 ENCOUNTER — Ambulatory Visit (INDEPENDENT_AMBULATORY_CARE_PROVIDER_SITE_OTHER): Admitting: Nurse Practitioner

## 2024-09-03 VITALS — BP 132/78 | HR 72 | Temp 98.6°F | Resp 17 | Ht 69.02 in | Wt 216.0 lb

## 2024-09-03 DIAGNOSIS — E66811 Obesity, class 1: Secondary | ICD-10-CM | POA: Diagnosis not present

## 2024-09-03 DIAGNOSIS — E1169 Type 2 diabetes mellitus with other specified complication: Secondary | ICD-10-CM | POA: Diagnosis not present

## 2024-09-03 DIAGNOSIS — E119 Type 2 diabetes mellitus without complications: Secondary | ICD-10-CM

## 2024-09-03 DIAGNOSIS — I483 Typical atrial flutter: Secondary | ICD-10-CM

## 2024-09-03 DIAGNOSIS — Z6831 Body mass index (BMI) 31.0-31.9, adult: Secondary | ICD-10-CM

## 2024-09-03 DIAGNOSIS — F422 Mixed obsessional thoughts and acts: Secondary | ICD-10-CM

## 2024-09-03 DIAGNOSIS — I5022 Chronic systolic (congestive) heart failure: Secondary | ICD-10-CM | POA: Diagnosis not present

## 2024-09-03 DIAGNOSIS — F332 Major depressive disorder, recurrent severe without psychotic features: Secondary | ICD-10-CM | POA: Diagnosis not present

## 2024-09-03 DIAGNOSIS — E785 Hyperlipidemia, unspecified: Secondary | ICD-10-CM

## 2024-09-03 DIAGNOSIS — Z7984 Long term (current) use of oral hypoglycemic drugs: Secondary | ICD-10-CM

## 2024-09-03 DIAGNOSIS — E6609 Other obesity due to excess calories: Secondary | ICD-10-CM

## 2024-09-03 DIAGNOSIS — E1165 Type 2 diabetes mellitus with hyperglycemia: Secondary | ICD-10-CM | POA: Diagnosis not present

## 2024-09-03 LAB — BAYER DCA HB A1C WAIVED: HB A1C (BAYER DCA - WAIVED): 7.9 % — ABNORMAL HIGH (ref 4.8–5.6)

## 2024-09-03 MED ORDER — LANCETS 33G MISC: 1.0000 | Freq: Every day | 11 refills | Status: DC

## 2024-09-03 MED ORDER — RYBELSUS 14 MG PO TABS
14.0000 mg | ORAL_TABLET | Freq: Every day | ORAL | 1 refills | Status: AC
Start: 2024-09-03 — End: ?

## 2024-09-03 MED ORDER — FLUVOXAMINE MALEATE 100 MG PO TABS: 100.0000 mg | ORAL_TABLET | Freq: Two times a day (BID) | ORAL | 0 refills | Status: AC

## 2024-09-03 MED ORDER — LANCET DEVICE MISC: 1.0000 | Freq: Every day | 1 refills | Status: AC

## 2024-09-03 NOTE — Assessment & Plan Note (Signed)
 Diagnosed December 2022, A1c 9.9% at the time.  A1c 7.9% today, trending down from 8.9% with Rybelsus  on board. Urine ALB 02 December 2023.  Did not tolerate Metformin  in past, severe diarrhea. Tolerating Rybelsus  well and sugars slowly trending down, will increase to max dose of 14 MG. Educated him on this, he will complete 7 MG tablets by taking two of these daily and then start 14 MG tablet. - Continue Jardiance  25 MG and Rybelsus .  He would prefer not to use injections unless needed, we discussed that we may need to change to injection form of Semaglutide  and may even need insulin  in the future.   - Recommend he check BS at home daily and document for next visit so provider can review, gave him a log to fill out last visit but he did not bring today.  Focus on diabetic diet.  LABS: CMP.  Bring all pill bottles to next visit.  - Collaborate with [place] PharmD - Refuses vaccines - Foot exam up to date and needs eye exam -- recommend he schedule ASAP - ACE and statin on board

## 2024-09-03 NOTE — Assessment & Plan Note (Addendum)
 Ongoing, followed by cardiology and HF clinic.  Suspect related to Atrial Flutter which was diagnosed in October 2024.  Continue current medication regimen and adjust as needed.  Euvolemic today. EF on 01/12/24 56% ,which is improved from previous 30 to 35%. - Reminded to call for an overnight weight gain of >2 pounds or a weekly weight gain of >5 pounds - not adding salt to food and read food labels. Reviewed the importance of keeping daily sodium intake to 2000mg  daily.  - Avoid Ibuprofen products - Highly recommend he ensure to follow-up with cardiology as scheduled.

## 2024-09-03 NOTE — Assessment & Plan Note (Signed)
 Chronic, ongoing.  Denies SI/HI.  Overall mood stable per patient.  Will continue current medication regimen and collaboration with psychiatry.  Obtain EKG annually for psychiatry, will perform at next visit, suspect had one recently with cardiology but not available on chart.

## 2024-09-03 NOTE — Assessment & Plan Note (Signed)
 Diagnosed on 07/17/23.  Following with cardiology with successful cardioversion on 08/15/23.  Continue collaboration with cardiology and current medication regimen as ordered.  Recommend he bring medications to all visits.

## 2024-09-03 NOTE — Assessment & Plan Note (Signed)
 Chronic, ongoing.  Will continue Rosuvastatin  10 MG daily as is tolerating and monitor, adjust as needed.  Lipid panel and CMP today.

## 2024-09-03 NOTE — Assessment & Plan Note (Signed)
 BMI 31.88, no loss at present.  Recommended eating smaller high protein, low fat meals more frequently and exercising 30 mins a day 5 times a week with a goal of 10-15lb weight loss in the next 3 months. Patient voiced their understanding and motivation to adhere to these recommendations.

## 2024-09-03 NOTE — Progress Notes (Signed)
 BP 132/78 (BP Location: Left Arm, Patient Position: Sitting, Cuff Size: Normal)   Pulse 72   Temp 98.6 F (37 C) (Oral)   Resp 17   Ht 5' 9.02 (1.753 m)   Wt 216 lb (98 kg)   SpO2 98%   BMI 31.88 kg/m    Subjective:    Patient ID: Dillon [name], male    DOB: Nov 06, 1950, 73 y.o.   MRN: [id number]  HPI: Dillon [name] is a 73 y.o. male  Chief Complaint  Patient presents with   Diabetes    Feels he is doing well for the most part.    DIABETES Diagnosed in December 2022 when A1c was 9.9% at the time. A1c August 8.7%. Takes Jardiance  25 MG daily and Rybelsus  7 MG daily. Not consistently taking B12 and Vit D for his past low levels.  Has been trying to eat less fast food and more salads. Appetite has not changed much with Rybelsus .  Metformin  caused issues with frequent diarrhea, had to stop in past due to this.   Hypoglycemic episodes:no Polydipsia/polyuria: none Visual disturbance: no Chest pain: no Paresthesias: no Glucose Monitoring: yes  Accucheck frequency: occasional  Fasting glucose:   Post prandial:  Evening:  Before meals: Taking Insulin ?: no  Long acting insulin :  Short acting insulin : Blood Pressure Monitoring: not checking Retinal Examination: Not Up To Date -- macular degeneration, [facility] -- needs to schedule Foot Exam: Up to Date Pneumovax: Not up to Date - refuses vaccines Influenza: Not up to Date - refuses vaccines Aspirin: no   ATRIAL FLUTTER & HEART FAILURE Continues Eliquis , Metoprolol , and Diltiazem. EF on 08/15/23 was 20-25%.  Last visit with cardiology 06/26/24, no changes, and HF clinic 08/24/23.  Cardioversion/ablation done 08/15/23.  Atrial fibrillation status: stable Satisfied with current treatment: yes  Medication side effects:  no Medication compliance: good compliance Etiology of atrial fibrillation: unknown Palpitations:  no Chest pain:  no Dyspnea on exertion:  no Orthopnea:  no -- sleeps with one pillow at  baseline Syncope:  no Edema:  at sock line on occasion Ventricular rate control: B-blocker Anti-coagulation: long acting   HYPERTENSION / HYPERLIPIDEMIA Taking Lisinopril , Cardizem, Lasix , Metoprolol , + Crestor .  In past has had occasional low platelets on labs. EF on 01/12/24 56% ,which is improved from previous 30 to 35%. Satisfied with current treatment? yes Duration of hypertension: chronic BP monitoring frequency: not checking BP range:  BP medication side effects: no Past BP meds:  Duration of hyperlipidemia: chronic Aspirin: no Recent stressors: no Recurrent headaches: no Visual changes: no Palpitations: no Dyspnea: no Chest pain: no Lower extremity edema: no Dizzy/lightheaded: no   OCD History of ECT treatments (March 2024).  Takes Fluvoxamine  which has offered him a lot of benefit. Last psychiatry visit 05/21/24 with no changes made. Wife died in their bed years ago so he will never sleep there again, sleeps on couch at baseline. Occasionally will have dreams of his wife. Mood status: stable Satisfied with current treatment?: no Symptom severity: moderate Duration of current treatment : chronic Side effects: no Medication compliance: good compliance Psychotherapy/counseling: yes in the past Depressed mood: no Anxious mood: no Anhedonia: no Significant weight loss or gain: no Insomnia: no Fatigue: no Feelings of worthlessness or guilt: no Impaired concentration/indecisiveness: no Suicidal ideations: no Hopelessness: no Crying spells: no    09/03/2024   10:26 AM 07/02/2024    4:11 PM 05/30/2024   10:58 AM 03/05/2024   10:54 AM 02/26/2024  10:06 AM  Depression screen PHQ 2/9  Decreased Interest 0 0 0 0 0  Down, Depressed, Hopeless 0 0 0 0 0  PHQ - 2 Score 0 0 0 0 0  Altered sleeping 0 0 0 0 0  Tired, decreased energy 0 0 0 0 0  Change in appetite 0 0 0 0 0  Feeling bad or failure about yourself  0 0 0 0 0  Trouble concentrating 0 0 0 0 0  Moving slowly or  fidgety/restless 0 0 0 0 0  Suicidal thoughts 0 0 0 0 0  PHQ-9 Score 0 0  0  0  0   Difficult doing work/chores   Not difficult at all Not difficult at all      Data saved with a previous flowsheet row definition       09/03/2024   10:26 AM 07/02/2024    4:11 PM 05/30/2024   10:58 AM 02/26/2024   10:07 AM  GAD 7 : Generalized Anxiety Score  Nervous, Anxious, on Edge 0 0 0 0  Control/stop worrying 0 0 0 0  Worry too much - different things 0 0 0 0  Trouble relaxing 0 0 0 0  Restless 0 0 0 0  Easily annoyed or irritable 0 0 0 0  Afraid - awful might happen 0 0 0 0  Total GAD 7 Score 0 0 0 0  Anxiety Difficulty   Not difficult at all    Relevant past medical, surgical, family and social history reviewed and updated as indicated. Interim medical history since our last visit reviewed. Allergies and medications reviewed and updated.  Review of Systems  Constitutional:  Negative for activity change, diaphoresis, fatigue and fever.  Respiratory:  Negative for cough, chest tightness, shortness of breath and wheezing.   Cardiovascular:  Negative for chest pain, palpitations and leg swelling.  Gastrointestinal: Negative.   Neurological: Negative.   Psychiatric/Behavioral:  Negative for decreased concentration, self-injury, sleep disturbance and suicidal ideas. The patient is not nervous/anxious.     Per HPI unless specifically indicated above     Objective:    BP 132/78 (BP Location: Left Arm, Patient Position: Sitting, Cuff Size: Normal)   Pulse 72   Temp 98.6 F (37 C) (Oral)   Resp 17   Ht 5' 9.02 (1.753 m)   Wt 216 lb (98 kg)   SpO2 98%   BMI 31.88 kg/m   Wt Readings from Last 3 Encounters:  09/03/24 216 lb (98 kg)  07/02/24 214 lb 6.4 oz (97.3 kg)  05/30/24 214 lb 6.4 oz (97.3 kg)    Physical Exam Vitals and nursing note reviewed.  Constitutional:      General: He is awake. He is not in acute distress.    Appearance: He is well-developed and well-groomed. He is  obese. He is not ill-appearing or toxic-appearing.  HENT:     Head: Normocephalic.     Right Ear: Hearing and external ear normal.     Left Ear: Hearing and external ear normal.  Eyes:     General: Lids are normal.     Extraocular Movements: Extraocular movements intact.     Conjunctiva/sclera: Conjunctivae normal.  Neck:     Thyroid : No thyromegaly.     Vascular: No carotid bruit.  Cardiovascular:     Rate and Rhythm: Normal rate and regular rhythm.     Heart sounds: Normal heart sounds. No murmur heard.    No gallop.  Pulmonary:  Effort: No accessory muscle usage or respiratory distress.     Breath sounds: Normal breath sounds.  Abdominal:     General: Bowel sounds are normal. There is no distension.     Palpations: Abdomen is soft.     Tenderness: There is no abdominal tenderness.  Musculoskeletal:     Cervical back: Full passive range of motion without pain.     Right lower leg: No edema.     Left lower leg: No edema.  Lymphadenopathy:     Cervical: No cervical adenopathy.  Skin:    General: Skin is warm.     Capillary Refill: Capillary refill takes less than 2 seconds.  Neurological:     Mental Status: He is alert and oriented to person, place, and time.     Deep Tendon Reflexes: Reflexes are normal and symmetric.     Reflex Scores:      Brachioradialis reflexes are 2+ on the right side and 2+ on the left side.      Patellar reflexes are 2+ on the right side and 2+ on the left side. Psychiatric:        Attention and Perception: Attention normal.        Mood and Affect: Mood normal.        Speech: Speech normal.        Behavior: Behavior normal. Behavior is cooperative.        Thought Content: Thought content normal.       03/05/2024   11:00 AM 04/14/2023   10:39 AM 02/28/2023   11:34 AM 02/10/2022   11:05 AM 02/08/2021   11:24 AM  6CIT Screen  What Year? 0 points 0 points 0 points 0 points 0 points  What month? 3 points 0 points 0 points 0 points 0 points   What time? 0 points 0 points 0 points 0 points 0 points  Count back from 20 0 points 0 points 0 points 0 points 0 points  Months in reverse 0 points 0 points 2 points 2 points 0 points  Repeat phrase 4 points 0 points 0 points 4 points 4 points  Total Score 7 points 0 points 2 points 6 points 4 points   Results for orders placed or performed in visit on 07/01/24  CBC with Differential/Platelet   Collection Time: 07/01/24  9:30 AM  Result Value Ref Range   WBC 9.6 3.4 - 10.8 x10E3/uL   RBC 5.86 (H) 4.14 - 5.80 x10E6/uL   Hemoglobin 18.3 (H) 13.0 - 17.7 g/dL   Hematocrit 44.7 (H) 62.4 - 51.0 %   MCV 94 79 - 97 fL   MCH 31.2 26.6 - 33.0 pg   MCHC 33.2 31.5 - 35.7 g/dL   RDW 86.6 88.3 - 84.5 %   Platelets 135 (L) 150 - 450 x10E3/uL   Neutrophils 57 Not Estab. %   Lymphs 35 Not Estab. %   Monocytes 7 Not Estab. %   Eos 1 Not Estab. %   Basos 0 Not Estab. %   Neutrophils Absolute 5.4 1.4 - 7.0 x10E3/uL   Lymphocytes Absolute 3.4 (H) 0.7 - 3.1 x10E3/uL   Monocytes Absolute 0.7 0.1 - 0.9 x10E3/uL   EOS (ABSOLUTE) 0.1 0.0 - 0.4 x10E3/uL   Basophils Absolute 0.0 0.0 - 0.2 x10E3/uL   Immature Granulocytes 0 Not Estab. %   Immature Grans (Abs) 0.0 0.0 - 0.1 x10E3/uL      Assessment & Plan:   Problem List Items Addressed This Visit  Cardiovascular and Mediastinum   Typical atrial flutter (HCC)   Diagnosed on 07/17/23.  Following with cardiology with successful cardioversion on 08/15/23.  Continue collaboration with cardiology and current medication regimen as ordered.  Recommend he bring medications to all visits.      Relevant Medications   metoprolol  succinate (TOPROL -XL) 100 MG 24 hr tablet   Chronic systolic heart failure (HCC)   Ongoing, followed by cardiology and HF clinic.  Suspect related to Atrial Flutter which was diagnosed in October 2024.  Continue current medication regimen and adjust as needed.  Euvolemic today. EF on 01/12/24 56% ,which is improved from previous  30 to 35%. - Reminded to call for an overnight weight gain of >2 pounds or a weekly weight gain of >5 pounds - not adding salt to food and read food labels. Reviewed the importance of keeping daily sodium intake to 2000mg  daily.  - Avoid Ibuprofen products - Highly recommend he ensure to follow-up with cardiology as scheduled.      Relevant Medications   metoprolol  succinate (TOPROL -XL) 100 MG 24 hr tablet     Endocrine   Type 2 diabetes mellitus with hyperglycemia, without long-term current use of insulin  [facility]) - Primary   Diagnosed December 2022, A1c 9.9% at the time.  A1c 7.9% today, trending down from 8.9% with Rybelsus  on board. Urine ALB 02 December 2023.  Did not tolerate Metformin  in past, severe diarrhea. Tolerating Rybelsus  well and sugars slowly trending down, will increase to max dose of 14 MG. Educated him on this, he will complete 7 MG tablets by taking two of these daily and then start 14 MG tablet. - Continue Jardiance  25 MG and Rybelsus .  He would prefer not to use injections unless needed, we discussed that we may need to change to injection form of Semaglutide  and may even need insulin  in the future.   - Recommend he check BS at home daily and document for next visit so provider can review, gave him a log to fill out last visit but he did not bring today.  Focus on diabetic diet.  LABS: CMP.  Bring all pill bottles to next visit.  - Collaborate with [facility] - Refuses vaccines - Foot exam up to date and needs eye exam -- recommend he schedule ASAP - ACE and statin on board      Relevant Medications   Semaglutide  (RYBELSUS ) 14 MG TABS   Other Relevant Orders   Bayer DCA Hb A1c Waived   Hyperlipidemia associated with type 2 diabetes mellitus (HCC)   Chronic, ongoing.  Will continue Rosuvastatin  10 MG daily as is tolerating and monitor, adjust as needed.  Lipid panel and CMP today.      Relevant Medications   metoprolol  succinate (TOPROL -XL) 100 MG 24 hr  tablet   Semaglutide  (RYBELSUS ) 14 MG TABS   Other Relevant Orders   Bayer DCA Hb A1c Waived   Comprehensive metabolic panel with GFR   Lipid Panel w/o Chol/HDL Ratio   Diabetes mellitus treated with oral medication (HCC)   Refer to diabetes with hyperglycemia plan of care.      Relevant Medications   Semaglutide  (RYBELSUS ) 14 MG TABS   Other Relevant Orders   Bayer DCA Hb A1c Waived     Other   Severe recurrent major depression without psychotic features (HCC)   Chronic, ongoing.  Denies SI/HI.  Overall mood stable per patient.  Will continue current medication regimen and collaboration with psychiatry.  Obtain EKG annually  for psychiatry, will perform at next visit, suspect had one recently with cardiology but not available on chart.      Relevant Medications   fluvoxaMINE  (LUVOX ) 100 MG tablet   OCD (obsessive compulsive disorder)   Refer to depression plan of care.      Class 1 obesity due to excess calories with serious comorbidity and body mass index (BMI) of 31.0 to 31.9 in adult   BMI 31.88, no loss at present.  Recommended eating smaller high protein, low fat meals more frequently and exercising 30 mins a day 5 times a week with a goal of 10-15lb weight loss in the next 3 months. Patient voiced their understanding and motivation to adhere to these recommendations.       Relevant Medications   Semaglutide  (RYBELSUS ) 14 MG TABS     Follow up plan: Return in about 3 months (around 12/04/2024) for T2DM, HTN/HLD, A-FIB, DEPRESSION.

## 2024-09-03 NOTE — Assessment & Plan Note (Signed)
 Refer to depression plan of care.

## 2024-09-03 NOTE — Assessment & Plan Note (Signed)
Refer to diabetes with hyperglycemia plan of care.

## 2024-09-04 ENCOUNTER — Ambulatory Visit: Payer: Self-pay | Admitting: Nurse Practitioner

## 2024-09-04 LAB — COMPREHENSIVE METABOLIC PANEL WITH GFR
ALT: 43 IU/L (ref 0–44)
AST: 22 IU/L (ref 0–40)
Albumin: 4.8 g/dL (ref 3.8–4.8)
Alkaline Phosphatase: 137 IU/L — ABNORMAL HIGH (ref 47–123)
BUN/Creatinine Ratio: 17 (ref 10–24)
BUN: 19 mg/dL (ref 8–27)
Bilirubin Total: 0.9 mg/dL (ref 0.0–1.2)
CO2: 21 mmol/L (ref 20–29)
Calcium: 10.5 mg/dL — ABNORMAL HIGH (ref 8.6–10.2)
Chloride: 104 mmol/L (ref 96–106)
Creatinine, Ser: 1.1 mg/dL (ref 0.76–1.27)
Globulin, Total: 1.8 g/dL (ref 1.5–4.5)
Glucose: 249 mg/dL — ABNORMAL HIGH (ref 70–99)
Potassium: 4.5 mmol/L (ref 3.5–5.2)
Sodium: 143 mmol/L (ref 134–144)
Total Protein: 6.6 g/dL (ref 6.0–8.5)
eGFR: 72 mL/min/1.73 (ref >59)

## 2024-09-04 LAB — LIPID PANEL W/O CHOL/HDL RATIO
Cholesterol, Total: 140 mg/dL (ref 100–199)
HDL: 41 mg/dL (ref >39)
LDL Chol Calc (NIH): 67 mg/dL (ref 0–99)
Triglycerides: 195 mg/dL — ABNORMAL HIGH (ref 0–149)
VLDL Cholesterol Cal: 32 mg/dL (ref 5–40)

## 2024-09-04 NOTE — Progress Notes (Signed)
 Good morning, please let Dillon [name] know his labs have returned: - Kidney and liver function are normal range. - Calcium  remains a little elevated, try to cut back a little on calcium  rich foods or drinks. - Lipid panel showing LDL close to goal. Triglycerides remain a little elevated. Continue Rosuvastatin  daily. Any questions? Keep being stellar!!  Thank you for allowing me to participate in your care.  I appreciate you. Kindest regards, Vlada Uriostegui

## 2024-09-12 NOTE — Progress Notes (Unsigned)
   09/16/24 Name: Dillon [name] MRN: [id number] DOB: 1951-06-05  Outreach attempt for scheduled telephone follow-up visit was not successful, and I was not able to leave a message due to voicemail not being set up.  I will attempt to contact the patient later this week.  [name], PharmD, DPLA

## 2024-09-16 ENCOUNTER — Other Ambulatory Visit: Payer: Self-pay

## 2024-09-19 ENCOUNTER — Telehealth: Payer: Self-pay | Admitting: Pharmacy Technician

## 2024-09-19 ENCOUNTER — Other Ambulatory Visit (HOSPITAL_COMMUNITY): Payer: Self-pay

## 2024-09-19 ENCOUNTER — Other Ambulatory Visit: Payer: Self-pay

## 2024-09-19 DIAGNOSIS — E1165 Type 2 diabetes mellitus with hyperglycemia: Secondary | ICD-10-CM

## 2024-09-19 DIAGNOSIS — E1169 Type 2 diabetes mellitus with other specified complication: Secondary | ICD-10-CM

## 2024-09-19 NOTE — Telephone Encounter (Signed)
 Pharmacy Patient Advocate Encounter   Received notification from Onbase that prior authorization for Rybelsus  7MG  tablets  is due for renewal.   Insurance verification completed.   The patient is insured through [place].  Action: PA required; PA started via CoverMyMeds. KEY [id number] . Waiting for clinical questions to populate.

## 2024-09-19 NOTE — Telephone Encounter (Signed)
 Pharmacy Patient Advocate Encounter  Received notification from [employer] that Prior Authorization for Rybelsus  7MG  tablets has been APPROVED from 07/17/24 to 10/16/25   PA #/Case ID/Reference #:

## 2024-09-19 NOTE — Progress Notes (Unsigned)
 09/19/24 Name: Dillon [name] MRN: [id number] DOB: [birth year]  Dillon [name] is a 73 y.o. year old male who presented for a telephone visit.   They were referred to the pharmacist by their PCP for assistance in managing diabetes.   Subjective:  Care Team: Primary Care Provider: Valerio Melanie DASEN, NP ; Next Scheduled Visit: 2/20  Medication Access/Adherence  Current Pharmacy:  [id number] - [place], [place] - [address] AT [facility] OF [address] [place] [address] Phone: [phone] Fax: [phone]  -Patient reports affordability concerns with their medications: No  -Patient reports access/transportation concerns to their pharmacy: No  -Patient reports adherence concerns with their medications:  No    Diabetes: Current medications: Rybelsus  7mg  daily, Jardiance  25mg  daily -Medications tried in the past: metformin  caused significant diarrhea -Januvia  was stopped and Rybelsus  started for additional A1c benefit (preferred oral tablet over weekly injection) -Rybelsus  was recently increased to 14mg  daily, but insurance would not cover based on last time 7mg  was filled.  Patient has continued to take 7mg  daily. -Patient is checking home BG on occasion and states these are usually around 200 -Patient denies hypoglycemic s/sx including dizziness, shakiness, sweating.  -Patient denies hyperglycemic symptoms including polyuria, polydipsia, polyphagia, nocturia, neuropathy, blurred vision. -Patient endorses good adherence to medications and no barriers to adherence; pharmacy fill report supports this in regard to Jardiance  and Rybelsus   Macrovascular and Microvascular Risk Reduction:  Statin? yes (rosuvastatin  10mg ) ACEi/ARB? yes (no)  Lisinopril  5mg  daily is on patient's medication list, but he does not have this medication and there is no fill history for it at least in the past year.  Last OV BP was 132/78, and patient does not monitor  home BP Last urinary albumin/creatinine ratio:  Lab Results  Component Value Date   MICRALBCREAT 23 11/21/2023   MICRALBCREAT >300 (H) 01/13/2023   MICRALBCREAT 114 (H) 12/31/2021   MICRALBCREAT 30-300 (H) 09/28/2021   Last eye exam:  Lab Results  Component Value Date   HMDIABEYEEXA No Retinopathy 11/22/2021   Last foot exam: No foot exam found Tobacco Use:  Tobacco Use: Low Risk  (09/03/2024)   Patient History    Smoking Tobacco Use: Never    Smokeless Tobacco Use: Never    Passive Exposure: Past   Objective:  Lab Results  Component Value Date   HGBA1C 7.9 (H) 09/03/2024   Lab Results  Component Value Date   CREATININE 1.10 09/03/2024   BUN 19 09/03/2024   NA 143 09/03/2024   K 4.5 09/03/2024   CL 104 09/03/2024   CO2 21 09/03/2024   Lab Results  Component Value Date   CHOL 140 09/03/2024   HDL 41 09/03/2024   LDLCALC 67 09/03/2024   TRIG 195 (H) 09/03/2024   CHOLHDL 4.6 03/27/2017   Assessment/Plan:   Diabetes: -A1c is not at goal of <7% but has improved, and I expect continued improvement with increasing Rybelsus  to 14mg  daily -Test claim reflects insurance will now cover Rybelsus  14mg  daily at $0; contacting [facility] to refill this -Stop Rybelsus  7mg  and start 14mg  daily, continue Jardiance  25mg  daily -I recommend resuming lisinopril  but at 2.5mg  daily since he has not had this medication in at least 1 year and last BP was only slightly elevated.  Order pending for PCP to sign if in agreemement -UACR and LDL at goal -Continue to monitor home BG -Sees PCP again 2/20 and will be due for  A1c, CMP, UACR, and lipid panel -It appears he may also be due for diabetic foot exam and eye exam  Follow Up Plan: 1 month to check on tolerance/efficacy of Rybelsus  14mg  daily  [name], PharmD, DPLA

## 2024-09-20 ENCOUNTER — Other Ambulatory Visit: Payer: Self-pay | Admitting: Nurse Practitioner

## 2024-09-20 MED ORDER — ACCU-CHEK SOFTCLIX LANCETS MISC: 12 refills | Status: DC

## 2024-09-20 MED ORDER — LISINOPRIL 2.5 MG PO TABS: 2.5000 mg | ORAL_TABLET | Freq: Every day | ORAL | 4 refills | Status: AC

## 2024-09-20 MED ORDER — VITAMIN D (ERGOCALCIFEROL) 1.25 MG (50000 UNIT) PO CAPS: 50000.0000 [IU] | ORAL_CAPSULE | ORAL | 4 refills | Status: AC
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
## 2024-09-24 ENCOUNTER — Ambulatory Visit: Admitting: Psychiatry

## 2024-09-26 ENCOUNTER — Other Ambulatory Visit: Payer: Self-pay | Admitting: Nurse Practitioner

## 2024-09-26 ENCOUNTER — Other Ambulatory Visit: Payer: Self-pay

## 2024-09-26 MED ORDER — ACCU-CHEK SOFTCLIX LANCETS MISC: 1.0000 | Freq: Two times a day (BID) | 3 refills | Status: DC

## 2024-09-26 MED ORDER — ACCU-CHEK SOFTCLIX LANCETS MISC: 3 refills | Status: AC

## 2024-09-26 NOTE — Telephone Encounter (Signed)
 Directions fixed on prescription and t'd up for provider to sign off on.

## 2024-09-26 NOTE — Telephone Encounter (Signed)
 Copied from CRM #[id number]. Topic: Clinical - Prescription Issue >> Sep 26, 2024  8:43 AM Wess RAMAN wrote: Reason for CRM: Pharmacy would like the directions and frequecy for Accu-Chek Softclix Lancets lancets.  Pharmacy: [id number] [place], [place] - [address] AT [facility] OF [address] [place] [address] [place] [address] Phone: [phone] Fax: [phone] Hours: Not open 24 hours

## 2024-10-23 ENCOUNTER — Other Ambulatory Visit: Payer: Self-pay

## 2024-10-23 DIAGNOSIS — Z7984 Long term (current) use of oral hypoglycemic drugs: Secondary | ICD-10-CM

## 2024-10-23 DIAGNOSIS — E1169 Type 2 diabetes mellitus with other specified complication: Secondary | ICD-10-CM

## 2024-10-23 DIAGNOSIS — E1165 Type 2 diabetes mellitus with hyperglycemia: Secondary | ICD-10-CM

## 2024-10-23 NOTE — Progress Notes (Signed)
" ° °  10/23/24 Name: Dillon [name] MRN: [id number] DOB: January 04, 1951  Dillon [name] is a 74 y.o. year old male who presented for a telephone follow-up visit.   They were referred to the pharmacist by their PCP for assistance in managing diabetes.   Subjective:  Care Team: Primary Care Provider: Valerio Melanie DASEN, NP ; Next Scheduled Visit: 12/06/24  Diabetes: Current medications: Rybelsus  14mg  daily, Jardiance  25mg  daily -Medications tried in the past: metformin  caused significant diarrhea -Januvia  was stopped and Rybelsus  started for additional A1c benefit (preferred oral tablet over weekly injection) -Rybelsus  was recently increased to 14mg  daily, and patient states he is tolerating with medication well with no ADE's. -Patient has not been checking home BG regularly and states he needs a refill on test strips.  He also states his glucometer is old an unsure of accuracy. -Patient denies hypoglycemic s/sx including dizziness, shakiness, sweating.  -Patient denies hyperglycemic symptoms including polyuria, polydipsia, polyphagia, nocturia, neuropathy, blurred vision. -Patient endorses good adherence to medications and no barriers to adherence; pharmacy fill report supports this in regard to Jardiance  and Rybelsus   Macrovascular and Microvascular Risk Reduction:  Statin? yes (rosuvastatin  10mg ) ACEi/ARB? yes (no)  Lisinopril  5mg  daily resumed approximately 1 month ago Last urinary albumin/creatinine ratio:  Lab Results  Component Value Date   MICRALBCREAT 23 11/21/2023   MICRALBCREAT >300 (H) 01/13/2023   MICRALBCREAT 114 (H) 12/31/2021   MICRALBCREAT 30-300 (H) 09/28/2021   Last eye exam:  Lab Results  Component Value Date   HMDIABEYEEXA No Retinopathy 11/22/2021   Last foot exam: No foot exam found Tobacco Use:  Tobacco Use: Low Risk (09/03/2024)   Patient History    Smoking Tobacco Use: Never    Smokeless Tobacco Use: Never    Passive Exposure: Past    Objective:  Lab Results  Component Value Date   HGBA1C 7.9 (H) 09/03/2024   Lab Results  Component Value Date   CREATININE 1.10 09/03/2024   BUN 19 09/03/2024   NA 143 09/03/2024   K 4.5 09/03/2024   CL 104 09/03/2024   CO2 21 09/03/2024   Lab Results  Component Value Date   CHOL 140 09/03/2024   HDL 41 09/03/2024   LDLCALC 67 09/03/2024   TRIG 195 (H) 09/03/2024   CHOLHDL 4.6 03/27/2017   Assessment/Plan:   Diabetes: -A1c is not at goal of <7% but has improved, and I expect continued improvement with further increased dosing of Rybelsus  -Test claim reflects insurance will cover test strips and new glucometer at $0 copay; orders sent under [facility] standing order -Recommend patient begin checking home BG at least once daily 1st thing in the morning before breakfast -Continue current medication regimen at this time -UACR and LDL at goal -Sees PCP again 2/20 and will be due for A1c, CMP, UACR, and lipid panel -It appears he may also be due for diabetic foot exam and eye exam  Follow Up Plan: 2/4 to check on home BG readings  [name], PharmD, DPLA    "

## 2024-10-24 MED ORDER — ACCU-CHEK GUIDE TEST VI STRP: ORAL_STRIP | 12 refills | Status: AC

## 2024-10-24 MED ORDER — ACCU-CHEK GUIDE ME W/DEVICE KIT: PACK | 0 refills | Status: AC
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
## 2024-11-18 ENCOUNTER — Ambulatory Visit: Admitting: Psychiatry

## 2024-11-19 NOTE — Progress Notes (Unsigned)
" ° °  10/23/24 Name: Dillon [name] MRN: [id number] DOB: January 04, 1951  Dillon [name] is a 74 y.o. year old male who presented for a telephone follow-up visit.   They were referred to the pharmacist by their PCP for assistance in managing diabetes.   Subjective:  Care Team: Primary Care Provider: Valerio Melanie DASEN, NP ; Next Scheduled Visit: 12/06/24  Diabetes: Current medications: Rybelsus  14mg  daily, Jardiance  25mg  daily -Medications tried in the past: metformin  caused significant diarrhea -Januvia  was stopped and Rybelsus  started for additional A1c benefit (preferred oral tablet over weekly injection) -Rybelsus  was recently increased to 14mg  daily, and patient states he is tolerating with medication well with no ADE's. -Patient has not been checking home BG regularly and states he needs a refill on test strips.  He also states his glucometer is old an unsure of accuracy. -Patient denies hypoglycemic s/sx including dizziness, shakiness, sweating.  -Patient denies hyperglycemic symptoms including polyuria, polydipsia, polyphagia, nocturia, neuropathy, blurred vision. -Patient endorses good adherence to medications and no barriers to adherence; pharmacy fill report supports this in regard to Jardiance  and Rybelsus   Macrovascular and Microvascular Risk Reduction:  Statin? yes (rosuvastatin  10mg ) ACEi/ARB? yes (no)  Lisinopril  5mg  daily resumed approximately 1 month ago Last urinary albumin/creatinine ratio:  Lab Results  Component Value Date   MICRALBCREAT 23 11/21/2023   MICRALBCREAT >300 (H) 01/13/2023   MICRALBCREAT 114 (H) 12/31/2021   MICRALBCREAT 30-300 (H) 09/28/2021   Last eye exam:  Lab Results  Component Value Date   HMDIABEYEEXA No Retinopathy 11/22/2021   Last foot exam: No foot exam found Tobacco Use:  Tobacco Use: Low Risk (09/03/2024)   Patient History    Smoking Tobacco Use: Never    Smokeless Tobacco Use: Never    Passive Exposure: Past    Objective:  Lab Results  Component Value Date   HGBA1C 7.9 (H) 09/03/2024   Lab Results  Component Value Date   CREATININE 1.10 09/03/2024   BUN 19 09/03/2024   NA 143 09/03/2024   K 4.5 09/03/2024   CL 104 09/03/2024   CO2 21 09/03/2024   Lab Results  Component Value Date   CHOL 140 09/03/2024   HDL 41 09/03/2024   LDLCALC 67 09/03/2024   TRIG 195 (H) 09/03/2024   CHOLHDL 4.6 03/27/2017   Assessment/Plan:   Diabetes: -A1c is not at goal of <7% but has improved, and I expect continued improvement with further increased dosing of Rybelsus  -Test claim reflects insurance will cover test strips and new glucometer at $0 copay; orders sent under [facility] standing order -Recommend patient begin checking home BG at least once daily 1st thing in the morning before breakfast -Continue current medication regimen at this time -UACR and LDL at goal -Sees PCP again 2/20 and will be due for A1c, CMP, UACR, and lipid panel -It appears he may also be due for diabetic foot exam and eye exam  Follow Up Plan: 2/4 to check on home BG readings  Dillon [name], PharmD, DPLA    "

## 2024-11-20 ENCOUNTER — Other Ambulatory Visit: Payer: Self-pay

## 2024-11-20 DIAGNOSIS — E1165 Type 2 diabetes mellitus with hyperglycemia: Secondary | ICD-10-CM

## 2024-11-20 DIAGNOSIS — E1169 Type 2 diabetes mellitus with other specified complication: Secondary | ICD-10-CM
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
## 2024-11-25 ENCOUNTER — Ambulatory Visit: Admitting: Psychiatry

## 2024-12-06 ENCOUNTER — Ambulatory Visit: Admitting: Nurse Practitioner

## 2025-01-01 ENCOUNTER — Other Ambulatory Visit

## 2025-03-11 ENCOUNTER — Ambulatory Visit
